# Patient Record
Sex: Female | Born: 1935 | Race: Asian | Hispanic: No | Smoking: Former smoker
Health system: Southern US, Community
[De-identification: ages and names within clinical notes are randomized; demographics above are authoritative.]

## PROBLEM LIST (undated history)

## (undated) DIAGNOSIS — I509 Heart failure, unspecified: Secondary | ICD-10-CM

## (undated) DIAGNOSIS — M810 Age-related osteoporosis without current pathological fracture: Secondary | ICD-10-CM

## (undated) DIAGNOSIS — I420 Dilated cardiomyopathy: Secondary | ICD-10-CM

## (undated) DIAGNOSIS — D649 Anemia, unspecified: Secondary | ICD-10-CM

## (undated) DIAGNOSIS — E039 Hypothyroidism, unspecified: Secondary | ICD-10-CM

## (undated) DIAGNOSIS — K219 Gastro-esophageal reflux disease without esophagitis: Secondary | ICD-10-CM

## (undated) DIAGNOSIS — E785 Hyperlipidemia, unspecified: Secondary | ICD-10-CM

## (undated) DIAGNOSIS — E119 Type 2 diabetes mellitus without complications: Secondary | ICD-10-CM

## (undated) DIAGNOSIS — I10 Essential (primary) hypertension: Secondary | ICD-10-CM

## (undated) DIAGNOSIS — N289 Disorder of kidney and ureter, unspecified: Secondary | ICD-10-CM

## (undated) HISTORY — PX: COLONOSCOPY: SHX174

## (undated) HISTORY — PX: TONSILLECTOMY: SUR1361

## (undated) HISTORY — PX: BUNIONECTOMY: SHX129

---

## 2004-07-29 ENCOUNTER — Ambulatory Visit: Payer: Self-pay | Admitting: Internal Medicine

## 2005-01-30 ENCOUNTER — Ambulatory Visit: Payer: Self-pay | Admitting: Internal Medicine

## 2005-02-03 ENCOUNTER — Ambulatory Visit: Payer: Self-pay | Admitting: Internal Medicine

## 2006-03-25 ENCOUNTER — Ambulatory Visit: Payer: Self-pay | Admitting: Internal Medicine

## 2006-07-30 ENCOUNTER — Emergency Department: Payer: Self-pay | Admitting: Emergency Medicine

## 2007-06-22 ENCOUNTER — Ambulatory Visit: Payer: Self-pay | Admitting: Internal Medicine

## 2008-06-22 ENCOUNTER — Ambulatory Visit: Payer: Self-pay | Admitting: Internal Medicine

## 2009-10-24 ENCOUNTER — Ambulatory Visit: Payer: Self-pay | Admitting: Internal Medicine

## 2009-11-07 ENCOUNTER — Ambulatory Visit: Payer: Self-pay | Admitting: Internal Medicine

## 2009-11-14 ENCOUNTER — Ambulatory Visit: Payer: Self-pay | Admitting: Internal Medicine

## 2010-06-06 ENCOUNTER — Emergency Department: Payer: Self-pay | Admitting: Emergency Medicine

## 2011-01-25 ENCOUNTER — Emergency Department: Payer: Self-pay | Admitting: Unknown Physician Specialty

## 2011-01-30 ENCOUNTER — Observation Stay: Payer: Self-pay | Admitting: Internal Medicine

## 2011-06-22 LAB — COMPREHENSIVE METABOLIC PANEL
Albumin: 4.5 g/dL (ref 3.4–5.0)
Alkaline Phosphatase: 44 U/L — ABNORMAL LOW (ref 50–136)
Anion Gap: 10 (ref 7–16)
BUN: 18 mg/dL (ref 7–18)
Bilirubin,Total: 0.8 mg/dL (ref 0.2–1.0)
Creatinine: 0.76 mg/dL (ref 0.60–1.30)
Glucose: 247 mg/dL — ABNORMAL HIGH (ref 65–99)
Osmolality: 286 (ref 275–301)
Potassium: 3.8 mmol/L (ref 3.5–5.1)
Sodium: 138 mmol/L (ref 136–145)
Total Protein: 8.3 g/dL — ABNORMAL HIGH (ref 6.4–8.2)

## 2011-06-22 LAB — CBC WITH DIFFERENTIAL/PLATELET
Basophil #: 0 10*3/uL (ref 0.0–0.1)
Eosinophil #: 0.2 10*3/uL (ref 0.0–0.7)
HGB: 15.5 g/dL (ref 12.0–16.0)
Lymphocyte %: 24.1 %
MCH: 32.5 pg (ref 26.0–34.0)
MCV: 94 fL (ref 80–100)
Monocyte #: 0.5 10*3/uL (ref 0.0–0.7)
Monocyte %: 7.9 %
Neutrophil #: 3.8 10*3/uL (ref 1.4–6.5)
Neutrophil %: 64.6 %
Platelet: 135 10*3/uL — ABNORMAL LOW (ref 150–440)
RBC: 4.78 10*6/uL (ref 3.80–5.20)
RDW: 13.1 % (ref 11.5–14.5)
WBC: 5.9 10*3/uL (ref 3.6–11.0)

## 2011-06-22 LAB — TROPONIN I
Troponin-I: <0.02 ng/mL
Troponin-I: 0.03 ng/mL

## 2011-06-22 LAB — URINALYSIS, COMPLETE
Blood: NEGATIVE
Ketone: NEGATIVE
Leukocyte Esterase: NEGATIVE
Nitrite: NEGATIVE
Ph: 7 (ref 4.5–8.0)
Protein: 100
Specific Gravity: 1.006 (ref 1.003–1.030)
WBC UR: 1 /HPF (ref 0–5)

## 2011-06-22 LAB — PRO B NATRIURETIC PEPTIDE: B-Type Natriuretic Peptide: 302 pg/mL (ref 0–450)

## 2011-06-23 ENCOUNTER — Inpatient Hospital Stay: Payer: Self-pay | Admitting: Internal Medicine

## 2011-06-23 LAB — CK TOTAL AND CKMB (NOT AT ARMC)
CK, Total: 56 U/L (ref 21–215)
CK, Total: 55 U/L (ref 21–215)
CK-MB: 0.9 ng/mL (ref 0.5–3.6)
CK-MB: 0.7 ng/mL (ref 0.5–3.6)

## 2011-06-23 LAB — TROPONIN I
Troponin-I: 0.02 ng/mL
Troponin-I: <0.02 ng/mL

## 2011-06-24 LAB — CBC WITH DIFFERENTIAL/PLATELET
Basophil #: 0 10*3/uL (ref 0.0–0.1)
Basophil %: 0.2 %
Eosinophil %: 2.5 %
HCT: 42.2 % (ref 35.0–47.0)
Lymphocyte #: 1.5 10*3/uL (ref 1.0–3.6)
MCH: 32.5 pg (ref 26.0–34.0)
MCHC: 34.5 g/dL (ref 32.0–36.0)
Monocyte #: 0.6 10*3/uL (ref 0.0–0.7)
Monocyte %: 9.4 %
Neutrophil #: 4.2 10*3/uL (ref 1.4–6.5)
Platelet: 111 10*3/uL — ABNORMAL LOW (ref 150–440)
RDW: 13.4 % (ref 11.5–14.5)
WBC: 6.5 10*3/uL (ref 3.6–11.0)

## 2011-06-24 LAB — BASIC METABOLIC PANEL
BUN: 18 mg/dL (ref 7–18)
Calcium, Total: 9.2 mg/dL (ref 8.5–10.1)
Creatinine: 0.66 mg/dL (ref 0.60–1.30)
EGFR (African American): >60
EGFR (Non-African Amer.): >60
Glucose: 208 mg/dL — ABNORMAL HIGH (ref 65–99)
Osmolality: 289 (ref 275–301)
Potassium: 3.9 mmol/L (ref 3.5–5.1)
Sodium: 141 mmol/L (ref 136–145)

## 2011-06-25 LAB — BASIC METABOLIC PANEL
Anion Gap: 10 (ref 7–16)
Calcium, Total: 9.5 mg/dL (ref 8.5–10.1)
Co2: 28 mmol/L (ref 21–32)
EGFR (African American): >60
EGFR (Non-African Amer.): >60
Glucose: 196 mg/dL — ABNORMAL HIGH (ref 65–99)
Osmolality: 289 (ref 275–301)
Sodium: 141 mmol/L (ref 136–145)

## 2011-06-25 LAB — CBC WITH DIFFERENTIAL/PLATELET
Basophil %: 0.3 %
Eosinophil #: 0.2 10*3/uL (ref 0.0–0.7)
Eosinophil %: 3.7 %
HCT: 41.1 % (ref 35.0–47.0)
Lymphocyte %: 37.3 %
MCHC: 33.9 g/dL (ref 32.0–36.0)
Monocyte %: 11.9 %
Neutrophil %: 46.8 %
RBC: 4.35 10*6/uL (ref 3.80–5.20)
RDW: 13.1 % (ref 11.5–14.5)
WBC: 4.8 10*3/uL (ref 3.6–11.0)

## 2011-09-01 ENCOUNTER — Ambulatory Visit: Payer: Self-pay | Admitting: Internal Medicine

## 2012-09-01 ENCOUNTER — Ambulatory Visit: Payer: Self-pay | Admitting: Internal Medicine

## 2013-02-19 ENCOUNTER — Inpatient Hospital Stay: Payer: Self-pay | Admitting: Internal Medicine

## 2013-02-19 LAB — CBC
HCT: 37.2 % (ref 35.0–47.0)
MCHC: 32.8 g/dL (ref 32.0–36.0)
MCV: 83 fL (ref 80–100)
RBC: 4.48 10*6/uL (ref 3.80–5.20)
WBC: 8.5 10*3/uL (ref 3.6–11.0)

## 2013-02-19 LAB — PRO B NATRIURETIC PEPTIDE: B-Type Natriuretic Peptide: 529 pg/mL — ABNORMAL HIGH (ref 0–450)

## 2013-02-19 LAB — COMPREHENSIVE METABOLIC PANEL
Albumin: 4.4 g/dL (ref 3.4–5.0)
Alkaline Phosphatase: 49 U/L — ABNORMAL LOW (ref 50–136)
BUN: 21 mg/dL — ABNORMAL HIGH (ref 7–18)
Calcium, Total: 9.1 mg/dL (ref 8.5–10.1)
Chloride: 105 mmol/L (ref 98–107)
Co2: 29 mmol/L (ref 21–32)
EGFR (African American): >60
EGFR (Non-African Amer.): >60
Potassium: 4.4 mmol/L (ref 3.5–5.1)
SGPT (ALT): 30 U/L (ref 12–78)

## 2013-02-19 LAB — TROPONIN I
Troponin-I: 0.21 ng/mL — ABNORMAL HIGH
Troponin-I: 0.12 ng/mL — ABNORMAL HIGH

## 2013-02-19 LAB — CK-MB
CK-MB: 1.9 ng/mL (ref 0.5–3.6)
CK-MB: 1.6 ng/mL (ref 0.5–3.6)

## 2013-02-20 LAB — BASIC METABOLIC PANEL
BUN: 19 mg/dL — ABNORMAL HIGH (ref 7–18)
Creatinine: 0.72 mg/dL (ref 0.60–1.30)
EGFR (Non-African Amer.): >60
Glucose: 135 mg/dL — ABNORMAL HIGH (ref 65–99)
Potassium: 3.8 mmol/L (ref 3.5–5.1)
Sodium: 138 mmol/L (ref 136–145)

## 2013-02-20 LAB — CBC WITH DIFFERENTIAL/PLATELET
Basophil #: 0 10*3/uL (ref 0.0–0.1)
Basophil %: 0.4 %
MCHC: 33.3 g/dL (ref 32.0–36.0)
Monocyte #: 0.4 x10 3/mm (ref 0.2–0.9)
Neutrophil #: 3.3 10*3/uL (ref 1.4–6.5)
Neutrophil %: 61.1 %
RBC: 4.05 10*6/uL (ref 3.80–5.20)
RDW: 15.2 % — ABNORMAL HIGH (ref 11.5–14.5)
WBC: 5.4 10*3/uL (ref 3.6–11.0)

## 2013-02-21 LAB — CBC WITH DIFFERENTIAL/PLATELET
Basophil #: 0 10*3/uL (ref 0.0–0.1)
Basophil %: 0.5 %
HGB: 10.8 g/dL — ABNORMAL LOW (ref 12.0–16.0)
Lymphocyte #: 1.6 10*3/uL (ref 1.0–3.6)
Lymphocyte %: 36.1 %
MCHC: 32.7 g/dL (ref 32.0–36.0)
MCV: 82 fL (ref 80–100)
Monocyte #: 0.5 x10 3/mm (ref 0.2–0.9)
Neutrophil %: 49 %
Platelet: 155 10*3/uL (ref 150–440)
RDW: 15.1 % — ABNORMAL HIGH (ref 11.5–14.5)
WBC: 4.4 10*3/uL (ref 3.6–11.0)

## 2013-02-21 LAB — BASIC METABOLIC PANEL
Calcium, Total: 8.8 mg/dL (ref 8.5–10.1)
Chloride: 106 mmol/L (ref 98–107)
Co2: 29 mmol/L (ref 21–32)
Creatinine: 0.69 mg/dL (ref 0.60–1.30)
EGFR (African American): >60
EGFR (Non-African Amer.): >60
Glucose: 133 mg/dL — ABNORMAL HIGH (ref 65–99)
Sodium: 140 mmol/L (ref 136–145)

## 2013-02-22 LAB — CBC WITH DIFFERENTIAL/PLATELET
Basophil #: 0 10*3/uL (ref 0.0–0.1)
Eosinophil #: 0.2 10*3/uL (ref 0.0–0.7)
Eosinophil %: 3.4 %
HCT: 34.8 % — ABNORMAL LOW (ref 35.0–47.0)
HGB: 11.5 g/dL — ABNORMAL LOW (ref 12.0–16.0)
MCH: 27.1 pg (ref 26.0–34.0)
WBC: 5 10*3/uL (ref 3.6–11.0)

## 2013-02-22 LAB — BASIC METABOLIC PANEL
BUN: 18 mg/dL (ref 7–18)
Calcium, Total: 9 mg/dL (ref 8.5–10.1)
Chloride: 104 mmol/L (ref 98–107)
Co2: 28 mmol/L (ref 21–32)
EGFR (Non-African Amer.): >60
Osmolality: 278 (ref 275–301)

## 2013-02-23 LAB — CBC WITH DIFFERENTIAL/PLATELET
Eosinophil #: 0.2 10*3/uL (ref 0.0–0.7)
Lymphocyte #: 2 10*3/uL (ref 1.0–3.6)
Lymphocyte %: 39.6 %
MCH: 27.9 pg (ref 26.0–34.0)
MCV: 81 fL (ref 80–100)
Monocyte #: 0.5 x10 3/mm (ref 0.2–0.9)
Neutrophil #: 2.2 10*3/uL (ref 1.4–6.5)
Neutrophil %: 45.4 %
Platelet: 152 10*3/uL (ref 150–440)
RBC: 4.44 10*6/uL (ref 3.80–5.20)

## 2013-02-23 LAB — BASIC METABOLIC PANEL
Anion Gap: 5 — ABNORMAL LOW (ref 7–16)
BUN: 25 mg/dL — ABNORMAL HIGH (ref 7–18)
Chloride: 103 mmol/L (ref 98–107)
Co2: 27 mmol/L (ref 21–32)
EGFR (African American): >60
EGFR (Non-African Amer.): >60
Glucose: 141 mg/dL — ABNORMAL HIGH (ref 65–99)
Potassium: 4.1 mmol/L (ref 3.5–5.1)
Sodium: 135 mmol/L — ABNORMAL LOW (ref 136–145)

## 2013-05-04 ENCOUNTER — Ambulatory Visit: Payer: Self-pay | Admitting: Gastroenterology

## 2013-05-05 LAB — PATHOLOGY REPORT

## 2013-09-05 ENCOUNTER — Ambulatory Visit: Payer: Self-pay | Admitting: Internal Medicine

## 2014-06-13 LAB — COMPREHENSIVE METABOLIC PANEL
ALBUMIN: 4.1 g/dL (ref 3.4–5.0)
AST: 38 U/L — AB (ref 15–37)
Alkaline Phosphatase: 35 U/L — ABNORMAL LOW
Anion Gap: 8 (ref 7–16)
BUN: 30 mg/dL — AB (ref 7–18)
Bilirubin,Total: 0.6 mg/dL (ref 0.2–1.0)
CO2: 28 mmol/L (ref 21–32)
Calcium, Total: 9.9 mg/dL (ref 8.5–10.1)
Chloride: 99 mmol/L (ref 98–107)
Creatinine: 1.11 mg/dL (ref 0.60–1.30)
GFR CALC NON AF AMER: 50 — AB
GLUCOSE: 43 mg/dL — AB (ref 65–99)
OSMOLALITY: 273 (ref 275–301)
Potassium: 3.8 mmol/L (ref 3.5–5.1)
SGPT (ALT): 25 U/L
SODIUM: 135 mmol/L — AB (ref 136–145)
Total Protein: 7.8 g/dL (ref 6.4–8.2)

## 2014-06-13 LAB — CBC WITH DIFFERENTIAL/PLATELET
Basophil #: 0 10*3/uL (ref 0.0–0.1)
Basophil %: 0.2 %
Eosinophil #: 0.1 10*3/uL (ref 0.0–0.7)
Eosinophil %: 1 %
HCT: 39.5 % (ref 35.0–47.0)
HGB: 13.1 g/dL (ref 12.0–16.0)
Lymphocyte #: 1.5 10*3/uL (ref 1.0–3.6)
Lymphocyte %: 14.1 %
MCH: 30.9 pg (ref 26.0–34.0)
MCHC: 33.1 g/dL (ref 32.0–36.0)
MCV: 93 fL (ref 80–100)
Monocyte #: 0.8 x10 3/mm (ref 0.2–0.9)
Monocyte %: 7.1 %
Neutrophil #: 8.3 10*3/uL — ABNORMAL HIGH (ref 1.4–6.5)
Neutrophil %: 77.6 %
Platelet: 194 10*3/uL (ref 150–440)
RBC: 4.23 10*6/uL (ref 3.80–5.20)
RDW: 13.5 % (ref 11.5–14.5)
WBC: 10.7 10*3/uL (ref 3.6–11.0)

## 2014-06-13 LAB — URINALYSIS, COMPLETE
BILIRUBIN, UR: NEGATIVE
BLOOD: NEGATIVE
KETONE: NEGATIVE
Nitrite: NEGATIVE
PH: 5 (ref 4.5–8.0)
PROTEIN: NEGATIVE
SPECIFIC GRAVITY: 1.008 (ref 1.003–1.030)

## 2014-06-14 ENCOUNTER — Observation Stay: Payer: Self-pay | Admitting: Internal Medicine

## 2014-06-15 LAB — CBC WITH DIFFERENTIAL/PLATELET
BASOS ABS: 0 10*3/uL (ref 0.0–0.1)
BASOS PCT: 0.3 %
Eosinophil #: 0.2 10*3/uL (ref 0.0–0.7)
Eosinophil %: 3.8 %
HCT: 40.1 % (ref 35.0–47.0)
HGB: 13.4 g/dL (ref 12.0–16.0)
LYMPHS ABS: 1.4 10*3/uL (ref 1.0–3.6)
LYMPHS PCT: 23.1 %
MCH: 31.4 pg (ref 26.0–34.0)
MCHC: 33.5 g/dL (ref 32.0–36.0)
MCV: 94 fL (ref 80–100)
MONO ABS: 0.6 x10 3/mm (ref 0.2–0.9)
MONOS PCT: 10.3 %
NEUTROS PCT: 62.5 %
Neutrophil #: 3.8 10*3/uL (ref 1.4–6.5)
PLATELETS: 206 10*3/uL (ref 150–440)
RBC: 4.28 10*6/uL (ref 3.80–5.20)
RDW: 13.3 % (ref 11.5–14.5)
WBC: 6 10*3/uL (ref 3.6–11.0)

## 2014-06-15 LAB — BASIC METABOLIC PANEL
ANION GAP: 4 — AB (ref 7–16)
BUN: 22 mg/dL — ABNORMAL HIGH (ref 7–18)
Calcium, Total: 9.5 mg/dL (ref 8.5–10.1)
Chloride: 101 mmol/L (ref 98–107)
Co2: 31 mmol/L (ref 21–32)
Creatinine: 0.85 mg/dL (ref 0.60–1.30)
EGFR (African American): >60
EGFR (Non-African Amer.): >60
GLUCOSE: 148 mg/dL — AB (ref 65–99)
OSMOLALITY: 278 (ref 275–301)
Potassium: 4.5 mmol/L (ref 3.5–5.1)
SODIUM: 136 mmol/L (ref 136–145)

## 2014-08-18 DIAGNOSIS — E119 Type 2 diabetes mellitus without complications: Secondary | ICD-10-CM

## 2014-10-06 NOTE — H&P (Signed)
PATIENT NAME:  Jacqueline, [name] MR#:  [id number] DATE OF BIRTH:  Nov 12, 1932  DATE OF ADMISSION:  02/19/2013  PRIMARY CARE PHYSICIAN:  Dr. Tracie Harrier.   REFERRING PHYSICIAN:  Dr. Lurline Hare.   HISTORY OF PRESENT ILLNESS:  Jacqueline [name] is a 81 year old pleasant female with a past medical history of congestive heart failure with EF of 40% to 45% by echocardiogram done in January 2013, diabetes mellitus, hypertension, is brought to the Emergency Department with sudden onset of shortness of breath.  Per daughter, the patient was doing well, even at 5:30, woke up at 11:30 at night with sudden onset of severe shortness of breath.  The patient states at that time experienced some chest pain.  The patient was diaphoretic.  Concerning this, the patient was brought to the Emergency Department.  At the time of EMS arrival the patient had oxygen saturations of 79%.  The patient was placed on nonrebreather and was brought in her.  In the Emergency Department, the patient was placed on BiPAP, was given one dose of Lasix with some improvement of the shortness of breath.  The patient states has eaten in the last two days some salty food and increased amount of fluid intake.  The patient has gained 3 pounds of weight in the last three days.  The patient's usual weight is 127.  Noted to have mild increased swelling around the face.   PAST MEDICAL HISTORY: 1.  Systolic congestive heart failure with EF of 40% to 45%.  2.  Hypertension.  3.  Hyperlipidemia.  4.  Diabetes mellitus.   5.  Vertigo.  6.  History of transaminitis.  7.  Accelerated hypertension.   PAST SURGICAL HISTORY:  1.  Tonsillectomy.  2.  Adenoidectomy.   3.  Bunion surgery from the left big toe.   ALLERGIES:  ERYTHROMYCIN, LOPID, PENICILLIN, CIPRO.   HOME MEDICATIONS: 1.  TriCor 145 mg once a day.  2.  Sertraline 50 mg once a day.  3.  Ramipril 10 mg 2 capsules once a day.  4.  Metformin 500 mg 2 times a day.  5.  Meclizine 25 mg 1/2 tablet  2 times a day.  6.  Lasix 20 mg once a day.  7.  Hydralazine 25 mg 2 times a day.  8.  Glipizide 10 mg once a day.  9.  Carvedilol 12.5 mg 2 times a day.  10.  Aspirin 81 mg once a day.   SOCIAL HISTORY:  Smoked in the past, quit 20 years back.  Denies drinking alcohol or using illicit drugs.  Lives by herself.  Has poor functional status.   FAMILY HISTORY:  The patient's parents died at an early age, does not know about the family history.   REVIEW OF SYSTEMS: CONSTITUTIONAL:  Has generalized weakness.  EYES:  No change in vision.  EARS:  No change in hearing. RESPIRATORY:  Denies having any cough.  Has shortness of breath.  CARDIOVASCULAR:  Has shortness of breath.   GASTROINTESTINAL:  No nausea, vomiting, abdominal pain.  GENITOURINARY:  No dysuria or hematuria.  HEMATOLOGIC:  No easy bruising and bleeding.  ENDOCRINE:  Carries a diagnosis of diabetes mellitus.  SKIN:  No rashes or lesions.  MUSCULOSKELETAL:  No joint pains and aches.   NEUROLOGIC:  No weakness or numbness in any part of the body.   PHYSICAL EXAMINATION: GENERAL:  This is a well-built, well-nourished, age-appropriate female lying down in the bed on BiPAP.  VITAL SIGNS:  Temperature  97.3, pulse 77, blood pressure 138/62, respiratory rate of 26, oxygen saturation 100% on BiPAP.  HEENT:  Head normocephalic, atraumatic.  Eyes, no scleral icterus.  Conjunctivae normal.  Pupils equal and react to light.  Extraocular movements are intact.  Mucous membranes moist.  NECK:  Supple.  No lymphadenopathy.  No JVD.  No carotid bruit.  No thyromegaly.  CHEST:  Has no focal tenderness, a few crackles are heard bibasilar, decreased breath sounds.  HEART:  S1, S2 regular.  No murmurs are heard.  ABDOMEN:  Bowel sounds plus.  Soft, nontender, nondistended.  EXTREMITIES:  No pedal edema.  Pulses 2+.  SKIN:  No rash or lesions.  MUSCULOSKELETAL:  Good range of motion in all the extremities.  NEUROLOGIC:  The patient is alert,  oriented to place, person and time.  Cranial nerves II through XII intact.  No motor and sensory deficits.   LABORATORY DATA:  EKG, 12-lead:  Sinus tachycardia, first degree AV block, nonspecific ST-T wave abnormalities.   Chest x-ray consistent with congestive heart failure.   CMP is completely within normal limits.  Initial troponin is less than 0.02.  BNP 529.  ASSESSMENT AND PLAN:  Jacqueline [name] is a 81 year old female who comes to the Emergency Department with sudden onset of shortness of breath.  1.  Congestive heart failure, systolic, acute on chronic, most likely from fluid overload.  Continue with BiPAP.  Continue with the Lasix IV.  Continue with the metoprolol and the Ramipril.  2.  Chest pain.  We will rule out with cardiac enzymes x 3.  No previous history of heart attacks.  3.  Diabetes mellitus.  We will hold the metformin.  Continue with sliding scale insulin.  4.  Hypertension, currently well-controlled.  Continue the home medications.  5.  Keep the patient on DVT prophylaxis with Lovenox.   TIME SPENT:  45 minutes.    ____________________________ Monica Becton, MD pv:ea D: 02/19/2013 02:25:52 ET T: 02/19/2013 03:15:30 ET JOB#: [id number]  cc: Monica Becton, MD, <Dictator> Tracie Harrier, MD Monica Becton MD ELECTRONICALLY SIGNED 02/20/2013 20:33

## 2014-10-06 NOTE — Consult Note (Signed)
PATIENT NAME:  Jacqueline [name], Jacqueline [name] MR#:  [id number] DATE OF BIRTH:  1932-08-01  DATE OF CONSULTATION:  02/19/2013  REFERRING PHYSICIAN:  Leonie Douglas. Doy Hutching, MD CONSULTING PHYSICIAN:  Corey Skains, MD  REASON FOR CONSULTATION: Acute on chronic congestive heart failure, hypertension and hyperlipidemia.   CHIEF COMPLAINT: "I got short of breath."   HISTORY OF PRESENT ILLNESS: This is a 81 year old female with known chronic LV systolic dysfunction, on appropriate medication management, including beta blocker, ACE inhibitor and Lasix. The patient recently has had an echocardiogram showing moderate global LV systolic dysfunction with ejection fraction of 30%. She now has severe shortness of breath, pulmonary edema, lower extremity edema and acute on chronic systolic dysfunction congestive heart failure. She was given intravenous Lasix, which has helped a great deal, and she now has improvements of these symptoms. Blood pressure and heart rate are stable. She has no evidence of current myocardial infarction, with a normal troponin and CK-MB. EKG shows no evidence of myocardial infarction.   REVIEW OF SYSTEMS: The remainder of review of systems negative for vision change, ringing in the ears, hearing loss, cough, congestion, heartburn, nausea, vomiting, diarrhea, bloody stools, stomach pain, extremity pain, leg weakness, cramping of the buttocks, known blood clots, headaches, blackouts, dizzy spells, nosebleeds, congestion, trouble swallowing, frequent urination, urination at night, muscle weakness, numbness, anxiety, depression, skin lesions or skin rashes.   PAST MEDICAL HISTORY:  1. Dilated cardiomyopathy. 2. Cardiac catheterization with normal coronary arteries.  3. Hypertension.   FAMILY HISTORY: No family members with early onset of cardiovascular disease.   SOCIAL HISTORY: Currently denies alcohol or tobacco use.   ALLERGIES: As listed.   MEDICATIONS: As listed.   PHYSICAL EXAMINATION:   VITAL SIGNS: Blood pressure 110/68 bilaterally, heart rate 72 upright, reclining and regular.  GENERAL: She is a well-appearing female in no acute distress.  HEAD, EYES, EARS, NOSE AND THROAT: No icterus, thyromegaly, ulcers, hemorrhage or xanthelasma.  CARDIOVASCULAR: Regular rate and rhythm with normal S1 and S2, with a 2/6 apical murmur consistent with mitral regurgitation. PMI is diffuse. Carotid upstroke normal without bruits. Jugular venous pressure is normal.  LUNGS: Have a few basilar crackles with normal respirations.  ABDOMEN: Soft, nontender, without hepatosplenomegaly or masses. Abdominal aorta is normal size without bruit.  EXTREMITIES: Show 2+ bilateral pulses in dorsal, pedal, radial and femoral arteries, with 1+ lower extremity edema. No cyanosis, clubbing or ulcers.  NEUROLOGICAL: She is oriented to time, place and person with normal mood and affect.   ASSESSMENT: A 81 year old female with acute on chronic systolic dysfunction, congestive heart failure, pulmonary edema, hypertension, needing further treatment options.   RECOMMENDATIONS:  1. Intravenous Lasix.  2. Consider repeat echocardiogram as necessary for further evaluation of significant acute on chronic congestive heart failure.  3. No change in current outpatient medication management, which has been appropriate. 4. Further evaluation of dietary habits, possibly causing symptoms listed above.  5. Ambulate and further adjustments of medication.     ____________________________ Corey Skains, MD bjk:OSi D: 02/19/2013 09:10:24 ET T: 02/19/2013 09:24:58 ET JOB#: [id number]  cc: Corey Skains, MD, <Dictator> Corey Skains MD ELECTRONICALLY SIGNED 02/23/2013 14:27

## 2014-10-06 NOTE — Discharge Summary (Signed)
PATIENT NAME:  Jacqueline [name], TINO MR#:  [id number] DATE OF BIRTH:  20-Sep-1932  DATE OF ADMISSION:  02/19/2013 DATE OF DISCHARGE:  02/23/2013  DISCHARGE DIAGNOSES:  1.  Acute shortness of breath secondary to acute systolic congestive heart failure.  2.  Type 2 diabetes.  3.  Hypertension.  4.  Hyperlipidemia.  5.  History of vertigo.   CHIEF COMPLAINT: Shortness of breath.   HISTORY OF PRESENT ILLNESS: Myrla Malanowski is a 81 year old female with a past medical history of CHF, ejection fraction 40% to 45% by echocardiogram in 06/2011, history of type 2 diabetes and hypertension, who presented the ED complaining of acute onset of shortness of breath. The patient also had been experiencing some chest pain and was noted to be diaphoretic and O2 sats were down to 79%. The patient was placed on nonrebreather oxygen and brought to the Emergency Room. In the ED, she was placed on BiPAP and also given IV Lasix and appeared to improve her symptoms.   PAST MEDICAL HISTORY: Significant for CHF, ejection fraction 40% to 45%, history of hypertension, hyperlipidemia, diabetes, vertigo, history of transaminitis and history of accelerated hypertension.   PHYSICAL EXAMINATION:  GENERAL: She was well built, well-nourished initially on BiPAP. VITAL SIGNS: Temperature 97.3, pulse 77, blood pressure 138/62, respirations 26, oxygen saturation 100% on BiPAP.  HEENT: NCAT.  NECK: Supple. No JVD.  HEART: S1, S2, regular rhythm.  ABDOMEN: Soft, nontender.  EXTREMITIES: 2+ edema.  NEUROLOGIC: Nonfocal.   EKG showed sinus tachycardia with first degree AV block and nonspecific ST-T abnormalities. Chest x-ray was consistent with CHF.   HOSPITAL COURSE: The patient was admitted to [facility] and placed on intravenous Lasix. She was also seen in consultation by cardiologist, Dr. Nehemiah Massed and an echocardiogram was performed. Echo showed evidence of left ventricular ejection fraction of 25% to 30% by visual estimation, moderately  decreased global LV systolic function, moderate LVH, moderately dilated left atrium, moderately dilated right atrium, moderate to severe mitral regurgitation and mildly increased LV internal cavity size with mild aortic regurgitation and moderate tricuspid regurgitation. During her stay in the hospital, the patient was also placed on oxygen and subsequently seen by physical therapy. She was gradually ambulated and symptomatically she improved. Her leg edema also improved significantly and she was discharged in stable condition on the following medications: 1.  Aspirin 81 mg a day. 2.  Carvedilol 12.5 mg b.i.d.  3.  Furosemide 40 mg b.i.d.  4.  KCl 10 mEq once a day. 5.  Hydralazine 25 mg b.i.d.  6.  Glipizide 10 mg once a day. 7.  Sertraline 50 mg once a day. 8.  Avapro 10 mg once a day. 9.  Meclizine 25 mg 1/2 tablet b.i.d. p.r.n.  10. Metformin 500 mg b.i.d.  11. TriCor 145 mg once a day.   The patient is advised a low sodium diet and advised to have a CBC and metabolic panel in 1 week and to follow with me, Dr. Ginette Pitman, in 1 to 2 weeks' time.  [place] was arranged for pt.  TOTAL TIME SPENT DISCHARGING THIS PATIENT: 35 minutes.  ____________________________ Tracie Harrier, MD vh:aw D: 02/24/2013 13:25:32 ET T: 02/24/2013 13:34:57 ET JOB#: [id number]  cc: Tracie Harrier, MD, <Dictator> Tracie Harrier MD ELECTRONICALLY SIGNED 03/03/2013 18:25

## 2014-10-08 NOTE — Discharge Summary (Signed)
PATIENT NAME:  Jacqueline [name], Jacqueline [name] MR#:  [id number] DATE OF BIRTH:  Jun 09, 1933  DATE OF ADMISSION:  06/23/2011 DATE OF DISCHARGE:  06/25/2011  DIAGNOSES AT TIME OF DISCHARGE:  1. Atypical chest pain, myocardial infarction ruled out with negative Myoview stress test.  2. Accelerated hypertension.  3. Dizziness.  4. Hypoxemia with congestive heart failure.  5. Type 2 diabetes.   CHIEF COMPLAINT: Dizziness, chest pain, shortness of breath, diaphoresis.   HISTORY OF PRESENT ILLNESS: Emmett Arntz is a 81 year old female with history of systolic dysfunction, congestive heart failure, hypertension, hyperlipidemia, diabetes, chronic vertigo presented with dizziness after she had attended church. She also described severe chest pain associated with shortness of breath and related the pain is 7/10. Patient was noted to be hypoxemic with an oxygen saturation of 88% to 90% on room air with an elevated blood pressure of 222/107. Patient stated that she had been eating some beef jerking but denied any syncopal episode.   PAST MEDICAL HISTORY:  1. Systolic dysfunction, ejection fraction of 40%. 2. Hypertension. 3. Hyperlipidemia. 4. Type 2 diabetes. 5. History of previous urinary tract infection.  6. History of chronic vertigo. 7. History of previous tonsillectomy, adenoidectomy. 8. Bunion surgery left big toe.   PHYSICAL EXAMINATION: GENERAL: She is afebrile, pulse 85, respirations 26, blood pressure initially 205/81, repeat blood pressure 150/55, oxygen saturation 97% on 2 liters nasal cannula. GENERAL: She was not in distress. HEENT: Normocephalic, atraumatic. NECK: Supple. No adenopathy. HEART: S1, S2. LUNGS: Faint bibasilar crackles. No use of accessory muscles of respiration. ABDOMEN: Soft, nontender. EXTREMITIES: No edema. NEUROLOGIC: Nonfocal.   LABORATORY, DIAGNOSTIC AND RADIOLOGICAL DATA: Troponin less than 0.02. Chest x-ray showed borderline cardiomegaly, pulmonary vascular congestion, possible  fibrosis of the right lung base with atelectasis and minimal edema. CK 93, MB 1.1, WBC 5.9, hemoglobin 15.5, hematocrit 44.8, platelets 135, MCV 94, glucose 247, BUN 18, creatinine 0.76, sodium 138, potassium 3.8, chloride 101, CO2 27. D-dimer 0.35. EKG shows sinus with heart rate of 72 with first-degree A-V block with no ST-T changes.   HOSPITAL COURSE: Patient was admitted to [facility]. She underwent a Myoview stress test that was negative and Doppler echocardiogram showed LVEF 40% to 45% with moderate to severe aortic regurgitation, mild to moderate MR and TR. She was started on hydralazine for blood pressure control and she was also seen by physical therapy and ambulated. Her dizziness gradually resolved. Blood pressure is also better controlled. She was ruled out for myocardial infarction and appeared stable at time of discharge.   DISCHARGE MEDICATIONS: Patient was discharged home on the following medications.  1. Hydralazine 25 mg p.o. t.i.d.  2. Meclizine 25 mg p.o. b.i.d. p.r.n.  3. Glipizide 10 mg once a day.  4. Carvedilol 12.5 mg b.i.d.  5. Aspirin 81 mg a day.  6. Metformin 500 mg p.o. b.i.d.  7. Ramipril 10 mg once a day.  8. Furosemide 40 mg once a day.  9. Sertraline 50 mg a day. 10. TriCor 145 mg a day. 11. Digoxin 125 mcg a day.   FOLLOW UP: Patient has been advised to follow with me, Dr. Ginette Pitman, 1 to 2 weeks' time.   ____________________________ Tracie Harrier, MD vh:cms D: 06/30/2011 18:04:42 ET T: 07/02/2011 07:21:59 ET JOB#: [id number] cc: Tracie Harrier, MD, <Dictator> Tracie Harrier MD ELECTRONICALLY SIGNED 07/31/2011 12:34

## 2014-10-08 NOTE — H&P (Signed)
PATIENT NAME:  Jacqueline Jacqueline [name], Jacqueline [name] MR#:  [id number] DATE OF BIRTH:  1932/08/23  DATE OF ADMISSION:  06/22/2011  REFERRING PHYSICIAN: Dr. Cinda Quest  PRIMARY CARE PHYSICIAN: Dr. Ginette Pitman    PRESENTING COMPLAINT: Dizziness, chest pain, shortness of breath, diaphoresis.   HISTORY OF PRESENT ILLNESS: Jacqueline Jacqueline [name] Jacqueline [name] is a 81 year old woman with history of systolic dysfunction, congestive heart failure, hypertension, hyperlipidemia, diabetes, and chronic vertigo who presents with complaints of developing dizziness this evening after church after laying down. She had also associated severe chest pain and difficulty breathing. She reports the pain was mainly on the left side, a heavy sensation, 7 out of 10. Jacqueline Jacqueline [name] Jacqueline [name] checked Jacqueline pulse oximetry and found that she was 88 to 90% on room air and Jacqueline blood pressure was 222/107 and that is why she contacted EMS. On arrival here, Jacqueline blood pressure was noted to be 205/81. The patient is currently on oxygen 2 liters sating around 96%. She currently denies any chest pain or shortness of breath. No palpitations. She reports no dizziness while laying but when she gets up still feels dizzy. The patient also reports noncompliance with salt intake. She has been eating [place] as well as beef jerky. The patient denies any syncopal episodes.   PAST MEDICAL HISTORY:  1. Systolic dysfunction, congestive heart failure. Per documentation, last EF was 40%.  2. Hypertension.  3. Hyperlipidemia.  4. Diabetes diagnosed in 2003.  5. History of urinary tract infection sepsis.  6. Vertigo.  7. History of transaminitis.  8. Admitted 08/16 to 02/02/2011 with accelerated hypertension and vertigo. MRI scan of the brain was negative at that time.   PAST SURGICAL HISTORY:  1. Tonsillectomy.  2. Adenoidectomy. 3. Bunion surgery from left big toe.   ALLERGIES: Erythromycin, Lopid, penicillin, Cipro.   MEDICATIONS:  1. Aspirin 81 mg daily.  2. Carvedilol 12.5 mg b.i.d.  3. Digoxin 125 mcg  daily.  4. Furosemide 20 mg daily.  5. Glipizide 10 mg daily.  6. Hydralazine 25 mg b.i.d.  7. Meclizine 12.5 mg b.i.d.  8. Metformin 500 mg b.i.d.  9. Ramipril 20 mg daily.  10. Sertraline 50 mg daily.  11. TriCor 145 mg daily   FAMILY HISTORY: The patient is unaware of family history. Jacqueline parents died at an early age.   SOCIAL HISTORY: She lives in [place] with Jacqueline husband. Denies any alcohol. Quit tobacco greater than 20 years ago.   REVIEW OF SYSTEMS: CONSTITUTIONAL: She endorses hot flashes for the past three years. No weight changes. EYES: No glaucoma or cataracts. ENT: No ear pain or epistaxis. She reports dizziness as per history of present illness. RESPIRATORY: No cough, wheezing, hemoptysis. She reports shortness of breath with the chest pain. CARDIOVASCULAR: As per history of present illness. GI: No nausea, vomiting, diarrhea, abdominal pain, hematemesis. GU: No dysuria or hematuria. ENDOCRINE: No polyuria or polydipsia. HEME: No easy bleeding. SKIN: No ulcers. MUSCULOSKELETAL: No neck pain or joint pain. NEUROLOGIC: No one-sided weakness or numbness. PSYCH: Denies any depression or suicidal ideation.   PHYSICAL EXAMINATION  VITAL SIGNS: Temperature 96, pulse 85, respiratory rate 26, blood pressure initially 205/81, current blood pressure 150/55, sating 97% on 2 liters.   GENERAL: Lying in bed in no apparent distress.   HEENT: Normocephalic, atraumatic. Pupils equal, symmetric, nonicteric. No nystagmus. Nasal cannula in place. Dry mucous membrane.   NECK: Soft and supple. No adenopathy or JVP.   CARDIOVASCULAR: Non-tachy. No murmurs, rubs, or gallops.   LUNGS: Faint basilar crackles. No use of  accessory muscles or increased respiratory effort.   ABDOMEN: Soft, nontender. Positive bowel sounds. No mass appreciated.   EXTREMITIES: No edema. Dorsal pedis pulses intact.   MUSCULOSKELETAL: No joint effusion.   SKIN: No ulcers.   NEUROLOGIC: No dysarthria or aphasia.  Symmetrical strength. No focal deficits. Nose-to-finger intact. No pronator drift.   PSYCH: She is alert and oriented. The patient is cooperative. History is obtained with the help of Jacqueline Jacqueline [name] Jacqueline [name].   PERTINENT LABS AND STUDIES: Initial troponin less than 0.02. Repeat slightly more elevated at 0.03 but normal. Chest x-ray with borderline cardiomegaly and pulmonary vascular congestion with probable fibrosis, right lung base atelectasis and possible minimal edema. CK 93. MB 1.1. WBC 5.9, hemoglobin 15.5, hematocrit 44.8, platelets 135, MCV 94, glucose 247, BUN 18, creatinine 0.76, sodium 138, potassium 3.8, chloride 101, carbon dioxide 27, calcium 9.1, total bilirubin 0.8, alkaline phosphatase 44, ALT 34, AST 42, total protein 8.3. D-dimer 0.35. TSH 2.72. BNP 302. Urinalysis with specific gravity of 1.006, pH 7, protein 100 mg/dL. EKG with sinus rate of 72, first degree AV block. No ST changes.   ASSESSMENT AND PLAN: Jacqueline Jacqueline [name] is a 81 year old woman with history of hypertension, congestive heart failure, systolic dysfunction, hyperlipidemia, diabetes, and vertigo presenting with dizziness, chest pain, shortness of breath, diaphoresis, and hypoxia.  1. Chest pain, atypical, likely in the setting of hypertension, accelerated. Continue Jacqueline on tele. Cycle cardiac enzymes. Will send for a Myoview. Will also obtain an echocardiogram. I cannot find the most recent echocardiogram done here or on [facility] files. Will restart Jacqueline aspirin, Coreg, and TriCor. Continue oxygen. Morphine as needed. Nitroglycerin sublingual as needed.  2. Hypertension, accelerated, improved, likely culprit for all of Jacqueline presenting issues. She endorses noncompliance with salt intake. Restart Jacqueline ramipril, Lasix, Coreg, and hydralazine.  3. Diastolic dysfunction congestive heart failure. Jacqueline BNP was within normal limits. Chest x-ray result as above. 4. Hypoxia likely in the setting of flash edema for hypertensive emergency. Continue  oxygen, Lasix IV, digoxin, Coreg, ACE inhibitor, and hydralazine. Jacqueline digoxin is within normal limits. Continue I's and O's and daily weights. As above, repeat Jacqueline echo and will obtain ambulatory sats.  5. Acute on chronic vertigo as above likely exacerbated by hypertension. Restart Jacqueline meclizine b.i.d. as well as as needed. Obtain PT evaluation. She has had an MRI when she was admitted back in August of 2012 that was unrevealing for the vertigo.   6. Diabetes. Start sliding scale insulin. Restart Jacqueline glipizide. Hold metformin.  7. Prophylaxis. On Lovenox, aspirin, and Protonix.   TIME SPENT: Approximately 50 minutes spent on patient care.   ____________________________ Rita Ohara, MD ap:drc D: 06/23/2011 01:22:22 ET T: 06/23/2011 07:19:08 ET JOB#: [id number]  cc: Brien Few Alante Tolan, MD, <Dictator> Tracie Harrier, MD Rita Ohara MD ELECTRONICALLY SIGNED 07/05/2011 2:25

## 2014-10-11 NOTE — H&P (Signed)
PATIENT NAME:  Jacqueline [name], Jacqueline [name] MR#:  [id number] DATE OF BIRTH:  1933-05-21  DATE OF ADMISSION:  06/14/2014  REFERRING PHYSICIAN:  Debbrah Alar, MD  PRIMARY CARE PHYSICIAN:  Tracie Harrier, MD, of [facility] clinic.  ADMITTING PHYSICIAN:  Juluis Mire, MD   CHIEF COMPLAINT:  Low blood sugars in the 30s.   HISTORY OF PRESENT ILLNESS:  This is a 81 year old Caucasian female with a past medical history of diabetes mellitus type 2, hypertension, hyperlipidemia, systolic congestive heart failure, depression, and Barrett esophagitis, who presents to the Emergency Room via EMS with the complaints of low blood sugars in the 30s. According to the patient's daughter, who is with the patient at this time, she received a call from her mother stating that her blood sugar was running low in the 30s; hence, she called EMS. EMS went and checked her blood sugar, and it was in the 30s, following which they gave her some oral juice and the blood sugars improved to 70, but she continued to have low sugars, hence, was brought to the Emergency Room for further evaluation.   In the Emergency Room, the patient was evaluated by the ED physician and was found to have recurrent low blood sugars, hence, was started on D5, and further lab workup revealed no abnormality except for a significant urinary tract infection. In view of her persistent low blood sugars, the hospitalist service was consulted for further evaluation and management. The patient is currently receiving D10, and her blood sugars are running between 80 to 100 mg%, and she is alert, awake, and oriented with stable vital signs. In view of her persistent low blood sugars and a UTI, the patient is admitted for observation.   No history of any fever, cough, chest pain, shortness of breath, nausea, vomiting, diarrhea, abdominal pain, or urinary symptoms in the recent past. She did have some episodes of some diarrhea about a week ago, but now her bowel movements are  regular at this time.    PAST MEDICAL HISTORY:  1.  Diabetes mellitus type 2.  2.  Hypertension.  3.  Hyperlipidemia.  4.  Systolic congestive heart failure.  5.  Depression.  6.  Barrett esophagitis.   PAST SURGICAL HISTORY:  1.  Tonsillectomy and adenoidectomy.  2.  Bunion surgery for the left big toe.   ALLERGIES:  ERYTHROMYCIN, LOPID, PENICILLIN, CIPRO.   SOCIAL HISTORY:  She is single, lives at home, and her son lives with her. Smoked in the past, quit about 20 years ago. Denies any history of alcohol or substance abuse.    FAMILY HISTORY:  The patient's parents died at an early age, does not know about the family history. No history of diabetes, heart problems, or hypertension in other family members.    HOME MEDICATIONS:  1.  Aspirin 81 mg tablet 1 tablet orally once a day.  2.  Carvedilol 12.5 mg tablet 1 tablet orally once a day.  3.  Furosemide 40 mg tablet 1 tablet orally 2 times a day.  4.  Multivitamin 1 tablet orally once a day.  5.  Glipizide 10 mg tablet 1 tablet orally once a day.  6.  Levothyroxine 25 mcg tablet 1 tablet orally once a day.  7.  Meclizine 25mg  tablet orally 2 times a day as needed.  8.  Metformin 500 mg 1 tablet orally 2 times a day.  9.  Potassium chloride 10 mEq tablet extended-release 1 tablet orally once a day.  10.  Protonix 40 mg tablet 1 tablet orally once a day.  11. Ramipril10 mg tablet 2 capsules orally once a day.  12.  Sertraline 50 mg tablet orally 1 tablet orally once a day.  13.  TriCor 145 mg tablet orally 1 tablet orally once a day.  14.  Vitamin D2, 50,000 units 1 capsule orally once a week.   REVIEW OF SYSTEMS: CONSTITUTIONAL:  Negative for fever, fatigue, or generalized weakness.  EYES:   Negative for blurred vision or double vision. No pain. No redness. No inflammation. EARS, NOSE, AND THROAT:   Negative for tinnitus, ear pain, hearing loss, nasal discharge, or difficulty swallowing.  RESPIRATORY:   Negative for cough,  wheezing, hemoptysis, dyspnea, or painful respirations.  CARDIOVASCULAR:   Negative for chest pain, orthopnea, pedal edema, dyspnea on exertion, palpitations, syncope, or loss of consciousness.  GASTROINTESTINAL:  Negative for nausea, vomiting, diarrhea, abdominal pain, hematemesis, GERD symptoms, or melena.  GENITOURINARY:  Negative for dysuria, hematuria, frequency, or urgency.  ENDOCRINE:  Negative for polyuria. No heat or cold intolerance. The patient had low blood sugars as noted in the history of present illness.  HEMATOLOGIC:  Negative for anemia or easy bruising or bleeding.  INTEGUMENTARY:  Negative for acne, skin rash, or lesions.  MUSCULOSKELETAL:  Negative for arthritis, joint swelling, and gout.  NEUROLOGICAL:  Negative for focal weakness or numbness. No history of CVA, TIA, or seizure disorder.  PSYCHIATRIC:  Negative for anxiety or insomnia. Positive history for depression, for which she takes medication and is under control.   PHYSICAL EXAMINATION: VITAL SIGNS:  Temperature 98 degrees Fahrenheit, pulse rate 85 per minute, respirations 15 per minute, blood pressure 161/72, oxygen saturation 97% on room air.  GENERAL:  Well-nourished, well-developed lady, alert, awake, and oriented, in no acute distress, pleasant and cooperative.  HEAD:  Atraumatic, normocephalic.  EYES:  Pupils are equal and react to light and accommodation. No conjunctival pallor. No scleral icterus. Extraocular motions are intact.  NOSE:  No nasal lesions. No drainage.  EARS:  No drainage. No external lesions.  ORAL CAVITY:  No mucosal lesions. No exudates.  NECK:  Supple. No JVD. No thyromegaly. No carotid bruit. Range of motion of neck is normal.  RESPIRATORY:  Good respiratory effort. Not using accessory muscles of respiration. Bilateral vesicular breath sounds present. No rales or rhonchi.  CARDIOVASCULAR:  S1, S2 regular. No murmurs appreciated. Peripheral pulses equal at carotid, femoral, and pedal pulses.  No peripheral edema.  GASTROINTESTINAL:  Abdomen is soft and nontender. No hepatosplenomegaly. Bowel sounds present and equal in all 4 quadrants. No rebound. No guarding. No rigidity.  GENITOURINARY:  Deferred.  MUSCULOSKELETAL:  Normal gait and station. Range of motion is normal in all areas. Strength and tone are equal bilaterally.  SKIN:  Inspection within normal limits.  LYMPHATIC:  No cervical lymphadenopathy.  VASCULAR:  Good dorsalis pedis and posterior tibial pulses.  NEUROLOGICAL:  Alert, awake, and oriented x 3. Cranial nerves II through XII are grossly intact. DTRs are 2+ bilaterally and symmetrical. Strength is 5/5 in both upper and lower extremities.  PSYCHIATRIC:  Judgment and insight are adequate. Alert and oriented x 3. Memory and mood within normal limits.   LABORATORY DATA:  Serum glucose 43, BUN 30, creatinine 1.1, sodium 135, potassium 3.8, chloride 99, bicarbonate 28, total calcium 9.9, total protein 7.8, serum albumin 4.1, total bilirubin 0.6, alkaline phosphatase 35, AST 38, and ALT is 25. WBC is 10.7, hemoglobin 13.1, hematocrit 39.5, platelet count 194,000. Urinalysis:  15 WBCs, 2+ bacteria.   ASSESSMENT AND PLAN:  This is a 81 year old female with past medical history of hypertension, diabetes mellitus type 2, hyperlipidemia, and systolic congestive heart failure, who presents with hypoglycemia with blood sugars of 32. The patient continued to have persistent hypoglycemia in the Emergency Room and was also found to have a UTI and was admitted for observation for further management.   1.  Hypoglycemia, which is persistent. The patient is alert, awake, and oriented. Vital signs are stable. Plan:  Admit for observation. We will give D5 and check blood sugars hourly and hold home medications for diabetes and sliding scale insulin.  2.  Urinary tract infection. Received fosfomycin in the Emergency Room since the patient is allergic to multiple medications. Urine culture and  sensitivity was sent. Follow up urine cultures.  3.  Hypertension, controlled with home medications.  4.  Diabetes mellitus type 2, on home medications, currently hold off on home medications for now because of hypoglycemia. Monitor with sliding scale insulin and restart home medications accordingly.  5.  Hyperlipidemia, stable on home medications.  6.  Depression, stable on home medications. Continue same.  7.  Systolic congestive heart failure, well compensated. The patient is stable. Continue home medications.  8.  History of Barrett esophagitis, stable on proton pump inhibitor. Continue same.  9.  Deep vein thrombosis prophylaxis with subcutaneous Lovenox.  10.  Gastrointestinal prophylaxis with proton pump inhibitor.   CODE STATUS:   Full code.    TIME SPENT:   55 minutes.     ____________________________ Juluis Mire, MD enr:nb D: 06/14/2014 03:20:32 ET T: 06/14/2014 04:25:37 ET JOB#: [id number]  cc: Juluis Mire, MD, <Dictator> Tracie Harrier, MD Juluis Mire MD ELECTRONICALLY SIGNED 06/16/2014 19:01

## 2014-10-11 NOTE — Discharge Summary (Signed)
PATIENT NAME:  Jacqueline [name], Jacqueline [name] MR#:  [id number] DATE OF BIRTH:  02-12-1933  DATE OF ADMISSION:  06/14/2014 DATE OF DISCHARGE:  06/15/2014  DISCHARGE DIAGNOSES:  1.  Hypoglycemia.  2.  Urinary tract infection.  3.  Hypertension.  4.  Type 2 diabetes.  5.  Hyperlipidemia.  6.  Depression.  7.  History of systolic congestive heart failure.   CHIEF COMPLAINT: Low blood sugars.   HISTORY OF PRESENT ILLNESS: Jacqueline [name] is a 81 year old female with past medical history of type 2 diabetes, hypertension, hyperlipidemia and systolic CHF who presents to the Emergency Room via EMS after she was noted to have low sugars in the 30s. The patient was given orange juice and blood sugars improved to 70 but continued to have low sugars in the ED. She was started on D5 and was also noted to have evidence for a urinary tract infection, received a dose of fosfomycin. The patient reportedly had an episode of diarrhea about a week back. Denies any fevers, chills. No chest pain or shortness of breath. No vomiting. Please see H and P for other details.   HOSPITAL COURSE: The patient was admitted to [facility] and received D5. Her sugars gradually improved and her medications including metformin and glipizide were held while in the hospital. Sugars gradually came up to 231 and was 178 on day of discharge. She did not have any dysuria. Urinalysis did show 15 WBCs per high power field, but was negative for nitrites and trace leukocyte esterase. The patient's appetite also picked up and she was stable at the time of discharge. She was discharged home on the following medications.   DISCHARGE MEDICATIONS: TriCor 145 one tablet once a day, metformin 500 mg p.o. b.i.d., meclizine 25 mg p.o. b.i.d., sertraline 50 mg once a day, KCl 10 mEq once a day, furosemide 40 mg p.o. b.i.d., carvedilol 12.5 mg b.i.d., aspirin 81 mg a day, ramipril 10 mg 2 capsules once a day, fusion plus vitamin B complex with C 1 capsule once a day, levothyroxine  25 mcg once a day, vitamin D2 50,000 units once a week and Protonix 40 mg once a day.   DISCHARGE INSTRUCTIONS: She has been advised to hold her glipizide for now and continue to monitor her sugars at home and advised to call back with any questions or concerns. She has been advised to follow with me, Dr. Ginette Pitman, in 1 to 2 weeks' time. The patient is stable at the time of discharge.   TOTAL TIME SPENT DISCHARGING THE PATIENT: 35 minutes.  ____________________________ Tracie Harrier, MD vh:sb D: 06/15/2014 12:38:33 ET T: 06/15/2014 14:45:19 ET JOB#: [id number]  cc: Tracie Harrier, MD, <Dictator> Tracie Harrier MD ELECTRONICALLY SIGNED 06/28/2014 13:04

## 2015-08-20 DIAGNOSIS — H3530 Unspecified macular degeneration: Secondary | ICD-10-CM | POA: Insufficient documentation

## 2016-07-21 ENCOUNTER — Encounter: Payer: Self-pay | Admitting: *Deleted

## 2016-07-22 ENCOUNTER — Ambulatory Visit
Admission: RE | Admit: 2016-07-22 | Discharge: 2016-07-22 | Disposition: A | Discharge status: Home / Self Care | Payer: Medicare Other | Source: Ambulatory Visit | Attending: Gastroenterology | Admitting: Gastroenterology

## 2016-07-22 ENCOUNTER — Encounter
Admission: RE | Disposition: A | Discharge status: Home / Self Care | Payer: Self-pay | Source: Ambulatory Visit | Attending: Gastroenterology

## 2016-07-22 DIAGNOSIS — Z539 Procedure and treatment not carried out, unspecified reason: Secondary | ICD-10-CM | POA: Insufficient documentation

## 2016-07-22 HISTORY — DX: Dilated cardiomyopathy: I42.0

## 2016-07-22 HISTORY — DX: Essential (primary) hypertension: I10

## 2016-07-22 HISTORY — DX: Type 2 diabetes mellitus without complications: E11.9

## 2016-07-22 HISTORY — DX: Hypothyroidism, unspecified: E03.9

## 2016-07-22 HISTORY — DX: Hyperlipidemia, unspecified: E78.5

## 2016-07-22 HISTORY — DX: Gastro-esophageal reflux disease without esophagitis: K21.9

## 2016-07-22 SURGERY — ESOPHAGOGASTRODUODENOSCOPY (EGD) WITH PROPOFOL
Anesthesia: General

## 2016-07-22 MED ORDER — MIDAZOLAM HCL 2 MG/2ML IJ SOLN
INTRAMUSCULAR | Status: AC
  Filled 2016-07-22: qty 2

## 2016-07-22 MED ORDER — FENTANYL CITRATE (PF) 100 MCG/2ML IJ SOLN
INTRAMUSCULAR | Status: AC
  Filled 2016-07-22: qty 2

## 2016-07-22 MED ORDER — PROPOFOL 500 MG/50ML IV EMUL
INTRAVENOUS | Status: AC
  Filled 2016-07-22: qty 50

## 2016-07-22 NOTE — H&P (Signed)
Patient arrived to endoscopy unit for procedure.  Unfortunately is being treated for UTI, and had fever of over 101.  Procedure cancelled, will have office call patient to reschedule.

## 2017-01-12 DIAGNOSIS — R600 Localized edema: Secondary | ICD-10-CM | POA: Insufficient documentation

## 2017-02-05 DIAGNOSIS — I5022 Chronic systolic (congestive) heart failure: Secondary | ICD-10-CM | POA: Diagnosis present

## 2017-02-05 DIAGNOSIS — I5023 Acute on chronic systolic (congestive) heart failure: Secondary | ICD-10-CM | POA: Diagnosis present

## 2017-02-05 DIAGNOSIS — I5033 Acute on chronic diastolic (congestive) heart failure: Secondary | ICD-10-CM | POA: Diagnosis present

## 2017-02-18 ENCOUNTER — Observation Stay
Admission: EM | Admit: 2017-02-18 | Discharge: 2017-02-20 | Disposition: A | Discharge status: Home / Self Care | Payer: Medicare Other | Attending: Internal Medicine | Admitting: Internal Medicine

## 2017-02-18 DIAGNOSIS — Z888 Allergy status to other drugs, medicaments and biological substances status: Secondary | ICD-10-CM | POA: Diagnosis not present

## 2017-02-18 DIAGNOSIS — E039 Hypothyroidism, unspecified: Secondary | ICD-10-CM | POA: Diagnosis not present

## 2017-02-18 DIAGNOSIS — E785 Hyperlipidemia, unspecified: Secondary | ICD-10-CM | POA: Insufficient documentation

## 2017-02-18 DIAGNOSIS — Z7982 Long term (current) use of aspirin: Secondary | ICD-10-CM | POA: Diagnosis not present

## 2017-02-18 DIAGNOSIS — K219 Gastro-esophageal reflux disease without esophagitis: Secondary | ICD-10-CM | POA: Insufficient documentation

## 2017-02-18 DIAGNOSIS — Z7984 Long term (current) use of oral hypoglycemic drugs: Secondary | ICD-10-CM | POA: Diagnosis not present

## 2017-02-18 DIAGNOSIS — Z87891 Personal history of nicotine dependence: Secondary | ICD-10-CM | POA: Insufficient documentation

## 2017-02-18 DIAGNOSIS — Z880 Allergy status to penicillin: Secondary | ICD-10-CM | POA: Diagnosis not present

## 2017-02-18 DIAGNOSIS — I129 Hypertensive chronic kidney disease with stage 1 through stage 4 chronic kidney disease, or unspecified chronic kidney disease: Secondary | ICD-10-CM | POA: Insufficient documentation

## 2017-02-18 DIAGNOSIS — R42 Dizziness and giddiness: Secondary | ICD-10-CM

## 2017-02-18 DIAGNOSIS — E1122 Type 2 diabetes mellitus with diabetic chronic kidney disease: Secondary | ICD-10-CM | POA: Diagnosis not present

## 2017-02-18 DIAGNOSIS — R2689 Other abnormalities of gait and mobility: Secondary | ICD-10-CM | POA: Insufficient documentation

## 2017-02-18 DIAGNOSIS — E860 Dehydration: Secondary | ICD-10-CM | POA: Diagnosis not present

## 2017-02-18 DIAGNOSIS — Z79899 Other long term (current) drug therapy: Secondary | ICD-10-CM | POA: Insufficient documentation

## 2017-02-18 DIAGNOSIS — I951 Orthostatic hypotension: Secondary | ICD-10-CM | POA: Insufficient documentation

## 2017-02-18 DIAGNOSIS — M6281 Muscle weakness (generalized): Secondary | ICD-10-CM | POA: Insufficient documentation

## 2017-02-18 DIAGNOSIS — N179 Acute kidney failure, unspecified: Secondary | ICD-10-CM | POA: Insufficient documentation

## 2017-02-18 DIAGNOSIS — Z881 Allergy status to other antibiotic agents status: Secondary | ICD-10-CM | POA: Diagnosis not present

## 2017-02-18 DIAGNOSIS — N183 Chronic kidney disease, stage 3 (moderate): Secondary | ICD-10-CM | POA: Insufficient documentation

## 2017-02-18 DIAGNOSIS — I420 Dilated cardiomyopathy: Secondary | ICD-10-CM | POA: Diagnosis not present

## 2017-02-18 DIAGNOSIS — R531 Weakness: Secondary | ICD-10-CM

## 2017-02-18 DIAGNOSIS — Z8249 Family history of ischemic heart disease and other diseases of the circulatory system: Secondary | ICD-10-CM | POA: Diagnosis not present

## 2017-02-18 HISTORY — DX: Dizziness and giddiness: R42

## 2017-02-18 LAB — CBC
HCT: 35.8 % (ref 35.0–47.0)
HEMOGLOBIN: 12.2 g/dL (ref 12.0–16.0)
MCH: 31.5 pg (ref 26.0–34.0)
MCHC: 34.1 g/dL (ref 32.0–36.0)
MCV: 92.3 fL (ref 80.0–100.0)
Platelets: 158 10*3/uL (ref 150–440)
RBC: 3.88 MIL/uL (ref 3.80–5.20)
RDW: 14.2 % (ref 11.5–14.5)
WBC: 3.8 10*3/uL (ref 3.6–11.0)

## 2017-02-18 LAB — GLUCOSE, CAPILLARY
GLUCOSE-CAPILLARY: 96 mg/dL (ref 65–99)
Glucose-Capillary: 102 mg/dL — ABNORMAL HIGH (ref 65–99)
Glucose-Capillary: 110 mg/dL — ABNORMAL HIGH (ref 65–99)

## 2017-02-18 LAB — BASIC METABOLIC PANEL
ANION GAP: 9 (ref 5–15)
Anion gap: 7 (ref 5–15)
BUN: 79 mg/dL — ABNORMAL HIGH (ref 6–20)
BUN: 74 mg/dL — ABNORMAL HIGH (ref 6–20)
CALCIUM: 9.6 mg/dL (ref 8.9–10.3)
CO2: 23 mmol/L (ref 22–32)
CO2: 23 mmol/L (ref 22–32)
CREATININE: 1.96 mg/dL — AB (ref 0.44–1.00)
Calcium: 8.9 mg/dL (ref 8.9–10.3)
Chloride: 101 mmol/L (ref 101–111)
Chloride: 103 mmol/L (ref 101–111)
Creatinine, Ser: 1.99 mg/dL — ABNORMAL HIGH (ref 0.44–1.00)
GFR calc Af Amer: 26 mL/min — ABNORMAL LOW (ref >60)
GFR calc non Af Amer: 23 mL/min — ABNORMAL LOW (ref >60)
GFR, EST AFRICAN AMERICAN: 26 mL/min — AB (ref >60)
GFR, EST NON AFRICAN AMERICAN: 22 mL/min — AB (ref >60)
GLUCOSE: 119 mg/dL — AB (ref 65–99)
Glucose, Bld: 100 mg/dL — ABNORMAL HIGH (ref 65–99)
Potassium: 4.8 mmol/L (ref 3.5–5.1)
Potassium: 4.7 mmol/L (ref 3.5–5.1)
Sodium: 133 mmol/L — ABNORMAL LOW (ref 135–145)
Sodium: 133 mmol/L — ABNORMAL LOW (ref 135–145)

## 2017-02-18 LAB — URINALYSIS, COMPLETE (UACMP) WITH MICROSCOPIC
BILIRUBIN URINE: NEGATIVE
Bacteria, UA: NONE SEEN
Glucose, UA: NEGATIVE mg/dL
HGB URINE DIPSTICK: NEGATIVE
KETONES UR: NEGATIVE mg/dL
LEUKOCYTES UA: NEGATIVE
NITRITE: NEGATIVE
PROTEIN: NEGATIVE mg/dL
RBC / HPF: NONE SEEN RBC/hpf (ref 0–5)
Specific Gravity, Urine: 1.009 (ref 1.005–1.030)
pH: 5 (ref 5.0–8.0)

## 2017-02-18 LAB — TROPONIN I: Troponin I: 0.04 ng/mL (ref <0.03)

## 2017-02-18 MED ORDER — ACETAMINOPHEN 325 MG PO TABS
650.0000 mg | ORAL_TABLET | Freq: Four times a day (QID) | ORAL | Status: DC | PRN
  Administered 2017-02-19: 05:00:00 650 mg via ORAL
  Filled 2017-02-18: qty 2

## 2017-02-18 MED ORDER — ONDANSETRON HCL 4 MG/2ML IJ SOLN: 4.0000 mg | Freq: Four times a day (QID) | INTRAMUSCULAR | Status: DC | PRN

## 2017-02-18 MED ORDER — PANTOPRAZOLE SODIUM 40 MG PO TBEC
40.0000 mg | DELAYED_RELEASE_TABLET | Freq: Every day | ORAL | Status: DC
  Administered 2017-02-19 – 2017-02-20 (×2): 40 mg via ORAL
  Filled 2017-02-18 (×2): qty 1

## 2017-02-18 MED ORDER — SODIUM CHLORIDE 0.9 % IV SOLN
Freq: Once | INTRAVENOUS | Status: AC
  Administered 2017-02-18: 13:00:00 via INTRAVENOUS

## 2017-02-18 MED ORDER — FENOFIBRATE 160 MG PO TABS
160.0000 mg | ORAL_TABLET | Freq: Every day | ORAL | Status: DC
  Administered 2017-02-19 – 2017-02-20 (×2): 160 mg via ORAL
  Filled 2017-02-18 (×2): qty 1

## 2017-02-18 MED ORDER — POTASSIUM CHLORIDE CRYS ER 10 MEQ PO TBCR
10.0000 meq | EXTENDED_RELEASE_TABLET | Freq: Every day | ORAL | Status: DC
  Administered 2017-02-19 – 2017-02-20 (×2): 10 meq via ORAL
  Filled 2017-02-18 (×3): qty 1

## 2017-02-18 MED ORDER — INSULIN ASPART 100 UNIT/ML ~~LOC~~ SOLN: 0.0000 [IU] | Freq: Three times a day (TID) | SUBCUTANEOUS | Status: DC

## 2017-02-18 MED ORDER — ONDANSETRON HCL 4 MG PO TABS: 4.0000 mg | ORAL_TABLET | Freq: Four times a day (QID) | ORAL | Status: DC | PRN

## 2017-02-18 MED ORDER — RAMIPRIL 10 MG PO CAPS
10.0000 mg | ORAL_CAPSULE | Freq: Every day | ORAL | Status: DC
  Administered 2017-02-19 – 2017-02-20 (×2): 10 mg via ORAL
  Filled 2017-02-18 (×2): qty 1

## 2017-02-18 MED ORDER — ENOXAPARIN SODIUM 40 MG/0.4ML ~~LOC~~ SOLN
30.0000 mg | SUBCUTANEOUS | Status: DC
  Administered 2017-02-18 – 2017-02-19 (×2): 30 mg via SUBCUTANEOUS
  Filled 2017-02-18 (×2): qty 0.4

## 2017-02-18 MED ORDER — SODIUM CHLORIDE 0.9 % IV SOLN
INTRAVENOUS | Status: AC
  Administered 2017-02-18: 18:00:00 via INTRAVENOUS

## 2017-02-18 MED ORDER — SERTRALINE HCL 50 MG PO TABS
50.0000 mg | ORAL_TABLET | Freq: Every day | ORAL | Status: DC
  Administered 2017-02-19 – 2017-02-20 (×2): 50 mg via ORAL
  Filled 2017-02-18 (×2): qty 1

## 2017-02-18 MED ORDER — CARVEDILOL 6.25 MG PO TABS
6.2500 mg | ORAL_TABLET | Freq: Two times a day (BID) | ORAL | Status: DC
  Administered 2017-02-18 – 2017-02-20 (×4): 6.25 mg via ORAL
  Filled 2017-02-18: qty 2
  Filled 2017-02-18 (×5): qty 1
  Filled 2017-02-18 (×2): qty 2

## 2017-02-18 MED ORDER — ASPIRIN EC 81 MG PO TBEC
81.0000 mg | DELAYED_RELEASE_TABLET | Freq: Every day | ORAL | Status: DC
Start: 2017-02-19 — End: 2017-02-20
  Administered 2017-02-19 – 2017-02-20 (×2): 81 mg via ORAL
  Filled 2017-02-18 (×2): qty 1

## 2017-02-18 MED ORDER — POLYETHYLENE GLYCOL 3350 17 G PO PACK: 17.0000 g | PACK | Freq: Every day | ORAL | Status: DC | PRN

## 2017-02-18 MED ORDER — LEVOTHYROXINE SODIUM 25 MCG PO TABS
25.0000 ug | ORAL_TABLET | Freq: Every day | ORAL | Status: DC
  Administered 2017-02-19 – 2017-02-20 (×2): 25 ug via ORAL
  Filled 2017-02-18 (×2): qty 1

## 2017-02-18 MED ORDER — ACETAMINOPHEN 650 MG RE SUPP: 650.0000 mg | Freq: Four times a day (QID) | RECTAL | Status: DC | PRN

## 2017-02-18 NOTE — ED Triage Notes (Signed)
Pt arrives from home via [facility] for generalized weakness. States woke up this morning and felt dizziness. EMS reports pt has felt like this before but not this bad. Pt is stating central chest hurts but will not give a number to pain. Pt took her own BP at home and was low. FD told EMS 120, EMS had 98/53. FD told EMS CBG WNL. EMS reports pt recently started on Spironolactone from cardiologist. No distress noted at this time.

## 2017-02-18 NOTE — ED Provider Notes (Addendum)
[facility] Emergency Department Provider Note       Time seen: ----------------------------------------- 12:43 PM on 02/18/2017 -----------------------------------------     I have reviewed the triage vital signs and the nursing notes.   HISTORY   Chief Complaint Weakness    HPI Jacqueline [name] is a 83 y.o. female who presents to the ED for neurologic weakness. Patient states she woke up this morning and felt dizziness. She reports she's felt like this before but not this bad. Initially she had described some chest pain but does not complain of any now. She took her own blood pressure at home and it was noted to be low. EMS reports a blood pressure around midnight and the systolic. She denies any recent illness, was recently placed on spironolactone from the cardiologist.   Past Medical History:  Diagnosis Date  . Diabetes mellitus without complication ([place])   . Dilated idiopathic cardiomyopathy ([place])   . GERD (gastroesophageal reflux disease)   . Hyperlipidemia   . Hypertension   . Hypothyroidism     There are no active problems to display for this patient.   Past Surgical History:  Procedure Laterality Date  . BUNIONECTOMY    . COLONOSCOPY    . TONSILLECTOMY      Allergies Ciprofloxacin; Digoxin and related; Erythromycin; Lopid [gemfibrozil]; Nitrofurantoin; Penicillins; and Cefuroxime  Social History Social History  Substance Use Topics  . Smoking status: Former Research scientist (life sciences)  . Smokeless tobacco: Never Used  . Alcohol use Not on file    Review of Systems Constitutional: Negative for fever. Cardiovascular: Negative for chest pain. Respiratory: Negative for shortness of breath. Gastrointestinal: Negative for abdominal pain, vomiting and diarrhea. Genitourinary: Negative for dysuria. Musculoskeletal: Negative for back pain. Skin: Negative for rash. Neurological: Negative for headaches, Positive for weakness  All systems  negative/normal/unremarkable except as stated in the HPI  ____________________________________________   PHYSICAL EXAM:  VITAL SIGNS: ED Triage Vitals  Enc Vitals Group     BP --      Pulse --      Resp --      Temp 02/18/17 1205 97.9 F (36.6 C)     Temp Source 02/18/17 1205 Oral     SpO2 --      Weight 02/18/17 1205 125 lb (56.7 kg)     Height 02/18/17 1205 5\' 1"  (1.549 m)     Head Circumference --      Peak Flow --      Pain Score 02/18/17 1204 0     Pain Loc --      Pain Edu? --      Excl. in [place]? --     Constitutional: Alert and oriented. Well appearing and in no distress. Eyes: Conjunctivae are normal. Normal extraocular movements. ENT   Head: Normocephalic and atraumatic.   Nose: No congestion/rhinnorhea.   Mouth/Throat: Mucous membranes are moist.   Neck: No stridor. Cardiovascular: Normal rate, regular rhythm. No murmurs, rubs, or gallops. Respiratory: Normal respiratory effort without tachypnea nor retractions. Breath sounds are clear and equal bilaterally. No wheezes/rales/rhonchi. Gastrointestinal: Soft and nontender. Normal bowel sounds Musculoskeletal: Nontender with normal range of motion in extremities. No lower extremity tenderness nor edema. Neurologic:  Normal speech and language. No gross focal neurologic deficits are appreciated.  Skin:  Skin is warm, dry and intact. No rash noted. Psychiatric: Mood and affect are normal. Speech and behavior are normal.  ____________________________________________  EKG: Interpreted by me. Sinus rhythm rate of 74 bpm, prolonged PR  interval, normal QRS, normal QT.  ____________________________________________  ED COURSE:  Pertinent labs & imaging results that were available during my care of the patient were reviewed by me and considered in my medical decision making (see chart for details). Patient presents for weakness and hypotension, we will assess with labs and imaging as indicated. Patient  received IV fluid bolus. Clinical Course as of Feb 18 1400  Wed Feb 18, 2017  1341 Patient does appear dehydrated and is receiving IV fluids.  [JW]    Clinical Course User Index [JW] Earleen Newport, MD   Procedures ____________________________________________   LABS (pertinent positives/negatives)  Labs Reviewed  BASIC METABOLIC PANEL - Abnormal; Notable for the following:       Result Value   Sodium 133 (*)    Glucose, Bld 100 (*)    BUN 79 (*)    Creatinine, Ser 1.99 (*)    GFR calc non Af Amer 22 (*)    GFR calc Af Amer 26 (*)    All other components within normal limits  URINALYSIS, COMPLETE (UACMP) WITH MICROSCOPIC - Abnormal; Notable for the following:    Color, Urine YELLOW (*)    APPearance HAZY (*)    Squamous Epithelial / LPF 0-5 (*)    All other components within normal limits  TROPONIN I - Abnormal; Notable for the following:    Troponin I 0.04 (*)    All other components within normal limits  BASIC METABOLIC PANEL - Abnormal; Notable for the following:    Sodium 133 (*)    Glucose, Bld 119 (*)    BUN 74 (*)    Creatinine, Ser 1.96 (*)    GFR calc non Af Amer 23 (*)    GFR calc Af Amer 26 (*)    All other components within normal limits  CBC  GLUCOSE, CAPILLARY  CBG MONITORING, ED  ____________________________________________  FINAL ASSESSMENT AND PLAN  Weakness, Dehydration, hypotension  Plan: Patient's labs and imaging were dictated above. Patient had presented for weakness and was found to be dehydrated with resulting low blood pressure. She was given IV fluids and was able to ambulate without any difficulty with improved blood pressure. This is likely somewhat related to her recently starting spironolactone which we will have them hold. Patient states she does not feel well and up to go home. After a liter of fluids her BUN and creatinine are not changed very much. We will discuss with the hospitalist.   Earleen Newport, MD   Note:  This note was generated in part or whole with voice recognition software. Voice recognition is usually quite accurate but there are transcription errors that can and very often do occur. I apologize for any typographical errors that were not detected and corrected.     Earleen Newport, MD 02/18/17 1402    Earleen Newport, MD 02/18/17 1512    Earleen Newport, MD 02/18/17 [phone]

## 2017-02-18 NOTE — ED Notes (Signed)
Date and time results received: 02/18/17 1348   Test: troponin Critical Value: 0.04  Name of Provider Notified: Dr. Jimmye Norman

## 2017-02-18 NOTE — H&P (Signed)
[place] [facility] [place] NAME: Jacqueline [name]    MR#:  [id number]  DATE OF BIRTH:  1933/04/12  DATE OF ADMISSION:  02/18/2017  PRIMARY CARE PHYSICIAN: Tracie Harrier, MD   REQUESTING/REFERRING PHYSICIAN: Dr. Jimmye Norman  CHIEF COMPLAINT:   Chief Complaint  Patient presents with  . Weakness    HISTORY OF PRESENT ILLNESS:  Jacqueline [name]  is a 83 y.o. female with a known history of dilated cardiomyopathy with ejection fraction 20%, hypertension, CKD stage III presents to the emergency room complaining of weakness and dizziness since morning. Also her blood pressure that she checked at home was 70/50. Patient was recently started on Aldactone by her cardiologist Dr. Nehemiah Massed. Since then she has had some dizziness. Today in the emergency room her creatinine is elevated to 1.99 while baseline creatinine is 1.4. Also orthostatic blood pressure was positive.she has received 1 L normal saline and continues to feel weak and dizzy. No focal neurological deficits. Ambulates with a walker at baseline.  PAST MEDICAL HISTORY:   Past Medical History:  Diagnosis Date  . Diabetes mellitus without complication ([place])   . Dilated idiopathic cardiomyopathy ([place])   . GERD (gastroesophageal reflux disease)   . Hyperlipidemia   . Hypertension   . Hypothyroidism     PAST SURGICAL HISTORY:   Past Surgical History:  Procedure Laterality Date  . BUNIONECTOMY    . COLONOSCOPY    . TONSILLECTOMY      SOCIAL HISTORY:   Social History  Substance Use Topics  . Smoking status: Former Research scientist (life sciences)  . Smokeless tobacco: Never Used  . Alcohol use Not on file    FAMILY HISTORY:   Family History  Problem Relation Age of Onset  . Premature CHD Brother     DRUG ALLERGIES:   Allergies  Allergen Reactions  . Ciprofloxacin   . Digoxin And Related   . Erythromycin   . Lopid [Gemfibrozil]   . Nitrofurantoin   . Penicillins   . Cefuroxime Rash    REVIEW OF SYSTEMS:    Review of Systems  Constitutional: Positive for malaise/fatigue. Negative for chills, fever and weight loss.  HENT: Negative for hearing loss and nosebleeds.   Eyes: Negative for blurred vision, double vision and pain.  Respiratory: Negative for cough, hemoptysis, sputum production, shortness of breath and wheezing.   Cardiovascular: Negative for chest pain, palpitations, orthopnea and leg swelling.  Gastrointestinal: Negative for abdominal pain, constipation, diarrhea, nausea and vomiting.  Genitourinary: Negative for dysuria and hematuria.  Musculoskeletal: Negative for back pain, falls and myalgias.  Skin: Negative for rash.  Neurological: Positive for dizziness, weakness and headaches. Negative for tremors, sensory change, speech change, focal weakness and seizures.  Endo/Heme/Allergies: Does not bruise/bleed easily.  Psychiatric/Behavioral: Negative for depression and memory loss. The patient is not nervous/anxious.     MEDICATIONS AT HOME:   Prior to Admission medications   Medication Sig Start Date End Date Taking? Authorizing Provider  aspirin EC 81 MG tablet Take 81 mg by mouth daily.   Yes [provider]  carvedilol (COREG) 12.5 MG tablet Take 12.5 mg by mouth 2 (two) times daily with a meal.   Yes [provider]  ergocalciferol (VITAMIN D2) 50000 units capsule Take 50,000 Units by mouth once a week.   Yes [provider]  fenofibrate (TRICOR) 145 MG tablet Take 145 mg by mouth daily.   Yes [provider]  furosemide (LASIX) 40 MG tablet Take 20-40 mg by  mouth.    Yes [provider]  Iron-FA-B Cmp-C-Biot-Probiotic (FUSION PLUS) CAPS Take by mouth.   Yes [provider]  levothyroxine (SYNTHROID, LEVOTHROID) 25 MCG tablet Take 25 mcg by mouth daily before breakfast.   Yes [provider]  pantoprazole (PROTONIX) 40 MG tablet Take 40 mg by mouth daily.   Yes [provider]  potassium chloride (K-DUR)  10 MEQ tablet Take 10 mEq by mouth daily.   Yes [provider]  ramipril (ALTACE) 10 MG capsule Take 10 mg by mouth daily.   Yes [provider]  sertraline (ZOLOFT) 50 MG tablet Take 50 mg by mouth daily.   Yes [provider]  spironolactone (ALDACTONE) 25 MG tablet Take 25 mg by mouth daily.   Yes [provider]  metFORMIN (GLUCOPHAGE) 500 MG tablet Take by mouth 2 (two) times daily with a meal.    [provider]     VITAL SIGNS:  Blood pressure 129/61, pulse 76, temperature 97.9 F (36.6 C), temperature source Oral, resp. rate (!) 22, height 5\' 1"  (1.549 m), weight 56.7 kg (125 lb), SpO2 94 %.  PHYSICAL EXAMINATION:  Physical Exam  GENERAL:  83 y.o.-year-old patient lying in the bed with no acute distress.  EYES: Pupils equal, round, reactive to light and accommodation. No scleral icterus. Extraocular muscles intact.  HEENT: Head atraumatic, normocephalic. Oropharynx and nasopharynx clear. No oropharyngeal erythema, moist oral mucosa  NECK:  Supple, no jugular venous distention. No thyroid enlargement, no tenderness.  LUNGS: Normal breath sounds bilaterally, no wheezing, rales, rhonchi. No use of accessory muscles of respiration.  CARDIOVASCULAR: S1, S2 normal. No murmurs, rubs, or gallops.  ABDOMEN: Soft, nontender, nondistended. Bowel sounds present. No organomegaly or mass.  EXTREMITIES: No pedal edema, cyanosis, or clubbing. + 2 pedal & radial pulses b/l.   NEUROLOGIC: Cranial nerves II through XII are intact. No focal Motor or sensory deficits appreciated b/l PSYCHIATRIC: The patient is alert and oriented x 3. Good affect.  SKIN: No obvious rash, lesion, or ulcer.   LABORATORY PANEL:   CBC  Recent Labs Lab 02/18/17 1205  WBC 3.8  HGB 12.2  HCT 35.8  PLT 158   ------------------------------------------------------------------------------------------------------------------  Chemistries   Recent Labs Lab 02/18/17 1403   NA 133*  K 4.7  CL 103  CO2 23  GLUCOSE 119*  BUN 74*  CREATININE 1.96*  CALCIUM 8.9   ------------------------------------------------------------------------------------------------------------------  Cardiac Enzymes  Recent Labs Lab 02/18/17 1205  TROPONINI 0.04*   ------------------------------------------------------------------------------------------------------------------  RADIOLOGY:  No results found.   IMPRESSION AND PLAN:   * Acute kidney injury over CKD stage III due to dehydration and spinal lactone. Likely ATN from hypotension. Start IV fluids. Monitor closely for fluid overload. Repeat labs in the morning. Check input and output.  * Hypertension. Patient was hypotensive earlier. We will hold Aldactone. Reduce dose of Altace and Coreg.  * Nonischemic cardiomyopathy. Continue Coreg and Altace. Hold Lasix due to acute kidney injury.  * Dizziness due to orthostatic hypotension from dehydration. On IV fluids.  All the records are reviewed and case discussed with ED provider. Management plans discussed with the patient, family and they are in agreement.  CODE STATUS: FULL CODE  TOTAL TIME TAKING CARE OF THIS PATIENT: 40 minutes.   Hillary Bow R M.D on 02/18/2017 at 4:21 PM  Between 7am to 6pm - Pager - [phone]  After 6pm go to [email or web address] - password [email or web address]  Office  [phone]  CC: Primary care physician; Tracie Harrier, MD  Note: This dictation was prepared with Dragon dictation along with smaller phrase technology. Any transcriptional errors that result from this process are unintentional.

## 2017-02-18 NOTE — ED Notes (Signed)
Admitting at bedside 

## 2017-02-18 NOTE — ED Notes (Signed)
Pt ambulatory to toilet with slow steady gait and 1 assist. Pt stated she was dizzy. Pt urinated and then had a small brown BM.

## 2017-02-19 DIAGNOSIS — E860 Dehydration: Secondary | ICD-10-CM | POA: Diagnosis not present

## 2017-02-19 LAB — BASIC METABOLIC PANEL
ANION GAP: 7 (ref 5–15)
BUN: 60 mg/dL — ABNORMAL HIGH (ref 6–20)
CHLORIDE: 105 mmol/L (ref 101–111)
CO2: 26 mmol/L (ref 22–32)
Calcium: 9 mg/dL (ref 8.9–10.3)
Creatinine, Ser: 1.53 mg/dL — ABNORMAL HIGH (ref 0.44–1.00)
GFR calc non Af Amer: 31 mL/min — ABNORMAL LOW (ref >60)
GFR, EST AFRICAN AMERICAN: 35 mL/min — AB (ref >60)
Glucose, Bld: 94 mg/dL (ref 65–99)
Potassium: 4.3 mmol/L (ref 3.5–5.1)
Sodium: 138 mmol/L (ref 135–145)

## 2017-02-19 LAB — GLUCOSE, CAPILLARY
GLUCOSE-CAPILLARY: 87 mg/dL (ref 65–99)
GLUCOSE-CAPILLARY: 109 mg/dL — AB (ref 65–99)
GLUCOSE-CAPILLARY: 130 mg/dL — AB (ref 65–99)
Glucose-Capillary: 84 mg/dL (ref 65–99)

## 2017-02-19 LAB — CBC
HCT: 31.4 % — ABNORMAL LOW (ref 35.0–47.0)
HEMOGLOBIN: 11.1 g/dL — AB (ref 12.0–16.0)
MCH: 32.3 pg (ref 26.0–34.0)
MCHC: 35.4 g/dL (ref 32.0–36.0)
MCV: 91.3 fL (ref 80.0–100.0)
PLATELETS: 149 10*3/uL — AB (ref 150–440)
RBC: 3.44 MIL/uL — AB (ref 3.80–5.20)
RDW: 14.4 % (ref 11.5–14.5)
WBC: 3.2 10*3/uL — AB (ref 3.6–11.0)

## 2017-02-19 MED ORDER — SODIUM CHLORIDE 0.9 % IV SOLN
INTRAVENOUS | Status: DC
  Administered 2017-02-19: 16:00:00 via INTRAVENOUS

## 2017-02-19 NOTE — Care Management Obs Status (Signed)
[facility] NOTIFICATION   Patient Details  Name: Jacqueline [name] MRN: [id number] Date of Birth: 05/26/1933   Medicare Observation Status Notification Given:  Yes  Lupita Raider signed   Shelbie Ammons, RN 02/19/2017, 2:54 PM

## 2017-02-19 NOTE — Progress Notes (Signed)
[place] [facility] [place] NAME: Jacqueline [name]    MR#:  [id number]  DATE OF BIRTH:  1932-09-16  SUBJECTIVE:  CHIEF COMPLAINT:   Chief Complaint  Patient presents with  . Weakness   somewhat better, getting hydrated with IV fluids REVIEW OF SYSTEMS:  Review of Systems  Constitutional: Positive for malaise/fatigue. Negative for chills, fever and weight loss.  HENT: Negative for nosebleeds and sore throat.   Eyes: Negative for blurred vision.  Respiratory: Negative for cough, shortness of breath and wheezing.   Cardiovascular: Negative for chest pain, orthopnea, leg swelling and PND.  Gastrointestinal: Negative for abdominal pain, constipation, diarrhea, heartburn, nausea and vomiting.  Genitourinary: Negative for dysuria and urgency.  Musculoskeletal: Negative for back pain.  Skin: Negative for rash.  Neurological: Positive for weakness. Negative for dizziness, speech change, focal weakness and headaches.  Endo/Heme/Allergies: Does not bruise/bleed easily.  Psychiatric/Behavioral: Negative for depression.    DRUG ALLERGIES:   Allergies  Allergen Reactions  . Ciprofloxacin   . Digoxin And Related   . Erythromycin   . Lopid [Gemfibrozil]   . Nitrofurantoin   . Penicillins   . Cefuroxime Rash   VITALS:  Blood pressure 134/61, pulse 75, temperature 99.6 F (37.6 C), temperature source Oral, resp. rate 20, height 5\' 1"  (1.549 m), weight 56.3 kg (124 lb 3.2 oz), SpO2 96 %. PHYSICAL EXAMINATION:  Physical Exam  Constitutional: She is oriented to person, place, and time and well-developed, well-nourished, and in no distress.  HENT:  Head: Normocephalic and atraumatic.  Eyes: Pupils are equal, round, and reactive to light. Conjunctivae and EOM are normal.  Neck: Normal range of motion. Neck supple. No tracheal deviation present. No thyromegaly present.  Cardiovascular: Normal rate, regular rhythm and normal heart sounds.   Pulmonary/Chest:  Effort normal and breath sounds normal. No respiratory distress. She has no wheezes. She exhibits no tenderness.  Abdominal: Soft. Bowel sounds are normal. She exhibits no distension. There is no tenderness.  Musculoskeletal: Normal range of motion.  Neurological: She is alert and oriented to person, place, and time. No cranial nerve deficit.  Skin: Skin is warm and dry. No rash noted.  Psychiatric: Mood and affect normal.   LABORATORY PANEL:  Female CBC  Recent Labs Lab 02/19/17 0549  WBC 3.2*  HGB 11.1*  HCT 31.4*  PLT 149*   ------------------------------------------------------------------------------------------------------------------ Chemistries   Recent Labs Lab 02/19/17 0549  NA 138  K 4.3  CL 105  CO2 26  GLUCOSE 94  BUN 60*  CREATININE 1.53*  CALCIUM 9.0   RADIOLOGY:  No results found. ASSESSMENT AND PLAN:   * Acute over CKD stage III due to dehydration. Likely ATN from hypotension. - continue IV fluids.  -Getting improved from 1.9->1.5  * Hypertension. - holding Aldactone. Reduced dose of Altace and Coreg.  * Nonischemic cardiomyopathy. Continue Coreg and Altace. Holding Lasix due to acute kidney injury.  * Dizziness due to orthostatic hypotension from dehydration.  Continue IV fluids.     All the records are reviewed and case discussed with Care Management/Social Worker. Management plans discussed with the patient, nursing and they are in agreement.  CODE STATUS: Full Code  TOTAL TIME TAKING CARE OF THIS PATIENT: 35 minutes.   More than 50% of the time was spent in counseling/coordination of care: YES  POSSIBLE D/C IN 1 DAYS, DEPENDING ON CLINICAL CONDITION.   Max Sane M.D on 02/19/2017 at 12:23 PM  Between 7am to 6pm -  Pager - [phone]  After 6pm go to [email or web address] - Proofreader  [facility]  Office  [phone]  CC: Primary care physician; Tracie Harrier, MD  Note: This dictation was  prepared with Dragon dictation along with smaller phrase technology. Any transcriptional errors that result from this process are unintentional.

## 2017-02-19 NOTE — Care Management Note (Addendum)
Case Management Note  Patient Details  Name: Jacqueline [name] MRN: [id number] Date of Birth: December 21, 1932  Subjective/Objective:     Admitted under observation status with the diagnosis of dizziness. [name] is in the home at night [phone]). Daughter is Mena Pauls, [phone]).   Did get prescriptions filled at [facility] in [place].  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 No Life Alert, but would like information. Home Health (physical therapy)  in 2001, but can't remember name of agency. No skilled nursing. No home oxygen.  Takes care of all basic activities of daily living herself, doesn't drive anymore. Several falls in the past, Good appetite. Yolanda Bonine will transport.            Action/Plan: Will give Life Alert information. Telephone call to daughter to discuss observation status. Left voice mail.   Expected Discharge Date:  02/19/17               Expected Discharge Plan:     In-House Referral:     Discharge planning Services     Post Acute Care Choice:    Choice offered to:     DME Arranged:    DME Agency:     HH Arranged:    HH Agency:     Status of Service:     If discussed at [employer], dates discussed:    Additional Comments:  Shelbie Ammons, [facility] [phone] 02/19/2017, 10:28 AM

## 2017-02-19 NOTE — Evaluation (Signed)
Physical Therapy Evaluation Patient Details Name: Jacqueline [name] MRN: [id number] DOB: [birth year] Today's Date: 02/19/2017   History of Present Illness  Pt is an 83 y.o. female presenting to hospital with weakness and dizziness.  Pt admitted with AKI, hypotensive, and dizziness.  PMH includes DM, htn, dilated idiopathic cardiomyopathy with EF 20%, and CKD stage III.  Clinical Impression  Prior to hospital admission, pt was modified independent ambulating short household distances with rollator.  Pt reports living with her adult son.  Currently pt is SBA supine to/from sit with increased effort.  Pt initially sat up on edge of bed and c/o dizziness so pt assisted back into supine.  Took pt's BP in supine (137/53) and then sitting (117/43) with pt c/o dizziness and not feeling well in sitting (pt declined standing d/t these symptoms; nursing notified of above).  Nursing tech reporting assisting pt up to commode multiple times this morning and pt appeared to be able to move fairly well during that activity.  Pt would benefit from skilled PT to address noted impairments and functional limitations (see below for any additional details).  Anticipate once pt's symptom's improve, pt's ability to perform functional mobility will also improve.  Upon hospital discharge, recommend pt discharge to home with SBA for functional mobility for safety (d/t current dizziness).    Follow Up Recommendations Supervision for mobility/OOB;Home health PT    Equipment Recommendations  Other (comment) (pt has rollator at home)    Recommendations for Other Services       Precautions / Restrictions Precautions Precautions: Fall Restrictions Weight Bearing Restrictions: No      Mobility  Bed Mobility Overal bed mobility: Needs Assistance Bed Mobility: Supine to Sit;Sit to Supine     Supine to sit: Supervision;HOB elevated Sit to supine: Supervision;HOB elevated   General bed mobility comments: increased effort and  time to perform with use of bedrail supine to sit; SBA sit to supine (2 assist to boost up in bed)  Transfers                 General transfer comment: Pt declined to stand d/t dizziness/not feeling well and requesting to lay back down instead  Ambulation/Gait             General Gait Details: Pt declined to stand d/t dizziness/not feeling well and requesting to lay back down instead  Stairs            Wheelchair Mobility    Modified Rankin (Stroke Patients Only)       Balance Overall balance assessment: Needs assistance Sitting-balance support: No upper extremity supported;Feet supported Sitting balance-Leahy Scale: Good Sitting balance - Comments: sitting reaching within BOS       Standing balance comment: Pt declined to stand d/t dizziness                             Pertinent Vitals/Pain Pain Assessment: No/denies pain  HR and O2 on room air WFL during session.    Home Living Family/patient expects to be discharged to:: Private residence Living Arrangements: Children (Pt's son) Available Help at Discharge: Family Type of Home: House Home Access: [address]: One [place]: Environmental consultant - 4 wheels;Wheelchair - power; bed with adjustable HOB angle      Prior Function Level of Independence: Needs assistance   Gait / Transfers Assistance Needed: Walks short distances with rollator.  ADL's / Homemaking  Assistance Needed: Independent with ADL's.  Comments: Does not drive.  Pt reports son assists with garbage but not much else.     Hand Dominance        Extremity/Trunk Assessment   Upper Extremity Assessment Upper Extremity Assessment: Generalized weakness    Lower Extremity Assessment Lower Extremity Assessment: Generalized weakness (pt reporting L LE weaker than R LE baseline in general )    Cervical / Trunk Assessment Cervical / Trunk Assessment: Normal  Communication   Communication: No  difficulties  Cognition Arousal/Alertness: Awake/alert Behavior During Therapy: WFL for tasks assessed/performed Overall Cognitive Status: Within Functional Limits for tasks assessed                                        General Comments General comments (skin integrity, edema, etc.): Pt resting in bed upon PT arrival.  Nursing cleared pt for participation in physical therapy.  Pt agreeable to PT session.    Exercises     Assessment/Plan    PT Assessment Patient needs continued PT services  PT Problem List Decreased strength;Decreased mobility;Decreased activity tolerance       PT Treatment Interventions DME instruction;Gait training;Functional mobility training;Therapeutic activities;Therapeutic exercise;Balance training;Patient/family education    PT Goals (Current goals can be found in the Care Plan section)  Acute Rehab PT Goals Patient Stated Goal: to improve her strength PT Goal Formulation: With patient Time For Goal Achievement: 03/05/17 Potential to Achieve Goals: Good    Frequency Min 2X/week   Barriers to discharge        Co-evaluation               AM-PAC PT "6 Clicks" Daily Activity  Outcome Measure Difficulty turning over in bed (including adjusting bedclothes, sheets and blankets)?: A Little Difficulty moving from lying on back to sitting on the side of the bed? : A Lot Difficulty sitting down on and standing up from a chair with arms (e.g., wheelchair, bedside commode, etc,.)?: A Little Help needed moving to and from a bed to chair (including a wheelchair)?: A Little Help needed walking in hospital room?: A Little Help needed climbing 3-5 steps with a railing? : A Lot 6 Click Score: 16    End of Session Equipment Utilized During Treatment: Gait belt Activity Tolerance: Other (comment) (Limited d/t dizziness) Patient left: in bed;with call bell/phone within reach;with bed alarm set Nurse Communication: Mobility  status;Precautions (Pt's symptom's and BP during session; also mobility limitations) PT Visit Diagnosis: Other abnormalities of gait and mobility (R26.89);Muscle weakness (generalized) (M62.81)    Time: 1110-1135 PT Time Calculation (min) (ACUTE ONLY): 25 min   Charges:   PT Evaluation $PT Eval Low Complexity: 1 Low     PT G Codes:   PT G-Codes **NOT FOR INPATIENT CLASS** Functional Assessment Tool Used: AM-PAC 6 Clicks Basic Mobility Functional Limitation: Mobility: Walking and moving around Mobility: Walking and Moving Around Current Status (T0626): At least 40 percent but less than 60 percent impaired, limited or restricted Mobility: Walking and Moving Around Goal Status [phone]): At least 1 percent but less than 20 percent impaired, limited or restricted    [facility], PT 02/19/17, 1:15 PM [phone]

## 2017-02-20 DIAGNOSIS — E860 Dehydration: Secondary | ICD-10-CM | POA: Diagnosis not present

## 2017-02-20 LAB — BASIC METABOLIC PANEL
Anion gap: 6 (ref 5–15)
BUN: 37 mg/dL — AB (ref 6–20)
CHLORIDE: 108 mmol/L (ref 101–111)
CO2: 24 mmol/L (ref 22–32)
CREATININE: 1.01 mg/dL — AB (ref 0.44–1.00)
Calcium: 8.9 mg/dL (ref 8.9–10.3)
GFR calc non Af Amer: 50 mL/min — ABNORMAL LOW (ref >60)
GFR, EST AFRICAN AMERICAN: 58 mL/min — AB (ref >60)
Glucose, Bld: 90 mg/dL (ref 65–99)
Potassium: 4.1 mmol/L (ref 3.5–5.1)
SODIUM: 138 mmol/L (ref 135–145)

## 2017-02-20 LAB — GLUCOSE, CAPILLARY
Glucose-Capillary: 89 mg/dL (ref 65–99)
Glucose-Capillary: 101 mg/dL — ABNORMAL HIGH (ref 65–99)

## 2017-02-20 LAB — CBC
HEMATOCRIT: 32.4 % — AB (ref 35.0–47.0)
HEMOGLOBIN: 11.2 g/dL — AB (ref 12.0–16.0)
MCH: 31.6 pg (ref 26.0–34.0)
MCHC: 34.4 g/dL (ref 32.0–36.0)
MCV: 91.7 fL (ref 80.0–100.0)
Platelets: 154 10*3/uL (ref 150–440)
RBC: 3.54 MIL/uL — ABNORMAL LOW (ref 3.80–5.20)
RDW: 14.3 % (ref 11.5–14.5)
WBC: 3.2 10*3/uL — AB (ref 3.6–11.0)

## 2017-02-20 NOTE — Progress Notes (Signed)
Patients son will arrive around 1500 to transport patient home. Madlyn Frankel, RN

## 2017-02-20 NOTE — Progress Notes (Signed)
Discharge instructions given and went over with patient and patients grandson at bedside. All questions answered. Patient discharged to home with home health via wheelchair by volunteer services. Madlyn Frankel, RN

## 2017-02-20 NOTE — Clinical Social Work Note (Signed)
CSW received referral for SNF.  PT is recommending home health, plan is to discharge home with home health.  CSW to sign off please re-consult if social work needs arise.  Devereaux Grayson R. Jori Thrall, MSW, LCSWA [phone]  

## 2017-02-20 NOTE — Discharge Instructions (Signed)
Dehydration, Adult Dehydration is when there is not enough fluid or water in your body. This happens when you lose more fluids than you take in. Dehydration can range from mild to very bad. It should be treated right away to keep it from getting very bad. Symptoms of mild dehydration may include:  Thirst.  Dry lips.  Slightly dry mouth.  Dry, warm skin.  Dizziness. Symptoms of moderate dehydration may include:  Very dry mouth.  Muscle cramps.  Dark pee (urine). Pee may be the color of tea.  Your body making less pee.  Your eyes making fewer tears.  Heartbeat that is uneven or faster than normal (palpitations).  Headache.  Light-headedness, especially when you stand up from sitting.  Fainting (syncope). Symptoms of very bad dehydration may include:  Changes in skin, such as: ? Cold and clammy skin. ? Blotchy (mottled) or pale skin. ? Skin that does not quickly return to normal after being lightly pinched and let go (poor skin turgor).  Changes in body fluids, such as: ? Feeling very thirsty. ? Your eyes making fewer tears. ? Not sweating when body temperature is high, such as in hot weather. ? Your body making very little pee.  Changes in vital signs, such as: ? Weak pulse. ? Pulse that is more than 100 beats a minute when you are sitting still. ? Fast breathing. ? Low blood pressure.  Other changes, such as: ? Sunken eyes. ? Cold hands and feet. ? Confusion. ? Lack of energy (lethargy). ? Trouble waking up from sleep. ? Short-term weight loss. ? Unconsciousness. Follow these instructions at home:  If told by your doctor, drink an ORS: ? Make an ORS by using instructions on the package. ? Start by drinking small amounts, about  cup (120 mL) every 5-10 minutes. ? Slowly drink more until you have had the amount that your doctor said to have.  Drink enough clear fluid to keep your pee clear or pale yellow. If you were told to drink an ORS, finish the ORS  first, then start slowly drinking clear fluids. Drink fluids such as: ? Water. Do not drink only water by itself. Doing that can make the salt (sodium) level in your body get too low (hyponatremia). ? Ice chips. ? Fruit juice that you have added water to (diluted). ? Low-calorie sports drinks.  Avoid: ? Alcohol. ? Drinks that have a lot of sugar. These include high-calorie sports drinks, fruit juice that does not have water added, and soda. ? Caffeine. ? Foods that are greasy or have a lot of fat or sugar.  Take over-the-counter and prescription medicines only as told by your doctor.  Do not take salt tablets. Doing that can make the salt level in your body get too high (hypernatremia).  Eat foods that have minerals (electrolytes). Examples include bananas, oranges, potatoes, tomatoes, and spinach.  Keep all follow-up visits as told by your doctor. This is important. Contact a doctor if:  You have belly (abdominal) pain that: ? Gets worse. ? Stays in one area (localizes).  You have a rash.  You have a stiff neck.  You get angry or annoyed more easily than normal (irritability).  You are more sleepy than normal.  You have a harder time waking up than normal.  You feel: ? Weak. ? Dizzy. ? Very thirsty.  You have peed (urinated) only a small amount of very dark pee during 6-8 hours. Get help right away if:  You have symptoms of  very bad dehydration.  You cannot drink fluids without throwing up (vomiting).  Your symptoms get worse with treatment.  You have a fever.  You have a very bad headache.  You are throwing up or having watery poop (diarrhea) and it: ? Gets worse. ? Does not go away.  You have blood or something green (bile) in your throw-up.  You have blood in your poop (stool). This may cause poop to look black and tarry.  You have not peed in 6-8 hours.  You pass out (faint).  Your heart rate when you are sitting still is more than 100 beats a  minute.  You have trouble breathing. This information is not intended to replace advice given to you by your health care provider. Make sure you discuss any questions you have with your health care provider. Document Released: 03/29/2009 Document Revised: 12/21/2015 Document Reviewed: 07/27/2015 Elsevier Interactive Patient Education  2018 [employer].   Acute Kidney Injury, Adult Acute kidney injury is a sudden worsening of kidney function. The kidneys are organs that have several jobs. They filter the blood to remove waste products and extra fluid. They also maintain a healthy balance of minerals and hormones in the body, which helps control blood pressure and keep bones strong. With this condition, your kidneys do not do their jobs as well as they should. This condition ranges from mild to severe. Over time it may develop into long-lasting (chronic) kidney disease. Early detection and treatment may prevent acute kidney injury from developing into a chronic condition. What are the causes? Common causes of this condition include:  A problem with blood flow to the kidneys. This may be caused by: ? Low blood pressure (hypotension) or shock. ? Blood loss. ? Heart and blood vessel (cardiovascular) disease. ? Severe burns. ? Liver disease.  Direct damage to the kidneys. This may be caused by: ? Certain medicines. ? A kidney infection. ? Poisoning. ? Being around or in contact with toxic substances. ? A surgical wound. ? A hard, direct hit to the kidney area.  A sudden blockage of urine flow. This may be caused by: ? Cancer. ? Kidney stones. ? An enlarged prostate in males.  What are the signs or symptoms? Symptoms of this condition may not be obvious until the condition becomes severe. Symptoms of this condition can include:  Tiredness (lethargy), or difficulty staying awake.  Nausea or vomiting.  Swelling (edema) of the face, legs, ankles, or feet.  Problems with urination,  such as: ? Abdominal pain, or pain along the side of your stomach (flank). ? Decreased urine production. ? Decrease in the force of urine flow.  Muscle twitches and cramps, especially in the legs.  Confusion or trouble concentrating.  Loss of appetite.  Fever.  How is this diagnosed? This condition may be diagnosed with tests, including:  Blood tests.  Urine tests.  Imaging tests.  A test in which a sample of tissue is removed from the kidneys to be examined under a microscope (kidney biopsy).  How is this treated? Treatment for this condition depends on the cause and how severe the condition is. In mild cases, treatment may not be needed. The kidneys may heal on their own. In more severe cases, treatment will involve:  Treating the cause of the kidney injury. This may involve changing any medicines you are taking or adjusting your dosage.  Fluids. You may need specialized IV fluids to balance your body's needs.  Having a catheter placed to drain  urine and prevent blockages.  Preventing problems from occurring. This may mean avoiding certain medicines or procedures that can cause further injury to the kidneys.  In some cases treatment may also require:  A procedure to remove toxic wastes from the body (dialysis or continuous renal replacement therapy - CRRT).  Surgery. This may be done to repair a torn kidney, or to remove the blockage from the urinary system.  Follow these instructions at home: Medicines  Take over-the-counter and prescription medicines only as told by your health care provider.  Do not take any new medicines without your health care provider's approval. Many medicines can worsen your kidney damage.  Do not take any vitamin and mineral supplements without your health care provider's approval. Many nutritional supplements can worsen your kidney damage. Lifestyle  If your health care provider prescribed changes to your diet, follow them. You may need  to decrease the amount of protein you eat.  Achieve and maintain a healthy weight. If you need help with this, ask your health care provider.  Start or continue an exercise plan. Try to exercise at least 30 minutes a day, 5 days a week.  Do not use any tobacco products, such as cigarettes, chewing tobacco, and e-cigarettes. If you need help quitting, ask your health care provider. General instructions  Keep track of your blood pressure. Report changes in your blood pressure as told by your health care provider.  Stay up to date with immunizations. Ask your health care provider which immunizations you need.  Keep all follow-up visits as told by your health care provider. This is important. Where to find more information:  American Association of Kidney Patients: BombTimer.gl  National Kidney Foundation: www.kidney.[place]: https://mathis.com/  Life Options Rehabilitation Program: ? www.lifeoptions.org ? www.kidneyschool.org Contact a health care provider if:  Your symptoms get worse.  You develop new symptoms. Get help right away if:  You develop symptoms of worsening kidney disease, which include: ? Headaches. ? Abnormally dark or light skin. ? Easy bruising. ? Frequent hiccups. ? Chest pain. ? Shortness of breath. ? End of menstruation in women. ? Seizures. ? Confusion or altered mental status. ? Abdominal or back pain. ? Itchiness.  You have a fever.  Your body is producing less urine.  You have pain or bleeding when you urinate. Summary  Acute kidney injury is a sudden worsening of kidney function.  Acute kidney injury can be caused by problems with blood flow to the kidneys, direct damage to the kidneys, and sudden blockage of urine flow.  Symptoms of this condition may not be obvious until it becomes severe. Symptoms may include edema, lethargy, confusion, nausea or vomiting, and problems passing urine.  This condition can usually be  diagnosed with blood tests, urine tests, and imaging tests. Sometimes a kidney biopsy is done to diagnose this condition.  Treatment for this condition often involves treating the underlying cause. It is treated with fluids, medicines, dialysis, diet changes, or surgery. This information is not intended to replace advice given to you by your health care provider. Make sure you discuss any questions you have with your health care provider. Document Released: 12/16/2010 Document Revised: 05/23/2016 Document Reviewed: 05/23/2016 Elsevier Interactive Patient Education  2017 [employer].

## 2017-02-20 NOTE — Care Management Note (Signed)
Case Management Note  Patient Details  Name: Jacqueline [name] MRN: [id number] Date of Birth: 08/23/1932  Subjective/Objective:    Received telephone call from daughter, Mena Pauls. States she just back into the [place]  and is currently  In [place]. Will be returning to [facility] on Sunday. Will have Dr. Manuella Ghazi call her with medical update.               Action/Plan: Physical therapy evaluation completed. Recommended home with home health and therapy. Discussed Home Health agencies.   Expected Discharge Date:  02/20/17               Expected Discharge Plan:     In-House Referral:     Discharge planning Services   Nursing/Physical therapy  Post Acute Care Choice:   yes Choice offered to:   Mena Pauls, daughter  DME Arranged:   no DME Agency:     HH Arranged:   yes HH Agency:   [facility]  Status of Service:     If discussed at [place] of Stay Meetings, dates discussed:    Additional Comments:  Shelbie Ammons, [place] Management [phone] 02/20/2017, 10:04 AM

## 2017-02-20 NOTE — Progress Notes (Signed)
PT Cancellation Note  Patient Details Name: Jacqueline [name] MRN: [id number] DOB: 03/07/1933   Cancelled Treatment:    Reason Eval/Treat Not Completed: Other (comment).  Pt reporting not feeling up to getting up walking/participating in PT at this time.  Nursing reports pt has ambulated to bathroom with RW this morning without any c/o symptom's.  Will re-attempt PT treatment at a later date/time.  Leitha Bleak, PT 02/20/17, 11:39 AM [phone]

## 2017-02-22 NOTE — Discharge Summary (Signed)
[place] [facility] [place] NAME: Jacqueline [name]    MR#:  [id number]  DATE OF BIRTH:  1933/02/15  DATE OF ADMISSION:  02/18/2017   ADMITTING PHYSICIAN: Hillary Bow, MD  DATE OF DISCHARGE: 02/20/2017  3:45 PM  PRIMARY CARE PHYSICIAN: Tracie Harrier, MD   ADMISSION DIAGNOSIS:  Dehydration [E86.0] Weakness [R53.1] DISCHARGE DIAGNOSIS:  Active Problems:   Dizziness  SECONDARY DIAGNOSIS:   Past Medical History:  Diagnosis Date  . Diabetes mellitus without complication ([place])   . Dilated idiopathic cardiomyopathy ([place])   . GERD (gastroesophageal reflux disease)   . Hyperlipidemia   . Hypertension   . Hypothyroidism    HOSPITAL COURSE:   * Acute over CKD stage III due to dehydration. Prerenal etio - resolved with hydration. Back to baseline.  * Dizziness due to orthostatic hypotension from dehydration.  improved with hydration  DISCHARGE CONDITIONS:  stable CONSULTS OBTAINED:   DRUG ALLERGIES:   Allergies  Allergen Reactions  . Ciprofloxacin   . Digoxin And Related   . Erythromycin   . Lopid [Gemfibrozil]   . Nitrofurantoin   . Penicillins   . Cefuroxime Rash   DISCHARGE MEDICATIONS:   Allergies as of 02/20/2017      Reactions   Ciprofloxacin    Digoxin And Related    Erythromycin    Lopid [gemfibrozil]    Nitrofurantoin    Penicillins    Cefuroxime Rash      Medication List    TAKE these medications   aspirin EC 81 MG tablet Take 81 mg by mouth daily.   carvedilol 12.5 MG tablet Commonly known as:  COREG Take 12.5 mg by mouth 2 (two) times daily with a meal.   ergocalciferol 50000 units capsule Commonly known as:  VITAMIN D2 Take 50,000 Units by mouth once a week.   fenofibrate 145 MG tablet Commonly known as:  TRICOR Take 145 mg by mouth daily.   furosemide 40 MG tablet Commonly known as:  LASIX Take 20-40 mg by mouth.   FUSION PLUS Caps Take by mouth.   levothyroxine 25 MCG tablet Commonly  known as:  SYNTHROID, LEVOTHROID Take 25 mcg by mouth daily before breakfast.   metFORMIN 500 MG tablet Commonly known as:  GLUCOPHAGE Take by mouth 2 (two) times daily with a meal.   pantoprazole 40 MG tablet Commonly known as:  PROTONIX Take 40 mg by mouth daily.   potassium chloride 10 MEQ tablet Commonly known as:  K-DUR Take 10 mEq by mouth daily.   ramipril 10 MG capsule Commonly known as:  ALTACE Take 10 mg by mouth daily.   sertraline 50 MG tablet Commonly known as:  ZOLOFT Take 50 mg by mouth daily.   spironolactone 25 MG tablet Commonly known as:  ALDACTONE Take 25 mg by mouth daily.            Discharge Care Instructions        Start     Ordered   02/20/17 0000  Increase activity slowly     02/20/17 0808   02/20/17 0000  Diet - low sodium heart healthy     02/20/17 0808       DISCHARGE INSTRUCTIONS:   DIET:  Regular diet DISCHARGE CONDITION:  Good ACTIVITY:  Activity as tolerated OXYGEN:  Home Oxygen: No.  Oxygen Delivery: room air DISCHARGE LOCATION:  home   If you experience worsening of your admission symptoms, develop shortness of breath, life threatening emergency, suicidal  or homicidal thoughts you must seek medical attention immediately by calling 911 or calling your MD immediately  if symptoms less severe.  You Must read complete instructions/literature along with all the possible adverse reactions/side effects for all the Medicines you take and that have been prescribed to you. Take any new Medicines after you have completely understood and accpet all the possible adverse reactions/side effects.   Please note  You were cared for by a hospitalist during your hospital stay. If you have any questions about your discharge medications or the care you received while you were in the hospital after you are discharged, you can call the unit and asked to speak with the hospitalist on call if the hospitalist that took care of you is not  available. Once you are discharged, your primary care physician will handle any further medical issues. Please note that NO REFILLS for any discharge medications will be authorized once you are discharged, as it is imperative that you return to your primary care physician (or establish a relationship with a primary care physician if you do not have one) for your aftercare needs so that they can reassess your need for medications and monitor your lab values.    On the day of Discharge:  VITAL SIGNS:  Blood pressure (!) 126/59, pulse 72, temperature 98.4 F (36.9 C), temperature source Oral, resp. rate 16, height 5\' 1"  (1.549 m), weight 56.7 kg (124 lb 14.4 oz), SpO2 96 %. PHYSICAL EXAMINATION:  GENERAL:  83 y.o.-year-old patient lying in the bed with no acute distress.  EYES: Pupils equal, round, reactive to light and accommodation. No scleral icterus. Extraocular muscles intact.  HEENT: Head atraumatic, normocephalic. Oropharynx and nasopharynx clear.  NECK:  Supple, no jugular venous distention. No thyroid enlargement, no tenderness.  LUNGS: Normal breath sounds bilaterally, no wheezing, rales,rhonchi or crepitation. No use of accessory muscles of respiration.  CARDIOVASCULAR: S1, S2 normal. No murmurs, rubs, or gallops.  ABDOMEN: Soft, non-tender, non-distended. Bowel sounds present. No organomegaly or mass.  EXTREMITIES: No pedal edema, cyanosis, or clubbing.  NEUROLOGIC: Cranial nerves II through XII are intact. Muscle strength 5/5 in all extremities. Sensation intact. Gait not checked.  PSYCHIATRIC: The patient is alert and oriented x 3.  SKIN: No obvious rash, lesion, or ulcer.  DATA REVIEW:   CBC  Recent Labs Lab 02/20/17 0609  WBC 3.2*  HGB 11.2*  HCT 32.4*  PLT 154    Chemistries   Recent Labs Lab 02/20/17 0609  NA 138  K 4.1  CL 108  CO2 24  GLUCOSE 90  BUN 37*  CREATININE 1.01*  CALCIUM 8.9     Follow-up Information    Tracie Harrier, MD. Go on  03/02/2017.   Specialty:  Internal Medicine Why:  at 2:15 p.m. Contact information: [address] [place] [address] [phone]           Management plans discussed with the patient, family and they are in agreement.  CODE STATUS: Prior   TOTAL TIME TAKING CARE OF THIS PATIENT: 45 minutes.    Max Sane M.D on 02/22/2017 at 2:34 PM  Between 7am to 6pm - Pager - [phone]  After 6pm go to [email or web address] - Proofreader  [facility]  Office  [phone]  CC: Primary care physician; Tracie Harrier, MD   Note: This dictation was prepared with Dragon dictation along with smaller phrase technology. Any transcriptional errors that result from this process are unintentional.

## 2017-02-24 ENCOUNTER — Observation Stay
Admission: EM | Admit: 2017-02-24 | Discharge: 2017-02-25 | Disposition: A | Discharge status: Home / Self Care | Payer: Medicare Other | Attending: Internal Medicine | Admitting: Internal Medicine

## 2017-02-24 ENCOUNTER — Emergency Department: Payer: Medicare Other

## 2017-02-24 ENCOUNTER — Encounter: Payer: Self-pay | Admitting: *Deleted

## 2017-02-24 DIAGNOSIS — E162 Hypoglycemia, unspecified: Secondary | ICD-10-CM | POA: Diagnosis present

## 2017-02-24 DIAGNOSIS — K7460 Unspecified cirrhosis of liver: Secondary | ICD-10-CM | POA: Insufficient documentation

## 2017-02-24 DIAGNOSIS — I700 Atherosclerosis of aorta: Secondary | ICD-10-CM | POA: Insufficient documentation

## 2017-02-24 DIAGNOSIS — E860 Dehydration: Secondary | ICD-10-CM | POA: Insufficient documentation

## 2017-02-24 DIAGNOSIS — E11649 Type 2 diabetes mellitus with hypoglycemia without coma: Secondary | ICD-10-CM | POA: Diagnosis not present

## 2017-02-24 DIAGNOSIS — Z7982 Long term (current) use of aspirin: Secondary | ICD-10-CM | POA: Insufficient documentation

## 2017-02-24 DIAGNOSIS — E785 Hyperlipidemia, unspecified: Secondary | ICD-10-CM | POA: Insufficient documentation

## 2017-02-24 DIAGNOSIS — E871 Hypo-osmolality and hyponatremia: Secondary | ICD-10-CM | POA: Diagnosis not present

## 2017-02-24 DIAGNOSIS — Z79899 Other long term (current) drug therapy: Secondary | ICD-10-CM | POA: Diagnosis not present

## 2017-02-24 DIAGNOSIS — I420 Dilated cardiomyopathy: Secondary | ICD-10-CM | POA: Insufficient documentation

## 2017-02-24 DIAGNOSIS — I5022 Chronic systolic (congestive) heart failure: Secondary | ICD-10-CM | POA: Diagnosis not present

## 2017-02-24 DIAGNOSIS — Z880 Allergy status to penicillin: Secondary | ICD-10-CM | POA: Diagnosis not present

## 2017-02-24 DIAGNOSIS — R5383 Other fatigue: Secondary | ICD-10-CM | POA: Diagnosis not present

## 2017-02-24 DIAGNOSIS — Z87891 Personal history of nicotine dependence: Secondary | ICD-10-CM | POA: Insufficient documentation

## 2017-02-24 DIAGNOSIS — R935 Abnormal findings on diagnostic imaging of other abdominal regions, including retroperitoneum: Secondary | ICD-10-CM

## 2017-02-24 DIAGNOSIS — M419 Scoliosis, unspecified: Secondary | ICD-10-CM | POA: Insufficient documentation

## 2017-02-24 DIAGNOSIS — Z881 Allergy status to other antibiotic agents status: Secondary | ICD-10-CM | POA: Diagnosis not present

## 2017-02-24 DIAGNOSIS — M549 Dorsalgia, unspecified: Secondary | ICD-10-CM

## 2017-02-24 DIAGNOSIS — R2681 Unsteadiness on feet: Secondary | ICD-10-CM | POA: Diagnosis not present

## 2017-02-24 DIAGNOSIS — E039 Hypothyroidism, unspecified: Secondary | ICD-10-CM | POA: Diagnosis present

## 2017-02-24 DIAGNOSIS — R932 Abnormal findings on diagnostic imaging of liver and biliary tract: Secondary | ICD-10-CM | POA: Diagnosis present

## 2017-02-24 DIAGNOSIS — I110 Hypertensive heart disease with heart failure: Secondary | ICD-10-CM | POA: Diagnosis not present

## 2017-02-24 DIAGNOSIS — I5033 Acute on chronic diastolic (congestive) heart failure: Secondary | ICD-10-CM | POA: Diagnosis present

## 2017-02-24 DIAGNOSIS — Z7984 Long term (current) use of oral hypoglycemic drugs: Secondary | ICD-10-CM | POA: Diagnosis not present

## 2017-02-24 DIAGNOSIS — K219 Gastro-esophageal reflux disease without esophagitis: Secondary | ICD-10-CM | POA: Diagnosis not present

## 2017-02-24 DIAGNOSIS — I5023 Acute on chronic systolic (congestive) heart failure: Secondary | ICD-10-CM | POA: Diagnosis present

## 2017-02-24 DIAGNOSIS — E119 Type 2 diabetes mellitus without complications: Secondary | ICD-10-CM

## 2017-02-24 DIAGNOSIS — I10 Essential (primary) hypertension: Secondary | ICD-10-CM | POA: Diagnosis present

## 2017-02-24 HISTORY — DX: Disorder of kidney and ureter, unspecified: N28.9

## 2017-02-24 HISTORY — DX: Heart failure, unspecified: I50.9

## 2017-02-24 LAB — CBC
HEMATOCRIT: 35.2 % (ref 35.0–47.0)
Hemoglobin: 12.1 g/dL (ref 12.0–16.0)
MCH: 31.2 pg (ref 26.0–34.0)
MCHC: 34.4 g/dL (ref 32.0–36.0)
MCV: 90.6 fL (ref 80.0–100.0)
PLATELETS: 211 10*3/uL (ref 150–440)
RBC: 3.88 MIL/uL (ref 3.80–5.20)
RDW: 14.2 % (ref 11.5–14.5)
WBC: 5.1 10*3/uL (ref 3.6–11.0)

## 2017-02-24 LAB — PROTIME-INR
INR: 1.05
Prothrombin Time: 13.6 seconds (ref 11.4–15.2)

## 2017-02-24 LAB — URINALYSIS, COMPLETE (UACMP) WITH MICROSCOPIC
BILIRUBIN URINE: NEGATIVE
Glucose, UA: NEGATIVE mg/dL
Hgb urine dipstick: NEGATIVE
KETONES UR: NEGATIVE mg/dL
LEUKOCYTES UA: NEGATIVE
NITRITE: NEGATIVE
PH: 5 (ref 5.0–8.0)
Protein, ur: NEGATIVE mg/dL
RBC / HPF: NONE SEEN RBC/hpf (ref 0–5)
Specific Gravity, Urine: 1.005 (ref 1.005–1.030)
Squamous Epithelial / LPF: NONE SEEN

## 2017-02-24 LAB — COMPREHENSIVE METABOLIC PANEL
ALT: 23 U/L (ref 14–54)
AST: 45 U/L — AB (ref 15–41)
Albumin: 4.3 g/dL (ref 3.5–5.0)
Alkaline Phosphatase: 25 U/L — ABNORMAL LOW (ref 38–126)
Anion gap: 8 (ref 5–15)
BILIRUBIN TOTAL: 0.8 mg/dL (ref 0.3–1.2)
BUN: 24 mg/dL — AB (ref 6–20)
CHLORIDE: 97 mmol/L — AB (ref 101–111)
CO2: 25 mmol/L (ref 22–32)
Calcium: 9.3 mg/dL (ref 8.9–10.3)
Creatinine, Ser: 0.97 mg/dL (ref 0.44–1.00)
GFR, EST NON AFRICAN AMERICAN: 53 mL/min — AB (ref >60)
Glucose, Bld: 58 mg/dL — ABNORMAL LOW (ref 65–99)
POTASSIUM: 4.1 mmol/L (ref 3.5–5.1)
Sodium: 130 mmol/L — ABNORMAL LOW (ref 135–145)
TOTAL PROTEIN: 7.2 g/dL (ref 6.5–8.1)

## 2017-02-24 LAB — GLUCOSE, CAPILLARY
GLUCOSE-CAPILLARY: 63 mg/dL — AB (ref 65–99)
GLUCOSE-CAPILLARY: 72 mg/dL (ref 65–99)
Glucose-Capillary: 59 mg/dL — ABNORMAL LOW (ref 65–99)
Glucose-Capillary: 73 mg/dL (ref 65–99)
Glucose-Capillary: 65 mg/dL (ref 65–99)
Glucose-Capillary: 160 mg/dL — ABNORMAL HIGH (ref 65–99)
Glucose-Capillary: 74 mg/dL (ref 65–99)

## 2017-02-24 MED ORDER — DEXTROSE 50 % IV SOLN
1.0000 | Freq: Once | INTRAVENOUS | Status: AC
  Administered 2017-02-24: 50 mL via INTRAVENOUS
  Filled 2017-02-24: qty 50

## 2017-02-24 MED ORDER — DEXTROSE 10 % IV SOLN
INTRAVENOUS | Status: DC
  Administered 2017-02-24: 23:00:00 via INTRAVENOUS
  Filled 2017-02-24: qty 1000

## 2017-02-24 MED ORDER — IOPAMIDOL (ISOVUE-300) INJECTION 61%
75.0000 mL | Freq: Once | INTRAVENOUS | Status: AC | PRN
  Administered 2017-02-24: 75 mL via INTRAVENOUS

## 2017-02-24 NOTE — ED Notes (Signed)
Pt returned from CT °

## 2017-02-24 NOTE — ED Notes (Signed)
Pt give meal tray, extra graham crackers and peanut butter, and orange juice.

## 2017-02-24 NOTE — ED Provider Notes (Signed)
[facility] Emergency Department Provider Note    First MD Initiated Contact with Patient 02/24/17 1513     (approximate)  I have reviewed the triage vital signs and the nursing notes.   HISTORY  Chief Complaint Hypoglycemia    HPI Jacqueline [name] is a 83 y.o. female presents to the ER for symptomatically hypoglycemia. Patient is only on oral metformin. No long-acting insulins. States that she gets dizzy and diaphoretic. They've been checking her blood sugar no be in the high 40s to low 50s. There've been giving oral glucose tabs without improvement. They tried multiple efforts never able to get the blood sugar over 70 therefore she brought her to the ER today for evaluation. No other symptoms other than some nausea. Denies any chest pain or shortness of breath. No other medication changes.   Past Medical History:  Diagnosis Date  . CHF (congestive heart failure) ([place])   . Diabetes mellitus without complication ([place])   . Dilated idiopathic cardiomyopathy ([place])   . GERD (gastroesophageal reflux disease)   . Hyperlipidemia   . Hypertension   . Hypothyroidism   . Renal disorder    Family History  Problem Relation Age of Onset  . Premature CHD Brother    Past Surgical History:  Procedure Laterality Date  . BUNIONECTOMY    . COLONOSCOPY    . TONSILLECTOMY     Patient Active Problem List   Diagnosis Date Noted  . Dizziness 02/18/2017      Prior to Admission medications   Medication Sig Start Date End Date Taking? Authorizing Provider  aspirin EC 81 MG tablet Take 81 mg by mouth daily.    [provider]  carvedilol (COREG) 12.5 MG tablet Take 12.5 mg by mouth 2 (two) times daily with a meal.    [provider]  ergocalciferol (VITAMIN D2) 50000 units capsule Take 50,000 Units by mouth once a week.    [provider]  fenofibrate (TRICOR) 145 MG tablet Take 145 mg by mouth daily.    [provider]  furosemide  (LASIX) 40 MG tablet Take 20-40 mg by mouth.     [provider]  Iron-FA-B Cmp-C-Biot-Probiotic (FUSION PLUS) CAPS Take by mouth.    [provider]  levothyroxine (SYNTHROID, LEVOTHROID) 25 MCG tablet Take 25 mcg by mouth daily before breakfast.    [provider]  metFORMIN (GLUCOPHAGE) 500 MG tablet Take by mouth 2 (two) times daily with a meal.    [provider]  pantoprazole (PROTONIX) 40 MG tablet Take 40 mg by mouth daily.    [provider]  potassium chloride (K-DUR) 10 MEQ tablet Take 10 mEq by mouth daily.    [provider]  ramipril (ALTACE) 10 MG capsule Take 10 mg by mouth daily.    [provider]  sertraline (ZOLOFT) 50 MG tablet Take 50 mg by mouth daily.    [provider]  spironolactone (ALDACTONE) 25 MG tablet Take 25 mg by mouth daily.    [provider]    Allergies Ciprofloxacin; Digoxin and related; Erythromycin; Lopid [gemfibrozil]; Nitrofurantoin; Penicillins; and Cefuroxime    Social History Social History  Substance Use Topics  . Smoking status: Former Research scientist (life sciences)  . Smokeless tobacco: Never Used  . Alcohol use No    Review of Systems Patient denies headaches, rhinorrhea, blurry vision, numbness, shortness of breath, chest pain, edema, cough, abdominal pain, nausea, vomiting, diarrhea, dysuria, fevers, rashes or hallucinations unless otherwise stated above  in HPI. ____________________________________________   PHYSICAL EXAM:  VITAL SIGNS: Vitals:   02/24/17 1423  BP: (!) 107/46  Pulse: 72  Resp: 16  Temp: 98.9 F (37.2 C)  SpO2: 97%    Constitutional: Alert and oriented. Well appearing and in no acute distress. Eyes: Conjunctivae are normal.  Head: Atraumatic. Nose: No congestion/rhinnorhea. Mouth/Throat: Mucous membranes are moist.   Neck: No stridor. Painless ROM.  Cardiovascular: Normal rate, regular rhythm. Grossly normal heart sounds.  Good peripheral  circulation. Respiratory: Normal respiratory effort.  No retractions. Lungs CTAB. Gastrointestinal: Soft and nontender. No distention. No abdominal bruits. No CVA tenderness. Genitourinary:  Musculoskeletal: No lower extremity tenderness nor edema.  No joint effusions. Neurologic:  Normal speech and language. No gross focal neurologic deficits are appreciated. No facial droop Skin:  Skin is warm, dry and intact. No rash noted. Psychiatric: Mood and affect are normal. Speech and behavior are normal.  ____________________________________________   LABS (all labs ordered are listed, but only abnormal results are displayed)  Results for orders placed or performed during the hospital encounter of 02/24/17 (from the past 24 hour(s))  Glucose, capillary     Status: Abnormal   Collection Time: 02/24/17  2:19 PM  Result Value Ref Range   Glucose-Capillary 59 (L) 65 - 99 mg/dL  Glucose, capillary     Status: Abnormal   Collection Time: 02/24/17  2:37 PM  Result Value Ref Range   Glucose-Capillary 63 (L) 65 - 99 mg/dL   ____________________________________________  ____________________________________________  RADIOLOGY I personally reviewed all radiographic images ordered to evaluate for the above acute complaints and reviewed radiology reports and findings.  These findings were personally discussed with the patient.  Please see medical record for radiology report.  ____________________________________________   PROCEDURES  Procedure(s) performed:  Procedures    Critical Care performed: yes CRITICAL CARE Performed by: Merlyn Lot   Total critical care time: 30 minutes  Critical care time was exclusive of separately billable procedures and treating other patients.  Critical care was necessary to treat or prevent imminent or life-threatening deterioration.  Critical care was time spent personally by me on the following activities: development of treatment plan with  patient and/or surrogate as well as nursing, discussions with consultants, evaluation of patient's response to treatment, examination of patient, obtaining history from patient or surrogate, ordering and performing treatments and interventions, ordering and review of laboratory studies, ordering and review of radiographic studies, pulse oximetry and re-evaluation of patient's condition.  ____________________________________________   INITIAL IMPRESSION / ASSESSMENT AND PLAN / ED COURSE  Pertinent labs & imaging results that were available during my care of the patient were reviewed by me and considered in my medical decision making (see chart for details).  DDX: hypoglycemia, med reaction, dehydration, insulinoma, overdose, sepsis  Jacqueline [name] is a 82 y.o. who presents to the ED with Persistent hypoglycemia.  Patient is AFVSS in ED. Exam as above. Given current presentation have considered the above differential.  patient was given multiple oral glucose remedies in triage and upon arrival to the ER bed but still had persistent low glucose therefore she was given D50. Blood work sent for evaluation of the above differential shows mild hyponatremia but no other significant abnormalities. Based on this persistent hypoglycemia CT imaging was ordered that shows evidence of cirrhotic-appearing liver with mass concerning for hepatocellular carcinoma. Patient denies any history of hepatitis and is a nondrinker but is of Asian descent. Based on her presentation and my concern for paraneoplastic syndrome  and evaluated the patient should be observed in the hospital for frequent glucose checks at least as long as the oral metformin is still on board to ensure she does not have any persistent hypoglycemia. Patient will also benefit from consultation at some point for further evaluation of this liver abnormality.       ____________________________________________   FINAL CLINICAL IMPRESSION(S) / ED  DIAGNOSES  Final diagnoses:  Abnormal CT of the abdomen  Hypoglycemia  Abnormal liver CT      NEW MEDICATIONS STARTED DURING THIS VISIT:  New Prescriptions   No medications on file     Note:  This document was prepared using Dragon voice recognition software and may include unintentional dictation errors.    Merlyn Lot, MD 02/24/17 2002

## 2017-02-24 NOTE — ED Notes (Signed)
Patient given ice water and vanilla ice cream.  Daughter given diet ginger ale.

## 2017-02-24 NOTE — ED Notes (Signed)
Patient assisted to restroom and urine sample collected.

## 2017-02-24 NOTE — ED Notes (Signed)
MD stated hepatitis panel blood draw is not stat order.

## 2017-02-24 NOTE — ED Notes (Signed)
Pt given coke and ginger ale

## 2017-02-24 NOTE — ED Notes (Signed)
Stephen RN aware of bed assigned  

## 2017-02-24 NOTE — H&P (Signed)
[facility] NAME: Jacqueline [name]    MR#:  [id number]  DATE OF BIRTH:  1933/03/23  DATE OF ADMISSION:  02/24/2017  PRIMARY CARE PHYSICIAN: Tracie Harrier, MD   REQUESTING/REFERRING PHYSICIAN: Quentin Cornwall, MD  CHIEF COMPLAINT:   Chief Complaint  Patient presents with  . Hypoglycemia    HISTORY OF PRESENT ILLNESS:  Jacqueline [name]  is a 83 y.o. female who presents with persistently low blood glucose. This been going on for the past couple of days. Patient has had a good appetite. Here in the ED her blood glucose has responded to pulse dose dextrose, but then falls shortly after. On CT scan she was found to have a suspicious lesion on her liver. hospitalists were called for admission  PAST MEDICAL HISTORY:   Past Medical History:  Diagnosis Date  . CHF (congestive heart failure) ([place])   . Diabetes mellitus without complication ([place])   . Dilated idiopathic cardiomyopathy ([place])   . GERD (gastroesophageal reflux disease)   . Hyperlipidemia   . Hypertension   . Hypothyroidism   . Renal disorder     PAST SURGICAL HISTORY:   Past Surgical History:  Procedure Laterality Date  . BUNIONECTOMY    . COLONOSCOPY    . TONSILLECTOMY      SOCIAL HISTORY:   Social History  Substance Use Topics  . Smoking status: Former Research scientist (life sciences)  . Smokeless tobacco: Never Used  . Alcohol use No    FAMILY HISTORY:   Family History  Problem Relation Age of Onset  . Premature CHD Brother     DRUG ALLERGIES:   Allergies  Allergen Reactions  . Ciprofloxacin   . Digoxin And Related   . Erythromycin   . Lopid [Gemfibrozil]   . Nitrofurantoin   . Cefuroxime Rash  . Penicillins Rash    Has patient had a PCN reaction causing immediate rash, facial/tongue/throat swelling, SOB or lightheadedness with hypotension: Unknown Has patient had a PCN reaction causing severe rash involving mucus membranes or skin necrosis: No Has patient had a PCN  reaction that required hospitalization: No Has patient had a PCN reaction occurring within the last 10 years: No If all of the above answers are "NO", then may proceed with Cephalosporin use.     MEDICATIONS AT HOME:   Prior to Admission medications   Medication Sig Start Date End Date Taking? Authorizing Provider  aspirin EC 81 MG tablet Take 81 mg by mouth daily.   Yes [provider]  carvedilol (COREG) 12.5 MG tablet Take 12.5 mg by mouth 2 (two) times daily with a meal.   Yes [provider]  ergocalciferol (VITAMIN D2) 50000 units capsule Take 50,000 Units by mouth once a week.   Yes [provider]  fenofibrate (TRICOR) 145 MG tablet Take 145 mg by mouth daily.   Yes [provider]  furosemide (LASIX) 40 MG tablet Take 20-40 mg by mouth 2 (two) times daily. Take 40 mg in the morning and 20 mg in the evening.   Yes [provider]  Iron-FA-B Cmp-C-Biot-Probiotic (FUSION PLUS) CAPS Take 1 capsule by mouth daily.    Yes [provider]  levothyroxine (SYNTHROID, LEVOTHROID) 25 MCG tablet Take 25 mcg by mouth daily before breakfast.   Yes [provider]  meclizine (ANTIVERT) 25 MG tablet Take 12.5 mg by mouth 2 (two) times daily.   Yes [provider]  metFORMIN (GLUCOPHAGE) 500 MG tablet Take by mouth  2 (two) times daily with a meal.   Yes [provider]  pantoprazole (PROTONIX) 40 MG tablet Take 40 mg by mouth daily.   Yes [provider]  potassium chloride (K-DUR) 10 MEQ tablet Take 10 mEq by mouth daily.   Yes [provider]  ramipril (ALTACE) 10 MG capsule Take 20 mg by mouth daily.    Yes [provider]  sertraline (ZOLOFT) 50 MG tablet Take 50 mg by mouth daily.   Yes [provider]    REVIEW OF SYSTEMS:  Review of Systems  Constitutional: Positive for malaise/fatigue. Negative for chills, fever and weight loss.  HENT: Negative for ear pain, hearing loss  and tinnitus.   Eyes: Negative for blurred vision, double vision, pain and redness.  Respiratory: Negative for cough, hemoptysis and shortness of breath.   Cardiovascular: Negative for chest pain, palpitations, orthopnea and leg swelling.  Gastrointestinal: Negative for abdominal pain, constipation, diarrhea, nausea and vomiting.  Genitourinary: Negative for dysuria, frequency and hematuria.  Musculoskeletal: Negative for back pain, joint pain and neck pain.  Skin:       No acne, rash, or lesions  Neurological: Negative for dizziness, tremors, focal weakness and weakness.  Endo/Heme/Allergies: Negative for polydipsia. Does not bruise/bleed easily.  Psychiatric/Behavioral: Negative for depression. The patient is not nervous/anxious and does not have insomnia.      VITAL SIGNS:   Vitals:   02/24/17 2000 02/24/17 2015 02/24/17 2030 02/24/17 2045  BP: (!) 138/54 (!) 134/54 (!) 135/52 (!) 131/52  Pulse: 74 70  72  Resp: 16  16   Temp:      TempSrc:      SpO2: 99% 97%  96%  Weight:      Height:       Wt Readings from Last 3 Encounters:  02/24/17 56.2 kg (124 lb)  02/20/17 56.7 kg (124 lb 14.4 oz)    PHYSICAL EXAMINATION:  Physical Exam  Vitals reviewed. Constitutional: She is oriented to person, place, and time. She appears well-developed and well-nourished. No distress.  HENT:  Head: Normocephalic and atraumatic.  Mouth/Throat: Oropharynx is clear and moist.  Eyes: Pupils are equal, round, and reactive to light. Conjunctivae and EOM are normal. No scleral icterus.  Neck: Normal range of motion. Neck supple. No JVD present. No thyromegaly present.  Cardiovascular: Normal rate, regular rhythm and intact distal pulses.  Exam reveals no gallop and no friction rub.   No murmur heard. Respiratory: Effort normal and breath sounds normal. No respiratory distress. She has no wheezes. She has no rales.  GI: Soft. Bowel sounds are normal. She exhibits no distension. There is no  tenderness.  Musculoskeletal: Normal range of motion. She exhibits no edema.  No arthritis, no gout  Lymphadenopathy:    She has no cervical adenopathy.  Neurological: She is alert and oriented to person, place, and time. No cranial nerve deficit.  No dysarthria, no aphasia  Skin: Skin is warm and dry. No rash noted. No erythema.  Psychiatric: She has a normal mood and affect. Her behavior is normal. Judgment and thought content normal.    LABORATORY PANEL:   CBC  Recent Labs Lab 02/24/17 1438  WBC 5.1  HGB 12.1  HCT 35.2  PLT 211   ------------------------------------------------------------------------------------------------------------------  Chemistries   Recent Labs Lab 02/24/17 1438  NA 130*  K 4.1  CL 97*  CO2 25  GLUCOSE 58*  BUN 24*  CREATININE 0.97  CALCIUM 9.3  AST 45*  ALT 23  ALKPHOS 25*  BILITOT 0.8   ------------------------------------------------------------------------------------------------------------------  Cardiac Enzymes  Recent Labs Lab 02/18/17 1205  TROPONINI 0.04*   ------------------------------------------------------------------------------------------------------------------  RADIOLOGY:  Ct Abdomen Pelvis W Contrast  Result Date: 02/24/2017 CLINICAL DATA:  Pt to ED reporting hypoglycemia over the past three days. Pt was admitted to hospital over the weekend for dehydration and daughter reports since discharge pt's blood glucose has remained low. EXAM: CT ABDOMEN AND PELVIS WITH CONTRAST TECHNIQUE: Multidetector CT imaging of the abdomen and pelvis was performed using the standard protocol following bolus administration of intravenous contrast. CONTRAST:  39mL ISOVUE-300 IOPAMIDOL (ISOVUE-300) INJECTION 61% COMPARISON:  Ultrasound the abdomen 10/24/2009 FINDINGS: Lower chest: Lung bases are unremarkable. There is extensive coronary artery calcification. Heart is mildly enlarged. Hepatobiliary: There is nodular surface of the  liver. Vague low-attenuation is identified within the posterior right hepatic lobe, suspicious for infiltrated lesion. This region measures 7.9 x 3.8 cm. An irregular hypodense lesion is identified in the posterior segment right hepatic lobe measuring 1.4 x 1.2 cm on image 16 of series 2. Pancreas: Unremarkable. No pancreatic ductal dilatation or surrounding inflammatory changes. Spleen: A 5 mm low-attenuation lesion is identified in the lower portion of the spleen, likely representing a benign cyst. Adrenals/Urinary Tract: Normal adrenal glands. Kidneys are symmetric in size, enhancement, and excretion. No focal renal mass or hydronephrosis. Stomach/Bowel: The stomach and small bowel loops are normal in appearance. The appendix is well seen and has a normal appearance. Loops of large bowel are normal in appearance. Vascular/Lymphatic: There is moderate atherosclerosis of the abdominal aorta not associated with aneurysm. No retroperitoneal or mesenteric adenopathy. Reproductive: Uterus is present. No adnexal mass. Somewhat prominent patent bilateral gonadal veins incidentally noted. Other: No free pelvic fluid. Anterior abdominal wall is unremarkable. Musculoskeletal: No acute or significant osseous findings. IMPRESSION: 1. Cirrhotic morphology of the liver. 2. Low-attenuation lesions within the right hepatic lobe are suspicious for hepatocellular carcinoma. Further evaluation with MRI of the liver is recommended. MRI should be performed when the patient is clinically stable and able to follow breath holding instructions (usually best performed on an outpatient basis). 3. No hydronephrosis. 4. Normal appendix. 5. No bowel obstruction. 6.  Aortic atherosclerosis.  (ICD10-I70.0) Electronically Signed   By: Nolon Nations M.D.   On: 02/24/2017 18:54    EKG:   Orders placed or performed during the hospital encounter of 02/18/17  . ED EKG  . ED EKG  . EKG 12-Lead  . EKG 12-Lead    IMPRESSION AND PLAN:   Principal Problem:   Hypoglycemia - IV dextrose, we will give 10% dextrose so that we can give a very low rate of infusion as the patient has significant heart failure Active Problems:   Diabetes ([place]) - monitor her blood glucose levels, hold any antiplatelet glycemic for now   Chronic systolic CHF (congestive heart failure) ([facility]) - very cautious fluid administration as above, monitor symptoms very carefully, continue home meds   HTN (hypertension) - continue home meds   GERD (gastroesophageal reflux disease) - home dose PPI   Hypothyroidism - home dose thyroid replacement  All the records are reviewed and case discussed with ED provider. Management plans discussed with the patient and/or family.  DVT PROPHYLAXIS: SubQ lovenox  GI PROPHYLAXIS: PPI  ADMISSION STATUS: Observation  CODE STATUS: Full Code Status History    Date Active Date Inactive Code Status Order ID Comments User Context   02/18/2017  4:20 PM 02/20/2017  6:54 PM Full Code [id number]  Sudini,  Alveta Heimlich, MD ED    Advance Directive Documentation     Most Recent Value  Type of Advance Directive  Living will, Healthcare Power of Attorney  Pre-existing out of facility DNR order (yellow form or pink MOST form)  -  "MOST" Form in Place?  -      TOTAL TIME TAKING CARE OF THIS PATIENT: 40 minutes.   Aziah Brostrom [place] 02/24/2017, 9:22 PM  [facility]  Office  [phone]  CC: Primary care physician; Tracie Harrier, MD  Note:  This document was prepared using Dragon voice recognition software and may include unintentional dictation errors.

## 2017-02-24 NOTE — ED Notes (Signed)
Second half of D50 amp given at this time per MD.

## 2017-02-24 NOTE — ED Notes (Signed)
1/2 amp dextrose given at this time, other half held per Dr. Quentin Cornwall.

## 2017-02-24 NOTE — ED Triage Notes (Signed)
Pt to ED reporting hyperglycemia over the past three days. Pt was admitted to hospital over the weekend for dehydration and daughter reports since discharge pts blood glucose has remained low. Today daughter reports being unable to get CBG above 70 with PO intake. Home health care nurse came to pts home today and was also unable to get blood sugar elevated. Pt denies pain at this time and daughter reports pt has been eating and drinking normally. Pt currently on metformin but not insulin.

## 2017-02-25 ENCOUNTER — Telehealth: Payer: Self-pay | Admitting: Internal Medicine

## 2017-02-25 ENCOUNTER — Observation Stay: Payer: Medicare Other

## 2017-02-25 DIAGNOSIS — R162 Hepatomegaly with splenomegaly, not elsewhere classified: Secondary | ICD-10-CM

## 2017-02-25 DIAGNOSIS — R5383 Other fatigue: Secondary | ICD-10-CM

## 2017-02-25 DIAGNOSIS — I10 Essential (primary) hypertension: Secondary | ICD-10-CM

## 2017-02-25 DIAGNOSIS — N2889 Other specified disorders of kidney and ureter: Secondary | ICD-10-CM | POA: Diagnosis not present

## 2017-02-25 DIAGNOSIS — R531 Weakness: Secondary | ICD-10-CM

## 2017-02-25 DIAGNOSIS — E11649 Type 2 diabetes mellitus with hypoglycemia without coma: Secondary | ICD-10-CM | POA: Diagnosis not present

## 2017-02-25 DIAGNOSIS — I509 Heart failure, unspecified: Secondary | ICD-10-CM | POA: Diagnosis not present

## 2017-02-25 DIAGNOSIS — Z87891 Personal history of nicotine dependence: Secondary | ICD-10-CM

## 2017-02-25 DIAGNOSIS — E039 Hypothyroidism, unspecified: Secondary | ICD-10-CM

## 2017-02-25 DIAGNOSIS — Z79899 Other long term (current) drug therapy: Secondary | ICD-10-CM

## 2017-02-25 DIAGNOSIS — E785 Hyperlipidemia, unspecified: Secondary | ICD-10-CM

## 2017-02-25 DIAGNOSIS — E1165 Type 2 diabetes mellitus with hyperglycemia: Secondary | ICD-10-CM | POA: Diagnosis not present

## 2017-02-25 DIAGNOSIS — K219 Gastro-esophageal reflux disease without esophagitis: Secondary | ICD-10-CM | POA: Diagnosis not present

## 2017-02-25 DIAGNOSIS — Z7982 Long term (current) use of aspirin: Secondary | ICD-10-CM

## 2017-02-25 LAB — GLUCOSE, CAPILLARY
GLUCOSE-CAPILLARY: 118 mg/dL — AB (ref 65–99)
GLUCOSE-CAPILLARY: 178 mg/dL — AB (ref 65–99)
Glucose-Capillary: 106 mg/dL — ABNORMAL HIGH (ref 65–99)
Glucose-Capillary: 111 mg/dL — ABNORMAL HIGH (ref 65–99)
Glucose-Capillary: 122 mg/dL — ABNORMAL HIGH (ref 65–99)
Glucose-Capillary: 89 mg/dL (ref 65–99)
Glucose-Capillary: 95 mg/dL (ref 65–99)

## 2017-02-25 LAB — CBC
HEMATOCRIT: 32.2 % — AB (ref 35.0–47.0)
Hemoglobin: 11.2 g/dL — ABNORMAL LOW (ref 12.0–16.0)
MCH: 31.2 pg (ref 26.0–34.0)
MCHC: 34.9 g/dL (ref 32.0–36.0)
MCV: 89.5 fL (ref 80.0–100.0)
Platelets: 175 10*3/uL (ref 150–440)
RBC: 3.59 MIL/uL — AB (ref 3.80–5.20)
RDW: 14.3 % (ref 11.5–14.5)
WBC: 4.5 10*3/uL (ref 3.6–11.0)

## 2017-02-25 LAB — BASIC METABOLIC PANEL
Anion gap: 7 (ref 5–15)
BUN: 18 mg/dL (ref 6–20)
CHLORIDE: 100 mmol/L — AB (ref 101–111)
CO2: 26 mmol/L (ref 22–32)
Calcium: 8.9 mg/dL (ref 8.9–10.3)
Creatinine, Ser: 0.97 mg/dL (ref 0.44–1.00)
GFR calc Af Amer: >60 mL/min (ref >60)
GFR calc non Af Amer: 53 mL/min — ABNORMAL LOW (ref >60)
Glucose, Bld: 101 mg/dL — ABNORMAL HIGH (ref 65–99)
POTASSIUM: 3.8 mmol/L (ref 3.5–5.1)
Sodium: 133 mmol/L — ABNORMAL LOW (ref 135–145)

## 2017-02-25 MED ORDER — ACETAMINOPHEN 650 MG RE SUPP: 650.0000 mg | Freq: Four times a day (QID) | RECTAL | Status: DC | PRN

## 2017-02-25 MED ORDER — CARVEDILOL 6.25 MG PO TABS
12.5000 mg | ORAL_TABLET | Freq: Two times a day (BID) | ORAL | Status: DC
  Administered 2017-02-25 (×2): 12.5 mg via ORAL
  Filled 2017-02-25 (×2): qty 2
  Filled 2017-02-25 (×2): qty 4

## 2017-02-25 MED ORDER — RAMIPRIL 10 MG PO CAPS
20.0000 mg | ORAL_CAPSULE | Freq: Every day | ORAL | Status: DC
  Administered 2017-02-25: 20 mg via ORAL
  Filled 2017-02-25: qty 2

## 2017-02-25 MED ORDER — ONDANSETRON HCL 4 MG PO TABS: 4.0000 mg | ORAL_TABLET | Freq: Four times a day (QID) | ORAL | Status: DC | PRN

## 2017-02-25 MED ORDER — ENOXAPARIN SODIUM 40 MG/0.4ML ~~LOC~~ SOLN
40.0000 mg | SUBCUTANEOUS | Status: DC
  Administered 2017-02-25: 40 mg via SUBCUTANEOUS
  Filled 2017-02-25: qty 0.4

## 2017-02-25 MED ORDER — PANTOPRAZOLE SODIUM 40 MG PO TBEC
40.0000 mg | DELAYED_RELEASE_TABLET | Freq: Every day | ORAL | Status: DC
  Administered 2017-02-25: 40 mg via ORAL
  Filled 2017-02-25: qty 1

## 2017-02-25 MED ORDER — SERTRALINE HCL 50 MG PO TABS
50.0000 mg | ORAL_TABLET | Freq: Every day | ORAL | Status: DC
  Administered 2017-02-25: 50 mg via ORAL
  Filled 2017-02-25: qty 1

## 2017-02-25 MED ORDER — FUROSEMIDE 40 MG PO TABS
40.0000 mg | ORAL_TABLET | Freq: Every morning | ORAL | Status: DC
  Administered 2017-02-25: 40 mg via ORAL
  Filled 2017-02-25: qty 1

## 2017-02-25 MED ORDER — GADOBENATE DIMEGLUMINE 529 MG/ML IV SOLN
10.0000 mL | Freq: Once | INTRAVENOUS | Status: AC | PRN
  Administered 2017-02-25: 10 mL via INTRAVENOUS

## 2017-02-25 MED ORDER — LEVOTHYROXINE SODIUM 50 MCG PO TABS
25.0000 ug | ORAL_TABLET | Freq: Every day | ORAL | Status: DC
  Administered 2017-02-25: 25 ug via ORAL
  Filled 2017-02-25: qty 1

## 2017-02-25 MED ORDER — FUROSEMIDE 40 MG PO TABS: 20.0000 mg | ORAL_TABLET | Freq: Two times a day (BID) | ORAL | Status: DC

## 2017-02-25 MED ORDER — ACETAMINOPHEN 325 MG PO TABS
650.0000 mg | ORAL_TABLET | Freq: Four times a day (QID) | ORAL | Status: DC | PRN
  Administered 2017-02-25: 650 mg via ORAL
  Filled 2017-02-25: qty 2

## 2017-02-25 MED ORDER — ASPIRIN EC 81 MG PO TBEC
81.0000 mg | DELAYED_RELEASE_TABLET | Freq: Every day | ORAL | Status: DC
  Administered 2017-02-25: 81 mg via ORAL
  Filled 2017-02-25: qty 1

## 2017-02-25 MED ORDER — ONDANSETRON HCL 4 MG/2ML IJ SOLN: 4.0000 mg | Freq: Four times a day (QID) | INTRAMUSCULAR | Status: DC | PRN

## 2017-02-25 MED ORDER — FUROSEMIDE 20 MG PO TABS
20.0000 mg | ORAL_TABLET | Freq: Every evening | ORAL | Status: DC
  Administered 2017-02-25: 20 mg via ORAL
  Filled 2017-02-25: qty 1

## 2017-02-25 NOTE — Care Management Note (Signed)
Case Management Note  Patient Details  Name: Jacqueline [name] MRN: [id number] Date of Birth: 09/30/1932  Subjective/Objective: Discharging to no acute needs. Home health will resume.                   Action/Plan:   Expected Discharge Date:  02/25/17               Expected Discharge Plan:  [place]  In-House Referral:     Discharge planning Services  CM Consult  Post Acute Care Choice:  Home Health, Resumption of Svcs/PTA Provider Choice offered to:     DME Arranged:    DME Agency:     HH Arranged:    HH Agency:  [facility]  Status of Service:  In process, will continue to follow  If discussed at Long Length of Stay Meetings, dates discussed:    Additional Comments:  Jolly Mango, RN 02/25/2017, 10:44 AM

## 2017-02-25 NOTE — ED Notes (Signed)
Report called to floor nurse nikki rn

## 2017-02-25 NOTE — ED Notes (Signed)
Pt transported to room 159 

## 2017-02-25 NOTE — Progress Notes (Signed)
Patient D/C home in the care of son and daughter. D/C instructions reviewed with patient and family. Clothing and personal items in the possession of the patient. IV removed.

## 2017-02-25 NOTE — Discharge Instructions (Signed)
Fall precaution. Resume HHPT Heart healthy and ADA diet.

## 2017-02-25 NOTE — Care Management Note (Signed)
Case Management Note  Patient Details  Name: Jacqueline [name] MRN: [id number] Date of Birth: 16-Mar-1933  Subjective/Objective:  Spoke with daughter. Discussed PT recommendations of SNF. Explained observation notice again. She declines SNF for her mother, stating they are not able to pay out of pocket. She feels her mothers "sudden lethargy" was due to the fact that she didn't want to work with therapy.                  Action/Plan: Proceed with plan of home health  Expected Discharge Date:  02/25/17               Expected Discharge Plan:  [place]  In-House Referral:     Discharge planning Services  CM Consult  Post Acute Care Choice:  Home Health, Resumption of Svcs/PTA Provider Choice offered to:     DME Arranged:    DME Agency:     HH Arranged:    HH Agency:  [facility]  Status of Service:  In process, will continue to follow  If discussed at Long Length of Stay Meetings, dates discussed:    Additional Comments:  Jolly Mango, RN 02/25/2017, 2:26 PM

## 2017-02-25 NOTE — Evaluation (Signed)
Physical Therapy Evaluation Patient Details Name: Jacqueline [name] MRN: [id number] DOB: 10-Jan-1933 Today's Date: 02/25/2017   History of Present Illness  Pt is a 83 y/o F who presented with persistently low blood glucose.  Pt admitted for hypoglycemia.  On CT scan she was found to have a suspicious lesion on her liver.  Pt's PMH includes CHF.    Clinical Impression  Pt admitted with above diagnosis. Pt currently with functional limitations due to the deficits listed below (see PT Problem List).  Jacqueline [name] initially alert and agreeable to work with therapy.  She ambulated 35 ft with RW and assist required for safety and stability.  She requires assist for sit<>stand transfers as well.  After ambulation pt sitting in chair when she has an apparent sudden overwhelming episode of lethargy/fatigue. All VSS.  See general notes below for more detail. Given pt's current mobility status, recommending SNF at d/c.  Pt will benefit from skilled PT to increase their independence and safety with mobility to allow discharge to the venue listed below.      Follow Up Recommendations SNF;Supervision/Assistance - 24 hour    Equipment Recommendations  Other (comment) (TBD pending confirmation of equipment at home)    Recommendations for Other Services       Precautions / Restrictions Precautions Precautions: Fall Restrictions Weight Bearing Restrictions: No      Mobility  Bed Mobility               General bed mobility comments: Pt sitting EOB upon PT arrival and in chair at end of session  Transfers Overall transfer level: Needs assistance Equipment used: Rolling walker (2 wheeled) Transfers: Sit to/from Stand Sit to Stand: Min assist         General transfer comment: Cues for proper hand placement and safe technique using RW.  Assist to boost to standing from bed x1 and from commode x1.   Ambulation/Gait Ambulation/Gait assistance: Min assist Ambulation Distance (Feet): 35  Feet Assistive device: Rolling walker (2 wheeled) Gait Pattern/deviations: Decreased stride length;Step-through pattern;Shuffle;Trunk flexed Gait velocity: decreased Gait velocity interpretation: Below normal speed for age/gender General Gait Details: Pt demonstrates a flexed posture which improves only temporarily following verbal cues.  She requires cues and assist for proper RW managment as she pushes it too far ahead and additionally leaves it behind when performing turns when preparing to sit.  Pt declines ambulating farther due to fatigue.  Stairs            Wheelchair Mobility    Modified Rankin (Stroke Patients Only)       Balance Overall balance assessment: Needs assistance;History of Falls Sitting-balance support: No upper extremity supported;Feet supported Sitting balance-Leahy Scale: Good     Standing balance support: Single extremity supported;During functional activity Standing balance-Leahy Scale: Poor Standing balance comment: Pt relies on at least 1UE support with static and dynamic activities                             Pertinent Vitals/Pain Pain Assessment: Faces Faces Pain Scale: Hurts little more Pain Location: chronic low back pain Pain Descriptors / Indicators: Discomfort Pain Intervention(s): Limited activity within patient's tolerance;Monitored during session;Repositioned    Home Living Family/patient expects to be discharged to:: Private residence Living Arrangements: Children (son) Available Help at Discharge: Family;Available PRN/intermittently (son at work during the day) Type of Home: Other(Comment) (Condo) Home Access: Ramped entrance     [place]: One level  Home Equipment: [place] - 4 wheels;Wheelchair - power;Walker - 2 wheels      Prior Function Level of Independence: Needs assistance      ADL's / Homemaking Assistance Needed: Pt reports that an aid assists her with bathing.    Comments: Pt unable to provide any  additional information (see general comments below)     Hand Dominance        Extremity/Trunk Assessment   Upper Extremity Assessment Upper Extremity Assessment:  (BUE strength grossly 4-/5)    Lower Extremity Assessment Lower Extremity Assessment:  (BLE strength grossly 4-/5)       Communication   Communication: Other (comment) (question language barrier at times)  Cognition Arousal/Alertness: Awake/alert;Lethargic (Alert initially, significantly lethargic at end of session) Behavior During Therapy: Flat affect Overall Cognitive Status: Within Functional Limits for tasks assessed                                        General Comments General comments (skin integrity, edema, etc.): After pt ambulated from her bed to the bathroom and then to her chair she is sitting answering questions regarding home layout when she suddenly appears overwhelmed with fatigue and leans back completely in her chair and closes her eyes.  Pt was alert prior to this episode but suddenly was extremely lethargic.  RN notified immediately.  She was able to answer orientation questions correctly but did begin demonstrating a R lateral lean to the point of resting her head on the windowsill to her R, requiring assist to reposition.  Symmetrical smile, able to squeeze hands symmetrically and ankle pump symmetrically.  Vitals taken: BP 144/60, SpO2 98%, pulse 82, blood sugar 119, 98 deg F temp.  RN paged Dr. Bridgett Larsson who reported to room and assessed pt and decided to order an MRI of the brain.  Pt continued to present lethargic but oriented and Dr. Bridgett Larsson suggested to RN staff to continue to watch closely.  Pt resting in chair in semi-reclined position at end of PT session.     Exercises     Assessment/Plan    PT Assessment Patient needs continued PT services  PT Problem List Decreased strength;Decreased activity tolerance;Decreased balance;Decreased knowledge of use of DME;Decreased safety  awareness;Pain       PT Treatment Interventions DME instruction;Gait training;Stair training;Functional mobility training;Therapeutic activities;Therapeutic exercise;Balance training;Neuromuscular re-education;Patient/family education;Wheelchair mobility training    PT Goals (Current goals can be found in the Care Plan section)  Acute Rehab PT Goals Patient Stated Goal: to get stronger PT Goal Formulation: With patient Time For Goal Achievement: 03/11/17 Potential to Achieve Goals: Good    Frequency Min 2X/week   Barriers to discharge Decreased caregiver support Pt alone during the day    Co-evaluation               AM-PAC PT "6 Clicks" Daily Activity  Outcome Measure Difficulty turning over in bed (including adjusting bedclothes, sheets and blankets)?: A Lot Difficulty moving from lying on back to sitting on the side of the bed? : A Lot Difficulty sitting down on and standing up from a chair with arms (e.g., wheelchair, bedside commode, etc,.)?: Unable Help needed moving to and from a bed to chair (including a wheelchair)?: A Little Help needed walking in hospital room?: A Little Help needed climbing 3-5 steps with a railing? : A Lot 6 Click Score: 13    End of  Session Equipment Utilized During Treatment: Gait belt Activity Tolerance: Patient limited by lethargy;Patient limited by fatigue Patient left: in chair;with call bell/phone within reach;with chair alarm set Nurse Communication: Mobility status;Other (comment) (see general comments above) PT Visit Diagnosis: Muscle weakness (generalized) (M62.81);History of falling (Z91.81);Unsteadiness on feet (R26.81);Other abnormalities of gait and mobility (R26.89);Difficulty in walking, not elsewhere classified (R26.2)    Time: 0923-3007 (No charge for time taking vitals and consulting with MD) PT Time Calculation (min) (ACUTE ONLY): 41 min   Charges:   PT Evaluation $PT Eval Moderate Complexity: 1 Mod PT  Treatments $Gait Training: 8-22 mins   PT G Codes:   PT G-Codes **NOT FOR INPATIENT CLASS** Functional Assessment Tool Used: AM-PAC 6 Clicks Basic Mobility;Clinical judgement Functional Limitation: Mobility: Walking and moving around Mobility: Walking and Moving Around Current Status (M2263): At least 40 percent but less than 60 percent impaired, limited or restricted Mobility: Walking and Moving Around Goal Status [phone]): At least 1 percent but less than 20 percent impaired, limited or restricted    Collie Siad PT, DPT 02/25/2017, 2:15 PM

## 2017-02-25 NOTE — Care Management Note (Addendum)
Case Management Note  Patient Details  Name: Jacqueline [name] MRN: [id number] Date of Birth: 1933/06/16  Subjective/Objective:   Received notification that patient is active for home health with [facility] for nursing and PT.  She lives at home. Her grandson is home with her at night. Patient has a walker and electric wheelchair. PCP is Dr. Ginette Pitman. Recently here for weakeness and dehydration.Seen at [facility] yesterday for hypoglycemia.                  Action/Plan: Following progression.   Expected Discharge Date:                  Expected Discharge Plan:  [place]  In-House Referral:     Discharge planning Services  CM Consult  Post Acute Care Choice:  [place], Resumption of Svcs/PTA Provider Choice offered to:     DME Arranged:    DME Agency:     HH Arranged:   nursing and PT [place]  Status of Service:  In process, will continue to follow  If discussed at Long Length of Stay Meetings, dates discussed:    Additional Comments:  Jolly Mango, RN 02/25/2017, 9:34 AM

## 2017-02-25 NOTE — Care Management Note (Signed)
Case Management Note  Patient Details  Name: Jacqueline [name] MRN: [id number] Date of Birth: 02-06-1933  Subjective/Objective:                    Action/Plan:   Expected Discharge Date:  02/25/17               Expected Discharge Plan:  [place]  In-House Referral:     Discharge planning Services  CM Consult  Post Acute Care Choice:  Home Health, Resumption of Svcs/PTA Provider Choice offered to:     DME Arranged:    DME Agency:     HH Arranged:    HH Agency:  [place]  Status of Service:  In process, will continue to follow  If discussed at Long Length of Stay Meetings, dates discussed:    Additional Comments:  Jolly Mango, RN 02/25/2017, 10:44 AM

## 2017-02-25 NOTE — Discharge Summary (Addendum)
[place] [facility] [place] NAME: Jacqueline [name]    MR#:  [id number]  DATE OF BIRTH:  [birth year]  DATE OF ADMISSION:  02/24/2017   ADMITTING PHYSICIAN: Lance Coon, MD  DATE OF DISCHARGE: 02/25/2017  PRIMARY CARE PHYSICIAN: Tracie Harrier, MD   ADMISSION DIAGNOSIS:  Hypoglycemia [E16.2] Abnormal CT of the abdomen [R93.5] Abnormal liver CT [R93.2] DISCHARGE DIAGNOSIS:  Principal Problem:   Hypoglycemia Active Problems:   Diabetes (HCC)   Chronic systolic CHF (congestive heart failure) (HCC)   HTN (hypertension)   GERD (gastroesophageal reflux disease)   Hypothyroidism  SECONDARY DIAGNOSIS:   Past Medical History:  Diagnosis Date  . CHF (congestive heart failure) ([place])   . Diabetes mellitus without complication ([place])   . Dilated idiopathic cardiomyopathy ([place])   . GERD (gastroesophageal reflux disease)   . Hyperlipidemia   . Hypertension   . Hypothyroidism   . Renal disorder    HOSPITAL COURSE:  Hypoglycemia  Improved with IV dextrose and 10% dextrose infusion   Diabetes. She is on sliding scale. metformin is discontinued.  Hyponatremia is improving.  Chronic systolic CHF (congestive heart failure) Stable. Discontinue D10 IV infusion, continue home meds   HTN (hypertension) - continue home meds   GERD (gastroesophageal reflux disease) - home dose PPI   Hypothyroidism - home dose thyroid replacement  Liver lesion per abdomen CT, suspected hepatic carcinoma MRI of the abdomen is pending. Oncology consult is pending.  I discussed with Dr. Rogue Bussing.  Generalized weakness. PT evaluation suggests skilled nursing facility placement. According to Education officer, museum, the patient cannot be discharged to skilled nursing facility due to insurance. The patient will be discharged to home with home health and PT.  DISCHARGE CONDITIONS:  Stable, the patient will be discharged to home with home health and PT. CONSULTS OBTAINED:    Treatment Team:  Cammie Sickle, MD DRUG ALLERGIES:   Allergies  Allergen Reactions  . Ciprofloxacin   . Digoxin And Related   . Erythromycin   . Lopid [Gemfibrozil]   . Nitrofurantoin   . Cefuroxime Rash  . Penicillins Rash    Has patient had a PCN reaction causing immediate rash, facial/tongue/throat swelling, SOB or lightheadedness with hypotension: Unknown Has patient had a PCN reaction causing severe rash involving mucus membranes or skin necrosis: No Has patient had a PCN reaction that required hospitalization: No Has patient had a PCN reaction occurring within the last 10 years: No If all of the above answers are "NO", then may proceed with Cephalosporin use.    DISCHARGE MEDICATIONS:   Allergies as of 02/25/2017      Reactions   Ciprofloxacin    Digoxin And Related    Erythromycin    Lopid [gemfibrozil]    Nitrofurantoin    Cefuroxime Rash   Penicillins Rash   Has patient had a PCN reaction causing immediate rash, facial/tongue/throat swelling, SOB or lightheadedness with hypotension: Unknown Has patient had a PCN reaction causing severe rash involving mucus membranes or skin necrosis: No Has patient had a PCN reaction that required hospitalization: No Has patient had a PCN reaction occurring within the last 10 years: No If all of the above answers are "NO", then may proceed with Cephalosporin use.      Medication List    STOP taking these medications   metFORMIN 500 MG tablet Commonly known as:  GLUCOPHAGE     TAKE these medications   aspirin EC 81 MG tablet Take 81 mg  by mouth daily.   carvedilol 12.5 MG tablet Commonly known as:  COREG Take 12.5 mg by mouth 2 (two) times daily with a meal.   ergocalciferol 50000 units capsule Commonly known as:  VITAMIN D2 Take 50,000 Units by mouth once a week.   fenofibrate 145 MG tablet Commonly known as:  TRICOR Take 145 mg by mouth daily.   furosemide 40 MG tablet Commonly known as:  LASIX Take  20-40 mg by mouth 2 (two) times daily. Take 40 mg in the morning and 20 mg in the evening.   FUSION PLUS Caps Take 1 capsule by mouth daily.   levothyroxine 25 MCG tablet Commonly known as:  SYNTHROID, LEVOTHROID Take 25 mcg by mouth daily before breakfast.   meclizine 25 MG tablet Commonly known as:  ANTIVERT Take 12.5 mg by mouth 2 (two) times daily.   pantoprazole 40 MG tablet Commonly known as:  PROTONIX Take 40 mg by mouth daily.   potassium chloride 10 MEQ tablet Commonly known as:  K-DUR Take 10 mEq by mouth daily.   ramipril 10 MG capsule Commonly known as:  ALTACE Take 20 mg by mouth daily.   sertraline 50 MG tablet Commonly known as:  ZOLOFT Take 50 mg by mouth daily.            Discharge Care Instructions        Start     Ordered   02/25/17 0000  Increase activity slowly     02/25/17 1022   02/25/17 0000  Diet - low sodium heart healthy     02/25/17 1022   02/25/17 0000  Increase activity slowly     02/25/17 1548   02/25/17 0000  Diet - low sodium heart healthy     02/25/17 1548       DISCHARGE INSTRUCTIONS:  See AVS. If you experience worsening of your admission symptoms, develop shortness of breath, life threatening emergency, suicidal or homicidal thoughts you must seek medical attention immediately by calling 911 or calling your MD immediately  if symptoms less severe.  You Must read complete instructions/literature along with all the possible adverse reactions/side effects for all the Medicines you take and that have been prescribed to you. Take any new Medicines after you have completely understood and accpet all the possible adverse reactions/side effects.   Please note  You were cared for by a hospitalist during your hospital stay. If you have any questions about your discharge medications or the care you received while you were in the hospital after you are discharged, you can call the unit and asked to speak with the hospitalist on call  if the hospitalist that took care of you is not available. Once you are discharged, your primary care physician will handle any further medical issues. Please note that NO REFILLS for any discharge medications will be authorized once you are discharged, as it is imperative that you return to your primary care physician (or establish a relationship with a primary care physician if you do not have one) for your aftercare needs so that they can reassess your need for medications and monitor your lab values.    On the day of Discharge:  VITAL SIGNS:  Blood pressure (!) 144/60, pulse 82, temperature 98 F (36.7 C), temperature source Oral, resp. rate 16, height 5\' 1"  (1.549 m), weight 124 lb 14.4 oz (56.7 kg), SpO2 98 %. PHYSICAL EXAMINATION:  GENERAL:  83 y.o.-year-old patient lying in the bed with no acute distress.  EYES: Pupils equal, round, reactive to light and accommodation. No scleral icterus. Extraocular muscles intact.  HEENT: Head atraumatic, normocephalic. Oropharynx and nasopharynx clear.  NECK:  Supple, no jugular venous distention. No thyroid enlargement, no tenderness.  LUNGS: Normal breath sounds bilaterally, no wheezing, rales,rhonchi or crepitation. No use of accessory muscles of respiration.  CARDIOVASCULAR: S1, S2 normal. No murmurs, rubs, or gallops.  ABDOMEN: Soft, non-tender, non-distended. Bowel sounds present. No organomegaly or mass.  EXTREMITIES: No pedal edema, cyanosis, or clubbing.  NEUROLOGIC: Cranial nerves II through XII are intact. Muscle strength 4/5 in all extremities. Sensation intact. Gait not checked.  PSYCHIATRIC: The patient is alert and oriented x 3.  SKIN: No obvious rash, lesion, or ulcer.  DATA REVIEW:   CBC  Recent Labs Lab 02/25/17 0534  WBC 4.5  HGB 11.2*  HCT 32.2*  PLT 175    Chemistries   Recent Labs Lab 02/24/17 1438 02/25/17 0534  NA 130* 133*  K 4.1 3.8  CL 97* 100*  CO2 25 26  GLUCOSE 58* 101*  BUN 24* 18  CREATININE 0.97  0.97  CALCIUM 9.3 8.9  AST 45*  --   ALT 23  --   ALKPHOS 25*  --   BILITOT 0.8  --      Microbiology Results  No results found for this or any previous visit.  RADIOLOGY:  Ct Abdomen Pelvis W Contrast  Result Date: 02/24/2017 CLINICAL DATA:  Pt to ED reporting hypoglycemia over the past three days. Pt was admitted to hospital over the weekend for dehydration and daughter reports since discharge pt's blood glucose has remained low. EXAM: CT ABDOMEN AND PELVIS WITH CONTRAST TECHNIQUE: Multidetector CT imaging of the abdomen and pelvis was performed using the standard protocol following bolus administration of intravenous contrast. CONTRAST:  55mL ISOVUE-300 IOPAMIDOL (ISOVUE-300) INJECTION 61% COMPARISON:  Ultrasound the abdomen 10/24/2009 FINDINGS: Lower chest: Lung bases are unremarkable. There is extensive coronary artery calcification. Heart is mildly enlarged. Hepatobiliary: There is nodular surface of the liver. Vague low-attenuation is identified within the posterior right hepatic lobe, suspicious for infiltrated lesion. This region measures 7.9 x 3.8 cm. An irregular hypodense lesion is identified in the posterior segment right hepatic lobe measuring 1.4 x 1.2 cm on image 16 of series 2. Pancreas: Unremarkable. No pancreatic ductal dilatation or surrounding inflammatory changes. Spleen: A 5 mm low-attenuation lesion is identified in the lower portion of the spleen, likely representing a benign cyst. Adrenals/Urinary Tract: Normal adrenal glands. Kidneys are symmetric in size, enhancement, and excretion. No focal renal mass or hydronephrosis. Stomach/Bowel: The stomach and small bowel loops are normal in appearance. The appendix is well seen and has a normal appearance. Loops of large bowel are normal in appearance. Vascular/Lymphatic: There is moderate atherosclerosis of the abdominal aorta not associated with aneurysm. No retroperitoneal or mesenteric adenopathy. Reproductive: Uterus is  present. No adnexal mass. Somewhat prominent patent bilateral gonadal veins incidentally noted. Other: No free pelvic fluid. Anterior abdominal wall is unremarkable. Musculoskeletal: No acute or significant osseous findings. IMPRESSION: 1. Cirrhotic morphology of the liver. 2. Low-attenuation lesions within the right hepatic lobe are suspicious for hepatocellular carcinoma. Further evaluation with MRI of the liver is recommended. MRI should be performed when the patient is clinically stable and able to follow breath holding instructions (usually best performed on an outpatient basis). 3. No hydronephrosis. 4. Normal appendix. 5. No bowel obstruction. 6.  Aortic atherosclerosis.  (ICD10-I70.0) Electronically Signed   By: Everardo Beals.D.  On: 02/24/2017 18:54     Management plans discussed with the patient, her daughter and they are in agreement.  CODE STATUS: Full Code   TOTAL TIME TAKING CARE OF THIS PATIENT: 36 minutes.    Demetrios Loll M.D on 02/25/2017 at 3:50 PM  Between 7am to 6pm - Pager - [phone]  After 6pm go to [email or web address] - Proofreader  [facility]  Office  [phone]  CC: Primary care physician; Tracie Harrier, MD   Note: This dictation was prepared with Dragon dictation along with smaller phrase technology. Any transcriptional errors that result from this process are unintentional.

## 2017-02-25 NOTE — Telephone Encounter (Signed)
I tried to reach pt's family- Ms.[name] taylor. Left message.

## 2017-02-25 NOTE — Telephone Encounter (Signed)
Spoke to pt's daughter at length. Will await results of MRI.

## 2017-02-25 NOTE — Consult Note (Signed)
[facility] NOTE  Patient Care Team: Tracie Harrier, MD as PCP - General (Internal Medicine)  CHIEF COMPLAINTS/PURPOSE OF CONSULTATION:  Liver mass  HISTORY OF PRESENTING ILLNESS: Please note patient feels extremely tired/fatigue- and hence unable to participate to give history. No family around.  Jacqueline [name] 83 y.o.  female the long-standing history of diabetes/congestive heart failure- currently admitted to the hospital for hypoglycemic attacks. Incidentally, while in the emergency room patient received a CT scan of the abdomen that showed- suspicious mass in the liver. Patient is currently admitted to the hospital for further workup/and also to treat underlying hypoglycemia.  As per the physical therapist patient walked back and forth but all of a sudden started feeling weak; slouched in the chair. Currently being evaluated by Dr. Bridgett Larsson.   ROS: Unable to assess.   MEDICAL HISTORY:  Past Medical History:  Diagnosis Date  . CHF (congestive heart failure) ([place])   . Diabetes mellitus without complication ([place])   . Dilated idiopathic cardiomyopathy ([place])   . GERD (gastroesophageal reflux disease)   . Hyperlipidemia   . Hypertension   . Hypothyroidism   . Renal disorder     SURGICAL HISTORY: Past Surgical History:  Procedure Laterality Date  . BUNIONECTOMY    . COLONOSCOPY    . TONSILLECTOMY      SOCIAL HISTORY: Lives by herself in [place]; no smoking or alcohol. Social History   Social History  . Marital status: Widowed    Spouse name: N/A  . Number of children: N/A  . Years of education: N/A   Occupational History  . Not on file.   Social History Main Topics  . Smoking status: Former Research scientist (life sciences)  . Smokeless tobacco: Never Used  . Alcohol use No  . Drug use: No  . Sexual activity: No   Other Topics Concern  . Not on file   Social History Narrative  . No narrative on file    FAMILY HISTORY: Family History  Problem Relation Age of  Onset  . Premature CHD Brother     ALLERGIES:  is allergic to ciprofloxacin; digoxin and related; erythromycin; lopid [gemfibrozil]; nitrofurantoin; cefuroxime; and penicillins.  MEDICATIONS:  Current Facility-Administered Medications  Medication Dose Route Frequency Provider Last Rate Last Dose  . acetaminophen (TYLENOL) tablet 650 mg  650 mg Oral Q6H PRN Lance Coon, MD       Or  . acetaminophen (TYLENOL) suppository 650 mg  650 mg Rectal Q6H PRN Lance Coon, MD      . aspirin EC tablet 81 mg  81 mg Oral Daily Lance Coon, MD   81 mg at 02/25/17 0843  . carvedilol (COREG) tablet 12.5 mg  12.5 mg Oral BID WC Lance Coon, MD   12.5 mg at 02/25/17 0843  . enoxaparin (LOVENOX) injection 40 mg  40 mg Subcutaneous Q24H Lance Coon, MD   40 mg at 02/25/17 0842  . furosemide (LASIX) tablet 40 mg  40 mg Oral q morning - 10a Lance Coon, MD   40 mg at 02/25/17 0844   And  . furosemide (LASIX) tablet 20 mg  20 mg Oral QPM Lance Coon, MD      . levothyroxine (SYNTHROID, LEVOTHROID) tablet 25 mcg  25 mcg Oral QAC breakfast Lance Coon, MD   25 mcg at 02/25/17 0844  . ondansetron (ZOFRAN) tablet 4 mg  4 mg Oral Q6H PRN Lance Coon, MD       Or  . ondansetron [facility]) injection 4 mg  4 mg Intravenous Q6H PRN Lance Coon, MD      . pantoprazole (PROTONIX) EC tablet 40 mg  40 mg Oral Daily Lance Coon, MD   40 mg at 02/25/17 0843  . ramipril (ALTACE) capsule 20 mg  20 mg Oral Daily Lance Coon, MD   20 mg at 02/25/17 0843  . sertraline (ZOLOFT) tablet 50 mg  50 mg Oral Daily Lance Coon, MD   50 mg at 02/25/17 0843      .  PHYSICAL EXAMINATION:  Vitals:   02/25/17 0843 02/25/17 1234  BP: (!) 143/62 (!) 144/60  Pulse: 70 82  Resp: 16   Temp: 98.6 F (37 C) 98 F (36.7 C)  SpO2: 96% 98%   Filed Weights   02/24/17 1424 02/25/17 0030  Weight: 124 lb (56.2 kg) 124 lb 14.4 oz (56.7 kg)    GENERAL: Well-nourished well-developed; Sleepy, no distress and comfortable.     EYES: no pallor or icterus OROPHARYNX: no thrush or ulceration. NECK: supple, no masses felt LYMPH:  no palpable lymphadenopathy in the cervical, axillary or inguinal regions LUNGS: decreased breath sounds to auscultation at bases and  No wheeze or crackles HEART/CVS: regular rate & rhythm and no murmurs; No lower extremity edema ABDOMEN: abdomen soft, non-tender and normal bowel sounds Musculoskeletal:no cyanosis of digits and no clubbing  NEURO: no focal motor/sensory deficits; generalized weakness. SKIN:  no rashes or significant lesions  LABORATORY DATA:  I have reviewed the data as listed Lab Results  Component Value Date   WBC 4.5 02/25/2017   HGB 11.2 (L) 02/25/2017   HCT 32.2 (L) 02/25/2017   MCV 89.5 02/25/2017   PLT 175 02/25/2017    Recent Labs  02/20/17 0609 02/24/17 1438 02/25/17 0534  NA 138 130* 133*  K 4.1 4.1 3.8  CL 108 97* 100*  CO2 24 25 26   GLUCOSE 90 58* 101*  BUN 37* 24* 18  CREATININE 1.01* 0.97 0.97  CALCIUM 8.9 9.3 8.9  GFRNONAA 50* 53* 53*  GFRAA 58* >60 >60  PROT  --  7.2  --   ALBUMIN  --  4.3  --   AST  --  45*  --   ALT  --  23  --   ALKPHOS  --  25*  --   BILITOT  --  0.8  --     RADIOGRAPHIC STUDIES: I have personally reviewed the radiological images as listed and agreed with the findings in the report. Ct Abdomen Pelvis W Contrast  Result Date: 02/24/2017 CLINICAL DATA:  Pt to ED reporting hypoglycemia over the past three days. Pt was admitted to hospital over the weekend for dehydration and daughter reports since discharge pt's blood glucose has remained low. EXAM: CT ABDOMEN AND PELVIS WITH CONTRAST TECHNIQUE: Multidetector CT imaging of the abdomen and pelvis was performed using the standard protocol following bolus administration of intravenous contrast. CONTRAST:  35mL ISOVUE-300 IOPAMIDOL (ISOVUE-300) INJECTION 61% COMPARISON:  Ultrasound the abdomen 10/24/2009 FINDINGS: Lower chest: Lung bases are unremarkable. There is  extensive coronary artery calcification. Heart is mildly enlarged. Hepatobiliary: There is nodular surface of the liver. Vague low-attenuation is identified within the posterior right hepatic lobe, suspicious for infiltrated lesion. This region measures 7.9 x 3.8 cm. An irregular hypodense lesion is identified in the posterior segment right hepatic lobe measuring 1.4 x 1.2 cm on image 16 of series 2. Pancreas: Unremarkable. No pancreatic ductal dilatation or surrounding inflammatory changes. Spleen: A 5 mm low-attenuation lesion is identified  in the lower portion of the spleen, likely representing a benign cyst. Adrenals/Urinary Tract: Normal adrenal glands. Kidneys are symmetric in size, enhancement, and excretion. No focal renal mass or hydronephrosis. Stomach/Bowel: The stomach and small bowel loops are normal in appearance. The appendix is well seen and has a normal appearance. Loops of large bowel are normal in appearance. Vascular/Lymphatic: There is moderate atherosclerosis of the abdominal aorta not associated with aneurysm. No retroperitoneal or mesenteric adenopathy. Reproductive: Uterus is present. No adnexal mass. Somewhat prominent patent bilateral gonadal veins incidentally noted. Other: No free pelvic fluid. Anterior abdominal wall is unremarkable. Musculoskeletal: No acute or significant osseous findings. IMPRESSION: 1. Cirrhotic morphology of the liver. 2. Low-attenuation lesions within the right hepatic lobe are suspicious for hepatocellular carcinoma. Further evaluation with MRI of the liver is recommended. MRI should be performed when the patient is clinically stable and able to follow breath holding instructions (usually best performed on an outpatient basis). 3. No hydronephrosis. 4. Normal appendix. 5. No bowel obstruction. 6.  Aortic atherosclerosis.  (ICD10-I70.0) Electronically Signed   By: Nolon Nations M.D.   On: 02/24/2017 18:54    ASSESSMENT & PLAN:   # 83 year old female  patient currently admitted to the hospital for persistently low blood sugars- incidentally noted to have a liver mass.   # Large liver mass approximately 7 cm in size in the context of a cirrhotic liver highly suspicious of malignancy-like primary hepatocellular cancer. Awaiting MRI of the liver. Ordered AFP. Hepatitis panel pending. Role for biopsy based upon MRI imaging characteristics.   # Hypoglycemia- unclear etiology/question related liver lesion. Discontinue metformin.  # Discussed with [name], pt's daughter. Discussed with Dr. Bridgett Larsson.  # I reviewed the blood work- with the patient in detail; also reviewed the imaging independently [as summarized above]; and with the patient in detail.   # Will call patient's daughter- regarding the results of MRI once available/ also will need follow-up plan.    Cammie Sickle, MD 02/25/2017 4:27 PM

## 2017-02-26 ENCOUNTER — Telehealth: Payer: Self-pay | Admitting: Internal Medicine

## 2017-02-26 LAB — HEPATITIS PANEL, ACUTE
HEP A IGM: NEGATIVE
HEP B S AG: NEGATIVE
Hep B C IgM: NEGATIVE

## 2017-02-26 LAB — AFP TUMOR MARKER: AFP, Serum, Tumor Marker: 16.4 ng/mL — ABNORMAL HIGH (ref 0.0–8.3)

## 2017-02-26 NOTE — Telephone Encounter (Signed)
Please have patient follow up with me next week- labs-bmp only.

## 2017-02-27 ENCOUNTER — Telehealth: Payer: Self-pay | Admitting: Internal Medicine

## 2017-02-27 ENCOUNTER — Other Ambulatory Visit: Payer: Self-pay | Admitting: *Deleted

## 2017-02-27 DIAGNOSIS — K769 Liver disease, unspecified: Secondary | ICD-10-CM | POA: Insufficient documentation

## 2017-02-27 DIAGNOSIS — R160 Hepatomegaly, not elsewhere classified: Secondary | ICD-10-CM

## 2017-02-27 DIAGNOSIS — B0189 Other varicella complications: Secondary | ICD-10-CM

## 2017-02-27 NOTE — Telephone Encounter (Signed)
Lab orders entered

## 2017-02-27 NOTE — Telephone Encounter (Signed)
Spoke to patient's [name]- reviewed the MRI- [phone]; agrees with a CT scan of the liver.   Please schedule CT scan asap/prior to my appt; if not move her appt with me to few days later after the CT scan.   Pt has language barriers; please speak to patient's daughter.

## 2017-02-27 NOTE — Telephone Encounter (Signed)
Jacqueline [name], this is a [facility] f/u

## 2017-03-02 ENCOUNTER — Other Ambulatory Visit: Payer: Self-pay | Admitting: Internal Medicine

## 2017-03-02 NOTE — Telephone Encounter (Signed)
Jacqueline [name], please schedule schedule CT scan of liver asap/prior to md apt - see md notes below. Contact daughter with apts. Also Please identify/schedule the preferred language interpreter that patient needs for apts when you contact pt's daughter.

## 2017-03-04 ENCOUNTER — Ambulatory Visit
Admission: RE | Admit: 2017-03-04 | Discharge: 2017-03-04 | Disposition: A | Discharge status: Home / Self Care | Payer: Medicare Other | Source: Ambulatory Visit | Attending: Internal Medicine | Admitting: Internal Medicine

## 2017-03-04 DIAGNOSIS — I700 Atherosclerosis of aorta: Secondary | ICD-10-CM | POA: Insufficient documentation

## 2017-03-04 DIAGNOSIS — K769 Liver disease, unspecified: Secondary | ICD-10-CM | POA: Diagnosis not present

## 2017-03-04 MED ORDER — IOPAMIDOL (ISOVUE-370) INJECTION 76%
100.0000 mL | Freq: Once | INTRAVENOUS | Status: AC | PRN
Start: 2017-03-04 — End: 2017-03-04
  Administered 2017-03-04: 100 mL via INTRAVENOUS

## 2017-03-05 ENCOUNTER — Inpatient Hospital Stay: Payer: Medicare Other | Attending: Internal Medicine | Admitting: Internal Medicine

## 2017-03-05 ENCOUNTER — Telehealth: Payer: Self-pay | Admitting: Internal Medicine

## 2017-03-05 ENCOUNTER — Inpatient Hospital Stay: Payer: Medicare Other

## 2017-03-05 ENCOUNTER — Ambulatory Visit
Admission: RE | Admit: 2017-03-05 | Discharge: 2017-03-05 | Disposition: A | Discharge status: Home / Self Care | Payer: Medicare Other | Source: Ambulatory Visit | Attending: Internal Medicine | Admitting: Internal Medicine

## 2017-03-05 VITALS — BP 111/62 | HR 66 | Temp 97.1°F | Resp 16 | Wt 124.0 lb

## 2017-03-05 DIAGNOSIS — R34 Anuria and oliguria: Secondary | ICD-10-CM | POA: Insufficient documentation

## 2017-03-05 DIAGNOSIS — K7460 Unspecified cirrhosis of liver: Secondary | ICD-10-CM | POA: Diagnosis not present

## 2017-03-05 DIAGNOSIS — C801 Malignant (primary) neoplasm, unspecified: Secondary | ICD-10-CM | POA: Insufficient documentation

## 2017-03-05 DIAGNOSIS — I700 Atherosclerosis of aorta: Secondary | ICD-10-CM | POA: Diagnosis not present

## 2017-03-05 DIAGNOSIS — E785 Hyperlipidemia, unspecified: Secondary | ICD-10-CM | POA: Insufficient documentation

## 2017-03-05 DIAGNOSIS — M47894 Other spondylosis, thoracic region: Secondary | ICD-10-CM | POA: Insufficient documentation

## 2017-03-05 DIAGNOSIS — K769 Liver disease, unspecified: Secondary | ICD-10-CM

## 2017-03-05 DIAGNOSIS — M549 Dorsalgia, unspecified: Secondary | ICD-10-CM | POA: Diagnosis not present

## 2017-03-05 DIAGNOSIS — Z79899 Other long term (current) drug therapy: Secondary | ICD-10-CM | POA: Insufficient documentation

## 2017-03-05 DIAGNOSIS — E162 Hypoglycemia, unspecified: Secondary | ICD-10-CM | POA: Insufficient documentation

## 2017-03-05 DIAGNOSIS — I509 Heart failure, unspecified: Secondary | ICD-10-CM | POA: Diagnosis not present

## 2017-03-05 DIAGNOSIS — R978 Other abnormal tumor markers: Secondary | ICD-10-CM | POA: Insufficient documentation

## 2017-03-05 DIAGNOSIS — I420 Dilated cardiomyopathy: Secondary | ICD-10-CM | POA: Diagnosis not present

## 2017-03-05 DIAGNOSIS — Z7982 Long term (current) use of aspirin: Secondary | ICD-10-CM | POA: Diagnosis not present

## 2017-03-05 DIAGNOSIS — M419 Scoliosis, unspecified: Secondary | ICD-10-CM | POA: Diagnosis not present

## 2017-03-05 DIAGNOSIS — Z87891 Personal history of nicotine dependence: Secondary | ICD-10-CM | POA: Diagnosis not present

## 2017-03-05 DIAGNOSIS — I10 Essential (primary) hypertension: Secondary | ICD-10-CM | POA: Insufficient documentation

## 2017-03-05 DIAGNOSIS — K219 Gastro-esophageal reflux disease without esophagitis: Secondary | ICD-10-CM

## 2017-03-05 DIAGNOSIS — E119 Type 2 diabetes mellitus without complications: Secondary | ICD-10-CM

## 2017-03-05 DIAGNOSIS — E039 Hypothyroidism, unspecified: Secondary | ICD-10-CM | POA: Insufficient documentation

## 2017-03-05 LAB — CBC WITH DIFFERENTIAL/PLATELET
BASOS PCT: 0 %
Basophils Absolute: 0 10*3/uL (ref 0–0.1)
Eosinophils Absolute: 0.1 10*3/uL (ref 0–0.7)
Eosinophils Relative: 3 %
HEMATOCRIT: 31.5 % — AB (ref 35.0–47.0)
HEMOGLOBIN: 11 g/dL — AB (ref 12.0–16.0)
Lymphocytes Relative: 34 %
Lymphs Abs: 1 10*3/uL (ref 1.0–3.6)
MCH: 32 pg (ref 26.0–34.0)
MCHC: 34.8 g/dL (ref 32.0–36.0)
MCV: 91.8 fL (ref 80.0–100.0)
MONOS PCT: 10 %
Monocytes Absolute: 0.3 10*3/uL (ref 0.2–0.9)
NEUTROS ABS: 1.6 10*3/uL (ref 1.4–6.5)
NEUTROS PCT: 53 %
Platelets: 186 10*3/uL (ref 150–440)
RBC: 3.43 MIL/uL — AB (ref 3.80–5.20)
RDW: 14.3 % (ref 11.5–14.5)
WBC: 3.1 10*3/uL — AB (ref 3.6–11.0)

## 2017-03-05 LAB — COMPREHENSIVE METABOLIC PANEL
ALBUMIN: 4.2 g/dL (ref 3.5–5.0)
ALK PHOS: 25 U/L — AB (ref 38–126)
ALT: 22 U/L (ref 14–54)
ANION GAP: 8 (ref 5–15)
AST: 37 U/L (ref 15–41)
BILIRUBIN TOTAL: 0.9 mg/dL (ref 0.3–1.2)
BUN: 23 mg/dL — AB (ref 6–20)
CALCIUM: 9.3 mg/dL (ref 8.9–10.3)
CO2: 26 mmol/L (ref 22–32)
CREATININE: 0.96 mg/dL (ref 0.44–1.00)
Chloride: 95 mmol/L — ABNORMAL LOW (ref 101–111)
GFR calc Af Amer: >60 mL/min (ref >60)
GFR calc non Af Amer: 54 mL/min — ABNORMAL LOW (ref >60)
GLUCOSE: 112 mg/dL — AB (ref 65–99)
Potassium: 4.6 mmol/L (ref 3.5–5.1)
Sodium: 129 mmol/L — ABNORMAL LOW (ref 135–145)
TOTAL PROTEIN: 6.9 g/dL (ref 6.5–8.1)

## 2017-03-05 LAB — LACTATE DEHYDROGENASE: LDH: 152 U/L (ref 98–192)

## 2017-03-05 NOTE — Telephone Encounter (Signed)
Reviewed with tumor conference; liver lesions- difficult to assess on current imaging. Difficulty with following commands for MRI.   I would recommend PET scan for further evaluation/ to rule out other malignancies/primary.   I tried to reach the patient's daughter with above plan.  Drue Dun- please call the patient's daughter [name] with above plan; I have ordered the PET scan. We'll also set up appointment with me one to 2 days after the PET scan over the findings.

## 2017-03-05 NOTE — Progress Notes (Signed)
[facility] CONSULT NOTE  Patient Care Team: Tracie Harrier, MD as PCP - General (Internal Medicine)  CHIEF COMPLAINTS/PURPOSE OF CONSULTATION: Liver lesions/hypoglycemia  # sep 2018- Liver lesions/cirrhotic liver [AFp- 16]  # Hypoglycemia [sep 4034]  # EF- 20% [Dr.Kowalski- cath/ no blocks]  #   No history exists.     HISTORY OF PRESENTING ILLNESS: Patient speaks minimal English; interpreter/daughter.   Jacqueline [name] 83 y.o.  female recently admitted to the hospital for hypoglycemia; incidentally noted to have liver lesions is here for follow-up/review imaging.  As per the daughter, patient had been on oral medication for diabetes for many years. However approximately 2 years ago she was taken off one of the medication- as a blood sugars were getting lower. At the last admission-2-3 weeks ago she was taken off all her medications. As per the daughter patient still has episodes of hypoglycemia- manifested by headaches/flushing/cold sweats intermittently. Blood sugars have been ranging between 60s to 80s.   Intermittent headaches. Upper middle back pain- ongoing. No radiation. She has lost about 5-10 pounds in the last 6 months. Otherwise appetite is good.  ROS: A complete 10 point review of system is done which is negative except mentioned above in history of present illness  MEDICAL HISTORY:  Past Medical History:  Diagnosis Date  . CHF (congestive heart failure) ([place])   . Diabetes mellitus without complication ([place]) 74/25/9563   Currently no high BS.  Has come off meds.  But having episodes of low BS now.  . Dilated idiopathic cardiomyopathy ([place])   . GERD (gastroesophageal reflux disease)   . Hyperlipidemia   . Hypertension   . Hypothyroidism   . Renal disorder     SURGICAL HISTORY: Past Surgical History:  Procedure Laterality Date  . BUNIONECTOMY    . COLONOSCOPY    . TONSILLECTOMY      SOCIAL HISTORY: Social History   Social History  .  Marital status: Widowed    Spouse name: N/A  . Number of children: N/A  . Years of education: N/A   Occupational History  . Not on file.   Social History Main Topics  . Smoking status: Former Research scientist (life sciences)  . Smokeless tobacco: Never Used  . Alcohol use No  . Drug use: No  . Sexual activity: No   Other Topics Concern  . Not on file   Social History Narrative  . No narrative on file    FAMILY HISTORY: Family History  Problem Relation Age of Onset  . Premature CHD Brother     ALLERGIES:  is allergic to ciprofloxacin; digoxin and related; erythromycin; lopid [gemfibrozil]; nitrofurantoin; cefuroxime; and penicillins.  MEDICATIONS:  Current Outpatient Prescriptions  Medication Sig Dispense Refill  . aspirin EC 81 MG tablet Take 81 mg by mouth daily.    . carvedilol (COREG) 12.5 MG tablet Take 12.5 mg by mouth 2 (two) times daily with a meal.    . ergocalciferol (VITAMIN D2) 50000 units capsule Take 50,000 Units by mouth once a week.    . fenofibrate (TRICOR) 145 MG tablet Take 145 mg by mouth daily.    . furosemide (LASIX) 40 MG tablet Take 20-40 mg by mouth 2 (two) times daily. Take 40 mg in the morning and 20 mg in the evening.    . Iron-FA-B Cmp-C-Biot-Probiotic (FUSION PLUS) CAPS Take 1 capsule by mouth daily.     Marland Kitchen levothyroxine (SYNTHROID, LEVOTHROID) 25 MCG tablet Take 25 mcg by mouth daily before breakfast.    .  meclizine (ANTIVERT) 25 MG tablet Take 12.5 mg by mouth 2 (two) times daily.    . pantoprazole (PROTONIX) 40 MG tablet Take 40 mg by mouth daily.    . potassium chloride (K-DUR) 10 MEQ tablet Take 10 mEq by mouth daily.    . ramipril (ALTACE) 10 MG capsule Take 20 mg by mouth daily.     . sertraline (ZOLOFT) 50 MG tablet Take 50 mg by mouth daily.     No current facility-administered medications for this visit.       Marland Kitchen  PHYSICAL EXAMINATION: ECOG PERFORMANCE STATUS: 0 - Asymptomatic  Vitals:   03/05/17 0922  BP: 111/62  Pulse: 66  Resp: 16  Temp: (!)  97.1 F (36.2 C)   Filed Weights   03/05/17 0922  Weight: 124 lb (56.2 kg)    GENERAL: Well-nourished well-developed; Alert, no distress and comfortable.   With her daughter EYES: no pallor or icterus OROPHARYNX: no thrush or ulceration; good dentition  NECK: supple, no masses felt LYMPH:  no palpable lymphadenopathy in the cervical, axillary or inguinal regions LUNGS: clear to auscultation and  No wheeze or crackles HEART/CVS: regular rate & rhythm and no murmurs; No lower extremity edema ABDOMEN: abdomen soft, non-tender and normal bowel sounds Musculoskeletal:no cyanosis of digits and no clubbing  PSYCH: alert & oriented x 3 with fluent speech NEURO: no focal motor/sensory deficits SKIN:  no rashes or significant lesions  LABORATORY DATA:  I have reviewed the data as listed Lab Results  Component Value Date   WBC 3.1 (L) 03/05/2017   HGB 11.0 (L) 03/05/2017   HCT 31.5 (L) 03/05/2017   MCV 91.8 03/05/2017   PLT 186 03/05/2017    Recent Labs  02/24/17 1438 02/25/17 0534 03/05/17 1118  NA 130* 133* 129*  K 4.1 3.8 4.6  CL 97* 100* 95*  CO2 25 26 26   GLUCOSE 58* 101* 112*  BUN 24* 18 23*  CREATININE 0.97 0.97 0.96  CALCIUM 9.3 8.9 9.3  GFRNONAA 53* 53* 54*  GFRAA >60 >60 >60  PROT 7.2  --  6.9  ALBUMIN 4.3  --  4.2  AST 45*  --  37  ALT 23  --  22  ALKPHOS 25*  --  25*  BILITOT 0.8  --  0.9    RADIOGRAPHIC STUDIES: I have personally reviewed the radiological images as listed and agreed with the findings in the report. Dg Thoracic Spine 2 View  Result Date: 03/05/2017 CLINICAL DATA:  Dorsalgia, progressing EXAM: THORACIC SPINE 3 VIEWS COMPARISON:  Chest radiograph February 20, 2013 FINDINGS: Frontal, lateral, and swimmer's views obtained. There is no fracture or spondylolisthesis. Disc spaces appear normal. There are several anterior osteophytes in the lower thoracic region. No erosive change or paraspinous lesion. There is aortic atherosclerosis. IMPRESSION:  Mild osteoarthritic change at several levels. No fracture or spondylolisthesis. There is aortic atherosclerosis. Aortic Atherosclerosis (ICD10-I70.0). Electronically Signed   By: Lowella Grip III M.D.   On: 03/05/2017 15:15   Dg Lumbar Spine 2-3 Views  Result Date: 02/26/2017 CLINICAL DATA:  Low back pain. EXAM: LUMBAR SPINE - 2-3 VIEW COMPARISON:  CT abdomen and pelvis 02/24/2017. FINDINGS: Mild convex right scoliosis noted. Vertebral body height and alignment are normal. Intervertebral disc space height is normal. Mild facet degenerative disease L5-S1 noted. Aortic atherosclerosis is seen. IMPRESSION: Mild convex right scoliosis and facet degenerative disease at L5-S1. The lumbar spine is otherwise negative. Atherosclerosis. Electronically Signed   By: Inge Rise M.D.  On: 02/26/2017 00:39   Mr Abdomen W Wo Contrast  Result Date: 02/26/2017 CLINICAL DATA:  83 year old female with history of low blood glucose level. Suspicious lesions noted in the liver on prior CT the abdomen pelvis. Followup study. EXAM: MRI ABDOMEN WITHOUT AND WITH CONTRAST TECHNIQUE: Multiplanar multisequence MR imaging of the abdomen was performed both before and after the administration of intravenous contrast. CONTRAST:  57mL MULTIHANCE GADOBENATE DIMEGLUMINE 529 MG/ML IV SOLN COMPARISON:  No prior abdominal MRI. CT the abdomen and pelvis 02/24/2017. FINDINGS: Comment: Today's study is limited by considerable patient respiratory motion. Lower chest: Unremarkable. Hepatobiliary: Liver contour is shrunken and nodular in appearance, indicative of underlying cirrhosis. In the posterior aspect of segment 6 of the liver there is a 2.1 x 2.5 cm lesion which is very subtle on precontrast images demonstrating predominantly T1 hypointensity and minimal internal T2 hyperintensity, and appears hypovascular on postcontrast imaging (largely obscured by motion), incompletely evaluated. Notably, this lesion does not appear to be  hypervascular on the arterial phase images. The other suspected lesion in segment 7 of the liver on the prior CT examination is not confidently identified on today's motion limited examination. No other hepatic lesions are noted. No intra or extrahepatic biliary ductal dilatation. Gallbladder is normal in appearance. Pancreas: No pancreatic mass. No pancreatic ductal dilatation. No pancreatic or peripancreatic fluid or inflammatory changes. Spleen:  Unremarkable. Adrenals/Urinary Tract: Bilateral kidneys and bilateral adrenal glands are grossly normal in appearance on today's a with motion limited examination. No hydroureteronephrosis in the visualized portions of the abdomen. Stomach/Bowel: Visualized portions are unremarkable. Vascular/Lymphatic: Aortic atherosclerosis without definite aneurysm in the abdominal vasculature. No lymphadenopathy noted in the abdomen. Other: No significant volume of ascites in the visualized portions of the peritoneal cavity. Musculoskeletal: No aggressive osseous lesions are identified in the visualized portions of the skeleton. IMPRESSION: 1. Very limited study secondary to extensive patient respiratory motion. The appearance of the liver again suggests underlying cirrhosis. Only one of the two previously noted liver lesions is confidently identified on today's examination in segment 6 measuring 2.1 x 2.5 cm, and on today's limited study this has indeterminate imaging characteristics which are not at this time suggestive of hepatocellular carcinoma. The other lesion in segment 7 noted on the prior study is not confidently identified. Short-term follow-up abdominal MRI with and without IV gadolinium is recommended in 3 months assuming that the patient will be better able to hold her breath at that time. If the patient remains unable to properly hold her breath, further evaluation with liver protocol CT scan would be recommended, as this is less sensitive to patient motion. 2. Aortic  atherosclerosis. Electronically Signed   By: Vinnie Langton M.D.   On: 02/26/2017 08:23   Ct Abdomen Pelvis W Contrast  Result Date: 02/24/2017 CLINICAL DATA:  Pt to ED reporting hypoglycemia over the past three days. Pt was admitted to hospital over the weekend for dehydration and daughter reports since discharge pt's blood glucose has remained low. EXAM: CT ABDOMEN AND PELVIS WITH CONTRAST TECHNIQUE: Multidetector CT imaging of the abdomen and pelvis was performed using the standard protocol following bolus administration of intravenous contrast. CONTRAST:  1mL ISOVUE-300 IOPAMIDOL (ISOVUE-300) INJECTION 61% COMPARISON:  Ultrasound the abdomen 10/24/2009 FINDINGS: Lower chest: Lung bases are unremarkable. There is extensive coronary artery calcification. Heart is mildly enlarged. Hepatobiliary: There is nodular surface of the liver. Vague low-attenuation is identified within the posterior right hepatic lobe, suspicious for infiltrated lesion. This region measures 7.9 x 3.8  cm. An irregular hypodense lesion is identified in the posterior segment right hepatic lobe measuring 1.4 x 1.2 cm on image 16 of series 2. Pancreas: Unremarkable. No pancreatic ductal dilatation or surrounding inflammatory changes. Spleen: A 5 mm low-attenuation lesion is identified in the lower portion of the spleen, likely representing a benign cyst. Adrenals/Urinary Tract: Normal adrenal glands. Kidneys are symmetric in size, enhancement, and excretion. No focal renal mass or hydronephrosis. Stomach/Bowel: The stomach and small bowel loops are normal in appearance. The appendix is well seen and has a normal appearance. Loops of large bowel are normal in appearance. Vascular/Lymphatic: There is moderate atherosclerosis of the abdominal aorta not associated with aneurysm. No retroperitoneal or mesenteric adenopathy. Reproductive: Uterus is present. No adnexal mass. Somewhat prominent patent bilateral gonadal veins incidentally noted.  Other: No free pelvic fluid. Anterior abdominal wall is unremarkable. Musculoskeletal: No acute or significant osseous findings. IMPRESSION: 1. Cirrhotic morphology of the liver. 2. Low-attenuation lesions within the right hepatic lobe are suspicious for hepatocellular carcinoma. Further evaluation with MRI of the liver is recommended. MRI should be performed when the patient is clinically stable and able to follow breath holding instructions (usually best performed on an outpatient basis). 3. No hydronephrosis. 4. Normal appendix. 5. No bowel obstruction. 6.  Aortic atherosclerosis.  (ICD10-I70.0) Electronically Signed   By: Nolon Nations M.D.   On: 02/24/2017 18:54   Nm Pet Image Initial (pi) Skull Base To Thigh  Result Date: 03/13/2017 CLINICAL DATA:  Initial treatment strategy for hepatic masses. Cirrhosis. EXAM: NUCLEAR MEDICINE PET SKULL BASE TO THIGH TECHNIQUE: 12.5 mCi F-18 FDG was injected intravenously. Full-ring PET imaging was performed from the skull base to thigh after the radiotracer. CT data was obtained and used for attenuation correction and anatomic localization. FASTING BLOOD GLUCOSE:  Value: 94 mg/dl COMPARISON:  CT on 03/04/2017 FINDINGS: NECK:  No hypermetabolic lymph nodes or masses. CHEST: No hypermetabolic masses or lymphadenopathy. No suspicious pulmonary nodules seen on CT images. ABDOMEN/PELVIS: No abnormal hypermetabolic activity within the liver, pancreas, adrenal glands, or spleen. No hypermetabolic lymph nodes in the abdomen or pelvis. Hepatic cirrhosis is again demonstrated. No ascites. Aortic atherosclerosis. SKELETON: No focal hypermetabolic bone lesions to suggest skeletal metastasis. IMPRESSION: No evidence of hypermetabolic malignancy. Hepatic cirrhosis. Recommend continued imaging followup of previously seen hepatic lesions with CT or MRI. Electronically Signed   By: Earle Gell M.D.   On: 03/13/2017 14:19   Ct Liver Abdomen W Wo Contrast  Result Date:  03/04/2017 CLINICAL DATA:  Four day history of right lower quadrant pain. EXAM: CT ABDOMEN WITHOUT AND WITH CONTRAST TECHNIQUE: Multidetector CT imaging of the abdomen was performed following the standard protocol before and following the bolus administration of intravenous contrast. CONTRAST:  100 cc Isovue 370 COMPARISON:  02/24/2017 FINDINGS: Lower chest:  Unremarkable Hepatobiliary: Cirrhotic liver morphology. Multiple liver lesions again identified. 2.2 cm lesion in the inferior right liver is not hypervascular on arterial phase imaging. Two additional subcapsular lesions in the posterior aspect of the inferior right liver are similar appearance and measure 1.9 cm (image 40) and 2.4 cm (image 43) respectively. Neither shows arterial phase hyper enhancement. 8 mm low-density lesion posterior right hepatic dome is nonspecific. Third lesion is identified in the subcapsular aspect of the inferior right liver There is no evidence for gallstones, gallbladder wall thickening, or pericholecystic fluid. No intrahepatic or extrahepatic biliary dilation. Pancreas: No focal mass lesion. No dilatation of the main duct. No intraparenchymal cyst. No peripancreatic edema. Spleen: Tiny  hypoattenuating lesion in the inferior spleen is stable and likely a cyst or pseudocyst. Adrenals/Urinary Tract: No adrenal nodule or mass. No enhancing renal mass. No hydronephrosis. Stomach/Bowel: Stomach is nondistended. No gastric wall thickening. No evidence of outlet obstruction. Duodenum is normally positioned as is the ligament of Treitz. No small bowel or colonic dilatation. Vascular/Lymphatic: There is abdominal aortic atherosclerosis without aneurysm. There is no gastrohepatic or hepatoduodenal ligament lymphadenopathy. No intraperitoneal or retroperitoneal lymphadenopathy. Portal vein and superior mesenteric vein are patent. Other: No intraperitoneal free fluid. Musculoskeletal: Bone windows reveal no worrisome lytic or sclerotic  osseous lesions. IMPRESSION: Multiple nonspecific liver lesions on a background of cirrhotic change. Repeat imaging in 3 months is recommended to reassess and ensure stability. If the patient is able to reproducibly breath hold at that time, MRI would be the study of choice. The patient is unable to breath hold, multiphase liver CT recommended. Electronically Signed   By: Misty Stanley M.D.   On: 03/04/2017 11:16    ASSESSMENT & PLAN:   Liver lesion # Liver lesions in a cirrhotic liver. AFP elevated at 16; suspicious for malignancy. Patient unable to follow commands for MRI. Liver dedicated CT scan- showed 2 small lesions. Unclear etiology. Recommend PET scan for further evaluation.  #Hypoglycemia- unclear etiology; rare possibility of insulinoma also considered. Check insulin/C-peptide levels. If this continues to be a problem- recommend endocrinology evaluation.  # Above plan of care was discussed the patient/daughter in detail. Follow-up after the PET scan.   # Patient's case was also reviewed at the tumor conference.   # I reviewed the blood work- with the patient in detail; also reviewed the imaging independently [as summarized above]; and with the patient in detail.   All questions were answered. The patient knows to call the clinic with any problems, questions or concerns.    Cammie Sickle, MD 03/13/2017 7:30 PM

## 2017-03-05 NOTE — Progress Notes (Signed)
Patient is here today for a hospital follow up. Patient reports feel fatigued and dizzy "all the time."

## 2017-03-06 LAB — INSULIN AND C-PEPTIDE, SERUM
C-Peptide: 3.4 ng/mL (ref 1.1–4.4)
INSULIN: 4.7 u[IU]/mL (ref 2.6–24.9)

## 2017-03-06 LAB — CEA: CEA1: 3.4 ng/mL (ref 0.0–4.7)

## 2017-03-06 NOTE — Telephone Encounter (Signed)
I personally spoke with daughterMechele Claude. She gave verbal understanding of the plan of care. Pet scan to be ordered for next week and md apts. Pt is diabetic. Per daughter - pt usually has episodes of hypoglycemia. Pt's blood glucose levels stay in the 40's and 50's in am. Daughter educated on pet scan prep instructions.

## 2017-03-06 NOTE — Telephone Encounter (Signed)
Colette, please proceed to schedule pet scan/md f/u apts and collaborate with Mathis Fare. Thanks. Nira Conn

## 2017-03-09 NOTE — Progress Notes (Signed)
  Oncology Nurse Navigator Documentation Met with Ms. Earleen Newport and her [name]) during and after consult with Dr. Rogue Bussing. Introduced Therapist, nutritional and provided contact information for any future needs. Escorted to the lab and directions given to medical mall for xray today. Navigator Location: [facility] (03/09/17 1600)   )Navigator Encounter Type: Initial MedOnc (03/09/17 1600)                     Patient Visit Type: MedOnc;Initial (03/09/17 1600)                    Acuity: Level 2 (03/09/17 1600)         Time Spent with Patient: 60 (03/09/17 1600)

## 2017-03-11 ENCOUNTER — Ambulatory Visit: Payer: Medicare Other

## 2017-03-13 ENCOUNTER — Encounter
Admission: RE | Admit: 2017-03-13 | Discharge: 2017-03-13 | Disposition: A | Discharge status: Home / Self Care | Payer: Medicare Other | Source: Ambulatory Visit | Attending: Internal Medicine | Admitting: Internal Medicine

## 2017-03-13 DIAGNOSIS — K769 Liver disease, unspecified: Secondary | ICD-10-CM | POA: Insufficient documentation

## 2017-03-13 DIAGNOSIS — E119 Type 2 diabetes mellitus without complications: Secondary | ICD-10-CM

## 2017-03-13 DIAGNOSIS — C801 Malignant (primary) neoplasm, unspecified: Secondary | ICD-10-CM | POA: Diagnosis present

## 2017-03-13 HISTORY — DX: Type 2 diabetes mellitus without complications: E11.9

## 2017-03-13 LAB — GLUCOSE, CAPILLARY: Glucose-Capillary: 94 mg/dL (ref 65–99)

## 2017-03-13 MED ORDER — FLUDEOXYGLUCOSE F - 18 (FDG) INJECTION
12.4800 | Freq: Once | INTRAVENOUS | Status: AC | PRN
  Administered 2017-03-13: 12.48 via INTRAVENOUS

## 2017-03-13 NOTE — Assessment & Plan Note (Signed)
#   Liver lesions in a cirrhotic liver. AFP elevated at 16; suspicious for malignancy. Patient unable to follow commands for MRI. Liver dedicated CT scan- showed 2 small lesions. Unclear etiology. Recommend PET scan for further evaluation.  #Hypoglycemia- unclear etiology; rare possibility of insulinoma also considered. Check insulin/C-peptide levels. If this continues to be a problem- recommend endocrinology evaluation.  # Above plan of care was discussed the patient/daughter in detail. Follow-up after the PET scan.   # Patient's case was also reviewed at the tumor conference.   # I reviewed the blood work- with the patient in detail; also reviewed the imaging independently [as summarized above]; and with the patient in detail.

## 2017-03-16 ENCOUNTER — Telehealth: Payer: Self-pay | Admitting: Internal Medicine

## 2017-03-16 ENCOUNTER — Inpatient Hospital Stay: Payer: Medicare Other | Attending: Internal Medicine | Admitting: Internal Medicine

## 2017-03-16 VITALS — BP 148/61 | HR 70 | Temp 97.5°F | Resp 18 | Wt 127.0 lb

## 2017-03-16 DIAGNOSIS — E162 Hypoglycemia, unspecified: Secondary | ICD-10-CM | POA: Diagnosis not present

## 2017-03-16 DIAGNOSIS — Z7982 Long term (current) use of aspirin: Secondary | ICD-10-CM | POA: Insufficient documentation

## 2017-03-16 DIAGNOSIS — R51 Headache: Secondary | ICD-10-CM | POA: Diagnosis not present

## 2017-03-16 DIAGNOSIS — E119 Type 2 diabetes mellitus without complications: Secondary | ICD-10-CM | POA: Insufficient documentation

## 2017-03-16 DIAGNOSIS — I700 Atherosclerosis of aorta: Secondary | ICD-10-CM | POA: Insufficient documentation

## 2017-03-16 DIAGNOSIS — K769 Liver disease, unspecified: Secondary | ICD-10-CM | POA: Insufficient documentation

## 2017-03-16 DIAGNOSIS — K219 Gastro-esophageal reflux disease without esophagitis: Secondary | ICD-10-CM | POA: Insufficient documentation

## 2017-03-16 DIAGNOSIS — Z87891 Personal history of nicotine dependence: Secondary | ICD-10-CM | POA: Insufficient documentation

## 2017-03-16 DIAGNOSIS — M549 Dorsalgia, unspecified: Secondary | ICD-10-CM | POA: Insufficient documentation

## 2017-03-16 DIAGNOSIS — E785 Hyperlipidemia, unspecified: Secondary | ICD-10-CM | POA: Insufficient documentation

## 2017-03-16 DIAGNOSIS — I509 Heart failure, unspecified: Secondary | ICD-10-CM | POA: Insufficient documentation

## 2017-03-16 DIAGNOSIS — K7460 Unspecified cirrhosis of liver: Secondary | ICD-10-CM | POA: Diagnosis not present

## 2017-03-16 DIAGNOSIS — E039 Hypothyroidism, unspecified: Secondary | ICD-10-CM | POA: Insufficient documentation

## 2017-03-16 DIAGNOSIS — I10 Essential (primary) hypertension: Secondary | ICD-10-CM | POA: Insufficient documentation

## 2017-03-16 DIAGNOSIS — Z79899 Other long term (current) drug therapy: Secondary | ICD-10-CM | POA: Insufficient documentation

## 2017-03-16 DIAGNOSIS — C7A8 Other malignant neuroendocrine tumors: Secondary | ICD-10-CM

## 2017-03-16 DIAGNOSIS — M419 Scoliosis, unspecified: Secondary | ICD-10-CM | POA: Diagnosis not present

## 2017-03-16 DIAGNOSIS — I420 Dilated cardiomyopathy: Secondary | ICD-10-CM | POA: Diagnosis not present

## 2017-03-16 NOTE — Telephone Encounter (Signed)
Spoke to Dr.Solum from endocrinology; recommend referral to [facility].

## 2017-03-16 NOTE — Progress Notes (Signed)
Here for follow up

## 2017-03-16 NOTE — Assessment & Plan Note (Addendum)
#   Liver lesions in a cirrhotic liver. AFP elevated at 16; suspicious for malignancy. Liver dedicated CT scan- showed 2 small lesions. Reviewed at the tumor conference. PET- no metabolically active lesions noted. As per the recommendations from the tumor conference plan a repeat liver CT scan with; and without contrast in 3 months.  # cirrhosis- ? Etiology. Hep work up neg;? Fatty liver vs. Others. Recommend GI eval. Defer to PCP.   #Hypoglycemia- unclear etiology; rare possibility of insulinoma also considered. Insulin/C-peptide levels-Normal. Recommend endocrinology evaluation. Left message for [facility] endocrinology.   # follow up in 3 months/ CT scans prior; Labs-1 week prior.   # I reviewed the blood work- with the patient in detail; also reviewed the imaging independently [as summarized above]; and with the patient in detail.    # Addedndum: Discussed with Dr.Solum; [facility] endocrinology- kindly agrees to evaluate the patient.we will make a referral.

## 2017-03-16 NOTE — Progress Notes (Signed)
[place] CONSULT NOTE  Patient Care Team: Tracie Harrier, MD as PCP - General (Internal Medicine) Clent Jacks, RN as Registered Nurse  CHIEF COMPLAINTS/PURPOSE OF CONSULTATION: Liver lesions/hypoglycemia  # sep 2018- Liver lesions/cirrhotic liver [AFp- 16]  # Hypoglycemia [sep 1610]  # EF- 20% [Dr.Kowalski- cath/ no blocks]  #   No history exists.     HISTORY OF PRESENTING ILLNESS: Patient speaks minimal English; interpreter/daughter.   Jacqueline [name] 83 y.o.  female recently admitted to the hospital for hypoglycemia; incidentally noted to have liver lesions is here for follow-up/review imaging-PET scan.  As per the daughter patient continues to have episodes of hypoglycemia with blood sugars in 80s; no on any diabetic medications. Intermittent headaches. Upper middle back pain- ongoing. No radiation.  Otherwise appetite is good.  ROS: A complete 10 point review of system is done which is negative except mentioned above in history of present illness  MEDICAL HISTORY:  Past Medical History:  Diagnosis Date  . CHF (congestive heart failure) ([place])   . Diabetes mellitus without complication ([place]) 96/09/5407   Currently no high BS.  Has come off meds.  But having episodes of low BS now.  . Dilated idiopathic cardiomyopathy ([place])   . GERD (gastroesophageal reflux disease)   . Hyperlipidemia   . Hypertension   . Hypothyroidism   . Renal disorder     SURGICAL HISTORY: Past Surgical History:  Procedure Laterality Date  . BUNIONECTOMY    . COLONOSCOPY    . TONSILLECTOMY      SOCIAL HISTORY: Social History   Social History  . Marital status: Widowed    Spouse name: N/A  . Number of children: N/A  . Years of education: N/A   Occupational History  . Not on file.   Social History Main Topics  . Smoking status: Former Research scientist (life sciences)  . Smokeless tobacco: Never Used  . Alcohol use No  . Drug use: No  . Sexual activity: No   Other Topics Concern   . Not on file   Social History Narrative  . No narrative on file    FAMILY HISTORY: Family History  Problem Relation Age of Onset  . Premature CHD Brother     ALLERGIES:  is allergic to ciprofloxacin; digoxin and related; erythromycin; lopid [gemfibrozil]; nitrofurantoin; cefuroxime; and penicillins.  MEDICATIONS:  Current Outpatient Prescriptions  Medication Sig Dispense Refill  . aspirin EC 81 MG tablet Take 81 mg by mouth daily.    . carvedilol (COREG) 12.5 MG tablet Take 12.5 mg by mouth 2 (two) times daily with a meal.    . ergocalciferol (VITAMIN D2) 50000 units capsule Take 50,000 Units by mouth once a week.    . fenofibrate (TRICOR) 145 MG tablet Take 145 mg by mouth daily.    . furosemide (LASIX) 40 MG tablet Take 20-40 mg by mouth 2 (two) times daily. Take 40 mg in the morning and 20 mg in the evening.    Marland Kitchen glucose blood (FREESTYLE LITE) test strip USE TO CHECK BLOOD SUGARS TWICE A DAY    . Iron-FA-B Cmp-C-Biot-Probiotic (FUSION PLUS) CAPS Take 1 capsule by mouth daily.     Marland Kitchen levothyroxine (SYNTHROID, LEVOTHROID) 25 MCG tablet Take 25 mcg by mouth daily before breakfast.    . meclizine (ANTIVERT) 25 MG tablet Take 12.5 mg by mouth 2 (two) times daily.    . pantoprazole (PROTONIX) 40 MG tablet Take 40 mg by mouth daily.    . potassium chloride (K-DUR)  10 MEQ tablet Take 10 mEq by mouth daily.    . ramipril (ALTACE) 10 MG capsule Take 20 mg by mouth daily.     . sertraline (ZOLOFT) 50 MG tablet Take 50 mg by mouth daily.     No current facility-administered medications for this visit.       Marland Kitchen  PHYSICAL EXAMINATION: ECOG PERFORMANCE STATUS: 0 - Asymptomatic  Vitals:   03/16/17 1022  BP: (!) 148/61  Pulse: 70  Resp: 18  Temp: (!) 97.5 F (36.4 C)   Filed Weights   03/16/17 1022  Weight: 127 lb (57.6 kg)    GENERAL: Well-nourished well-developed; Alert, no distress and comfortable. Walking with a rolling walker.   With her daughter EYES: no pallor or  icterus OROPHARYNX: no thrush or ulceration; good dentition  NECK: supple, no masses felt LYMPH:  no palpable lymphadenopathy in the cervical, axillary or inguinal regions LUNGS: clear to auscultation and  No wheeze or crackles HEART/CVS: regular rate & rhythm and no murmurs; No lower extremity edema ABDOMEN: abdomen soft, non-tender and normal bowel sounds Musculoskeletal:no cyanosis of digits and no clubbing  PSYCH: alert & oriented x 3 with fluent speech NEURO: no focal motor/sensory deficits SKIN:  no rashes or significant lesions  LABORATORY DATA:  I have reviewed the data as listed Lab Results  Component Value Date   WBC 3.1 (L) 03/05/2017   HGB 11.0 (L) 03/05/2017   HCT 31.5 (L) 03/05/2017   MCV 91.8 03/05/2017   PLT 186 03/05/2017    Recent Labs  02/24/17 1438 02/25/17 0534 03/05/17 1118  NA 130* 133* 129*  K 4.1 3.8 4.6  CL 97* 100* 95*  CO2 25 26 26   GLUCOSE 58* 101* 112*  BUN 24* 18 23*  CREATININE 0.97 0.97 0.96  CALCIUM 9.3 8.9 9.3  GFRNONAA 53* 53* 54*  GFRAA >60 >60 >60  PROT 7.2  --  6.9  ALBUMIN 4.3  --  4.2  AST 45*  --  37  ALT 23  --  22  ALKPHOS 25*  --  25*  BILITOT 0.8  --  0.9    RADIOGRAPHIC STUDIES: I have personally reviewed the radiological images as listed and agreed with the findings in the report. Dg Thoracic Spine 2 View  Result Date: 03/05/2017 CLINICAL DATA:  Dorsalgia, progressing EXAM: THORACIC SPINE 3 VIEWS COMPARISON:  Chest radiograph February 20, 2013 FINDINGS: Frontal, lateral, and swimmer's views obtained. There is no fracture or spondylolisthesis. Disc spaces appear normal. There are several anterior osteophytes in the lower thoracic region. No erosive change or paraspinous lesion. There is aortic atherosclerosis. IMPRESSION: Mild osteoarthritic change at several levels. No fracture or spondylolisthesis. There is aortic atherosclerosis. Aortic Atherosclerosis (ICD10-I70.0). Electronically Signed   By: Lowella Grip III  M.D.   On: 03/05/2017 15:15   Dg Lumbar Spine 2-3 Views  Result Date: 02/26/2017 CLINICAL DATA:  Low back pain. EXAM: LUMBAR SPINE - 2-3 VIEW COMPARISON:  CT abdomen and pelvis 02/24/2017. FINDINGS: Mild convex right scoliosis noted. Vertebral body height and alignment are normal. Intervertebral disc space height is normal. Mild facet degenerative disease L5-S1 noted. Aortic atherosclerosis is seen. IMPRESSION: Mild convex right scoliosis and facet degenerative disease at L5-S1. The lumbar spine is otherwise negative. Atherosclerosis. Electronically Signed   By: Inge Rise M.D.   On: 02/26/2017 00:39   Mr Abdomen W Wo Contrast  Result Date: 02/26/2017 CLINICAL DATA:  83 year old female with history of low blood glucose level. Suspicious lesions noted  in the liver on prior CT the abdomen pelvis. Followup study. EXAM: MRI ABDOMEN WITHOUT AND WITH CONTRAST TECHNIQUE: Multiplanar multisequence MR imaging of the abdomen was performed both before and after the administration of intravenous contrast. CONTRAST:  34mL MULTIHANCE GADOBENATE DIMEGLUMINE 529 MG/ML IV SOLN COMPARISON:  No prior abdominal MRI. CT the abdomen and pelvis 02/24/2017. FINDINGS: Comment: Today's study is limited by considerable patient respiratory motion. Lower chest: Unremarkable. Hepatobiliary: Liver contour is shrunken and nodular in appearance, indicative of underlying cirrhosis. In the posterior aspect of segment 6 of the liver there is a 2.1 x 2.5 cm lesion which is very subtle on precontrast images demonstrating predominantly T1 hypointensity and minimal internal T2 hyperintensity, and appears hypovascular on postcontrast imaging (largely obscured by motion), incompletely evaluated. Notably, this lesion does not appear to be hypervascular on the arterial phase images. The other suspected lesion in segment 7 of the liver on the prior CT examination is not confidently identified on today's motion limited examination. No other  hepatic lesions are noted. No intra or extrahepatic biliary ductal dilatation. Gallbladder is normal in appearance. Pancreas: No pancreatic mass. No pancreatic ductal dilatation. No pancreatic or peripancreatic fluid or inflammatory changes. Spleen:  Unremarkable. Adrenals/Urinary Tract: Bilateral kidneys and bilateral adrenal glands are grossly normal in appearance on today's a with motion limited examination. No hydroureteronephrosis in the visualized portions of the abdomen. Stomach/Bowel: Visualized portions are unremarkable. Vascular/Lymphatic: Aortic atherosclerosis without definite aneurysm in the abdominal vasculature. No lymphadenopathy noted in the abdomen. Other: No significant volume of ascites in the visualized portions of the peritoneal cavity. Musculoskeletal: No aggressive osseous lesions are identified in the visualized portions of the skeleton. IMPRESSION: 1. Very limited study secondary to extensive patient respiratory motion. The appearance of the liver again suggests underlying cirrhosis. Only one of the two previously noted liver lesions is confidently identified on today's examination in segment 6 measuring 2.1 x 2.5 cm, and on today's limited study this has indeterminate imaging characteristics which are not at this time suggestive of hepatocellular carcinoma. The other lesion in segment 7 noted on the prior study is not confidently identified. Short-term follow-up abdominal MRI with and without IV gadolinium is recommended in 3 months assuming that the patient will be better able to hold her breath at that time. If the patient remains unable to properly hold her breath, further evaluation with liver protocol CT scan would be recommended, as this is less sensitive to patient motion. 2. Aortic atherosclerosis. Electronically Signed   By: Vinnie Langton M.D.   On: 02/26/2017 08:23   Ct Abdomen Pelvis W Contrast  Result Date: 02/24/2017 CLINICAL DATA:  Pt to ED reporting hypoglycemia over  the past three days. Pt was admitted to hospital over the weekend for dehydration and daughter reports since discharge pt's blood glucose has remained low. EXAM: CT ABDOMEN AND PELVIS WITH CONTRAST TECHNIQUE: Multidetector CT imaging of the abdomen and pelvis was performed using the standard protocol following bolus administration of intravenous contrast. CONTRAST:  86mL ISOVUE-300 IOPAMIDOL (ISOVUE-300) INJECTION 61% COMPARISON:  Ultrasound the abdomen 10/24/2009 FINDINGS: Lower chest: Lung bases are unremarkable. There is extensive coronary artery calcification. Heart is mildly enlarged. Hepatobiliary: There is nodular surface of the liver. Vague low-attenuation is identified within the posterior right hepatic lobe, suspicious for infiltrated lesion. This region measures 7.9 x 3.8 cm. An irregular hypodense lesion is identified in the posterior segment right hepatic lobe measuring 1.4 x 1.2 cm on image 16 of series 2. Pancreas: Unremarkable. No pancreatic  ductal dilatation or surrounding inflammatory changes. Spleen: A 5 mm low-attenuation lesion is identified in the lower portion of the spleen, likely representing a benign cyst. Adrenals/Urinary Tract: Normal adrenal glands. Kidneys are symmetric in size, enhancement, and excretion. No focal renal mass or hydronephrosis. Stomach/Bowel: The stomach and small bowel loops are normal in appearance. The appendix is well seen and has a normal appearance. Loops of large bowel are normal in appearance. Vascular/Lymphatic: There is moderate atherosclerosis of the abdominal aorta not associated with aneurysm. No retroperitoneal or mesenteric adenopathy. Reproductive: Uterus is present. No adnexal mass. Somewhat prominent patent bilateral gonadal veins incidentally noted. Other: No free pelvic fluid. Anterior abdominal wall is unremarkable. Musculoskeletal: No acute or significant osseous findings. IMPRESSION: 1. Cirrhotic morphology of the liver. 2. Low-attenuation lesions  within the right hepatic lobe are suspicious for hepatocellular carcinoma. Further evaluation with MRI of the liver is recommended. MRI should be performed when the patient is clinically stable and able to follow breath holding instructions (usually best performed on an outpatient basis). 3. No hydronephrosis. 4. Normal appendix. 5. No bowel obstruction. 6.  Aortic atherosclerosis.  (ICD10-I70.0) Electronically Signed   By: Nolon Nations M.D.   On: 02/24/2017 18:54   Nm Pet Image Initial (pi) Skull Base To Thigh  Result Date: 03/13/2017 CLINICAL DATA:  Initial treatment strategy for hepatic masses. Cirrhosis. EXAM: NUCLEAR MEDICINE PET SKULL BASE TO THIGH TECHNIQUE: 12.5 mCi F-18 FDG was injected intravenously. Full-ring PET imaging was performed from the skull base to thigh after the radiotracer. CT data was obtained and used for attenuation correction and anatomic localization. FASTING BLOOD GLUCOSE:  Value: 94 mg/dl COMPARISON:  CT on 03/04/2017 FINDINGS: NECK:  No hypermetabolic lymph nodes or masses. CHEST: No hypermetabolic masses or lymphadenopathy. No suspicious pulmonary nodules seen on CT images. ABDOMEN/PELVIS: No abnormal hypermetabolic activity within the liver, pancreas, adrenal glands, or spleen. No hypermetabolic lymph nodes in the abdomen or pelvis. Hepatic cirrhosis is again demonstrated. No ascites. Aortic atherosclerosis. SKELETON: No focal hypermetabolic bone lesions to suggest skeletal metastasis. IMPRESSION: No evidence of hypermetabolic malignancy. Hepatic cirrhosis. Recommend continued imaging followup of previously seen hepatic lesions with CT or MRI. Electronically Signed   By: Earle Gell M.D.   On: 03/13/2017 14:19   Ct Liver Abdomen W Wo Contrast  Result Date: 03/04/2017 CLINICAL DATA:  Four day history of right lower quadrant pain. EXAM: CT ABDOMEN WITHOUT AND WITH CONTRAST TECHNIQUE: Multidetector CT imaging of the abdomen was performed following the standard protocol  before and following the bolus administration of intravenous contrast. CONTRAST:  100 cc Isovue 370 COMPARISON:  02/24/2017 FINDINGS: Lower chest:  Unremarkable Hepatobiliary: Cirrhotic liver morphology. Multiple liver lesions again identified. 2.2 cm lesion in the inferior right liver is not hypervascular on arterial phase imaging. Two additional subcapsular lesions in the posterior aspect of the inferior right liver are similar appearance and measure 1.9 cm (image 40) and 2.4 cm (image 43) respectively. Neither shows arterial phase hyper enhancement. 8 mm low-density lesion posterior right hepatic dome is nonspecific. Third lesion is identified in the subcapsular aspect of the inferior right liver There is no evidence for gallstones, gallbladder wall thickening, or pericholecystic fluid. No intrahepatic or extrahepatic biliary dilation. Pancreas: No focal mass lesion. No dilatation of the main duct. No intraparenchymal cyst. No peripancreatic edema. Spleen: Tiny hypoattenuating lesion in the inferior spleen is stable and likely a cyst or pseudocyst. Adrenals/Urinary Tract: No adrenal nodule or mass. No enhancing renal mass. No hydronephrosis. Stomach/Bowel: Stomach  is nondistended. No gastric wall thickening. No evidence of outlet obstruction. Duodenum is normally positioned as is the ligament of Treitz. No small bowel or colonic dilatation. Vascular/Lymphatic: There is abdominal aortic atherosclerosis without aneurysm. There is no gastrohepatic or hepatoduodenal ligament lymphadenopathy. No intraperitoneal or retroperitoneal lymphadenopathy. Portal vein and superior mesenteric vein are patent. Other: No intraperitoneal free fluid. Musculoskeletal: Bone windows reveal no worrisome lytic or sclerotic osseous lesions. IMPRESSION: Multiple nonspecific liver lesions on a background of cirrhotic change. Repeat imaging in 3 months is recommended to reassess and ensure stability. If the patient is able to reproducibly  breath hold at that time, MRI would be the study of choice. The patient is unable to breath hold, multiphase liver CT recommended. Electronically Signed   By: Misty Stanley M.D.   On: 03/04/2017 11:16    ASSESSMENT & PLAN:   Liver lesion # Liver lesions in a cirrhotic liver. AFP elevated at 16; suspicious for malignancy. Liver dedicated CT scan- showed 2 small lesions. Reviewed at the tumor conference. PET- no metabolically active lesions noted. As per the recommendations from the tumor conference plan a repeat liver CT scan with; and without contrast in 3 months.  # cirrhosis- ? Etiology. Hep work up neg;? Fatty liver vs. Others. Recommend GI eval. Defer to PCP.   #Hypoglycemia- unclear etiology; rare possibility of insulinoma also considered. Insulin/C-peptide levels-Normal. Recommend endocrinology evaluation. Left message for [facility] endocrinology.   # follow up in 3 months/ CT scans prior; Labs-1 week prior.   # I reviewed the blood work- with the patient in detail; also reviewed the imaging independently [as summarized above]; and with the patient in detail.    # Addedndum: Discussed with Dr.Solum; [facility]- kindly agrees to evaluate the patient.we will make a referral.  All questions were answered. The patient knows to call the clinic with any problems, questions or concerns.    Cammie Sickle, MD 03/16/2017 1:30 PM

## 2017-06-01 ENCOUNTER — Other Ambulatory Visit: Payer: Self-pay

## 2017-06-01 ENCOUNTER — Inpatient Hospital Stay
Admission: EM | Admit: 2017-06-01 | Discharge: 2017-06-04 | DRG: 871 | Disposition: A | Discharge status: Home / Self Care | Payer: Medicare Other | Attending: Specialist | Admitting: Specialist

## 2017-06-01 ENCOUNTER — Emergency Department: Payer: Medicare Other

## 2017-06-01 ENCOUNTER — Encounter: Payer: Self-pay | Admitting: Emergency Medicine

## 2017-06-01 DIAGNOSIS — K219 Gastro-esophageal reflux disease without esophagitis: Secondary | ICD-10-CM | POA: Diagnosis present

## 2017-06-01 DIAGNOSIS — A419 Sepsis, unspecified organism: Secondary | ICD-10-CM | POA: Diagnosis present

## 2017-06-01 DIAGNOSIS — I130 Hypertensive heart and chronic kidney disease with heart failure and stage 1 through stage 4 chronic kidney disease, or unspecified chronic kidney disease: Secondary | ICD-10-CM | POA: Diagnosis present

## 2017-06-01 DIAGNOSIS — Z888 Allergy status to other drugs, medicaments and biological substances status: Secondary | ICD-10-CM | POA: Diagnosis not present

## 2017-06-01 DIAGNOSIS — Z87891 Personal history of nicotine dependence: Secondary | ICD-10-CM | POA: Diagnosis not present

## 2017-06-01 DIAGNOSIS — I248 Other forms of acute ischemic heart disease: Secondary | ICD-10-CM | POA: Diagnosis present

## 2017-06-01 DIAGNOSIS — I272 Pulmonary hypertension, unspecified: Secondary | ICD-10-CM | POA: Diagnosis present

## 2017-06-01 DIAGNOSIS — Z79899 Other long term (current) drug therapy: Secondary | ICD-10-CM

## 2017-06-01 DIAGNOSIS — E1122 Type 2 diabetes mellitus with diabetic chronic kidney disease: Secondary | ICD-10-CM | POA: Diagnosis present

## 2017-06-01 DIAGNOSIS — I509 Heart failure, unspecified: Secondary | ICD-10-CM

## 2017-06-01 DIAGNOSIS — E785 Hyperlipidemia, unspecified: Secondary | ICD-10-CM | POA: Diagnosis present

## 2017-06-01 DIAGNOSIS — E871 Hypo-osmolality and hyponatremia: Secondary | ICD-10-CM | POA: Diagnosis present

## 2017-06-01 DIAGNOSIS — I5023 Acute on chronic systolic (congestive) heart failure: Secondary | ICD-10-CM | POA: Diagnosis present

## 2017-06-01 DIAGNOSIS — Z880 Allergy status to penicillin: Secondary | ICD-10-CM

## 2017-06-01 DIAGNOSIS — R0902 Hypoxemia: Secondary | ICD-10-CM

## 2017-06-01 DIAGNOSIS — N183 Chronic kidney disease, stage 3 (moderate): Secondary | ICD-10-CM | POA: Diagnosis present

## 2017-06-01 DIAGNOSIS — I214 Non-ST elevation (NSTEMI) myocardial infarction: Secondary | ICD-10-CM | POA: Diagnosis present

## 2017-06-01 DIAGNOSIS — N3000 Acute cystitis without hematuria: Secondary | ICD-10-CM | POA: Diagnosis present

## 2017-06-01 DIAGNOSIS — Z881 Allergy status to other antibiotic agents status: Secondary | ICD-10-CM

## 2017-06-01 DIAGNOSIS — Z66 Do not resuscitate: Secondary | ICD-10-CM | POA: Diagnosis present

## 2017-06-01 DIAGNOSIS — Z79890 Hormone replacement therapy (postmenopausal): Secondary | ICD-10-CM

## 2017-06-01 DIAGNOSIS — F329 Major depressive disorder, single episode, unspecified: Secondary | ICD-10-CM | POA: Diagnosis present

## 2017-06-01 DIAGNOSIS — J9621 Acute and chronic respiratory failure with hypoxia: Secondary | ICD-10-CM | POA: Diagnosis not present

## 2017-06-01 DIAGNOSIS — I420 Dilated cardiomyopathy: Secondary | ICD-10-CM | POA: Diagnosis present

## 2017-06-01 DIAGNOSIS — C801 Malignant (primary) neoplasm, unspecified: Secondary | ICD-10-CM | POA: Diagnosis present

## 2017-06-01 DIAGNOSIS — E039 Hypothyroidism, unspecified: Secondary | ICD-10-CM | POA: Diagnosis present

## 2017-06-01 DIAGNOSIS — B9620 Unspecified Escherichia coli [E. coli] as the cause of diseases classified elsewhere: Secondary | ICD-10-CM | POA: Diagnosis present

## 2017-06-01 DIAGNOSIS — Z7982 Long term (current) use of aspirin: Secondary | ICD-10-CM

## 2017-06-01 LAB — PROCALCITONIN: Procalcitonin: 1.49 ng/mL

## 2017-06-01 LAB — URINALYSIS, COMPLETE (UACMP) WITH MICROSCOPIC
Bilirubin Urine: NEGATIVE
GLUCOSE, UA: NEGATIVE mg/dL
Ketones, ur: NEGATIVE mg/dL
NITRITE: POSITIVE — AB
PH: 5 (ref 5.0–8.0)
PROTEIN: 100 mg/dL — AB
Specific Gravity, Urine: 1.009 (ref 1.005–1.030)
Squamous Epithelial / LPF: NONE SEEN

## 2017-06-01 LAB — TSH: TSH: 2.629 u[IU]/mL (ref 0.350–4.500)

## 2017-06-01 LAB — COMPREHENSIVE METABOLIC PANEL
ALK PHOS: 48 U/L (ref 38–126)
ALT: 13 U/L — AB (ref 14–54)
AST: 33 U/L (ref 15–41)
Albumin: 3.7 g/dL (ref 3.5–5.0)
Anion gap: 8 (ref 5–15)
BUN: 51 mg/dL — ABNORMAL HIGH (ref 6–20)
CALCIUM: 8.7 mg/dL — AB (ref 8.9–10.3)
CO2: 25 mmol/L (ref 22–32)
CREATININE: 1.48 mg/dL — AB (ref 0.44–1.00)
Chloride: 97 mmol/L — ABNORMAL LOW (ref 101–111)
GFR, EST AFRICAN AMERICAN: 37 mL/min — AB (ref >60)
GFR, EST NON AFRICAN AMERICAN: 32 mL/min — AB (ref >60)
Glucose, Bld: 260 mg/dL — ABNORMAL HIGH (ref 65–99)
Potassium: 4.8 mmol/L (ref 3.5–5.1)
Sodium: 130 mmol/L — ABNORMAL LOW (ref 135–145)
Total Bilirubin: 1.4 mg/dL — ABNORMAL HIGH (ref 0.3–1.2)
Total Protein: 7 g/dL (ref 6.5–8.1)

## 2017-06-01 LAB — CBC WITH DIFFERENTIAL/PLATELET
Basophils Absolute: 0 10*3/uL (ref 0–0.1)
Basophils Relative: 0 %
Eosinophils Absolute: 0.1 10*3/uL (ref 0–0.7)
Eosinophils Relative: 0 %
HEMATOCRIT: 40 % (ref 35.0–47.0)
Hemoglobin: 13.3 g/dL (ref 12.0–16.0)
LYMPHS ABS: 1 10*3/uL (ref 1.0–3.6)
LYMPHS PCT: 5 %
MCH: 31.1 pg (ref 26.0–34.0)
MCHC: 33.3 g/dL (ref 32.0–36.0)
MCV: 93.3 fL (ref 80.0–100.0)
MONO ABS: 1.3 10*3/uL — AB (ref 0.2–0.9)
MONOS PCT: 7 %
NEUTROS ABS: 16.5 10*3/uL — AB (ref 1.4–6.5)
Neutrophils Relative %: 88 %
Platelets: 234 10*3/uL (ref 150–440)
RBC: 4.29 MIL/uL (ref 3.80–5.20)
RDW: 13.2 % (ref 11.5–14.5)
WBC: 18.9 10*3/uL — ABNORMAL HIGH (ref 3.6–11.0)

## 2017-06-01 LAB — LACTIC ACID, PLASMA
Lactic Acid, Venous: 3.5 mmol/L (ref 0.5–1.9)
Lactic Acid, Venous: 1.4 mmol/L (ref 0.5–1.9)

## 2017-06-01 LAB — GLUCOSE, CAPILLARY: Glucose-Capillary: 164 mg/dL — ABNORMAL HIGH (ref 65–99)

## 2017-06-01 LAB — TROPONIN I
TROPONIN I: 0.14 ng/mL — AB (ref <0.03)
Troponin I: 0.26 ng/mL (ref <0.03)

## 2017-06-01 LAB — PROTIME-INR
INR: 1.11
Prothrombin Time: 14.2 seconds (ref 11.4–15.2)

## 2017-06-01 LAB — APTT: APTT: 40 s — AB (ref 24–36)

## 2017-06-01 LAB — BRAIN NATRIURETIC PEPTIDE: B Natriuretic Peptide: 1128 pg/mL — ABNORMAL HIGH (ref 0.0–100.0)

## 2017-06-01 MED ORDER — FUROSEMIDE 10 MG/ML IJ SOLN
40.0000 mg | Freq: Two times a day (BID) | INTRAMUSCULAR | Status: DC
  Administered 2017-06-02 – 2017-06-04 (×5): 40 mg via INTRAVENOUS
  Filled 2017-06-01 (×5): qty 4

## 2017-06-01 MED ORDER — HEPARIN (PORCINE) IN NACL 100-0.45 UNIT/ML-% IJ SOLN
12.0000 [IU]/kg/h | INTRAMUSCULAR | Status: DC
Start: 2017-06-01 — End: 2017-06-02
  Administered 2017-06-01: 12 [IU]/kg/h via INTRAVENOUS
  Filled 2017-06-01: qty 250

## 2017-06-01 MED ORDER — ASPIRIN 81 MG PO CHEW
324.0000 mg | CHEWABLE_TABLET | Freq: Once | ORAL | Status: AC
  Administered 2017-06-01: 324 mg via ORAL
  Filled 2017-06-01: qty 4

## 2017-06-01 MED ORDER — SERTRALINE HCL 50 MG PO TABS
50.0000 mg | ORAL_TABLET | Freq: Every day | ORAL | Status: DC
  Administered 2017-06-02 – 2017-06-04 (×3): 50 mg via ORAL
  Filled 2017-06-01 (×3): qty 1

## 2017-06-01 MED ORDER — POTASSIUM CHLORIDE ER 10 MEQ PO TBCR
10.0000 meq | EXTENDED_RELEASE_TABLET | Freq: Every day | ORAL | Status: DC
  Administered 2017-06-02 – 2017-06-04 (×3): 10 meq via ORAL
  Filled 2017-06-01 (×5): qty 1

## 2017-06-01 MED ORDER — DEXTROSE 5 % IV SOLN
2.0000 g | Freq: Once | INTRAVENOUS | Status: DC
  Filled 2017-06-01: qty 2

## 2017-06-01 MED ORDER — ASPIRIN EC 81 MG PO TBEC
81.0000 mg | DELAYED_RELEASE_TABLET | Freq: Every day | ORAL | Status: DC
  Administered 2017-06-02 – 2017-06-04 (×3): 81 mg via ORAL
  Filled 2017-06-01 (×3): qty 1

## 2017-06-01 MED ORDER — SODIUM CHLORIDE 0.9 % IV BOLUS (SEPSIS)
1000.0000 mL | Freq: Once | INTRAVENOUS | Status: AC
  Administered 2017-06-01: 1000 mL via INTRAVENOUS

## 2017-06-01 MED ORDER — ONDANSETRON HCL 4 MG/2ML IJ SOLN: 4.0000 mg | Freq: Four times a day (QID) | INTRAMUSCULAR | Status: DC | PRN

## 2017-06-01 MED ORDER — ATORVASTATIN CALCIUM 20 MG PO TABS
80.0000 mg | ORAL_TABLET | Freq: Every day | ORAL | Status: DC
  Administered 2017-06-02 – 2017-06-03 (×2): 80 mg via ORAL
  Filled 2017-06-01 (×2): qty 4

## 2017-06-01 MED ORDER — RAMIPRIL 10 MG PO CAPS
20.0000 mg | ORAL_CAPSULE | Freq: Every day | ORAL | Status: DC
Start: 2017-06-02 — End: 2017-06-04
  Administered 2017-06-02 – 2017-06-04 (×3): 20 mg via ORAL
  Filled 2017-06-01 (×3): qty 2

## 2017-06-01 MED ORDER — IOPAMIDOL (ISOVUE-370) INJECTION 76%
60.0000 mL | Freq: Once | INTRAVENOUS | Status: AC | PRN
  Administered 2017-06-01: 60 mL via INTRAVENOUS

## 2017-06-01 MED ORDER — ACETAMINOPHEN 325 MG PO TABS
650.0000 mg | ORAL_TABLET | ORAL | Status: DC | PRN
  Administered 2017-06-03: 650 mg via ORAL
  Filled 2017-06-01: qty 2

## 2017-06-01 MED ORDER — SODIUM CHLORIDE 0.9 % IV BOLUS (SEPSIS): 1000.0000 mL | Freq: Once | INTRAVENOUS | Status: DC

## 2017-06-01 MED ORDER — ASPIRIN 81 MG PO CHEW: 324.0000 mg | CHEWABLE_TABLET | ORAL | Status: AC

## 2017-06-01 MED ORDER — IOPAMIDOL (ISOVUE-370) INJECTION 76%: 75.0000 mL | Freq: Once | INTRAVENOUS | Status: DC | PRN

## 2017-06-01 MED ORDER — ASPIRIN 300 MG RE SUPP: 300.0000 mg | RECTAL | Status: AC

## 2017-06-01 MED ORDER — INSULIN ASPART 100 UNIT/ML ~~LOC~~ SOLN
0.0000 [IU] | Freq: Three times a day (TID) | SUBCUTANEOUS | Status: DC
  Administered 2017-06-04: 2 [IU] via SUBCUTANEOUS
  Filled 2017-06-01: qty 1

## 2017-06-01 MED ORDER — LEVOTHYROXINE SODIUM 25 MCG PO TABS
25.0000 ug | ORAL_TABLET | Freq: Every day | ORAL | Status: DC
  Administered 2017-06-02 – 2017-06-04 (×3): 25 ug via ORAL
  Filled 2017-06-01 (×3): qty 1

## 2017-06-01 MED ORDER — MECLIZINE HCL 12.5 MG PO TABS
12.5000 mg | ORAL_TABLET | Freq: Two times a day (BID) | ORAL | Status: DC
  Administered 2017-06-02 – 2017-06-04 (×5): 12.5 mg via ORAL
  Filled 2017-06-01 (×7): qty 1

## 2017-06-01 MED ORDER — ASPIRIN EC 81 MG PO TBEC: 81.0000 mg | DELAYED_RELEASE_TABLET | Freq: Every day | ORAL | Status: DC

## 2017-06-01 MED ORDER — MORPHINE SULFATE (PF) 2 MG/ML IV SOLN: 2.0000 mg | INTRAVENOUS | Status: DC | PRN

## 2017-06-01 MED ORDER — HEPARIN SODIUM (PORCINE) 5000 UNIT/ML IJ SOLN
60.0000 [IU]/kg | Freq: Once | INTRAMUSCULAR | Status: AC
  Administered 2017-06-01: 3550 [IU] via INTRAVENOUS

## 2017-06-01 MED ORDER — CARVEDILOL 12.5 MG PO TABS
12.5000 mg | ORAL_TABLET | Freq: Two times a day (BID) | ORAL | Status: DC
  Administered 2017-06-02 – 2017-06-04 (×5): 12.5 mg via ORAL
  Filled 2017-06-01 (×5): qty 1

## 2017-06-01 MED ORDER — SODIUM CHLORIDE 0.9 % IV SOLN
500.0000 mg | Freq: Two times a day (BID) | INTRAVENOUS | Status: DC
  Administered 2017-06-01: 500 mg via INTRAVENOUS
  Filled 2017-06-01 (×3): qty 0.5

## 2017-06-01 MED ORDER — PANTOPRAZOLE SODIUM 40 MG PO TBEC
40.0000 mg | DELAYED_RELEASE_TABLET | Freq: Every day | ORAL | Status: DC
  Administered 2017-06-02 – 2017-06-04 (×3): 40 mg via ORAL
  Filled 2017-06-01 (×3): qty 1

## 2017-06-01 MED ORDER — NITROGLYCERIN 2 % TD OINT
0.5000 [in_us] | TOPICAL_OINTMENT | Freq: Three times a day (TID) | TRANSDERMAL | Status: DC
  Filled 2017-06-01: qty 1

## 2017-06-01 MED ORDER — FENOFIBRATE 160 MG PO TABS
160.0000 mg | ORAL_TABLET | Freq: Every day | ORAL | Status: DC
Start: 2017-06-02 — End: 2017-06-04
  Administered 2017-06-02 – 2017-06-04 (×3): 160 mg via ORAL
  Filled 2017-06-01 (×3): qty 1

## 2017-06-01 MED ORDER — NITROGLYCERIN 0.4 MG SL SUBL: 0.4000 mg | SUBLINGUAL_TABLET | SUBLINGUAL | Status: DC | PRN

## 2017-06-01 MED ORDER — LEVOFLOXACIN IN D5W 750 MG/150ML IV SOLN
750.0000 mg | Freq: Once | INTRAVENOUS | Status: AC
Start: 2017-06-01 — End: 2017-06-01
  Administered 2017-06-01: 750 mg via INTRAVENOUS
  Filled 2017-06-01: qty 150

## 2017-06-01 NOTE — H&P (Signed)
[facility] [place] NAME: Jacqueline [name]    MR#:  [id number]  DATE OF BIRTH:  02/08/1933  DATE OF ADMISSION:  06/01/2017  PRIMARY CARE PHYSICIAN: Tracie Harrier, MD   REQUESTING/REFERRING PHYSICIAN:   CHIEF COMPLAINT:   Chief Complaint  Patient presents with  . Respiratory Distress    HISTORY OF PRESENT ILLNESS: Jacqueline [name]  is a 83 y.o. female with a known history per below dilated cardiomyopathy with ejection fraction 30% from 2013 records, foreign born Asian female, speaks some English, patient is not a good historian with noted memory deficits, daughter at bedside is a good historian, presenting with UTI symptomatology starting 2 days ago, had acute on chronic shortness of breath today which was worsening, general ill feeling, nonproductive cough, patient with high O2 requirement in route to the emergency room via EMS, in the emergency room patient noted to have UA consistent with UTI, white count of 18,000, chest x-ray noted for congestive heart failure, troponin of 0.14, BNP greater than 1100, EKG concerning for ST segment elevation as well as V3, persistent ST segment elevation in V2 on repeat, in discussion with ER attending-cardiology not contacted given concern for sepsis, patient was started on heparin drip, patient evaluated in the emergency room, daughter at bedside, patient currently without chest pain, complains of headache, ill feeling, dysuria, patient is now being admitted for acute MI/cannot rule out acute anterior MI for which cardiology is on page, case discussed with intensivist given need for ICU care, acute on chronic congestive heart failure exacerbation noted history of dilated cardiomyopathy, and acute sepsis secondary to urinary tract infection.  PAST MEDICAL HISTORY:   Past Medical History:  Diagnosis Date  . CHF (congestive heart failure) ([place])   . Diabetes mellitus without complication ([place]) 40/98/1191   Currently no  high BS.  Has come off meds.  But having episodes of low BS now.  . Dilated idiopathic cardiomyopathy ([place])   . GERD (gastroesophageal reflux disease)   . Hyperlipidemia   . Hypertension   . Hypothyroidism   . Renal disorder     PAST SURGICAL HISTORY:  Past Surgical History:  Procedure Laterality Date  . BUNIONECTOMY    . COLONOSCOPY    . TONSILLECTOMY      SOCIAL HISTORY:  Social History   Tobacco Use  . Smoking status: Former Research scientist (life sciences)  . Smokeless tobacco: Never Used  Substance Use Topics  . Alcohol use: No    FAMILY HISTORY:  Family History  Problem Relation Age of Onset  . Premature CHD Brother     DRUG ALLERGIES:  Allergies  Allergen Reactions  . Ciprofloxacin   . Digoxin And Related   . Erythromycin   . Lopid [Gemfibrozil]   . Nitrofurantoin   . Cefuroxime Rash  . Penicillins Rash    Has patient had a PCN reaction causing immediate rash, facial/tongue/throat swelling, SOB or lightheadedness with hypotension: Unknown Has patient had a PCN reaction causing severe rash involving mucus membranes or skin necrosis: No Has patient had a PCN reaction that required hospitalization: No Has patient had a PCN reaction occurring within the last 10 years: No If all of the above answers are "NO", then may proceed with Cephalosporin use.     REVIEW OF SYSTEMS:  Poor historian given memory deficits CONSTITUTIONAL: ? fever, +fatigue/weakness.  EYES: No blurred or double vision.  EARS, NOSE, AND THROAT: No tinnitus or ear pain.  RESPIRATORY: nonproductive cough/shortness of breath, no wheezing  or hemoptysis.  CARDIOVASCULAR: No chest pain, + orthopnea, edema.  GASTROINTESTINAL: No nausea, vomiting, diarrhea or abdominal pain.  GENITOURINARY: + dysuria, no hematuria.  ENDOCRINE: No polyuria, nocturia,  HEMATOLOGY: No anemia, easy bruising or bleeding SKIN: No rash or lesion. MUSCULOSKELETAL: No joint pain or arthritis.   NEUROLOGIC: No tingling, numbness, weakness.   Chronic memory deficits PSYCHIATRY: No anxiety or depression.   MEDICATIONS AT HOME:  Prior to Admission medications   Medication Sig Start Date End Date Taking? Authorizing Provider  aspirin EC 81 MG tablet Take 81 mg by mouth daily.   Yes [provider]  carvedilol (COREG) 12.5 MG tablet Take 12.5 mg by mouth 2 (two) times daily with a meal.   Yes [provider]  ergocalciferol (VITAMIN D2) 50000 units capsule Take 50,000 Units by mouth once a week.   Yes [provider]  fenofibrate (TRICOR) 145 MG tablet Take 145 mg by mouth daily.   Yes [provider]  furosemide (LASIX) 40 MG tablet Take 20-40 mg by mouth 2 (two) times daily. Take 40 mg in the morning and 20 mg in the evening.   Yes [provider]  Iron-FA-B Cmp-C-Biot-Probiotic (FUSION PLUS) CAPS Take 1 capsule by mouth daily.    Yes [provider]  levothyroxine (SYNTHROID, LEVOTHROID) 25 MCG tablet Take 25 mcg by mouth daily before breakfast.   Yes [provider]  meclizine (ANTIVERT) 25 MG tablet Take 12.5 mg by mouth 2 (two) times daily.   Yes [provider]  pantoprazole (PROTONIX) 40 MG tablet Take 40 mg by mouth daily.   Yes [provider]  potassium chloride (K-DUR) 10 MEQ tablet Take 10 mEq by mouth daily.   Yes [provider]  ramipril (ALTACE) 10 MG capsule Take 20 mg by mouth daily.    Yes [provider]  sertraline (ZOLOFT) 50 MG tablet Take 50 mg by mouth daily.   Yes [provider]  glucose blood (FREESTYLE LITE) test strip USE TO CHECK BLOOD SUGARS TWICE A DAY 02/19/16   [provider]      PHYSICAL EXAMINATION:   VITAL SIGNS: Blood pressure (!) 102/46, pulse 79, temperature 99 F (37.2 C), temperature source Oral, resp. rate 18, weight 59 kg (130 lb), SpO2 98 %.  GENERAL:  83 y.o.-year-old patient lying in the bed with mild acute distress.  Frail-appearing, diaphoretic EYES: Pupils equal,  round, reactive to light and accommodation. No scleral icterus. Extraocular muscles intact.  HEENT: Head atraumatic, normocephalic. Oropharynx and nasopharynx clear.  NECK:  Supple, no jugular venous distention. No thyroid enlargement, no tenderness.  LUNGS: Rales on auscultation of the lungs bilaterally at bases. No use of accessory muscles of respiration.  CARDIOVASCULAR: S1, S2 normal. No murmurs, rubs, or gallops.  ABDOMEN: Soft, suprapubic tenderness, nondistended. Bowel sounds present. No organomegaly or mass.  EXTREMITIES: No pedal edema, cyanosis, or clubbing.  NEUROLOGIC: Cranial nerves II through XII are intact. Muscle strength 5/5 in all extremities. Sensation intact. Gait not checked.  PSYCHIATRIC: The patient is alert and oriented x 2-3, memory deficits noted, confusion noted.  SKIN: No obvious rash, lesion, or ulcer.   LABORATORY PANEL:   CBC Recent Labs  Lab 06/01/17 1739  WBC 18.9*  HGB 13.3  HCT 40.0  PLT 234  MCV 93.3  MCH 31.1  MCHC 33.3  RDW 13.2  LYMPHSABS 1.0  MONOABS 1.3*  EOSABS 0.1  BASOSABS 0.0   ------------------------------------------------------------------------------------------------------------------  Chemistries  Recent Labs  Lab 06/01/17  1924  NA 130*  K 4.8  CL 97*  CO2 25  GLUCOSE 260*  BUN 51*  CREATININE 1.48*  CALCIUM 8.7*  AST 33  ALT 13*  ALKPHOS 48  BILITOT 1.4*   ------------------------------------------------------------------------------------------------------------------ estimated creatinine clearance is 24.2 mL/min (A) (by C-G formula based on SCr of 1.48 mg/dL (H)). ------------------------------------------------------------------------------------------------------------------ No results for input(s): TSH, T4TOTAL, T3FREE, THYROIDAB in the last 72 hours.  Invalid input(s): FREET3   Coagulation profile Recent Labs  Lab 06/01/17 1739  INR 1.11    ------------------------------------------------------------------------------------------------------------------- No results for input(s): DDIMER in the last 72 hours. -------------------------------------------------------------------------------------------------------------------  Cardiac Enzymes Recent Labs  Lab 06/01/17 1924  TROPONINI 0.14*   ------------------------------------------------------------------------------------------------------------------ Invalid input(s): POCBNP  ---------------------------------------------------------------------------------------------------------------  Urinalysis    Component Value Date/Time   COLORURINE AMBER (A) 06/01/2017 1739   APPEARANCEUR TURBID (A) 06/01/2017 1739   APPEARANCEUR Clear 06/13/2014 2201   LABSPEC 1.009 06/01/2017 1739   LABSPEC 1.008 06/13/2014 2201   PHURINE 5.0 06/01/2017 1739   GLUCOSEU NEGATIVE 06/01/2017 1739   GLUCOSEU 150 mg/dL 06/13/2014 2201   HGBUR MODERATE (A) 06/01/2017 1739   BILIRUBINUR NEGATIVE 06/01/2017 1739   BILIRUBINUR Negative 06/13/2014 2201   KETONESUR NEGATIVE 06/01/2017 1739   PROTEINUR 100 (A) 06/01/2017 1739   NITRITE POSITIVE (A) 06/01/2017 1739   LEUKOCYTESUR LARGE (A) 06/01/2017 1739   LEUKOCYTESUR Trace 06/13/2014 2201     RADIOLOGY: Ct Angio Chest Pe W And/or Wo Contrast  Result Date: 06/01/2017 CLINICAL DATA:  Short of breath EXAM: CT ANGIOGRAPHY CHEST WITH CONTRAST TECHNIQUE: Multidetector CT imaging of the chest was performed using the standard protocol during bolus administration of intravenous contrast. Multiplanar CT image reconstructions and MIPs were obtained to evaluate the vascular anatomy. CONTRAST:  79mL ISOVUE-370 IOPAMIDOL (ISOVUE-370) INJECTION 76% COMPARISON:  Chest 06/01/2017 FINDINGS: Cardiovascular: Negative for pulmonary embolism. Pulmonary artery enlargement bilaterally compatible with pulmonary artery hypertension Extensive atherosclerotic  calcification aortic arch and coronary artery. Heart size within normal limits. No pericardial effusion Mediastinum/Nodes: Negative for mass or adenopathy Lungs/Pleura: Scarring right medial upper lobe. Chronic lung disease. Negative for infiltrate or effusion. Partially calcified nodule right lung base unchanged from prior PET-CT 03/13/2017 Upper Abdomen: Cirrhosis of the liver. No ascites. Spleen not enlarged Musculoskeletal: Negative Review of the MIP images confirms the above findings. IMPRESSION: Negative for pulmonary embolism.  Pulmonary artery hypertension Diffuse atherosclerotic disease including the coronary artery. Cirrhosis Aortic Atherosclerosis (ICD10-I70.0). Electronically Signed   By: Franchot Gallo M.D.   On: 06/01/2017 21:02   Dg Chest Port 1 View  Result Date: 06/01/2017 CLINICAL DATA:  Short of breath EXAM: PORTABLE CHEST 1 VIEW COMPARISON:  03/02/2013 FINDINGS: Cardiac enlargement with mild vascular congestion. Negative for edema or effusion. Mild bibasilar atelectasis. Sclerotic benign appearing lesion proximal left humerus unchanged IMPRESSION: Mild bibasilar atelectasis. Pulmonary vascular congestion Atherosclerotic aorta Electronically Signed   By: Franchot Gallo M.D.   On: 06/01/2017 18:39    EKG: Orders placed or performed during the hospital encounter of 06/01/17  . ED EKG 12-Lead  . ED EKG 12-Lead  . EKG 12-Lead  . EKG 12-Lead  . ED EKG  . ED EKG  . EKG 12-Lead  . EKG 12-Lead  . EKG 12-Lead  . EKG 12-Lead    IMPRESSION AND PLAN: 1 acute probable MI Cannot rule out STEMI, compounded by sepsis secondary to UTI Admit to ICU on our ACS protocol, intensivist notified, cardiology on page, aspirin, Coreg, ramipril, heparin drip, supplemental oxygen as needed, schedule Nitropaste to chest every 8  hours, morphine as needed breakthrough pain, statin therapy-check lipids in the morning, continue to cycle cardiac enzymes, check echocardiogram, and continue close medical  monitoring  2 acute sepsis secondary to UTI Admit on our sepsis protocol, empiric Rocephin, IV fluids for rehydration, follow-up on cultures  3 acute urinary tract infection Plan of care as stated above  4 Acute on chronic systolic congestive heart failure exacerbation with history of dilated cardiomyopathy with ejection fraction 30% on studies from 2013 Exacerbated by above IV Lasix twice daily, aspirin, Coreg, lisinopril, strict I&O monitoring, daily weights, continue close medical monitoring  5 chronic diabetes mellitus type 2 sliding scale insulin and Accu-Cheks per routine  DNR per power of attorney/daughter Condition guarded Prognosis poor DVT prophylaxis-on heparin drip Disposition pending clinical course     All the records are reviewed and case discussed with ED provider. Management plans discussed with the patient, family and they are in agreement.  CODE STATUS: Code Status History    Date Active Date Inactive Code Status Order ID Comments User Context   02/25/2017 00:00 02/26/2017 02:45 Full Code [id number]  Lance Coon, MD ED   02/18/2017 16:20 02/20/2017 18:54 Full Code [id number]  Hillary Bow, MD ED       TOTAL TIME TAKING CARE OF THIS PATIENT: 55 minutes.    Avel Peace Salary M.D on 06/01/2017   Between 7am to 6pm - Pager - [phone]  After 6pm go to [email or web address] - password [email or web address] [facility]  Office  [phone]  CC: Primary care physician; Tracie Harrier, MD   Note: This dictation was prepared with Dragon dictation along with smaller phrase technology. Any transcriptional errors that result from this process are unintentional.

## 2017-06-01 NOTE — Progress Notes (Signed)
[facility] Progress Note Patient Name: Jacqueline [name] DOB: 1933/05/10 MRN: [id number]   Date of Service  06/01/2017  HPI/Events of Note  83 yo female with PHM of dilated CM (LVEF = 30% 5 years ago), DM. HRN, GERD. Present now with Acute on chronic SOB. EKG with Ant ST elevation in V2 and V3. Troponin = 0.15. Cardiology consulted and does not feel that clinical picture c/w STEMI and will see patient in the AM. Also has recurrent UTI and possible sepsis. PCCM asked to assume care in ICU. VSS.   eICU Interventions  No new orders.      Intervention Category Evaluation Type: New Patient Evaluation  Lysle Dingwall 06/01/2017, 11:36 PM

## 2017-06-01 NOTE — Progress Notes (Signed)
Case discussed with cardiology, EKGs were reviewed-does not feel that the patient has ST segment elevation MI, more likely congestive heart failure exacerbation and sepsis, recommended treatment as indicated, cardiology to see in the morning

## 2017-06-01 NOTE — Progress Notes (Signed)
ANTICOAGULATION CONSULT NOTE - Initial Consult  Pharmacy Consult for heparin Indication: chest pain/ACS  Allergies  Allergen Reactions  . Ciprofloxacin   . Digoxin And Related   . Erythromycin   . Lopid [Gemfibrozil]   . Nitrofurantoin   . Cefuroxime Rash  . Penicillins Rash    Has patient had a PCN reaction causing immediate rash, facial/tongue/throat swelling, SOB or lightheadedness with hypotension: Unknown Has patient had a PCN reaction causing severe rash involving mucus membranes or skin necrosis: No Has patient had a PCN reaction that required hospitalization: No Has patient had a PCN reaction occurring within the last 10 years: No If all of the above answers are "NO", then may proceed with Cephalosporin use.     Patient Measurements: Weight: 130 lb (59 kg) Heparin Dosing Weight: 59 kg  Vital Signs: Temp: 98.2 F (36.8 C) (12/17 2200) Temp Source: Oral (12/17 1743) BP: 126/52 (12/17 2204) Pulse Rate: 78 (12/17 2204)  Labs: Recent Labs    06/01/17 1739 06/01/17 1924 06/01/17 2219  HGB 13.3  --   --   HCT 40.0  --   --   PLT 234  --   --   APTT 40*  --   --   LABPROT 14.2  --   --   INR 1.11  --   --   CREATININE  --  1.48*  --   TROPONINI  --  0.14* 0.26*    Estimated Creatinine Clearance: 24.2 mL/min (A) (by C-G formula based on SCr of 1.48 mg/dL (H)).   Medical History: Past Medical History:  Diagnosis Date  . CHF (congestive heart failure) ([place])   . Diabetes mellitus without complication ([place]) 44/31/5400   Currently no high BS.  Has come off meds.  But having episodes of low BS now.  . Dilated idiopathic cardiomyopathy ([place])   . GERD (gastroesophageal reflux disease)   . Hyperlipidemia   . Hypertension   . Hypothyroidism   . Renal disorder     Medications:  Scheduled:  . aspirin  324 mg Oral NOW   Or  . aspirin  300 mg Rectal NOW  . [START ON 06/02/2017] aspirin EC  81 mg Oral Daily  . [START ON 06/02/2017] atorvastatin  80 mg Oral  q1800  . [START ON 06/02/2017] carvedilol  12.5 mg Oral BID WC  . [START ON 06/02/2017] fenofibrate  160 mg Oral Daily  . [START ON 06/02/2017] furosemide  40 mg Intravenous BID  . [START ON 06/02/2017] insulin aspart  0-15 Units Subcutaneous TID WC  . [START ON 06/02/2017] levothyroxine  25 mcg Oral QAC breakfast  . meclizine  12.5 mg Oral BID  . nitroGLYCERIN  0.5 inch Topical Q8H  . [START ON 06/02/2017] pantoprazole  40 mg Oral Daily  . [START ON 06/02/2017] potassium chloride  10 mEq Oral Daily  . [START ON 06/02/2017] ramipril  20 mg Oral Daily  . [START ON 06/02/2017] sertraline  50 mg Oral Daily    Assessment: Patient admitted for SOB and possible sepsis s/t to UTI and/or PNA.  Patient has congestion on CXR w/ a BNP 1100's, trop noted to be 0.14 >> 0.26 w/ possible ST elevation on EKG. Patient was started on heparin drip in the ED  Goal of Therapy:  Heparin level 0.3-0.7 units/ml Monitor platelets by anticoagulation protocol: Yes   Plan:  Patient's heparin drip was dosed in the ED as follows: Bolused heparin 3550 units IV x 1 (60 units/kg bolus) Drip was started  at 700 units/hr (12 units/kg/hr) Baseline labs ordered -- baseline aPTT slightly elevated Will order heparin level for 0500 @ 12/8. Will monitor daily CBC's and will adjust per heparin levels.  Tobie Lords, PharmD, BCPS Clinical Pharmacist 06/02/2017

## 2017-06-01 NOTE — ED Provider Notes (Addendum)
[facility] Emergency Department Provider Note  ____________________________________________  Time seen: Approximately 5:41 PM  I have reviewed the triage vital signs and the nursing notes.   HISTORY  Chief Complaint Respiratory Distress The patient's history is limited because she is nonverbal in the emergency department   HPI Jacqueline [name] is a 83 y.o. female with a history of HTN, HL, CHF and dilated idiopathic cardiomyopathy, DM, brought by EMS for respiratory distress.  Initially upon arrival, the patient had O2 sats of 98% on 6 L nasal cannula for EMS.  In route, the patient was placed on CPAP and began to cough up pink frothy sputum, with O2 sats dropping into the 70s.  Per EMS, she was answering questions verbally.  On arrival to the emergency department, the patient is alert but only nods yes or no to answer questions and does not answer all questions.  Her blood sugar is normal.  Her O2 sats are 100% on room air but she does have some significant peripheral vasoconstriction.  She is tachypneic.  Per report, the patient has had a UTI but has not been under treatment.  Past Medical History:  Diagnosis Date  . CHF (congestive heart failure) ([place])   . Diabetes mellitus without complication ([place]) 07/62/2633   Currently no high BS.  Has come off meds.  But having episodes of low BS now.  . Dilated idiopathic cardiomyopathy ([place])   . GERD (gastroesophageal reflux disease)   . Hyperlipidemia   . Hypertension   . Hypothyroidism   . Renal disorder     Patient Active Problem List   Diagnosis Date Noted  . Acute upper back pain 03/05/2017  . Carcinoma of unknown primary ([place]) 03/05/2017  . Liver lesion 02/27/2017  . Diabetes ([place]) 02/24/2017  . Chronic systolic CHF (congestive heart failure) ([place]) 02/24/2017  . HTN (hypertension) 02/24/2017  . GERD (gastroesophageal reflux disease) 02/24/2017  . Hypothyroidism 02/24/2017  . Hypoglycemia 02/24/2017  .  Dizziness 02/18/2017    Past Surgical History:  Procedure Laterality Date  . BUNIONECTOMY    . COLONOSCOPY    . TONSILLECTOMY      Current Outpatient Rx  . Order #: [id number] Class: Historical Med  . Order #: [id number] Class: Historical Med  . Order #: [id number] Class: Historical Med  . Order #: [id number] Class: Historical Med  . Order #: [id number] Class: Historical Med  . Order #: [id number] Class: Historical Med  . Order #: [id number] Class: Historical Med  . Order #: [id number] Class: Historical Med  . Order #: [id number] Class: Historical Med  . Order #: [id number] Class: Historical Med  . Order #: [id number] Class: Historical Med  . Order #: [id number] Class: Historical Med  . Order #: [id number] Class: Historical Med    Allergies Ciprofloxacin; Digoxin and related; Erythromycin; Lopid [gemfibrozil]; Nitrofurantoin; Cefuroxime; and Penicillins  Family History  Problem Relation Age of Onset  . Premature CHD Brother     Social History Social History   Tobacco Use  . Smoking status: Former Research scientist (life sciences)  . Smokeless tobacco: Never Used  Substance Use Topics  . Alcohol use: No  . Drug use: No    Review of Systems Unable to obtain due to patient being nonverbal.   ____________________________________________   PHYSICAL EXAM:  VITAL SIGNS: ED Triage Vitals [06/01/17 1739]  Enc Vitals Group     BP      Pulse      Resp      Temp      Temp src      SpO2  Weight 130 lb (59 kg)     Height      Head Circumference      Peak Flow      Pain Score      Pain Loc      Pain Edu?      Excl. in [place]?     Constitutional: The patient is alert, makes eye contact, and answers some yes or no questions but not does not generally verbally respond.  She is able to follow some basic commands.  She is diaphoretic and uncomfortable appearing. Eyes: Conjunctivae are normal.  EOMI. No scleral icterus. Head: Atraumatic. Nose: No congestion/rhinnorhea. Mouth/Throat: Mucous membranes are moist.   She has some frothy pink sputum at the lips. Neck: No stridor.  Supple.  Positive JVD. Cardiovascular: Normal rate, regular rhythm. No murmurs, rubs or gallops.  Respiratory: The patient is tachypneic with accessory muscle use and retractions.  Her O2 sats are 100% on room air in the emergency department.  She has no wheezes, rales or rhonchi on my exam Gastrointestinal: Soft, nontender and nondistended.  No guarding or rebound.  No peritoneal signs. Musculoskeletal: No LE edema. No ttp in the calves or palpable cords.  Negative Homan's sign. Neurologic: alert and nonverbal.  Protecting her airway.  Face and smile are symmetric.  EOMI.  Moves all extremities well. Skin:  Skin is warm, dry and intact. No rash noted. Psychiatric: Unable to assess.  ____________________________________________   LABS (all labs ordered are listed, but only abnormal results are displayed)  Labs Reviewed  CBC WITH DIFFERENTIAL/PLATELET - Abnormal; Notable for the following components:      Result Value   WBC 18.9 (*)    Neutro Abs 16.5 (*)    Monocytes Absolute 1.3 (*)    All other components within normal limits  LACTIC ACID, PLASMA - Abnormal; Notable for the following components:   Lactic Acid, Venous 3.5 (*)    All other components within normal limits  BLOOD GAS, VENOUS - Abnormal; Notable for the following components:   Acid-base deficit 2.8 (*)    All other components within normal limits  APTT - Abnormal; Notable for the following components:   aPTT 40 (*)    All other components within normal limits  URINALYSIS, COMPLETE (UACMP) WITH MICROSCOPIC - Abnormal; Notable for the following components:   Color, Urine AMBER (*)    APPearance TURBID (*)    Hgb urine dipstick MODERATE (*)    Protein, ur 100 (*)    Nitrite POSITIVE (*)    Leukocytes, UA LARGE (*)    Bacteria, UA FEW (*)    All other components within normal limits  GLUCOSE, CAPILLARY - Abnormal; Notable for the following components:    Glucose-Capillary 164 (*)    All other components within normal limits  BRAIN NATRIURETIC PEPTIDE - Abnormal; Notable for the following components:   B Natriuretic Peptide 1,128.0 (*)    All other components within normal limits  COMPREHENSIVE METABOLIC PANEL - Abnormal; Notable for the following components:   Sodium 130 (*)    Chloride 97 (*)    Glucose, Bld 260 (*)    BUN 51 (*)    Creatinine, Ser 1.48 (*)    Calcium 8.7 (*)    ALT 13 (*)    Total Bilirubin 1.4 (*)    GFR calc non Af Amer 32 (*)    GFR calc Af Amer 37 (*)    All other components within normal limits  TROPONIN I -  Abnormal; Notable for the following components:   Troponin I 0.14 (*)    All other components within normal limits  CULTURE, BLOOD (ROUTINE X 2)  CULTURE, BLOOD (ROUTINE X 2)  URINE CULTURE  LACTIC ACID, PLASMA  PROTIME-INR   ____________________________________________  EKG  ED ECG REPORT I, Eula Listen, the attending physician, personally viewed and interpreted this ECG.   Date: 06/01/2017  EKG Time: 1749  Rate: 95  Rhythm: normal sinus rhythm  Axis: leftward  Intervals:first-degree A-V block   ST&T Change: 2 mm of ST elevation in V2, with 1 mm of ST elevation in V1 and V3.  0.5 mm of ST depression seen in V6.  The patient does not meet criteria for STEMI, but I am concerned about ischemic changes.  Patient does have some minimal elevation in her prior EKG from 03-07-2023, but it is more pronounced today.  This may be demand ischemia from this increased rate but a repeat EKG will be required.  ED ECG REPORT I, Eula Listen, the attending physician, personally viewed and interpreted this ECG.   Date: 06/01/2017  EKG Time: 2015  Rate: 81  Rhythm: normal sinus rhythm  Axis: normal  Intervals:first-degree A-V block   ST&T Change: The patient continues to have 1 mm of ST elevation in V1 and V2 with 0.5 mm of ST depression in V5; overall, these ischemic changes are significantly  improved compared to her first EKG.  No STEMI.   ____________________________________________  RADIOLOGY  Dg Chest Port 1 View  Result Date: 06/01/2017 CLINICAL DATA:  Short of breath EXAM: PORTABLE CHEST 1 VIEW COMPARISON:  03/02/2013 FINDINGS: Cardiac enlargement with mild vascular congestion. Negative for edema or effusion. Mild bibasilar atelectasis. Sclerotic benign appearing lesion proximal left humerus unchanged IMPRESSION: Mild bibasilar atelectasis. Pulmonary vascular congestion Atherosclerotic aorta Electronically Signed   By: Franchot Gallo M.D.   On: 06/01/2017 18:39    ____________________________________________   PROCEDURES  Procedure(s) performed: None  Procedures  Critical Care performed: Yes, see critical care note(s) ____________________________________________   INITIAL IMPRESSION / ASSESSMENT AND PLAN / ED COURSE  Pertinent labs & imaging results that were available during my care of the patient were reviewed by me and considered in my medical decision making (see chart for details).  83 y.o. female with a history of CHF and cardiomyopathy, HTN, HL and DM presenting with respiratory distress, hypoxia, and decreasing responsiveness.  Overall, I am concerned about the patient's respiratory status, but there does not appear to be any focal abnormalities on her pulmonary examination and her O2 sats are 100%.  It is possible that her tachypnea is actually an increased respiratory drive due to metabolic acidosis.  We will get a VBG for further evaluation.  At this time, the patient was removed from the EMS CPAP and not placed on BiPAP.  A infectious workup has been initiated, including both blood cultures and urinalysis.  ACS or MI is also possible and I am awaiting her.  The patient will require admission to the hospital for further evaluation and treatment.  I reviewed the patient's medical chart.  ----------------------------------------- 6:54 PM on  06/01/2017 -----------------------------------------  The patient has a white blood cell count of 18.9, lactic acid of 3.5, with a urinary tract infection.  I have ordered a dose of Levaquin for her, which should also cover her for pulmonary pathology if she has an early pneumonia.  In the room, the patient does have a good tracing with a O2 sats of 88% on  room air.  She goes up to 96% with 3 L nasal cannula.  On the patient's chest x-ray, she has bibasilar basilar atelectasis and pulmonary vascular congestion but no significant edema.  Given her most likely infectious state, I have ordered additional fluid, and we will monitor her pulmonary status closely.  ----------------------------------------- 8:10 PM on 06/01/2017 -----------------------------------------  The patient has a very complicated picture in the emergency department today.  She has a UTI and sepsis.  In addition, she has an acute CHF exacerbation without any significant pulmonary edema.  She also has ischemic changes on her EKG which do not meet criteria for STEMI, but does have an elevated troponin of 0.14.  She is not actively having any chest pain, so we will treat her with aspirin and continue to monitor her cardiac status.  The patient has a stable hyponatremia. She also has hyperglycemia w/o DKA. At this time, we will plan to admit the patient.  I have discussed the patient's results with the patient as well as her daughter, who are in agreement with the plan.  ----------------------------------------- 8:23 PM on 06/01/2017 -----------------------------------------  I have repeated the patient's EKG which does appear to have slightly less strain and ischemic changes.  However, I have spoken with the patient again and she still denies any chest pain but does state that she has been having chest "heaviness" that has been constant for the past 2 days.  At this time, given her positive troponin and chest heaviness with ischemic  changes on EKG, will write her for heparinization.  Given the patient's active infectious state and improving ischemic changes as she is clinically improving, this patient is not a candidate for cardiac catheterization.  CRITICAL CARE Performed by: Eula Listen   Total critical care time: 60 minutes  Critical care time was exclusive of separately billable procedures and treating other patients.  Critical care was necessary to treat or prevent imminent or life-threatening deterioration.  Critical care was time spent personally by me on the following activities: development of treatment plan with patient and/or surrogate as well as nursing, discussions with consultants, evaluation of patient's response to treatment, examination of patient, obtaining history from patient or surrogate, ordering and performing treatments and interventions, ordering and review of laboratory studies, ordering and review of radiographic studies, pulse oximetry and re-evaluation of patient's condition.   ____________________________________________  FINAL CLINICAL IMPRESSION(S) / ED DIAGNOSES  Final diagnoses:  Acute cystitis without hematuria  Sepsis, due to unspecified organism ([place])  Hypoxia  NSTEMI (non-ST elevated myocardial infarction) ([place])  Acute on chronic congestive heart failure, unspecified heart failure type ([place])         NEW MEDICATIONS STARTED DURING THIS VISIT:  This SmartLink is deprecated. Use AVSMEDLIST instead to display the medication list for a patient.    Eula Listen, MD 06/01/17 2013    Eula Listen, MD 06/01/17 2015    Eula Listen, MD 06/01/17 4431    Eula Listen, MD 06/01/17 5400    Eula Listen, MD 06/01/17 8676    Eula Listen, MD 06/01/17 2026

## 2017-06-01 NOTE — ED Triage Notes (Signed)
Pt arrived via EMS from home for reports of respiratory distress. EMS reports initially SpO2 98% on 6L. EMS reports during transport pt SpO2 decreased and pt coughed up pink frothy sputum. EMS applied CPAP and pt Spo2 remained in 70s. EMS reports 184/133, 92 HR, NSR, 76% on CPAP.

## 2017-06-01 NOTE — ED Notes (Signed)
Patient transported to CT at this time. 

## 2017-06-01 NOTE — Consult Note (Signed)
Name: Jacqueline [name] MRN: [id number] DOB: 09-08-1932    ADMISSION DATE:  06/01/2017 CONSULTATION DATE: 06/01/2017  REFERRING MD : Dr. Jerelyn Charles   CHIEF COMPLAINT: Shortness of Breath   BRIEF PATIENT DESCRIPTION:  83 yo female admitted 12/17 with acute on chronic respiratory failure secondary to CHF exacerbation, suspected ST elevation, and possible sepsis secondary to UTI   SIGNIFICANT EVENTS  12/17-Pt admitted to ICU  STUDIES:  CT Angio Chest 12/17>>Negative for pulmonary embolism.  Pulmonary artery hypertension Diffuse atherosclerotic disease including the coronary artery. Cirrhosis Aortic Atherosclerosis   HISTORY OF PRESENT ILLNESS:   This is a 83 yo female with PMH of Renal Disorder, Hypothyroidism, HTN, Hyperlipidemia, GERD, Dilated Idiopathic Cardiomyopathy, Diabetes Mellitus, and CHF.  She presented to [facility] ER 12/17 via EMS with c/o shortness of breath.  Upon EMS arrival the pts O2 sats were 98% on 6L of O2.  However pt developed a cough with pink frothy sputum production and O2 sats decreased to the 70's requiring CPAP en route to ER.  In the ER the pts O2 sats were 100% on RA, however she was noted to be tachypneic.  Lab results revealed creatinine 1.48, BNP 1,128, troponin 0.14, wbc 18.9, lactic acid 3.5, and UA revealing UTI.  Pt ruled in for possible sepsis, therefore she received 1.5L NS bolus.  CXR revealed pulmonary edema, CT Angio Chest negative for PE, and EKG results indicated 1st degree AV block with ST elevation in V1, V2, and V3, therefore pt met criteria for STEMI Heparin gtt initiated and stat Cardiology consult placed.  She was subsequently admitted to ICU by hospitalist team for further workup and treatment.     PAST MEDICAL HISTORY :   has a past medical history of CHF (congestive heart failure) ([place]), Diabetes mellitus without complication ([place]) (74/25/9563), Dilated idiopathic cardiomyopathy ([place]), GERD (gastroesophageal reflux disease), Hyperlipidemia,  Hypertension, Hypothyroidism, and Renal disorder.  has a past surgical history that includes Bunionectomy; Tonsillectomy; and Colonoscopy. Prior to Admission medications   Medication Sig Start Date End Date Taking? Authorizing Provider  aspirin EC 81 MG tablet Take 81 mg by mouth daily.   Yes [provider]  carvedilol (COREG) 12.5 MG tablet Take 12.5 mg by mouth 2 (two) times daily with a meal.   Yes [provider]  ergocalciferol (VITAMIN D2) 50000 units capsule Take 50,000 Units by mouth once a week.   Yes [provider]  fenofibrate (TRICOR) 145 MG tablet Take 145 mg by mouth daily.   Yes [provider]  furosemide (LASIX) 40 MG tablet Take 20-40 mg by mouth 2 (two) times daily. Take 40 mg in the morning and 20 mg in the evening.   Yes [provider]  Iron-FA-B Cmp-C-Biot-Probiotic (FUSION PLUS) CAPS Take 1 capsule by mouth daily.    Yes [provider]  levothyroxine (SYNTHROID, LEVOTHROID) 25 MCG tablet Take 25 mcg by mouth daily before breakfast.   Yes [provider]  meclizine (ANTIVERT) 25 MG tablet Take 12.5 mg by mouth 2 (two) times daily.   Yes [provider]  pantoprazole (PROTONIX) 40 MG tablet Take 40 mg by mouth daily.   Yes [provider]  potassium chloride (K-DUR) 10 MEQ tablet Take 10 mEq by mouth daily.   Yes [provider]  ramipril (ALTACE) 10 MG capsule Take 20 mg by mouth daily.    Yes [provider]  sertraline (ZOLOFT) 50 MG tablet Take 50 mg by mouth daily.   Yes [provider]  glucose blood (FREESTYLE LITE) test strip USE TO CHECK BLOOD SUGARS TWICE A DAY 02/19/16   [provider]   Allergies  Allergen Reactions  . Ciprofloxacin   . Digoxin And Related   . Erythromycin   . Lopid [Gemfibrozil]   . Nitrofurantoin   . Cefuroxime Rash  . Penicillins Rash    Has patient had a PCN reaction causing immediate rash, facial/tongue/throat  swelling, SOB or lightheadedness with hypotension: Unknown Has patient had a PCN reaction causing severe rash involving mucus membranes or skin necrosis: No Has patient had a PCN reaction that required hospitalization: No Has patient had a PCN reaction occurring within the last 10 years: No If all of the above answers are "NO", then may proceed with Cephalosporin use.     FAMILY HISTORY:  family history includes Premature CHD in her brother. SOCIAL HISTORY:  reports that she has quit smoking. she has never used smokeless tobacco. She reports that she does not drink alcohol or use drugs.  REVIEW OF SYSTEMS: Positives in BOLD  Constitutional: Negative for fever, chills, weight loss, malaise/fatigue and diaphoresis.  HENT: Negative for hearing loss, ear pain, nosebleeds, congestion, sore throat, neck pain, tinnitus and ear discharge.   Eyes: Negative for blurred vision, double vision, photophobia, pain, discharge and redness.  Respiratory: cough, hemoptysis, sputum production, shortness of breath, wheezing and stridor.   Cardiovascular: chest pain, palpitations, orthopnea, claudication, leg swelling and PND.  Gastrointestinal: Negative for heartburn, nausea, vomiting, abdominal pain, diarrhea, constipation, blood in stool and melena.  Genitourinary: Negative for dysuria, urgency, frequency, hematuria and flank pain.  Musculoskeletal: Negative for myalgias, back pain, joint pain and falls.  Skin: Negative for itching and rash.  Neurological: Negative for dizziness, tingling, tremors, sensory change, speech change, focal weakness, seizures, loss of consciousness, weakness and headaches.  Endo/Heme/Allergies: Negative for environmental allergies and polydipsia. Does not bruise/bleed easily.  SUBJECTIVE:  Pt states breathing has improved.   VITAL SIGNS: Temp:  [99 F (37.2 C)] 99 F (37.2 C) (12/17 1743) Pulse Rate:  [79-94] 79 (12/17 2102) Resp:  [18-34] 18 (12/17 2102) BP:  (102-167)/(46-83) 102/46 (12/17 2102) SpO2:  [96 %-100 %] 98 % (12/17 2102) Weight:  [59 kg (130 lb)] 59 kg (130 lb) (12/17 1739)  PHYSICAL EXAMINATION: General: well developed, well nourished female, NAD  Neuro: alert and oriented, follows commands  HEENT: supple, no JVD  Cardiovascular: sinus rhythm, rrr, no M/R/G  Lungs: crackles throughout, even, non labored  Abdomen: +BS x4, soft, non tender, non distended  Musculoskeletal: normal bulk and tone, no edema  Skin: intact no rashes or lesions  Recent Labs  Lab 06/01/17 1924  NA 130*  K 4.8  CL 97*  CO2 25  BUN 51*  CREATININE 1.48*  GLUCOSE 260*   Recent Labs  Lab 06/01/17 1739  HGB 13.3  HCT 40.0  WBC 18.9*  PLT 234   Ct Angio Chest Pe W And/or Wo Contrast  Result Date: 06/01/2017 CLINICAL DATA:  Short of breath EXAM: CT ANGIOGRAPHY CHEST WITH CONTRAST TECHNIQUE: Multidetector CT imaging of the chest was performed using the standard protocol during bolus administration of intravenous contrast. Multiplanar CT image reconstructions and MIPs were obtained to evaluate the vascular anatomy. CONTRAST:  18m ISOVUE-370 IOPAMIDOL (ISOVUE-370) INJECTION 76% COMPARISON:  Chest 06/01/2017 FINDINGS: Cardiovascular: Negative for pulmonary embolism. Pulmonary artery enlargement bilaterally compatible with pulmonary artery hypertension Extensive atherosclerotic calcification aortic arch and coronary artery. Heart size within normal limits. No pericardial effusion Mediastinum/Nodes:  Negative for mass or adenopathy Lungs/Pleura: Scarring right medial upper lobe. Chronic lung disease. Negative for infiltrate or effusion. Partially calcified nodule right lung base unchanged from prior PET-CT 03/13/2017 Upper Abdomen: Cirrhosis of the liver. No ascites. Spleen not enlarged Musculoskeletal: Negative Review of the MIP images confirms the above findings. IMPRESSION: Negative for pulmonary embolism.  Pulmonary artery hypertension Diffuse  atherosclerotic disease including the coronary artery. Cirrhosis Aortic Atherosclerosis (ICD10-I70.0). Electronically Signed   By: Franchot Gallo M.D.   On: 06/01/2017 21:02   Dg Chest Port 1 View  Result Date: 06/01/2017 CLINICAL DATA:  Short of breath EXAM: PORTABLE CHEST 1 VIEW COMPARISON:  03/02/2013 FINDINGS: Cardiac enlargement with mild vascular congestion. Negative for edema or effusion. Mild bibasilar atelectasis. Sclerotic benign appearing lesion proximal left humerus unchanged IMPRESSION: Mild bibasilar atelectasis. Pulmonary vascular congestion Atherosclerotic aorta Electronically Signed   By: Franchot Gallo M.D.   On: 06/01/2017 18:39    ASSESSMENT / PLAN: Acute on chronic respiratory failure secondary to CHF exacerbation Pulmonary HTN-revealed on CT Angio Chest 06/01/17 Elevated troponin per Cardiology likely demand ischemia in setting of CHF exacerbation and possible sepsis  UTI  Hx: Diabetes Mellitus, Renal Disorder, GERD, and Dilated Idiopathic Cardiomyopathy  P: Supplemental O2 for dyspnea and/or to maintain O2 sats >92% IV lasix Repeat CXR in am  Trend troponin's  Heparin gtt dosing per pharmacy Cardiology consulted appreciate input Continue outpatient cardiac medications  Trend CBC Monitor for s/sx of bleeding Transfuse for hgb <7  Trend WBC and monitor fever curve Trend lactic acid and PCT Continue abx  Follow cultures  CBG's q4hrs and SSI  Prn morphine for pain management   Marda Stalker, [place] Pager [phone] (please enter 7 digits) PCCM Consult Pager [phone] (please enter 7 digits)

## 2017-06-01 NOTE — ED Notes (Signed)
Patient transported to CT 

## 2017-06-02 ENCOUNTER — Inpatient Hospital Stay
Admit: 2017-06-02 | Discharge: 2017-06-02 | Disposition: A | Discharge status: Home / Self Care | Payer: Medicare Other | Attending: Family Medicine | Admitting: Family Medicine

## 2017-06-02 ENCOUNTER — Other Ambulatory Visit: Payer: Self-pay

## 2017-06-02 ENCOUNTER — Inpatient Hospital Stay: Payer: Medicare Other

## 2017-06-02 LAB — BLOOD CULTURE ID PANEL (REFLEXED)
Acinetobacter baumannii: NOT DETECTED
CANDIDA KRUSEI: NOT DETECTED
CANDIDA PARAPSILOSIS: NOT DETECTED
Candida albicans: NOT DETECTED
Candida glabrata: NOT DETECTED
Candida tropicalis: NOT DETECTED
ESCHERICHIA COLI: NOT DETECTED
Enterobacter cloacae complex: NOT DETECTED
Enterobacteriaceae species: NOT DETECTED
Enterococcus species: NOT DETECTED
Haemophilus influenzae: NOT DETECTED
KLEBSIELLA OXYTOCA: NOT DETECTED
Klebsiella pneumoniae: NOT DETECTED
Listeria monocytogenes: NOT DETECTED
Methicillin resistance: DETECTED — AB
Neisseria meningitidis: NOT DETECTED
Proteus species: NOT DETECTED
Pseudomonas aeruginosa: NOT DETECTED
SERRATIA MARCESCENS: NOT DETECTED
STAPHYLOCOCCUS AUREUS BCID: NOT DETECTED
STAPHYLOCOCCUS SPECIES: DETECTED — AB
STREPTOCOCCUS PNEUMONIAE: NOT DETECTED
Streptococcus agalactiae: NOT DETECTED
Streptococcus pyogenes: NOT DETECTED
Streptococcus species: NOT DETECTED

## 2017-06-02 LAB — CBC
HCT: 33.9 % — ABNORMAL LOW (ref 35.0–47.0)
HEMOGLOBIN: 11.3 g/dL — AB (ref 12.0–16.0)
MCH: 30.5 pg (ref 26.0–34.0)
MCHC: 33.3 g/dL (ref 32.0–36.0)
MCV: 91.5 fL (ref 80.0–100.0)
PLATELETS: 178 10*3/uL (ref 150–440)
RBC: 3.7 MIL/uL — AB (ref 3.80–5.20)
RDW: 13.4 % (ref 11.5–14.5)
WBC: 11.1 10*3/uL — ABNORMAL HIGH (ref 3.6–11.0)

## 2017-06-02 LAB — GLUCOSE, CAPILLARY
GLUCOSE-CAPILLARY: 157 mg/dL — AB (ref 65–99)
GLUCOSE-CAPILLARY: 92 mg/dL (ref 65–99)
GLUCOSE-CAPILLARY: 111 mg/dL — AB (ref 65–99)
GLUCOSE-CAPILLARY: 115 mg/dL — AB (ref 65–99)
Glucose-Capillary: 97 mg/dL (ref 65–99)

## 2017-06-02 LAB — TROPONIN I
Troponin I: 0.22 ng/mL (ref <0.03)
Troponin I: 0.18 ng/mL (ref <0.03)

## 2017-06-02 LAB — MRSA PCR SCREENING: MRSA BY PCR: NEGATIVE

## 2017-06-02 LAB — HEPARIN LEVEL (UNFRACTIONATED)
HEPARIN UNFRACTIONATED: 0.28 [IU]/mL — AB (ref 0.30–0.70)
Heparin Unfractionated: 0.19 IU/mL — ABNORMAL LOW (ref 0.30–0.70)

## 2017-06-02 MED ORDER — HEPARIN (PORCINE) IN NACL 100-0.45 UNIT/ML-% IJ SOLN
900.0000 [IU]/h | INTRAMUSCULAR | Status: DC
  Administered 2017-06-02: 900 [IU]/h via INTRAVENOUS
  Filled 2017-06-02: qty 250

## 2017-06-02 MED ORDER — LEVOFLOXACIN 750 MG PO TABS: 750.0000 mg | ORAL_TABLET | ORAL | Status: DC

## 2017-06-02 MED ORDER — HEPARIN BOLUS VIA INFUSION
1750.0000 [IU] | Freq: Once | INTRAVENOUS | Status: AC
  Administered 2017-06-02: 1750 [IU] via INTRAVENOUS
  Filled 2017-06-02: qty 1750

## 2017-06-02 MED ORDER — LEVOFLOXACIN 750 MG PO TABS
750.0000 mg | ORAL_TABLET | ORAL | Status: DC
  Administered 2017-06-03: 750 mg via ORAL
  Filled 2017-06-02 (×2): qty 1

## 2017-06-02 MED ORDER — SODIUM CHLORIDE 0.9% FLUSH
3.0000 mL | Freq: Two times a day (BID) | INTRAVENOUS | Status: DC
  Administered 2017-06-02 – 2017-06-04 (×4): 3 mL via INTRAVENOUS

## 2017-06-02 NOTE — Progress Notes (Signed)
PHARMACY - PHYSICIAN COMMUNICATION CRITICAL VALUE ALERT - BLOOD CULTURE IDENTIFICATION (BCID)  No results found for this or any previous visit.  Name of physician (or Provider) Contacted: Kalisetti   Changes to prescribed antibiotics required:   No, will watch for signs of infection.   Teriana Danker D 06/02/2017  8:15 PM

## 2017-06-02 NOTE — Progress Notes (Signed)
Pharmacy Antibiotic Note  Jacqueline [name] is a 83 y.o. female admitted on 06/01/2017 with UTI.  Pharmacy has been consulted for levofloxacin dosing. Patient has multiple antibiotic allergies.   Plan: Patient tolerated levofloxacin dose in ED, continue levofloxacin 750mg  PO Q48hr with next scheduled dose on 12/19.   Weight: 130 lb (59 kg)  Temp (24hrs), Avg:98.7 F (37.1 C), Min:98.2 F (36.8 C), Max:99 F (37.2 C)  Recent Labs  Lab 06/01/17 1739 06/01/17 1924 06/01/17 1931 06/02/17 0437  WBC 18.9*  --   --  11.1*  CREATININE  --  1.48*  --   --   LATICACIDVEN 3.5*  --  1.4  --     Estimated Creatinine Clearance: 24.2 mL/min (A) (by C-G formula based on SCr of 1.48 mg/dL (H)).    Allergies  Allergen Reactions  . Ciprofloxacin   . Digoxin And Related   . Erythromycin   . Lopid [Gemfibrozil]   . Nitrofurantoin   . Cefuroxime Rash  . Penicillins Rash    Has patient had a PCN reaction causing immediate rash, facial/tongue/throat swelling, SOB or lightheadedness with hypotension: Unknown Has patient had a PCN reaction causing severe rash involving mucus membranes or skin necrosis: No Has patient had a PCN reaction that required hospitalization: No Has patient had a PCN reaction occurring within the last 10 years: No If all of the above answers are "NO", then may proceed with Cephalosporin use.     Antimicrobials this admission: Meropenem x 1 12/17 Levofloxacin 12/17 >> 12/25  Dose adjustments this admission: N/A  Microbiology results: 12/17 BCx: no growth < 12 hours  12/17 UCx: pending  12/17 MRSA PCR: negative   Thank you for allowing pharmacy to be a part of this patient's care.  Deantre Bourdon L 06/02/2017 3:04 PM

## 2017-06-02 NOTE — Consult Note (Signed)
[facility]  CARDIOLOGY CONSULT NOTE  Patient ID: Jacqueline [name] MRN: [id number] DOB/AGE: 81-Mar-1934 81 y.o.  Admit date: 06/01/2017 Referring Physician Verdell Carmine Primary Physician [facility] Primary Cardiologist Earleen Newport Reason for Consultation congestive heart failure  HPI: 83 year old female referred for evaluation of congestive heart failure.  The patient has known history of dilated cardiomyopathy, with LV ejection fraction of 20%.  She was in her usual state of health until 2 days prior to admission when she developed symptoms consistent with a urinary tract infection.  She presents to [facility] emergency room with shortness of breath.  Urinalysis was consistent with UTI.  Admission labs were notable for white count of 18,900.  EKG revealed sinus rhythm, LVH with nonspecific ST-T wave abnormalities.  Initial troponin was 0.14.  The patient speaks little English, but does not complain of chest pain.  The patient was diuresed, started on heparin drip, and according to the daughter feels much better, laying in flat in bed, without shortness of breath.  Follow-up troponins were 0.26 and 0.22.  Other notable admission labs include BUN and creatinine of 51 and 1.48, respectively.  Review of systems complete and found to be negative unless listed above     Past Medical History:  Diagnosis Date  . CHF (congestive heart failure) ([place])   . Diabetes mellitus without complication ([place]) 08/65/7846   Currently no high BS.  Has come off meds.  But having episodes of low BS now.  . Dilated idiopathic cardiomyopathy ([place])   . GERD (gastroesophageal reflux disease)   . Hyperlipidemia   . Hypertension   . Hypothyroidism   . Renal disorder     Past Surgical History:  Procedure Laterality Date  . BUNIONECTOMY    . COLONOSCOPY    . TONSILLECTOMY      Medications Prior to Admission  Medication Sig Dispense Refill Last Dose  . aspirin EC 81 MG tablet Take 81 mg by mouth daily.   06/01/2017 at Unknown time  .  carvedilol (COREG) 12.5 MG tablet Take 12.5 mg by mouth 2 (two) times daily with a meal.   06/01/2017 at Unknown time  . ergocalciferol (VITAMIN D2) 50000 units capsule Take 50,000 Units by mouth once a week.   06/01/2017 at Unknown time  . fenofibrate (TRICOR) 145 MG tablet Take 145 mg by mouth daily.   06/01/2017 at Unknown time  . furosemide (LASIX) 40 MG tablet Take 20-40 mg by mouth 2 (two) times daily. Take 40 mg in the morning and 20 mg in the evening.   06/01/2017 at Unknown time  . Iron-FA-B Cmp-C-Biot-Probiotic (FUSION PLUS) CAPS Take 1 capsule by mouth daily.    06/01/2017 at Unknown time  . levothyroxine (SYNTHROID, LEVOTHROID) 25 MCG tablet Take 25 mcg by mouth daily before breakfast.   06/01/2017 at Unknown time  . meclizine (ANTIVERT) 25 MG tablet Take 12.5 mg by mouth 2 (two) times daily.   06/01/2017 at Unknown time  . pantoprazole (PROTONIX) 40 MG tablet Take 40 mg by mouth daily.   06/01/2017 at Unknown time  . potassium chloride (K-DUR) 10 MEQ tablet Take 10 mEq by mouth daily.   06/01/2017 at Unknown time  . ramipril (ALTACE) 10 MG capsule Take 20 mg by mouth daily.    06/01/2017 at Unknown time  . sertraline (ZOLOFT) 50 MG tablet Take 50 mg by mouth daily.   06/01/2017 at Unknown time  . glucose blood (FREESTYLE LITE) test strip USE TO CHECK BLOOD SUGARS TWICE A DAY  Social History   Socioeconomic History  . Marital status: Widowed    Spouse name: Not on file  . Number of children: Not on file  . Years of education: Not on file  . Highest education level: Not on file  Social Needs  . Financial resource strain: Not on file  . Food insecurity - worry: Not on file  . Food insecurity - inability: Not on file  . Transportation needs - medical: Not on file  . Transportation needs - non-medical: Not on file  Occupational History  . Not on file  Tobacco Use  . Smoking status: Former Research scientist (life sciences)  . Smokeless tobacco: Never Used  Substance and Sexual Activity  . Alcohol  use: No  . Drug use: No  . Sexual activity: No  Other Topics Concern  . Not on file  Social History Narrative  . Not on file    Family History  Problem Relation Age of Onset  . Premature CHD Brother       Review of systems complete and found to be negative unless listed above      PHYSICAL EXAM  General: Well developed, well nourished, in no acute distress HEENT:  Normocephalic and atramatic Neck:  No JVD.  Lungs: Clear bilaterally to auscultation and percussion. Heart: HRRR . Normal S1 and S2 without gallops or murmurs.  Abdomen: Bowel sounds are positive, abdomen soft and non-tender  Msk:  Back normal, normal gait. Normal strength and tone for age. Extremities: No clubbing, cyanosis or edema.   Neuro: Alert and oriented X 3. Psych:  Good affect, responds appropriately  Labs:   Lab Results  Component Value Date   WBC 11.1 (H) 06/02/2017   HGB 11.3 (L) 06/02/2017   HCT 33.9 (L) 06/02/2017   MCV 91.5 06/02/2017   PLT 178 06/02/2017    Recent Labs  Lab 06/01/17 1924  NA 130*  K 4.8  CL 97*  CO2 25  BUN 51*  CREATININE 1.48*  CALCIUM 8.7*  PROT 7.0  BILITOT 1.4*  ALKPHOS 48  ALT 13*  AST 33  GLUCOSE 260*   Lab Results  Component Value Date   CKTOTAL 187 02/19/2013   CKMB 1.6 02/19/2013   TROPONINI 0.22 ([place]) 06/02/2017   No results found for: CHOL No results found for: HDL No results found for: LDLCALC No results found for: TRIG No results found for: CHOLHDL No results found for: LDLDIRECT    Radiology: Ct Angio Chest Pe W And/or Wo Contrast  Result Date: 06/01/2017 CLINICAL DATA:  Short of breath EXAM: CT ANGIOGRAPHY CHEST WITH CONTRAST TECHNIQUE: Multidetector CT imaging of the chest was performed using the standard protocol during bolus administration of intravenous contrast. Multiplanar CT image reconstructions and MIPs were obtained to evaluate the vascular anatomy. CONTRAST:  49mL ISOVUE-370 IOPAMIDOL (ISOVUE-370) INJECTION 76% COMPARISON:   Chest 06/01/2017 FINDINGS: Cardiovascular: Negative for pulmonary embolism. Pulmonary artery enlargement bilaterally compatible with pulmonary artery hypertension Extensive atherosclerotic calcification aortic arch and coronary artery. Heart size within normal limits. No pericardial effusion Mediastinum/Nodes: Negative for mass or adenopathy Lungs/Pleura: Scarring right medial upper lobe. Chronic lung disease. Negative for infiltrate or effusion. Partially calcified nodule right lung base unchanged from prior PET-CT 03/13/2017 Upper Abdomen: Cirrhosis of the liver. No ascites. Spleen not enlarged Musculoskeletal: Negative Review of the MIP images confirms the above findings. IMPRESSION: Negative for pulmonary embolism.  Pulmonary artery hypertension Diffuse atherosclerotic disease including the coronary artery. Cirrhosis Aortic Atherosclerosis (ICD10-I70.0). Electronically Signed   By: Juanda Crumble  Carlis Abbott M.D.   On: 06/01/2017 21:02   Dg Chest Port 1 View  Result Date: 06/01/2017 CLINICAL DATA:  Short of breath EXAM: PORTABLE CHEST 1 VIEW COMPARISON:  03/02/2013 FINDINGS: Cardiac enlargement with mild vascular congestion. Negative for edema or effusion. Mild bibasilar atelectasis. Sclerotic benign appearing lesion proximal left humerus unchanged IMPRESSION: Mild bibasilar atelectasis. Pulmonary vascular congestion Atherosclerotic aorta Electronically Signed   By: Franchot Gallo M.D.   On: 06/01/2017 18:39    EKG: Normal sinus rhythm, LVH, nonspecific ST-T wave abnormalities  ASSESSMENT AND PLAN:   1.  Acute on chronic systolic congestive heart failure, improved after diuresis 2.  Borderline elevated troponin, likely due to demand supply ischemia, multifactorial, secondary to acute on chronic systolic congestive heart failure, and urosepsis 3.  Dilated cardiomyopathy, of unknown etiology, idiopathic, versus long-standing essential hypertension 4.  Abnormal ECG, LVH with nonspecific ST-T wave abnormalities,  no evidence for ST elevation myocardial infarction  Recommendations  1.  Agree with overall current therapy 2.  Continue diuresis 3.  Carefully monitor renal status 4.  Continue heparin drip 24-48 hours 5.  Review 2D echocardiogram 6.  Initiated conversation with patient's daughter about the potential for right and left heart cardiac catheterization since etiology of dilated cardiomyopathy has not been determined.  In light of patient's reduced ejection fraction and systolic congestive heart failure, patient is also a candidate for elective ICD.  Will discuss further with patient and patient's daughter during this hospitalization.  For now, we will continue conservative management.  Signed: Isaias Cowman MD,PhD, [facility] 06/02/2017, 8:59 AM

## 2017-06-02 NOTE — Progress Notes (Signed)
[facility] [place] NAME: Jacqueline [name]    MR#:  [id number]  DATE OF BIRTH:  [birth year]  SUBJECTIVE:   Patient here due to shortness of breath secondary to congestive heart failure with underlying dilated cardiomyopathy. Also noted to have urinary tract infection. Feels better today. Denies any chest pains, nausea, vomiting.  REVIEW OF SYSTEMS:    Review of Systems  Constitutional: Negative for chills and fever.  HENT: Negative for congestion and tinnitus.   Eyes: Negative for blurred vision and double vision.  Respiratory: Negative for cough, shortness of breath and wheezing.   Cardiovascular: Negative for chest pain, orthopnea and PND.  Gastrointestinal: Negative for abdominal pain, diarrhea, nausea and vomiting.  Genitourinary: Negative for dysuria and hematuria.  Neurological: Negative for dizziness, sensory change and focal weakness.  All other systems reviewed and are negative.   Nutrition: Heart Healthy Tolerating Diet: Yes Tolerating PT: Await Eval.   DRUG ALLERGIES:   Allergies  Allergen Reactions  . Ciprofloxacin   . Digoxin And Related   . Erythromycin   . Lopid [Gemfibrozil]   . Nitrofurantoin   . Cefuroxime Rash  . Penicillins Rash    Has patient had a PCN reaction causing immediate rash, facial/tongue/throat swelling, SOB or lightheadedness with hypotension: Unknown Has patient had a PCN reaction causing severe rash involving mucus membranes or skin necrosis: No Has patient had a PCN reaction that required hospitalization: No Has patient had a PCN reaction occurring within the last 10 years: No If all of the above answers are "NO", then may proceed with Cephalosporin use.     VITALS:  Blood pressure (!) 107/43, pulse 73, temperature 98.2 F (36.8 C), temperature source Oral, resp. rate 14, height 4\' 10"  (1.473 m), weight 59 kg (130 lb), SpO2 97 %.  PHYSICAL EXAMINATION:   Physical Exam  GENERAL:  83  y.o.-year-old patient lying in bed in no acute distress.  EYES: Pupils equal, round, reactive to light and accommodation. No scleral icterus. Extraocular muscles intact.  HEENT: Head atraumatic, normocephalic. Oropharynx and nasopharynx clear.  NECK:  Supple, no jugular venous distention. No thyroid enlargement, no tenderness.  LUNGS: Normal breath sounds bilaterally, no wheezing, rales, rhonchi. No use of accessory muscles of respiration.  CARDIOVASCULAR: S1, S2 normal. No murmurs, rubs, or gallops.  ABDOMEN: Soft, nontender, nondistended. Bowel sounds present. No organomegaly or mass.  EXTREMITIES: No cyanosis, clubbing or +1-2 edema b/l.    NEUROLOGIC: Cranial nerves II through XII are intact. No focal Motor or sensory deficits b/l.   PSYCHIATRIC: The patient is alert and oriented x 3.  SKIN: No obvious rash, lesion, or ulcer.    LABORATORY PANEL:   CBC Recent Labs  Lab 06/02/17 0437  WBC 11.1*  HGB 11.3*  HCT 33.9*  PLT 178   ------------------------------------------------------------------------------------------------------------------  Chemistries  Recent Labs  Lab 06/01/17 1924  NA 130*  K 4.8  CL 97*  CO2 25  GLUCOSE 260*  BUN 51*  CREATININE 1.48*  CALCIUM 8.7*  AST 33  ALT 13*  ALKPHOS 48  BILITOT 1.4*   ------------------------------------------------------------------------------------------------------------------  Cardiac Enzymes Recent Labs  Lab 06/02/17 1031  TROPONINI 0.18*   ------------------------------------------------------------------------------------------------------------------  RADIOLOGY:  Ct Angio Chest Pe W And/or Wo Contrast  Result Date: 06/01/2017 CLINICAL DATA:  Short of breath EXAM: CT ANGIOGRAPHY CHEST WITH CONTRAST TECHNIQUE: Multidetector CT imaging of the chest was performed using the standard protocol during bolus administration of intravenous contrast. Multiplanar CT image reconstructions and  MIPs were obtained to  evaluate the vascular anatomy. CONTRAST:  52mL ISOVUE-370 IOPAMIDOL (ISOVUE-370) INJECTION 76% COMPARISON:  Chest 06/01/2017 FINDINGS: Cardiovascular: Negative for pulmonary embolism. Pulmonary artery enlargement bilaterally compatible with pulmonary artery hypertension Extensive atherosclerotic calcification aortic arch and coronary artery. Heart size within normal limits. No pericardial effusion Mediastinum/Nodes: Negative for mass or adenopathy Lungs/Pleura: Scarring right medial upper lobe. Chronic lung disease. Negative for infiltrate or effusion. Partially calcified nodule right lung base unchanged from prior PET-CT 03/13/2017 Upper Abdomen: Cirrhosis of the liver. No ascites. Spleen not enlarged Musculoskeletal: Negative Review of the MIP images confirms the above findings. IMPRESSION: Negative for pulmonary embolism.  Pulmonary artery hypertension Diffuse atherosclerotic disease including the coronary artery. Cirrhosis Aortic Atherosclerosis (ICD10-I70.0). Electronically Signed   By: Franchot Gallo M.D.   On: 06/01/2017 21:02   Portable Chest X-ray 1 View  Result Date: 06/02/2017 CLINICAL DATA:  CHF. EXAM: PORTABLE CHEST 1 VIEW COMPARISON:  06/01/2017 chest radiograph and CTA FINDINGS: The cardiac silhouette is borderline enlarged. Aortic atherosclerosis is noted. There is central pulmonary artery enlargement as described on CT. Slight interstitial prominence is similar to the prior study without overt alveolar edema. No sizable pleural effusion or pneumothorax is seen. A sclerotic lesion is again partially visualized in the proximal left humerus, benign in appearance. IMPRESSION: Mild pulmonary vascular congestion without overt edema. Electronically Signed   By: Logan Bores M.D.   On: 06/02/2017 09:16   Dg Chest Port 1 View  Result Date: 06/01/2017 CLINICAL DATA:  Short of breath EXAM: PORTABLE CHEST 1 VIEW COMPARISON:  03/02/2013 FINDINGS: Cardiac enlargement with mild vascular congestion.  Negative for edema or effusion. Mild bibasilar atelectasis. Sclerotic benign appearing lesion proximal left humerus unchanged IMPRESSION: Mild bibasilar atelectasis. Pulmonary vascular congestion Atherosclerotic aorta Electronically Signed   By: Franchot Gallo M.D.   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  1. CHF-acute on chronic systolic dysfunction. -This is the cause of patient's worsening shortness of breath. Patient has underlying severe cardiomyopathy with ejection fraction of 30%. Unclear this is ischemic or nonischemic. -Continue diuresis with IV Lasix, follow I's and O's and daily weights. Continue carvedilol, Ramipril. -Appreciate cardiology input and continue current care.  2. Elevated troponin-?? Non-ST elevation MI versus demand ischemia. -Patient does have a severe dilated cardiomyopathy and it's unclear whether it ischemic or nonischemic. -Troponins are not trending upwards. Seen by cardiology, repeat echocardiogram pending. Cardiology is contemplating doing a cardiac catheterization to workup ischemic heart disease in family to make a decision on that. -Continue aspirin, heparin drip, statin, carvedilol.  3. Urinary tract infection-continue IV Levaquin, follow urine cultures.  4. History of dilated cardiomyopathy-unclear but this is ischemic and nonischemic ischemic. -Cardiology contemplating doing possible cardiac catheterization to workup ischemic heart disease. Continue supportive care for now as mentioned above.  5. Chronic kidney disease stage III to 4-creatinine close to baseline and will continue to monitor.  6. Hypothyroidism-continue Synthroid.   7. Depression-continue Zoloft.   All the records are reviewed and  case discussed with Care Management/Social Worker. Management plans discussed with the patient, family and they are in agreement.  CODE STATUS: DO NOT RESUSCITATE  DVT Prophylaxis: Heparin drip  TOTAL TIME TAKING CARE OF THIS PATIENT: 30 minutes.   POSSIBLE D/C IN 1-2  DAYS, DEPENDING ON CLINICAL CONDITION.   Henreitta Leber M.D on 06/02/2017 at 5:10 PM  Between 7am to 6pm - Pager - [phone]  After 6pm go to [email or web address] - Proofreader  [facility]  Office  [phone]  CC: Primary care physician; Tracie Harrier, MD

## 2017-06-02 NOTE — Progress Notes (Signed)
[place] for heparin Indication: chest pain/ACS  Allergies  Allergen Reactions  . Ciprofloxacin   . Digoxin And Related   . Erythromycin   . Lopid [Gemfibrozil]   . Nitrofurantoin   . Cefuroxime Rash  . Penicillins Rash    Has patient had a PCN reaction causing immediate rash, facial/tongue/throat swelling, SOB or lightheadedness with hypotension: Unknown Has patient had a PCN reaction causing severe rash involving mucus membranes or skin necrosis: No Has patient had a PCN reaction that required hospitalization: No Has patient had a PCN reaction occurring within the last 10 years: No If all of the above answers are "NO", then may proceed with Cephalosporin use.     Patient Measurements: Height: 4\' 10"  (147.3 cm) Weight: 130 lb (59 kg) IBW/kg (Calculated) : 40.9 Heparin Dosing Weight: 59 kg  Vital Signs: Temp: 98.2 F (36.8 C) (12/18 1525) Temp Source: Oral (12/18 1525) BP: 107/43 (12/18 1525) Pulse Rate: 73 (12/18 1525)  Labs: Recent Labs    06/01/17 1739  06/01/17 1924 06/01/17 2219 06/02/17 0437 06/02/17 1031 06/02/17 1529  HGB 13.3  --   --   --  11.3*  --   --   HCT 40.0  --   --   --  33.9*  --   --   PLT 234  --   --   --  178  --   --   APTT 40*  --   --   --   --   --   --   LABPROT 14.2  --   --   --   --   --   --   INR 1.11  --   --   --   --   --   --   HEPARINUNFRC  --   --   --   --  0.28*  --  0.19*  CREATININE  --   --  1.48*  --   --   --   --   TROPONINI  --    < > 0.14* 0.26* 0.22* 0.18*  --    < > = values in this interval not displayed.    Estimated Creatinine Clearance: 22.3 mL/min (A) (by C-G formula based on SCr of 1.48 mg/dL (H)).   Medical History: Past Medical History:  Diagnosis Date  . CHF (congestive heart failure) ([place])   . Diabetes mellitus without complication ([place]) 40/98/1191   Currently no high BS.  Has come off meds.  But having episodes of low BS now.  . Dilated idiopathic  cardiomyopathy ([place])   . GERD (gastroesophageal reflux disease)   . Hyperlipidemia   . Hypertension   . Hypothyroidism   . Renal disorder     Medications:  Scheduled:  . aspirin  324 mg Oral NOW   Or  . aspirin  300 mg Rectal NOW  . aspirin EC  81 mg Oral Daily  . atorvastatin  80 mg Oral q1800  . carvedilol  12.5 mg Oral BID WC  . fenofibrate  160 mg Oral Daily  . furosemide  40 mg Intravenous BID  . heparin  1,750 Units Intravenous Once  . insulin aspart  0-15 Units Subcutaneous TID WC  . [START ON 06/03/2017] levofloxacin  750 mg Oral Q48H  . levothyroxine  25 mcg Oral QAC breakfast  . meclizine  12.5 mg Oral BID  . nitroGLYCERIN  0.5 inch Topical Q8H  . pantoprazole  40 mg  Oral Daily  . potassium chloride  10 mEq Oral Daily  . ramipril  20 mg Oral Daily  . sertraline  50 mg Oral Daily    Assessment: Pharmacy consulted for heparin drip management for 83 yo female being medically managed for next 24-48 hours. Patient currently receiving heparin at 700 units/hr.   Goal of Therapy:  Heparin level 0.3-0.7 units/ml Monitor platelets by anticoagulation protocol: Yes   Plan:  Will bolus heparin 1750mg  IV x1 and increase rate to 900 units/hr. Will obtain follow up anti-Xa level with at 0100.   Pharmacy will continue to monitor and adjust per consult.   MLS 06/02/2017 1643

## 2017-06-02 NOTE — Discharge Instructions (Signed)
Heart Failure Clinic appointment on June 15 2017 at 2:00pm with Darylene Price, [place]. Please call [phone] to reschedule.

## 2017-06-02 NOTE — Progress Notes (Signed)
1 out of 4 blood cultures with gram-positive cocci, could be contaminant. BCID with staph species, methicillin-resistant. Patient on Levaquin. We'll hold off on changing antibiotics as she does not look septic. Monitor further sensitivities and culture results

## 2017-06-02 NOTE — Progress Notes (Signed)
Report called to [place], RN on 2A. Patient transported to room 247 with O2 @ 2 lpm via hospital bed by Chasity B., NT.

## 2017-06-02 NOTE — Progress Notes (Signed)
Inpatient Diabetes Program Recommendations  AACE/ADA: New Consensus Statement on Inpatient Glycemic Control (2015)  Target Ranges:  Prepandial:   less than 140 mg/dL      Peak postprandial:   less than 180 mg/dL (1-2 hours)      Critically ill patients:  140 - 180 mg/dL   Lab Results  Component Value Date   GLUCAP 92 06/02/2017   Review of Glycemic Control  Diabetes history: DM 2 Outpatient Diabetes medications: None Current orders for Inpatient glycemic control: Novolog Moderate 0-15 units tid  Inpatient Diabetes Program Recommendations:    Due to renal function reduce Novolog Correction scale to Novolog Sensitive Correction 0-9 units tid.  Thanks,  Tama Headings RN, MSN, [facility] Inpatient Diabetes Coordinator Team Pager [phone] (8a-5p)

## 2017-06-03 LAB — CBC
HCT: 33.5 % — ABNORMAL LOW (ref 35.0–47.0)
Hemoglobin: 11.3 g/dL — ABNORMAL LOW (ref 12.0–16.0)
MCH: 31 pg (ref 26.0–34.0)
MCHC: 33.7 g/dL (ref 32.0–36.0)
MCV: 92 fL (ref 80.0–100.0)
PLATELETS: 173 10*3/uL (ref 150–440)
RBC: 3.65 MIL/uL — ABNORMAL LOW (ref 3.80–5.20)
RDW: 12.9 % (ref 11.5–14.5)
WBC: 6.2 10*3/uL (ref 3.6–11.0)

## 2017-06-03 LAB — BASIC METABOLIC PANEL
ANION GAP: 8 (ref 5–15)
BUN: 39 mg/dL — ABNORMAL HIGH (ref 6–20)
CALCIUM: 8.7 mg/dL — AB (ref 8.9–10.3)
CO2: 24 mmol/L (ref 22–32)
CREATININE: 1.2 mg/dL — AB (ref 0.44–1.00)
Chloride: 102 mmol/L (ref 101–111)
GFR, EST AFRICAN AMERICAN: 47 mL/min — AB (ref >60)
GFR, EST NON AFRICAN AMERICAN: 41 mL/min — AB (ref >60)
GLUCOSE: 122 mg/dL — AB (ref 65–99)
Potassium: 3.6 mmol/L (ref 3.5–5.1)
Sodium: 134 mmol/L — ABNORMAL LOW (ref 135–145)

## 2017-06-03 LAB — BLOOD GAS, VENOUS
Acid-base deficit: 2.8 mmol/L — ABNORMAL HIGH (ref 0.0–2.0)
BICARBONATE: 24.8 mmol/L (ref 20.0–28.0)
PATIENT TEMPERATURE: 37
PCO2 VEN: 54 mmHg (ref 44.0–60.0)
pH, Ven: 7.27 (ref 7.250–7.430)

## 2017-06-03 LAB — GLUCOSE, CAPILLARY
GLUCOSE-CAPILLARY: 110 mg/dL — AB (ref 65–99)
GLUCOSE-CAPILLARY: 93 mg/dL (ref 65–99)
Glucose-Capillary: 109 mg/dL — ABNORMAL HIGH (ref 65–99)
Glucose-Capillary: 175 mg/dL — ABNORMAL HIGH (ref 65–99)

## 2017-06-03 LAB — ECHOCARDIOGRAM COMPLETE
HEIGHTINCHES: 58 in
WEIGHTICAEL: 2080 [oz_av]

## 2017-06-03 LAB — HEMOGLOBIN A1C
HEMOGLOBIN A1C: 6 % — AB (ref 4.8–5.6)
MEAN PLASMA GLUCOSE: 126 mg/dL

## 2017-06-03 LAB — HEPARIN LEVEL (UNFRACTIONATED): HEPARIN UNFRACTIONATED: 0.36 [IU]/mL (ref 0.30–0.70)

## 2017-06-03 MED ORDER — ISOSORBIDE MONONITRATE ER 60 MG PO TB24
60.0000 mg | ORAL_TABLET | Freq: Every day | ORAL | Status: DC
  Administered 2017-06-03 – 2017-06-04 (×2): 60 mg via ORAL
  Filled 2017-06-03 (×2): qty 1

## 2017-06-03 MED ORDER — DIPHENHYDRAMINE HCL 25 MG PO CAPS
25.0000 mg | ORAL_CAPSULE | Freq: Four times a day (QID) | ORAL | Status: DC | PRN
  Administered 2017-06-03: 25 mg via ORAL
  Filled 2017-06-03: qty 1

## 2017-06-03 MED ORDER — HEPARIN SODIUM (PORCINE) 5000 UNIT/ML IJ SOLN
5000.0000 [IU] | Freq: Three times a day (TID) | INTRAMUSCULAR | Status: DC
  Administered 2017-06-03 – 2017-06-04 (×4): 5000 [IU] via SUBCUTANEOUS
  Filled 2017-06-03 (×4): qty 1

## 2017-06-03 NOTE — Plan of Care (Signed)
Pt is A&Ox4. VSS. 2L O2 [place] continued . NSR on monitor. OOB with one person assist to [facility]. No complaints thus far. Will continue to monitor and report to oncoming RN . Progressing Education: Knowledge of General Education information will improve 06/03/2017 2259 - Progressing by Aleen Campi, RN Health Behavior/Discharge Planning: Ability to manage health-related needs will improve 06/03/2017 2259 - Progressing by Aleen Campi, RN Clinical Measurements: Ability to maintain clinical measurements within normal limits will improve 06/03/2017 2259 - Progressing by Aleen Campi, RN Will remain free from infection 06/03/2017 2259 - Progressing by Aleen Campi, RN Diagnostic test results will improve 06/03/2017 2259 - Progressing by Aleen Campi, RN Respiratory complications will improve 06/03/2017 2259 - Progressing by Aleen Campi, RN Cardiovascular complication will be avoided 06/03/2017 2259 - Progressing by Aleen Campi, RN Activity: Risk for activity intolerance will decrease 06/03/2017 2259 - Progressing by Aleen Campi, RN Nutrition: Adequate nutrition will be maintained 06/03/2017 2259 - Progressing by Aleen Campi, RN Coping: Level of anxiety will decrease 06/03/2017 2259 - Progressing by Aleen Campi, RN Elimination: Will not experience complications related to bowel motility 06/03/2017 2259 - Progressing by Aleen Campi, RN Will not experience complications related to urinary retention 06/03/2017 2259 - Progressing by Aleen Campi, RN Pain Managment: General experience of comfort will improve 06/03/2017 2259 - Progressing by Aleen Campi, RN Safety: Ability to remain free from injury will improve 06/03/2017 2259 - Progressing by Aleen Campi, RN Skin Integrity: Risk for impaired skin integrity will decrease 06/03/2017 2259 - Progressing by Aleen Campi, RN Education: Ability to demonstrate management of disease process will improve 06/03/2017 2259 -  Progressing by Aleen Campi, RN Ability to verbalize understanding of medication therapies will improve 06/03/2017 2259 - Progressing by Aleen Campi, RN Activity: Capacity to carry out activities will improve 06/03/2017 2259 - Progressing by Aleen Campi, RN Cardiac: Ability to achieve and maintain adequate cardiopulmonary perfusion will improve 06/03/2017 2259 - Progressing by Aleen Campi, RN

## 2017-06-03 NOTE — Progress Notes (Signed)
Patient walked to the BR without oxygen.  O2 sats after returning to bed = 85 %.  Oxygen 2L [place] resumed.

## 2017-06-03 NOTE — Progress Notes (Signed)
SATURATION QUALIFICATIONS: (This note is used to comply with regulatory documentation for home oxygen)  Patient Saturations on Room Air at Rest = 99%  Patient Saturations on Room Air while Ambulating = 86%  Patient Saturations on  Liters of oxygen while Ambulating = %  Please briefly explain why patient needs home oxygen:

## 2017-06-03 NOTE — Progress Notes (Signed)
PT Cancellation Note  Patient Details Name: Jacqueline [name] MRN: [id number] DOB: 01/13/1933   Cancelled Treatment:    Reason Eval/Treat Not Completed: Patient declined, no reason specified (Consult received and chart reviewed.  Patient refusing participation with therapy session, states she's "not feeling well" and just received medication because she may be allergic to a medication (benadryl).  Expresses need to empty bladder, but refuses OOB to [facility]; insistent on bedpan despite encouragement from therapist.)  Of note, patient resting sats 86% on RA, requiring application of supplemental O2 (2L) via [place] for recovery to >90%. RN informed/aware.  Will continue efforts for evaluation next date; will try to attempt when daughter present to assist/encourage as needed.   [name], PT, DPT, NCS 06/03/17, 3:07 PM [phone]

## 2017-06-03 NOTE — Progress Notes (Signed)
[facility] Cardiology  SUBJECTIVE: Patient laying comfortably in bed, been up to bathroom without difficulty, has not ambulated in the halls yet   Vitals:   06/02/17 2030 06/03/17 0500 06/03/17 0517 06/03/17 0803  BP: 135/63  (!) 134/56 (!) 145/52  Pulse: 79  77 80  Resp: 18   18  Temp: 98 F (36.7 C)  98 F (36.7 C) (!) 97.3 F (36.3 C)  TempSrc: Oral  Oral Oral  SpO2: 98%  94% 95%  Weight:  59.7 kg (131 lb 9.6 oz)    Height:         Intake/Output Summary (Last 24 hours) at 06/03/2017 1303 Last data filed at 06/03/2017 1034 Gross per 24 hour  Intake 820.7 ml  Output 500 ml  Net 320.7 ml      PHYSICAL EXAM  General: Well developed, well nourished, in no acute distress HEENT:  Normocephalic and atramatic Neck:  No JVD.  Lungs: Clear bilaterally to auscultation and percussion. Heart: HRRR . Normal S1 and S2 without gallops or murmurs.  Abdomen: Bowel sounds are positive, abdomen soft and non-tender  Msk:  Back normal, normal gait. Normal strength and tone for age. Extremities: No clubbing, cyanosis or edema.   Neuro: Alert and oriented X 3. Psych:  Good affect, responds appropriately   LABS: Basic Metabolic Panel: Recent Labs    06/01/17 1924 06/03/17 0638  NA 130* 134*  K 4.8 3.6  CL 97* 102  CO2 25 24  GLUCOSE 260* 122*  BUN 51* 39*  CREATININE 1.48* 1.20*  CALCIUM 8.7* 8.7*   Liver Function Tests: Recent Labs    06/01/17 1924  AST 33  ALT 13*  ALKPHOS 48  BILITOT 1.4*  PROT 7.0  ALBUMIN 3.7   No results for input(s): LIPASE, AMYLASE in the last 72 hours. CBC: Recent Labs    06/01/17 1739 06/02/17 0437 06/03/17 0638  WBC 18.9* 11.1* 6.2  NEUTROABS 16.5*  --   --   HGB 13.3 11.3* 11.3*  HCT 40.0 33.9* 33.5*  MCV 93.3 91.5 92.0  PLT 234 178 173   Cardiac Enzymes: Recent Labs    06/01/17 2219 06/02/17 0437 06/02/17 1031  TROPONINI 0.26* 0.22* 0.18*   BNP: Invalid input(s): POCBNP D-Dimer: No results for input(s): DDIMER in the last  72 hours. Hemoglobin A1C: Recent Labs    06/01/17 1924  HGBA1C 6.0*   Fasting Lipid Panel: No results for input(s): CHOL, HDL, LDLCALC, TRIG, CHOLHDL, LDLDIRECT in the last 72 hours. Thyroid Function Tests: Recent Labs    06/01/17 1924  TSH 2.629   Anemia Panel: No results for input(s): VITAMINB12, FOLATE, FERRITIN, TIBC, IRON, RETICCTPCT in the last 72 hours.  Ct Angio Chest Pe W And/or Wo Contrast  Result Date: 06/01/2017 CLINICAL DATA:  Short of breath EXAM: CT ANGIOGRAPHY CHEST WITH CONTRAST TECHNIQUE: Multidetector CT imaging of the chest was performed using the standard protocol during bolus administration of intravenous contrast. Multiplanar CT image reconstructions and MIPs were obtained to evaluate the vascular anatomy. CONTRAST:  69mL ISOVUE-370 IOPAMIDOL (ISOVUE-370) INJECTION 76% COMPARISON:  Chest 06/01/2017 FINDINGS: Cardiovascular: Negative for pulmonary embolism. Pulmonary artery enlargement bilaterally compatible with pulmonary artery hypertension Extensive atherosclerotic calcification aortic arch and coronary artery. Heart size within normal limits. No pericardial effusion Mediastinum/Nodes: Negative for mass or adenopathy Lungs/Pleura: Scarring right medial upper lobe. Chronic lung disease. Negative for infiltrate or effusion. Partially calcified nodule right lung base unchanged from prior PET-CT 03/13/2017 Upper Abdomen: Cirrhosis of the liver. No ascites. Spleen  not enlarged Musculoskeletal: Negative Review of the MIP images confirms the above findings. IMPRESSION: Negative for pulmonary embolism.  Pulmonary artery hypertension Diffuse atherosclerotic disease including the coronary artery. Cirrhosis Aortic Atherosclerosis (ICD10-I70.0). Electronically Signed   By: Franchot Gallo M.D.   On: 06/01/2017 21:02   Portable Chest X-ray 1 View  Result Date: 06/02/2017 CLINICAL DATA:  CHF. EXAM: PORTABLE CHEST 1 VIEW COMPARISON:  06/01/2017 chest radiograph and CTA FINDINGS:  The cardiac silhouette is borderline enlarged. Aortic atherosclerosis is noted. There is central pulmonary artery enlargement as described on CT. Slight interstitial prominence is similar to the prior study without overt alveolar edema. No sizable pleural effusion or pneumothorax is seen. A sclerotic lesion is again partially visualized in the proximal left humerus, benign in appearance. IMPRESSION: Mild pulmonary vascular congestion without overt edema. Electronically Signed   By: Logan Bores M.D.   On: 06/02/2017 09:16   Dg Chest Port 1 View  Result Date: 06/01/2017 CLINICAL DATA:  Short of breath EXAM: PORTABLE CHEST 1 VIEW COMPARISON:  03/02/2013 FINDINGS: Cardiac enlargement with mild vascular congestion. Negative for edema or effusion. Mild bibasilar atelectasis. Sclerotic benign appearing lesion proximal left humerus unchanged IMPRESSION: Mild bibasilar atelectasis. Pulmonary vascular congestion Atherosclerotic aorta Electronically Signed   By: Franchot Gallo M.D.   On: 06/01/2017 18:39     Echo mildly dilated LV with LV ejection fraction 30-35%, with mild to moderate mitral and tricuspid regurgitation  TELEMETRY: Normal sinus rhythm:  ASSESSMENT AND PLAN:  Active Problems:   MI, acute, non ST segment elevation (HCC)    1.  Acute on chronic systolic congestive heart failure, improved after diuresis 2.  Borderline elevated troponin, 0.14, 0.26, 0.22, 0.18, without definite peak or trough or trend, suspect multifactorial, likely due to demand supply ischemia 3.  Known dilated cardiomyopathy, LVEF 30-35%, likely idiopathic  Recommendations  1.  DC heparin 2.  Continue diuresis 3.  Carefully monitor renal status 4.  Convert topical nitrates to isosorbide mononitrate 5.  Continue conservative management, defer cardiac catheterization at this time and patient with known dilated cardiomyopathy, acute on chronic systolic congestive heart failure, borderline elevated troponin, atypical  chest pain.  Discussed plan in detail with patient and patient's daughter who agree with current course of action.  Gradually increase activity, hopefully begin ambulation with assistance.   Isaias Cowman, MD, PhD, [facility] 06/03/2017 1:03 PM

## 2017-06-03 NOTE — Plan of Care (Signed)
Pt A&Ox4. VSS. 2L O2 [place] continued. Pt OOB to [facility] one assist. Heparin gtt continued per order. No complaints thus far. Will continue to monitor and report to oncoming RN .  Progressing Education: Knowledge of General Education information will improve 06/03/2017 0033 - Progressing by Aleen Campi, RN Health Behavior/Discharge Planning: Ability to manage health-related needs will improve 06/03/2017 0033 - Progressing by Aleen Campi, RN Clinical Measurements: Ability to maintain clinical measurements within normal limits will improve 06/03/2017 0033 - Progressing by Aleen Campi, RN Will remain free from infection 06/03/2017 0033 - Progressing by Aleen Campi, RN Diagnostic test results will improve 06/03/2017 0033 - Progressing by Aleen Campi, RN Respiratory complications will improve 06/03/2017 0033 - Progressing by Aleen Campi, RN Cardiovascular complication will be avoided 06/03/2017 0033 - Progressing by Aleen Campi, RN Activity: Risk for activity intolerance will decrease 06/03/2017 0033 - Progressing by Aleen Campi, RN Nutrition: Adequate nutrition will be maintained 06/03/2017 0033 - Progressing by Aleen Campi, RN Coping: Level of anxiety will decrease 06/03/2017 0033 - Progressing by Aleen Campi, RN Elimination: Will not experience complications related to bowel motility 06/03/2017 0033 - Progressing by Aleen Campi, RN Will not experience complications related to urinary retention 06/03/2017 0033 - Progressing by Aleen Campi, RN Pain Managment: General experience of comfort will improve 06/03/2017 0033 - Progressing by Aleen Campi, RN Safety: Ability to remain free from injury will improve 06/03/2017 0033 - Progressing by Aleen Campi, RN Skin Integrity: Risk for impaired skin integrity will decrease 06/03/2017 0033 - Progressing by Aleen Campi, RN Education: Ability to demonstrate management of disease process will improve 06/03/2017 0033  - Progressing by Aleen Campi, RN Ability to verbalize understanding of medication therapies will improve 06/03/2017 0033 - Progressing by Aleen Campi, RN Activity: Capacity to carry out activities will improve 06/03/2017 0033 - Progressing by Aleen Campi, RN Cardiac: Ability to achieve and maintain adequate cardiopulmonary perfusion will improve 06/03/2017 0033 - Progressing by Aleen Campi, RN

## 2017-06-03 NOTE — Progress Notes (Signed)
Patient c/o facial swelling.  Noticeable but not severe.  VS stable.  Benadry 25 mg po given

## 2017-06-03 NOTE — Progress Notes (Signed)
[place] for heparin Indication: chest pain/ACS  Allergies  Allergen Reactions  . Ciprofloxacin   . Digoxin And Related   . Erythromycin   . Lopid [Gemfibrozil]   . Nitrofurantoin   . Cefuroxime Rash  . Penicillins Rash    Has patient had a PCN reaction causing immediate rash, facial/tongue/throat swelling, SOB or lightheadedness with hypotension: Unknown Has patient had a PCN reaction causing severe rash involving mucus membranes or skin necrosis: No Has patient had a PCN reaction that required hospitalization: No Has patient had a PCN reaction occurring within the last 10 years: No If all of the above answers are "NO", then may proceed with Cephalosporin use.     Patient Measurements: Height: 4\' 10"  (147.3 cm) Weight: 131 lb 9.6 oz (59.7 kg) IBW/kg (Calculated) : 40.9 Heparin Dosing Weight: 59 kg  Vital Signs: Temp: 98 F (36.7 C) (12/19 0517) Temp Source: Oral (12/19 0517) BP: 134/56 (12/19 0517) Pulse Rate: 77 (12/19 0517)  Labs: Recent Labs    06/01/17 1739  06/01/17 1924 06/01/17 2219 06/02/17 0437 06/02/17 1031 06/02/17 1529 06/03/17 0058  HGB 13.3  --   --   --  11.3*  --   --   --   HCT 40.0  --   --   --  33.9*  --   --   --   PLT 234  --   --   --  178  --   --   --   APTT 40*  --   --   --   --   --   --   --   LABPROT 14.2  --   --   --   --   --   --   --   INR 1.11  --   --   --   --   --   --   --   HEPARINUNFRC  --   --   --   --  0.28*  --  0.19* 0.36  CREATININE  --   --  1.48*  --   --   --   --   --   TROPONINI  --    < > 0.14* 0.26* 0.22* 0.18*  --   --    < > = values in this interval not displayed.    Estimated Creatinine Clearance: 22.4 mL/min (A) (by C-G formula based on SCr of 1.48 mg/dL (H)).   Medical History: Past Medical History:  Diagnosis Date  . CHF (congestive heart failure) ([place])   . Diabetes mellitus without complication ([place]) 12/14/1599   Currently no high BS.  Has come off  meds.  But having episodes of low BS now.  . Dilated idiopathic cardiomyopathy ([place])   . GERD (gastroesophageal reflux disease)   . Hyperlipidemia   . Hypertension   . Hypothyroidism   . Renal disorder     Medications:  Scheduled:  . aspirin EC  81 mg Oral Daily  . atorvastatin  80 mg Oral q1800  . carvedilol  12.5 mg Oral BID WC  . fenofibrate  160 mg Oral Daily  . furosemide  40 mg Intravenous BID  . insulin aspart  0-15 Units Subcutaneous TID WC  . levofloxacin  750 mg Oral Q48H  . levothyroxine  25 mcg Oral QAC breakfast  . meclizine  12.5 mg Oral BID  . nitroGLYCERIN  0.5 inch Topical Q8H  . pantoprazole  40 mg Oral  Daily  . potassium chloride  10 mEq Oral Daily  . ramipril  20 mg Oral Daily  . sertraline  50 mg Oral Daily  . sodium chloride flush  3 mL Intravenous Q12H    Assessment: Pharmacy consulted for heparin drip management for 83 yo female being medically managed for next 24-48 hours. Patient currently receiving heparin at 900 units/hr.   Goal of Therapy:  Heparin level 0.3-0.7 units/ml Monitor platelets by anticoagulation protocol: Yes   Plan:  Continue rate to 900 units/hr. Will obtain follow up anti-Xa level with at 0900.   Pharmacy will continue to monitor and adjust per consult.   MLS 06/03/2017

## 2017-06-03 NOTE — Progress Notes (Signed)
[facility] [place] NAME: Jacqueline [name]    MR#:  [id number]  DATE OF BIRTH:  1933/05/24  SUBJECTIVE:   Patient here due to shortness of breath secondary to congestive heart failure with underlying dilated cardiomyopathy. Also noted to have urinary tract infection. Patient says she does not feel much better today. He has not ambulated with physical therapy. Afebrile, no worsening shortness of breath. Noted to be hypoxic on minimal exertion though.  REVIEW OF SYSTEMS:    Review of Systems  Constitutional: Negative for chills and fever.  HENT: Negative for congestion and tinnitus.   Eyes: Negative for blurred vision and double vision.  Respiratory: Positive for shortness of breath. Negative for cough and wheezing.   Cardiovascular: Negative for chest pain, orthopnea and PND.  Gastrointestinal: Negative for abdominal pain, diarrhea, nausea and vomiting.  Genitourinary: Negative for dysuria and hematuria.  Neurological: Negative for dizziness, sensory change and focal weakness.  All other systems reviewed and are negative.   Nutrition: Heart Healthy Tolerating Diet: Yes Tolerating PT: Await Eval.   DRUG ALLERGIES:   Allergies  Allergen Reactions  . Ciprofloxacin   . Digoxin And Related   . Erythromycin   . Lopid [Gemfibrozil]   . Nitrofurantoin   . Cefuroxime Rash  . Penicillins Rash    Has patient had a PCN reaction causing immediate rash, facial/tongue/throat swelling, SOB or lightheadedness with hypotension: Unknown Has patient had a PCN reaction causing severe rash involving mucus membranes or skin necrosis: No Has patient had a PCN reaction that required hospitalization: No Has patient had a PCN reaction occurring within the last 10 years: No If all of the above answers are "NO", then may proceed with Cephalosporin use.     VITALS:  Blood pressure (!) 147/58, pulse 73, temperature (!) 97.5 F (36.4 C), temperature source Oral,  resp. rate 18, height 4\' 10"  (1.473 m), weight 59.7 kg (131 lb 9.6 oz), SpO2 100 %.  PHYSICAL EXAMINATION:   Physical Exam  GENERAL:  83 y.o.-year-old patient lying in bed in no acute distress.  EYES: Pupils equal, round, reactive to light and accommodation. No scleral icterus. Extraocular muscles intact.  HEENT: Head atraumatic, normocephalic. Oropharynx and nasopharynx clear.  NECK:  Supple, no jugular venous distention. No thyroid enlargement, no tenderness.  LUNGS: Normal breath sounds bilaterally, no wheezing, bibasliar rales, rhonchi. No use of accessory muscles of respiration.  CARDIOVASCULAR: S1, S2 normal. No murmurs, rubs, or gallops.  ABDOMEN: Soft, nontender, nondistended. Bowel sounds present. No organomegaly or mass.  EXTREMITIES: No cyanosis, clubbing or +1-2 edema b/l.    NEUROLOGIC: Cranial nerves II through XII are intact. No focal Motor or sensory deficits b/l.   PSYCHIATRIC: The patient is alert and oriented x 3.  SKIN: No obvious rash, lesion, or ulcer.    LABORATORY PANEL:   CBC Recent Labs  Lab 06/03/17 0638  WBC 6.2  HGB 11.3*  HCT 33.5*  PLT 173   ------------------------------------------------------------------------------------------------------------------  Chemistries  Recent Labs  Lab 06/01/17 1924 06/03/17 0638  NA 130* 134*  K 4.8 3.6  CL 97* 102  CO2 25 24  GLUCOSE 260* 122*  BUN 51* 39*  CREATININE 1.48* 1.20*  CALCIUM 8.7* 8.7*  AST 33  --   ALT 13*  --   ALKPHOS 48  --   BILITOT 1.4*  --    ------------------------------------------------------------------------------------------------------------------  Cardiac Enzymes Recent Labs  Lab 06/02/17 1031  TROPONINI 0.18*   ------------------------------------------------------------------------------------------------------------------  RADIOLOGY:  Ct Angio Chest Pe W And/or Wo Contrast  Result Date: 06/01/2017 CLINICAL DATA:  Short of breath EXAM: CT ANGIOGRAPHY CHEST  WITH CONTRAST TECHNIQUE: Multidetector CT imaging of the chest was performed using the standard protocol during bolus administration of intravenous contrast. Multiplanar CT image reconstructions and MIPs were obtained to evaluate the vascular anatomy. CONTRAST:  59mL ISOVUE-370 IOPAMIDOL (ISOVUE-370) INJECTION 76% COMPARISON:  Chest 06/01/2017 FINDINGS: Cardiovascular: Negative for pulmonary embolism. Pulmonary artery enlargement bilaterally compatible with pulmonary artery hypertension Extensive atherosclerotic calcification aortic arch and coronary artery. Heart size within normal limits. No pericardial effusion Mediastinum/Nodes: Negative for mass or adenopathy Lungs/Pleura: Scarring right medial upper lobe. Chronic lung disease. Negative for infiltrate or effusion. Partially calcified nodule right lung base unchanged from prior PET-CT 03/13/2017 Upper Abdomen: Cirrhosis of the liver. No ascites. Spleen not enlarged Musculoskeletal: Negative Review of the MIP images confirms the above findings. IMPRESSION: Negative for pulmonary embolism.  Pulmonary artery hypertension Diffuse atherosclerotic disease including the coronary artery. Cirrhosis Aortic Atherosclerosis (ICD10-I70.0). Electronically Signed   By: Franchot Gallo M.D.   On: 06/01/2017 21:02   Portable Chest X-ray 1 View  Result Date: 06/02/2017 CLINICAL DATA:  CHF. EXAM: PORTABLE CHEST 1 VIEW COMPARISON:  06/01/2017 chest radiograph and CTA FINDINGS: The cardiac silhouette is borderline enlarged. Aortic atherosclerosis is noted. There is central pulmonary artery enlargement as described on CT. Slight interstitial prominence is similar to the prior study without overt alveolar edema. No sizable pleural effusion or pneumothorax is seen. A sclerotic lesion is again partially visualized in the proximal left humerus, benign in appearance. IMPRESSION: Mild pulmonary vascular congestion without overt edema. Electronically Signed   By: Logan Bores M.D.   On:  06/02/2017 09:16   Dg Chest Port 1 View  Result Date: 06/01/2017 CLINICAL DATA:  Short of breath EXAM: PORTABLE CHEST 1 VIEW COMPARISON:  03/02/2013 FINDINGS: Cardiac enlargement with mild vascular congestion. Negative for edema or effusion. Mild bibasilar atelectasis. Sclerotic benign appearing lesion proximal left humerus unchanged IMPRESSION: Mild bibasilar atelectasis. Pulmonary vascular congestion Atherosclerotic aorta Electronically Signed   By: Franchot Gallo M.D.   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  1. CHF-acute on chronic systolic dysfunction. -This is the cause of patient's worsening shortness of breath. Patient has underlying severe cardiomyopathy with ejection fraction of 30%. Unclear this is ischemic or nonischemic. -Continue diuresis with IV Lasix, follow I's and O's and daily weights. Continue carvedilol, Ramipril. -Appreciate cardiology input and continue current care.  2. Elevated troponin-?? Non-ST elevation MI versus demand ischemia. -Patient does have a severe dilated cardiomyopathy and it's unclear whether it ischemic or nonischemic. -Troponins did not trending upwards. Seen by cardiology, repeat echocardiogram no acute wall motion abnormalities with severely reduced EF of 30-35%. No plans for ischemic evaluation presently is this is chronic. Heparin has been discontinued, continue aspirin, statin, carvedilol for now.  3. Urinary tract infection-continue IV Levaquin -Urine cultures growing 100,000 colonies of Escherichia coli but sensitivities pending.  4. History of dilated cardiomyopathy-unclear but this is ischemic and nonischemic ischemic. -Further workup as an outpatient and follow-up with cardiology.  5.  Chronic kidney disease stage III to 4-creatinine close to baseline and will continue to monitor with IV diuresis.  6. Hypothyroidism-continue Synthroid.   7. Depression-continue Zoloft.  8. Facial swelling - informed by nurse that patient had some facial swelling. She has no tongue swelling, evidence of anaphylaxis or hypoxia. Unclear what she is allergic to presently. Given some IV Benadryl.  Await physical therapy evaluation.  All the records are reviewed and case discussed with Care Management/Social Worker. Management plans discussed with the patient, family and they are in agreement.  CODE STATUS: DO NOT RESUSCITATE  DVT Prophylaxis: Heparin SQ  TOTAL TIME TAKING CARE OF THIS PATIENT: 30 minutes.   POSSIBLE D/C IN 1-2 DAYS, DEPENDING ON CLINICAL CONDITION.   Henreitta Leber M.D on 06/03/2017 at 4:24 PM  Between 7am to 6pm - Pager - [phone]  After 6pm go to [email or web address] - Proofreader  [facility]  Office  [phone]  CC: Primary care physician; Tracie Harrier, MD

## 2017-06-04 LAB — CBC
HCT: 34.1 % — ABNORMAL LOW (ref 35.0–47.0)
Hemoglobin: 11.4 g/dL — ABNORMAL LOW (ref 12.0–16.0)
MCH: 31 pg (ref 26.0–34.0)
MCHC: 33.5 g/dL (ref 32.0–36.0)
MCV: 92.4 fL (ref 80.0–100.0)
PLATELETS: 177 10*3/uL (ref 150–440)
RBC: 3.69 MIL/uL — ABNORMAL LOW (ref 3.80–5.20)
RDW: 13.2 % (ref 11.5–14.5)
WBC: 4.8 10*3/uL (ref 3.6–11.0)

## 2017-06-04 LAB — URINE CULTURE: Culture: >=100000 — AB

## 2017-06-04 LAB — BASIC METABOLIC PANEL
Anion gap: 8 (ref 5–15)
BUN: 34 mg/dL — AB (ref 6–20)
CALCIUM: 9 mg/dL (ref 8.9–10.3)
CO2: 27 mmol/L (ref 22–32)
CREATININE: 1.28 mg/dL — AB (ref 0.44–1.00)
Chloride: 98 mmol/L — ABNORMAL LOW (ref 101–111)
GFR calc Af Amer: 44 mL/min — ABNORMAL LOW (ref >60)
GFR, EST NON AFRICAN AMERICAN: 38 mL/min — AB (ref >60)
GLUCOSE: 99 mg/dL (ref 65–99)
Potassium: 3.8 mmol/L (ref 3.5–5.1)
Sodium: 133 mmol/L — ABNORMAL LOW (ref 135–145)

## 2017-06-04 LAB — GLUCOSE, CAPILLARY
Glucose-Capillary: 109 mg/dL — ABNORMAL HIGH (ref 65–99)
Glucose-Capillary: 140 mg/dL — ABNORMAL HIGH (ref 65–99)

## 2017-06-04 MED ORDER — SULFAMETHOXAZOLE-TRIMETHOPRIM 800-160 MG PO TABS: 1.0000 | ORAL_TABLET | Freq: Two times a day (BID) | ORAL | 0 refills | Status: AC

## 2017-06-04 MED ORDER — LEVOFLOXACIN 750 MG PO TABS: 750.0000 mg | ORAL_TABLET | ORAL | 0 refills | Status: DC

## 2017-06-04 MED ORDER — ATORVASTATIN CALCIUM 40 MG PO TABS: 80.0000 mg | ORAL_TABLET | Freq: Every day | ORAL | 1 refills | Status: DC

## 2017-06-04 MED ORDER — ISOSORBIDE MONONITRATE ER 60 MG PO TB24: 30.0000 mg | ORAL_TABLET | Freq: Every day | ORAL | 1 refills | Status: DC

## 2017-06-04 NOTE — Progress Notes (Signed)
Patient given discharge instructions with family at bedside. IV out and tele off. Prescriptions given. Family and patient verbalize understanding with no further questions. Pt transported via wheelchair.

## 2017-06-04 NOTE — Care Management Important Message (Signed)
Important Message  Patient Details  Name: Jacqueline [name] MRN: [id number] Date of Birth: 05/27/1933   Medicare Important Message Given:  Yes    Beverly Sessions, RN 06/04/2017, 12:06 PM

## 2017-06-04 NOTE — Progress Notes (Signed)
[facility] Cardiology  SUBJECTIVE: Patient resting peacefully in bed   Vitals:   06/03/17 1636 06/03/17 1957 06/04/17 0356 06/04/17 0357  BP: (!) 150/62 (!) 103/43 133/60   Pulse: 81 66 72 75  Resp: 18 18    Temp: 98.1 F (36.7 C) 98.3 F (36.8 C)  98.3 F (36.8 C)  TempSrc: Oral Oral  Oral  SpO2: 98% 97%  98%  Weight:    58.1 kg (128 lb)  Height:         Intake/Output Summary (Last 24 hours) at 06/04/2017 0800 Last data filed at 06/04/2017 2683 Gross per 24 hour  Intake 600 ml  Output 1700 ml  Net -1100 ml      PHYSICAL EXAM  General: Well developed, well nourished, in no acute distress HEENT:  Normocephalic and atramatic Neck:  No JVD.  Lungs: Clear bilaterally to auscultation and percussion. Heart: HRRR . Normal S1 and S2 without gallops or murmurs.  Abdomen: Bowel sounds are positive, abdomen soft and non-tender  Msk:  Back normal, normal gait. Normal strength and tone for age. Extremities: No clubbing, cyanosis or edema.   Neuro: Alert and oriented X 3. Psych:  Good affect, responds appropriately   LABS: Basic Metabolic Panel: Recent Labs    06/03/17 0638 06/04/17 0330  NA 134* 133*  K 3.6 3.8  CL 102 98*  CO2 24 27  GLUCOSE 122* 99  BUN 39* 34*  CREATININE 1.20* 1.28*  CALCIUM 8.7* 9.0   Liver Function Tests: Recent Labs    06/01/17 1924  AST 33  ALT 13*  ALKPHOS 48  BILITOT 1.4*  PROT 7.0  ALBUMIN 3.7   No results for input(s): LIPASE, AMYLASE in the last 72 hours. CBC: Recent Labs    06/01/17 1739  06/03/17 0638 06/04/17 0330  WBC 18.9*   < > 6.2 4.8  NEUTROABS 16.5*  --   --   --   HGB 13.3   < > 11.3* 11.4*  HCT 40.0   < > 33.5* 34.1*  MCV 93.3   < > 92.0 92.4  PLT 234   < > 173 177   < > = values in this interval not displayed.   Cardiac Enzymes: Recent Labs    06/01/17 2219 06/02/17 0437 06/02/17 1031  TROPONINI 0.26* 0.22* 0.18*   BNP: Invalid input(s): POCBNP D-Dimer: No results for input(s): DDIMER in the last 72  hours. Hemoglobin A1C: Recent Labs    06/01/17 1924  HGBA1C 6.0*   Fasting Lipid Panel: No results for input(s): CHOL, HDL, LDLCALC, TRIG, CHOLHDL, LDLDIRECT in the last 72 hours. Thyroid Function Tests: Recent Labs    06/01/17 1924  TSH 2.629   Anemia Panel: No results for input(s): VITAMINB12, FOLATE, FERRITIN, TIBC, IRON, RETICCTPCT in the last 72 hours.  No results found.   Echo LV EF 30-35%  TELEMETRY: Normal sinus rhythm:  ASSESSMENT AND PLAN:  Active Problems:   MI, acute, non ST segment elevation (HCC)    1. Acute on chronic congestive heart failure, improved after diuresis 2. Borderline elevated troponin, with minimal peak or trough, likely due to demand supply ischemia in the setting of acute on chronic systolic congestive heart failure, not due to acute coronary syndrome 3. Known dilated current myopathy of unknown etiology, likely idiopathic, LVEF 30-35%  Recommendations  1. Current medications 2. Increase activity today, possible discharge later today or tomorrow am 3. Follow-up with Dr. Nehemiah Massed  Sign off for now, please call if any questions  Isaias Cowman, MD, PhD, [facility] 06/04/2017 8:00 AM

## 2017-06-04 NOTE — Evaluation (Signed)
Physical Therapy Evaluation Patient Details Name: Jacqueline [name] MRN: [id number] DOB: 01-31-1933 Today's Date: 06/04/2017   History of Present Illness  presented to ER secondary to SOB, generalized illness/weakness and cough; admitted with sepsis related to UTI.  Noted with elevated troponin (peak 0.26) and initial concern for MI, managed with heparin ([place] 12/19); attributed to demand ischemia per cardiology.  Plan for conservative management at this time.  Clinical Impression  Upon evaluation, patient alert and oriented to basic information; follows simple commands, but requires mild increased in time for processing and response.  Bilat UE/LE strength and ROM grossly WFL for basic transfers and mobility, but fatigues quickly with mobility efforts.  Demonstrates ability to complete bed mobility indep; sit/stand, basic transfers and gait (77') with RW, cga/min assist.  Very slow and deliberate, very broad turning radius (indicative of increased fall risk), but no overt buckling or LOB noted during session. Currently requiring 2L supplemental O2 to maintain sats with exertion; anticipate possible need for home O2 upon discharge. RN informed/aware. Would benefit from skilled PT to address above deficits and promote optimal return to PLOF; Recommend transition to HHPT and 24 hour sup/assist upon discharge from acute hospitalization.  SaO2 on room air at rest = 90% SaO2 on room air while ambulating = 86% SaO2 on 2 liters of O2 while ambulating = 95%     Follow Up Recommendations Home health PT;Supervision/Assistance - 24 hour    Equipment Recommendations  Rolling walker with 5" wheels    Recommendations for Other Services       Precautions / Restrictions Precautions Precautions: Fall Restrictions Weight Bearing Restrictions: No      Mobility  Bed Mobility Overal bed mobility: Needs Assistance Bed Mobility: Supine to Sit     Supine to sit: Supervision     General bed mobility  comments: heavy use of bedrails, increased time to complete  Transfers Overall transfer level: Needs assistance Equipment used: Rolling walker (2 wheeled) Transfers: Sit to/from Stand Sit to Stand: Min guard;Min assist         General transfer comment: pulls on RW despite cuing; increased time for full activation, extension of bilat hips/knees  Ambulation/Gait Ambulation/Gait assistance: Min guard;Min assist Ambulation Distance (Feet): 40 Feet Assistive device: Rolling walker (2 wheeled)     Gait velocity interpretation: Below normal speed for age/gender General Gait Details: decreased step height/length with short, shuffling steps; slow, guarded gait performance.  Broad turning radius (indicative of decreased balance, increased fall risk), but no overt buckling of LOB. Additional distance deferred by patient due to fatigue  Stairs            Wheelchair Mobility    Modified Rankin (Stroke Patients Only)       Balance Overall balance assessment: Needs assistance Sitting-balance support: No upper extremity supported;Feet supported Sitting balance-Leahy Scale: Good Sitting balance - Comments: sitting functional reach approx 6" with good trunk control, good awareness of limits of stability   Standing balance support: Bilateral upper extremity supported Standing balance-Leahy Scale: Fair Standing balance comment: minimal ability to move outside immediate BOS, requiring UE support at all times with standing activities                             Pertinent Vitals/Pain Pain Assessment: No/denies pain    Home Living Family/patient expects to be discharged to:: Private residence Living Arrangements: Children(son works outside of home) Available Help at Discharge: Family;Available PRN/intermittently;Personal care attendant(has caregiver  2 days/week to assist with ADLs, household chores as needed)   Home Access: Stairs to enter   [employer] of  Steps: 1 Home Layout: One level Home Equipment: [place] - 4 wheels;Wheelchair - power;Walker - 2 wheels      Prior Function Level of Independence: Independent with assistive device(s)         Comments: Mod indep for limited household mobilization using 4WRW; assist from caregiver for ADLs and household chores as needed.  Family assists with transportation and community needs.  No home O2.  Does endorse at least 3 falls in previous six months.     Hand Dominance        Extremity/Trunk Assessment   Upper Extremity Assessment Upper Extremity Assessment: Overall WFL for tasks assessed(grossly 4/5 throughout)    Lower Extremity Assessment Lower Extremity Assessment: Generalized weakness(grossly 4-/5 throughout; denies sensory deficit)       Communication   Communication: HOH  Cognition Arousal/Alertness: Awake/alert Behavior During Therapy: WFL for tasks assessed/performed Overall Cognitive Status: Within Functional Limits for tasks assessed                                 General Comments: mild delay in processing and response time      General Comments      Exercises Other Exercises Other Exercises: Sit/stand x3 with RW, cga/min assist-very slow activation/extension of bilat hips/knees; encouraged for hand placement, postural extension Other Exercises: Briefly discussed current need for O2 (and possible need for O2 in home environment), reviewed importance of cord management to maintain safety/prevent falls.  Patient/daughter voiced understanding, but will continue to reinforce and incorporate into subsequent sessions as needed.   Assessment/Plan    PT Assessment Patient needs continued PT services  PT Problem List Decreased strength;Decreased range of motion;Decreased activity tolerance;Decreased balance;Decreased mobility;Decreased coordination;Decreased knowledge of use of DME;Decreased safety awareness;Cardiopulmonary status limiting activity;Decreased  knowledge of precautions       PT Treatment Interventions DME instruction;Therapeutic exercise;Stair training;Gait training;Functional mobility training;Therapeutic activities;Balance training;Cognitive remediation;Patient/family education    PT Goals (Current goals can be found in the Care Plan section)  Acute Rehab PT Goals Patient Stated Goal: to return home PT Goal Formulation: With patient/family Time For Goal Achievement: 06/18/17 Potential to Achieve Goals: Fair    Frequency Min 2X/week   Barriers to discharge Decreased caregiver support      Co-evaluation               AM-PAC PT "6 Clicks" Daily Activity  Outcome Measure Difficulty turning over in bed (including adjusting bedclothes, sheets and blankets)?: A Little Difficulty moving from lying on back to sitting on the side of the bed? : A Little Difficulty sitting down on and standing up from a chair with arms (e.g., wheelchair, bedside commode, etc,.)?: Unable Help needed moving to and from a bed to chair (including a wheelchair)?: A Little Help needed walking in hospital room?: A Little Help needed climbing 3-5 steps with a railing? : A Little 6 Click Score: 16    End of Session Equipment Utilized During Treatment: Gait belt;Oxygen Activity Tolerance: Patient tolerated treatment well Patient left: in chair;with call bell/phone within reach;with chair alarm set Nurse Communication: Mobility status PT Visit Diagnosis: Muscle weakness (generalized) (M62.81);Difficulty in walking, not elsewhere classified (R26.2)    Time: 4008-6761 PT Time Calculation (min) (ACUTE ONLY): 25 min   Charges:   PT Evaluation $PT Eval Low Complexity: 1 Low  PT Treatments $Therapeutic Activity: 8-22 mins   PT G Codes:   PT G-Codes **NOT FOR INPATIENT CLASS** Functional Assessment Tool Used: AM-PAC 6 Clicks Basic Mobility Functional Limitation: Mobility: Walking and moving around Mobility: Walking and Moving Around Current  Status (D0518): At least 20 percent but less than 40 percent impaired, limited or restricted Mobility: Walking and Moving Around Goal Status [id number]): At least 1 percent but less than 20 percent impaired, limited or restricted    Denny Mccree H. Owens Shark, PT, DPT, NCS 06/04/17, 9:51 AM [phone]

## 2017-06-04 NOTE — Care Management (Signed)
Patient to discharge home today.  Patient admitted with CHF.  Patient lives at home with family.  Alone during the day, son is there at night.  Patient has a RW, and electric WC.  PCP Hande.  PT has assessed patient and recommends home health PT.  Patient has previously been open with [facility] and would like to use them again.  Referral made  To [facility] with [facility]. Family request RN and PT only.  Patient qualifies for new home O2. Referral made to [facility] with [employer].  Portable tank to be delivered prior to discharge.  Family to transport.  RNCM signing off.

## 2017-06-04 NOTE — Plan of Care (Signed)
  Adequate for Discharge Education: Knowledge of General Education information will improve 06/04/2017 1239 - Adequate for Discharge by Feliberto Gottron, Blanket Behavior/Discharge Planning: Ability to manage health-related needs will improve 06/04/2017 1239 - Adequate for Discharge by Feliberto Gottron, RN Clinical Measurements: Ability to maintain clinical measurements within normal limits will improve 06/04/2017 1239 - Adequate for Discharge by Chriss Czar, Illene Bolus, RN Will remain free from infection 06/04/2017 1239 - Adequate for Discharge by Feliberto Gottron, RN Diagnostic test results will improve 06/04/2017 1239 - Adequate for Discharge by Feliberto Gottron, RN Respiratory complications will improve 06/04/2017 1239 - Adequate for Discharge by Feliberto Gottron, RN Cardiovascular complication will be avoided 06/04/2017 1239 - Adequate for Discharge by Chriss Czar, Illene Bolus, RN Activity: Risk for activity intolerance will decrease 06/04/2017 1239 - Adequate for Discharge by Feliberto Gottron, RN Nutrition: Adequate nutrition will be maintained 06/04/2017 1239 - Adequate for Discharge by Feliberto Gottron, RN Coping: Level of anxiety will decrease 06/04/2017 1239 - Adequate for Discharge by Chriss Czar, Illene Bolus, RN Elimination: Will not experience complications related to bowel motility 06/04/2017 1239 - Adequate for Discharge by Chriss Czar, Illene Bolus, RN Will not experience complications related to urinary retention 06/04/2017 1239 - Adequate for Discharge by Feliberto Gottron, RN Pain Managment: General experience of comfort will improve 06/04/2017 1239 - Adequate for Discharge by Feliberto Gottron, RN Safety: Ability to remain free from injury will improve 06/04/2017 1239 - Adequate for Discharge by Chriss Czar, Illene Bolus, RN Skin Integrity: Risk for impaired skin integrity will  decrease 06/04/2017 1239 - Adequate for Discharge by Feliberto Gottron, RN Education: Ability to demonstrate management of disease process will improve 06/04/2017 1239 - Adequate for Discharge by Chriss Czar, Illene Bolus, RN Ability to verbalize understanding of medication therapies will improve 06/04/2017 1239 - Adequate for Discharge by Feliberto Gottron, RN Activity: Capacity to carry out activities will improve 06/04/2017 1239 - Adequate for Discharge by Feliberto Gottron, RN Cardiac: Ability to achieve and maintain adequate cardiopulmonary perfusion will improve 06/04/2017 1239 - Adequate for Discharge by Feliberto Gottron, RN

## 2017-06-05 ENCOUNTER — Emergency Department: Payer: Medicare Other

## 2017-06-05 ENCOUNTER — Emergency Department
Admission: EM | Admit: 2017-06-05 | Discharge: 2017-06-05 | Disposition: A | Discharge status: Home / Self Care | Payer: Medicare Other | Attending: Emergency Medicine | Admitting: Emergency Medicine

## 2017-06-05 ENCOUNTER — Other Ambulatory Visit: Payer: Self-pay

## 2017-06-05 DIAGNOSIS — I509 Heart failure, unspecified: Secondary | ICD-10-CM | POA: Diagnosis not present

## 2017-06-05 DIAGNOSIS — R079 Chest pain, unspecified: Secondary | ICD-10-CM | POA: Diagnosis present

## 2017-06-05 DIAGNOSIS — E039 Hypothyroidism, unspecified: Secondary | ICD-10-CM | POA: Diagnosis not present

## 2017-06-05 DIAGNOSIS — Z79899 Other long term (current) drug therapy: Secondary | ICD-10-CM | POA: Insufficient documentation

## 2017-06-05 DIAGNOSIS — Z7982 Long term (current) use of aspirin: Secondary | ICD-10-CM | POA: Insufficient documentation

## 2017-06-05 DIAGNOSIS — Z859 Personal history of malignant neoplasm, unspecified: Secondary | ICD-10-CM | POA: Diagnosis not present

## 2017-06-05 DIAGNOSIS — Z87891 Personal history of nicotine dependence: Secondary | ICD-10-CM | POA: Insufficient documentation

## 2017-06-05 DIAGNOSIS — E119 Type 2 diabetes mellitus without complications: Secondary | ICD-10-CM | POA: Diagnosis not present

## 2017-06-05 DIAGNOSIS — R109 Unspecified abdominal pain: Secondary | ICD-10-CM | POA: Diagnosis not present

## 2017-06-05 DIAGNOSIS — I252 Old myocardial infarction: Secondary | ICD-10-CM | POA: Diagnosis not present

## 2017-06-05 DIAGNOSIS — I110 Hypertensive heart disease with heart failure: Secondary | ICD-10-CM | POA: Diagnosis not present

## 2017-06-05 DIAGNOSIS — M7918 Myalgia, other site: Secondary | ICD-10-CM | POA: Insufficient documentation

## 2017-06-05 LAB — URINALYSIS, COMPLETE (UACMP) WITH MICROSCOPIC
BACTERIA UA: NONE SEEN
Bilirubin Urine: NEGATIVE
Glucose, UA: NEGATIVE mg/dL
Hgb urine dipstick: NEGATIVE
Ketones, ur: NEGATIVE mg/dL
Nitrite: NEGATIVE
PROTEIN: NEGATIVE mg/dL
RBC / HPF: NONE SEEN RBC/hpf (ref 0–5)
SPECIFIC GRAVITY, URINE: 1.008 (ref 1.005–1.030)
pH: 5 (ref 5.0–8.0)

## 2017-06-05 LAB — CULTURE, BLOOD (ROUTINE X 2): Special Requests: ADEQUATE

## 2017-06-05 LAB — BASIC METABOLIC PANEL
Anion gap: 8 (ref 5–15)
BUN: 43 mg/dL — AB (ref 6–20)
CHLORIDE: 100 mmol/L — AB (ref 101–111)
CO2: 25 mmol/L (ref 22–32)
Calcium: 9.2 mg/dL (ref 8.9–10.3)
Creatinine, Ser: 1.52 mg/dL — ABNORMAL HIGH (ref 0.44–1.00)
GFR calc Af Amer: 36 mL/min — ABNORMAL LOW (ref >60)
GFR, EST NON AFRICAN AMERICAN: 31 mL/min — AB (ref >60)
GLUCOSE: 121 mg/dL — AB (ref 65–99)
POTASSIUM: 4.7 mmol/L (ref 3.5–5.1)
Sodium: 133 mmol/L — ABNORMAL LOW (ref 135–145)

## 2017-06-05 LAB — CBC
HEMATOCRIT: 36.3 % (ref 35.0–47.0)
Hemoglobin: 12.2 g/dL (ref 12.0–16.0)
MCH: 30.7 pg (ref 26.0–34.0)
MCHC: 33.6 g/dL (ref 32.0–36.0)
MCV: 91.2 fL (ref 80.0–100.0)
Platelets: 213 10*3/uL (ref 150–440)
RBC: 3.98 MIL/uL (ref 3.80–5.20)
RDW: 13.2 % (ref 11.5–14.5)
WBC: 5.5 10*3/uL (ref 3.6–11.0)

## 2017-06-05 LAB — TROPONIN I: Troponin I: 0.06 ng/mL (ref <0.03)

## 2017-06-05 MED ORDER — IOPAMIDOL (ISOVUE-300) INJECTION 61%
60.0000 mL | Freq: Once | INTRAVENOUS | Status: AC | PRN
  Administered 2017-06-05: 60 mL via INTRAVENOUS

## 2017-06-05 MED ORDER — FENTANYL CITRATE (PF) 100 MCG/2ML IJ SOLN
50.0000 ug | Freq: Once | INTRAMUSCULAR | Status: AC
  Administered 2017-06-05: 50 ug via INTRAVENOUS
  Filled 2017-06-05: qty 2

## 2017-06-05 MED ORDER — TRAMADOL HCL 50 MG PO TABS: 50.0000 mg | ORAL_TABLET | Freq: Two times a day (BID) | ORAL | 0 refills | Status: DC | PRN

## 2017-06-05 NOTE — ED Provider Notes (Addendum)
[facility] Emergency Department Provider Note  Time seen: 5:25 PM  I have reviewed the triage vital signs and the nursing notes.   HISTORY  Chief Complaint Chest Pain    HPI Jacqueline [name] is a 83 y.o. female with a past medical history of CHF, diabetes, cardiomyopathy, hypertension, hyperlipidemia, presents to the emergency department for back pain.  According to the patient and record review the patient was admitted to the hospital for several days for an end STEMI and urinary tract infection was discharged yesterday.  Patient and family member state that while the patient was admitted she was complaining of back pain worse with movement, she states since going home her back pain has worsened significantly and now she was crying due to back pain anytime she had to move or sit up.  Patient denies any nausea, vomiting, diarrhea, denies current dysuria, is currently taking Bactrim for urinary tract infection per family.  Denies any current fever.  States the back pain is worse with any movement especially twisting or sitting up.  Past Medical History:  Diagnosis Date  . CHF (congestive heart failure) ([place])   . Diabetes mellitus without complication ([place]) 51/76/1607   Currently no high BS.  Has come off meds.  But having episodes of low BS now.  . Dilated idiopathic cardiomyopathy ([place])   . GERD (gastroesophageal reflux disease)   . Hyperlipidemia   . Hypertension   . Hypothyroidism   . Renal disorder     Patient Active Problem List   Diagnosis Date Noted  . MI, acute, non ST segment elevation ([place]) 06/01/2017  . Acute upper back pain 03/05/2017  . Carcinoma of unknown primary ([place]) 03/05/2017  . Liver lesion 02/27/2017  . Diabetes ([place]) 02/24/2017  . Chronic systolic CHF (congestive heart failure) ([place]) 02/24/2017  . HTN (hypertension) 02/24/2017  . GERD (gastroesophageal reflux disease) 02/24/2017  . Hypothyroidism 02/24/2017  . Hypoglycemia 02/24/2017   . Dizziness 02/18/2017    Past Surgical History:  Procedure Laterality Date  . BUNIONECTOMY    . COLONOSCOPY    . TONSILLECTOMY      Prior to Admission medications   Medication Sig Start Date End Date Taking? Authorizing Provider  aspirin EC 81 MG tablet Take 81 mg by mouth daily.    [provider]  atorvastatin (LIPITOR) 40 MG tablet Take 2 tablets (80 mg total) by mouth daily at 6 PM. 06/04/17   Sainani, Belia Heman, MD  carvedilol (COREG) 12.5 MG tablet Take 12.5 mg by mouth 2 (two) times daily with a meal.    [provider]  ergocalciferol (VITAMIN D2) 50000 units capsule Take 50,000 Units by mouth once a week.    [provider]  fenofibrate (TRICOR) 145 MG tablet Take 145 mg by mouth daily.    [provider]  furosemide (LASIX) 40 MG tablet Take 20-40 mg by mouth 2 (two) times daily. Take 40 mg in the morning and 20 mg in the evening.    [provider]  glucose blood (FREESTYLE LITE) test strip USE TO CHECK BLOOD SUGARS TWICE A DAY 02/19/16   [provider]  Iron-FA-B Cmp-C-Biot-Probiotic (FUSION PLUS) CAPS Take 1 capsule by mouth daily.     [provider]  isosorbide mononitrate (IMDUR) 60 MG 24 hr tablet Take 0.5 tablets (30 mg total) by mouth daily. 06/05/17   Henreitta Leber, MD  levothyroxine (SYNTHROID, LEVOTHROID) 25 MCG tablet Take 25 mcg by mouth daily before breakfast.  [provider]  meclizine (ANTIVERT) 25 MG tablet Take 12.5 mg by mouth 2 (two) times daily.    [provider]  pantoprazole (PROTONIX) 40 MG tablet Take 40 mg by mouth daily.    [provider]  potassium chloride (K-DUR) 10 MEQ tablet Take 10 mEq by mouth daily.    [provider]  ramipril (ALTACE) 10 MG capsule Take 20 mg by mouth daily.     [provider]  sertraline (ZOLOFT) 50 MG tablet Take 50 mg by mouth daily.    [provider]  sulfamethoxazole-trimethoprim (BACTRIM DS)  800-160 MG tablet Take 1 tablet by mouth 2 (two) times daily for 2 days. 06/04/17 06/06/17  Henreitta Leber, MD    Allergies  Allergen Reactions  . Ciprofloxacin   . Digoxin And Related   . Erythromycin   . Lopid [Gemfibrozil]   . Nitrofurantoin   . Cefuroxime Rash  . Penicillins Rash    Has patient had a PCN reaction causing immediate rash, facial/tongue/throat swelling, SOB or lightheadedness with hypotension: Unknown Has patient had a PCN reaction causing severe rash involving mucus membranes or skin necrosis: No Has patient had a PCN reaction that required hospitalization: No Has patient had a PCN reaction occurring within the last 10 years: No If all of the above answers are "NO", then may proceed with Cephalosporin use.     Family History  Problem Relation Age of Onset  . Premature CHD Brother     Social History Social History   Tobacco Use  . Smoking status: Former Research scientist (life sciences)  . Smokeless tobacco: Never Used  Substance Use Topics  . Alcohol use: No  . Drug use: No    Review of Systems Constitutional: Negative for fever. Cardiovascular: Negative for chest pain. Respiratory: Negative for shortness of breath. Gastrointestinal: Negative for abdominal pain, vomiting and diarrhea. Genitourinary: Negative for dysuria. Musculoskeletal: Positive for bilateral lower back pain. Neurological: Negative for headache All other ROS negative  ____________________________________________   PHYSICAL EXAM:  VITAL SIGNS: ED Triage Vitals  Enc Vitals Group     BP 06/05/17 1653 (!) 155/61     Pulse Rate 06/05/17 1653 72     Resp 06/05/17 1653 16     Temp 06/05/17 1653 97.9 F (36.6 C)     Temp Source 06/05/17 1653 Oral     SpO2 06/05/17 1653 91 %     Weight 06/05/17 1650 128 lb (58.1 kg)     Height 06/05/17 1650 4\' 10"  (1.473 m)     Head Circumference --      Peak Flow --      Pain Score --      Pain Loc --      Pain Edu? --      Excl. in [place]? --    Constitutional:  Alert and oriented. Well appearing and in no distress. Eyes: Normal exam ENT   Head: Normocephalic and atraumatic.   Mouth/Throat: Mucous membranes are moist. Cardiovascular: Normal rate, regular rhythm. No murmur Respiratory: Normal respiratory effort without tachypnea nor retractions. Breath sounds are clear  Gastrointestinal: Soft and nontender. No distention.  Moderate right CVA tenderness to palpation. Musculoskeletal: Nontender with normal range of motion in all extremities.  No midline C, T or L-spine tenderness, patient's pain is in the bilateral flanks. Neurologic:  Normal speech and language. No gross focal neurologic deficits Skin:  Skin is warm, dry and intact.  Psychiatric: Mood and affect are normal.   ____________________________________________  EKG  EKG reviewed and interpreted by myself shows normal sinus rhythm at 74 bpm with a slightly widened QRS, left axis deviation, slightly prolonged PR interval otherwise intervals are within normal limits, patient does have lateral ST changes/depressions however these are unchanged from prior EKG 06/03/17.  ____________________________________________    RADIOLOGY  CT negative besides pulmonary nodules  ____________________________________________   INITIAL IMPRESSION / ASSESSMENT AND PLAN / ED COURSE  Pertinent labs & imaging results that were available during my care of the patient were reviewed by me and considered in my medical decision making (see chart for details).  Patient presents to the emergency department for back pain worse with movement.  Differential would include musculoskeletal pain, muscle strain, lumbar strain, radicular pain, pyelonephritis or intra-abdominal pathology.  We will check labs including a chemistry as well as a urinalysis.  Given the patient's recent end STEMI we will recheck a troponin.  Patient is EKG is abnormal though unchanged from prior EKGs during the patient's  hospitalization.  The patient's exam and history is very consistent with musculoskeletal pain.  However given her recent urinary tract infection now with right CVA tenderness will obtain a CT scan of the abdomen/pelvis to rule out concerning abnormalities especially pyelonephritis.  I reviewed the patient's records including recent discharge summary from yesterday which the patient was recently admitted for urinary tract infection, hypoxia, elevated troponin ultimately deemed to be due to demand ischemia.  Patient's troponin is slightly elevated 0.06 although dramatically reduced from her discharge and recent troponins, suspect resolving troponin elevation.  No chest pain or shortness of breath.  EKG is unchanged.  Labs show slight renal insufficiency.  Labs are largely within normal limits.  CT scan negative for signs lower lobe pulmonary nodule unchanged from prior CT.  Highly suspect muscular skeletal pain.  We will discharge with a short course of tramadol and PCP follow-up.   ____________________________________________   FINAL CLINICAL IMPRESSION(S) / ED DIAGNOSES  Back pain    Harvest Dark, MD 06/05/17 Einar Crow    Harvest Dark, MD 06/05/17 [phone]

## 2017-06-05 NOTE — ED Notes (Signed)
MD at bedside. 

## 2017-06-05 NOTE — ED Triage Notes (Signed)
Pt presents to ED via pov c/o chest pressure. Recently seen here for UTI. History of chf.

## 2017-06-05 NOTE — Discharge Instructions (Signed)
As we discussed please take 1000 mg of Tylenol every 8 hours as needed for pain.  You have also been prescribed tramadol to take in addition if needed for severe pain.  Do not drive or drink alcohol while taking this medication.  Please follow-up with your doctor in the next several days for recheck/reevaluation if your pain continues or fails to improve.   Incidentally a 6 mm pulmonary nodule was seen on your CT scan unchanged from your prior CT scan but will need follow-up for a repeat CT scan in 6-12 months.  Please talk to your primary care doctor to arrange this.

## 2017-06-05 NOTE — Discharge Summary (Signed)
[facility] NAME: Prudy Candy    MR#:  [id number]  DATE OF BIRTH:  1933-05-16  DATE OF ADMISSION:  06/01/2017 ADMITTING PHYSICIAN: Gorden Harms, MD  DATE OF DISCHARGE: 06/04/2017  2:58 PM  PRIMARY CARE PHYSICIAN: Tracie Harrier, MD    ADMISSION DIAGNOSIS:  Hypoxia [R09.02] NSTEMI (non-ST elevated myocardial infarction) ([place]) [I21.4] Acute cystitis without hematuria [N30.00] Sepsis, due to unspecified organism ([place]) [A41.9] Acute on chronic congestive heart failure, unspecified heart failure type ([place]) [I50.9]  DISCHARGE DIAGNOSIS:  Active Problems:   MI, acute, non ST segment elevation ([place])   SECONDARY DIAGNOSIS:   Past Medical History:  Diagnosis Date  . CHF (congestive heart failure) ([place])   . Diabetes mellitus without complication ([place]) 64/40/3474   Currently no high BS.  Has come off meds.  But having episodes of low BS now.  . Dilated idiopathic cardiomyopathy ([place])   . GERD (gastroesophageal reflux disease)   . Hyperlipidemia   . Hypertension   . Hypothyroidism   . 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  1. CHF- this was acute on chronic systolic dysfunction. -Patient is underlying severe dilated cardiomyopathy ejection fraction of 30-35%. Patient was diuresed with IV Lasix and responded well to it, she feels better and therefore is being discharged home. She was ambulated and didn't qualify for home oxygen which is being arranged for her prior to discharge. -Patient will continue her carvedilol, ramipril..  2. Elevated troponin- this was demand ischemia. -Patient does have a severe dilated cardiomyopathy and it was unclear whether it ischemic or  nonischemic. -Troponins did not trending upwards. Seen by cardiology, repeat echocardiogram in the hospital showed no acute wall motion abnormalities with severely reduced EF of 30-35%.  patient was seen by cardiology and since her cardiomyopathy is chronic and is unchanged and did not recommend ischemic evaluation at this time. Patient will follow up with her cardiologist as an outpatient. -Patient was maintained on IV heparin in the hospital for 48 hours which was then discontinued. She will continue aspirin, carvedilol, atorvastatin, ramipril.  3. Urinary tract infection-patient 200,000 colonies of Escherichia coli. While in the hospital patient was treated with IV Levaquin, but discharge on 2 more days of oral Bactrim.  4. History of dilated cardiomyopathy-unclear but this is ischemic and nonischemic ischemic. -Further workup as an outpatient and follow-up with cardiology.  5. Chronic kidney disease stage III - Cr. Remained stable with diuresis can be further followed as an outpatient.  6. Hypothyroidism- pt. Will continue Synthroid.   7. Depression- pt. Will continue Zoloft.   DISCHARGE CONDITIONS:   Stable.   CONSULTS OBTAINED:  Treatment Team:  Isaias Cowman, MD  DRUG ALLERGIES:   Allergies  Allergen Reactions  . Ciprofloxacin   . Digoxin And Related   . Erythromycin   . Lopid [Gemfibrozil]   . Nitrofurantoin   . Cefuroxime Rash  . Penicillins Rash    Has patient had a PCN reaction causing immediate rash, facial/tongue/throat swelling, SOB or lightheadedness with hypotension: Unknown Has patient had a PCN reaction causing severe rash involving mucus membranes or skin necrosis: No Has patient had a PCN reaction that required hospitalization: No Has patient had a PCN reaction occurring within the last 10 years:  No If all of the above answers are "NO", then may proceed with Cephalosporin use.     DISCHARGE MEDICATIONS:   Allergies as of 06/04/2017       Reactions   Ciprofloxacin    Digoxin And Related    Erythromycin    Lopid [gemfibrozil]    Nitrofurantoin    Cefuroxime Rash   Penicillins Rash   Has patient had a PCN reaction causing immediate rash, facial/tongue/throat swelling, SOB or lightheadedness with hypotension: Unknown Has patient had a PCN reaction causing severe rash involving mucus membranes or skin necrosis: No Has patient had a PCN reaction that required hospitalization: No Has patient had a PCN reaction occurring within the last 10 years: No If all of the above answers are "NO", then may proceed with Cephalosporin use.      Medication List    TAKE these medications   aspirin EC 81 MG tablet Take 81 mg by mouth daily.   atorvastatin 40 MG tablet Commonly known as:  LIPITOR Take 2 tablets (80 mg total) by mouth daily at 6 PM.   carvedilol 12.5 MG tablet Commonly known as:  COREG Take 12.5 mg by mouth 2 (two) times daily with a meal.   ergocalciferol 50000 units capsule Commonly known as:  VITAMIN D2 Take 50,000 Units by mouth once a week.   fenofibrate 145 MG tablet Commonly known as:  TRICOR Take 145 mg by mouth daily.   FREESTYLE LITE test strip Generic drug:  glucose blood USE TO CHECK BLOOD SUGARS TWICE A DAY   furosemide 40 MG tablet Commonly known as:  LASIX Take 20-40 mg by mouth 2 (two) times daily. Take 40 mg in the morning and 20 mg in the evening.   FUSION PLUS Caps Take 1 capsule by mouth daily.   isosorbide mononitrate 60 MG 24 hr tablet Commonly known as:  IMDUR Take 0.5 tablets (30 mg total) by mouth daily.   levothyroxine 25 MCG tablet Commonly known as:  SYNTHROID, LEVOTHROID Take 25 mcg by mouth daily before breakfast.   meclizine 25 MG tablet Commonly known as:  ANTIVERT Take 12.5 mg by mouth 2 (two) times daily.   pantoprazole 40 MG tablet Commonly known as:  PROTONIX Take 40 mg by mouth daily.   potassium chloride 10 MEQ tablet Commonly known as:  K-DUR Take 10 mEq  by mouth daily.   ramipril 10 MG capsule Commonly known as:  ALTACE Take 20 mg by mouth daily.   sertraline 50 MG tablet Commonly known as:  ZOLOFT Take 50 mg by mouth daily.   sulfamethoxazole-trimethoprim 800-160 MG tablet Commonly known as:  BACTRIM DS Take 1 tablet by mouth 2 (two) times daily for 2 days.         DISCHARGE INSTRUCTIONS:   DIET:  Cardiac diet  DISCHARGE CONDITION:  Stable  ACTIVITY:  Activity as tolerated  OXYGEN:  Home Oxygen: Yes.     Oxygen Delivery: 2 liters/min via Patient connected to nasal cannula oxygen  DISCHARGE LOCATION:  Home with Home health Nursing, PT.    If you experience worsening of your admission symptoms, develop shortness of breath, life threatening emergency, suicidal or homicidal thoughts you must seek medical attention immediately by calling 911 or calling your MD immediately  if symptoms less severe.  You Must read complete instructions/literature along with all the possible adverse reactions/side effects for all the Medicines you take and that have been prescribed to you. Take any new Medicines after you have completely understood  and accpet all the possible adverse reactions/side effects.   Please note  You were cared for by a hospitalist during your hospital stay. If you have any questions about your discharge medications or the care you received while you were in the hospital after you are discharged, you can call the unit and asked to speak with the hospitalist on call if the hospitalist that took care of you is not available. Once you are discharged, your primary care physician will handle any further medical issues. Please note that NO REFILLS for any discharge medications will be authorized once you are discharged, as it is imperative that you return to your primary care physician (or establish a relationship with a primary care physician if you do not have one) for your aftercare needs so that they can reassess your  need for medications and monitor your lab values.     Today   No further shortness of breath, chest pain. Ambulated with the help of physical therapy and recommended home health services which is being arranged. Also noted to be hypoxic or induration therefore home oxygen being arranged. Patient's daughters at bedside.  VITAL SIGNS:  Blood pressure (!) 142/79, pulse 73, temperature 98.3 F (36.8 C), temperature source Oral, resp. rate 14, height 4\' 10"  (1.473 m), weight 58.1 kg (128 lb), SpO2 (!) 86 %.  I/O:  No intake or output data in the 24 hours ending 06/05/17 1428  PHYSICAL EXAMINATION:   GENERAL:  83 y.o.-year-old patient lying in bed in no acute distress.  EYES: Pupils equal, round, reactive to light and accommodation. No scleral icterus. Extraocular muscles intact.  HEENT: Head atraumatic, normocephalic. Oropharynx and nasopharynx clear.  NECK:  Supple, no jugular venous distention. No thyroid enlargement, no tenderness.  LUNGS: Normal breath sounds bilaterally, no wheezing, bibasliar rales, rhonchi. No use of accessory muscles of respiration.  CARDIOVASCULAR: S1, S2 normal. No murmurs, rubs, or gallops.  ABDOMEN: Soft, nontender, nondistended. Bowel sounds present. No organomegaly or mass.  EXTREMITIES: No cyanosis, clubbing or +1-2 edema b/l.    NEUROLOGIC: Cranial nerves II through XII are intact. No focal Motor or sensory deficits b/l.   PSYCHIATRIC: The patient is alert and oriented x 3.  SKIN: No obvious rash, lesion, or ulcer.    DATA REVIEW:   CBC Recent Labs  Lab 06/04/17 0330  WBC 4.8  HGB 11.4*  HCT 34.1*  PLT 177    Chemistries  Recent Labs  Lab 06/01/17 1924  06/04/17 0330  NA 130*   < > 133*  K 4.8   < > 3.8  CL 97*   < > 98*  CO2 25   < > 27  GLUCOSE 260*   < > 99  BUN 51*   < > 34*  CREATININE 1.48*   < > 1.28*  CALCIUM 8.7*   < > 9.0  AST 33  --   --   ALT 13*  --   --   ALKPHOS 48  --   --   BILITOT 1.4*  --   --    < > = values  in this interval not displayed.    Cardiac Enzymes Recent Labs  Lab 06/02/17 1031  TROPONINI 0.18*    Microbiology Results  Results for orders placed or performed during the hospital encounter of 06/01/17  Blood Culture (routine x 2)     Status: None (Preliminary result)   Collection Time: 06/01/17  5:39 PM  Result Value Ref Range Status  Specimen Description BLOOD RIGHT THUMB  Final   Special Requests   Final    BOTTLES DRAWN AEROBIC AND ANAEROBIC Blood Culture adequate volume   Culture  Setup Time PENDING  Incomplete   Culture NO GROWTH 4 DAYS  Final   Report Status PENDING  Incomplete  Urine culture     Status: Abnormal   Collection Time: 06/01/17  5:39 PM  Result Value Ref Range Status   Specimen Description URINE, RANDOM  Final   Special Requests NONE  Final   Culture >=100,000 COLONIES/mL ESCHERICHIA COLI (A)  Final   Report Status 06/04/2017 FINAL  Final   Organism ID, Bacteria ESCHERICHIA COLI (A)  Final      Susceptibility   Escherichia coli - MIC*    AMPICILLIN >=32 RESISTANT Resistant     CEFAZOLIN <=4 SENSITIVE Sensitive     CEFTRIAXONE <=1 SENSITIVE Sensitive     CIPROFLOXACIN <=0.25 SENSITIVE Sensitive     GENTAMICIN <=1 SENSITIVE Sensitive     IMIPENEM <=0.25 SENSITIVE Sensitive     NITROFURANTOIN <=16 SENSITIVE Sensitive     TRIMETH/SULFA <=20 SENSITIVE Sensitive     AMPICILLIN/SULBACTAM >=32 RESISTANT Resistant     PIP/TAZO <=4 SENSITIVE Sensitive     Extended ESBL NEGATIVE Sensitive     * >=100,000 COLONIES/mL ESCHERICHIA COLI  Blood Culture (routine x 2)     Status: Abnormal   Collection Time: 06/01/17  5:40 PM  Result Value Ref Range Status   Specimen Description BLOOD LAC  Final   Special Requests   Final    BOTTLES DRAWN AEROBIC AND ANAEROBIC Blood Culture adequate volume   Culture  Setup Time   Final    GRAM POSITIVE COCCI AEROBIC BOTTLE ONLY CRITICAL RESULT CALLED TO, READ BACK BY AND VERIFIED WITH: JASON ROBBINS @ 1956 ON 06/02/2017 BY  [facility]    Culture (A)  Final    STAPHYLOCOCCUS SPECIES (COAGULASE NEGATIVE) THE SIGNIFICANCE OF ISOLATING THIS ORGANISM FROM A SINGLE SET OF BLOOD CULTURES WHEN MULTIPLE SETS ARE DRAWN IS UNCERTAIN. PLEASE NOTIFY THE MICROBIOLOGY DEPARTMENT WITHIN ONE WEEK IF SPECIATION AND SENSITIVITIES ARE REQUIRED. Performed at [facility], [place] [address] [place], [place] [address]    Report Status 06/05/2017 FINAL  Final  Blood Culture ID Panel (Reflexed)     Status: Abnormal   Collection Time: 06/01/17  5:40 PM  Result Value Ref Range Status   Enterococcus species NOT DETECTED NOT DETECTED Final   Listeria monocytogenes NOT DETECTED NOT DETECTED Final   Staphylococcus species DETECTED (A) NOT DETECTED Final    Comment: Methicillin (oxacillin) resistant coagulase negative staphylococcus. Possible blood culture contaminant (unless isolated from more than one blood culture draw or clinical case suggests pathogenicity). No antibiotic treatment is indicated for blood  culture contaminants. CRITICAL RESULT CALLED TO, READ BACK BY AND VERIFIED WITH: JASON ROBBINS @ 1956 ON 06/02/2017 BY [facility]    Staphylococcus aureus NOT DETECTED NOT DETECTED Final   Methicillin resistance DETECTED (A) NOT DETECTED Final    Comment: CRITICAL RESULT CALLED TO, READ BACK BY AND VERIFIED WITH: JASON ROBBINS @ 1956 ON 06/02/2017 BY CAF    Streptococcus species NOT DETECTED NOT DETECTED Final   Streptococcus agalactiae NOT DETECTED NOT DETECTED Final   Streptococcus pneumoniae NOT DETECTED NOT DETECTED Final   Streptococcus pyogenes NOT DETECTED NOT DETECTED Final   Acinetobacter baumannii NOT DETECTED NOT DETECTED Final   Enterobacteriaceae species NOT DETECTED NOT DETECTED Final   Enterobacter cloacae complex NOT DETECTED NOT DETECTED Final  Escherichia coli NOT DETECTED NOT DETECTED Final   Klebsiella oxytoca NOT DETECTED NOT DETECTED Final   Klebsiella pneumoniae NOT DETECTED NOT DETECTED Final   Proteus species  NOT DETECTED NOT DETECTED Final   Serratia marcescens NOT DETECTED NOT DETECTED Final   Haemophilus influenzae NOT DETECTED NOT DETECTED Final   Neisseria meningitidis NOT DETECTED NOT DETECTED Final   Pseudomonas aeruginosa NOT DETECTED NOT DETECTED Final   Candida albicans NOT DETECTED NOT DETECTED Final   Candida glabrata NOT DETECTED NOT DETECTED Final   Candida krusei NOT DETECTED NOT DETECTED Final   Candida parapsilosis NOT DETECTED NOT DETECTED Final   Candida tropicalis NOT DETECTED NOT DETECTED Final  MRSA PCR Screening     Status: None   Collection Time: 06/01/17 10:46 PM  Result Value Ref Range Status   MRSA by PCR NEGATIVE NEGATIVE Final    Comment:        The GeneXpert MRSA Assay (FDA approved for NASAL specimens only), is one component of a comprehensive MRSA colonization surveillance program. It is not intended to diagnose MRSA infection nor to guide or monitor treatment for MRSA infections.     RADIOLOGY:  No results found.    Management plans discussed with the patient, family and they are in agreement.  CODE STATUS:  Code Status History    Date Active Date Inactive Code Status Order ID Comments User Context   06/01/2017 23:10 06/04/2017 18:03 DNR [id number]  Gorden Harms, MD Inpatient                      Questions for Most Recent Historical Code Status (Order [id number])    Question Answer Comment   In the event of cardiac or respiratory ARREST Do not call a "code blue"    In the event of cardiac or respiratory ARREST Do not perform Intubation, CPR, defibrillation or ACLS    In the event of cardiac or respiratory ARREST Use medication by any route, position, wound care, and other measures to relive pain and suffering. May use oxygen, suction and manual treatment of airway obstruction as needed for comfort.         Advance Directive Documentation     Most Recent Value  Type of Advance Directive  Healthcare Power of Attorney  Pre-existing out  of facility DNR order (yellow form or pink MOST form)  No data  "MOST" Form in Place?  No data      TOTAL TIME TAKING CARE OF THIS PATIENT: 40 minutes.    Henreitta Leber M.D on 06/05/2017 at 2:28 PM  Between 7am to 6pm - Pager - [phone]  After 6pm go to [email or web address] - Proofreader  [facility]  Office  [phone]  CC: Primary care physician; Tracie Harrier, MD

## 2017-06-08 ENCOUNTER — Emergency Department: Payer: Medicare Other

## 2017-06-08 ENCOUNTER — Inpatient Hospital Stay
Admission: EM | Admit: 2017-06-08 | Discharge: 2017-06-15 | DRG: 478 | Disposition: A | Discharge status: Skilled Nursing Facility | Payer: Medicare Other | Attending: Internal Medicine | Admitting: Internal Medicine

## 2017-06-08 ENCOUNTER — Other Ambulatory Visit: Payer: Self-pay

## 2017-06-08 ENCOUNTER — Encounter: Payer: Self-pay | Admitting: Emergency Medicine

## 2017-06-08 DIAGNOSIS — Z881 Allergy status to other antibiotic agents status: Secondary | ICD-10-CM

## 2017-06-08 DIAGNOSIS — Z9981 Dependence on supplemental oxygen: Secondary | ICD-10-CM

## 2017-06-08 DIAGNOSIS — I420 Dilated cardiomyopathy: Secondary | ICD-10-CM | POA: Diagnosis present

## 2017-06-08 DIAGNOSIS — I5022 Chronic systolic (congestive) heart failure: Secondary | ICD-10-CM

## 2017-06-08 DIAGNOSIS — N179 Acute kidney failure, unspecified: Secondary | ICD-10-CM | POA: Diagnosis present

## 2017-06-08 DIAGNOSIS — K219 Gastro-esophageal reflux disease without esophagitis: Secondary | ICD-10-CM | POA: Diagnosis present

## 2017-06-08 DIAGNOSIS — M4854XA Collapsed vertebra, not elsewhere classified, thoracic region, initial encounter for fracture: Principal | ICD-10-CM | POA: Diagnosis present

## 2017-06-08 DIAGNOSIS — N183 Chronic kidney disease, stage 3 (moderate): Secondary | ICD-10-CM | POA: Diagnosis present

## 2017-06-08 DIAGNOSIS — Z880 Allergy status to penicillin: Secondary | ICD-10-CM

## 2017-06-08 DIAGNOSIS — S22000A Wedge compression fracture of unspecified thoracic vertebra, initial encounter for closed fracture: Secondary | ICD-10-CM

## 2017-06-08 DIAGNOSIS — E875 Hyperkalemia: Secondary | ICD-10-CM | POA: Diagnosis not present

## 2017-06-08 DIAGNOSIS — R0602 Shortness of breath: Secondary | ICD-10-CM | POA: Diagnosis not present

## 2017-06-08 DIAGNOSIS — Z7982 Long term (current) use of aspirin: Secondary | ICD-10-CM

## 2017-06-08 DIAGNOSIS — Z79890 Hormone replacement therapy (postmenopausal): Secondary | ICD-10-CM

## 2017-06-08 DIAGNOSIS — Z419 Encounter for procedure for purposes other than remedying health state, unspecified: Secondary | ICD-10-CM

## 2017-06-08 DIAGNOSIS — T502X5A Adverse effect of carbonic-anhydrase inhibitors, benzothiadiazides and other diuretics, initial encounter: Secondary | ICD-10-CM | POA: Diagnosis present

## 2017-06-08 DIAGNOSIS — I130 Hypertensive heart and chronic kidney disease with heart failure and stage 1 through stage 4 chronic kidney disease, or unspecified chronic kidney disease: Secondary | ICD-10-CM | POA: Diagnosis present

## 2017-06-08 DIAGNOSIS — E785 Hyperlipidemia, unspecified: Secondary | ICD-10-CM | POA: Diagnosis present

## 2017-06-08 DIAGNOSIS — N390 Urinary tract infection, site not specified: Secondary | ICD-10-CM | POA: Diagnosis present

## 2017-06-08 DIAGNOSIS — I252 Old myocardial infarction: Secondary | ICD-10-CM

## 2017-06-08 DIAGNOSIS — E871 Hypo-osmolality and hyponatremia: Secondary | ICD-10-CM | POA: Diagnosis present

## 2017-06-08 DIAGNOSIS — Z888 Allergy status to other drugs, medicaments and biological substances status: Secondary | ICD-10-CM

## 2017-06-08 DIAGNOSIS — Z87891 Personal history of nicotine dependence: Secondary | ICD-10-CM

## 2017-06-08 DIAGNOSIS — E1122 Type 2 diabetes mellitus with diabetic chronic kidney disease: Secondary | ICD-10-CM | POA: Diagnosis present

## 2017-06-08 DIAGNOSIS — E039 Hypothyroidism, unspecified: Secondary | ICD-10-CM | POA: Diagnosis present

## 2017-06-08 LAB — BASIC METABOLIC PANEL
ANION GAP: 8 (ref 5–15)
BUN: 39 mg/dL — ABNORMAL HIGH (ref 6–20)
CALCIUM: 9.4 mg/dL (ref 8.9–10.3)
CO2: 27 mmol/L (ref 22–32)
Chloride: 92 mmol/L — ABNORMAL LOW (ref 101–111)
Creatinine, Ser: 2.16 mg/dL — ABNORMAL HIGH (ref 0.44–1.00)
GFR, EST AFRICAN AMERICAN: 23 mL/min — AB (ref >60)
GFR, EST NON AFRICAN AMERICAN: 20 mL/min — AB (ref >60)
GLUCOSE: 137 mg/dL — AB (ref 65–99)
POTASSIUM: 4.9 mmol/L (ref 3.5–5.1)
Sodium: 127 mmol/L — ABNORMAL LOW (ref 135–145)

## 2017-06-08 LAB — CBC
HEMATOCRIT: 38.6 % (ref 35.0–47.0)
HEMOGLOBIN: 13 g/dL (ref 12.0–16.0)
MCH: 30.7 pg (ref 26.0–34.0)
MCHC: 33.7 g/dL (ref 32.0–36.0)
MCV: 90.9 fL (ref 80.0–100.0)
Platelets: 277 10*3/uL (ref 150–440)
RBC: 4.25 MIL/uL (ref 3.80–5.20)
RDW: 13.3 % (ref 11.5–14.5)
WBC: 8.1 10*3/uL (ref 3.6–11.0)

## 2017-06-08 LAB — URINALYSIS, COMPLETE (UACMP) WITH MICROSCOPIC
BACTERIA UA: NONE SEEN
BILIRUBIN URINE: NEGATIVE
Glucose, UA: NEGATIVE mg/dL
HGB URINE DIPSTICK: NEGATIVE
KETONES UR: NEGATIVE mg/dL
LEUKOCYTES UA: NEGATIVE
NITRITE: NEGATIVE
PROTEIN: NEGATIVE mg/dL
RBC / HPF: NONE SEEN RBC/hpf (ref 0–5)
SPECIFIC GRAVITY, URINE: 1.006 (ref 1.005–1.030)
pH: 6 (ref 5.0–8.0)

## 2017-06-08 LAB — HEPATIC FUNCTION PANEL
ALBUMIN: 4.1 g/dL (ref 3.5–5.0)
ALK PHOS: 45 U/L (ref 38–126)
ALT: 19 U/L (ref 14–54)
AST: 40 U/L (ref 15–41)
BILIRUBIN INDIRECT: 0.7 mg/dL (ref 0.3–0.9)
Bilirubin, Direct: 0.2 mg/dL (ref 0.1–0.5)
TOTAL PROTEIN: 8 g/dL (ref 6.5–8.1)
Total Bilirubin: 0.9 mg/dL (ref 0.3–1.2)

## 2017-06-08 LAB — TROPONIN I: TROPONIN I: 0.06 ng/mL — AB (ref <0.03)

## 2017-06-08 LAB — BRAIN NATRIURETIC PEPTIDE: B Natriuretic Peptide: 650 pg/mL — ABNORMAL HIGH (ref 0.0–100.0)

## 2017-06-08 MED ORDER — SODIUM CHLORIDE 0.9 % IV SOLN
Freq: Once | INTRAVENOUS | Status: AC
  Administered 2017-06-08: 23:00:00 via INTRAVENOUS

## 2017-06-08 MED ORDER — FENTANYL CITRATE (PF) 100 MCG/2ML IJ SOLN
50.0000 ug | Freq: Once | INTRAMUSCULAR | Status: AC
  Administered 2017-06-08: 50 ug via INTRAVENOUS
  Filled 2017-06-08: qty 2

## 2017-06-08 NOTE — ED Triage Notes (Signed)
Pt to ED via EMS from home c/o mid back pain radiating to bilateral lower ribs for a couple days, also states SOB.  Pt was 93% RA for EMS and placed on 2L [place] and up to 99%.  Upon arrival patient is 96% RA.  Pt recently seen and discharged from ED.

## 2017-06-08 NOTE — ED Provider Notes (Signed)
[facility] Emergency Department Provider Note  ____________________________________________   I have reviewed the triage vital signs and the nursing notes. Where available I have reviewed prior notes and, if possible and indicated, outside hospital notes.    HISTORY  Chief Complaint Back Pain and Chest Pain    HPI Jacqueline [name] is a 83 y.o. female a history of CHF, last EF of 30-35%, had an N STEMI a week ago which was thought to be demand ischemia, no heart cath was performed, she is on Lasix for her CHF and a new oxygen requirement during her last admission results and O2 placement and she is on 2 L at all times now.  She presents with multiple different complaints.  The first is back pain in the upper back.  She has had this before but it is much worse seems to radiate around to the front.  She denies any nausea or vomiting but she states that she has had increasing shortness of breath over the last couple days and any minor exertion around the house he worse.  She denies significant leg swelling, she denies fever chills or cough.  Patient is also being treated for urinary tract infection with Bactrim, it does not have dysuria anymore.  Family very concerned that she is unable to get around the house because of pain and because of her heart failure.    Past Medical History:  Diagnosis Date  . CHF (congestive heart failure) ([place])   . Diabetes mellitus without complication ([place]) 76/28/3151   Currently no high BS.  Has come off meds.  But having episodes of low BS now.  . Dilated idiopathic cardiomyopathy ([place])   . GERD (gastroesophageal reflux disease)   . Hyperlipidemia   . Hypertension   . Hypothyroidism   . Renal disorder     Patient Active Problem List   Diagnosis Date Noted  . MI, acute, non ST segment elevation ([place]) 06/01/2017  . Acute upper back pain 03/05/2017  . Carcinoma of unknown primary ([place]) 03/05/2017  . Liver lesion 02/27/2017  .  Diabetes ([place]) 02/24/2017  . Chronic systolic CHF (congestive heart failure) ([place]) 02/24/2017  . HTN (hypertension) 02/24/2017  . GERD (gastroesophageal reflux disease) 02/24/2017  . Hypothyroidism 02/24/2017  . Hypoglycemia 02/24/2017  . Dizziness 02/18/2017    Past Surgical History:  Procedure Laterality Date  . BUNIONECTOMY    . COLONOSCOPY    . TONSILLECTOMY      Prior to Admission medications   Medication Sig Start Date End Date Taking? Authorizing Provider  aspirin EC 81 MG tablet Take 81 mg by mouth daily.   Yes [provider]  atorvastatin (LIPITOR) 40 MG tablet Take 2 tablets (80 mg total) by mouth daily at 6 PM. 06/04/17  Yes Sainani, Belia Heman, MD  carvedilol (COREG) 12.5 MG tablet Take 12.5 mg by mouth 2 (two) times daily with a meal.   Yes [provider]  ergocalciferol (VITAMIN D2) 50000 units capsule Take 50,000 Units by mouth once a week.   Yes [provider]  fenofibrate (TRICOR) 145 MG tablet Take 145 mg by mouth daily.   Yes [provider]  furosemide (LASIX) 40 MG tablet Take 20-40 mg by mouth 2 (two) times daily. Take 40 mg in the morning and 20 mg in the evening.   Yes [provider]  Iron-FA-B Cmp-C-Biot-Probiotic (FUSION PLUS) CAPS Take 1 capsule by mouth daily.    Yes [provider]  isosorbide mononitrate (IMDUR)  60 MG 24 hr tablet Take 0.5 tablets (30 mg total) by mouth daily. 06/05/17  Yes Sainani, Belia Heman, MD  levothyroxine (SYNTHROID, LEVOTHROID) 25 MCG tablet Take 25 mcg by mouth daily before breakfast.   Yes [provider]  meclizine (ANTIVERT) 25 MG tablet Take 12.5 mg by mouth 2 (two) times daily.   Yes [provider]  pantoprazole (PROTONIX) 40 MG tablet Take 40 mg by mouth daily.   Yes [provider]  potassium chloride (K-DUR) 10 MEQ tablet Take 10 mEq by mouth daily.   Yes [provider]  ramipril (ALTACE) 10 MG capsule Take 20 mg by mouth daily.    Yes  [provider]  sertraline (ZOLOFT) 50 MG tablet Take 50 mg by mouth daily.   Yes [provider]  traMADol (ULTRAM) 50 MG tablet Take 1 tablet (50 mg total) by mouth every 12 (twelve) hours as needed for moderate pain. 06/05/17  Yes Harvest Dark, MD  glucose blood (FREESTYLE LITE) test strip USE TO CHECK BLOOD SUGARS TWICE A DAY 02/19/16   [provider]    Allergies Ciprofloxacin; Digoxin and related; Erythromycin; Lopid [gemfibrozil]; Nitrofurantoin; Cefuroxime; and Penicillins  Family History  Problem Relation Age of Onset  . Premature CHD Brother     Social History Social History   Tobacco Use  . Smoking status: Former Research scientist (life sciences)  . Smokeless tobacco: Never Used  Substance Use Topics  . Alcohol use: No  . Drug use: No    Review of Systems Constitutional: No fever/chills Eyes: No visual changes. ENT: No sore throat. No stiff neck no neck pain Cardiovascular: Denies chest pain. Respiratory: Positive shortness of breath. Gastrointestinal:   no vomiting.  No diarrhea.  No constipation. Genitourinary: Negative for dysuria. Musculoskeletal: Negative lower extremity swelling Skin: Negative for rash. Neurological: Negative for severe headaches, focal weakness or numbness.   ____________________________________________   PHYSICAL EXAM:  VITAL SIGNS: ED Triage Vitals  Enc Vitals Group     BP 06/08/17 2128 (!) 151/81     Pulse Rate 06/08/17 2128 85     Resp 06/08/17 2128 (!) 27     Temp 06/08/17 2128 97.8 F (36.6 C)     Temp Source 06/08/17 2128 Oral     SpO2 06/08/17 2128 96 %     Weight 06/08/17 2129 133 lb 8 oz (60.6 kg)     Height 06/08/17 2129 4\' 10"  (1.473 m)     Head Circumference --      Peak Flow --      Pain Score 06/08/17 2127 5     Pain Loc --      Pain Edu? --      Excl. in [place]? --     Constitutional: Alert and oriented.  [place] ill-appearing on oxygen, appears somewhat weak but not toxic. Eyes: Conjunctivae are  normal Head: Atraumatic HEENT: No congestion/rhinnorhea. Mucous membranes are moist.  Oropharynx non-erythematous Neck:   Nontender with no meningismus, no masses, no stridor Cardiovascular: Normal rate, regular rhythm. Grossly normal heart sounds.  Good peripheral circulation. Respiratory: Normal respiratory effort.  No retractions. Lungs CTAB. Abdominal: Soft and nontender. No distention. No guarding no rebound Back: Focal tenderness to palpation in the midthoracic region around T5 which reproduces her pain no obvious lesions or deformity are no lesions noted. there is no CVA tenderness Musculoskeletal: No lower extremity tenderness, no upper extremity tenderness. No joint effusions, no DVT signs strong distal pulses no edema Neurologic:  Normal speech and language.  No gross focal neurologic deficits are appreciated.  Skin:  Skin is warm, dry and intact. No rash noted. Psychiatric: Mood and affect are normal. Speech and behavior are normal.  ____________________________________________   LABS (all labs ordered are listed, but only abnormal results are displayed)  Labs Reviewed  BASIC METABOLIC PANEL - Abnormal; Notable for the following components:      Result Value   Sodium 127 (*)    Chloride 92 (*)    Glucose, Bld 137 (*)    BUN 39 (*)    Creatinine, Ser 2.16 (*)    GFR calc non Af Amer 20 (*)    GFR calc Af Amer 23 (*)    All other components within normal limits  TROPONIN I - Abnormal; Notable for the following components:   Troponin I 0.06 (*)    All other components within normal limits  URINALYSIS, COMPLETE (UACMP) WITH MICROSCOPIC - Abnormal; Notable for the following components:   Color, Urine YELLOW (*)    APPearance CLEAR (*)    Squamous Epithelial / LPF 0-5 (*)    All other components within normal limits  URINE CULTURE  CBC  HEPATIC FUNCTION PANEL  BRAIN NATRIURETIC PEPTIDE    Pertinent labs  results that were available during my care of the patient were  reviewed by me and considered in my medical decision making (see chart for details). ____________________________________________  EKG  I personally interpreted any EKGs ordered by me or triage Heart rate 86 sinus rhythm, borderline partial left bundle branch block, ST changes consistent with prior in aVL and lateral. ____________________________________________  RADIOLOGY  Pertinent labs & imaging results that were available during my care of the patient were reviewed by me and considered in my medical decision making (see chart for details). If possible, patient and/or family made aware of any abnormal findings.  Dg Chest 2 View  Result Date: 06/08/2017 CLINICAL DATA:  Back pain radiating to the ribs EXAM: CHEST  2 VIEW COMPARISON:  Chest radiograph 06/02/2017 FINDINGS: There is unchanged cardiomegaly and calcific aortic atherosclerosis. Pulmonary vascular congestion is unchanged. There is an enchondroma within the proximal left humerus. No focal consolidation, pleural effusion or pneumothorax. There is a compression fracture of a midthoracic vertebral body that is new compared to 06/01/2017. IMPRESSION: 1. New compression fracture of a midthoracic vertebral body since 06/01/2017, in keeping with reported midthoracic back pain. 2. Unchanged pulmonary vascular congestion with cardiomegaly and calcific aortic atherosclerosis (ICD10-I70.0). No overt pulmonary edema. Electronically Signed   By: Ulyses Jarred M.D.   On: 06/08/2017 22:07   ____________________________________________    PROCEDURES  Procedure(s) performed: None  Procedures  Critical Care performed: None  ____________________________________________   INITIAL IMPRESSION / ASSESSMENT AND PLAN / ED COURSE  Pertinent labs & imaging results that were available during my care of the patient were reviewed by me and considered in my medical decision making (see chart for details).  Multiple different problems noted today.   Patient has increased shortness of breath at home which is very concerning to the family, even moving in the bed here with her oxygen on his she desats to the low 90s high 80s 89%.  That is without walking.  She has had a negative CT scan of her chest recently and this is most likely her chronic CHF, we are awaiting BNP I will hold on Lasix pending BNP because of her elevated creatinine. Patient has acute kidney injury, her last creatinine was 1.5 she is at 2.16 but  before that she was at one point to some of this certainly could be from Lasix. I will start her on very gentle hydration here pending BNP, at the request of the hospitalist for hyponatremia and elevated creatinine  Patient also has an acute compression fracture not seen on prior CT scan, there was no trauma, this is likely osteoporoticly  mediated.  We are giving her fentanyl but family feels that they cannot control her pain at home I think that is not unreasonable given all of these different problems hyponatremia elevated creatinine, new onset fracture and significant shortness of breath will admit for further evaluation.     ____________________________________________   FINAL CLINICAL IMPRESSION(S) / ED DIAGNOSES  Final diagnoses:  AKI (acute kidney injury) ([place])  Compression fracture of body of thoracic vertebra ([place])      This chart was dictated using voice recognition software.  Despite best efforts to proofread,  errors can occur which can change meaning.      Schuyler Amor, MD 06/08/17 [phone]

## 2017-06-08 NOTE — ED Notes (Signed)
Patient transported to X-ray 

## 2017-06-08 NOTE — H&P (Signed)
[place] [facility] [place] NAME: Jacqueline [name]    MR#:  [id number]  DATE OF BIRTH:  1933-04-30  DATE OF ADMISSION:  06/08/2017  PRIMARY CARE PHYSICIAN: Tracie Harrier, MD   REQUESTING/REFERRING PHYSICIAN: Schuyler Amor, MD   CHIEF COMPLAINT:   Chief Complaint  Patient presents with  . Back Pain  . Chest Pain    HISTORY OF PRESENT ILLNESS:  Cerise Lieber  is a 83 y.o. female with a known history of chronic systolic CHF, last EF of 76-28%, had an NSTEMI a week ago which was thought to be demand ischemia, no heart cath was performed, she is on Lasix for her CHF and a new oxygen requirement during her last admission results and O2 placement and she is on 2 L at all times now.  She presents with multiple different complaints.  The first is back pain in the upper back.  She has had this before but it is much worse seems to radiate around to the front.  she states that she has had increasing shortness of breath over the last couple days and any minor exertion around the house he worse.  She denies significant leg swelling, she denies fever chills or cough.  Patient is also being treated for urinary tract infection with Bactrim, it does not have dysuria anymore.  Family very concerned that she is unable to get around the house because of pain and because of her heart failure.  In the emergency department she underwent chest x-ray which shows new compression fracture in the mid thoracic vertebra. she is being admitted for further evaluation and management. she was also noted to have hyponatremia with a sodium of 127. PAST MEDICAL HISTORY:   Past Medical History:  Diagnosis Date  . CHF (congestive heart failure) ([place])   . Diabetes mellitus without complication ([place]) 31/51/7616   Currently no high BS.  Has come off meds.  But having episodes of low BS now.  . Dilated idiopathic cardiomyopathy ([place])   . GERD (gastroesophageal reflux disease)   .  Hyperlipidemia   . Hypertension   . Hypothyroidism   . Renal disorder     PAST SURGICAL HISTORY:   Past Surgical History:  Procedure Laterality Date  . BUNIONECTOMY    . COLONOSCOPY    . TONSILLECTOMY      SOCIAL HISTORY:   Social History   Tobacco Use  . Smoking status: Former Research scientist (life sciences)  . Smokeless tobacco: Never Used  Substance Use Topics  . Alcohol use: No    FAMILY HISTORY:   Family History  Problem Relation Age of Onset  . Premature CHD Brother     DRUG ALLERGIES:   Allergies  Allergen Reactions  . Ciprofloxacin   . Digoxin And Related   . Erythromycin   . Lopid [Gemfibrozil]   . Nitrofurantoin   . Cefuroxime Rash  . Penicillins Rash    Has patient had a PCN reaction causing immediate rash, facial/tongue/throat swelling, SOB or lightheadedness with hypotension: Unknown Has patient had a PCN reaction causing severe rash involving mucus membranes or skin necrosis: No Has patient had a PCN reaction that required hospitalization: No Has patient had a PCN reaction occurring within the last 10 years: No If all of the above answers are "NO", then may proceed with Cephalosporin use.     REVIEW OF SYSTEMS:   Review of Systems  Constitutional: Positive for malaise/fatigue. Negative for chills, fever and weight loss.  HENT:  Negative for nosebleeds and sore throat.   Eyes: Negative for blurred vision.  Respiratory: Positive for shortness of breath. Negative for cough and wheezing.   Cardiovascular: Negative for chest pain, orthopnea, leg swelling and PND.  Gastrointestinal: Negative for abdominal pain, constipation, diarrhea, heartburn, nausea and vomiting.  Genitourinary: Negative for dysuria and urgency.  Musculoskeletal: Positive for back pain.  Skin: Negative for rash.  Neurological: Positive for weakness. Negative for dizziness, speech change, focal weakness and headaches.  Endo/Heme/Allergies: Does not bruise/bleed easily.  Psychiatric/Behavioral:  Negative for depression.    MEDICATIONS AT HOME:   Prior to Admission medications   Medication Sig Start Date End Date Taking? Authorizing Provider  aspirin EC 81 MG tablet Take 81 mg by mouth daily.   Yes [provider]  atorvastatin (LIPITOR) 40 MG tablet Take 2 tablets (80 mg total) by mouth daily at 6 PM. 06/04/17  Yes Sainani, Belia Heman, MD  carvedilol (COREG) 12.5 MG tablet Take 12.5 mg by mouth 2 (two) times daily with a meal.   Yes [provider]  ergocalciferol (VITAMIN D2) 50000 units capsule Take 50,000 Units by mouth once a week.   Yes [provider]  fenofibrate (TRICOR) 145 MG tablet Take 145 mg by mouth daily.   Yes [provider]  furosemide (LASIX) 40 MG tablet Take 20-40 mg by mouth 2 (two) times daily. Take 40 mg in the morning and 20 mg in the evening.   Yes [provider]  Iron-FA-B Cmp-C-Biot-Probiotic (FUSION PLUS) CAPS Take 1 capsule by mouth daily.    Yes [provider]  isosorbide mononitrate (IMDUR) 60 MG 24 hr tablet Take 0.5 tablets (30 mg total) by mouth daily. 06/05/17  Yes Sainani, Belia Heman, MD  levothyroxine (SYNTHROID, LEVOTHROID) 25 MCG tablet Take 25 mcg by mouth daily before breakfast.   Yes [provider]  meclizine (ANTIVERT) 25 MG tablet Take 12.5 mg by mouth 2 (two) times daily.   Yes [provider]  pantoprazole (PROTONIX) 40 MG tablet Take 40 mg by mouth daily.   Yes [provider]  potassium chloride (K-DUR) 10 MEQ tablet Take 10 mEq by mouth daily.   Yes [provider]  ramipril (ALTACE) 10 MG capsule Take 20 mg by mouth daily.    Yes [provider]  sertraline (ZOLOFT) 50 MG tablet Take 50 mg by mouth daily.   Yes [provider]  traMADol (ULTRAM) 50 MG tablet Take 1 tablet (50 mg total) by mouth every 12 (twelve) hours as needed for moderate pain. 06/05/17  Yes Paduchowski, Lennette Bihari, MD  glucose blood (FREESTYLE LITE) test strip USE TO  CHECK BLOOD SUGARS TWICE A DAY 02/19/16   [provider]      VITAL SIGNS:  Blood pressure (!) 144/72, pulse 85, temperature 97.8 F (36.6 C), temperature source Oral, resp. rate (!) 32, height 4\' 10"  (1.473 m), weight 60.6 kg (133 lb 8 oz), SpO2 99 %.  PHYSICAL EXAMINATION:  Physical Exam  GENERAL:  83 y.o.-year-old patient lying in the bed with no acute distress.  EYES: Pupils equal, round, reactive to light and accommodation. No scleral icterus. Extraocular muscles intact.  HEENT: Head atraumatic, normocephalic. Oropharynx and nasopharynx clear.  NECK:  Supple, no jugular venous distention. No thyroid enlargement, no tenderness.  LUNGS: Normal breath sounds bilaterally, no wheezing, rales,rhonchi or crepitation. No use of accessory muscles of respiration.  CARDIOVASCULAR: S1, S2 normal. No murmurs, rubs, or gallops.  ABDOMEN: Soft, nontender, nondistended. Bowel sounds  present. No organomegaly or mass.  EXTREMITIES: No pedal edema, cyanosis, or clubbing.  NEUROLOGIC: Cranial nerves II through XII are intact. Muscle strength 5/5 in all extremities. Sensation intact. Gait not checked.  PSYCHIATRIC: The patient is alert and oriented x 3.  SKIN: No obvious rash, lesion, or ulcer.  LABORATORY PANEL:   CBC Recent Labs  Lab 06/08/17 2127  WBC 8.1  HGB 13.0  HCT 38.6  PLT 277   ------------------------------------------------------------------------------------------------------------------  Chemistries  Recent Labs  Lab 06/08/17 2127  NA 127*  K 4.9  CL 92*  CO2 27  GLUCOSE 137*  BUN 39*  CREATININE 2.16*  CALCIUM 9.4  AST 40  ALT 19  ALKPHOS 45  BILITOT 0.9   ------------------------------------------------------------------------------------------------------------------  Cardiac Enzymes Recent Labs  Lab 06/08/17 2127  TROPONINI 0.06*    ------------------------------------------------------------------------------------------------------------------  RADIOLOGY:  Dg Chest 2 View  Result Date: 06/08/2017 CLINICAL DATA:  Back pain radiating to the ribs EXAM: CHEST  2 VIEW COMPARISON:  Chest radiograph 06/02/2017 FINDINGS: There is unchanged cardiomegaly and calcific aortic atherosclerosis. Pulmonary vascular congestion is unchanged. There is an enchondroma within the proximal left humerus. No focal consolidation, pleural effusion or pneumothorax. There is a compression fracture of a midthoracic vertebral body that is new compared to 06/01/2017. IMPRESSION: 1. New compression fracture of a midthoracic vertebral body since 06/01/2017, in keeping with reported midthoracic back pain. 2. Unchanged pulmonary vascular congestion with cardiomegaly and calcific aortic atherosclerosis (ICD10-I70.0). No overt pulmonary edema. Electronically Signed   By: Ulyses Jarred M.D.   On: 06/08/2017 22:07   IMPRESSION AND PLAN:  83 year old female with a known history of chronic systolic CHF, last EF of 03-49%, had an NSTEMI a week ago now is being admitted for new compression fracture of mid thoracic vertebral body and severe hyponatremia  *New compression fracture mid thoracic vertebral body -no reported fall -Likely conservative management - We will consult neurosurgery -she may benefit from brace -Physical therapy consultation, pain medicine as needed  *Severe hyponatremia -Likely from overdiuresis -Hold off Lasix and provide gentle hydration  *Acute on chronic kidney disease stage III -Likely due to overdiuresis -Creatinine up to 2.1 -Monitor kidney function hold off ACE inhibitor and Lasix  *Chronic systolic CHF -EF of 17-91% -Well compensated at this time -Monitor ON telemetry    All the records are reviewed and case discussed with ED provider. Management plans discussed with the patient, family and they are in agreement.  CODE  STATUS: FULL CODE  TOTAL TIME TAKING CARE OF THIS PATIENT: 45 minutes.    Max Sane M.D on 06/08/2017 at 11:59 PM  Between 7am to 6pm - Pager - [phone]  After 6pm go to [email or web address] - Proofreader  [facility]  Office  [phone]  CC: Primary care physician; Tracie Harrier, MD   Note: This dictation was prepared with Dragon dictation along with smaller phrase technology. Any transcriptional errors that result from this process are unintentional.

## 2017-06-09 ENCOUNTER — Other Ambulatory Visit: Payer: Self-pay

## 2017-06-09 ENCOUNTER — Inpatient Hospital Stay: Payer: Medicare Other

## 2017-06-09 DIAGNOSIS — N179 Acute kidney failure, unspecified: Secondary | ICD-10-CM | POA: Diagnosis present

## 2017-06-09 DIAGNOSIS — E871 Hypo-osmolality and hyponatremia: Secondary | ICD-10-CM

## 2017-06-09 DIAGNOSIS — R0602 Shortness of breath: Secondary | ICD-10-CM | POA: Diagnosis present

## 2017-06-09 DIAGNOSIS — Z888 Allergy status to other drugs, medicaments and biological substances status: Secondary | ICD-10-CM | POA: Diagnosis not present

## 2017-06-09 DIAGNOSIS — Z79890 Hormone replacement therapy (postmenopausal): Secondary | ICD-10-CM | POA: Diagnosis not present

## 2017-06-09 DIAGNOSIS — Z881 Allergy status to other antibiotic agents status: Secondary | ICD-10-CM | POA: Diagnosis not present

## 2017-06-09 DIAGNOSIS — N390 Urinary tract infection, site not specified: Secondary | ICD-10-CM | POA: Diagnosis present

## 2017-06-09 DIAGNOSIS — I130 Hypertensive heart and chronic kidney disease with heart failure and stage 1 through stage 4 chronic kidney disease, or unspecified chronic kidney disease: Secondary | ICD-10-CM | POA: Diagnosis present

## 2017-06-09 DIAGNOSIS — K219 Gastro-esophageal reflux disease without esophagitis: Secondary | ICD-10-CM | POA: Diagnosis present

## 2017-06-09 DIAGNOSIS — E039 Hypothyroidism, unspecified: Secondary | ICD-10-CM | POA: Diagnosis present

## 2017-06-09 DIAGNOSIS — E785 Hyperlipidemia, unspecified: Secondary | ICD-10-CM | POA: Diagnosis present

## 2017-06-09 DIAGNOSIS — I252 Old myocardial infarction: Secondary | ICD-10-CM | POA: Diagnosis not present

## 2017-06-09 DIAGNOSIS — Z7982 Long term (current) use of aspirin: Secondary | ICD-10-CM | POA: Diagnosis not present

## 2017-06-09 DIAGNOSIS — Z880 Allergy status to penicillin: Secondary | ICD-10-CM | POA: Diagnosis not present

## 2017-06-09 DIAGNOSIS — I420 Dilated cardiomyopathy: Secondary | ICD-10-CM | POA: Diagnosis present

## 2017-06-09 DIAGNOSIS — Z87891 Personal history of nicotine dependence: Secondary | ICD-10-CM | POA: Diagnosis not present

## 2017-06-09 DIAGNOSIS — E875 Hyperkalemia: Secondary | ICD-10-CM | POA: Diagnosis not present

## 2017-06-09 DIAGNOSIS — Z9981 Dependence on supplemental oxygen: Secondary | ICD-10-CM | POA: Diagnosis not present

## 2017-06-09 DIAGNOSIS — M4854XA Collapsed vertebra, not elsewhere classified, thoracic region, initial encounter for fracture: Secondary | ICD-10-CM | POA: Diagnosis present

## 2017-06-09 DIAGNOSIS — T502X5A Adverse effect of carbonic-anhydrase inhibitors, benzothiadiazides and other diuretics, initial encounter: Secondary | ICD-10-CM | POA: Diagnosis present

## 2017-06-09 DIAGNOSIS — I5022 Chronic systolic (congestive) heart failure: Secondary | ICD-10-CM | POA: Diagnosis present

## 2017-06-09 DIAGNOSIS — N183 Chronic kidney disease, stage 3 (moderate): Secondary | ICD-10-CM | POA: Diagnosis present

## 2017-06-09 DIAGNOSIS — E1122 Type 2 diabetes mellitus with diabetic chronic kidney disease: Secondary | ICD-10-CM | POA: Diagnosis present

## 2017-06-09 HISTORY — DX: Hypo-osmolality and hyponatremia: E87.1

## 2017-06-09 LAB — TSH: TSH: 4.838 u[IU]/mL — AB (ref 0.350–4.500)

## 2017-06-09 LAB — CBC
HCT: 35.4 % (ref 35.0–47.0)
Hemoglobin: 12.2 g/dL (ref 12.0–16.0)
MCH: 31.2 pg (ref 26.0–34.0)
MCHC: 34.6 g/dL (ref 32.0–36.0)
MCV: 90 fL (ref 80.0–100.0)
PLATELETS: 245 10*3/uL (ref 150–440)
RBC: 3.93 MIL/uL (ref 3.80–5.20)
RDW: 13.6 % (ref 11.5–14.5)
WBC: 5.8 10*3/uL (ref 3.6–11.0)

## 2017-06-09 LAB — BASIC METABOLIC PANEL
Anion gap: 7 (ref 5–15)
BUN: 36 mg/dL — AB (ref 6–20)
CALCIUM: 9 mg/dL (ref 8.9–10.3)
CHLORIDE: 97 mmol/L — AB (ref 101–111)
CO2: 26 mmol/L (ref 22–32)
CREATININE: 1.78 mg/dL — AB (ref 0.44–1.00)
GFR, EST AFRICAN AMERICAN: 29 mL/min — AB (ref >60)
GFR, EST NON AFRICAN AMERICAN: 25 mL/min — AB (ref >60)
Glucose, Bld: 106 mg/dL — ABNORMAL HIGH (ref 65–99)
Potassium: 4.1 mmol/L (ref 3.5–5.1)
SODIUM: 130 mmol/L — AB (ref 135–145)

## 2017-06-09 LAB — TROPONIN I
TROPONIN I: 0.08 ng/mL — AB (ref <0.03)
TROPONIN I: 0.06 ng/mL — AB (ref <0.03)
TROPONIN I: 0.06 ng/mL — AB (ref <0.03)

## 2017-06-09 LAB — GLUCOSE, CAPILLARY: Glucose-Capillary: 105 mg/dL — ABNORMAL HIGH (ref 65–99)

## 2017-06-09 MED ORDER — BISACODYL 5 MG PO TBEC: 5.0000 mg | DELAYED_RELEASE_TABLET | Freq: Every day | ORAL | Status: DC | PRN

## 2017-06-09 MED ORDER — METHYLPREDNISOLONE 4 MG PO TBPK
8.0000 mg | ORAL_TABLET | Freq: Every morning | ORAL | Status: AC
  Administered 2017-06-09: 8 mg via ORAL
  Filled 2017-06-09 (×2): qty 21

## 2017-06-09 MED ORDER — METHYLPREDNISOLONE 4 MG PO TBPK
4.0000 mg | ORAL_TABLET | ORAL | Status: AC
  Administered 2017-06-09: 4 mg via ORAL

## 2017-06-09 MED ORDER — HYDROCODONE-ACETAMINOPHEN 5-325 MG PO TABS
1.0000 | ORAL_TABLET | ORAL | Status: DC | PRN
  Administered 2017-06-09 – 2017-06-11 (×5): 1 via ORAL
  Administered 2017-06-14: 2 via ORAL
  Administered 2017-06-15: 1 via ORAL
  Filled 2017-06-09 (×6): qty 1
  Filled 2017-06-09: qty 2

## 2017-06-09 MED ORDER — ATORVASTATIN CALCIUM 20 MG PO TABS
80.0000 mg | ORAL_TABLET | Freq: Every day | ORAL | Status: DC
  Administered 2017-06-09 – 2017-06-14 (×6): 80 mg via ORAL
  Filled 2017-06-09 (×6): qty 4

## 2017-06-09 MED ORDER — FUSION PLUS PO CAPS: 1.0000 | ORAL_CAPSULE | Freq: Every day | ORAL | Status: DC

## 2017-06-09 MED ORDER — CARVEDILOL 12.5 MG PO TABS
12.5000 mg | ORAL_TABLET | Freq: Two times a day (BID) | ORAL | Status: DC
  Administered 2017-06-09 – 2017-06-14 (×11): 12.5 mg via ORAL
  Filled 2017-06-09 (×12): qty 1

## 2017-06-09 MED ORDER — TRAZODONE HCL 50 MG PO TABS
25.0000 mg | ORAL_TABLET | Freq: Every evening | ORAL | Status: DC | PRN
  Administered 2017-06-12 – 2017-06-14 (×3): 25 mg via ORAL
  Filled 2017-06-09 (×3): qty 1

## 2017-06-09 MED ORDER — METHYLPREDNISOLONE 4 MG PO TBPK
4.0000 mg | ORAL_TABLET | ORAL | Status: AC
  Administered 2017-06-09: 4 mg via ORAL
  Filled 2017-06-09: qty 21

## 2017-06-09 MED ORDER — METHYLPREDNISOLONE 4 MG PO TBPK
8.0000 mg | ORAL_TABLET | Freq: Every evening | ORAL | Status: AC
  Administered 2017-06-09: 8 mg via ORAL

## 2017-06-09 MED ORDER — METHYLPREDNISOLONE 4 MG PO TBPK
4.0000 mg | ORAL_TABLET | Freq: Four times a day (QID) | ORAL | Status: AC
  Administered 2017-06-11 – 2017-06-14 (×10): 4 mg via ORAL

## 2017-06-09 MED ORDER — SODIUM CHLORIDE 0.9 % IV SOLN: 250.0000 mL | INTRAVENOUS | Status: DC | PRN

## 2017-06-09 MED ORDER — MECLIZINE HCL 25 MG PO TABS
12.5000 mg | ORAL_TABLET | Freq: Two times a day (BID) | ORAL | Status: DC
  Administered 2017-06-09 – 2017-06-15 (×14): 12.5 mg via ORAL
  Filled 2017-06-09 (×16): qty 0.5

## 2017-06-09 MED ORDER — ACETAMINOPHEN 650 MG RE SUPP: 650.0000 mg | Freq: Four times a day (QID) | RECTAL | Status: DC | PRN

## 2017-06-09 MED ORDER — ACETAMINOPHEN 325 MG PO TABS
650.0000 mg | ORAL_TABLET | Freq: Four times a day (QID) | ORAL | Status: DC | PRN
  Administered 2017-06-14 – 2017-06-15 (×3): 650 mg via ORAL
  Filled 2017-06-09 (×3): qty 2

## 2017-06-09 MED ORDER — LEVOTHYROXINE SODIUM 25 MCG PO TABS
25.0000 ug | ORAL_TABLET | Freq: Every day | ORAL | Status: DC
  Administered 2017-06-09 – 2017-06-15 (×7): 25 ug via ORAL
  Filled 2017-06-09 (×7): qty 1

## 2017-06-09 MED ORDER — SODIUM CHLORIDE 0.9% FLUSH: 3.0000 mL | INTRAVENOUS | Status: DC | PRN

## 2017-06-09 MED ORDER — ORAL CARE MOUTH RINSE
15.0000 mL | Freq: Two times a day (BID) | OROMUCOSAL | Status: DC
  Administered 2017-06-09 – 2017-06-15 (×11): 15 mL via OROMUCOSAL

## 2017-06-09 MED ORDER — METHYLPREDNISOLONE 4 MG PO TBPK
4.0000 mg | ORAL_TABLET | Freq: Three times a day (TID) | ORAL | Status: AC
  Administered 2017-06-10 (×3): 4 mg via ORAL

## 2017-06-09 MED ORDER — ONDANSETRON HCL 4 MG/2ML IJ SOLN: 4.0000 mg | Freq: Four times a day (QID) | INTRAMUSCULAR | Status: DC | PRN

## 2017-06-09 MED ORDER — PANTOPRAZOLE SODIUM 40 MG PO TBEC
40.0000 mg | DELAYED_RELEASE_TABLET | Freq: Every day | ORAL | Status: DC
  Administered 2017-06-09 – 2017-06-15 (×7): 40 mg via ORAL
  Filled 2017-06-09 (×7): qty 1

## 2017-06-09 MED ORDER — ONDANSETRON HCL 4 MG PO TABS: 4.0000 mg | ORAL_TABLET | Freq: Four times a day (QID) | ORAL | Status: DC | PRN

## 2017-06-09 MED ORDER — ASPIRIN EC 81 MG PO TBEC
81.0000 mg | DELAYED_RELEASE_TABLET | Freq: Every day | ORAL | Status: DC
  Administered 2017-06-09: 81 mg via ORAL
  Filled 2017-06-09: qty 1

## 2017-06-09 MED ORDER — SODIUM CHLORIDE 0.9 % IV SOLN
INTRAVENOUS | Status: DC
  Administered 2017-06-09: 02:00:00 via INTRAVENOUS

## 2017-06-09 MED ORDER — KETOROLAC TROMETHAMINE 30 MG/ML IJ SOLN: 30.0000 mg | Freq: Four times a day (QID) | INTRAMUSCULAR | Status: DC | PRN

## 2017-06-09 MED ORDER — HEPARIN SODIUM (PORCINE) 5000 UNIT/ML IJ SOLN
5000.0000 [IU] | Freq: Three times a day (TID) | INTRAMUSCULAR | Status: DC
  Administered 2017-06-09: 5000 [IU] via SUBCUTANEOUS
  Filled 2017-06-09: qty 1

## 2017-06-09 MED ORDER — ERGOCALCIFEROL 1.25 MG (50000 UT) PO CAPS
50000.0000 [IU] | ORAL_CAPSULE | ORAL | Status: DC
  Administered 2017-06-09: 50000 [IU] via ORAL
  Filled 2017-06-09 (×2): qty 1

## 2017-06-09 MED ORDER — DOCUSATE SODIUM 100 MG PO CAPS
100.0000 mg | ORAL_CAPSULE | Freq: Two times a day (BID) | ORAL | Status: DC
  Administered 2017-06-09 – 2017-06-15 (×12): 100 mg via ORAL
  Filled 2017-06-09 (×12): qty 1

## 2017-06-09 MED ORDER — KETOROLAC TROMETHAMINE 30 MG/ML IJ SOLN
15.0000 mg | Freq: Four times a day (QID) | INTRAMUSCULAR | Status: AC | PRN
  Administered 2017-06-09 – 2017-06-10 (×3): 15 mg via INTRAVENOUS
  Filled 2017-06-09 (×3): qty 1

## 2017-06-09 MED ORDER — ISOSORBIDE MONONITRATE ER 30 MG PO TB24
30.0000 mg | ORAL_TABLET | Freq: Every day | ORAL | Status: DC
  Administered 2017-06-09 – 2017-06-15 (×6): 30 mg via ORAL
  Filled 2017-06-09 (×7): qty 1

## 2017-06-09 MED ORDER — SODIUM CHLORIDE 0.9% FLUSH
3.0000 mL | Freq: Two times a day (BID) | INTRAVENOUS | Status: DC
  Administered 2017-06-09 – 2017-06-11 (×2): 3 mL via INTRAVENOUS

## 2017-06-09 MED ORDER — ADULT MULTIVITAMIN W/MINERALS CH
1.0000 | ORAL_TABLET | Freq: Every day | ORAL | Status: DC
  Administered 2017-06-09 – 2017-06-15 (×7): 1 via ORAL
  Filled 2017-06-09 (×7): qty 1

## 2017-06-09 MED ORDER — SERTRALINE HCL 50 MG PO TABS
50.0000 mg | ORAL_TABLET | Freq: Every day | ORAL | Status: DC
  Administered 2017-06-09 – 2017-06-15 (×7): 50 mg via ORAL
  Filled 2017-06-09 (×7): qty 1

## 2017-06-09 MED ORDER — METHYLPREDNISOLONE 4 MG PO TBPK
8.0000 mg | ORAL_TABLET | Freq: Every evening | ORAL | Status: AC
  Administered 2017-06-10: 8 mg via ORAL

## 2017-06-09 MED ORDER — POTASSIUM CHLORIDE CRYS ER 10 MEQ PO TBCR
10.0000 meq | EXTENDED_RELEASE_TABLET | Freq: Every day | ORAL | Status: DC
  Administered 2017-06-09: 10 meq via ORAL
  Filled 2017-06-09: qty 1

## 2017-06-09 NOTE — ED Notes (Signed)
Attempted to call report and left on hold for 5 min. Will reattempt in a few minutes

## 2017-06-09 NOTE — Consult Note (Signed)
ORTHOPAEDIC CONSULTATION  PATIENT NAME: Jacqueline [name] DOB: 1932-10-15  MRN: [id number]  REQUESTING PHYSICIAN: Dustin Flock, MD  Chief Complaint: T6 compression fracture  HPI: Jacqueline [name] is a 83 y.o. female who complains of midthoracic back pain. According to the patient this pain has been going on for some time however recently it has worsened. Patient denies any history of fall. She does have history of bending and lifting heavy objects. Patient was admitted to the hospital on December 17 where a CT scan of the chest was done which was negative for any obvious fracture. A CT done on December 25 does show an acute T6 compression fracture. Patient also describes radiation of pain into her anterior chest. Pain is also increased with coughing and sneezing. She denies any radiation of the pain into her lower extremities. Patient has not been officially diagnosed with osteoporosis in the past.  Past Medical History:  Diagnosis Date  . CHF (congestive heart failure) ([place])   . Diabetes mellitus without complication ([place]) 83/15/1761   Currently no high BS.  Has come off meds.  But having episodes of low BS now.  . Dilated idiopathic cardiomyopathy ([place])   . GERD (gastroesophageal reflux disease)   . Hyperlipidemia   . Hypertension   . Hypothyroidism   . Renal disorder    Past Surgical History:  Procedure Laterality Date  . BUNIONECTOMY    . COLONOSCOPY    . TONSILLECTOMY     Social History   Socioeconomic History  . Marital status: Widowed    Spouse name: None  . Number of children: None  . Years of education: None  . Highest education level: None  Social Needs  . Financial resource strain: None  . Food insecurity - worry: None  . Food insecurity - inability: None  . Transportation needs - medical: None  . Transportation needs - non-medical: None  Occupational History  . None  Tobacco Use  . Smoking status: Former Research scientist (life sciences)  . Smokeless tobacco: Never Used  Substance and  Sexual Activity  . Alcohol use: No  . Drug use: No  . Sexual activity: No  Other Topics Concern  . None  Social History Narrative  . None   Family History  Problem Relation Age of Onset  . Premature CHD Brother    Allergies  Allergen Reactions  . Ciprofloxacin   . Digoxin And Related   . Erythromycin   . Lopid [Gemfibrozil]   . Nitrofurantoin   . Cefuroxime Rash  . Penicillins Rash    Has patient had a PCN reaction causing immediate rash, facial/tongue/throat swelling, SOB or lightheadedness with hypotension: Unknown Has patient had a PCN reaction causing severe rash involving mucus membranes or skin necrosis: No Has patient had a PCN reaction that required hospitalization: No Has patient had a PCN reaction occurring within the last 10 years: No If all of the above answers are "NO", then may proceed with Cephalosporin use.    Prior to Admission medications   Medication Sig Start Date End Date Taking? Authorizing Provider  aspirin EC 81 MG tablet Take 81 mg by mouth daily.   Yes [provider]  atorvastatin (LIPITOR) 40 MG tablet Take 2 tablets (80 mg total) by mouth daily at 6 PM. 06/04/17  Yes Sainani, Belia Heman, MD  carvedilol (COREG) 12.5 MG tablet Take 12.5 mg by mouth 2 (two) times daily with a meal.   Yes [provider]  ergocalciferol (VITAMIN D2) 50000 units capsule Take 50,000  Units by mouth once a week.   Yes [provider]  fenofibrate (TRICOR) 145 MG tablet Take 145 mg by mouth daily.   Yes [provider]  furosemide (LASIX) 40 MG tablet Take 20-40 mg by mouth 2 (two) times daily. Take 40 mg in the morning and 20 mg in the evening.   Yes [provider]  Iron-FA-B Cmp-C-Biot-Probiotic (FUSION PLUS) CAPS Take 1 capsule by mouth daily.    Yes [provider]  isosorbide mononitrate (IMDUR) 60 MG 24 hr tablet Take 0.5 tablets (30 mg total) by mouth daily. 06/05/17  Yes Sainani, Belia Heman, MD  levothyroxine  (SYNTHROID, LEVOTHROID) 25 MCG tablet Take 25 mcg by mouth daily before breakfast.   Yes [provider]  meclizine (ANTIVERT) 25 MG tablet Take 12.5 mg by mouth 2 (two) times daily.   Yes [provider]  pantoprazole (PROTONIX) 40 MG tablet Take 40 mg by mouth daily.   Yes [provider]  potassium chloride (K-DUR) 10 MEQ tablet Take 10 mEq by mouth daily.   Yes [provider]  ramipril (ALTACE) 10 MG capsule Take 20 mg by mouth daily.    Yes [provider]  sertraline (ZOLOFT) 50 MG tablet Take 50 mg by mouth daily.   Yes [provider]  traMADol (ULTRAM) 50 MG tablet Take 1 tablet (50 mg total) by mouth every 12 (twelve) hours as needed for moderate pain. 06/05/17  Yes Harvest Dark, MD  glucose blood (FREESTYLE LITE) test strip USE TO CHECK BLOOD SUGARS TWICE A DAY 02/19/16   [provider]   Dg Chest 2 View  Result Date: 06/08/2017 CLINICAL DATA:  Back pain radiating to the ribs EXAM: CHEST  2 VIEW COMPARISON:  Chest radiograph 06/02/2017 FINDINGS: There is unchanged cardiomegaly and calcific aortic atherosclerosis. Pulmonary vascular congestion is unchanged. There is an enchondroma within the proximal left humerus. No focal consolidation, pleural effusion or pneumothorax. There is a compression fracture of a midthoracic vertebral body that is new compared to 06/01/2017. IMPRESSION: 1. New compression fracture of a midthoracic vertebral body since 06/01/2017, in keeping with reported midthoracic back pain. 2. Unchanged pulmonary vascular congestion with cardiomegaly and calcific aortic atherosclerosis (ICD10-I70.0). No overt pulmonary edema. Electronically Signed   By: Ulyses Jarred M.D.   On: 06/08/2017 22:07   Ct Thoracic Spine Wo Contrast  Result Date: 06/09/2017 CLINICAL DATA:  Marked worsening in thoracic spine pain. The patient has a thoracic compression fracture seen on prior plain films the chest. The fracture is  new since 06/01/2017. EXAM: CT THORACIC SPINE WITHOUT CONTRAST TECHNIQUE: Multidetector CT images of the thoracic were obtained using the standard protocol without intravenous contrast. COMPARISON:  PA and lateral chest 06/08/2017.  CT chest 06/01/2017. FINDINGS: Alignment: Maintained. Vertebrae: As seen on the comparison plain films, the patient has a compression fracture of T6 with vertebral body height loss of up to approximately 90%. There is little to no bony retropulsion and the fracture does not involve the posterior elements. No other fracture is identified. No focal bony lesion is seen. Paraspinal and other soft tissues: Extensive atherosclerotic vascular disease is present. Imaged lung parenchyma is clear with emphysematous change noted. Disc levels: T6-7: Shallow disc bulge without stenosis. T8-9:  Shallow central protrusion without stenosis. 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 L1-2: Ossification of the posterior longitudinal ligament narrows the ventral thecal sac. Except as described, intervertebral disc spaces are unremarkable. IMPRESSION: Acute compression  fracture of T6 with vertebral body height loss of up to 90%. No bony retropulsion or involvement of the posterior elements. Mild thoracic spondylosis as described above. Emphysema. Atherosclerosis. Electronically Signed   By: Inge Rise M.D.   On: 06/09/2017 12:20    Positive ROS: All other systems have been reviewed and were otherwise negative with the exception of those mentioned in the HPI and as above.  Physical Exam: General: Well developed, well nourished female seen in no acute distress. HEENT: Atraumatic and normocephalic. Sclera are clear. Extraocular motion is intact. Oropharynx is clear with moist mucosa. Neck: Supple, nontender, good range of motion. No JVD or carotid bruits. Lungs: Clear to auscultation bilaterally. Cardiovascular: Regular rate and rhythm  with normal S1 and S2. No murmurs. No gallops or rubs. Pedal pulses are palpable bilaterally. Homans test is negative bilaterally. No significant pretibial or ankle edema. Abdomen: Soft, nontender, and nondistended. Bowel sounds are present. Skin: No lesions in the area of chief complaint Neurologic: Awake, alert, and oriented. Sensory function is grossly intact. Motor strength is felt to be 5 over 5 bilaterally. No clonus or tremor. Good motor coordination. Lymphatic: No axillary or cervical lymphadenopathy  MUSCULOSKELETAL: Patient is currently comfortable in bed. She is on no acute distress. She does have tenderness to palpation over her midthoracic spine. Her motor strength in both lower extremities in her quadriceps hamstrings discussed soleus and tibialis anterior are bilaterally symmetrical. Sensations are also grossly intact in both lower extremities.  Assessment: 83 years old female with T6 compression fracture possibly osteoporotic.  Plan: 83 years old female with T6 acute compression fracture. I had a detailed discussion with the patient and her grandson who is in the room.  Patient denies any recent mechanism of injury or fall and this fracture is possibly osteoporotic in nature. I discussed the options for continued conservative treatment as well as surgical treatment options including kyphoplasty. I described the procedure in detail as well as the postoperative recovery process. Kyphoplasty does carry the advantage of being able to do a biopsy intraoperatively which would rule out any neoplastic process. Family at this moment is not decided about possible treatment course that they want to take. I talked to the hospitalist also. Dr. Rudene Christians from [facility] will be consulted tomorrow if patient wants to proceed with kyphoplasty.   Enzo Bi, M.D.

## 2017-06-09 NOTE — Plan of Care (Signed)
  Progressing Activity: Risk for activity intolerance will decrease 06/09/2017 1152 - Progressing by Liliane Channel, RN Pain Managment: General experience of comfort will improve 06/09/2017 1152 - Progressing by Liliane Channel, RN Safety: Ability to remain free from injury will improve 06/09/2017 1152 - Progressing by Liliane Channel, RN Cardiac: Ability to achieve and maintain adequate cardiovascular perfusion will improve 06/09/2017 1152 - Progressing by Liliane Channel, RN

## 2017-06-09 NOTE — Progress Notes (Signed)
[facility]                                                                                                                                                                                  Patient Demographics   Kinleigh Nault, is a 83 y.o. female, DOB - 1932/07/12, WSF:[id number]  Admit date - 06/08/2017   Admitting Physician Max Sane, MD  Outpatient Primary MD for the patient is Tracie Harrier, MD   LOS - 0  Subjective: Patient recently hospitalized for CHF and noted to have severe cardiomyopathy was discharged home now comes with back pain as well as hyponatremia Patient states that her back pain is improved   Review of Systems:   CONSTITUTIONAL: No documented fever. No fatigue, weakness. No weight gain, no weight loss.  EYES: No blurry or double vision.  ENT: No tinnitus. No postnasal drip. No redness of the oropharynx.  RESPIRATORY: No cough, no wheeze, no hemoptysis. No dyspnea.  CARDIOVASCULAR: No chest pain. No orthopnea. No palpitations. No syncope.  GASTROINTESTINAL: No nausea, no vomiting or diarrhea. No abdominal pain. No melena or hematochezia.  GENITOURINARY: No dysuria or hematuria.  ENDOCRINE: No polyuria or nocturia. No heat or cold intolerance.  HEMATOLOGY: No anemia. No bruising. No bleeding.  INTEGUMENTARY: No rashes. No lesions.  MUSCULOSKELETAL: Positive back pain NEUROLOGIC: No numbness, tingling, or ataxia. No seizure-type activity.  PSYCHIATRIC: No anxiety. No insomnia. No ADD.    Vitals:   Vitals:   06/09/17 0000 06/09/17 0129 06/09/17 0420 06/09/17 0800  BP: (!) 144/63 (!) 149/60 (!) 147/55 (!) 160/65  Pulse: 73 79 70 77  Resp: (!) 24 18 18 18   Temp:  98 F (36.7 C) 97.6 F (36.4 C)   TempSrc:  Oral    SpO2: 98% 100% 97% 100%  Weight:   131 lb 8 oz (59.6 kg)   Height:        Wt Readings from Last 3 Encounters:  06/09/17 131 lb 8 oz (59.6 kg)  06/05/17 128 lb (58.1 kg)  06/04/17 128 lb (58.1 kg)      Intake/Output Summary (Last 24 hours) at 06/09/2017 1306 Last data filed at 06/09/2017 1251 Gross per 24 hour  Intake 327.67 ml  Output 1375 ml  Net -1047.33 ml    Physical Exam:   GENERAL: Pleasant-appearing in no apparent distress.  HEAD, EYES, EARS, NOSE AND THROAT: Atraumatic, normocephalic. Extraocular muscles are intact. Pupils equal and reactive to light. Sclerae anicteric. No conjunctival injection. No oro-pharyngeal erythema.  NECK: Supple. There is no jugular venous distention. No bruits, no lymphadenopathy, no thyromegaly.  HEART: Regular rate and rhythm,. No murmurs, no rubs, no  clicks.  LUNGS: Clear to auscultation bilaterally. No rales or rhonchi. No wheezes.  ABDOMEN: Soft, flat, nontender, nondistended. Has good bowel sounds. No hepatosplenomegaly appreciated.  EXTREMITIES: No evidence of any cyanosis, clubbing, or peripheral edema.  +2 pedal and radial pulses bilaterally.  NEUROLOGIC: The patient is alert, awake, and oriented x3 with no focal motor or sensory deficits appreciated bilaterally.  SKIN: Moist and warm with no rashes appreciated.  Psych: Not anxious, depressed LN: No inguinal LN enlargement    Antibiotics   Anti-infectives (From admission, onward)   None      Medications   Scheduled Meds: . aspirin EC  81 mg Oral Daily  . atorvastatin  80 mg Oral q1800  . carvedilol  12.5 mg Oral BID WC  . docusate sodium  100 mg Oral BID  . ergocalciferol  50,000 Units Oral Weekly  . heparin  5,000 Units Subcutaneous Q8H  . isosorbide mononitrate  30 mg Oral Daily  . levothyroxine  25 mcg Oral QAC breakfast  . meclizine  12.5 mg Oral BID  . mouth rinse  15 mL Mouth Rinse BID  . multivitamin with minerals  1 tablet Oral Daily  . pantoprazole  40 mg Oral Daily  . potassium chloride  10 mEq Oral Daily  . sertraline  50 mg Oral Daily  . sodium chloride flush  3 mL Intravenous Q12H   Continuous Infusions: . sodium chloride 10 mL/hr at 06/09/17 0147   . sodium chloride     PRN Meds:.sodium chloride, acetaminophen **OR** acetaminophen, bisacodyl, HYDROcodone-acetaminophen, ketorolac, ondansetron **OR** ondansetron (ZOFRAN) IV, sodium chloride flush, traZODone   Data Review:   Micro Results Recent Results (from the past 240 hour(s))  Blood Culture (routine x 2)     Status: None (Preliminary result)   Collection Time: 06/01/17  5:39 PM  Result Value Ref Range Status   Specimen Description BLOOD RIGHT THUMB  Final   Special Requests   Final    BOTTLES DRAWN AEROBIC AND ANAEROBIC Blood Culture adequate volume   Culture   Final    NO GROWTH 5 DAYS Performed at [facility], [address] [place], [place] [address]    Report Status PENDING  Incomplete  Urine culture     Status: Abnormal   Collection Time: 06/01/17  5:39 PM  Result Value Ref Range Status   Specimen Description URINE, RANDOM  Final   Special Requests NONE  Final   Culture >=100,000 COLONIES/mL ESCHERICHIA COLI (A)  Final   Report Status 06/04/2017 FINAL  Final   Organism ID, Bacteria ESCHERICHIA COLI (A)  Final      Susceptibility   Escherichia coli - MIC*    AMPICILLIN >=32 RESISTANT Resistant     CEFAZOLIN <=4 SENSITIVE Sensitive     CEFTRIAXONE <=1 SENSITIVE Sensitive     CIPROFLOXACIN <=0.25 SENSITIVE Sensitive     GENTAMICIN <=1 SENSITIVE Sensitive     IMIPENEM <=0.25 SENSITIVE Sensitive     NITROFURANTOIN <=16 SENSITIVE Sensitive     TRIMETH/SULFA <=20 SENSITIVE Sensitive     AMPICILLIN/SULBACTAM >=32 RESISTANT Resistant     PIP/TAZO <=4 SENSITIVE Sensitive     Extended ESBL NEGATIVE Sensitive     * >=100,000 COLONIES/mL ESCHERICHIA COLI  Blood Culture (routine x 2)     Status: Abnormal   Collection Time: 06/01/17  5:40 PM  Result Value Ref Range Status   Specimen Description BLOOD LAC  Final   Special Requests   Final    BOTTLES DRAWN AEROBIC  AND ANAEROBIC Blood Culture adequate volume   Culture  Setup Time   Final    GRAM POSITIVE  COCCI AEROBIC BOTTLE ONLY CRITICAL RESULT CALLED TO, READ BACK BY AND VERIFIED WITH: JASON ROBBINS @ 1956 ON 06/02/2017 BY CAF    Culture (A)  Final    STAPHYLOCOCCUS SPECIES (COAGULASE NEGATIVE) THE SIGNIFICANCE OF ISOLATING THIS ORGANISM FROM A SINGLE SET OF BLOOD CULTURES WHEN MULTIPLE SETS ARE DRAWN IS UNCERTAIN. PLEASE NOTIFY THE MICROBIOLOGY DEPARTMENT WITHIN ONE WEEK IF SPECIATION AND SENSITIVITIES ARE REQUIRED. Performed at [facility], [place] [address], [place], [place] [address]    Report Status 06/05/2017 FINAL  Final  Blood Culture ID Panel (Reflexed)     Status: Abnormal   Collection Time: 06/01/17  5:40 PM  Result Value Ref Range Status   Enterococcus species NOT DETECTED NOT DETECTED Final   Listeria monocytogenes NOT DETECTED NOT DETECTED Final   Staphylococcus species DETECTED (A) NOT DETECTED Final    Comment: Methicillin (oxacillin) resistant coagulase negative staphylococcus. Possible blood culture contaminant (unless isolated from more than one blood culture draw or clinical case suggests pathogenicity). No antibiotic treatment is indicated for blood  culture contaminants. CRITICAL RESULT CALLED TO, READ BACK BY AND VERIFIED WITH: JASON ROBBINS @ 1956 ON 06/02/2017 BY CAF    Staphylococcus aureus NOT DETECTED NOT DETECTED Final   Methicillin resistance DETECTED (A) NOT DETECTED Final    Comment: CRITICAL RESULT CALLED TO, READ BACK BY AND VERIFIED WITH: JASON ROBBINS @ 1956 ON 06/02/2017 BY CAF    Streptococcus species NOT DETECTED NOT DETECTED Final   Streptococcus agalactiae NOT DETECTED NOT DETECTED Final   Streptococcus pneumoniae NOT DETECTED NOT DETECTED Final   Streptococcus pyogenes NOT DETECTED NOT DETECTED Final   Acinetobacter baumannii NOT DETECTED NOT DETECTED Final   Enterobacteriaceae species NOT DETECTED NOT DETECTED Final   Enterobacter cloacae complex NOT DETECTED NOT DETECTED Final   Escherichia coli NOT DETECTED NOT DETECTED Final    Klebsiella oxytoca NOT DETECTED NOT DETECTED Final   Klebsiella pneumoniae NOT DETECTED NOT DETECTED Final   Proteus species NOT DETECTED NOT DETECTED Final   Serratia marcescens NOT DETECTED NOT DETECTED Final   Haemophilus influenzae NOT DETECTED NOT DETECTED Final   Neisseria meningitidis NOT DETECTED NOT DETECTED Final   Pseudomonas aeruginosa NOT DETECTED NOT DETECTED Final   Candida albicans NOT DETECTED NOT DETECTED Final   Candida glabrata NOT DETECTED NOT DETECTED Final   Candida krusei NOT DETECTED NOT DETECTED Final   Candida parapsilosis NOT DETECTED NOT DETECTED Final   Candida tropicalis NOT DETECTED NOT DETECTED Final  MRSA PCR Screening     Status: None   Collection Time: 06/01/17 10:46 PM  Result Value Ref Range Status   MRSA by PCR NEGATIVE NEGATIVE Final    Comment:        The GeneXpert MRSA Assay (FDA approved for NASAL specimens only), is one component of a comprehensive MRSA colonization surveillance program. It is not intended to diagnose MRSA infection nor to guide or monitor treatment for MRSA infections.     Radiology Reports Dg Chest 2 View  Result Date: 06/08/2017 CLINICAL DATA:  Back pain radiating to the ribs EXAM: CHEST  2 VIEW COMPARISON:  Chest radiograph 06/02/2017 FINDINGS: There is unchanged cardiomegaly and calcific aortic atherosclerosis. Pulmonary vascular congestion is unchanged. There is an enchondroma within the proximal left humerus. No focal consolidation, pleural effusion or pneumothorax. There is a compression fracture of a midthoracic vertebral body that is  new compared to 06/01/2017. IMPRESSION: 1. New compression fracture of a midthoracic vertebral body since 06/01/2017, in keeping with reported midthoracic back pain. 2. Unchanged pulmonary vascular congestion with cardiomegaly and calcific aortic atherosclerosis (ICD10-I70.0). No overt pulmonary edema. Electronically Signed   By: Ulyses Jarred M.D.   On: 06/08/2017 22:07   Ct Angio  Chest Pe W And/or Wo Contrast  Result Date: 06/01/2017 CLINICAL DATA:  Short of breath EXAM: CT ANGIOGRAPHY CHEST WITH CONTRAST TECHNIQUE: Multidetector CT imaging of the chest was performed using the standard protocol during bolus administration of intravenous contrast. Multiplanar CT image reconstructions and MIPs were obtained to evaluate the vascular anatomy. CONTRAST:  69mL ISOVUE-370 IOPAMIDOL (ISOVUE-370) INJECTION 76% COMPARISON:  Chest 06/01/2017 FINDINGS: Cardiovascular: Negative for pulmonary embolism. Pulmonary artery enlargement bilaterally compatible with pulmonary artery hypertension Extensive atherosclerotic calcification aortic arch and coronary artery. Heart size within normal limits. No pericardial effusion Mediastinum/Nodes: Negative for mass or adenopathy Lungs/Pleura: Scarring right medial upper lobe. Chronic lung disease. Negative for infiltrate or effusion. Partially calcified nodule right lung base unchanged from prior PET-CT 03/13/2017 Upper Abdomen: Cirrhosis of the liver. No ascites. Spleen not enlarged Musculoskeletal: Negative Review of the MIP images confirms the above findings. IMPRESSION: Negative for pulmonary embolism.  Pulmonary artery hypertension Diffuse atherosclerotic disease including the coronary artery. Cirrhosis Aortic Atherosclerosis (ICD10-I70.0). Electronically Signed   By: Franchot Gallo M.D.   On: 06/01/2017 21:02   Ct Thoracic Spine Wo Contrast  Result Date: 06/09/2017 CLINICAL DATA:  Marked worsening in thoracic spine pain. The patient has a thoracic compression fracture seen on prior plain films the chest. The fracture is new since 06/01/2017. EXAM: CT THORACIC SPINE WITHOUT CONTRAST TECHNIQUE: Multidetector CT images of the thoracic were obtained using the standard protocol without intravenous contrast. COMPARISON:  PA and lateral chest 06/08/2017.  CT chest 06/01/2017. FINDINGS: Alignment: Maintained. Vertebrae: As seen on the comparison plain films, the  patient has a compression fracture of T6 with vertebral body height loss of up to approximately 90%. There is little to no bony retropulsion and the fracture does not involve the posterior elements. No other fracture is identified. No focal bony lesion is seen. Paraspinal and other soft tissues: Extensive atherosclerotic vascular disease is present. Imaged lung parenchyma is clear with emphysematous change noted. Disc levels: T6-7: Shallow disc bulge without stenosis. T8-9:  Shallow central protrusion without stenosis. 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 L1-2: Ossification of the posterior longitudinal ligament narrows the ventral thecal sac. Except as described, intervertebral disc spaces are unremarkable. IMPRESSION: Acute compression fracture of T6 with vertebral body height loss of up to 90%. No bony retropulsion or involvement of the posterior elements. Mild thoracic spondylosis as described above. Emphysema. Atherosclerosis. Electronically Signed   By: Inge Rise M.D.   On: 06/09/2017 12:20   Ct Abdomen Pelvis W Contrast  Result Date: 06/05/2017 CLINICAL DATA:  Bilateral back pain radiating to anterior abdomen with nausea and vomiting as well as diarrhea 4 days. EXAM: CT ABDOMEN AND PELVIS WITH CONTRAST TECHNIQUE: Multidetector CT imaging of the abdomen and pelvis was performed using the standard protocol following bolus administration of intravenous contrast. CONTRAST:  43mL ISOVUE-300 IOPAMIDOL (ISOVUE-300) INJECTION 61% COMPARISON:  03/04/2017 and 02/24/2017 FINDINGS: Lower chest: 6 mm nodule over the medial right lower lobe unchanged. Scarring with mild nodularity over the posterolateral right lower lobe without significant change. Calcification of the right coronary artery and adjacent the mitral valve annulus. Mild prominence of  the heart. Hepatobiliary: Nodular contour to the liver with stable hypodense nodules over the right  lobe previously evaluated by a PET-CT and found to be non hypermetabolic. Gallbladder and biliary tree are normal. Pancreas: Normal. Spleen: Subcentimeter hypodensity over the inferior aspect of the spleen unchanged likely a cyst or hemangioma. Adrenals/Urinary Tract: Adrenal glands are normal. Kidneys are normal in size without hydronephrosis or nephrolithiasis. Ureters are within normal. Bladder is normal. Stomach/Bowel: Stomach and small bowel are normal. The appendix is normal. Colon is unremarkable. Vascular/Lymphatic: There is mild calcified plaque over the abdominal aorta and iliac arteries. No adenopathy. Reproductive: Within normal. Other: No free fluid or focal inflammatory change. Musculoskeletal: Mild degenerate change of the spine and hips. IMPRESSION: No acute findings in the abdomen/pelvis. Evidence of known cirrhosis with stable right lobe nodules. Scar in the right base. 6 mm nodule over the right lower lobe unchanged. Recommend followup CT 6 months. This recommendation follows the consensus statement: Guidelines for Management of Small Pulmonary Nodules Detected on CT Scans: A Statement from the [place] as published in Radiology 2005; 237:395-400. Online at: https://www.arnold.com/. Aortic Atherosclerosis (ICD10-I70.0). Electronically Signed   By: Marin Olp M.D.   On: 06/05/2017 18:21   Portable Chest X-ray 1 View  Result Date: 06/02/2017 CLINICAL DATA:  CHF. EXAM: PORTABLE CHEST 1 VIEW COMPARISON:  06/01/2017 chest radiograph and CTA FINDINGS: The cardiac silhouette is borderline enlarged. Aortic atherosclerosis is noted. There is central pulmonary artery enlargement as described on CT. Slight interstitial prominence is similar to the prior study without overt alveolar edema. No sizable pleural effusion or pneumothorax is seen. A sclerotic lesion is again partially visualized in the proximal left humerus, benign in appearance. IMPRESSION: Mild  pulmonary vascular congestion without overt edema. Electronically Signed   By: Logan Bores M.D.   On: 06/02/2017 09:16   Dg Chest Port 1 View  Result Date: 06/01/2017 CLINICAL DATA:  Short of breath EXAM: PORTABLE CHEST 1 VIEW COMPARISON:  03/02/2013 FINDINGS: Cardiac enlargement with mild vascular congestion. Negative for edema or effusion. Mild bibasilar atelectasis. Sclerotic benign appearing lesion proximal left humerus unchanged IMPRESSION: Mild bibasilar atelectasis. Pulmonary vascular congestion Atherosclerotic aorta Electronically Signed   By: Franchot Gallo M.D.   On: 06/01/2017 18:39     CBC Recent Labs  Lab 06/03/17 0638 06/04/17 0330 06/05/17 1654 06/08/17 2127 06/09/17 0328  WBC 6.2 4.8 5.5 8.1 5.8  HGB 11.3* 11.4* 12.2 13.0 12.2  HCT 33.5* 34.1* 36.3 38.6 35.4  PLT 173 177 213 277 245  MCV 92.0 92.4 91.2 90.9 90.0  MCH 31.0 31.0 30.7 30.7 31.2  MCHC 33.7 33.5 33.6 33.7 34.6  RDW 12.9 13.2 13.2 13.3 13.6    Chemistries  Recent Labs  Lab 06/03/17 0638 06/04/17 0330 06/05/17 1654 06/08/17 2127 06/09/17 0328  NA 134* 133* 133* 127* 130*  K 3.6 3.8 4.7 4.9 4.1  CL 102 98* 100* 92* 97*  CO2 24 27 25 27 26   GLUCOSE 122* 99 121* 137* 106*  BUN 39* 34* 43* 39* 36*  CREATININE 1.20* 1.28* 1.52* 2.16* 1.78*  CALCIUM 8.7* 9.0 9.2 9.4 9.0  AST  --   --   --  40  --   ALT  --   --   --  19  --   ALKPHOS  --   --   --  45  --   BILITOT  --   --   --  0.9  --    ------------------------------------------------------------------------------------------------------------------ estimated creatinine  clearance is 18.6 mL/min (A) (by C-G formula based on SCr of 1.78 mg/dL (H)). ------------------------------------------------------------------------------------------------------------------ No results for input(s): HGBA1C in the last 72 hours. ------------------------------------------------------------------------------------------------------------------ No results for  input(s): CHOL, HDL, LDLCALC, TRIG, CHOLHDL, LDLDIRECT in the last 72 hours. ------------------------------------------------------------------------------------------------------------------ Recent Labs    06/09/17 0328  TSH 4.838*   ------------------------------------------------------------------------------------------------------------------ No results for input(s): VITAMINB12, FOLATE, FERRITIN, TIBC, IRON, RETICCTPCT in the last 72 hours.  Coagulation profile No results for input(s): INR, PROTIME in the last 168 hours.  No results for input(s): DDIMER in the last 72 hours.  Cardiac Enzymes Recent Labs  Lab 06/08/17 2127 06/09/17 0328 06/09/17 0924  TROPONINI 0.06* 0.08* 0.06*   ------------------------------------------------------------------------------------------------------------------ Invalid input(s): POCBNP    Assessment & Plan   83 year old female with a known history of chronic systolic CHF, last EF of 44-01%, had an NSTEMI a week ago now is being admitted for new compression fracture of mid thoracic vertebral body and severe hyponatremia  *New compression fracture mid thoracic vertebral body -no reported fall -Patient very symptomatic I will start her on Medrol dose pack -CT of her thoracic spine -I will cancel neurosurgery consult and have orthopedic surgery see the patient she may be able to have some sort of vertebroplasty if symptoms persist   *Severe hyponatremia -Likely from overdiuresis -Sodium improved with gentle hydration Lasix on hold  *Acute on chronic kidney disease stage III -Likely due to overdiuresis -improved with ivf -Monitor kidney function hold off ACE inhibitor and Lasix  *Chronic systolic CHF -EF of 02-72% -Well compensated at this time -Monitor ON telemetry       Code Status Orders  (From admission, onward)        Start     Ordered   06/09/17 0008  Full code  Continuous     06/09/17 0008    Code Status  History    Date Active Date Inactive Code Status Order ID Comments User Context   06/01/2017 23:10 06/04/2017 18:03 DNR [id number]  Gorden Harms, MD Inpatient   02/25/2017 00:00 02/26/2017 02:45 Full Code [id number]  Lance Coon, MD ED   02/18/2017 16:20 02/20/2017 18:54 Full Code [id number]  Hillary Bow, MD ED    Advance Directive Documentation     Most Recent Value  Type of Advance Directive  Living will  Pre-existing out of facility DNR order (yellow form or pink MOST form)  No data  "MOST" Form in Place?  No data           Consults orthopedics  DVT Prophylaxis discontinue heparin for possible procedure  Lab Results  Component Value Date   PLT 245 06/09/2017     Time Spent in minutes 35 minutes greater than 50% of time spent in care coordination and counseling patient regarding the condition and plan of care.   Dustin Flock M.D on 06/09/2017 at 1:06 PM  Between 7am to 6pm - Pager - [phone]  After 6pm go to [email or web address] - password [email or web address] [facility]   Office  [phone]

## 2017-06-09 NOTE — Plan of Care (Signed)
  Progressing Education: Knowledge of General Education information will improve 06/09/2017 [phone] - Progressing by Loran Senters, RN Pain Managment: General experience of comfort will improve 06/09/2017 [phone] - Progressing by Loran Senters, RN Safety: Ability to remain free from injury will improve 06/09/2017 0613 - Progressing by Loran Senters, RN

## 2017-06-10 LAB — BASIC METABOLIC PANEL
Anion gap: 6 (ref 5–15)
BUN: 46 mg/dL — AB (ref 6–20)
CALCIUM: 8.8 mg/dL — AB (ref 8.9–10.3)
CHLORIDE: 98 mmol/L — AB (ref 101–111)
CO2: 25 mmol/L (ref 22–32)
CREATININE: 1.7 mg/dL — AB (ref 0.44–1.00)
GFR calc non Af Amer: 27 mL/min — ABNORMAL LOW (ref >60)
GFR, EST AFRICAN AMERICAN: 31 mL/min — AB (ref >60)
GLUCOSE: 138 mg/dL — AB (ref 65–99)
Potassium: 5.4 mmol/L — ABNORMAL HIGH (ref 3.5–5.1)
Sodium: 129 mmol/L — ABNORMAL LOW (ref 135–145)

## 2017-06-10 LAB — GLUCOSE, CAPILLARY
GLUCOSE-CAPILLARY: 130 mg/dL — AB (ref 65–99)
Glucose-Capillary: 126 mg/dL — ABNORMAL HIGH (ref 65–99)
Glucose-Capillary: 129 mg/dL — ABNORMAL HIGH (ref 65–99)

## 2017-06-10 LAB — MAGNESIUM: Magnesium: 2.1 mg/dL (ref 1.7–2.4)

## 2017-06-10 LAB — URINE CULTURE: Culture: NO GROWTH

## 2017-06-10 NOTE — Progress Notes (Signed)
Doctor Vianne Bulls ordered to check the Magnesium level of the pt ( for the episode of 4 beats of V-tach). Will continue to monitor.

## 2017-06-10 NOTE — Care Management Note (Signed)
Case Management Note  Patient Details  Name: Jacqueline [name] MRN: [id number] Date of Birth: Oct 04, 1932  Subjective/Objective:                 Patient currently open to [facility] after recent discharge home from [facility] 06/04/2017.  New home oxygen provided by [facility].  Her chest xray this admission show new compression fracture and family is considering whether to pursue kyphoplasty.   Action/Plan:   Patient has not been able to participate with physical therapy or occupational therapy due to elevated potassium.  [facility] informed of admission.  Expected Discharge Date:                  Expected Discharge Plan:     In-House Referral:     Discharge planning Services     Post Acute Care Choice:    Choice offered to:     DME Arranged:    DME Agency:     HH Arranged:    HH Agency:     Status of Service:     If discussed at [employer], dates discussed:    Additional Comments:  Katrina Stack, RN 06/10/2017, 2:17 PM

## 2017-06-10 NOTE — Progress Notes (Signed)
Pt complained of back pain 8 out of around 1126. PRN Hydrocodone 1 tablet was given. Will continue to monitor.

## 2017-06-10 NOTE — Progress Notes (Signed)
Magnesium Level was at 2.1 and Doctor Vianne Bulls was notified. No order was placed and only states to give scheduled coreg of pt at 1700. Will continue to monitor.

## 2017-06-10 NOTE — Progress Notes (Signed)
PT Cancellation Note  Patient Details Name: Jacqueline [name] MRN: [id number] DOB: 1933-01-28   Cancelled Treatment:    Reason Eval/Treat Not Completed: Patient not medically ready.  PT consult received.  Chart reviewed.  Pt's potassium noted to be elevated to 5.4 today.  Per PT guidelines for elevated potassium, will hold PT at this time and re-attempt PT evaluation at a later date/time as medically appropriate.  Leitha Bleak, PT 06/10/17, 1:20 PM [phone]

## 2017-06-10 NOTE — Progress Notes (Signed)
[place] at [facility]                                                                                                                                                                                  Patient Demographics   Jacqueline [name], is a 83 y.o. female, DOB - 04/09/1933, ELF:[id number]  Admit date - 06/08/2017   Admitting Physician Max Sane, MD  Outpatient Primary MD for the patient is Tracie Harrier, MD   LOS - 1  Subjective: Still has upper back  back pain but better than yesterday.  Review of Systems:   CONSTITUTIONAL: No documented fever. No fatigue, weakness. No weight gain, no weight loss.  EYES: No blurry or double vision.  ENT: No tinnitus. No postnasal drip. No redness of the oropharynx.  RESPIRATORY: No cough, no wheeze, no hemoptysis. No dyspnea.  CARDIOVASCULAR: No chest pain. No orthopnea. No palpitations. No syncope.  GASTROINTESTINAL: No nausea, no vomiting or diarrhea. No abdominal pain. No melena or hematochezia.  GENITOURINARY: No dysuria or hematuria.  ENDOCRINE: No polyuria or nocturia. No heat or cold intolerance.  HEMATOLOGY: No anemia. No bruising. No bleeding.  INTEGUMENTARY: No rashes. No lesions.  MUSCULOSKELETAL: Positive back pain NEUROLOGIC: No numbness, tingling, or ataxia. No seizure-type activity.  PSYCHIATRIC: No anxiety. No insomnia. No ADD.    Vitals:   Vitals:   06/09/17 1946 06/10/17 0332 06/10/17 0757 06/10/17 0945  BP: (!) 108/55 (!) 148/57 (!) 157/62 (!) 100/50  Pulse:  68 72   Resp:  19 18   Temp:  98 F (36.7 C) 97.7 F (36.5 C)   TempSrc:   Oral   SpO2:  99% 100%   Weight:  57 kg (125 lb 9.6 oz)    Height:        Wt Readings from Last 3 Encounters:  06/10/17 57 kg (125 lb 9.6 oz)  06/05/17 58.1 kg (128 lb)  06/04/17 58.1 kg (128 lb)     Intake/Output Summary (Last 24 hours) at 06/10/2017 1226 Last data filed at 06/10/2017 1200 Gross per 24 hour  Intake 974.5 ml  Output 1050 ml   Net -75.5 ml    Physical Exam:   GENERAL: Pleasant-appearing in no apparent distress.  HEAD, EYES, EARS, NOSE AND THROAT: Atraumatic, normocephalic. Extraocular muscles are intact. Pupils equal and reactive to light. Sclerae anicteric. No conjunctival injection. No oro-pharyngeal erythema.  NECK: Supple. There is no jugular venous distention. No bruits, no lymphadenopathy, no thyromegaly.  HEART: Regular rate and rhythm,. No murmurs, no rubs, no clicks.  LUNGS: Clear to auscultation bilaterally. No rales or rhonchi. No wheezes.  ABDOMEN: Soft, flat, nontender, nondistended. Has good bowel  sounds. No hepatosplenomegaly appreciated.  EXTREMITIES: No evidence of any cyanosis, clubbing, or peripheral edema.  +2 pedal and radial pulses bilaterally.  NEUROLOGIC: The patient is alert, awake, and oriented x3 with no focal motor or sensory deficits appreciated bilaterally.  SKIN: Moist and warm with no rashes appreciated.  Psych: Not anxious, depressed LN: No inguinal LN enlargement    Antibiotics   Anti-infectives (From admission, onward)   None      Medications   Scheduled Meds: . atorvastatin  80 mg Oral q1800  . carvedilol  12.5 mg Oral BID WC  . docusate sodium  100 mg Oral BID  . ergocalciferol  50,000 Units Oral Weekly  . isosorbide mononitrate  30 mg Oral Daily  . levothyroxine  25 mcg Oral QAC breakfast  . meclizine  12.5 mg Oral BID  . mouth rinse  15 mL Mouth Rinse BID  . methylPREDNISolone  4 mg Oral 3 x daily with food  . [START ON 06/11/2017] methylPREDNISolone  4 mg Oral 4X daily taper  . methylPREDNISolone  8 mg Oral Nightly  . multivitamin with minerals  1 tablet Oral Daily  . pantoprazole  40 mg Oral Daily  . sertraline  50 mg Oral Daily  . sodium chloride flush  3 mL Intravenous Q12H   Continuous Infusions: . sodium chloride 10 mL/hr at 06/09/17 0147  . sodium chloride     PRN Meds:.sodium chloride, acetaminophen **OR** acetaminophen, bisacodyl,  HYDROcodone-acetaminophen, ketorolac, ondansetron **OR** ondansetron (ZOFRAN) IV, sodium chloride flush, traZODone   Data Review:   Micro Results Recent Results (from the past 240 hour(s))  Blood Culture (routine x 2)     Status: None (Preliminary result)   Collection Time: 06/01/17  5:39 PM  Result Value Ref Range Status   Specimen Description BLOOD RIGHT THUMB  Final   Special Requests   Final    BOTTLES DRAWN AEROBIC AND ANAEROBIC Blood Culture adequate volume   Culture   Final    NO GROWTH 5 DAYS Performed at [facility], [address] [place], [place] [address]    Report Status PENDING  Incomplete  Urine culture     Status: Abnormal   Collection Time: 06/01/17  5:39 PM  Result Value Ref Range Status   Specimen Description URINE, RANDOM  Final   Special Requests NONE  Final   Culture >=100,000 COLONIES/mL ESCHERICHIA COLI (A)  Final   Report Status 06/04/2017 FINAL  Final   Organism ID, Bacteria ESCHERICHIA COLI (A)  Final      Susceptibility   Escherichia coli - MIC*    AMPICILLIN >=32 RESISTANT Resistant     CEFAZOLIN <=4 SENSITIVE Sensitive     CEFTRIAXONE <=1 SENSITIVE Sensitive     CIPROFLOXACIN <=0.25 SENSITIVE Sensitive     GENTAMICIN <=1 SENSITIVE Sensitive     IMIPENEM <=0.25 SENSITIVE Sensitive     NITROFURANTOIN <=16 SENSITIVE Sensitive     TRIMETH/SULFA <=20 SENSITIVE Sensitive     AMPICILLIN/SULBACTAM >=32 RESISTANT Resistant     PIP/TAZO <=4 SENSITIVE Sensitive     Extended ESBL NEGATIVE Sensitive     * >=100,000 COLONIES/mL ESCHERICHIA COLI  Blood Culture (routine x 2)     Status: Abnormal   Collection Time: 06/01/17  5:40 PM  Result Value Ref Range Status   Specimen Description BLOOD LAC  Final   Special Requests   Final    BOTTLES DRAWN AEROBIC AND ANAEROBIC Blood Culture adequate volume   Culture  Setup Time   Final  GRAM POSITIVE COCCI AEROBIC BOTTLE ONLY CRITICAL RESULT CALLED TO, READ BACK BY AND VERIFIED WITH: JASON ROBBINS @  1956 ON 06/02/2017 BY CAF    Culture (A)  Final    STAPHYLOCOCCUS SPECIES (COAGULASE NEGATIVE) THE SIGNIFICANCE OF ISOLATING THIS ORGANISM FROM A SINGLE SET OF BLOOD CULTURES WHEN MULTIPLE SETS ARE DRAWN IS UNCERTAIN. PLEASE NOTIFY THE MICROBIOLOGY DEPARTMENT WITHIN ONE WEEK IF SPECIATION AND SENSITIVITIES ARE REQUIRED. Performed at [facility], [address] [place], [place] [address]    Report Status 06/05/2017 FINAL  Final  Blood Culture ID Panel (Reflexed)     Status: Abnormal   Collection Time: 06/01/17  5:40 PM  Result Value Ref Range Status   Enterococcus species NOT DETECTED NOT DETECTED Final   Listeria monocytogenes NOT DETECTED NOT DETECTED Final   Staphylococcus species DETECTED (A) NOT DETECTED Final    Comment: Methicillin (oxacillin) resistant coagulase negative staphylococcus. Possible blood culture contaminant (unless isolated from more than one blood culture draw or clinical case suggests pathogenicity). No antibiotic treatment is indicated for blood  culture contaminants. CRITICAL RESULT CALLED TO, READ BACK BY AND VERIFIED WITH: JASON ROBBINS @ 1956 ON 06/02/2017 BY CAF    Staphylococcus aureus NOT DETECTED NOT DETECTED Final   Methicillin resistance DETECTED (A) NOT DETECTED Final    Comment: CRITICAL RESULT CALLED TO, READ BACK BY AND VERIFIED WITH: JASON ROBBINS @ 1956 ON 06/02/2017 BY CAF    Streptococcus species NOT DETECTED NOT DETECTED Final   Streptococcus agalactiae NOT DETECTED NOT DETECTED Final   Streptococcus pneumoniae NOT DETECTED NOT DETECTED Final   Streptococcus pyogenes NOT DETECTED NOT DETECTED Final   Acinetobacter baumannii NOT DETECTED NOT DETECTED Final   Enterobacteriaceae species NOT DETECTED NOT DETECTED Final   Enterobacter cloacae complex NOT DETECTED NOT DETECTED Final   Escherichia coli NOT DETECTED NOT DETECTED Final   Klebsiella oxytoca NOT DETECTED NOT DETECTED Final   Klebsiella pneumoniae NOT DETECTED NOT DETECTED Final    Proteus species NOT DETECTED NOT DETECTED Final   Serratia marcescens NOT DETECTED NOT DETECTED Final   Haemophilus influenzae NOT DETECTED NOT DETECTED Final   Neisseria meningitidis NOT DETECTED NOT DETECTED Final   Pseudomonas aeruginosa NOT DETECTED NOT DETECTED Final   Candida albicans NOT DETECTED NOT DETECTED Final   Candida glabrata NOT DETECTED NOT DETECTED Final   Candida krusei NOT DETECTED NOT DETECTED Final   Candida parapsilosis NOT DETECTED NOT DETECTED Final   Candida tropicalis NOT DETECTED NOT DETECTED Final  MRSA PCR Screening     Status: None   Collection Time: 06/01/17 10:46 PM  Result Value Ref Range Status   MRSA by PCR NEGATIVE NEGATIVE Final    Comment:        The GeneXpert MRSA Assay (FDA approved for NASAL specimens only), is one component of a comprehensive MRSA colonization surveillance program. It is not intended to diagnose MRSA infection nor to guide or monitor treatment for MRSA infections.   Urine culture     Status: None   Collection Time: 06/08/17  9:59 PM  Result Value Ref Range Status   Specimen Description   Final    URINE, RANDOM Performed at [facility], [address], [place], [place] [address]    Special Requests   Final    NONE Performed at [facility], [address], [place], [place] [address]    Culture   Final    NO GROWTH Performed at [facility], [place]  [address], [place], [place] [address]    Report Status 06/10/2017 FINAL  Final    Radiology Reports Dg Chest 2 View  Result Date: 06/08/2017 CLINICAL DATA:  Back pain radiating to the ribs EXAM: CHEST  2 VIEW COMPARISON:  Chest radiograph 06/02/2017 FINDINGS: There is unchanged cardiomegaly and calcific aortic atherosclerosis. Pulmonary vascular congestion is unchanged. There is an enchondroma within the proximal left humerus. No focal consolidation, pleural effusion or pneumothorax. There is a compression fracture of a midthoracic vertebral  body that is new compared to 06/01/2017. IMPRESSION: 1. New compression fracture of a midthoracic vertebral body since 06/01/2017, in keeping with reported midthoracic back pain. 2. Unchanged pulmonary vascular congestion with cardiomegaly and calcific aortic atherosclerosis (ICD10-I70.0). No overt pulmonary edema. Electronically Signed   By: Ulyses Jarred M.D.   On: 06/08/2017 22:07   Ct Angio Chest Pe W And/or Wo Contrast  Result Date: 06/01/2017 CLINICAL DATA:  Short of breath EXAM: CT ANGIOGRAPHY CHEST WITH CONTRAST TECHNIQUE: Multidetector CT imaging of the chest was performed using the standard protocol during bolus administration of intravenous contrast. Multiplanar CT image reconstructions and MIPs were obtained to evaluate the vascular anatomy. CONTRAST:  9mL ISOVUE-370 IOPAMIDOL (ISOVUE-370) INJECTION 76% COMPARISON:  Chest 06/01/2017 FINDINGS: Cardiovascular: Negative for pulmonary embolism. Pulmonary artery enlargement bilaterally compatible with pulmonary artery hypertension Extensive atherosclerotic calcification aortic arch and coronary artery. Heart size within normal limits. No pericardial effusion Mediastinum/Nodes: Negative for mass or adenopathy Lungs/Pleura: Scarring right medial upper lobe. Chronic lung disease. Negative for infiltrate or effusion. Partially calcified nodule right lung base unchanged from prior PET-CT 03/13/2017 Upper Abdomen: Cirrhosis of the liver. No ascites. Spleen not enlarged Musculoskeletal: Negative Review of the MIP images confirms the above findings. IMPRESSION: Negative for pulmonary embolism.  Pulmonary artery hypertension Diffuse atherosclerotic disease including the coronary artery. Cirrhosis Aortic Atherosclerosis (ICD10-I70.0). Electronically Signed   By: Franchot Gallo M.D.   On: 06/01/2017 21:02   Ct Thoracic Spine Wo Contrast  Result Date: 06/09/2017 CLINICAL DATA:  Marked worsening in thoracic spine pain. The patient has a thoracic compression  fracture seen on prior plain films the chest. The fracture is new since 06/01/2017. EXAM: CT THORACIC SPINE WITHOUT CONTRAST TECHNIQUE: Multidetector CT images of the thoracic were obtained using the standard protocol without intravenous contrast. COMPARISON:  PA and lateral chest 06/08/2017.  CT chest 06/01/2017. FINDINGS: Alignment: Maintained. Vertebrae: As seen on the comparison plain films, the patient has a compression fracture of T6 with vertebral body height loss of up to approximately 90%. There is little to no bony retropulsion and the fracture does not involve the posterior elements. No other fracture is identified. No focal bony lesion is seen. Paraspinal and other soft tissues: Extensive atherosclerotic vascular disease is present. Imaged lung parenchyma is clear with emphysematous change noted. Disc levels: T6-7: Shallow disc bulge without stenosis. T8-9:  Shallow central protrusion without stenosis. 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 L1-2: Ossification of the posterior longitudinal ligament narrows the ventral thecal sac. Except as described, intervertebral disc spaces are unremarkable. IMPRESSION: Acute compression fracture of T6 with vertebral body height loss of up to 90%. No bony retropulsion or involvement of the posterior elements. Mild thoracic spondylosis as described above. Emphysema. Atherosclerosis. Electronically Signed   By: Inge Rise M.D.   On: 06/09/2017 12:20   Ct Abdomen Pelvis W Contrast  Result Date: 06/05/2017 CLINICAL DATA:  Bilateral back pain radiating to anterior abdomen with nausea and vomiting as  well as diarrhea 4 days. EXAM: CT ABDOMEN AND PELVIS WITH CONTRAST TECHNIQUE: Multidetector CT imaging of the abdomen and pelvis was performed using the standard protocol following bolus administration of intravenous contrast. CONTRAST:  62mL ISOVUE-300 IOPAMIDOL (ISOVUE-300) INJECTION 61% COMPARISON:   03/04/2017 and 02/24/2017 FINDINGS: Lower chest: 6 mm nodule over the medial right lower lobe unchanged. Scarring with mild nodularity over the posterolateral right lower lobe without significant change. Calcification of the right coronary artery and adjacent the mitral valve annulus. Mild prominence of the heart. Hepatobiliary: Nodular contour to the liver with stable hypodense nodules over the right lobe previously evaluated by a PET-CT and found to be non hypermetabolic. Gallbladder and biliary tree are normal. Pancreas: Normal. Spleen: Subcentimeter hypodensity over the inferior aspect of the spleen unchanged likely a cyst or hemangioma. Adrenals/Urinary Tract: Adrenal glands are normal. Kidneys are normal in size without hydronephrosis or nephrolithiasis. Ureters are within normal. Bladder is normal. Stomach/Bowel: Stomach and small bowel are normal. The appendix is normal. Colon is unremarkable. Vascular/Lymphatic: There is mild calcified plaque over the abdominal aorta and iliac arteries. No adenopathy. Reproductive: Within normal. Other: No free fluid or focal inflammatory change. Musculoskeletal: Mild degenerate change of the spine and hips. IMPRESSION: No acute findings in the abdomen/pelvis. Evidence of known cirrhosis with stable right lobe nodules. Scar in the right base. 6 mm nodule over the right lower lobe unchanged. Recommend followup CT 6 months. This recommendation follows the consensus statement: Guidelines for Management of Small Pulmonary Nodules Detected on CT Scans: A Statement from the [employer] as published in Radiology 2005; 237:395-400. Online at: https://www.arnold.com/. Aortic Atherosclerosis (ICD10-I70.0). Electronically Signed   By: Marin Olp M.D.   On: 06/05/2017 18:21   Portable Chest X-ray 1 View  Result Date: 06/02/2017 CLINICAL DATA:  CHF. EXAM: PORTABLE CHEST 1 VIEW COMPARISON:  06/01/2017 chest radiograph and CTA FINDINGS: The  cardiac silhouette is borderline enlarged. Aortic atherosclerosis is noted. There is central pulmonary artery enlargement as described on CT. Slight interstitial prominence is similar to the prior study without overt alveolar edema. No sizable pleural effusion or pneumothorax is seen. A sclerotic lesion is again partially visualized in the proximal left humerus, benign in appearance. IMPRESSION: Mild pulmonary vascular congestion without overt edema. Electronically Signed   By: Logan Bores M.D.   On: 06/02/2017 09:16   Dg Chest Port 1 View  Result Date: 06/01/2017 CLINICAL DATA:  Short of breath EXAM: PORTABLE CHEST 1 VIEW COMPARISON:  03/02/2013 FINDINGS: Cardiac enlargement with mild vascular congestion. Negative for edema or effusion. Mild bibasilar atelectasis. Sclerotic benign appearing lesion proximal left humerus unchanged IMPRESSION: Mild bibasilar atelectasis. Pulmonary vascular congestion Atherosclerotic aorta Electronically Signed   By: Franchot Gallo M.D.   On: 06/01/2017 18:39     CBC Recent Labs  Lab 06/04/17 0330 06/05/17 1654 06/08/17 2127 06/09/17 0328  WBC 4.8 5.5 8.1 5.8  HGB 11.4* 12.2 13.0 12.2  HCT 34.1* 36.3 38.6 35.4  PLT 177 213 277 245  MCV 92.4 91.2 90.9 90.0  MCH 31.0 30.7 30.7 31.2  MCHC 33.5 33.6 33.7 34.6  RDW 13.2 13.2 13.3 13.6    Chemistries  Recent Labs  Lab 06/04/17 0330 06/05/17 1654 06/08/17 2127 06/09/17 0328 06/10/17 0430  NA 133* 133* 127* 130* 129*  K 3.8 4.7 4.9 4.1 5.4*  CL 98* 100* 92* 97* 98*  CO2 27 25 27 26 25   GLUCOSE 99 121* 137* 106* 138*  BUN 34* 43* 39* 36* 46*  CREATININE  1.28* 1.52* 2.16* 1.78* 1.70*  CALCIUM 9.0 9.2 9.4 9.0 8.8*  AST  --   --  40  --   --   ALT  --   --  19  --   --   ALKPHOS  --   --  45  --   --   BILITOT  --   --  0.9  --   --    ------------------------------------------------------------------------------------------------------------------ estimated creatinine clearance is 19.1 mL/min (A)  (by C-G formula based on SCr of 1.7 mg/dL (H)). ------------------------------------------------------------------------------------------------------------------ No results for input(s): HGBA1C in the last 72 hours. ------------------------------------------------------------------------------------------------------------------ No results for input(s): CHOL, HDL, LDLCALC, TRIG, CHOLHDL, LDLDIRECT in the last 72 hours. ------------------------------------------------------------------------------------------------------------------ Recent Labs    06/09/17 0328  TSH 4.838*   ------------------------------------------------------------------------------------------------------------------ No results for input(s): VITAMINB12, FOLATE, FERRITIN, TIBC, IRON, RETICCTPCT in the last 72 hours.  Coagulation profile No results for input(s): INR, PROTIME in the last 168 hours.  No results for input(s): DDIMER in the last 72 hours.  Cardiac Enzymes Recent Labs  Lab 06/09/17 0328 06/09/17 0924 06/09/17 1518  TROPONINI 0.08* 0.06* 0.06*   ------------------------------------------------------------------------------------------------------------------ Invalid input(s): POCBNP    Assessment & Plan   83 year old female with a known history of chronic systolic CHF, last EF of 10-27%, had an NSTEMI a week ago now is being admitted for new compression fracture of mid thoracic vertebral body and severe hyponatremia  *New compression fracture mid thoracic vertebral body -no reported fall -Noted on Norco, prednisone, seen by orthopedic, family not decided about kyphoplasty start physical therapy evaluation.  *Severe hyponatremia -Likely from overdiuresis -Sodium improved with gentle hydration  Lasix on hold  *Acute on chronic kidney disease stage III -Likely due to overdiuresis -improved with ivf -Monitor kidney function hold off ACE inhibitor and Lasix  *Chronic systolic CHF -EF of  25-36% -Well compensated at this time -Monitor ON telemetry BP is low so hold Imdur.      Code Status Orders  (From admission, onward)        Start     Ordered   06/09/17 0008  Full code  Continuous     06/09/17 0008    Code Status History    Date Active Date Inactive Code Status Order ID Comments User Context   06/01/2017 23:10 06/04/2017 18:03 DNR [id number]  Gorden Harms, MD Inpatient   02/25/2017 00:00 02/26/2017 02:45 Full Code [id number]  Lance Coon, MD ED   02/18/2017 16:20 02/20/2017 18:54 Full Code [id number]  Hillary Bow, MD ED    Advance Directive Documentation     Most Recent Value  Type of Advance Directive  Living will  Pre-existing out of facility DNR order (yellow form or pink MOST form)  No data  "MOST" Form in Place?  No data           Consults orthopedics  DVT Prophylaxis discontinue heparin for possible procedure  Lab Results  Component Value Date   PLT 245 06/09/2017     Time Spent in minutes 35 minutes greater than 50% of time spent in care coordination and counseling patient regarding the condition and plan of care.   Epifanio Lesches M.D on 06/10/2017 at 12:26 PM  Between 7am to 6pm - Pager - [phone]  After 6pm go to [email or web address] - password [email or web address] [place] [facility]   Office  [phone]

## 2017-06-10 NOTE — Plan of Care (Signed)
  Progressing Activity: Risk for activity intolerance will decrease 06/10/2017 1137 - Progressing by Liliane Channel, RN Pain Managment: General experience of comfort will improve 06/10/2017 1137 - Progressing by Liliane Channel, RN Safety: Ability to remain free from injury will improve 06/10/2017 1137 - Progressing by Liliane Channel, RN Activity: Ability to tolerate increased activity will improve 06/10/2017 1137 - Progressing by Liliane Channel, RN

## 2017-06-10 NOTE — Progress Notes (Addendum)
Pt potassium was at 5.4 this morning. I called doctor Vianne Bulls and ordered to hold on potassium at 1000.

## 2017-06-10 NOTE — Plan of Care (Signed)
  Progressing Education: Knowledge of General Education information will improve 06/10/2017 0013 - Progressing by Loran Senters, RN Activity: Risk for activity intolerance will decrease 06/10/2017 0013 - Progressing by Loran Senters, RN Pain Managment: General experience of comfort will improve 06/10/2017 0013 - Progressing by Loran Senters, RN

## 2017-06-10 NOTE — Progress Notes (Signed)
Pt was having 4 beats of V-tac. Doctor  Vianne Bulls was notified and awaiting orders. Will continue to monitor.

## 2017-06-10 NOTE — Progress Notes (Signed)
OT Cancellation Note  Patient Details Name: Jacqueline [name] MRN: [id number] DOB: Sep 05, 1932   Cancelled Treatment:      OT consult received and chart reviewed.  Pt's potassium noted to be elevated to 5.4 today.  Per OT guidelines for elevated potassium, will hold OT at this time and re-attempt OT evaluation at a later date/time as medically appropriate.   Logun Colavito T Taedyn Glasscock, OTR/L, CLT  Admir Candelas 06/10/2017, 1:37 PM

## 2017-06-10 NOTE — Progress Notes (Signed)
Pt Blood pressure wast at 100/50 and has a scheduled. Doctor Vianne Bulls was called. She ordered to hold on the Imdur. Will continue to monitor.

## 2017-06-11 ENCOUNTER — Telehealth: Payer: Self-pay | Admitting: *Deleted

## 2017-06-11 ENCOUNTER — Encounter: Payer: Self-pay | Admitting: Anesthesiology

## 2017-06-11 LAB — BASIC METABOLIC PANEL
ANION GAP: 5 (ref 5–15)
ANION GAP: 5 (ref 5–15)
BUN: 47 mg/dL — ABNORMAL HIGH (ref 6–20)
BUN: 48 mg/dL — AB (ref 6–20)
CALCIUM: 8.8 mg/dL — AB (ref 8.9–10.3)
CALCIUM: 9.1 mg/dL (ref 8.9–10.3)
CO2: 26 mmol/L (ref 22–32)
CO2: 26 mmol/L (ref 22–32)
Chloride: 98 mmol/L — ABNORMAL LOW (ref 101–111)
Chloride: 100 mmol/L — ABNORMAL LOW (ref 101–111)
Creatinine, Ser: 1.51 mg/dL — ABNORMAL HIGH (ref 0.44–1.00)
Creatinine, Ser: 1.48 mg/dL — ABNORMAL HIGH (ref 0.44–1.00)
GFR calc Af Amer: 37 mL/min — ABNORMAL LOW (ref >60)
GFR, EST AFRICAN AMERICAN: 36 mL/min — AB (ref >60)
GFR, EST NON AFRICAN AMERICAN: 31 mL/min — AB (ref >60)
GFR, EST NON AFRICAN AMERICAN: 32 mL/min — AB (ref >60)
GLUCOSE: 110 mg/dL — AB (ref 65–99)
Glucose, Bld: 146 mg/dL — ABNORMAL HIGH (ref 65–99)
POTASSIUM: 6 mmol/L — AB (ref 3.5–5.1)
Potassium: 5.4 mmol/L — ABNORMAL HIGH (ref 3.5–5.1)
SODIUM: 131 mmol/L — AB (ref 135–145)
Sodium: 129 mmol/L — ABNORMAL LOW (ref 135–145)

## 2017-06-11 LAB — GLUCOSE, CAPILLARY: GLUCOSE-CAPILLARY: 117 mg/dL — AB (ref 65–99)

## 2017-06-11 MED ORDER — CLINDAMYCIN PHOSPHATE 600 MG/50ML IV SOLN
600.0000 mg | Freq: Once | INTRAVENOUS | Status: AC
  Administered 2017-06-12: 600 mg via INTRAVENOUS
  Filled 2017-06-11: qty 50

## 2017-06-11 MED ORDER — SODIUM POLYSTYRENE SULFONATE 15 GM/60ML PO SUSP
30.0000 g | ORAL | Status: AC
  Administered 2017-06-11: 30 g via ORAL
  Filled 2017-06-11: qty 120

## 2017-06-11 NOTE — Telephone Encounter (Signed)
Patient scheduled for a CT 06/23/16, but does not think she can have it done. She has been hospitalized 3 times in past week and had CT scan done there and there is something going on with her kidneys where she should not have more contrast. We have a dedicated iver scan ordered and she had CT Abdominal and pelvis done. Please advise.   CT Abdomen Pelvis W Contrast (Accession [id number]) (Order [id number])  Imaging  Exam Information   PACS Images   Show images for CT Abdomen Pelvis W Contrast  Study Result   CLINICAL DATA:  Bilateral back pain radiating to anterior abdomen with nausea and vomiting as well as diarrhea 4 days.  EXAM: CT ABDOMEN AND PELVIS WITH CONTRAST  TECHNIQUE: Multidetector CT imaging of the abdomen and pelvis was performed using the standard protocol following bolus administration of intravenous contrast.  CONTRAST:  45mL ISOVUE-300 IOPAMIDOL (ISOVUE-300) INJECTION 61%  COMPARISON:  03/04/2017 and 02/24/2017  FINDINGS: Lower chest: 6 mm nodule over the medial right lower lobe unchanged. Scarring with mild nodularity over the posterolateral right lower lobe without significant change. Calcification of the right coronary artery and adjacent the mitral valve annulus. Mild prominence of the heart.  Hepatobiliary: Nodular contour to the liver with stable hypodense nodules over the right lobe previously evaluated by a PET-CT and found to be non hypermetabolic. Gallbladder and biliary tree are normal.  Pancreas: Normal.  Spleen: Subcentimeter hypodensity over the inferior aspect of the spleen unchanged likely a cyst or hemangioma.  Adrenals/Urinary Tract: Adrenal glands are normal. Kidneys are normal in size without hydronephrosis or nephrolithiasis. Ureters are within normal. Bladder is normal.  Stomach/Bowel: Stomach and small bowel are normal. The appendix is normal. Colon is unremarkable.  Vascular/Lymphatic: There is mild calcified plaque  over the abdominal aorta and iliac arteries. No adenopathy.  Reproductive: Within normal.  Other: No free fluid or focal inflammatory change.  Musculoskeletal: Mild degenerate change of the spine and hips.  IMPRESSION: No acute findings in the abdomen/pelvis.  Evidence of known cirrhosis with stable right lobe nodules.  Scar in the right base. 6 mm nodule over the right lower lobe unchanged. Recommend followup CT 6 months. This recommendation follows the consensus statement: Guidelines for Management of Small Pulmonary Nodules Detected on CT Scans: A Statement from the [employer] as published in Radiology 2005; 237:395-400. Online at: https://www.arnold.com/.  Aortic Atherosclerosis (ICD10-I70.0).   Electronically Signed   By: Marin Olp M.D.   On: 06/05/2017 18:21

## 2017-06-11 NOTE — Progress Notes (Signed)
Potassium is high, Kayexalate given, patient potassium supplement stopped yesterday, Lasix not given because of dehydration.  Recheck the potassium again now.  Discussed with pharmacist.

## 2017-06-11 NOTE — Progress Notes (Signed)
PT Cancellation Note  Patient Details Name: Jacqueline [name] MRN: [id number] DOB: [birth year]   Cancelled Treatment:    Reason Eval/Treat Not Completed: Patient not medically ready.  PT consult received.  Chart reviewed.  Pt's potassium has continued to increase to 6.0 today.  Per PT guidelines for elevated potassium, will hold PT at this time and re-attempt PT evaluation at a later date/time as medically appropriate  Leitha Bleak, PT 06/11/17, 8:10 AM [phone]

## 2017-06-11 NOTE — Plan of Care (Signed)
  Progressing Education: Knowledge of General Education information will improve 06/11/2017 1619 - Progressing by Rolley Sims, RN Activity: Risk for activity intolerance will decrease 06/11/2017 1619 - Progressing by Rolley Sims, RN Pain Managment: General experience of comfort will improve 06/11/2017 1619 - Progressing by Rolley Sims, RN

## 2017-06-11 NOTE — Care Management (Signed)
Anticipate patient will have kyphoplasty 12/28

## 2017-06-11 NOTE — Anesthesia Preprocedure Evaluation (Addendum)
Anesthesia Evaluation  Patient identified by MRN, date of birth, ID band Patient awake    Reviewed: Allergy & Precautions, H&P , NPO status , Patient's Chart, lab work & pertinent test results, reviewed documented beta blocker date and time   Airway Mallampati: III  TM Distance: >3 FB Neck ROM: limited  Mouth opening: Limited Mouth Opening  Dental  (+) Missing, Dental Advidsory Given, Poor Dentition   Pulmonary shortness of breath and Long-Term Oxygen Therapy, neg COPD, neg recent URI, former smoker,  On O2          Cardiovascular Exercise Tolerance: Good hypertension, Pt. on medications and Pt. on home beta blockers (-) angina+ CAD, + Past MI and +CHF  (-) Cardiac Stents and (-) CABG (-) dysrhythmias (-) Valvular Problems/Murmurs     Neuro/Psych negative neurological ROS  negative psych ROS   GI/Hepatic Neg liver ROS, GERD  ,Liver lesion   Endo/Other  diabetes, Well ControlledHypothyroidism Currently not on BS meds  Renal/GU Renal disease  negative genitourinary   Musculoskeletal   Abdominal   Peds negative pediatric ROS (+)  Hematology negative hematology ROS (+)   Anesthesia Other Findings Past Medical History: No date: CHF (congestive heart failure) ([place]) 03/13/2017: Diabetes mellitus without complication (HCC)     Comment:  Currently no high BS.  Has come off meds.  But having               episodes of low BS now. No date: Dilated idiopathic cardiomyopathy (HCC) No date: GERD (gastroesophageal reflux disease) No date: Hyperlipidemia No date: Hypertension No date: Hypothyroidism No date: Renal disorder  EF 30-35 %  Reproductive/Obstetrics negative OB ROS                          Anesthesia Physical Anesthesia Plan  ASA: IV  Anesthesia Plan: General   Post-op Pain Management:    Induction: Intravenous  PONV Risk Score and Plan: 3 and Propofol infusion  Airway Management  Planned: Simple Face Mask  Additional Equipment:   Intra-op Plan:   Post-operative Plan:   Informed Consent: I have reviewed the patients History and Physical, chart, labs and discussed the procedure including the risks, benefits and alternatives for the proposed anesthesia with the patient or authorized representative who has indicated his/her understanding and acceptance.   Dental Advisory Given  Plan Discussed with: Anesthesiologist, CRNA and Surgeon  Anesthesia Plan Comments:        Anesthesia Quick Evaluation

## 2017-06-11 NOTE — Progress Notes (Signed)
AM potassium 6.0. MD Marcille Blanco notified. No new orders at this time. Will continue to monitor.

## 2017-06-11 NOTE — Progress Notes (Signed)
Patient seen and CAT scan reviewed with patient and her daughter. On exam she is quite tender to percussion at T6 and there is a palpable deformity. Discussed treatment options and they agreed to kyphoplasty tomorrow with hopes of resolving her pain and getting her mobilized.

## 2017-06-11 NOTE — Progress Notes (Signed)
OT Cancellation Note  Patient Details Name: Jacqueline [name] MRN: [id number] DOB: 10/04/1932   Cancelled Treatment:    Reason Eval/Treat Not Completed: Medical issues which prohibited therapy. Chart reviewed. Pt continues to have elevated potassium, now 6.0. Contraindicated for therapy at this time per therapy protocols. Will re-attempt OT evaluation at a later date/time as medically appropriate.  Jeni Salles, MPH, MS, OTR/L ascom [phone] 06/11/17, 8:09 AM

## 2017-06-11 NOTE — Plan of Care (Signed)
  Progressing Safety: Ability to remain free from injury will improve 06/11/2017 0119 - Progressing by Loran Senters, RN

## 2017-06-12 ENCOUNTER — Encounter
Admission: EM | Disposition: A | Discharge status: Skilled Nursing Facility | Payer: Self-pay | Source: Home / Self Care | Attending: Internal Medicine

## 2017-06-12 ENCOUNTER — Inpatient Hospital Stay: Payer: Medicare Other

## 2017-06-12 ENCOUNTER — Inpatient Hospital Stay: Payer: Medicare Other | Admitting: Certified Registered Nurse Anesthetist

## 2017-06-12 ENCOUNTER — Encounter: Payer: Self-pay | Admitting: Anesthesiology

## 2017-06-12 HISTORY — PX: KYPHOPLASTY: SHX5884

## 2017-06-12 LAB — CULTURE, BLOOD (ROUTINE X 2)
Culture: NO GROWTH
Special Requests: ADEQUATE

## 2017-06-12 LAB — CBC
HEMATOCRIT: 38.1 % (ref 35.0–47.0)
HEMOGLOBIN: 12.8 g/dL (ref 12.0–16.0)
MCH: 30.9 pg (ref 26.0–34.0)
MCHC: 33.5 g/dL (ref 32.0–36.0)
MCV: 92.2 fL (ref 80.0–100.0)
Platelets: 292 10*3/uL (ref 150–440)
RBC: 4.13 MIL/uL (ref 3.80–5.20)
RDW: 13.7 % (ref 11.5–14.5)
WBC: 7 10*3/uL (ref 3.6–11.0)

## 2017-06-12 LAB — BASIC METABOLIC PANEL
Anion gap: 7 (ref 5–15)
BUN: 45 mg/dL — AB (ref 6–20)
CHLORIDE: 102 mmol/L (ref 101–111)
CO2: 25 mmol/L (ref 22–32)
CREATININE: 1.29 mg/dL — AB (ref 0.44–1.00)
Calcium: 9.4 mg/dL (ref 8.9–10.3)
GFR, EST AFRICAN AMERICAN: 43 mL/min — AB (ref >60)
GFR, EST NON AFRICAN AMERICAN: 37 mL/min — AB (ref >60)
Glucose, Bld: 121 mg/dL — ABNORMAL HIGH (ref 65–99)
Potassium: 4.8 mmol/L (ref 3.5–5.1)
SODIUM: 134 mmol/L — AB (ref 135–145)

## 2017-06-12 LAB — GLUCOSE, CAPILLARY: Glucose-Capillary: 117 mg/dL — ABNORMAL HIGH (ref 65–99)

## 2017-06-12 SURGERY — KYPHOPLASTY
Anesthesia: General | Site: Back | Wound class: Clean

## 2017-06-12 MED ORDER — FENTANYL CITRATE (PF) 100 MCG/2ML IJ SOLN
INTRAMUSCULAR | Status: DC | PRN
  Administered 2017-06-12: 25 ug via INTRAVENOUS

## 2017-06-12 MED ORDER — FENTANYL CITRATE (PF) 100 MCG/2ML IJ SOLN
INTRAMUSCULAR | Status: AC
  Filled 2017-06-12: qty 2

## 2017-06-12 MED ORDER — LIDOCAINE HCL 1 % IJ SOLN
INTRAMUSCULAR | Status: DC | PRN
  Administered 2017-06-12: 15 mL
  Administered 2017-06-12: 10 mL

## 2017-06-12 MED ORDER — PROPOFOL 10 MG/ML IV BOLUS
INTRAVENOUS | Status: AC
  Filled 2017-06-12: qty 20

## 2017-06-12 MED ORDER — BUPIVACAINE-EPINEPHRINE (PF) 0.5% -1:200000 IJ SOLN
INTRAMUSCULAR | Status: DC | PRN
  Administered 2017-06-12: 15 mL via PERINEURAL

## 2017-06-12 MED ORDER — LACTATED RINGERS IV SOLN
INTRAVENOUS | Status: DC | PRN
  Administered 2017-06-12: 08:00:00 via INTRAVENOUS

## 2017-06-12 MED ORDER — PROPOFOL 500 MG/50ML IV EMUL
INTRAVENOUS | Status: DC | PRN
  Administered 2017-06-12 (×3): 10 ug via INTRAVENOUS

## 2017-06-12 MED ORDER — FENTANYL CITRATE (PF) 100 MCG/2ML IJ SOLN: 25.0000 ug | INTRAMUSCULAR | Status: DC | PRN

## 2017-06-12 MED ORDER — KETAMINE HCL 50 MG/ML IJ SOLN
INTRAMUSCULAR | Status: AC
  Filled 2017-06-12: qty 10

## 2017-06-12 MED ORDER — ENOXAPARIN SODIUM 30 MG/0.3ML ~~LOC~~ SOLN
30.0000 mg | SUBCUTANEOUS | Status: DC
  Administered 2017-06-13 – 2017-06-15 (×3): 30 mg via SUBCUTANEOUS
  Filled 2017-06-12 (×3): qty 0.3

## 2017-06-12 SURGICAL SUPPLY — 17 items
CEMENT KYPHON CX01A KIT/MIXER (Cement) ×3 IMPLANT
DERMABOND ADVANCED (GAUZE/BANDAGES/DRESSINGS) ×2
DERMABOND ADVANCED .7 DNX12 (GAUZE/BANDAGES/DRESSINGS) ×1 IMPLANT
DEVICE BIOPSY BONE KYPHX (INSTRUMENTS) ×3 IMPLANT
DRAPE C-ARM XRAY 36X54 (DRAPES) ×3 IMPLANT
DURAPREP 26ML APPLICATOR (WOUND CARE) ×3 IMPLANT
GLOVE SURG SYN 9.0  PF PI (GLOVE) ×2
GLOVE SURG SYN 9.0 PF PI (GLOVE) ×1 IMPLANT
GOWN SRG 2XL LVL 4 RGLN SLV (GOWNS) ×1 IMPLANT
GOWN STRL NON-REIN 2XL LVL4 (GOWNS) ×2
GOWN STRL REUS W/ TWL LRG LVL3 (GOWN DISPOSABLE) ×1 IMPLANT
GOWN STRL REUS W/TWL LRG LVL3 (GOWN DISPOSABLE) ×2
PACK KYPHOPLASTY (MISCELLANEOUS) ×3 IMPLANT
STRAP SAFETY BODY (MISCELLANEOUS) ×3 IMPLANT
TRAY KYPHOPAK 15/2 EXPRESS (KITS) ×3 IMPLANT
TRAY KYPHOPAK 15/3 EXPRESS 1ST (MISCELLANEOUS) IMPLANT
TRAY KYPHOPAK 20/3 EXPRESS 1ST (MISCELLANEOUS) IMPLANT

## 2017-06-12 NOTE — Care Management (Signed)
Notified [facility] of recommendation for skilled nursing facility placement

## 2017-06-12 NOTE — Telephone Encounter (Signed)
Spoke with Daughter-Jacqueline [name]. Confirmed that ct scan next week can be scheduled per Ander Purpura, NP. Patient currently in [place] per daughter-recovering from kypoplasty. Daughter may need to post pone future apt in Jan with Dr. Rogue Bussing. Pt most likely will go to rehab. Daughter will contact our office back if this apt needs to be post poned.

## 2017-06-12 NOTE — Progress Notes (Signed)
PHARMACIST - PHYSICIAN COMMUNICATION  CONCERNING:  Enoxaparin (Lovenox) for DVT Prophylaxis    RECOMMENDATION: Patient was prescribed enoxaprin 40mg  q24 hours for VTE prophylaxis.   Filed Weights   06/10/17 0332 06/11/17 0406 06/12/17 0522  Weight: 125 lb 9.6 oz (57 kg) 127 lb 14.4 oz (58 kg) 125 lb 14.4 oz (57.1 kg)    Body mass index is 26.31 kg/m.  Estimated Creatinine Clearance: 25.2 mL/min (A) (by C-G formula based on SCr of 1.29 mg/dL (H)).  Patient is candidate for enoxaparin 30mg  every 24 hours based on CrCl <47ml/min.  DESCRIPTION: Pharmacy has adjusted enoxaparin dose.  Patient is now receiving enoxaparin 30mg  every 24 hours.  Nancy Fetter, PharmD, BCPS Clinical Pharmacist  06/12/2017 10:39 AM

## 2017-06-12 NOTE — Clinical Social Work Note (Signed)
Clinical Social Work Assessment  Patient Details  Name: Jacqueline [name] MRN: [id number] Date of Birth: 1932-08-11  Date of referral:  06/12/17               Reason for consult:  Facility Placement                Permission sought to share information with:  Family Supports, Customer service manager Permission granted to share information::  Yes, Verbal Permission Granted  Name::     [name] Daughter [phone] or [name], [name] [phone] or [name]   [phone]   Agency::  SNF admissions  Relationship::     Contact Information:     Housing/Transportation Living arrangements for the past 2 months:  Single Family Home Source of Information:  Patient, Adult Children Patient Interpreter Needed:  None Criminal Activity/Legal Involvement Pertinent to Current Situation/Hospitalization:  No - Comment as needed Significant Relationships:  Adult Children Lives with:  Self Do you feel safe going back to the place where you live?  No Need for family participation in patient care:  Yes (Comment)  Care giving concerns:  Patient and family feels she needs some short term rehab before she is able to return back home.   Social Worker assessment / plan:  Patient is an 83 year old female who is alert and oriented x4.  Patient lives alone, but has a caregiver who helps her out with some ADLs daily.  Patient has not been to rehab before, CSW explained process and what to expect at SNF for short term rehab.  Patient plans to go to SNF for short term rehab, and then return back home.  Patient and family were explained how insurance will pay for stay.  Patient's family would prefer [facility] or [place] [facility], they do not want [facility] or [facility].  CSW explained to patient's family that if they do not want either of those facilities it will limit their choices some more.  Patient and family expressed understanding, and still would  not like CSW to fax to [facility] or [facility].  Patient's family did not have any other questions or concerns.  Employment status:  Retired Forensic scientist:  Medicare PT Recommendations:  [place] / Referral to community resources:  [place]  Patient/Family's Response to care:  Patient and family agreeable to going to SNF for short term rehab.  Patient/Family's Understanding of and Emotional Response to Diagnosis, Current Treatment, and Prognosis:  Patient and family are hopeful that patient will not have to be at [facility] for very long.  Emotional Assessment Appearance:  Appears stated age Attitude/Demeanor/Rapport:    Affect (typically observed):  Appropriate, Calm, Stable Orientation:  Oriented to Self, Oriented to Place, Oriented to  Time, Oriented to Situation Alcohol / Substance use:  Not Applicable Psych involvement (Current and /or in the community):  No (Comment)  Discharge Needs  Concerns to be addressed:  Lack of Support Readmission within the last 30 days:  No Current discharge risk:  Lack of support system, Lives alone Barriers to Discharge:  Continued Medical Work up   Anell Barr 06/12/2017, 6:13 PM

## 2017-06-12 NOTE — Clinical Social Work Placement (Signed)
   CLINICAL SOCIAL WORK PLACEMENT  NOTE  Date:  06/12/2017  Patient Details  Name: Jacqueline [name] MRN: [id number] Date of Birth: 1932-11-27  Clinical Social Work is seeking post-discharge placement for this patient at the [place] level of care (*CSW will initial, date and re-position this form in  chart as items are completed):  Yes   Patient/family provided with [place] Work Department's list of facilities offering this level of care within the geographic area requested by the patient (or if unable, by the patient's family).  Yes   Patient/family informed of their freedom to choose among providers that offer the needed level of care, that participate in Medicare, Medicaid or managed care program needed by the patient, have an available bed and are willing to accept the patient.  Yes   Patient/family informed of 's ownership interest in [facility] and [facility], as well as of the fact that they are under no obligation to receive care at these facilities.  PASRR submitted to EDS on 06/12/17     PASRR number received on 06/12/17     Existing PASRR number confirmed on       FL2 transmitted to all facilities in geographic area requested by pt/family on 06/12/17     FL2 transmitted to all facilities within larger geographic area on       Patient informed that his/her managed care company has contracts with or will negotiate with certain facilities, including the following:            Patient/family informed of bed offers received.  Patient chooses bed at       Physician recommends and patient chooses bed at      Patient to be transferred to   on  .  Patient to be transferred to facility by       Patient family notified on   of transfer.  Name of family member notified:        PHYSICIAN Please sign FL2     Additional Comment:    _______________________________________________ Ross Ludwig, LCSWA 06/12/2017,  6:18 PM

## 2017-06-12 NOTE — Telephone Encounter (Signed)
Colette, please cnl the patient's ct scan for next week. I will call the patient and let her know.

## 2017-06-12 NOTE — Evaluation (Signed)
Physical Therapy Evaluation Patient Details Name: Jacqueline [name] MRN: [id number] DOB: Nov 10, 1932 Today's Date: 06/12/2017   History of Present Illness  Pt is a 83 y/o F who presented with upper back pain.  Pt had an NSTEMI ~1 wk PTA.  Pt is now on 2L O2 at all times.  She is being treated for UTI.  Chest x-ray showed new compression fx T6.  Pt now s/p T6 kyphoplasty.  Pt's PMH includes CHF.    Clinical Impression  Patient is s/p above surgery resulting in functional limitations due to the deficits listed below (see PT Problem List). Jacqueline [name] was requiring assist with ADLs and limited to ambulating short distances in her home PTA.  Pt with 3 falls over the past 6 months.  Pt currently requires up to min assist with ambulation and close min guard with transfers.  Pt with decreased safety awareness, especially with sit<>stand transfers. Patient will benefit from skilled PT to increase their independence and safety with mobility to allow discharge to the venue listed below.      Follow Up Recommendations SNF    Equipment Recommendations  None recommended by PT    Recommendations for Other Services       Precautions / Restrictions Precautions Precautions: Fall Precaution Comments: Instructed pt in log roll technique and to avoid bending, twisting, arching to prevent discomfort.  Restrictions Weight Bearing Restrictions: No      Mobility  Bed Mobility Overal bed mobility: Needs Assistance Bed Mobility: Rolling;Sidelying to Sit Rolling: Min guard Sidelying to sit: Min guard;HOB elevated       General bed mobility comments: Cues provided for log roll technique.  Pt uses bed rail.   Transfers Overall transfer level: Needs assistance Equipment used: Rolling walker (2 wheeled) Transfers: Sit to/from Stand Sit to Stand: Min guard;Min assist         General transfer comment: Max verbal and tactile cues for proper hand placement and technique as pt initially leaving Bil hands on  RW with standing and sitting.  Pt is slow with both standing and sitting.   Ambulation/Gait Ambulation/Gait assistance: Min guard Ambulation Distance (Feet): 40 Feet Assistive device: Rolling walker (2 wheeled) Gait Pattern/deviations: Step-through pattern;Decreased stride length;Shuffle;Trunk flexed Gait velocity: decreased Gait velocity interpretation: Below normal speed for age/gender General Gait Details: Pt requires cues for proper mangaement and placement of RW as pt has tendency to push RW too far ahead.  Cues for upright posture and forward gaze.  Pt fatigues after ambulating 20 ft and refuses to ambulate farther despite encouragement provided by therapist and family.  SpO2 remains at or above 90% on 2L O2 while ambulating.    Stairs            Wheelchair Mobility    Modified Rankin (Stroke Patients Only)       Balance Overall balance assessment: Needs assistance Sitting-balance support: No upper extremity supported;Feet supported Sitting balance-Leahy Scale: Good     Standing balance support: Bilateral upper extremity supported;During functional activity Standing balance-Leahy Scale: Poor Standing balance comment: Relies on UE support for static and dynamic activities                             Pertinent Vitals/Pain Pain Assessment: No/denies pain    Home Living Family/patient expects to be discharged to:: Private residence Living Arrangements: Alone Available Help at Discharge: Family;Available PRN/intermittently(daughter works during the day) Type of Home: Other(Comment)(condo) Home Access: Ramped entrance  Home Layout: One level Home Equipment: Toilet riser;Walker - 4 wheels;Shower seat;Wheelchair - manual      Prior Function Level of Independence: Needs assistance   Gait / Transfers Assistance Needed: Pt was ambulating short household distances with rollator.  3 falls in the past 6 months.    ADL's / Homemaking Assistance Needed:  Aide comes 2 days/wk for ~4 hrs who assists with laundry, cleaning, and assists pt with bathing.  Pt independent with dressing.          Hand Dominance        Extremity/Trunk Assessment   Upper Extremity Assessment Upper Extremity Assessment: (BUE strength grossly 4/5)    Lower Extremity Assessment Lower Extremity Assessment: (BLE strength grossly 4-/5)    Cervical / Trunk Assessment Cervical / Trunk Assessment: Kyphotic;Other exceptions Cervical / Trunk Exceptions: s/p T6 kyphoplasty  Communication   Communication: HOH  Cognition Arousal/Alertness: Awake/alert Behavior During Therapy: WFL for tasks assessed/performed Overall Cognitive Status: Within Functional Limits for tasks assessed                                 General Comments: mild delay in processing and response time requiring repeated cues      General Comments General comments (skin integrity, edema, etc.): Daughter and son in law present during Evaluation     Exercises     Assessment/Plan    PT Assessment Patient needs continued PT services  PT Problem List Decreased strength;Decreased activity tolerance;Decreased balance;Decreased mobility;Decreased knowledge of use of DME;Decreased safety awareness;Decreased cognition;Decreased knowledge of precautions;Pain;Cardiopulmonary status limiting activity       PT Treatment Interventions DME instruction;Gait training;Stair training;Functional mobility training;Therapeutic activities;Therapeutic exercise;Neuromuscular re-education;Balance training;Patient/family education;Modalities    PT Goals (Current goals can be found in the Care Plan section)  Acute Rehab PT Goals Patient Stated Goal: none stated PT Goal Formulation: With patient/family Time For Goal Achievement: 06/26/17 Potential to Achieve Goals: Fair    Frequency 7X/week   Barriers to discharge Decreased caregiver support Pt lives alone     Co-evaluation                AM-PAC PT "6 Clicks" Daily Activity  Outcome Measure Difficulty turning over in bed (including adjusting bedclothes, sheets and blankets)?: A Little Difficulty moving from lying on back to sitting on the side of the bed? : A Little Difficulty sitting down on and standing up from a chair with arms (e.g., wheelchair, bedside commode, etc,.)?: A Little Help needed moving to and from a bed to chair (including a wheelchair)?: A Little Help needed walking in hospital room?: A Little Help needed climbing 3-5 steps with a railing? : A Lot 6 Click Score: 17    End of Session Equipment Utilized During Treatment: Gait belt;Oxygen Activity Tolerance: Patient limited by fatigue;Patient tolerated treatment well Patient left: in chair;with call bell/phone within reach;with chair alarm set;with family/visitor present Nurse Communication: Mobility status PT Visit Diagnosis: Muscle weakness (generalized) (M62.81);Unsteadiness on feet (R26.81);History of falling (Z91.81);Other abnormalities of gait and mobility (R26.89)    Time: 6144-3154 PT Time Calculation (min) (ACUTE ONLY): 29 min   Charges:   PT Evaluation $PT Eval Low Complexity: 1 Low PT Treatments $Gait Training: 8-22 mins   PT G Codes:   PT G-Codes **NOT FOR INPATIENT CLASS** Functional Assessment Tool Used: AM-PAC 6 Clicks Basic Mobility;Clinical judgement Functional Limitation: Mobility: Walking and moving around Mobility: Walking and Moving Around Current Status (M0867): At  least 40 percent but less than 60 percent impaired, limited or restricted Mobility: Walking and Moving Around Goal Status [phone]): At least 1 percent but less than 20 percent impaired, limited or restricted    Collie Siad PT, DPT 06/12/2017, 12:59 PM

## 2017-06-12 NOTE — Telephone Encounter (Signed)
Lauren, could this be cnl given pt just had a ct scan?

## 2017-06-12 NOTE — Progress Notes (Signed)
OT Cancellation Note  Patient Details Name: JISEL FLEET MRN: [id number] DOB: 09/21/1932   Cancelled Treatment:    Reason Eval/Treat Not Completed: Patient not medically ready(Pt. had Kyphoplasty surgery today. Pt. will need new orders to resume OT services.  )  Harrel Carina, MS, OTR/L 06/12/2017, 12:16 PM

## 2017-06-12 NOTE — OR Nursing (Signed)
Patient has good strength right and left foot good ROM

## 2017-06-12 NOTE — Anesthesia Procedure Notes (Signed)
Procedure Name: MAC Date/Time: 06/12/2017 8:00 AM Performed by: Allean Found, CRNA Pre-anesthesia Checklist: Emergency Drugs available, Suction available, Patient identified, Patient being monitored and Timeout performed Patient Re-evaluated:Patient Re-evaluated prior to induction Oxygen Delivery Method: Nasal cannula Placement Confirmation: positive ETCO2

## 2017-06-12 NOTE — Anesthesia Post-op Follow-up Note (Signed)
Anesthesia QCDR form completed.        

## 2017-06-12 NOTE — Care Management Important Message (Signed)
Important Message  Patient Details  Name: Jacqueline [name] MRN: [id number] Date of Birth: Aug 22, 1932   Medicare Important Message Given:  Yes Signed IM notice given   Katrina Stack, RN 06/12/2017, 11:32 AM

## 2017-06-12 NOTE — Op Note (Signed)
06/12/2017  8:37 AM  PATIENT:  Jacqueline [name]  83 y.o. female  PRE-OPERATIVE DIAGNOSIS:  T6 compression fracture  POST-OPERATIVE DIAGNOSIS:  T6 compression fracture  PROCEDURE:  Procedure(s): KYPHOPLASTY-T6 (N/A)  SURGEON: Laurene Footman, MD  ASSISTANTS: None  ANESTHESIA:   local and MAC  EBL:  Total I/O In: 100 [I.V.:100] Out: -   BLOOD ADMINISTERED:none  DRAINS: none   LOCAL MEDICATIONS USED:  MARCAINE    and XYLOCAINE   SPECIMEN:  Source of Specimen:  T6 vertebral body  DISPOSITION OF SPECIMEN:  PATHOLOGY  COUNTS:  YES  TOURNIQUET:  * No tourniquets in log *  IMPLANTS: Bone cement  DICTATION: .Dragon Dictation  patient was brought the operating room and after adequate anesthesia was obtained patient was placed in this prone position with C-arm brought in and good visualization of T6 was obtained in both AP and lateral projections. After prepping the skin with alcohol and appropriate patient identification and timeout procedure completed 10 cc 1% Xylocaine was a low traded on the right side near the planned incision. The back was then prepped and draped in sterile fashion and repeat timeout procedure carried out. A spinal was then used to get a 50-50 mix of half percent Sensorcaine with epinephrine and 1% Xylocaine total of 30 cc along the path from the pedicle to the skin. After allowing this to set a small incision was made. A paraPedicular approach was used and the trocar entered the vertebral body lateral to the medial wall the pedicle on the AP view. Biopsy was obtained. Drilling was then carried out and inflation of a balloon to 2 cc was done with minimal correction of the superior endplate deformity. After the cement was mixed and the appropriate consistency 2.5 cc of bone cement was placed into the vertebral body with good interdigitation and fill in the central aspect from superior to inferior. When the cement was set the trochars removed and it Dermabond followed  by a Band-Aid were used for skin closure     PLAN OF CARE: Continue inpatient  PATIENT DISPOSITION:  PACU - hemodynamically stable.

## 2017-06-12 NOTE — Anesthesia Postprocedure Evaluation (Signed)
Anesthesia Post Note  [name]  Procedure(s) Performed: Jordan Likes (N/A Back)  Patient location during evaluation: PACU Anesthesia Type: General Level of consciousness: awake and alert Pain management: pain level controlled Vital Signs Assessment: post-procedure vital signs reviewed and stable Respiratory status: spontaneous breathing, nonlabored ventilation, respiratory function stable and patient connected to nasal cannula oxygen Cardiovascular status: blood pressure returned to baseline and stable Postop Assessment: no apparent nausea or vomiting Anesthetic complications: no     Last Vitals:  Vitals:   06/12/17 0936 06/12/17 0953  BP:  (!) 154/53  Pulse:  73  Resp:  14  Temp: 36.9 C 36.8 C  SpO2:  100%    Last Pain:  Vitals:   06/12/17 0953  TempSrc: Oral  PainSc: 0-No pain                 Martha Clan

## 2017-06-12 NOTE — Transfer of Care (Signed)
Immediate Anesthesia Transfer of Care Note  [name]  Procedure(s) Performed: Jordan Likes (N/A Back)  Patient Location: PACU  Anesthesia Type:MAC  Level of Consciousness: drowsy  Airway & Oxygen Therapy: Patient Spontanous Breathing and Patient connected to nasal cannula oxygen  Post-op Assessment: Report given to RN and Post -op Vital signs reviewed and stable  Post vital signs: Reviewed and stable  Last Vitals:  Vitals:   06/11/17 2037 06/12/17 0522  BP: (!) 164/58 (!) 163/65  Pulse: 68 76  Resp: 17 17  Temp: 36.8 C 36.6 C  SpO2: 100% 99%    Last Pain:  Vitals:   06/12/17 0522  TempSrc: Oral  PainSc:       Patients Stated Pain Goal: 0 (28/76/81 1572)  Complications: No apparent anesthesia complications

## 2017-06-12 NOTE — Care Management (Signed)
Kyphoplasty performed today and physical therapy consult is pending.  Updated [facility]. Updated patient's daughter that if able to be discharged home, home health services would be resumed

## 2017-06-12 NOTE — NC FL2 (Signed)
[place] LEVEL OF CARE SCREENING TOOL     IDENTIFICATION  Patient Name: Jacqueline [name] Birthdate: [birth year] Sex: female Admission Date (Current Location): 06/08/2017  [place] and [place] Number:  Engineering geologist and Address:  [facility], [address], [place], [place] [address]      Provider Number: [id number]  Attending Physician Name and Address:  Epifanio Lesches, MD  Relative Name and Phone Number:  [name] Daughter [phone] or Tangela, Dolliver [phone] or [name]   [phone]     Current Level of Care: Hospital Recommended Level of Care: [place] Prior Approval Number:    Date Approved/Denied:   PASRR Number: [id number]  Discharge Plan: SNF    Current Diagnoses: Patient Active Problem List   Diagnosis Date Noted  . Hyponatremia 06/09/2017  . MI, acute, non ST segment elevation ([place]) 06/01/2017  . Acute upper back pain 03/05/2017  . Carcinoma of unknown primary ([place]) 03/05/2017  . Liver lesion 02/27/2017  . Diabetes ([place]) 02/24/2017  . Chronic systolic CHF (congestive heart failure) ([place]) 02/24/2017  . HTN (hypertension) 02/24/2017  . GERD (gastroesophageal reflux disease) 02/24/2017  . Hypothyroidism 02/24/2017  . Hypoglycemia 02/24/2017  . Dizziness 02/18/2017    Orientation RESPIRATION BLADDER Height & Weight     Self, Situation, Place, Time  O2(2L) Continent Weight: 125 lb 14.4 oz (57.1 kg) Height:  4\' 10"  (147.3 cm)  BEHAVIORAL SYMPTOMS/MOOD NEUROLOGICAL BOWEL NUTRITION STATUS      Continent Diet(Carb Modified)  AMBULATORY STATUS COMMUNICATION OF NEEDS Skin   Limited Assist Verbally Surgical wounds                       Personal Care Assistance Level of Assistance  Feeding, Dressing, Bathing Bathing Assistance: Limited assistance Feeding assistance: Independent Dressing Assistance: Limited assistance     Functional  Limitations Info  Sight, Speech, Hearing Sight Info: Adequate Hearing Info: Adequate Speech Info: Adequate    SPECIAL CARE FACTORS FREQUENCY  PT (By licensed PT)     PT Frequency: 5x a week              Contractures Contractures Info: Not present    Additional Factors Info  Code Status, Allergies, Psychotropic Code Status Info: Full Code Allergies Info: CIPROFLOXACIN, DIGOXIN AND RELATED, ERYTHROMYCIN, LOPID GEMFIBROZIL, NITROFURANTOIN, CEFUROXIME, PENICILLINS  Psychotropic Info: sertraline (ZOLOFT) tablet 50 mg          Current Medications (06/12/2017):  This is the current hospital active medication list Current Facility-Administered Medications  Medication Dose Route Frequency Provider Last Rate Last Dose  . 0.9 %  sodium chloride infusion   Intravenous Continuous Max Sane, MD   Stopped at 06/11/17 1204  . acetaminophen (TYLENOL) tablet 650 mg  650 mg Oral Q6H PRN Max Sane, MD       Or  . acetaminophen (TYLENOL) suppository 650 mg  650 mg Rectal Q6H PRN Max Sane, MD      . atorvastatin (LIPITOR) tablet 80 mg  80 mg Oral q1800 Max Sane, MD   80 mg at 06/12/17 1700  . bisacodyl (DULCOLAX) EC tablet 5 mg  5 mg Oral Daily PRN Max Sane, MD      . carvedilol (COREG) tablet 12.5 mg  12.5 mg Oral BID WC Manuella Ghazi, Vip[place], MD   12.5 mg at 06/12/17 1700  . docusate sodium (COLACE) capsule 100 mg  100 mg Oral BID Max Sane, MD   100 mg  at 06/12/17 1026  . [START ON 06/13/2017] enoxaparin (LOVENOX) injection 30 mg  30 mg Subcutaneous Q24H Hessie Knows, MD      . ergocalciferol (VITAMIN D2) capsule 50,000 Units  50,000 Units Oral Weekly Max Sane, MD   50,000 Units at 06/09/17 1104  . HYDROcodone-acetaminophen (NORCO/VICODIN) 5-325 MG per tablet 1-2 tablet  1-2 tablet Oral Q4H PRN Max Sane, MD   1 tablet at 06/11/17 0859  . isosorbide mononitrate (IMDUR) 24 hr tablet 30 mg  30 mg Oral Daily Max Sane, MD   30 mg at 06/12/17 1025  . ketorolac (TORADOL) 30 MG/ML  injection 15 mg  15 mg Intravenous Q6H PRN Max Sane, MD   15 mg at 06/10/17 2100  . levothyroxine (SYNTHROID, LEVOTHROID) tablet 25 mcg  25 mcg Oral QAC breakfast Max Sane, MD   25 mcg at 06/12/17 0617  . meclizine (ANTIVERT) tablet 12.5 mg  12.5 mg Oral BID Max Sane, MD   12.5 mg at 06/12/17 1026  . MEDLINE mouth rinse  15 mL Mouth Rinse BID Max Sane, MD   15 mL at 06/11/17 2127  . methylPREDNISolone (MEDROL DOSEPAK) tablet 4 mg  4 mg Oral 4X daily taper Dustin Flock, MD   4 mg at 06/12/17 1423  . multivitamin with minerals tablet 1 tablet  1 tablet Oral Daily Max Sane, MD   1 tablet at 06/12/17 1025  . ondansetron (ZOFRAN) tablet 4 mg  4 mg Oral Q6H PRN Max Sane, MD       Or  . ondansetron (ZOFRAN) injection 4 mg  4 mg Intravenous Q6H PRN Max Sane, MD      . pantoprazole (PROTONIX) EC tablet 40 mg  40 mg Oral Daily Max Sane, MD   40 mg at 06/12/17 1025  . sertraline (ZOLOFT) tablet 50 mg  50 mg Oral Daily Max Sane, MD   50 mg at 06/12/17 1025  . traZODone (DESYREL) tablet 25 mg  25 mg Oral QHS PRN Max Sane, MD         Discharge Medications: Please see discharge summary for a list of discharge medications.  Relevant Imaging Results:  Relevant Lab Results:   Additional Information SSN [id number]  Ross Ludwig, [place]

## 2017-06-12 NOTE — Progress Notes (Signed)
Pt back from kyphoplasty, vss, no complaints. Pt resting in room comfortably. Will continue to monitor.

## 2017-06-12 NOTE — Progress Notes (Signed)
[place] at [facility]                                                                                                                                                                                  Patient Demographics   Jacqueline [name], is a 83 y.o. female, DOB - December 31, 1932, UYQ:[id number]  Admit date - 06/08/2017   Admitting Physician Max Sane, MD  Outpatient Primary MD for the patient is Tracie Harrier, MD   LOS - 3  Subjective: Patient seen at bedside, post kyphoplasty and patient says that her pain improved.Marland Kitchen  Physical therapy recommended rehab.  Review of Systems:   CONSTITUTIONAL: No documented fever. No fatigue, weakness. No weight gain, no weight loss.  EYES: No blurry or double vision.  ENT: No tinnitus. No postnasal drip. No redness of the oropharynx.  RESPIRATORY: No cough, no wheeze, no hemoptysis. No dyspnea.  CARDIOVASCULAR: No chest pain. No orthopnea. No palpitations. No syncope.  GASTROINTESTINAL: No nausea, no vomiting or diarrhea. No abdominal pain. No melena or hematochezia.  GENITOURINARY: No dysuria or hematuria.  ENDOCRINE: No polyuria or nocturia. No heat or cold intolerance.  HEMATOLOGY: No anemia. No bruising. No bleeding.  INTEGUMENTARY: No rashes. No lesions.  MUSCULOSKELETAL: Positive back pain NEUROLOGIC: No numbness, tingling, or ataxia. No seizure-type activity.  PSYCHIATRIC: No anxiety. No insomnia. No ADD.    Vitals:   Vitals:   06/12/17 0915 06/12/17 0930 06/12/17 0936 06/12/17 0953  BP: (!) 141/57 (!) 126/50  (!) 154/53  Pulse: 70 70  73  Resp: 16 17  14   Temp:   98.4 F (36.9 C) 98.2 F (36.8 C)  TempSrc:    Oral  SpO2: 99% 98%  100%  Weight:      Height:        Wt Readings from Last 3 Encounters:  06/12/17 57.1 kg (125 lb 14.4 oz)  06/05/17 58.1 kg (128 lb)  06/04/17 58.1 kg (128 lb)     Intake/Output Summary (Last 24 hours) at 06/12/2017 1515 Last data filed at 06/12/2017 1355 Gross per 24  hour  Intake 680 ml  Output 802 ml  Net -122 ml    Physical Exam:   GENERAL: Pleasant-appearing in no apparent distress.  HEAD, EYES, EARS, NOSE AND THROAT: Atraumatic, normocephalic. Extraocular muscles are intact. Pupils equal and reactive to light. Sclerae anicteric. No conjunctival injection. No oro-pharyngeal erythema.  NECK: Supple. There is no jugular venous distention. No bruits, no lymphadenopathy, no thyromegaly.  HEART: Regular rate and rhythm,. No murmurs, no rubs, no clicks.  LUNGS: Clear to auscultation bilaterally. No rales or rhonchi. No wheezes.  ABDOMEN: Soft, flat, nontender, nondistended. Has good  bowel sounds. No hepatosplenomegaly appreciated.  EXTREMITIES: No evidence of any cyanosis, clubbing, or peripheral edema.  +2 pedal and radial pulses bilaterally.  NEUROLOGIC: The patient is alert, awake, and oriented x3 with no focal motor or sensory deficits appreciated bilaterally.  SKIN: Moist and warm with no rashes appreciated.  Psych: Not anxious, depressed LN: No inguinal LN enlargement    Antibiotics   Anti-infectives (From admission, onward)   Start     Dose/Rate Route Frequency Ordered Stop   06/12/17 0800  clindamycin (CLEOCIN) IVPB 600 mg     600 mg 100 mL/hr over 30 Minutes Intravenous  Once 06/11/17 1533 06/12/17 0808      Medications   Scheduled Meds: . atorvastatin  80 mg Oral q1800  . carvedilol  12.5 mg Oral BID WC  . docusate sodium  100 mg Oral BID  . [START ON 06/13/2017] enoxaparin (LOVENOX) injection  30 mg Subcutaneous Q24H  . ergocalciferol  50,000 Units Oral Weekly  . isosorbide mononitrate  30 mg Oral Daily  . levothyroxine  25 mcg Oral QAC breakfast  . meclizine  12.5 mg Oral BID  . mouth rinse  15 mL Mouth Rinse BID  . methylPREDNISolone  4 mg Oral 4X daily taper  . multivitamin with minerals  1 tablet Oral Daily  . pantoprazole  40 mg Oral Daily  . sertraline  50 mg Oral Daily   Continuous Infusions: . sodium chloride  Stopped (06/11/17 1204)   PRN Meds:.acetaminophen **OR** acetaminophen, bisacodyl, HYDROcodone-acetaminophen, ketorolac, ondansetron **OR** ondansetron (ZOFRAN) IV, traZODone   Data Review:   Micro Results Recent Results (from the past 240 hour(s))  Urine culture     Status: None   Collection Time: 06/08/17  9:59 PM  Result Value Ref Range Status   Specimen Description   Final    URINE, RANDOM Performed at [facility], [address] [place], [place] [address]    Special Requests   Final    NONE Performed at [facility], [address], [place], [place] [address]    Culture   Final    NO GROWTH Performed at [facility], [place] [address] [place],  [address]    Report Status 06/10/2017 FINAL  Final    Radiology Reports Dg Chest 2 View  Result Date: 06/08/2017 CLINICAL DATA:  Back pain radiating to the ribs EXAM: CHEST  2 VIEW COMPARISON:  Chest radiograph 06/02/2017 FINDINGS: There is unchanged cardiomegaly and calcific aortic atherosclerosis. Pulmonary vascular congestion is unchanged. There is an enchondroma within the proximal left humerus. No focal consolidation, pleural effusion or pneumothorax. There is a compression fracture of a midthoracic vertebral body that is new compared to 06/01/2017. IMPRESSION: 1. New compression fracture of a midthoracic vertebral body since 06/01/2017, in keeping with reported midthoracic back pain. 2. Unchanged pulmonary vascular congestion with cardiomegaly and calcific aortic atherosclerosis (ICD10-I70.0). No overt pulmonary edema. Electronically Signed   By: Ulyses Jarred M.D.   On: 06/08/2017 22:07   Dg Thoracic Spine 2 View  Result Date: 06/12/2017 CLINICAL DATA:  Thoracic spinal augmentation.  T6 fracture. EXAM: THORACIC SPINE 2 VIEWS; DG C-ARM 61-120 MIN COMPARISON:  Thoracic spine CT 06/09/2017. FLUOROSCOPY TIME:  1 minutes and 59 seconds C-arm fluoroscopic images were obtained intraoperatively and  submitted for post operative interpretation. Please see the performing provider's procedural report for the fluoroscopy time utilized. FINDINGS: Four spot fluoroscopic images of the thoracic spine demonstrate sequential midthoracic kyphoplasty. Numbering limited by small field-of-view. Final images  demonstrate methylmethacrylate extending to the posterior margin of the vertebral body on the lateral view, with possible extension into the pedicle. No demonstrated complication. IMPRESSION: Intraoperative views during midthoracic (T6 based on prior CT) kyphoplasty. Electronically Signed   By: Richardean Sale M.D.   On: 06/12/2017 08:57   Ct Angio Chest Pe W And/or Wo Contrast  Result Date: 06/01/2017 CLINICAL DATA:  Short of breath EXAM: CT ANGIOGRAPHY CHEST WITH CONTRAST TECHNIQUE: Multidetector CT imaging of the chest was performed using the standard protocol during bolus administration of intravenous contrast. Multiplanar CT image reconstructions and MIPs were obtained to evaluate the vascular anatomy. CONTRAST:  15mL ISOVUE-370 IOPAMIDOL (ISOVUE-370) INJECTION 76% COMPARISON:  Chest 06/01/2017 FINDINGS: Cardiovascular: Negative for pulmonary embolism. Pulmonary artery enlargement bilaterally compatible with pulmonary artery hypertension Extensive atherosclerotic calcification aortic arch and coronary artery. Heart size within normal limits. No pericardial effusion Mediastinum/Nodes: Negative for mass or adenopathy Lungs/Pleura: Scarring right medial upper lobe. Chronic lung disease. Negative for infiltrate or effusion. Partially calcified nodule right lung base unchanged from prior PET-CT 03/13/2017 Upper Abdomen: Cirrhosis of the liver. No ascites. Spleen not enlarged Musculoskeletal: Negative Review of the MIP images confirms the above findings. IMPRESSION: Negative for pulmonary embolism.  Pulmonary artery hypertension Diffuse atherosclerotic disease including the coronary artery. Cirrhosis Aortic  Atherosclerosis (ICD10-I70.0). Electronically Signed   By: Franchot Gallo M.D.   On: 06/01/2017 21:02   Ct Thoracic Spine Wo Contrast  Result Date: 06/09/2017 CLINICAL DATA:  Marked worsening in thoracic spine pain. The patient has a thoracic compression fracture seen on prior plain films the chest. The fracture is new since 06/01/2017. EXAM: CT THORACIC SPINE WITHOUT CONTRAST TECHNIQUE: Multidetector CT images of the thoracic were obtained using the standard protocol without intravenous contrast. COMPARISON:  PA and lateral chest 06/08/2017.  CT chest 06/01/2017. FINDINGS: Alignment: Maintained. Vertebrae: As seen on the comparison plain films, the patient has a compression fracture of T6 with vertebral body height loss of up to approximately 90%. There is little to no bony retropulsion and the fracture does not involve the posterior elements. No other fracture is identified. No focal bony lesion is seen. Paraspinal and other soft tissues: Extensive atherosclerotic vascular disease is present. Imaged lung parenchyma is clear with emphysematous change noted. Disc levels: T6-7: Shallow disc bulge without stenosis. T8-9:  Shallow central protrusion without stenosis. 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 L1-2: Ossification of the posterior longitudinal ligament narrows the ventral thecal sac. Except as described, intervertebral disc spaces are unremarkable. IMPRESSION: Acute compression fracture of T6 with vertebral body height loss of up to 90%. No bony retropulsion or involvement of the posterior elements. Mild thoracic spondylosis as described above. Emphysema. Atherosclerosis. Electronically Signed   By: Inge Rise M.D.   On: 06/09/2017 12:20   Ct Abdomen Pelvis W Contrast  Result Date: 06/05/2017 CLINICAL DATA:  Bilateral back pain radiating to anterior abdomen with nausea and vomiting as well as diarrhea 4 days. EXAM: CT ABDOMEN AND  PELVIS WITH CONTRAST TECHNIQUE: Multidetector CT imaging of the abdomen and pelvis was performed using the standard protocol following bolus administration of intravenous contrast. CONTRAST:  96mL ISOVUE-300 IOPAMIDOL (ISOVUE-300) INJECTION 61% COMPARISON:  03/04/2017 and 02/24/2017 FINDINGS: Lower chest: 6 mm nodule over the medial right lower lobe unchanged. Scarring with mild nodularity over the posterolateral right lower lobe without significant change. Calcification of the right coronary artery and adjacent the mitral valve annulus. Mild prominence of the heart. Hepatobiliary: Nodular  contour to the liver with stable hypodense nodules over the right lobe previously evaluated by a PET-CT and found to be non hypermetabolic. Gallbladder and biliary tree are normal. Pancreas: Normal. Spleen: Subcentimeter hypodensity over the inferior aspect of the spleen unchanged likely a cyst or hemangioma. Adrenals/Urinary Tract: Adrenal glands are normal. Kidneys are normal in size without hydronephrosis or nephrolithiasis. Ureters are within normal. Bladder is normal. Stomach/Bowel: Stomach and small bowel are normal. The appendix is normal. Colon is unremarkable. Vascular/Lymphatic: There is mild calcified plaque over the abdominal aorta and iliac arteries. No adenopathy. Reproductive: Within normal. Other: No free fluid or focal inflammatory change. Musculoskeletal: Mild degenerate change of the spine and hips. IMPRESSION: No acute findings in the abdomen/pelvis. Evidence of known cirrhosis with stable right lobe nodules. Scar in the right base. 6 mm nodule over the right lower lobe unchanged. Recommend followup CT 6 months. This recommendation follows the consensus statement: Guidelines for Management of Small Pulmonary Nodules Detected on CT Scans: A Statement from the [employer] as published in Radiology 2005; 237:395-400. Online at: [email or web address].[email or web address].com/. Aortic Atherosclerosis  (ICD10-I70.0). Electronically Signed   By: Marin Olp M.D.   On: 06/05/2017 18:21   Portable Chest X-ray 1 View  Result Date: 06/02/2017 CLINICAL DATA:  CHF. EXAM: PORTABLE CHEST 1 VIEW COMPARISON:  06/01/2017 chest radiograph and CTA FINDINGS: The cardiac silhouette is borderline enlarged. Aortic atherosclerosis is noted. There is central pulmonary artery enlargement as described on CT. Slight interstitial prominence is similar to the prior study without overt alveolar edema. No sizable pleural effusion or pneumothorax is seen. A sclerotic lesion is again partially visualized in the proximal left humerus, benign in appearance. IMPRESSION: Mild pulmonary vascular congestion without overt edema. Electronically Signed   By: Logan Bores M.D.   On: 06/02/2017 09:16   Dg Chest Port 1 View  Result Date: 06/01/2017 CLINICAL DATA:  Short of breath EXAM: PORTABLE CHEST 1 VIEW COMPARISON:  03/02/2013 FINDINGS: Cardiac enlargement with mild vascular congestion. Negative for edema or effusion. Mild bibasilar atelectasis. Sclerotic benign appearing lesion proximal left humerus unchanged IMPRESSION: Mild bibasilar atelectasis. Pulmonary vascular congestion Atherosclerotic aorta Electronically Signed   By: Franchot Gallo M.D.   On: 06/01/2017 18:39   Dg C-arm 1-60 Min  Result Date: 06/12/2017 CLINICAL DATA:  Thoracic spinal augmentation.  T6 fracture. EXAM: THORACIC SPINE 2 VIEWS; DG C-ARM 61-120 MIN COMPARISON:  Thoracic spine CT 06/09/2017. FLUOROSCOPY TIME:  1 minutes and 59 seconds C-arm fluoroscopic images were obtained intraoperatively and submitted for post operative interpretation. Please see the performing provider's procedural report for the fluoroscopy time utilized. FINDINGS: Four spot fluoroscopic images of the thoracic spine demonstrate sequential midthoracic kyphoplasty. Numbering limited by small field-of-view. Final images demonstrate methylmethacrylate extending to the posterior margin of the  vertebral body on the lateral view, with possible extension into the pedicle. No demonstrated complication. IMPRESSION: Intraoperative views during midthoracic (T6 based on prior CT) kyphoplasty. Electronically Signed   By: Richardean Sale M.D.   On: 06/12/2017 08:57     CBC Recent Labs  Lab 06/05/17 1654 06/08/17 2127 06/09/17 0328 06/12/17 0438  WBC 5.5 8.1 5.8 7.0  HGB 12.2 13.0 12.2 12.8  HCT 36.3 38.6 35.4 38.1  PLT 213 277 245 292  MCV 91.2 90.9 90.0 92.2  MCH 30.7 30.7 31.2 30.9  MCHC 33.6 33.7 34.6 33.5  RDW 13.2 13.3 13.6 13.7    Chemistries  Recent Labs  Lab 06/08/17 2127 06/09/17 0328 06/10/17 0430 06/10/17 1445 06/11/17 0410  06/11/17 1137 06/12/17 0438  NA 127* 130* 129*  --  129* 131* 134*  K 4.9 4.1 5.4*  --  6.0* 5.4* 4.8  CL 92* 97* 98*  --  98* 100* 102  CO2 27 26 25   --  26 26 25   GLUCOSE 137* 106* 138*  --  146* 110* 121*  BUN 39* 36* 46*  --  47* 48* 45*  CREATININE 2.16* 1.78* 1.70*  --  1.51* 1.48* 1.29*  CALCIUM 9.4 9.0 8.8*  --  8.8* 9.1 9.4  MG  --   --   --  2.1  --   --   --   AST 40  --   --   --   --   --   --   ALT 19  --   --   --   --   --   --   ALKPHOS 45  --   --   --   --   --   --   BILITOT 0.9  --   --   --   --   --   --    ------------------------------------------------------------------------------------------------------------------ estimated creatinine clearance is 25.2 mL/min (A) (by C-G formula based on SCr of 1.29 mg/dL (H)). ------------------------------------------------------------------------------------------------------------------ No results for input(s): HGBA1C in the last 72 hours. ------------------------------------------------------------------------------------------------------------------ No results for input(s): CHOL, HDL, LDLCALC, TRIG, CHOLHDL, LDLDIRECT in the last 72 hours. ------------------------------------------------------------------------------------------------------------------ No results  for input(s): TSH, T4TOTAL, T3FREE, THYROIDAB in the last 72 hours.  Invalid input(s): FREET3 ------------------------------------------------------------------------------------------------------------------ No results for input(s): VITAMINB12, FOLATE, FERRITIN, TIBC, IRON, RETICCTPCT in the last 72 hours.  Coagulation profile No results for input(s): INR, PROTIME in the last 168 hours.  No results for input(s): DDIMER in the last 72 hours.  Cardiac Enzymes Recent Labs  Lab 06/09/17 0328 06/09/17 0924 06/09/17 1518  TROPONINI 0.08* 0.06* 0.06*   ------------------------------------------------------------------------------------------------------------------ Invalid input(s): POCBNP    Assessment & Plan   83 year old female with a known history of chronic systolic CHF, last EF of 73-22%, had an NSTEMI a week ago now is being admitted for new compression fracture of mid thoracic vertebral body and severe hyponatremia  *t6 compression fracture: Post kyphoplasty, physical therapy recommended rehab.  Pending placement.  *Severe hyponatremia -Likely from overdiuresis -Sodium improved with gentle hydration Restart Lasix tomorrow.  *Acute on chronic kidney disease stage III -Likely due to overdiuresis -improved with ivf -Monitor kidney function hold off ACE inhibitor and Lasix  *Chronic systolic CHF -EF of 02-54% -Well compensated at this time -Monitor ON telemetry BP is low so hold Imdur. Hyperkalemia: Kayexalate received, repeat potassium better.  Monitor on telemetry,     Code Status Orders  (From admission, onward)        Start     Ordered   06/09/17 0008  Full code  Continuous     06/09/17 0008    Code Status History    Date Active Date Inactive Code Status Order ID Comments User Context   06/01/2017 23:10 06/04/2017 18:03 DNR [id number]  Gorden Harms, MD Inpatient   02/25/2017 00:00 02/26/2017 02:45 Full Code [id number]  Lance Coon, MD ED    02/18/2017 16:20 02/20/2017 18:54 Full Code [id number]  Hillary Bow, MD ED    Advance Directive Documentation     Most Recent Value  Type of Advance Directive  Living will  Pre-existing out of facility DNR order (yellow form or pink MOST form)  No data  "MOST" Form in Place?  No  data           Consults orthopedics  DVT Prophylaxis discontinue heparin for possible procedure  Lab Results  Component Value Date   PLT 292 06/12/2017     Time Spent in minutes 35 minutes greater than 50% of time spent in care coordination and counseling patient regarding the condition and plan of care.   Epifanio Lesches M.D on 06/12/2017 at 3:15 PM  Between 7am to 6pm - Pager - [phone]  After 6pm go to [email or web address] - password [email or web address]   Office  [phone]

## 2017-06-12 NOTE — Progress Notes (Addendum)
[place] at [facility]                                                                                                                                                                                  Patient Demographics   Jacqueline [name], is a 83 y.o. female, DOB - Aug 05, 1932, CXK:[id number]  Admit date - 06/08/2017   Admitting Physician Max Sane, MD  Outpatient Primary MD for the patient is Tracie Harrier, MD   LOS - 3  Subjective: Patient seen at bedside, having severe upper back pain, waiting for orthopedic evaluation, Review of Systems:   CONSTITUTIONAL: No documented fever. No fatigue, weakness. No weight gain, no weight loss.  EYES: No blurry or double vision.  ENT: No tinnitus. No postnasal drip. No redness of the oropharynx.  RESPIRATORY: No cough, no wheeze, no hemoptysis. No dyspnea.  CARDIOVASCULAR: No chest pain. No orthopnea. No palpitations. No syncope.  GASTROINTESTINAL: No nausea, no vomiting or diarrhea. No abdominal pain. No melena or hematochezia.  GENITOURINARY: No dysuria or hematuria.  ENDOCRINE: No polyuria or nocturia. No heat or cold intolerance.  HEMATOLOGY: No anemia. No bruising. No bleeding.  INTEGUMENTARY: No rashes. No lesions.  MUSCULOSKELETAL: Positive back pain NEUROLOGIC: No numbness, tingling, or ataxia. No seizure-type activity.  PSYCHIATRIC: No anxiety. No insomnia. No ADD.    Vitals:   Vitals:   06/12/17 0915 06/12/17 0930 06/12/17 0936 06/12/17 0953  BP: (!) 141/57 (!) 126/50  (!) 154/53  Pulse: 70 70  73  Resp: 16 17  14   Temp:   98.4 F (36.9 C) 98.2 F (36.8 C)  TempSrc:    Oral  SpO2: 99% 98%  100%  Weight:      Height:        Wt Readings from Last 3 Encounters:  06/12/17 57.1 kg (125 lb 14.4 oz)  06/05/17 58.1 kg (128 lb)  06/04/17 58.1 kg (128 lb)     Intake/Output Summary (Last 24 hours) at 06/12/2017 1513 Last data filed at 06/12/2017 1355 Gross per 24 hour  Intake 680 ml  Output  802 ml  Net -122 ml    Physical Exam:   GENERAL: Pleasant-appearing in no apparent distress.  HEAD, EYES, EARS, NOSE AND THROAT: Atraumatic, normocephalic. Extraocular muscles are intact. Pupils equal and reactive to light. Sclerae anicteric. No conjunctival injection. No oro-pharyngeal erythema.  NECK: Supple. There is no jugular venous distention. No bruits, no lymphadenopathy, no thyromegaly.  HEART: Regular rate and rhythm,. No murmurs, no rubs, no clicks.  LUNGS: Clear to auscultation bilaterally. No rales or rhonchi. No wheezes.  ABDOMEN: Soft, flat, nontender, nondistended. Has good bowel sounds. No hepatosplenomegaly appreciated.  EXTREMITIES: No evidence of any cyanosis, clubbing, or peripheral edema.  +2 pedal and radial pulses bilaterally.  NEUROLOGIC: The patient is alert, awake, and oriented x3 with no focal motor or sensory deficits appreciated bilaterally.  SKIN: Moist and warm with no rashes appreciated.  Psych: Not anxious, depressed LN: No inguinal LN enlargement    Antibiotics   Anti-infectives (From admission, onward)   Start     Dose/Rate Route Frequency Ordered Stop   06/12/17 0800  clindamycin (CLEOCIN) IVPB 600 mg     600 mg 100 mL/hr over 30 Minutes Intravenous  Once 06/11/17 1533 06/12/17 0808      Medications   Scheduled Meds: . atorvastatin  80 mg Oral q1800  . carvedilol  12.5 mg Oral BID WC  . docusate sodium  100 mg Oral BID  . [START ON 06/13/2017] enoxaparin (LOVENOX) injection  30 mg Subcutaneous Q24H  . ergocalciferol  50,000 Units Oral Weekly  . isosorbide mononitrate  30 mg Oral Daily  . levothyroxine  25 mcg Oral QAC breakfast  . meclizine  12.5 mg Oral BID  . mouth rinse  15 mL Mouth Rinse BID  . methylPREDNISolone  4 mg Oral 4X daily taper  . multivitamin with minerals  1 tablet Oral Daily  . pantoprazole  40 mg Oral Daily  . sertraline  50 mg Oral Daily   Continuous Infusions: . sodium chloride Stopped (06/11/17 1204)   PRN  Meds:.acetaminophen **OR** acetaminophen, bisacodyl, HYDROcodone-acetaminophen, ketorolac, ondansetron **OR** ondansetron (ZOFRAN) IV, traZODone   Data Review:   Micro Results Recent Results (from the past 240 hour(s))  Urine culture     Status: None   Collection Time: 06/08/17  9:59 PM  Result Value Ref Range Status   Specimen Description   Final    URINE, RANDOM Performed at [facility], [address] [place], [place] [address]    Special Requests   Final    NONE Performed at [facility], [address], [place], [place] [address]    Culture   Final    NO GROWTH Performed at [facility], [place] [address], [place], [place] [address]    Report Status 06/10/2017 FINAL  Final    Radiology Reports Dg Chest 2 View  Result Date: 06/08/2017 CLINICAL DATA:  Back pain radiating to the ribs EXAM: CHEST  2 VIEW COMPARISON:  Chest radiograph 06/02/2017 FINDINGS: There is unchanged cardiomegaly and calcific aortic atherosclerosis. Pulmonary vascular congestion is unchanged. There is an enchondroma within the proximal left humerus. No focal consolidation, pleural effusion or pneumothorax. There is a compression fracture of a midthoracic vertebral body that is new compared to 06/01/2017. IMPRESSION: 1. New compression fracture of a midthoracic vertebral body since 06/01/2017, in keeping with reported midthoracic back pain. 2. Unchanged pulmonary vascular congestion with cardiomegaly and calcific aortic atherosclerosis (ICD10-I70.0). No overt pulmonary edema. Electronically Signed   By: Ulyses Jarred M.D.   On: 06/08/2017 22:07   Dg Thoracic Spine 2 View  Result Date: 06/12/2017 CLINICAL DATA:  Thoracic spinal augmentation.  T6 fracture. EXAM: THORACIC SPINE 2 VIEWS; DG C-ARM 61-120 MIN COMPARISON:  Thoracic spine CT 06/09/2017. FLUOROSCOPY TIME:  1 minutes and 59 seconds C-arm fluoroscopic images were obtained intraoperatively and submitted for post operative  interpretation. Please see the performing provider's procedural report for the fluoroscopy time utilized. FINDINGS: Four spot fluoroscopic images of the thoracic spine demonstrate sequential midthoracic kyphoplasty. Numbering limited by small field-of-view. Final images demonstrate methylmethacrylate extending to the posterior  margin of the vertebral body on the lateral view, with possible extension into the pedicle. No demonstrated complication. IMPRESSION: Intraoperative views during midthoracic (T6 based on prior CT) kyphoplasty. Electronically Signed   By: Richardean Sale M.D.   On: 06/12/2017 08:57   Ct Angio Chest Pe W And/or Wo Contrast  Result Date: 06/01/2017 CLINICAL DATA:  Short of breath EXAM: CT ANGIOGRAPHY CHEST WITH CONTRAST TECHNIQUE: Multidetector CT imaging of the chest was performed using the standard protocol during bolus administration of intravenous contrast. Multiplanar CT image reconstructions and MIPs were obtained to evaluate the vascular anatomy. CONTRAST:  41mL ISOVUE-370 IOPAMIDOL (ISOVUE-370) INJECTION 76% COMPARISON:  Chest 06/01/2017 FINDINGS: Cardiovascular: Negative for pulmonary embolism. Pulmonary artery enlargement bilaterally compatible with pulmonary artery hypertension Extensive atherosclerotic calcification aortic arch and coronary artery. Heart size within normal limits. No pericardial effusion Mediastinum/Nodes: Negative for mass or adenopathy Lungs/Pleura: Scarring right medial upper lobe. Chronic lung disease. Negative for infiltrate or effusion. Partially calcified nodule right lung base unchanged from prior PET-CT 03/13/2017 Upper Abdomen: Cirrhosis of the liver. No ascites. Spleen not enlarged Musculoskeletal: Negative Review of the MIP images confirms the above findings. IMPRESSION: Negative for pulmonary embolism.  Pulmonary artery hypertension Diffuse atherosclerotic disease including the coronary artery. Cirrhosis Aortic Atherosclerosis (ICD10-I70.0).  Electronically Signed   By: Franchot Gallo M.D.   On: 06/01/2017 21:02   Ct Thoracic Spine Wo Contrast  Result Date: 06/09/2017 CLINICAL DATA:  Marked worsening in thoracic spine pain. The patient has a thoracic compression fracture seen on prior plain films the chest. The fracture is new since 06/01/2017. EXAM: CT THORACIC SPINE WITHOUT CONTRAST TECHNIQUE: Multidetector CT images of the thoracic were obtained using the standard protocol without intravenous contrast. COMPARISON:  PA and lateral chest 06/08/2017.  CT chest 06/01/2017. FINDINGS: Alignment: Maintained. Vertebrae: As seen on the comparison plain films, the patient has a compression fracture of T6 with vertebral body height loss of up to approximately 90%. There is little to no bony retropulsion and the fracture does not involve the posterior elements. No other fracture is identified. No focal bony lesion is seen. Paraspinal and other soft tissues: Extensive atherosclerotic vascular disease is present. Imaged lung parenchyma is clear with emphysematous change noted. Disc levels: T6-7: Shallow disc bulge without stenosis. T8-9:  Shallow central protrusion without stenosis. 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 L1-2: Ossification of the posterior longitudinal ligament narrows the ventral thecal sac. Except as described, intervertebral disc spaces are unremarkable. IMPRESSION: Acute compression fracture of T6 with vertebral body height loss of up to 90%. No bony retropulsion or involvement of the posterior elements. Mild thoracic spondylosis as described above. Emphysema. Atherosclerosis. Electronically Signed   By: Inge Rise M.D.   On: 06/09/2017 12:20   Ct Abdomen Pelvis W Contrast  Result Date: 06/05/2017 CLINICAL DATA:  Bilateral back pain radiating to anterior abdomen with nausea and vomiting as well as diarrhea 4 days. EXAM: CT ABDOMEN AND PELVIS WITH CONTRAST TECHNIQUE:  Multidetector CT imaging of the abdomen and pelvis was performed using the standard protocol following bolus administration of intravenous contrast. CONTRAST:  39mL ISOVUE-300 IOPAMIDOL (ISOVUE-300) INJECTION 61% COMPARISON:  03/04/2017 and 02/24/2017 FINDINGS: Lower chest: 6 mm nodule over the medial right lower lobe unchanged. Scarring with mild nodularity over the posterolateral right lower lobe without significant change. Calcification of the right coronary artery and adjacent the mitral valve annulus. Mild prominence of the heart. Hepatobiliary: Nodular contour to the liver with stable  hypodense nodules over the right lobe previously evaluated by a PET-CT and found to be non hypermetabolic. Gallbladder and biliary tree are normal. Pancreas: Normal. Spleen: Subcentimeter hypodensity over the inferior aspect of the spleen unchanged likely a cyst or hemangioma. Adrenals/Urinary Tract: Adrenal glands are normal. Kidneys are normal in size without hydronephrosis or nephrolithiasis. Ureters are within normal. Bladder is normal. Stomach/Bowel: Stomach and small bowel are normal. The appendix is normal. Colon is unremarkable. Vascular/Lymphatic: There is mild calcified plaque over the abdominal aorta and iliac arteries. No adenopathy. Reproductive: Within normal. Other: No free fluid or focal inflammatory change. Musculoskeletal: Mild degenerate change of the spine and hips. IMPRESSION: No acute findings in the abdomen/pelvis. Evidence of known cirrhosis with stable right lobe nodules. Scar in the right base. 6 mm nodule over the right lower lobe unchanged. Recommend followup CT 6 months. This recommendation follows the consensus statement: Guidelines for Management of Small Pulmonary Nodules Detected on CT Scans: A Statement from the [employer] as published in Radiology 2005; 237:395-400. Online at: https://www.arnold.com/. Aortic Atherosclerosis (ICD10-I70.0). Electronically  Signed   By: Marin Olp M.D.   On: 06/05/2017 18:21   Portable Chest X-ray 1 View  Result Date: 06/02/2017 CLINICAL DATA:  CHF. EXAM: PORTABLE CHEST 1 VIEW COMPARISON:  06/01/2017 chest radiograph and CTA FINDINGS: The cardiac silhouette is borderline enlarged. Aortic atherosclerosis is noted. There is central pulmonary artery enlargement as described on CT. Slight interstitial prominence is similar to the prior study without overt alveolar edema. No sizable pleural effusion or pneumothorax is seen. A sclerotic lesion is again partially visualized in the proximal left humerus, benign in appearance. IMPRESSION: Mild pulmonary vascular congestion without overt edema. Electronically Signed   By: Logan Bores M.D.   On: 06/02/2017 09:16   Dg Chest Port 1 View  Result Date: 06/01/2017 CLINICAL DATA:  Short of breath EXAM: PORTABLE CHEST 1 VIEW COMPARISON:  03/02/2013 FINDINGS: Cardiac enlargement with mild vascular congestion. Negative for edema or effusion. Mild bibasilar atelectasis. Sclerotic benign appearing lesion proximal left humerus unchanged IMPRESSION: Mild bibasilar atelectasis. Pulmonary vascular congestion Atherosclerotic aorta Electronically Signed   By: Franchot Gallo M.D.   On: 06/01/2017 18:39   Dg C-arm 1-60 Min  Result Date: 06/12/2017 CLINICAL DATA:  Thoracic spinal augmentation.  T6 fracture. EXAM: THORACIC SPINE 2 VIEWS; DG C-ARM 61-120 MIN COMPARISON:  Thoracic spine CT 06/09/2017. FLUOROSCOPY TIME:  1 minutes and 59 seconds C-arm fluoroscopic images were obtained intraoperatively and submitted for post operative interpretation. Please see the performing provider's procedural report for the fluoroscopy time utilized. FINDINGS: Four spot fluoroscopic images of the thoracic spine demonstrate sequential midthoracic kyphoplasty. Numbering limited by small field-of-view. Final images demonstrate methylmethacrylate extending to the posterior margin of the vertebral body on the lateral  view, with possible extension into the pedicle. No demonstrated complication. IMPRESSION: Intraoperative views during midthoracic (T6 based on prior CT) kyphoplasty. Electronically Signed   By: Richardean Sale M.D.   On: 06/12/2017 08:57     CBC Recent Labs  Lab 06/05/17 1654 06/08/17 2127 06/09/17 0328 06/12/17 0438  WBC 5.5 8.1 5.8 7.0  HGB 12.2 13.0 12.2 12.8  HCT 36.3 38.6 35.4 38.1  PLT 213 277 245 292  MCV 91.2 90.9 90.0 92.2  MCH 30.7 30.7 31.2 30.9  MCHC 33.6 33.7 34.6 33.5  RDW 13.2 13.3 13.6 13.7    Chemistries  Recent Labs  Lab 06/08/17 2127 06/09/17 0328 06/10/17 0430 06/10/17 1445 06/11/17 0410 06/11/17 1137 06/12/17 0438  NA  127* 130* 129*  --  129* 131* 134*  K 4.9 4.1 5.4*  --  6.0* 5.4* 4.8  CL 92* 97* 98*  --  98* 100* 102  CO2 27 26 25   --  26 26 25   GLUCOSE 137* 106* 138*  --  146* 110* 121*  BUN 39* 36* 46*  --  47* 48* 45*  CREATININE 2.16* 1.78* 1.70*  --  1.51* 1.48* 1.29*  CALCIUM 9.4 9.0 8.8*  --  8.8* 9.1 9.4  MG  --   --   --  2.1  --   --   --   AST 40  --   --   --   --   --   --   ALT 19  --   --   --   --   --   --   ALKPHOS 45  --   --   --   --   --   --   BILITOT 0.9  --   --   --   --   --   --    ------------------------------------------------------------------------------------------------------------------ estimated creatinine clearance is 25.2 mL/min (A) (by C-G formula based on SCr of 1.29 mg/dL (H)). ------------------------------------------------------------------------------------------------------------------ No results for input(s): HGBA1C in the last 72 hours. ------------------------------------------------------------------------------------------------------------------ No results for input(s): CHOL, HDL, LDLCALC, TRIG, CHOLHDL, LDLDIRECT in the last 72 hours. ------------------------------------------------------------------------------------------------------------------ No results for input(s): TSH, T4TOTAL,  T3FREE, THYROIDAB in the last 72 hours.  Invalid input(s): FREET3 ------------------------------------------------------------------------------------------------------------------ No results for input(s): VITAMINB12, FOLATE, FERRITIN, TIBC, IRON, RETICCTPCT in the last 72 hours.  Coagulation profile No results for input(s): INR, PROTIME in the last 168 hours.  No results for input(s): DDIMER in the last 72 hours.  Cardiac Enzymes Recent Labs  Lab 06/09/17 0328 06/09/17 0924 06/09/17 1518  TROPONINI 0.08* 0.06* 0.06*   ------------------------------------------------------------------------------------------------------------------ Invalid input(s): POCBNP    Assessment & Plan   83 year old female with a known history of chronic systolic CHF, last EF of 80-99%, had an NSTEMI a week ago now is being admitted for new compression fracture of mid thoracic vertebral body and severe hyponatremia  *t6 compression fracture: Waiting for orthopedic evaluation to see if patient needs kyphoplasty. Discussed with patient's daughter, continue pain medicines, muscle relaxant, prednisone  *Severe hyponatremia -Likely from overdiuresis -Sodium improved with gentle hydration  Lasix on hold  *Acute on chronic kidney disease stage III -Likely due to overdiuresis -improved with ivf -Monitor kidney function hold off ACE inhibitor and Lasix  *Chronic systolic CHF -EF of 83-38% -Well compensated at this time -Monitor ON telemetry BP is low so hold Imdur. Hyperkalemia: Kayexalate received, repeat potassium better.     Code Status Orders  (From admission, onward)        Start     Ordered   06/09/17 0008  Full code  Continuous     06/09/17 0008    Code Status History    Date Active Date Inactive Code Status Order ID Comments User Context   06/01/2017 23:10 06/04/2017 18:03 DNR [id number]  Gorden Harms, MD Inpatient   02/25/2017 00:00 02/26/2017 02:45 Full Code [id number]   Lance Coon, MD ED   02/18/2017 16:20 02/20/2017 18:54 Full Code [id number]  Hillary Bow, MD ED    Advance Directive Documentation     Most Recent Value  Type of Advance Directive  Living will  Pre-existing out of facility DNR order (yellow form or pink MOST form)  No data  "MOST" Form in Place?  No data           Consults orthopedics  DVT Prophylaxis discontinue heparin for possible procedure  Lab Results  Component Value Date   PLT 292 06/12/2017     Time Spent in minutes 35 minutes greater than 50% of time spent in care coordination and counseling patient regarding the condition and plan of care.   Epifanio Lesches M.D on 06/12/2017 at 3:13 PM  Between 7am to 6pm - Pager - [phone]  After 6pm go to [email or web address] - password [email or web address]   Office  [phone]

## 2017-06-13 LAB — BASIC METABOLIC PANEL
ANION GAP: 6 (ref 5–15)
BUN: 48 mg/dL — ABNORMAL HIGH (ref 6–20)
CALCIUM: 9.1 mg/dL (ref 8.9–10.3)
CHLORIDE: 99 mmol/L — AB (ref 101–111)
CO2: 27 mmol/L (ref 22–32)
Creatinine, Ser: 1.21 mg/dL — ABNORMAL HIGH (ref 0.44–1.00)
GFR calc non Af Amer: 41 mL/min — ABNORMAL LOW (ref >60)
GFR, EST AFRICAN AMERICAN: 47 mL/min — AB (ref >60)
Glucose, Bld: 104 mg/dL — ABNORMAL HIGH (ref 65–99)
Potassium: 5.1 mmol/L (ref 3.5–5.1)
SODIUM: 132 mmol/L — AB (ref 135–145)

## 2017-06-13 LAB — GLUCOSE, CAPILLARY: GLUCOSE-CAPILLARY: 94 mg/dL (ref 65–99)

## 2017-06-13 MED ORDER — SODIUM CHLORIDE 0.9% FLUSH
3.0000 mL | Freq: Two times a day (BID) | INTRAVENOUS | Status: DC
  Administered 2017-06-13 – 2017-06-15 (×5): 3 mL via INTRAVENOUS

## 2017-06-13 MED ORDER — FUROSEMIDE 20 MG PO TABS
20.0000 mg | ORAL_TABLET | Freq: Every day | ORAL | Status: DC
  Administered 2017-06-13 – 2017-06-15 (×3): 20 mg via ORAL
  Filled 2017-06-13 (×3): qty 1

## 2017-06-13 NOTE — Progress Notes (Signed)
Physical Therapy Treatment Patient Details Name: Jacqueline [name] MRN: [id number] DOB: May 05, 1933 Today's Date: 06/13/2017    History of Present Illness Pt is a 83 y/o F who presented with upper back pain.  Pt had an NSTEMI ~1 wk PTA.  Pt is now on 2L O2 at all times.  She is being treated for UTI.  Chest x-ray showed new compression fx T6.  Pt now s/p T6 kyphoplasty.  Pt's PMH includes CHF.    PT Comments    Pt in chair.  Pt and daughter stated she had just returned from walking with nursing "almost to the desk".  Declined further gait at this time but agreed to exercises as described below.  Overall tolerated well.  Balance and safety awareness deficits remain.  She continues to need min verbal cues for hand placements and +1 assist for balance during standing tasks.     Follow Up Recommendations  SNF     Equipment Recommendations  None recommended by PT    Recommendations for Other Services       Precautions / Restrictions Precautions Precautions: Fall Restrictions Weight Bearing Restrictions: No Other Position/Activity Restrictions: bend, lift, twist    Mobility  Bed Mobility                  Transfers Overall transfer level: Needs assistance Equipment used: Rolling walker (2 wheeled) Transfers: Sit to/from Stand Sit to Stand: Min guard;Min assist            Ambulation/Gait                 Stairs            Wheelchair Mobility    Modified Rankin (Stroke Patients Only)       Balance Overall balance assessment: Needs assistance Sitting-balance support: No upper extremity supported;Feet supported Sitting balance-Leahy Scale: Good     Standing balance support: Bilateral upper extremity supported;During functional activity Standing balance-Leahy Scale: Poor Standing balance comment: Relies on UE support for static and dynamic activities                            Cognition Arousal/Alertness: Awake/alert Behavior During  Therapy: WFL for tasks assessed/performed Overall Cognitive Status: Within Functional Limits for tasks assessed                                        Exercises Other Exercises Other Exercises: seated arom x 5 LAQ, marches and ankle pumps Other Exercises: standing marches toe raises x 10    General Comments        Pertinent Vitals/Pain Pain Assessment: No/denies pain    Home Living                      Prior Function            PT Goals (current goals can now be found in the care plan section) Progress towards PT goals: Progressing toward goals    Frequency    7X/week      PT Plan Current plan remains appropriate    Co-evaluation              AM-PAC PT "6 Clicks" Daily Activity  Outcome Measure  Difficulty turning over in bed (including adjusting bedclothes, sheets and blankets)?: A Little Difficulty moving from lying on back to  sitting on the side of the bed? : A Little Difficulty sitting down on and standing up from a chair with arms (e.g., wheelchair, bedside commode, etc,.)?: A Little Help needed moving to and from a bed to chair (including a wheelchair)?: A Little Help needed walking in hospital room?: A Little Help needed climbing 3-5 steps with a railing? : A Lot 6 Click Score: 17    End of Session Equipment Utilized During Treatment: Gait belt;Oxygen Activity Tolerance: Patient limited by fatigue;Patient tolerated treatment well Patient left: in chair;with call bell/phone within reach;with chair alarm set;with family/visitor present         Time: 1017-5102 PT Time Calculation (min) (ACUTE ONLY): 10 min  Charges:  $Therapeutic Exercise: 8-22 mins                    G Codes:        Chesley Noon, PTA 06/13/17, 3:09 PM

## 2017-06-13 NOTE — Progress Notes (Signed)
Pt. Slept well throughout the night. HR ran SR throughout the night in the 60's.

## 2017-06-13 NOTE — Clinical Social Work Note (Signed)
CSW spoke with the patient's daughter about bed offer from [facility] (only offer at this time). The family has questions about home health and has indicated that they are not interested in the offer from [facility]. CSW will alert RNCM to contact the family when able. CSW is following.  Santiago Bumpers, MSW, Latanya Presser [phone]

## 2017-06-13 NOTE — Progress Notes (Signed)
[place] at [facility]                                                                                                                                                                                  Patient Demographics   Jacqueline [name], is a 83 y.o. female, DOB - [birth year], DEY:[id number]  Admit date - 06/08/2017   Admitting Physician Max Sane, MD  Outpatient Primary MD for the patient is Tracie Harrier, MD   LOS - 4  Subjective: Complains of upper back pain around 6 out of 10 in severity, wants to get pain medicine.  No shortness of breath or chest pain.  Family declined the offer from [facility].  Review of Systems:   CONSTITUTIONAL: No documented fever. No fatigue, weakness. No weight gain, no weight loss.  EYES: No blurry or double vision.  ENT: No tinnitus. No postnasal drip. No redness of the oropharynx.  RESPIRATORY: No cough, no wheeze, no hemoptysis. No dyspnea.  CARDIOVASCULAR: No chest pain. No orthopnea. No palpitations. No syncope.  GASTROINTESTINAL: No nausea, no vomiting or diarrhea. No abdominal pain. No melena or hematochezia.  GENITOURINARY: No dysuria or hematuria.  ENDOCRINE: No polyuria or nocturia. No heat or cold intolerance.  HEMATOLOGY: No anemia. No bruising. No bleeding.  INTEGUMENTARY: No rashes. No lesions.  MUSCULOSKELETAL: Positive back pain NEUROLOGIC: No numbness, tingling, or ataxia. No seizure-type activity.  PSYCHIATRIC: No anxiety. No insomnia. No ADD.    Vitals:   Vitals:   06/12/17 1625 06/12/17 1925 06/13/17 0358 06/13/17 0754  BP: (!) 127/58 (!) 105/42 (!) 153/50 (!) 145/52  Pulse: 68 65 64 71  Resp: 18 17 17 18   Temp: (!) 97.3 F (36.3 C) 98.5 F (36.9 C) 98.1 F (36.7 C) 98 F (36.7 C)  TempSrc: Oral Oral Oral Oral  SpO2: 100% 100% 98% 100%  Weight:   57.4 kg (126 lb 8 oz)   Height:        Wt Readings from Last 3 Encounters:  06/13/17 57.4 kg (126 lb 8 oz)  06/05/17 58.1 kg (128 lb)   06/04/17 58.1 kg (128 lb)     Intake/Output Summary (Last 24 hours) at 06/13/2017 1247 Last data filed at 06/13/2017 1140 Gross per 24 hour  Intake 240 ml  Output 825 ml  Net -585 ml    Physical Exam:   GENERAL: Pleasant-appearing in no apparent distress.  HEAD, EYES, EARS, NOSE AND THROAT: Atraumatic, normocephalic. Extraocular muscles are intact. Pupils equal and reactive to light. Sclerae anicteric. No conjunctival injection. No oro-pharyngeal erythema.  NECK: Supple. There is no jugular venous distention. No bruits, no lymphadenopathy, no thyromegaly.  HEART: Regular rate  and rhythm,. No murmurs, no rubs, no clicks.  LUNGS: Clear to auscultation bilaterally. No rales or rhonchi. No wheezes.  ABDOMEN: Soft, flat, nontender, nondistended. Has good bowel sounds. No hepatosplenomegaly appreciated.  EXTREMITIES: No evidence of any cyanosis, clubbing, or peripheral edema.  +2 pedal and radial pulses bilaterally.  NEUROLOGIC: The patient is alert, awake, and oriented x3 with no focal motor or sensory deficits appreciated bilaterally.  SKIN: Moist and warm with no rashes appreciated.  Psych: Not anxious, depressed LN: No inguinal LN enlargement    Antibiotics   Anti-infectives (From admission, onward)   Start     Dose/Rate Route Frequency Ordered Stop   06/12/17 0800  clindamycin (CLEOCIN) IVPB 600 mg     600 mg 100 mL/hr over 30 Minutes Intravenous  Once 06/11/17 1533 06/12/17 0808      Medications   Scheduled Meds: . atorvastatin  80 mg Oral q1800  . carvedilol  12.5 mg Oral BID WC  . docusate sodium  100 mg Oral BID  . enoxaparin (LOVENOX) injection  30 mg Subcutaneous Q24H  . ergocalciferol  50,000 Units Oral Weekly  . furosemide  20 mg Oral Daily  . isosorbide mononitrate  30 mg Oral Daily  . levothyroxine  25 mcg Oral QAC breakfast  . meclizine  12.5 mg Oral BID  . mouth rinse  15 mL Mouth Rinse BID  . methylPREDNISolone  4 mg Oral 4X daily taper  . multivitamin  with minerals  1 tablet Oral Daily  . pantoprazole  40 mg Oral Daily  . sertraline  50 mg Oral Daily  . sodium chloride flush  3 mL Intravenous Q12H   Continuous Infusions: . sodium chloride Stopped (06/11/17 1204)   PRN Meds:.acetaminophen **OR** acetaminophen, bisacodyl, HYDROcodone-acetaminophen, ketorolac, ondansetron **OR** ondansetron (ZOFRAN) IV, traZODone   Data Review:   Micro Results Recent Results (from the past 240 hour(s))  Urine culture     Status: None   Collection Time: 06/08/17  9:59 PM  Result Value Ref Range Status   Specimen Description   Final    URINE, RANDOM Performed at [facility], [address] [place], [place] [address]    Special Requests   Final    NONE Performed at [facility], [address], [place], [place] [address]    Culture   Final    NO GROWTH Performed at [facility], [place] [address], [place], [place] [address]    Report Status 06/10/2017 FINAL  Final    Radiology Reports Dg Chest 2 View  Result Date: 06/08/2017 CLINICAL DATA:  Back pain radiating to the ribs EXAM: CHEST  2 VIEW COMPARISON:  Chest radiograph 06/02/2017 FINDINGS: There is unchanged cardiomegaly and calcific aortic atherosclerosis. Pulmonary vascular congestion is unchanged. There is an enchondroma within the proximal left humerus. No focal consolidation, pleural effusion or pneumothorax. There is a compression fracture of a midthoracic vertebral body that is new compared to 06/01/2017. IMPRESSION: 1. New compression fracture of a midthoracic vertebral body since 06/01/2017, in keeping with reported midthoracic back pain. 2. Unchanged pulmonary vascular congestion with cardiomegaly and calcific aortic atherosclerosis (ICD10-I70.0). No overt pulmonary edema. Electronically Signed   By: Ulyses Jarred M.D.   On: 06/08/2017 22:07   Dg Thoracic Spine 2 View  Result Date: 06/12/2017 CLINICAL DATA:  Thoracic spinal augmentation.  T6 fracture. EXAM:  THORACIC SPINE 2 VIEWS; DG C-ARM 61-120 MIN COMPARISON:  Thoracic spine CT 06/09/2017. FLUOROSCOPY TIME:  1 minutes and 59 seconds C-arm  fluoroscopic images were obtained intraoperatively and submitted for post operative interpretation. Please see the performing provider's procedural report for the fluoroscopy time utilized. FINDINGS: Four spot fluoroscopic images of the thoracic spine demonstrate sequential midthoracic kyphoplasty. Numbering limited by small field-of-view. Final images demonstrate methylmethacrylate extending to the posterior margin of the vertebral body on the lateral view, with possible extension into the pedicle. No demonstrated complication. IMPRESSION: Intraoperative views during midthoracic (T6 based on prior CT) kyphoplasty. Electronically Signed   By: Richardean Sale M.D.   On: 06/12/2017 08:57   Ct Angio Chest Pe W And/or Wo Contrast  Result Date: 06/01/2017 CLINICAL DATA:  Short of breath EXAM: CT ANGIOGRAPHY CHEST WITH CONTRAST TECHNIQUE: Multidetector CT imaging of the chest was performed using the standard protocol during bolus administration of intravenous contrast. Multiplanar CT image reconstructions and MIPs were obtained to evaluate the vascular anatomy. CONTRAST:  58mL ISOVUE-370 IOPAMIDOL (ISOVUE-370) INJECTION 76% COMPARISON:  Chest 06/01/2017 FINDINGS: Cardiovascular: Negative for pulmonary embolism. Pulmonary artery enlargement bilaterally compatible with pulmonary artery hypertension Extensive atherosclerotic calcification aortic arch and coronary artery. Heart size within normal limits. No pericardial effusion Mediastinum/Nodes: Negative for mass or adenopathy Lungs/Pleura: Scarring right medial upper lobe. Chronic lung disease. Negative for infiltrate or effusion. Partially calcified nodule right lung base unchanged from prior PET-CT 03/13/2017 Upper Abdomen: Cirrhosis of the liver. No ascites. Spleen not enlarged Musculoskeletal: Negative Review of the MIP images  confirms the above findings. IMPRESSION: Negative for pulmonary embolism.  Pulmonary artery hypertension Diffuse atherosclerotic disease including the coronary artery. Cirrhosis Aortic Atherosclerosis (ICD10-I70.0). Electronically Signed   By: Franchot Gallo M.D.   On: 06/01/2017 21:02   Ct Thoracic Spine Wo Contrast  Result Date: 06/09/2017 CLINICAL DATA:  Marked worsening in thoracic spine pain. The patient has a thoracic compression fracture seen on prior plain films the chest. The fracture is new since 06/01/2017. EXAM: CT THORACIC SPINE WITHOUT CONTRAST TECHNIQUE: Multidetector CT images of the thoracic were obtained using the standard protocol without intravenous contrast. COMPARISON:  PA and lateral chest 06/08/2017.  CT chest 06/01/2017. FINDINGS: Alignment: Maintained. Vertebrae: As seen on the comparison plain films, the patient has a compression fracture of T6 with vertebral body height loss of up to approximately 90%. There is little to no bony retropulsion and the fracture does not involve the posterior elements. No other fracture is identified. No focal bony lesion is seen. Paraspinal and other soft tissues: Extensive atherosclerotic vascular disease is present. Imaged lung parenchyma is clear with emphysematous change noted. Disc levels: T6-7: Shallow disc bulge without stenosis. T8-9:  Shallow central protrusion without stenosis. 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 L1-2: Ossification of the posterior longitudinal ligament narrows the ventral thecal sac. Except as described, intervertebral disc spaces are unremarkable. IMPRESSION: Acute compression fracture of T6 with vertebral body height loss of up to 90%. No bony retropulsion or involvement of the posterior elements. Mild thoracic spondylosis as described above. Emphysema. Atherosclerosis. Electronically Signed   By: Inge Rise M.D.   On: 06/09/2017 12:20   Ct Abdomen  Pelvis W Contrast  Result Date: 06/05/2017 CLINICAL DATA:  Bilateral back pain radiating to anterior abdomen with nausea and vomiting as well as diarrhea 4 days. EXAM: CT ABDOMEN AND PELVIS WITH CONTRAST TECHNIQUE: Multidetector CT imaging of the abdomen and pelvis was performed using the standard protocol following bolus administration of intravenous contrast. CONTRAST:  73mL ISOVUE-300 IOPAMIDOL (ISOVUE-300) INJECTION 61% COMPARISON:  03/04/2017 and 02/24/2017 FINDINGS: Lower  chest: 6 mm nodule over the medial right lower lobe unchanged. Scarring with mild nodularity over the posterolateral right lower lobe without significant change. Calcification of the right coronary artery and adjacent the mitral valve annulus. Mild prominence of the heart. Hepatobiliary: Nodular contour to the liver with stable hypodense nodules over the right lobe previously evaluated by a PET-CT and found to be non hypermetabolic. Gallbladder and biliary tree are normal. Pancreas: Normal. Spleen: Subcentimeter hypodensity over the inferior aspect of the spleen unchanged likely a cyst or hemangioma. Adrenals/Urinary Tract: Adrenal glands are normal. Kidneys are normal in size without hydronephrosis or nephrolithiasis. Ureters are within normal. Bladder is normal. Stomach/Bowel: Stomach and small bowel are normal. The appendix is normal. Colon is unremarkable. Vascular/Lymphatic: There is mild calcified plaque over the abdominal aorta and iliac arteries. No adenopathy. Reproductive: Within normal. Other: No free fluid or focal inflammatory change. Musculoskeletal: Mild degenerate change of the spine and hips. IMPRESSION: No acute findings in the abdomen/pelvis. Evidence of known cirrhosis with stable right lobe nodules. Scar in the right base. 6 mm nodule over the right lower lobe unchanged. Recommend followup CT 6 months. This recommendation follows the consensus statement: Guidelines for Management of Small Pulmonary Nodules Detected on  CT Scans: A Statement from the [employer] as published in Radiology 2005; 237:395-400. Online at: https://www.arnold.com/. Aortic Atherosclerosis (ICD10-I70.0). Electronically Signed   By: Marin Olp M.D.   On: 06/05/2017 18:21   Portable Chest X-ray 1 View  Result Date: 06/02/2017 CLINICAL DATA:  CHF. EXAM: PORTABLE CHEST 1 VIEW COMPARISON:  06/01/2017 chest radiograph and CTA FINDINGS: The cardiac silhouette is borderline enlarged. Aortic atherosclerosis is noted. There is central pulmonary artery enlargement as described on CT. Slight interstitial prominence is similar to the prior study without overt alveolar edema. No sizable pleural effusion or pneumothorax is seen. A sclerotic lesion is again partially visualized in the proximal left humerus, benign in appearance. IMPRESSION: Mild pulmonary vascular congestion without overt edema. Electronically Signed   By: Logan Bores M.D.   On: 06/02/2017 09:16   Dg Chest Port 1 View  Result Date: 06/01/2017 CLINICAL DATA:  Short of breath EXAM: PORTABLE CHEST 1 VIEW COMPARISON:  03/02/2013 FINDINGS: Cardiac enlargement with mild vascular congestion. Negative for edema or effusion. Mild bibasilar atelectasis. Sclerotic benign appearing lesion proximal left humerus unchanged IMPRESSION: Mild bibasilar atelectasis. Pulmonary vascular congestion Atherosclerotic aorta Electronically Signed   By: Franchot Gallo M.D.   On: 06/01/2017 18:39   Dg C-arm 1-60 Min  Result Date: 06/12/2017 CLINICAL DATA:  Thoracic spinal augmentation.  T6 fracture. EXAM: THORACIC SPINE 2 VIEWS; DG C-ARM 61-120 MIN COMPARISON:  Thoracic spine CT 06/09/2017. FLUOROSCOPY TIME:  1 minutes and 59 seconds C-arm fluoroscopic images were obtained intraoperatively and submitted for post operative interpretation. Please see the performing provider's procedural report for the fluoroscopy time utilized. FINDINGS: Four spot fluoroscopic images of the  thoracic spine demonstrate sequential midthoracic kyphoplasty. Numbering limited by small field-of-view. Final images demonstrate methylmethacrylate extending to the posterior margin of the vertebral body on the lateral view, with possible extension into the pedicle. No demonstrated complication. IMPRESSION: Intraoperative views during midthoracic (T6 based on prior CT) kyphoplasty. Electronically Signed   By: Richardean Sale M.D.   On: 06/12/2017 08:57     CBC Recent Labs  Lab 06/08/17 2127 06/09/17 0328 06/12/17 0438  WBC 8.1 5.8 7.0  HGB 13.0 12.2 12.8  HCT 38.6 35.4 38.1  PLT 277 245 292  MCV 90.9 90.0 92.2  MCH 30.7 31.2 30.9  MCHC 33.7 34.6 33.5  RDW 13.3 13.6 13.7    Chemistries  Recent Labs  Lab 06/08/17 2127  06/10/17 0430 06/10/17 1445 06/11/17 0410 06/11/17 1137 06/12/17 0438 06/13/17 0539  NA 127*   < > 129*  --  129* 131* 134* 132*  K 4.9   < > 5.4*  --  6.0* 5.4* 4.8 5.1  CL 92*   < > 98*  --  98* 100* 102 99*  CO2 27   < > 25  --  26 26 25 27   GLUCOSE 137*   < > 138*  --  146* 110* 121* 104*  BUN 39*   < > 46*  --  47* 48* 45* 48*  CREATININE 2.16*   < > 1.70*  --  1.51* 1.48* 1.29* 1.21*  CALCIUM 9.4   < > 8.8*  --  8.8* 9.1 9.4 9.1  MG  --   --   --  2.1  --   --   --   --   AST 40  --   --   --   --   --   --   --   ALT 19  --   --   --   --   --   --   --   ALKPHOS 45  --   --   --   --   --   --   --   BILITOT 0.9  --   --   --   --   --   --   --    < > = values in this interval not displayed.   ------------------------------------------------------------------------------------------------------------------ estimated creatinine clearance is 26.9 mL/min (A) (by C-G formula based on SCr of 1.21 mg/dL (H)). ------------------------------------------------------------------------------------------------------------------ No results for input(s): HGBA1C in the last 72  hours. ------------------------------------------------------------------------------------------------------------------ No results for input(s): CHOL, HDL, LDLCALC, TRIG, CHOLHDL, LDLDIRECT in the last 72 hours. ------------------------------------------------------------------------------------------------------------------ No results for input(s): TSH, T4TOTAL, T3FREE, THYROIDAB in the last 72 hours.  Invalid input(s): FREET3 ------------------------------------------------------------------------------------------------------------------ No results for input(s): VITAMINB12, FOLATE, FERRITIN, TIBC, IRON, RETICCTPCT in the last 72 hours.  Coagulation profile No results for input(s): INR, PROTIME in the last 168 hours.  No results for input(s): DDIMER in the last 72 hours.  Cardiac Enzymes Recent Labs  Lab 06/09/17 0328 06/09/17 0924 06/09/17 1518  TROPONINI 0.08* 0.06* 0.06*   ------------------------------------------------------------------------------------------------------------------ Invalid input(s): POCBNP    Assessment & Plan   83 year old female with a known history of chronic systolic CHF, last EF of 59-56%, had an NSTEMI a week ago now is being admitted for new compression fracture of mid thoracic vertebral body and severe hyponatremia  *t6 compression fracture: Post kyphoplasty, physical therapy recommended rehab.  Pending placement, family declined the offer from [facility], appreciate workers help.  Continue pain medicines, physical therapy.  Slight pain in the upper back is expected, continue pain medicine, physical therapy.   *Severe hyponatremia -Likely from overdiuresis Improved, restart Lasix today.  *Acute on chronic kidney disease stage III; improved, -Likely due to overdiuresis -improved with ivf   Chronic systolic heart failure: --EF of 30-35% -Well compensated at this time On telemetry, continue Lasix.  Mild hyperkalemia: Restart Lasix  today.  And monitor on telemetry.    Code Status Orders  (From admission, onward)        Start     Ordered   06/09/17 0008  Full code  Continuous  06/09/17 0008    Code Status History    Date Active Date Inactive Code Status Order ID Comments User Context   06/01/2017 23:10 06/04/2017 18:03 DNR [id number]  Gorden Harms, MD Inpatient   02/25/2017 00:00 02/26/2017 02:45 Full Code [id number]  Lance Coon, MD ED   02/18/2017 16:20 02/20/2017 18:54 Full Code [id number]  Hillary Bow, MD ED    Advance Directive Documentation     Most Recent Value  Type of Advance Directive  Living will  Pre-existing out of facility DNR order (yellow form or pink MOST form)  No data  "MOST" Form in Place?  No data           Consults orthopedics  DVT Prophylaxis discontinue heparin for possible procedure  Lab Results  Component Value Date   PLT 292 06/12/2017     Time Spent in minutes 35 minutes greater than 50% of time spent in care coordination and counseling patient regarding the condition and plan of care.   Epifanio Lesches M.D on 06/13/2017 at 12:47 PM  Between 7am to 6pm - Pager - [phone]  After 6pm go to [email or web address] - password [email or web address]   Office  [phone]

## 2017-06-13 NOTE — Progress Notes (Signed)
Pt. Walked to end of nursing station by room 36 and back with no difficulties.

## 2017-06-14 LAB — BASIC METABOLIC PANEL
ANION GAP: 6 (ref 5–15)
BUN: 46 mg/dL — ABNORMAL HIGH (ref 6–20)
CALCIUM: 9 mg/dL (ref 8.9–10.3)
CO2: 27 mmol/L (ref 22–32)
CREATININE: 1.12 mg/dL — AB (ref 0.44–1.00)
Chloride: 98 mmol/L — ABNORMAL LOW (ref 101–111)
GFR, EST AFRICAN AMERICAN: 52 mL/min — AB (ref >60)
GFR, EST NON AFRICAN AMERICAN: 44 mL/min — AB (ref >60)
Glucose, Bld: 124 mg/dL — ABNORMAL HIGH (ref 65–99)
Potassium: 5 mmol/L (ref 3.5–5.1)
SODIUM: 131 mmol/L — AB (ref 135–145)

## 2017-06-14 LAB — GLUCOSE, CAPILLARY: Glucose-Capillary: 119 mg/dL — ABNORMAL HIGH (ref 65–99)

## 2017-06-14 NOTE — Plan of Care (Signed)
  Progressing Education: Knowledge of General Education information will improve 06/14/2017 1512 - Progressing by Rolley Sims, RN Activity: Risk for activity intolerance will decrease 06/14/2017 1512 - Progressing by Rolley Sims, RN Note Rounded with MD, pt resting in room comfortably, pt ambulated from room to 259 without difficulties, tolerated well. Will continue to monitor. Safety: Ability to remain free from injury will improve 06/14/2017 1512 - Progressing by Rolley Sims, RN

## 2017-06-14 NOTE — Progress Notes (Signed)
PT Cancellation Note  Patient Details Name: Jacqueline [name] MRN: [id number] DOB: [birth year]   Cancelled Treatment:    Reason Eval/Treat Not Completed: Other (comment).  Declined as she had just walked with nursing and gotten to bed and refused bed ex as well.  Will try to see in the AM.   Ramond Dial 06/14/2017, 2:42 PM   Mee Hives, PT MS Acute Rehab Dept. Number: [place] and [place]

## 2017-06-14 NOTE — Progress Notes (Signed)
[place] at [facility]                                                                                                                                                                                  Patient Demographics   Jacqueline [name], is a 83 y.o. female, DOB - 1932-07-16, GEX:[id number]  Admit date - 06/08/2017   Admitting Physician Max Sane, MD  Outpatient Primary MD for the patient is Tracie Harrier, MD   LOS - 5  Subjective: Patient walked to the nurses station yesterday 2 times.  Family interested in [place] otherwise wants to go home with home therapy  Review of Systems:   CONSTITUTIONAL: No documented fever. No fatigue, weakness. No weight gain, no weight loss.  EYES: No blurry or double vision.  ENT: No tinnitus. No postnasal drip. No redness of the oropharynx.  RESPIRATORY: No cough, no wheeze, no hemoptysis. No dyspnea.  CARDIOVASCULAR: No chest pain. No orthopnea. No palpitations. No syncope.  GASTROINTESTINAL: No nausea, no vomiting or diarrhea. No abdominal pain. No melena or hematochezia.  GENITOURINARY: No dysuria or hematuria.  ENDOCRINE: No polyuria or nocturia. No heat or cold intolerance.  HEMATOLOGY: No anemia. No bruising. No bleeding.  INTEGUMENTARY: No rashes. No lesions.  MUSCULOSKELETAL: Positive back pain NEUROLOGIC: No numbness, tingling, or ataxia. No seizure-type activity.  PSYCHIATRIC: No anxiety. No insomnia. No ADD.    Vitals:   Vitals:   06/13/17 2055 06/14/17 0328 06/14/17 0741 06/14/17 0745  BP: (!) 117/45 (!) 115/47 (!) 145/51 (!) 145/51  Pulse: 73 66 66 66  Resp: 16 16  18   Temp: 97.8 F (36.6 C) (!) 97.4 F (36.3 C)    TempSrc: Oral Oral    SpO2: 100% 98%  97%  Weight:  58.5 kg (129 lb)    Height:        Wt Readings from Last 3 Encounters:  06/14/17 58.5 kg (129 lb)  06/05/17 58.1 kg (128 lb)  06/04/17 58.1 kg (128 lb)     Intake/Output Summary (Last 24 hours) at 06/14/2017 1248 Last data  filed at 06/14/2017 1038 Gross per 24 hour  Intake 480 ml  Output 800 ml  Net -320 ml    Physical Exam:   GENERAL: Pleasant-appearing in no apparent distress.  HEAD, EYES, EARS, NOSE AND THROAT: Atraumatic, normocephalic. Extraocular muscles are intact. Pupils equal and reactive to light. Sclerae anicteric. No conjunctival injection. No oro-pharyngeal erythema.  NECK: Supple. There is no jugular venous distention. No bruits, no lymphadenopathy, no thyromegaly.  HEART: Regular rate and rhythm,. No murmurs, no rubs, no clicks.  LUNGS: Clear to auscultation bilaterally. No rales or rhonchi. No wheezes.  ABDOMEN: Soft, flat, nontender, nondistended. Has good bowel sounds. No hepatosplenomegaly appreciated.  EXTREMITIES: No evidence of any cyanosis, clubbing, or peripheral edema.  +2 pedal and radial pulses bilaterally.  NEUROLOGIC: The patient is alert, awake, and oriented x3 with no focal motor or sensory deficits appreciated bilaterally.  SKIN: Moist and warm with no rashes appreciated.  Psych: Not anxious, depressed LN: No inguinal LN enlargement    Antibiotics   Anti-infectives (From admission, onward)   Start     Dose/Rate Route Frequency Ordered Stop   06/12/17 0800  clindamycin (CLEOCIN) IVPB 600 mg     600 mg 100 mL/hr over 30 Minutes Intravenous  Once 06/11/17 1533 06/12/17 0808      Medications   Scheduled Meds: . atorvastatin  80 mg Oral q1800  . carvedilol  12.5 mg Oral BID WC  . docusate sodium  100 mg Oral BID  . enoxaparin (LOVENOX) injection  30 mg Subcutaneous Q24H  . ergocalciferol  50,000 Units Oral Weekly  . furosemide  20 mg Oral Daily  . isosorbide mononitrate  30 mg Oral Daily  . levothyroxine  25 mcg Oral QAC breakfast  . meclizine  12.5 mg Oral BID  . mouth rinse  15 mL Mouth Rinse BID  . multivitamin with minerals  1 tablet Oral Daily  . pantoprazole  40 mg Oral Daily  . sertraline  50 mg Oral Daily  . sodium chloride flush  3 mL Intravenous Q12H    Continuous Infusions: . sodium chloride Stopped (06/11/17 1204)   PRN Meds:.acetaminophen **OR** acetaminophen, bisacodyl, HYDROcodone-acetaminophen, ondansetron **OR** ondansetron (ZOFRAN) IV, traZODone   Data Review:   Micro Results Recent Results (from the past 240 hour(s))  Urine culture     Status: None   Collection Time: 06/08/17  9:59 PM  Result Value Ref Range Status   Specimen Description   Final    URINE, RANDOM Performed at [facility], [address] [place], [place] [address]    Special Requests   Final    NONE Performed at [facility], [address] [place], [place] [address]    Culture   Final    NO GROWTH Performed at [facility], [place] [address] [place], [place] [address]    Report Status 06/10/2017 FINAL  Final    Radiology Reports Dg Chest 2 View  Result Date: 06/08/2017 CLINICAL DATA:  Back pain radiating to the ribs EXAM: CHEST  2 VIEW COMPARISON:  Chest radiograph 06/02/2017 FINDINGS: There is unchanged cardiomegaly and calcific aortic atherosclerosis. Pulmonary vascular congestion is unchanged. There is an enchondroma within the proximal left humerus. No focal consolidation, pleural effusion or pneumothorax. There is a compression fracture of a midthoracic vertebral body that is new compared to 06/01/2017. IMPRESSION: 1. New compression fracture of a midthoracic vertebral body since 06/01/2017, in keeping with reported midthoracic back pain. 2. Unchanged pulmonary vascular congestion with cardiomegaly and calcific aortic atherosclerosis (ICD10-I70.0). No overt pulmonary edema. Electronically Signed   By: Ulyses Jarred M.D.   On: 06/08/2017 22:07   Dg Thoracic Spine 2 View  Result Date: 06/12/2017 CLINICAL DATA:  Thoracic spinal augmentation.  T6 fracture. EXAM: THORACIC SPINE 2 VIEWS; DG C-ARM 61-120 MIN COMPARISON:  Thoracic spine CT 06/09/2017. FLUOROSCOPY TIME:  1 minutes and 59 seconds C-arm fluoroscopic images were  obtained intraoperatively and submitted for post operative interpretation. Please see the performing provider's procedural report for the fluoroscopy time utilized. FINDINGS: Four spot fluoroscopic images of the thoracic spine  demonstrate sequential midthoracic kyphoplasty. Numbering limited by small field-of-view. Final images demonstrate methylmethacrylate extending to the posterior margin of the vertebral body on the lateral view, with possible extension into the pedicle. No demonstrated complication. IMPRESSION: Intraoperative views during midthoracic (T6 based on prior CT) kyphoplasty. Electronically Signed   By: Richardean Sale M.D.   On: 06/12/2017 08:57   Ct Angio Chest Pe W And/or Wo Contrast  Result Date: 06/01/2017 CLINICAL DATA:  Short of breath EXAM: CT ANGIOGRAPHY CHEST WITH CONTRAST TECHNIQUE: Multidetector CT imaging of the chest was performed using the standard protocol during bolus administration of intravenous contrast. Multiplanar CT image reconstructions and MIPs were obtained to evaluate the vascular anatomy. CONTRAST:  22mL ISOVUE-370 IOPAMIDOL (ISOVUE-370) INJECTION 76% COMPARISON:  Chest 06/01/2017 FINDINGS: Cardiovascular: Negative for pulmonary embolism. Pulmonary artery enlargement bilaterally compatible with pulmonary artery hypertension Extensive atherosclerotic calcification aortic arch and coronary artery. Heart size within normal limits. No pericardial effusion Mediastinum/Nodes: Negative for mass or adenopathy Lungs/Pleura: Scarring right medial upper lobe. Chronic lung disease. Negative for infiltrate or effusion. Partially calcified nodule right lung base unchanged from prior PET-CT 03/13/2017 Upper Abdomen: Cirrhosis of the liver. No ascites. Spleen not enlarged Musculoskeletal: Negative Review of the MIP images confirms the above findings. IMPRESSION: Negative for pulmonary embolism.  Pulmonary artery hypertension Diffuse atherosclerotic disease including the coronary  artery. Cirrhosis Aortic Atherosclerosis (ICD10-I70.0). Electronically Signed   By: Franchot Gallo M.D.   On: 06/01/2017 21:02   Ct Thoracic Spine Wo Contrast  Result Date: 06/09/2017 CLINICAL DATA:  Marked worsening in thoracic spine pain. The patient has a thoracic compression fracture seen on prior plain films the chest. The fracture is new since 06/01/2017. EXAM: CT THORACIC SPINE WITHOUT CONTRAST TECHNIQUE: Multidetector CT images of the thoracic were obtained using the standard protocol without intravenous contrast. COMPARISON:  PA and lateral chest 06/08/2017.  CT chest 06/01/2017. FINDINGS: Alignment: Maintained. Vertebrae: As seen on the comparison plain films, the patient has a compression fracture of T6 with vertebral body height loss of up to approximately 90%. There is little to no bony retropulsion and the fracture does not involve the posterior elements. No other fracture is identified. No focal bony lesion is seen. Paraspinal and other soft tissues: Extensive atherosclerotic vascular disease is present. Imaged lung parenchyma is clear with emphysematous change noted. Disc levels: T6-7: Shallow disc bulge without stenosis. T8-9:  Shallow central protrusion without stenosis. 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 L1-2: Ossification of the posterior longitudinal ligament narrows the ventral thecal sac. Except as described, intervertebral disc spaces are unremarkable. IMPRESSION: Acute compression fracture of T6 with vertebral body height loss of up to 90%. No bony retropulsion or involvement of the posterior elements. Mild thoracic spondylosis as described above. Emphysema. Atherosclerosis. Electronically Signed   By: Inge Rise M.D.   On: 06/09/2017 12:20   Ct Abdomen Pelvis W Contrast  Result Date: 06/05/2017 CLINICAL DATA:  Bilateral back pain radiating to anterior abdomen with nausea and vomiting as well as diarrhea 4  days. EXAM: CT ABDOMEN AND PELVIS WITH CONTRAST TECHNIQUE: Multidetector CT imaging of the abdomen and pelvis was performed using the standard protocol following bolus administration of intravenous contrast. CONTRAST:  85mL ISOVUE-300 IOPAMIDOL (ISOVUE-300) INJECTION 61% COMPARISON:  03/04/2017 and 02/24/2017 FINDINGS: Lower chest: 6 mm nodule over the medial right lower lobe unchanged. Scarring with mild nodularity over the posterolateral right lower lobe without significant change. Calcification of the right coronary artery and adjacent  the mitral valve annulus. Mild prominence of the heart. Hepatobiliary: Nodular contour to the liver with stable hypodense nodules over the right lobe previously evaluated by a PET-CT and found to be non hypermetabolic. Gallbladder and biliary tree are normal. Pancreas: Normal. Spleen: Subcentimeter hypodensity over the inferior aspect of the spleen unchanged likely a cyst or hemangioma. Adrenals/Urinary Tract: Adrenal glands are normal. Kidneys are normal in size without hydronephrosis or nephrolithiasis. Ureters are within normal. Bladder is normal. Stomach/Bowel: Stomach and small bowel are normal. The appendix is normal. Colon is unremarkable. Vascular/Lymphatic: There is mild calcified plaque over the abdominal aorta and iliac arteries. No adenopathy. Reproductive: Within normal. Other: No free fluid or focal inflammatory change. Musculoskeletal: Mild degenerate change of the spine and hips. IMPRESSION: No acute findings in the abdomen/pelvis. Evidence of known cirrhosis with stable right lobe nodules. Scar in the right base. 6 mm nodule over the right lower lobe unchanged. Recommend followup CT 6 months. This recommendation follows the consensus statement: Guidelines for Management of Small Pulmonary Nodules Detected on CT Scans: A Statement from the [employer] as published in Radiology 2005; 237:395-400. Online at:  [email or web address].[email or web address].com/. Aortic Atherosclerosis (ICD10-I70.0). Electronically Signed   By: Marin Olp M.D.   On: 06/05/2017 18:21   Portable Chest X-ray 1 View  Result Date: 06/02/2017 CLINICAL DATA:  CHF. EXAM: PORTABLE CHEST 1 VIEW COMPARISON:  06/01/2017 chest radiograph and CTA FINDINGS: The cardiac silhouette is borderline enlarged. Aortic atherosclerosis is noted. There is central pulmonary artery enlargement as described on CT. Slight interstitial prominence is similar to the prior study without overt alveolar edema. No sizable pleural effusion or pneumothorax is seen. A sclerotic lesion is again partially visualized in the proximal left humerus, benign in appearance. IMPRESSION: Mild pulmonary vascular congestion without overt edema. Electronically Signed   By: Logan Bores M.D.   On: 06/02/2017 09:16   Dg Chest Port 1 View  Result Date: 06/01/2017 CLINICAL DATA:  Short of breath EXAM: PORTABLE CHEST 1 VIEW COMPARISON:  03/02/2013 FINDINGS: Cardiac enlargement with mild vascular congestion. Negative for edema or effusion. Mild bibasilar atelectasis. Sclerotic benign appearing lesion proximal left humerus unchanged IMPRESSION: Mild bibasilar atelectasis. Pulmonary vascular congestion Atherosclerotic aorta Electronically Signed   By: Franchot Gallo M.D.   On: 06/01/2017 18:39   Dg C-arm 1-60 Min  Result Date: 06/12/2017 CLINICAL DATA:  Thoracic spinal augmentation.  T6 fracture. EXAM: THORACIC SPINE 2 VIEWS; DG C-ARM 61-120 MIN COMPARISON:  Thoracic spine CT 06/09/2017. FLUOROSCOPY TIME:  1 minutes and 59 seconds C-arm fluoroscopic images were obtained intraoperatively and submitted for post operative interpretation. Please see the performing provider's procedural report for the fluoroscopy time utilized. FINDINGS: Four spot fluoroscopic images of the thoracic spine demonstrate sequential midthoracic kyphoplasty. Numbering limited by small field-of-view. Final  images demonstrate methylmethacrylate extending to the posterior margin of the vertebral body on the lateral view, with possible extension into the pedicle. No demonstrated complication. IMPRESSION: Intraoperative views during midthoracic (T6 based on prior CT) kyphoplasty. Electronically Signed   By: Richardean Sale M.D.   On: 06/12/2017 08:57     CBC Recent Labs  Lab 06/08/17 2127 06/09/17 0328 06/12/17 0438  WBC 8.1 5.8 7.0  HGB 13.0 12.2 12.8  HCT 38.6 35.4 38.1  PLT 277 245 292  MCV 90.9 90.0 92.2  MCH 30.7 31.2 30.9  MCHC 33.7 34.6 33.5  RDW 13.3 13.6 13.7    Chemistries  Recent Labs  Lab 06/08/17 2127  06/10/17 1445 06/11/17 0410 06/11/17 1137  06/12/17 0438 06/13/17 0539 06/14/17 0647  NA 127*   < >  --  129* 131* 134* 132* 131*  K 4.9   < >  --  6.0* 5.4* 4.8 5.1 5.0  CL 92*   < >  --  98* 100* 102 99* 98*  CO2 27   < >  --  26 26 25 27 27   GLUCOSE 137*   < >  --  146* 110* 121* 104* 124*  BUN 39*   < >  --  47* 48* 45* 48* 46*  CREATININE 2.16*   < >  --  1.51* 1.48* 1.29* 1.21* 1.12*  CALCIUM 9.4   < >  --  8.8* 9.1 9.4 9.1 9.0  MG  --   --  2.1  --   --   --   --   --   AST 40  --   --   --   --   --   --   --   ALT 19  --   --   --   --   --   --   --   ALKPHOS 45  --   --   --   --   --   --   --   BILITOT 0.9  --   --   --   --   --   --   --    < > = values in this interval not displayed.   ------------------------------------------------------------------------------------------------------------------ estimated creatinine clearance is 29.3 mL/min (A) (by C-G formula based on SCr of 1.12 mg/dL (H)). ------------------------------------------------------------------------------------------------------------------ No results for input(s): HGBA1C in the last 72 hours. ------------------------------------------------------------------------------------------------------------------ No results for input(s): CHOL, HDL, LDLCALC, TRIG, CHOLHDL, LDLDIRECT in  the last 72 hours. ------------------------------------------------------------------------------------------------------------------ No results for input(s): TSH, T4TOTAL, T3FREE, THYROIDAB in the last 72 hours.  Invalid input(s): FREET3 ------------------------------------------------------------------------------------------------------------------ No results for input(s): VITAMINB12, FOLATE, FERRITIN, TIBC, IRON, RETICCTPCT in the last 72 hours.  Coagulation profile No results for input(s): INR, PROTIME in the last 168 hours.  No results for input(s): DDIMER in the last 72 hours.  Cardiac Enzymes Recent Labs  Lab 06/09/17 0328 06/09/17 0924 06/09/17 1518  TROPONINI 0.08* 0.06* 0.06*   ------------------------------------------------------------------------------------------------------------------ Invalid input(s): POCBNP    Assessment & Plan   83 year old female with a known history of chronic systolic CHF, last EF of 43-15%, had an NSTEMI a week ago now is being admitted for new compression fracture of mid thoracic vertebral body and severe hyponatremia  *t6 compression fracture: Post kyphoplasty, physical therapy recommended rehab.  Pending placement, family declined the offer from [facility], appreciate workers help.  Continue pain medicines, physical therapy.  Slight pain in the upper back is expected, continue pain medicine, physical therapy.   *Severe hyponatremia -Likely from overdiuresis Improved, restarted the Lasix.   *Acute on chronic kidney disease stage III; improved, -Likely due to overdiuresis -improved with ivf   Chronic systolic heart failure: --EF of 30-35% -Well compensated at this time On telemetry, continue Lasix.  Stable for discharge when arrangements are made, likely tomorrow.  Mild hyperkalemia: RestartedLasix today.  And monitor on telemetry.    Code Status Orders  (From admission, onward)        Start     Ordered    06/09/17 0008  Full code  Continuous     06/09/17 0008    Code Status History    Date Active Date Inactive Code Status Order ID Comments User Context  06/01/2017 23:10 06/04/2017 18:03 DNR [id number]  Gorden Harms, MD Inpatient   02/25/2017 00:00 02/26/2017 02:45 Full Code [id number]  Lance Coon, MD ED   02/18/2017 16:20 02/20/2017 18:54 Full Code [id number]  Hillary Bow, MD ED    Advance Directive Documentation     Most Recent Value  Type of Advance Directive  Living will  Pre-existing out of facility DNR order (yellow form or pink MOST form)  No data  "MOST" Form in Place?  No data           Consults orthopedics  DVT Prophylaxis discontinue heparin for possible procedure  Lab Results  Component Value Date   PLT 292 06/12/2017     Time Spent in minutes 35 minutes greater than 50% of time spent in care coordination and counseling patient regarding the condition and plan of care.   Epifanio Lesches M.D on 06/14/2017 at 12:48 PM  Between 7am to 6pm - Pager - [phone]  After 6pm go to [email or web address] - password [email or web address] [facility]   Office  [phone]

## 2017-06-15 ENCOUNTER — Ambulatory Visit: Payer: Medicare Other | Admitting: Family

## 2017-06-15 LAB — BASIC METABOLIC PANEL
Anion gap: 6 (ref 5–15)
BUN: 43 mg/dL — AB (ref 6–20)
CALCIUM: 8.9 mg/dL (ref 8.9–10.3)
CHLORIDE: 98 mmol/L — AB (ref 101–111)
CO2: 26 mmol/L (ref 22–32)
CREATININE: 1.18 mg/dL — AB (ref 0.44–1.00)
GFR calc non Af Amer: 42 mL/min — ABNORMAL LOW (ref >60)
GFR, EST AFRICAN AMERICAN: 48 mL/min — AB (ref >60)
Glucose, Bld: 105 mg/dL — ABNORMAL HIGH (ref 65–99)
Potassium: 4.9 mmol/L (ref 3.5–5.1)
SODIUM: 130 mmol/L — AB (ref 135–145)

## 2017-06-15 LAB — GLUCOSE, CAPILLARY: GLUCOSE-CAPILLARY: 102 mg/dL — AB (ref 65–99)

## 2017-06-15 LAB — SURGICAL PATHOLOGY

## 2017-06-15 MED ORDER — DOCUSATE SODIUM 100 MG PO CAPS: 100.0000 mg | ORAL_CAPSULE | Freq: Two times a day (BID) | ORAL | 0 refills | Status: DC

## 2017-06-15 MED ORDER — HYDROCODONE-ACETAMINOPHEN 5-325 MG PO TABS: 1.0000 | ORAL_TABLET | ORAL | 0 refills | Status: DC | PRN

## 2017-06-15 MED ORDER — TRAMADOL HCL 50 MG PO TABS: 50.0000 mg | ORAL_TABLET | Freq: Two times a day (BID) | ORAL | 0 refills | Status: DC | PRN

## 2017-06-15 MED ORDER — FUROSEMIDE 20 MG PO TABS: 20.0000 mg | ORAL_TABLET | Freq: Every day | ORAL | 0 refills | Status: DC

## 2017-06-15 NOTE — Progress Notes (Signed)
Pt discharged to [facility] report given to [facility], pt daughter updated. VSS, IV removed, AVS and prescriptions in packet.

## 2017-06-15 NOTE — Care Management (Signed)
CM informed that patient's family declined offer for [facility]. There is a note from [facility] that weekend CM was updated and needed to speak with family about home health. This CM had already set up home health as an alternate plan. There is no documentation that weekend CM spoke with family.  Discussed with attending and CSW that patient is medically stable and should discharge today either to a skilled facility or home with home health

## 2017-06-15 NOTE — Progress Notes (Addendum)
Dr. Marcille Blanco aware of Na @ 130, no new orders at this time.

## 2017-06-15 NOTE — Progress Notes (Signed)
Physical Therapy Treatment Patient Details Name: Jacqueline [name] MRN: [id number] DOB: August 29, 1932 Today's Date: 06/15/2017    History of Present Illness Pt is a 83 y/o F who presented with upper back pain.  Pt had an NSTEMI ~1 wk PTA.  Pt is now on 2L O2 at all times.  She is being treated for UTI.  Chest x-ray showed new compression fx T6.  Pt now s/p T6 kyphoplasty.  Pt's PMH includes CHF.    PT Comments    Pt initially refuses PT, but with encouragement to walk, pt agreeable. Notes mild pain in back.  Pt progressing all mobility; pt tends to reach for assist for bed mobility and transfers, but able to demonstrate with only Min guard/supervision. Pt progressing ambulation distance; quality may be baseline, but could be improved with HHPT. Discharge plan changed to HHPT with 24 hour supervision. Continue PT to progress independence with bed mobility and transfers and quality of ambulation to allow for a safe return home.   Follow Up Recommendations  Home health PT;Supervision/Assistance - 24 hour     Equipment Recommendations       Recommendations for Other Services       Precautions / Restrictions Precautions Precautions: Fall Restrictions Weight Bearing Restrictions: No    Mobility  Bed Mobility Overal bed mobility: Needs Assistance Bed Mobility: Supine to Sit Rolling: Min guard Sidelying to sit: Min guard       General bed mobility comments: Pt reaches for assist, but with cues and encouragement, pt able to roll to side for sit to edge of bed  Transfers Overall transfer level: Needs assistance Equipment used: Rolling walker (2 wheeled) Transfers: Sit to/from Stand Sit to Stand: Min guard         General transfer comment: Requires cues for hand placement  Ambulation/Gait Ambulation/Gait assistance: Min guard Ambulation Distance (Feet): 120 Feet Assistive device: Rolling walker (2 wheeled) Gait Pattern/deviations: Step-through pattern;Decreased stride  length;Trunk flexed Gait velocity: decreased Gait velocity interpretation: <1.8 ft/sec, indicative of risk for recurrent falls(1.05 ft/sec) General Gait Details: slow, guarded, but no LOB, manages turns well   Science writer    Modified Rankin (Stroke Patients Only)       Balance Overall balance assessment: Needs assistance Sitting-balance support: Feet supported;Bilateral upper extremity supported       Standing balance support: Bilateral upper extremity supported Standing balance-Leahy Scale: Fair                              Cognition Arousal/Alertness: Awake/alert Behavior During Therapy: WFL for tasks assessed/performed Overall Cognitive Status: Within Functional Limits for tasks assessed                                        Exercises      General Comments        Pertinent Vitals/Pain Pain Assessment: Faces Faces Pain Scale: Hurts little more Pain Location: Back Pain Intervention(s): Monitored during session    Home Living                      Prior Function            PT Goals (current goals can now be found in the care plan section) Progress towards PT goals: Progressing toward  goals    Frequency    7X/week      PT Plan Discharge plan needs to be updated    Co-evaluation              AM-PAC PT "6 Clicks" Daily Activity  Outcome Measure  Difficulty turning over in bed (including adjusting bedclothes, sheets and blankets)?: A Little Difficulty moving from lying on back to sitting on the side of the bed? : A Little Difficulty sitting down on and standing up from a chair with arms (e.g., wheelchair, bedside commode, etc,.)?: A Little Help needed moving to and from a bed to chair (including a wheelchair)?: A Little Help needed walking in hospital room?: A Little Help needed climbing 3-5 steps with a railing? : A Little 6 Click Score: 18    End of Session Equipment  Utilized During Treatment: Gait belt;Oxygen Activity Tolerance: Patient tolerated treatment well Patient left: in chair;with call bell/phone within reach;with chair alarm set   PT Visit Diagnosis: Muscle weakness (generalized) (M62.81);Unsteadiness on feet (R26.81);History of falling (Z91.81);Other abnormalities of gait and mobility (R26.89)     Time: 1130-1157 PT Time Calculation (min) (ACUTE ONLY): 27 min  Charges:  $Gait Training: 8-22 mins $Therapeutic Activity: 8-22 mins                    G CodesLarae Grooms, PTA 06/15/2017, 12:56 PM

## 2017-06-15 NOTE — Care Management Important Message (Signed)
Important Message  Patient Details  Name: Jacqueline [name] MRN: [id number] Date of Birth: Aug 08, 1932   Medicare Important Message Given:  Yes    Katrina Stack, RN 06/15/2017, 9:27 AM

## 2017-06-15 NOTE — Clinical Social Work Note (Signed)
CSW spoke to patient's [name] and presented bed offers.  She chose [facility].  CSW contacted [facility], and they can accept patient today for short term rehab.  Patient to be d/c'ed today to [facility] [place] room E9.  Patient and family agreeable to plans will transport via ems RN to call report to [phone].  Evette Cristal, MSW, [place]

## 2017-06-15 NOTE — Discharge Summary (Addendum)
Jacqueline [name], is a 83 y.o. female  DOB 08/22/1932  MRN [id number].  Admission date:  06/08/2017  Admitting Physician  Max Sane, MD  Discharge Date:  06/15/2017   Primary MD  Tracie Harrier, MD  Recommendations for primary care physician for things to follow:   Follow up with PCP in one week.   Admission Diagnosis  AKI (acute kidney injury) ([place]) [W09.8] Chronic systolic CHF (congestive heart failure) ([place]) [I50.22] Compression fracture of body of thoracic vertebra ([place]) [S22.000A]   Discharge Diagnosis  AKI (acute kidney injury) ([place]) [J19.1] Chronic systolic CHF (congestive heart failure) ([place]) [I50.22] Compression fracture of body of thoracic vertebra ([place]) [S22.000A]    Active Problems:   Hyponatremia      Past Medical History:  Diagnosis Date  . CHF (congestive heart failure) ([place])   . Diabetes mellitus without complication ([place]) 47/82/9562   Currently no high BS.  Has come off meds.  But having episodes of low BS now.  . Dilated idiopathic cardiomyopathy ([place])   . GERD (gastroesophageal reflux disease)   . Hyperlipidemia   . Hypertension   . Hypothyroidism   . Renal disorder     Past Surgical History:  Procedure Laterality Date  . BUNIONECTOMY    . COLONOSCOPY    . KYPHOPLASTY N/A 06/12/2017   Procedure: [id number];  Surgeon: Hessie Knows, MD;  Location: [facility] ORS;  Service: Orthopedics;  Laterality: N/A;  . TONSILLECTOMY         History of present illness and  Hospital Course:     Kindly see H&P for history of present illness and admission details, please review complete Labs, Consult reports and Test reports for all details in brief  HPI  from the history and physical done on the day of admission 83 year old female patient with history of chronic systolic heart failure with EF  30-35%, came in because of severe upper back pain, found to have T6 vertebral compression fracture.  Admitted to telemetry because of her heart failure history.   Hospital Course  #1 T6 compression fracture status post kyphoplasty by orthopedic Dr. Rudene Christians, initially she had lots of pain but after surgery patient pain is decreased a lot and she is participating in physical therapy, physical therapy requested nursing home placement .  Patient is going to [facility]	[place] home today. 2.  Severe hyponatremia secondary to overdiuresis, patient sodium is 124 on admission we held diuretics, sodium improved to 32. 3.  Acute on chronic renal failure, chronic kidney disease stage III: Due to overdiuresis: Received gentle hydration, kidney function improved, creatinine from 2.16 on admission to 0.12. 4.   MILD Hyperkalemia improved after Kayexalate, restarted the Lasix 2 days ago.  Potassium improved from 5.1-4.9.  5.  Chronic systolic heart failure, EF 30-35%, well compensated at this time, monitored on telemetry, continue Lasix, ACE inhibitors, beta-blockers.    Discharge Condition: STABLE   Follow UP      Discharge Instructions  and  Discharge Medications     Discharge Instructions    Amb Referral to HF Clinic   Complete by:  As directed    Amb Referral to Phase II Cardiac  Rehab   Complete by:  As directed    Diagnosis:  Heart Failure (see criteria below if ordering Phase II)   Heart Failure Type:  Chronic Systolic     Allergies as of 06/15/2017      Reactions   Ciprofloxacin    Digoxin And Related    Erythromycin  Lopid [gemfibrozil]    Nitrofurantoin    Cefuroxime Rash   Penicillins Rash   Has patient had a PCN reaction causing immediate rash, facial/tongue/throat swelling, SOB or lightheadedness with hypotension: Unknown Has patient had a PCN reaction causing severe rash involving mucus membranes or skin necrosis: No Has patient had a PCN reaction that required  hospitalization: No Has patient had a PCN reaction occurring within the last 10 years: No If all of the above answers are "NO", then may proceed with Cephalosporin use.      Medication List    STOP taking these medications   potassium chloride 10 MEQ tablet Commonly known as:  K-DUR     TAKE these medications   aspirin EC 81 MG tablet Take 81 mg by mouth daily.   atorvastatin 40 MG tablet Commonly known as:  LIPITOR Take 2 tablets (80 mg total) by mouth daily at 6 PM.   carvedilol 12.5 MG tablet Commonly known as:  COREG Take 12.5 mg by mouth 2 (two) times daily with a meal.   docusate sodium 100 MG capsule Commonly known as:  COLACE Take 1 capsule (100 mg total) by mouth 2 (two) times daily.   ergocalciferol 50000 units capsule Commonly known as:  VITAMIN D2 Take 50,000 Units by mouth once a week.   fenofibrate 145 MG tablet Commonly known as:  TRICOR Take 145 mg by mouth daily.   FREESTYLE LITE test strip Generic drug:  glucose blood USE TO CHECK BLOOD SUGARS TWICE A DAY   furosemide 20 MG tablet Commonly known as:  LASIX Take 1 tablet (20 mg total) by mouth daily. Start taking on:  06/16/2017 What changed:    medication strength  how much to take  when to take this  additional instructions   FUSION PLUS Caps Take 1 capsule by mouth daily.   HYDROcodone-acetaminophen 5-325 MG tablet Commonly known as:  NORCO/VICODIN Take 1-2 tablets by mouth every 4 (four) hours as needed for moderate pain.   isosorbide mononitrate 60 MG 24 hr tablet Commonly known as:  IMDUR Take 0.5 tablets (30 mg total) by mouth daily.   levothyroxine 25 MCG tablet Commonly known as:  SYNTHROID, LEVOTHROID Take 25 mcg by mouth daily before breakfast.   meclizine 25 MG tablet Commonly known as:  ANTIVERT Take 12.5 mg by mouth 2 (two) times daily.   pantoprazole 40 MG tablet Commonly known as:  PROTONIX Take 40 mg by mouth daily.   ramipril 10 MG capsule Commonly known  as:  ALTACE Take 20 mg by mouth daily.   sertraline 50 MG tablet Commonly known as:  ZOLOFT Take 50 mg by mouth daily.   traMADol 50 MG tablet Commonly known as:  ULTRAM Take 1 tablet (50 mg total) by mouth every 12 (twelve) hours as needed for moderate pain.         Diet and Activity recommendation: See Discharge Instructions above   Consults obtained - orthopedic   Major procedures and Radiology Reports - PLEASE review detailed and final reports for all details, in brief -     Dg Chest 2 View  Result Date: 06/08/2017 CLINICAL DATA:  Back pain radiating to the ribs EXAM: CHEST  2 VIEW COMPARISON:  Chest radiograph 06/02/2017 FINDINGS: There is unchanged cardiomegaly and calcific aortic atherosclerosis. Pulmonary vascular congestion is unchanged. There is an enchondroma within the proximal left humerus. No focal consolidation, pleural effusion or pneumothorax. There is a compression fracture of a midthoracic vertebral body that is new  compared to 06/01/2017. IMPRESSION: 1. New compression fracture of a midthoracic vertebral body since 06/01/2017, in keeping with reported midthoracic back pain. 2. Unchanged pulmonary vascular congestion with cardiomegaly and calcific aortic atherosclerosis (ICD10-I70.0). No overt pulmonary edema. Electronically Signed   By: Ulyses Jarred M.D.   On: 06/08/2017 22:07   Dg Thoracic Spine 2 View  Result Date: 06/12/2017 CLINICAL DATA:  Thoracic spinal augmentation.  T6 fracture. EXAM: THORACIC SPINE 2 VIEWS; DG C-ARM 61-120 MIN COMPARISON:  Thoracic spine CT 06/09/2017. FLUOROSCOPY TIME:  1 minutes and 59 seconds C-arm fluoroscopic images were obtained intraoperatively and submitted for post operative interpretation. Please see the performing provider's procedural report for the fluoroscopy time utilized. FINDINGS: Four spot fluoroscopic images of the thoracic spine demonstrate sequential midthoracic kyphoplasty. Numbering limited by small field-of-view.  Final images demonstrate methylmethacrylate extending to the posterior margin of the vertebral body on the lateral view, with possible extension into the pedicle. No demonstrated complication. IMPRESSION: Intraoperative views during midthoracic (T6 based on prior CT) kyphoplasty. Electronically Signed   By: Richardean Sale M.D.   On: 06/12/2017 08:57   Ct Angio Chest Pe W And/or Wo Contrast  Result Date: 06/01/2017 CLINICAL DATA:  Short of breath EXAM: CT ANGIOGRAPHY CHEST WITH CONTRAST TECHNIQUE: Multidetector CT imaging of the chest was performed using the standard protocol during bolus administration of intravenous contrast. Multiplanar CT image reconstructions and MIPs were obtained to evaluate the vascular anatomy. CONTRAST:  10mL ISOVUE-370 IOPAMIDOL (ISOVUE-370) INJECTION 76% COMPARISON:  Chest 06/01/2017 FINDINGS: Cardiovascular: Negative for pulmonary embolism. Pulmonary artery enlargement bilaterally compatible with pulmonary artery hypertension Extensive atherosclerotic calcification aortic arch and coronary artery. Heart size within normal limits. No pericardial effusion Mediastinum/Nodes: Negative for mass or adenopathy Lungs/Pleura: Scarring right medial upper lobe. Chronic lung disease. Negative for infiltrate or effusion. Partially calcified nodule right lung base unchanged from prior PET-CT 03/13/2017 Upper Abdomen: Cirrhosis of the liver. No ascites. Spleen not enlarged Musculoskeletal: Negative Review of the MIP images confirms the above findings. IMPRESSION: Negative for pulmonary embolism.  Pulmonary artery hypertension Diffuse atherosclerotic disease including the coronary artery. Cirrhosis Aortic Atherosclerosis (ICD10-I70.0). Electronically Signed   By: Franchot Gallo M.D.   On: 06/01/2017 21:02   Ct Thoracic Spine Wo Contrast  Result Date: 06/09/2017 CLINICAL DATA:  Marked worsening in thoracic spine pain. The patient has a thoracic compression fracture seen on prior plain films  the chest. The fracture is new since 06/01/2017. EXAM: CT THORACIC SPINE WITHOUT CONTRAST TECHNIQUE: Multidetector CT images of the thoracic were obtained using the standard protocol without intravenous contrast. COMPARISON:  PA and lateral chest 06/08/2017.  CT chest 06/01/2017. FINDINGS: Alignment: Maintained. Vertebrae: As seen on the comparison plain films, the patient has a compression fracture of T6 with vertebral body height loss of up to approximately 90%. There is little to no bony retropulsion and the fracture does not involve the posterior elements. No other fracture is identified. No focal bony lesion is seen. Paraspinal and other soft tissues: Extensive atherosclerotic vascular disease is present. Imaged lung parenchyma is clear with emphysematous change noted. Disc levels: T6-7: Shallow disc bulge without stenosis. T8-9:  Shallow central protrusion without stenosis. 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 L1-2: Ossification of the posterior longitudinal ligament narrows the ventral thecal sac. Except as described, intervertebral disc spaces are unremarkable. IMPRESSION: Acute compression fracture of T6 with vertebral body height loss of up to 90%. No bony retropulsion or involvement of the posterior elements.  Mild thoracic spondylosis as described above. Emphysema. Atherosclerosis. Electronically Signed   By: Inge Rise M.D.   On: 06/09/2017 12:20   Ct Abdomen Pelvis W Contrast  Result Date: 06/05/2017 CLINICAL DATA:  Bilateral back pain radiating to anterior abdomen with nausea and vomiting as well as diarrhea 4 days. EXAM: CT ABDOMEN AND PELVIS WITH CONTRAST TECHNIQUE: Multidetector CT imaging of the abdomen and pelvis was performed using the standard protocol following bolus administration of intravenous contrast. CONTRAST:  38mL ISOVUE-300 IOPAMIDOL (ISOVUE-300) INJECTION 61% COMPARISON:  03/04/2017 and 02/24/2017 FINDINGS:  Lower chest: 6 mm nodule over the medial right lower lobe unchanged. Scarring with mild nodularity over the posterolateral right lower lobe without significant change. Calcification of the right coronary artery and adjacent the mitral valve annulus. Mild prominence of the heart. Hepatobiliary: Nodular contour to the liver with stable hypodense nodules over the right lobe previously evaluated by a PET-CT and found to be non hypermetabolic. Gallbladder and biliary tree are normal. Pancreas: Normal. Spleen: Subcentimeter hypodensity over the inferior aspect of the spleen unchanged likely a cyst or hemangioma. Adrenals/Urinary Tract: Adrenal glands are normal. Kidneys are normal in size without hydronephrosis or nephrolithiasis. Ureters are within normal. Bladder is normal. Stomach/Bowel: Stomach and small bowel are normal. The appendix is normal. Colon is unremarkable. Vascular/Lymphatic: There is mild calcified plaque over the abdominal aorta and iliac arteries. No adenopathy. Reproductive: Within normal. Other: No free fluid or focal inflammatory change. Musculoskeletal: Mild degenerate change of the spine and hips. IMPRESSION: No acute findings in the abdomen/pelvis. Evidence of known cirrhosis with stable right lobe nodules. Scar in the right base. 6 mm nodule over the right lower lobe unchanged. Recommend followup CT 6 months. This recommendation follows the consensus statement: Guidelines for Management of Small Pulmonary Nodules Detected on CT Scans: A Statement from the [employer] as published in Radiology 2005; 237:395-400. Online at: https://www.arnold.com/. Aortic Atherosclerosis (ICD10-I70.0). Electronically Signed   By: Marin Olp M.D.   On: 06/05/2017 18:21   Portable Chest X-ray 1 View  Result Date: 06/02/2017 CLINICAL DATA:  CHF. EXAM: PORTABLE CHEST 1 VIEW COMPARISON:  06/01/2017 chest radiograph and CTA FINDINGS: The cardiac silhouette is borderline  enlarged. Aortic atherosclerosis is noted. There is central pulmonary artery enlargement as described on CT. Slight interstitial prominence is similar to the prior study without overt alveolar edema. No sizable pleural effusion or pneumothorax is seen. A sclerotic lesion is again partially visualized in the proximal left humerus, benign in appearance. IMPRESSION: Mild pulmonary vascular congestion without overt edema. Electronically Signed   By: Logan Bores M.D.   On: 06/02/2017 09:16   Dg Chest Port 1 View  Result Date: 06/01/2017 CLINICAL DATA:  Short of breath EXAM: PORTABLE CHEST 1 VIEW COMPARISON:  03/02/2013 FINDINGS: Cardiac enlargement with mild vascular congestion. Negative for edema or effusion. Mild bibasilar atelectasis. Sclerotic benign appearing lesion proximal left humerus unchanged IMPRESSION: Mild bibasilar atelectasis. Pulmonary vascular congestion Atherosclerotic aorta Electronically Signed   By: Franchot Gallo M.D.   On: 06/01/2017 18:39   Dg C-arm 1-60 Min  Result Date: 06/12/2017 CLINICAL DATA:  Thoracic spinal augmentation.  T6 fracture. EXAM: THORACIC SPINE 2 VIEWS; DG C-ARM 61-120 MIN COMPARISON:  Thoracic spine CT 06/09/2017. FLUOROSCOPY TIME:  1 minutes and 59 seconds C-arm fluoroscopic images were obtained intraoperatively and submitted for post operative interpretation. Please see the performing provider's procedural report for the fluoroscopy time utilized. FINDINGS: Four spot fluoroscopic images of the thoracic spine demonstrate sequential midthoracic kyphoplasty. Numbering  limited by small field-of-view. Final images demonstrate methylmethacrylate extending to the posterior margin of the vertebral body on the lateral view, with possible extension into the pedicle. No demonstrated complication. IMPRESSION: Intraoperative views during midthoracic (T6 based on prior CT) kyphoplasty. Electronically Signed   By: Richardean Sale M.D.   On: 06/12/2017 08:57    Micro Results      Recent Results (from the past 240 hour(s))  Urine culture     Status: None   Collection Time: 06/08/17  9:59 PM  Result Value Ref Range Status   Specimen Description   Final    URINE, RANDOM Performed at [facility], [address] [place], [place] [address]    Special Requests   Final    NONE Performed at [facility], [address] [place], [place] [address]    Culture   Final    NO GROWTH Performed at [facility], [place] [address], [place], [place] [address]    Report Status 06/10/2017 FINAL  Final       Today   Subjective:   Lara Mulch today has no headache,no chest abdominal pain,no new weakness tingling or numbness, feels much better wants to go home today.   Objective:   Blood pressure (!) 128/44, pulse 73, temperature 97.8 F (36.6 C), temperature source Oral, resp. rate 14, height 4\' 10"  (1.473 m), weight 59.4 kg (131 lb), SpO2 97 %.   Intake/Output Summary (Last 24 hours) at 06/15/2017 0942 Last data filed at 06/15/2017 0846 Gross per 24 hour  Intake 363 ml  Output 1952 ml  Net -1589 ml    Exam Awake Alert, Oriented x 3, No new F.N deficits, Normal affect [place].AT,PERRAL Supple Neck,No JVD, No cervical lymphadenopathy appriciated.  Symmetrical Chest wall movement, Good air movement bilaterally, CTAB RRR,No Gallops,Rubs or new Murmurs, No Parasternal Heave +ve B.Sounds, Abd Soft, Non tender, No organomegaly appriciated, No rebound -guarding or rigidity. No Cyanosis, Clubbing or edema, No new Rash or bruise  Data Review   CBC w Diff:  Lab Results  Component Value Date   WBC 7.0 06/12/2017   HGB 12.8 06/12/2017   HGB 13.4 06/15/2014   HCT 38.1 06/12/2017   HCT 40.1 06/15/2014   PLT 292 06/12/2017   PLT 206 06/15/2014   LYMPHOPCT 5 06/01/2017   LYMPHOPCT 23.1 06/15/2014   MONOPCT 7 06/01/2017   MONOPCT 10.3 06/15/2014   EOSPCT 0 06/01/2017   EOSPCT 3.8 06/15/2014   BASOPCT 0 06/01/2017   BASOPCT 0.3  06/15/2014    CMP:  Lab Results  Component Value Date   NA 130 (L) 06/15/2017   NA 136 06/15/2014   K 4.9 06/15/2017   K 4.5 06/15/2014   CL 98 (L) 06/15/2017   CL 101 06/15/2014   CO2 26 06/15/2017   CO2 31 06/15/2014   BUN 43 (H) 06/15/2017   BUN 22 (H) 06/15/2014   CREATININE 1.18 (H) 06/15/2017   CREATININE 0.85 06/15/2014   PROT 8.0 06/08/2017   PROT 7.8 06/13/2014   ALBUMIN 4.1 06/08/2017   ALBUMIN 4.1 06/13/2014   BILITOT 0.9 06/08/2017   BILITOT 0.6 06/13/2014   ALKPHOS 45 06/08/2017   ALKPHOS 35 (L) 06/13/2014   AST 40 06/08/2017   AST 38 (H) 06/13/2014   ALT 19 06/08/2017   ALT 25 06/13/2014  .   Total Time in preparing paper work, data evaluation and todays exam - 35 minutes  Epifanio Lesches M.D on 06/15/2017 at 9:42 AM  Note: This dictation was prepared with Dragon dictation along with smaller phrase technology. Any transcriptional errors that result from this process are unintentional.

## 2017-06-18 ENCOUNTER — Ambulatory Visit: Payer: Medicare Other

## 2017-06-22 ENCOUNTER — Inpatient Hospital Stay: Payer: No Typology Code available for payment source | Attending: Internal Medicine

## 2017-06-22 ENCOUNTER — Inpatient Hospital Stay (HOSPITAL_BASED_OUTPATIENT_CLINIC_OR_DEPARTMENT_OTHER): Payer: No Typology Code available for payment source | Admitting: Internal Medicine

## 2017-06-22 VITALS — BP 90/60 | HR 75 | Temp 97.6°F | Resp 20 | Ht <= 58 in | Wt 134.0 lb

## 2017-06-22 DIAGNOSIS — Z87891 Personal history of nicotine dependence: Secondary | ICD-10-CM | POA: Diagnosis not present

## 2017-06-22 DIAGNOSIS — K7460 Unspecified cirrhosis of liver: Secondary | ICD-10-CM | POA: Insufficient documentation

## 2017-06-22 DIAGNOSIS — E162 Hypoglycemia, unspecified: Secondary | ICD-10-CM | POA: Diagnosis not present

## 2017-06-22 DIAGNOSIS — D696 Thrombocytopenia, unspecified: Secondary | ICD-10-CM | POA: Diagnosis not present

## 2017-06-22 DIAGNOSIS — G8929 Other chronic pain: Secondary | ICD-10-CM | POA: Insufficient documentation

## 2017-06-22 DIAGNOSIS — K769 Liver disease, unspecified: Secondary | ICD-10-CM

## 2017-06-22 DIAGNOSIS — Z7982 Long term (current) use of aspirin: Secondary | ICD-10-CM | POA: Insufficient documentation

## 2017-06-22 DIAGNOSIS — E11649 Type 2 diabetes mellitus with hypoglycemia without coma: Secondary | ICD-10-CM

## 2017-06-22 LAB — CBC WITH DIFFERENTIAL/PLATELET
BASOS ABS: 0 10*3/uL (ref 0–0.1)
Basophils Relative: 0 %
Eosinophils Absolute: 0.2 10*3/uL (ref 0–0.7)
Eosinophils Relative: 5 %
HEMATOCRIT: 35.1 % (ref 35.0–47.0)
HEMOGLOBIN: 11.8 g/dL — AB (ref 12.0–16.0)
LYMPHS ABS: 1.1 10*3/uL (ref 1.0–3.6)
LYMPHS PCT: 24 %
MCH: 30.7 pg (ref 26.0–34.0)
MCHC: 33.5 g/dL (ref 32.0–36.0)
MCV: 91.6 fL (ref 80.0–100.0)
Monocytes Absolute: 0.4 10*3/uL (ref 0.2–0.9)
Monocytes Relative: 9 %
NEUTROS ABS: 2.7 10*3/uL (ref 1.4–6.5)
NEUTROS PCT: 62 %
PLATELETS: 138 10*3/uL — AB (ref 150–440)
RBC: 3.83 MIL/uL (ref 3.80–5.20)
RDW: 13.9 % (ref 11.5–14.5)
WBC: 4.4 10*3/uL (ref 3.6–11.0)

## 2017-06-22 LAB — COMPREHENSIVE METABOLIC PANEL
ALT: 19 U/L (ref 14–54)
AST: 41 U/L (ref 15–41)
Albumin: 3.8 g/dL (ref 3.5–5.0)
Alkaline Phosphatase: 51 U/L (ref 38–126)
Anion gap: 9 (ref 5–15)
BILIRUBIN TOTAL: 0.8 mg/dL (ref 0.3–1.2)
BUN: 30 mg/dL — AB (ref 6–20)
CHLORIDE: 100 mmol/L — AB (ref 101–111)
CO2: 24 mmol/L (ref 22–32)
CREATININE: 0.99 mg/dL (ref 0.44–1.00)
Calcium: 9.1 mg/dL (ref 8.9–10.3)
GFR calc Af Amer: 60 mL/min — ABNORMAL LOW (ref >60)
GFR, EST NON AFRICAN AMERICAN: 52 mL/min — AB (ref >60)
Glucose, Bld: 120 mg/dL — ABNORMAL HIGH (ref 65–99)
Potassium: 4.6 mmol/L (ref 3.5–5.1)
Sodium: 133 mmol/L — ABNORMAL LOW (ref 135–145)
Total Protein: 6.9 g/dL (ref 6.5–8.1)

## 2017-06-22 NOTE — Progress Notes (Signed)
[facility] CONSULT NOTE  Patient Care Team: Tracie Harrier, MD as PCP - General (Internal Medicine) Clent Jacks, RN as Registered Nurse  CHIEF COMPLAINTS/PURPOSE OF CONSULTATION: Liver lesions/hypoglycemia  # sep 2018- Liver lesions/cirrhotic liver [AFp- 16]  # Hypoglycemia [sep 1324]; resolved   # EF- 20% [Dr.Kowalski- cath/ no blocks]  #   No history exists.     HISTORY OF PRESENTING ILLNESS: Patient speaks minimal English; interpreter/daughter.   Jacqueline [name] 84 y.o.  female recently admitted to the hospital for hypoglycemia; incidentally noted to have liver lesions is here for follow-up.  Unfortunately patient recently had worsening back pain that led to evaluation with orthopedics that showed a fractured vertebral body.  She needed kyphoplasty.  Pain is improved complete not resolved.   Patient had evaluation with endocrinology-given her hypoglycemia.  Currently improved. Otherwise appetite is good.  Patient is currently in the rehab.   ROS: A complete 10 point review of system is done which is negative except mentioned above in history of present illness  MEDICAL HISTORY:  Past Medical History:  Diagnosis Date  . CHF (congestive heart failure) ([place])   . Diabetes mellitus without complication ([place]) 40/03/2724   Currently no high BS.  Has come off meds.  But having episodes of low BS now.  . Dilated idiopathic cardiomyopathy ([place])   . GERD (gastroesophageal reflux disease)   . Hyperlipidemia   . Hypertension   . Hypothyroidism   . Renal disorder     SURGICAL HISTORY: Past Surgical History:  Procedure Laterality Date  . BUNIONECTOMY    . COLONOSCOPY    . KYPHOPLASTY N/A 06/12/2017   Procedure: [id number];  Surgeon: Hessie Knows, MD;  Location: [facility] ORS;  Service: Orthopedics;  Laterality: N/A;  . TONSILLECTOMY      SOCIAL HISTORY: Social History   Socioeconomic History  . Marital status: Widowed    Spouse name: Not on file   . Number of children: Not on file  . Years of education: Not on file  . Highest education level: Not on file  Social Needs  . Financial resource strain: Not on file  . Food insecurity - worry: Not on file  . Food insecurity - inability: Not on file  . Transportation needs - medical: Not on file  . Transportation needs - non-medical: Not on file  Occupational History  . Not on file  Tobacco Use  . Smoking status: Former Research scientist (life sciences)  . Smokeless tobacco: Never Used  Substance and Sexual Activity  . Alcohol use: No  . Drug use: No  . Sexual activity: No  Other Topics Concern  . Not on file  Social History Narrative  . Not on file    FAMILY HISTORY: Family History  Problem Relation Age of Onset  . Premature CHD Brother     ALLERGIES:  is allergic to ciprofloxacin; digoxin and related; erythromycin; lopid [gemfibrozil]; nitrofurantoin; cefuroxime; and penicillins.  MEDICATIONS:  Current Outpatient Medications  Medication Sig Dispense Refill  . aspirin EC 81 MG tablet Take 81 mg by mouth daily.    Marland Kitchen atorvastatin (LIPITOR) 40 MG tablet Take 2 tablets (80 mg total) by mouth daily at 6 PM. 30 tablet 1  . carvedilol (COREG) 12.5 MG tablet Take by mouth 2 (two) times daily with a meal.     . docusate sodium (COLACE) 100 MG capsule Take 1 capsule (100 mg total) by mouth 2 (two) times daily. 10 capsule 0  . ergocalciferol (VITAMIN D2) 50000 units  capsule Take 50,000 Units by mouth once a week.    . fenofibrate (TRICOR) 145 MG tablet Take 145 mg by mouth daily.    . furosemide (LASIX) 20 MG tablet Take 1 tablet (20 mg total) by mouth daily. 30 tablet 0  . glucose blood (FREESTYLE LITE) test strip USE TO CHECK BLOOD SUGARS TWICE A DAY    . HYDROcodone-acetaminophen (NORCO/VICODIN) 5-325 MG tablet Take 1-2 tablets by mouth every 4 (four) hours as needed for moderate pain. 30 tablet 0  . Iron-FA-B Cmp-C-Biot-Probiotic (FUSION PLUS) CAPS Take 1 capsule by mouth daily.     . isosorbide  mononitrate (IMDUR) 60 MG 24 hr tablet Take 0.5 tablets (30 mg total) by mouth daily. 30 tablet 1  . levothyroxine (SYNTHROID, LEVOTHROID) 25 MCG tablet Take 25 mcg by mouth daily before breakfast.    . meclizine (ANTIVERT) 25 MG tablet Take 12.5 mg by mouth 2 (two) times daily.    . pantoprazole (PROTONIX) 40 MG tablet Take 40 mg by mouth daily.    . ramipril (ALTACE) 10 MG capsule Take 20 mg by mouth daily.     . sertraline (ZOLOFT) 50 MG tablet Take 50 mg by mouth daily.    . traMADol (ULTRAM) 50 MG tablet Take 1 tablet (50 mg total) by mouth every 12 (twelve) hours as needed for moderate pain. 20 tablet 0   No current facility-administered medications for this visit.       Marland Kitchen  PHYSICAL EXAMINATION: ECOG PERFORMANCE STATUS: 0 - Asymptomatic  Vitals:   06/22/17 1146 06/22/17 1152  BP: (!) 88/64 90/60  Pulse: 75   Resp: 20   Temp: 97.6 F (36.4 C)    Filed Weights   06/22/17 1146  Weight: 134 lb (60.8 kg)    GENERAL: Well-nourished well-developed; Alert, no distress and comfortable. Walking with a rolling walker.   With her daughter EYES: no pallor or icterus OROPHARYNX: no thrush or ulceration; good dentition  NECK: supple, no masses felt LYMPH:  no palpable lymphadenopathy in the cervical, axillary or inguinal regions LUNGS: clear to auscultation and  No wheeze or crackles HEART/CVS: regular rate & rhythm and no murmurs; No lower extremity edema ABDOMEN: abdomen soft, non-tender and normal bowel sounds Musculoskeletal:no cyanosis of digits and no clubbing  PSYCH: alert & oriented x 3 with fluent speech NEURO: no focal motor/sensory deficits SKIN:  no rashes or significant lesions  LABORATORY DATA:  I have reviewed the data as listed Lab Results  Component Value Date   WBC 4.4 06/22/2017   HGB 11.8 (L) 06/22/2017   HCT 35.1 06/22/2017   MCV 91.6 06/22/2017   PLT 138 (L) 06/22/2017   Recent Labs    06/01/17 1924  06/08/17 2127  06/14/17 0647 06/15/17 0422  06/22/17 1125  NA 130*   < > 127*   < > 131* 130* 133*  K 4.8   < > 4.9   < > 5.0 4.9 4.6  CL 97*   < > 92*   < > 98* 98* 100*  CO2 25   < > 27   < > 27 26 24   GLUCOSE 260*   < > 137*   < > 124* 105* 120*  BUN 51*   < > 39*   < > 46* 43* 30*  CREATININE 1.48*   < > 2.16*   < > 1.12* 1.18* 0.99  CALCIUM 8.7*   < > 9.4   < > 9.0 8.9 9.1  GFRNONAA 32*   < >  20*   < > 44* 42* 52*  GFRAA 37*   < > 23*   < > 52* 48* 60*  PROT 7.0  --  8.0  --   --   --  6.9  ALBUMIN 3.7  --  4.1  --   --   --  3.8  AST 33  --  40  --   --   --  41  ALT 13*  --  19  --   --   --  19  ALKPHOS 48  --  45  --   --   --  51  BILITOT 1.4*  --  0.9  --   --   --  0.8  BILIDIR  --   --  0.2  --   --   --   --   IBILI  --   --  0.7  --   --   --   --    < > = values in this interval not displayed.    RADIOGRAPHIC STUDIES: I have personally reviewed the radiological images as listed and agreed with the findings in the report. Dg Chest 2 View  Result Date: 06/08/2017 CLINICAL DATA:  Back pain radiating to the ribs EXAM: CHEST  2 VIEW COMPARISON:  Chest radiograph 06/02/2017 FINDINGS: There is unchanged cardiomegaly and calcific aortic atherosclerosis. Pulmonary vascular congestion is unchanged. There is an enchondroma within the proximal left humerus. No focal consolidation, pleural effusion or pneumothorax. There is a compression fracture of a midthoracic vertebral body that is new compared to 06/01/2017. IMPRESSION: 1. New compression fracture of a midthoracic vertebral body since 06/01/2017, in keeping with reported midthoracic back pain. 2. Unchanged pulmonary vascular congestion with cardiomegaly and calcific aortic atherosclerosis (ICD10-I70.0). No overt pulmonary edema. Electronically Signed   By: Ulyses Jarred M.D.   On: 06/08/2017 22:07   Dg Thoracic Spine 2 View  Result Date: 06/12/2017 CLINICAL DATA:  Thoracic spinal augmentation.  T6 fracture. EXAM: THORACIC SPINE 2 VIEWS; DG C-ARM 61-120 MIN COMPARISON:   Thoracic spine CT 06/09/2017. FLUOROSCOPY TIME:  1 minutes and 59 seconds C-arm fluoroscopic images were obtained intraoperatively and submitted for post operative interpretation. Please see the performing provider's procedural report for the fluoroscopy time utilized. FINDINGS: Four spot fluoroscopic images of the thoracic spine demonstrate sequential midthoracic kyphoplasty. Numbering limited by small field-of-view. Final images demonstrate methylmethacrylate extending to the posterior margin of the vertebral body on the lateral view, with possible extension into the pedicle. No demonstrated complication. IMPRESSION: Intraoperative views during midthoracic (T6 based on prior CT) kyphoplasty. Electronically Signed   By: Richardean Sale M.D.   On: 06/12/2017 08:57   Ct Angio Chest Pe W And/or Wo Contrast  Result Date: 06/01/2017 CLINICAL DATA:  Short of breath EXAM: CT ANGIOGRAPHY CHEST WITH CONTRAST TECHNIQUE: Multidetector CT imaging of the chest was performed using the standard protocol during bolus administration of intravenous contrast. Multiplanar CT image reconstructions and MIPs were obtained to evaluate the vascular anatomy. CONTRAST:  57mL ISOVUE-370 IOPAMIDOL (ISOVUE-370) INJECTION 76% COMPARISON:  Chest 06/01/2017 FINDINGS: Cardiovascular: Negative for pulmonary embolism. Pulmonary artery enlargement bilaterally compatible with pulmonary artery hypertension Extensive atherosclerotic calcification aortic arch and coronary artery. Heart size within normal limits. No pericardial effusion Mediastinum/Nodes: Negative for mass or adenopathy Lungs/Pleura: Scarring right medial upper lobe. Chronic lung disease. Negative for infiltrate or effusion. Partially calcified nodule right lung base unchanged from prior PET-CT 03/13/2017 Upper Abdomen: Cirrhosis of the liver. No ascites. Spleen  not enlarged Musculoskeletal: Negative Review of the MIP images confirms the above findings. IMPRESSION: Negative for  pulmonary embolism.  Pulmonary artery hypertension Diffuse atherosclerotic disease including the coronary artery. Cirrhosis Aortic Atherosclerosis (ICD10-I70.0). Electronically Signed   By: Franchot Gallo M.D.   On: 06/01/2017 21:02   Ct Thoracic Spine Wo Contrast  Result Date: 06/09/2017 CLINICAL DATA:  Marked worsening in thoracic spine pain. The patient has a thoracic compression fracture seen on prior plain films the chest. The fracture is new since 06/01/2017. EXAM: CT THORACIC SPINE WITHOUT CONTRAST TECHNIQUE: Multidetector CT images of the thoracic were obtained using the standard protocol without intravenous contrast. COMPARISON:  PA and lateral chest 06/08/2017.  CT chest 06/01/2017. FINDINGS: Alignment: Maintained. Vertebrae: As seen on the comparison plain films, the patient has a compression fracture of T6 with vertebral body height loss of up to approximately 90%. There is little to no bony retropulsion and the fracture does not involve the posterior elements. No other fracture is identified. No focal bony lesion is seen. Paraspinal and other soft tissues: Extensive atherosclerotic vascular disease is present. Imaged lung parenchyma is clear with emphysematous change noted. Disc levels: T6-7: Shallow disc bulge without stenosis. T8-9:  Shallow central protrusion without stenosis. 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 L1-2: Ossification of the posterior longitudinal ligament narrows the ventral thecal sac. Except as described, intervertebral disc spaces are unremarkable. IMPRESSION: Acute compression fracture of T6 with vertebral body height loss of up to 90%. No bony retropulsion or involvement of the posterior elements. Mild thoracic spondylosis as described above. Emphysema. Atherosclerosis. Electronically Signed   By: Inge Rise M.D.   On: 06/09/2017 12:20   Ct Abdomen Pelvis W Contrast  Result Date: 06/05/2017 CLINICAL  DATA:  Bilateral back pain radiating to anterior abdomen with nausea and vomiting as well as diarrhea 4 days. EXAM: CT ABDOMEN AND PELVIS WITH CONTRAST TECHNIQUE: Multidetector CT imaging of the abdomen and pelvis was performed using the standard protocol following bolus administration of intravenous contrast. CONTRAST:  16mL ISOVUE-300 IOPAMIDOL (ISOVUE-300) INJECTION 61% COMPARISON:  03/04/2017 and 02/24/2017 FINDINGS: Lower chest: 6 mm nodule over the medial right lower lobe unchanged. Scarring with mild nodularity over the posterolateral right lower lobe without significant change. Calcification of the right coronary artery and adjacent the mitral valve annulus. Mild prominence of the heart. Hepatobiliary: Nodular contour to the liver with stable hypodense nodules over the right lobe previously evaluated by a PET-CT and found to be non hypermetabolic. Gallbladder and biliary tree are normal. Pancreas: Normal. Spleen: Subcentimeter hypodensity over the inferior aspect of the spleen unchanged likely a cyst or hemangioma. Adrenals/Urinary Tract: Adrenal glands are normal. Kidneys are normal in size without hydronephrosis or nephrolithiasis. Ureters are within normal. Bladder is normal. Stomach/Bowel: Stomach and small bowel are normal. The appendix is normal. Colon is unremarkable. Vascular/Lymphatic: There is mild calcified plaque over the abdominal aorta and iliac arteries. No adenopathy. Reproductive: Within normal. Other: No free fluid or focal inflammatory change. Musculoskeletal: Mild degenerate change of the spine and hips. IMPRESSION: No acute findings in the abdomen/pelvis. Evidence of known cirrhosis with stable right lobe nodules. Scar in the right base. 6 mm nodule over the right lower lobe unchanged. Recommend followup CT 6 months. This recommendation follows the consensus statement: Guidelines for Management of Small Pulmonary Nodules Detected on CT Scans: A Statement from the [place] as  published in Radiology 2005; [id number]:[id number]. Online at: [email or web address] Aortic Atherosclerosis (ICD10-I70.0). Electronically  Signed   By: Marin Olp M.D.   On: 06/05/2017 18:21   Portable Chest X-ray 1 View  Result Date: 06/02/2017 CLINICAL DATA:  CHF. EXAM: PORTABLE CHEST 1 VIEW COMPARISON:  06/01/2017 chest radiograph and CTA FINDINGS: The cardiac silhouette is borderline enlarged. Aortic atherosclerosis is noted. There is central pulmonary artery enlargement as described on CT. Slight interstitial prominence is similar to the prior study without overt alveolar edema. No sizable pleural effusion or pneumothorax is seen. A sclerotic lesion is again partially visualized in the proximal left humerus, benign in appearance. IMPRESSION: Mild pulmonary vascular congestion without overt edema. Electronically Signed   By: Logan Bores M.D.   On: 06/02/2017 09:16   Dg Chest Port 1 View  Result Date: 06/01/2017 CLINICAL DATA:  Short of breath EXAM: PORTABLE CHEST 1 VIEW COMPARISON:  03/02/2013 FINDINGS: Cardiac enlargement with mild vascular congestion. Negative for edema or effusion. Mild bibasilar atelectasis. Sclerotic benign appearing lesion proximal left humerus unchanged IMPRESSION: Mild bibasilar atelectasis. Pulmonary vascular congestion Atherosclerotic aorta Electronically Signed   By: Franchot Gallo M.D.   On: 06/01/2017 18:39   Dg C-arm 1-60 Min  Result Date: 06/12/2017 CLINICAL DATA:  Thoracic spinal augmentation.  T6 fracture. EXAM: THORACIC SPINE 2 VIEWS; DG C-ARM 61-120 MIN COMPARISON:  Thoracic spine CT 06/09/2017. FLUOROSCOPY TIME:  1 minutes and 59 seconds C-arm fluoroscopic images were obtained intraoperatively and submitted for post operative interpretation. Please see the performing provider's procedural report for the fluoroscopy time utilized. FINDINGS: Four spot fluoroscopic images of the thoracic spine demonstrate sequential midthoracic  kyphoplasty. Numbering limited by small field-of-view. Final images demonstrate methylmethacrylate extending to the posterior margin of the vertebral body on the lateral view, with possible extension into the pedicle. No demonstrated complication. IMPRESSION: Intraoperative views during midthoracic (T6 based on prior CT) kyphoplasty. Electronically Signed   By: Richardean Sale M.D.   On: 06/12/2017 08:57    ASSESSMENT & PLAN:   Liver lesion # Liver lesions in a cirrhotic liver. AFP elevated at 16;? malignancy versus benign lesions. Liver dedicated CT scan- showed 2 small lesions.   #Given the unclear nature of the lesions on CT scans recommend holding off any further workup at this time. PET- no metabolically active lesions noted; ideally MRI would be recommended-however MRI cannot be done because of tolerance issues.  Reviewed at the tumor conference.   #Back pain status post kyphoplasty improved; however continues to have chronic back pain.  Defer to orthopedics.  # cirrhosis- ? Etiology. Hep work up neg;? Fatty liver vs. Others. Recommend GI eval. Defer to PCP.   #Mild thrombocytopenia-intermittent.  Monitor for now.  #Hypoglycemia-improved after stopping antidiabetic medications.  No concerns for insulinoma.  Status post evaluation at [facility] clinic.  # follow up in 6 months/labs-AFP.  Above plan reviewed with the daughter in detail.   All questions were answered. The patient knows to call the clinic with any problems, questions or concerns.    Jacqueline Sickle, MD 06/23/2017 12:48 PM

## 2017-06-22 NOTE — Assessment & Plan Note (Addendum)
#   Liver lesions in a cirrhotic liver. AFP elevated at 16;? malignancy versus benign lesions. Liver dedicated CT scan- showed 2 small lesions.   #Given the unclear nature of the lesions on CT scans recommend holding off any further workup at this time. PET- no metabolically active lesions noted; ideally MRI would be recommended-however MRI cannot be done because of tolerance issues.  Reviewed at the tumor conference.   #Back pain status post kyphoplasty improved; however continues to have chronic back pain.  Defer to orthopedics.  # cirrhosis- ? Etiology. Hep work up neg;? Fatty liver vs. Others. Recommend GI eval. Defer to PCP.   #Mild thrombocytopenia-intermittent.  Monitor for now.  #Hypoglycemia-improved after stopping antidiabetic medications.  No concerns for insulinoma.  Status post evaluation at [facility] clinic.  # follow up in 6 months/labs-AFP.  Above plan reviewed with the daughter in detail.

## 2017-06-23 LAB — AFP TUMOR MARKER: AFP, SERUM, TUMOR MARKER: 2.2 ng/mL (ref 0.0–8.3)

## 2017-06-25 ENCOUNTER — Ambulatory Visit: Payer: Medicare Other | Admitting: Family

## 2017-07-16 DIAGNOSIS — Z9889 Other specified postprocedural states: Secondary | ICD-10-CM | POA: Insufficient documentation

## 2017-08-12 DIAGNOSIS — M810 Age-related osteoporosis without current pathological fracture: Secondary | ICD-10-CM | POA: Insufficient documentation

## 2017-10-08 ENCOUNTER — Emergency Department
Admission: EM | Admit: 2017-10-08 | Discharge: 2017-10-09 | Disposition: A | Discharge status: Home / Self Care | Payer: Medicare Other | Attending: Emergency Medicine | Admitting: Emergency Medicine

## 2017-10-08 ENCOUNTER — Emergency Department: Payer: Medicare Other

## 2017-10-08 ENCOUNTER — Other Ambulatory Visit: Payer: Self-pay

## 2017-10-08 DIAGNOSIS — Z79899 Other long term (current) drug therapy: Secondary | ICD-10-CM | POA: Insufficient documentation

## 2017-10-08 DIAGNOSIS — I5022 Chronic systolic (congestive) heart failure: Secondary | ICD-10-CM | POA: Diagnosis not present

## 2017-10-08 DIAGNOSIS — I160 Hypertensive urgency: Secondary | ICD-10-CM | POA: Diagnosis not present

## 2017-10-08 DIAGNOSIS — E039 Hypothyroidism, unspecified: Secondary | ICD-10-CM | POA: Diagnosis not present

## 2017-10-08 DIAGNOSIS — R0789 Other chest pain: Secondary | ICD-10-CM | POA: Diagnosis present

## 2017-10-08 DIAGNOSIS — Z7982 Long term (current) use of aspirin: Secondary | ICD-10-CM | POA: Diagnosis not present

## 2017-10-08 DIAGNOSIS — E119 Type 2 diabetes mellitus without complications: Secondary | ICD-10-CM | POA: Insufficient documentation

## 2017-10-08 DIAGNOSIS — Z87891 Personal history of nicotine dependence: Secondary | ICD-10-CM | POA: Diagnosis not present

## 2017-10-08 DIAGNOSIS — I110 Hypertensive heart disease with heart failure: Secondary | ICD-10-CM | POA: Insufficient documentation

## 2017-10-08 DIAGNOSIS — I252 Old myocardial infarction: Secondary | ICD-10-CM | POA: Diagnosis not present

## 2017-10-08 DIAGNOSIS — R42 Dizziness and giddiness: Secondary | ICD-10-CM

## 2017-10-08 DIAGNOSIS — R079 Chest pain, unspecified: Secondary | ICD-10-CM

## 2017-10-08 LAB — BASIC METABOLIC PANEL
Anion gap: 8 (ref 5–15)
BUN: 21 mg/dL — AB (ref 6–20)
CHLORIDE: 99 mmol/L — AB (ref 101–111)
CO2: 29 mmol/L (ref 22–32)
Calcium: 9 mg/dL (ref 8.9–10.3)
Creatinine, Ser: 0.82 mg/dL (ref 0.44–1.00)
GFR calc Af Amer: >60 mL/min (ref >60)
GFR calc non Af Amer: >60 mL/min (ref >60)
Glucose, Bld: 118 mg/dL — ABNORMAL HIGH (ref 65–99)
POTASSIUM: 3.9 mmol/L (ref 3.5–5.1)
SODIUM: 136 mmol/L (ref 135–145)

## 2017-10-08 LAB — CBC
HEMATOCRIT: 42 % (ref 35.0–47.0)
Hemoglobin: 14.3 g/dL (ref 12.0–16.0)
MCH: 30.8 pg (ref 26.0–34.0)
MCHC: 33.9 g/dL (ref 32.0–36.0)
MCV: 90.6 fL (ref 80.0–100.0)
Platelets: 160 10*3/uL (ref 150–440)
RBC: 4.63 MIL/uL (ref 3.80–5.20)
RDW: 13.7 % (ref 11.5–14.5)
WBC: 5.3 10*3/uL (ref 3.6–11.0)

## 2017-10-08 LAB — TROPONIN I: Troponin I: 0.04 ng/mL (ref <0.03)

## 2017-10-08 MED ORDER — LISINOPRIL 10 MG PO TABS
20.0000 mg | ORAL_TABLET | Freq: Once | ORAL | Status: AC
Start: 2017-10-08 — End: 2017-10-08
  Administered 2017-10-08: 20 mg via ORAL
  Filled 2017-10-08: qty 2

## 2017-10-08 MED ORDER — CARVEDILOL 6.25 MG PO TABS
12.5000 mg | ORAL_TABLET | Freq: Once | ORAL | Status: AC
  Administered 2017-10-08: 12.5 mg via ORAL
  Filled 2017-10-08: qty 2

## 2017-10-08 MED ORDER — NITROGLYCERIN 0.4 MG SL SUBL
0.4000 mg | SUBLINGUAL_TABLET | SUBLINGUAL | Status: DC | PRN
  Administered 2017-10-08: 0.4 mg via SUBLINGUAL
  Filled 2017-10-08: qty 1

## 2017-10-08 NOTE — ED Notes (Signed)
Date and time results received: 10/08/17 2222   Test: troponin Critical Value: 0.04  Name of Provider Notified: Dr. Alfred Levins

## 2017-10-08 NOTE — ED Triage Notes (Addendum)
Pt arrive to ED via [facility] from home. Pt wears 2 L nasal cannula at home, arrives on 2 L. BP was 202/96. C/o HTN, dizzy, weak, and chest heaviness. Hx of HTN. Pt states "I used to take BP medicine." unsure if currently taking any BP meds. Pt c/o needing to urinate, placed on bedpan upon arrival. Pt states Dr. Nehemiah Massed "you know the heart doctor has placed me on some medicine and I feel sick" when asked how long she has had these symptoms.

## 2017-10-08 NOTE — ED Provider Notes (Signed)
[facility] Emergency Department Provider Note  ____________________________________________  Time seen: Approximately 10:37 PM  I have reviewed the triage vital signs and the nursing notes.   HISTORY  Chief Complaint Hypertension  Level 5 caveat:  Portions of the history and physical were unable to be obtained due to poor historian   HPI Jacqueline [name] is a 84 y.o. female with a history of hypertension, CAD, cardiomyopathy EF 30-35%, diabetes, hypertension, hyperlipidemia who presents for evaluation of elevated blood pressure, chest pain or shortness of breath.  Patient has not taken her evening lisinopril and Coreg.  Her son-in-law went to check on her and she was complaining of shortness of breath and was feeling dizzy.  He checked her blood pressure which was 202/96 and they called 911.  Patient has a very odd affect and very hesitant in answering questions but will eventually speak and answer them.  According to the daughter this is her baseline.  She tells me that she is feeling mild tightness in her chest associated with shortness of breath and has had intermittent episodes of dizziness/lightheadedness since earlier today.  No recent changes on her blood pressure medication.  According to the daughter her blood pressure has been elevated for several weeks.  She recently saw her cardiologist a few days ago and no changes in the medication was made.  Patient denies ever having a heart attack.  She denies family history of heart attacks.  She was never a smoker.   Past Medical History:  Diagnosis Date  . CHF (congestive heart failure) ([place])   . Diabetes mellitus without complication ([place]) 78/93/8101   Currently no high BS.  Has come off meds.  But having episodes of low BS now.  . Dilated idiopathic cardiomyopathy ([place])   . GERD (gastroesophageal reflux disease)   . Hyperlipidemia   . Hypertension   . Hypothyroidism   . Renal disorder     Patient Active  Problem List   Diagnosis Date Noted  . Hyponatremia 06/09/2017  . MI, acute, non ST segment elevation ([place]) 06/01/2017  . Acute upper back pain 03/05/2017  . Carcinoma of unknown primary ([place]) 03/05/2017  . Liver lesion 02/27/2017  . Diabetes ([place]) 02/24/2017  . Chronic systolic CHF (congestive heart failure) ([place]) 02/24/2017  . HTN (hypertension) 02/24/2017  . GERD (gastroesophageal reflux disease) 02/24/2017  . Hypothyroidism 02/24/2017  . Hypoglycemia 02/24/2017  . Dizziness 02/18/2017    Past Surgical History:  Procedure Laterality Date  . BUNIONECTOMY    . COLONOSCOPY    . KYPHOPLASTY N/A 06/12/2017   Procedure: [id number];  Surgeon: Hessie Knows, MD;  Location: [facility] ORS;  Service: Orthopedics;  Laterality: N/A;  . TONSILLECTOMY      Prior to Admission medications   Medication Sig Start Date End Date Taking? Authorizing Provider  alendronate (FOSAMAX) 70 MG tablet Take 70 mg by mouth every 7 (seven) days. 10/02/17   [provider]  aspirin EC 81 MG tablet Take 81 mg by mouth daily.    [provider]  atorvastatin (LIPITOR) 40 MG tablet Take 2 tablets (80 mg total) by mouth daily at 6 PM. Patient not taking: Reported on 10/08/2017 06/04/17   Henreitta Leber, MD  carvedilol (COREG) 12.5 MG tablet Take by mouth 2 (two) times daily with a meal.     [provider]  docusate sodium (COLACE) 100 MG capsule Take 1 capsule (100 mg total) by mouth 2 (two) times daily. 06/15/17   Epifanio Lesches,  MD  ergocalciferol (VITAMIN D2) 50000 units capsule Take 50,000 Units by mouth once a week.    [provider]  fenofibrate (TRICOR) 145 MG tablet Take 145 mg by mouth daily.    [provider]  furosemide (LASIX) 20 MG tablet Take 1 tablet (20 mg total) by mouth daily. Patient not taking: Reported on 10/08/2017 06/16/17   Epifanio Lesches, MD  furosemide (LASIX) 40 MG tablet Take 20-40 mg by mouth 2 (two) times daily. 40 mg in the  morning and 20 mg in the evening 09/06/17   [provider]  glucose blood (FREESTYLE LITE) test strip USE TO CHECK BLOOD SUGARS TWICE A DAY 02/19/16   [provider]  HYDROcodone-acetaminophen (NORCO/VICODIN) 5-325 MG tablet Take 1-2 tablets by mouth every 4 (four) hours as needed for moderate pain. Patient not taking: Reported on 10/08/2017 06/15/17   Epifanio Lesches, MD  Iron-FA-B Cmp-C-Biot-Probiotic (FUSION PLUS) CAPS Take 1 capsule by mouth daily.     [provider]  isosorbide mononitrate (IMDUR) 60 MG 24 hr tablet Take 0.5 tablets (30 mg total) by mouth daily. 06/05/17   Henreitta Leber, MD  KLOR-CON M10 10 MEQ tablet Take 10 mEq by mouth daily. 07/23/17   [provider]  levothyroxine (SYNTHROID, LEVOTHROID) 25 MCG tablet Take 25 mcg by mouth daily before breakfast.    [provider]  lisinopril (PRINIVIL,ZESTRIL) 20 MG tablet Take 20 mg by mouth 2 (two) times daily. 08/25/17   [provider]  meclizine (ANTIVERT) 25 MG tablet Take 12.5 mg by mouth 2 (two) times daily.    [provider]  pantoprazole (PROTONIX) 40 MG tablet Take 40 mg by mouth daily.    [provider]  ramipril (ALTACE) 10 MG capsule Take 20 mg by mouth daily.     [provider]  sertraline (ZOLOFT) 50 MG tablet Take 50 mg by mouth daily.    [provider]  traMADol (ULTRAM) 50 MG tablet Take 1 tablet (50 mg total) by mouth every 12 (twelve) hours as needed for moderate pain. Patient not taking: Reported on 10/08/2017 06/15/17   Epifanio Lesches, MD  vitamin B-12 (CYANOCOBALAMIN) 500 MCG tablet Take 500 mcg by mouth 2 (two) times daily.    [provider]    Allergies Ciprofloxacin; Digoxin and related; Erythromycin; Lopid [gemfibrozil]; Nitrofurantoin; Cefuroxime; and Penicillins  Family History  Problem Relation Age of Onset  . Premature CHD Brother     Social History Social History   Tobacco Use  .  Smoking status: Former Research scientist (life sciences)  . Smokeless tobacco: Never Used  Substance Use Topics  . Alcohol use: No  . Drug use: No    Review of Systems  Constitutional: Negative for fever. + Dizziness Eyes: Negative for visual changes. ENT: Negative for sore throat. Neck: No neck pain  Cardiovascular: + chest pain. Respiratory: + shortness of breath. Gastrointestinal: Negative for abdominal pain, vomiting or diarrhea. Genitourinary: Negative for dysuria. Musculoskeletal: Negative for back pain. Skin: Negative for rash. Neurological: Negative for headaches, weakness or numbness. Psych: No SI or HI  ____________________________________________   PHYSICAL EXAM:  VITAL SIGNS: ED Triage Vitals  Enc Vitals Group     BP 10/08/17 2124 (!) 192/78     Pulse Rate 10/08/17 2124 81     Resp 10/08/17 2124 13     Temp 10/08/17 2124 98.2 F (36.8 C)     Temp Source 10/08/17 2124 Oral     SpO2 10/08/17 2124 98 %  Weight 10/08/17 2122 120 lb (54.4 kg)     Height 10/08/17 2122 5\' 1"  (1.549 m)     Head Circumference --      Peak Flow --      Pain Score 10/08/17 2121 5     Pain Loc --      Pain Edu? --      Excl. in [place]? --     Constitutional: Alert and oriented, odd affect, diaphoretic.  HEENT:      Head: Normocephalic and atraumatic.         Eyes: Conjunctivae are normal. Sclera is non-icteric.       Mouth/Throat: Mucous membranes are moist.       Neck: Supple with no signs of meningismus. Cardiovascular: Regular rate and rhythm. No murmurs, gallops, or rubs. 2+ symmetrical distal pulses are present in all extremities. No JVD. Respiratory: Normal respiratory effort. Lungs are clear to auscultation bilaterally. No wheezes, crackles, or rhonchi.  Gastrointestinal: Soft, non tender, and non distended with positive bowel sounds. No rebound or guarding. Musculoskeletal: Nontender with normal range of motion in all extremities. No edema, cyanosis, or erythema of extremities. Neurologic: Normal  speech and language. A & O x3, PERRL, EOMI, no nystagmus, CN II-XII intact, motor testing reveals good tone and bulk throughout. There is no evidence of pronator drift or dysmetria. Muscle strength is 5/5 throughout.  Sensory examination is intact. Gait deferred. Skin: Skin is warm, dry and intact. No rash noted. Psychiatric: Mood and affect are blunt. Speech and behavior are normal.  ____________________________________________   LABS (all labs ordered are listed, but only abnormal results are displayed)  Labs Reviewed  BASIC METABOLIC PANEL - Abnormal; Notable for the following components:      Result Value   Chloride 99 (*)    Glucose, Bld 118 (*)    BUN 21 (*)    All other components within normal limits  TROPONIN I - Abnormal; Notable for the following components:   Troponin I 0.04 (*)    All other components within normal limits  CBC  TROPONIN I  URINALYSIS, COMPLETE (UACMP) WITH MICROSCOPIC   ____________________________________________  EKG  ED ECG REPORT I, Rudene Re, the attending physician, personally viewed and interpreted this ECG.  21:28 -normal sinus rhythm, rate of 82, normal intervals, left axis deviation, incomplete left bundle branch block, LVH, slight ST depressions on lateral leads with no ST elevation.  Unchanged from prior.  22:13 -normal sinus rhythm, rate of 74, incomplete left bundle branch block, LVH, persistent slight ST depressions with no ST elevation.  Unchanged from prior. ____________________________________________  RADIOLOGY  I have personally reviewed the images performed during this visit and I agree with the Radiologist's read.   Interpretation by Radiologist:  Dg Chest 2 View  Result Date: 10/08/2017 CLINICAL DATA:  Left-sided chest pressure EXAM: CHEST - 2 VIEW COMPARISON:  Chest radiograph 06/08/2017 FINDINGS: Calcific aortic atherosclerosis. Bibasilar atelectasis. Chronic midthoracic compression deformity and left proximal  humerus enchondroma. No focal airspace consolidation or pulmonary edema. No pleural effusion or pneumothorax. IMPRESSION: Bibasilar atelectasis without acute airspace disease. Electronically Signed   By: Ulyses Jarred M.D.   On: 10/08/2017 21:52   ____________________________________________   PROCEDURES  Procedure(s) performed: None Procedures Critical Care performed:  None ____________________________________________   INITIAL IMPRESSION / ASSESSMENT AND PLAN / ED COURSE   84 y.o. female with a history of hypertension, CAD, cardiomyopathy EF 30-35%, diabetes, hypertension, hyperlipidemia who presents for evaluation of elevated blood pressure, chest pain, shortness  of breath, and dizziness since this afternoon.  Seems like patient's blood pressure has been not well controlled for several weeks.  Upon arrival to the emergency room her blood pressure was 192/78, she is completely neurologically intact.  2 EKGs have been done which showed no changes from baseline.  Patient has a very odd affect which per daughter is her baseline.  She has not taken her evening Coreg and lisinopril.  She does look diaphoretic on exam and that is why second EKG was done.  I will give a sublingual nitro and her evening antihypertensive medications.  We will cycle cardiac enzymes.  We will get a chest x-ray.  Will get basic labs to rule out end organ damage.    _________________________ 10:53 PM on 10/08/2017 -----------------------------------------  Blood pressure is trending down.  After the nitro patient reports that her chest pain has fully resolved.  She is no longer diaphoretic.  Daughter is requesting urinalysis to rule out UTI as she is supposed to come in tomorrow to have that done.  We will send urine sample.  We will continue to monitor and repeat a second troponin at 1:30 AM.  _________________________ 12:10 AM on 10/09/2017 -----------------------------------------  Patient remains with no pain.   Blood pressure remains within normal range.  Plan is for a second troponin at 1:30 AM.  Care has been transferred to Dr. Beather Arbour  As part of my medical decision making, I reviewed the following data within the [place] History obtained from family, Nursing notes reviewed and incorporated, Labs reviewed , EKG interpreted , Old EKG reviewed, Old chart reviewed, Radiograph reviewed , Notes from prior ED visits and [place] Controlled Substance Database    Pertinent labs & imaging results that were available during my care of the patient were reviewed by me and considered in my medical decision making (see chart for details).    ____________________________________________   FINAL CLINICAL IMPRESSION(S) / ED DIAGNOSES  Final diagnoses:  Hypertensive urgency  Chest pain, unspecified type  Dizziness      NEW MEDICATIONS STARTED DURING THIS VISIT:  ED Discharge Orders    None       Note:  This document was prepared using Dragon voice recognition software and may include unintentional dictation errors.    Alfred Levins, [place], MD 10/09/17 [phone]

## 2017-10-09 DIAGNOSIS — I160 Hypertensive urgency: Secondary | ICD-10-CM | POA: Diagnosis not present

## 2017-10-09 LAB — URINALYSIS, COMPLETE (UACMP) WITH MICROSCOPIC
Bilirubin Urine: NEGATIVE
Glucose, UA: NEGATIVE mg/dL
Hgb urine dipstick: NEGATIVE
Ketones, ur: NEGATIVE mg/dL
Nitrite: NEGATIVE
PROTEIN: 100 mg/dL — AB
Specific Gravity, Urine: 1.012 (ref 1.005–1.030)
pH: 6 (ref 5.0–8.0)

## 2017-10-09 LAB — TROPONIN I: Troponin I: 0.04 ng/mL (ref <0.03)

## 2017-10-09 NOTE — ED Provider Notes (Signed)
-----------------------------------------   2:17 AM on 10/09/2017 -----------------------------------------  Repeat troponin remains stable.  Patient's blood pressure has improved.  Updated patient and family members.  Strict return precautions given.  All verbalize understanding and agree with plan of care.   Paulette Blanch, MD 10/09/17 [phone]

## 2017-10-09 NOTE — Discharge Instructions (Signed)

## 2017-10-10 ENCOUNTER — Other Ambulatory Visit: Payer: Self-pay

## 2017-10-10 ENCOUNTER — Emergency Department: Payer: Medicare Other

## 2017-10-10 ENCOUNTER — Emergency Department
Admission: EM | Admit: 2017-10-10 | Discharge: 2017-10-12 | Disposition: A | Discharge status: Home / Self Care | Payer: Medicare Other | Attending: Internal Medicine | Admitting: Internal Medicine

## 2017-10-10 ENCOUNTER — Encounter: Payer: Self-pay | Admitting: *Deleted

## 2017-10-10 DIAGNOSIS — K8500 Idiopathic acute pancreatitis without necrosis or infection: Secondary | ICD-10-CM | POA: Insufficient documentation

## 2017-10-10 DIAGNOSIS — E039 Hypothyroidism, unspecified: Secondary | ICD-10-CM | POA: Insufficient documentation

## 2017-10-10 DIAGNOSIS — Z87891 Personal history of nicotine dependence: Secondary | ICD-10-CM | POA: Insufficient documentation

## 2017-10-10 DIAGNOSIS — I119 Hypertensive heart disease without heart failure: Secondary | ICD-10-CM | POA: Diagnosis not present

## 2017-10-10 DIAGNOSIS — I10 Essential (primary) hypertension: Secondary | ICD-10-CM | POA: Diagnosis not present

## 2017-10-10 DIAGNOSIS — E119 Type 2 diabetes mellitus without complications: Secondary | ICD-10-CM | POA: Diagnosis not present

## 2017-10-10 DIAGNOSIS — R1013 Epigastric pain: Secondary | ICD-10-CM | POA: Diagnosis present

## 2017-10-10 LAB — URINALYSIS, COMPLETE (UACMP) WITH MICROSCOPIC
BACTERIA UA: NONE SEEN
BILIRUBIN URINE: NEGATIVE
Glucose, UA: NEGATIVE mg/dL
Hgb urine dipstick: NEGATIVE
KETONES UR: NEGATIVE mg/dL
Leukocytes, UA: NEGATIVE
NITRITE: NEGATIVE
Protein, ur: 30 mg/dL — AB
Specific Gravity, Urine: 1.005 (ref 1.005–1.030)
pH: 6 (ref 5.0–8.0)

## 2017-10-10 LAB — CBC WITH DIFFERENTIAL/PLATELET
BASOS ABS: 0 10*3/uL (ref 0–0.1)
BASOS PCT: 0 %
Eosinophils Absolute: 0.1 10*3/uL (ref 0–0.7)
Eosinophils Relative: 2 %
HEMATOCRIT: 40.4 % (ref 35.0–47.0)
Hemoglobin: 14 g/dL (ref 12.0–16.0)
Lymphocytes Relative: 28 %
Lymphs Abs: 1.4 10*3/uL (ref 1.0–3.6)
MCH: 31.4 pg (ref 26.0–34.0)
MCHC: 34.7 g/dL (ref 32.0–36.0)
MCV: 90.6 fL (ref 80.0–100.0)
MONO ABS: 0.4 10*3/uL (ref 0.2–0.9)
Monocytes Relative: 8 %
NEUTROS ABS: 3.1 10*3/uL (ref 1.4–6.5)
Neutrophils Relative %: 62 %
Platelets: 134 10*3/uL — ABNORMAL LOW (ref 150–440)
RBC: 4.46 MIL/uL (ref 3.80–5.20)
RDW: 13.8 % (ref 11.5–14.5)
WBC: 5.1 10*3/uL (ref 3.6–11.0)

## 2017-10-10 LAB — TROPONIN I: Troponin I: 0.04 ng/mL (ref <0.03)

## 2017-10-10 LAB — COMPREHENSIVE METABOLIC PANEL
ALBUMIN: 4 g/dL (ref 3.5–5.0)
ALT: 18 U/L (ref 14–54)
AST: 29 U/L (ref 15–41)
Alkaline Phosphatase: 50 U/L (ref 38–126)
Anion gap: 7 (ref 5–15)
BUN: 28 mg/dL — AB (ref 6–20)
CHLORIDE: 98 mmol/L — AB (ref 101–111)
CO2: 29 mmol/L (ref 22–32)
Calcium: 9.1 mg/dL (ref 8.9–10.3)
Creatinine, Ser: 0.94 mg/dL (ref 0.44–1.00)
GFR calc Af Amer: >60 mL/min (ref >60)
GFR calc non Af Amer: 55 mL/min — ABNORMAL LOW (ref >60)
Glucose, Bld: 119 mg/dL — ABNORMAL HIGH (ref 65–99)
POTASSIUM: 4 mmol/L (ref 3.5–5.1)
Sodium: 134 mmol/L — ABNORMAL LOW (ref 135–145)
Total Bilirubin: 0.7 mg/dL (ref 0.3–1.2)
Total Protein: 6.9 g/dL (ref 6.5–8.1)

## 2017-10-10 LAB — LIPASE, BLOOD: Lipase: 63 U/L — ABNORMAL HIGH (ref 11–51)

## 2017-10-10 NOTE — ED Notes (Signed)
Purewick placed on patient.

## 2017-10-10 NOTE — ED Notes (Signed)
Patient taken to Ultrasound

## 2017-10-10 NOTE — ED Notes (Signed)
md in to reassess/update pt.

## 2017-10-10 NOTE — ED Provider Notes (Signed)
[facility] Emergency Department Provider Note       Time seen: ----------------------------------------- 6:44 PM on 10/10/2017 ----------------------------------------- Level V caveat: History/ROS limited by altered mental status  I have reviewed the triage vital signs and the nursing notes.  HISTORY   Chief Complaint No chief complaint on file.    HPI Jacqueline [name] is a 84 y.o. female with a history of CHF, diabetes, cardiomyopathy, GERD, hyperlipidemia, hypertension who presents to the ED for epigastric pain.  Patient was reportedly just seen yesterday for chest pain with hypertension.  Patient felt like this was related to her eating.  Patient seemed to have altered mental status according to EMS.  Past Medical History:  Diagnosis Date  . CHF (congestive heart failure) ([place])   . Diabetes mellitus without complication ([place]) 93/23/5573   Currently no high BS.  Has come off meds.  But having episodes of low BS now.  . Dilated idiopathic cardiomyopathy ([place])   . GERD (gastroesophageal reflux disease)   . Hyperlipidemia   . Hypertension   . Hypothyroidism   . Renal disorder     Patient Active Problem List   Diagnosis Date Noted  . Hyponatremia 06/09/2017  . MI, acute, non ST segment elevation ([place]) 06/01/2017  . Acute upper back pain 03/05/2017  . Carcinoma of unknown primary ([place]) 03/05/2017  . Liver lesion 02/27/2017  . Diabetes ([place]) 02/24/2017  . Chronic systolic CHF (congestive heart failure) ([place]) 02/24/2017  . HTN (hypertension) 02/24/2017  . GERD (gastroesophageal reflux disease) 02/24/2017  . Hypothyroidism 02/24/2017  . Hypoglycemia 02/24/2017  . Dizziness 02/18/2017    Past Surgical History:  Procedure Laterality Date  . BUNIONECTOMY    . COLONOSCOPY    . KYPHOPLASTY N/A 06/12/2017   Procedure: [id number];  Surgeon: Hessie Knows, MD;  Location: [facility] ORS;  Service: Orthopedics;  Laterality: N/A;  . TONSILLECTOMY       Allergies Ciprofloxacin; Digoxin and related; Erythromycin; Lopid [gemfibrozil]; Nitrofurantoin; Cefuroxime; and Penicillins  Social History Social History   Tobacco Use  . Smoking status: Former Research scientist (life sciences)  . Smokeless tobacco: Never Used  Substance Use Topics  . Alcohol use: No  . Drug use: No   Review of Systems Constitutional: Negative for fever. Eyes: Negative for vision changes ENT:  Negative for congestion, sore throat Cardiovascular: Negative for chest pain. Respiratory: Negative for shortness of breath. Gastrointestinal: Negative for abdominal pain, vomiting and diarrhea. Genitourinary: Negative for dysuria. Musculoskeletal: Negative for back pain. Skin: Negative for rash. Neurological: Negative for headaches, focal weakness or numbness.  All systems negative/normal/unremarkable except as stated in the HPI  ____________________________________________   PHYSICAL EXAM:  VITAL SIGNS: ED Triage Vitals  Enc Vitals Group     BP      Pulse      Resp      Temp      Temp src      SpO2      Weight      Height      Head Circumference      Peak Flow      Pain Score      Pain Loc      Pain Edu?      Excl. in [employer]?    Constitutional: Alert, Well appearing and in no distress. Eyes: Conjunctivae are normal. Normal extraocular movements. ENT   Head: Normocephalic and atraumatic.   Nose: No congestion/rhinnorhea.   Mouth/Throat: Mucous membranes are moist.   Neck: No stridor. Cardiovascular: Normal rate, regular rhythm.  No murmurs, rubs, or gallops. Respiratory: Normal respiratory effort without tachypnea nor retractions. Breath sounds are clear and equal bilaterally. No wheezes/rales/rhonchi. Gastrointestinal: Soft and nontender. Normal bowel sounds Musculoskeletal: Nontender with normal range of motion in extremities. No lower extremity tenderness nor edema. Neurologic:  Normal speech and language. No gross focal neurologic deficits are appreciated.   Skin:  Skin is warm, dry and intact. No rash noted. Psychiatric: Mood and affect are normal. Speech and behavior are normal.  ____________________________________________  EKG: Interpreted by me.  Sinus rhythm rate 82 bpm, prolonged PR interval, incomplete left bundle branch block, LVH  ____________________________________________  ED COURSE:  As part of my medical decision making, I reviewed the following data within the [place] History obtained from family if available, nursing notes, old chart and ekg, as well as notes from prior ED visits. Patient presented for abdominal pain, we will assess with labs and imaging as indicated at this time.   Procedures ____________________________________________   LABS (pertinent positives/negatives)  Labs Reviewed  CBC WITH DIFFERENTIAL/PLATELET - Abnormal; Notable for the following components:      Result Value   Platelets 134 (*)    All other components within normal limits  COMPREHENSIVE METABOLIC PANEL - Abnormal; Notable for the following components:   Sodium 134 (*)    Chloride 98 (*)    Glucose, Bld 119 (*)    BUN 28 (*)    GFR calc non Af Amer 55 (*)    All other components within normal limits  LIPASE, BLOOD - Abnormal; Notable for the following components:   Lipase 63 (*)    All other components within normal limits  TROPONIN I - Abnormal; Notable for the following components:   Troponin I 0.04 (*)    All other components within normal limits  URINALYSIS, COMPLETE (UACMP) WITH MICROSCOPIC - Abnormal; Notable for the following components:   Color, Urine STRAW (*)    APPearance CLEAR (*)    Protein, ur 30 (*)    All other components within normal limits  URINE CULTURE    RADIOLOGY Images were viewed by me  IMPRESSION: No active pulmonary disease. Aortic atherosclerosis without aneurysm. Increased colonic stool burden consistent with constipation. Other chronic chest findings as  above. IMPRESSION: No acute findings.  Suggestion of hepatic steatosis without focal mass.  ____________________________________________  DIFFERENTIAL DIAGNOSIS   GERD, peptic ulcer disease, MI, gas pain, cholecystitis  FINAL ASSESSMENT AND PLAN  Pancreatitis  Plan: The patient had presented for abdominal pain. Patient's labs did reveal a chronically elevated troponin, mildly elevated lipase of uncertain etiology. Patient's imaging not reveal any acute process.  I think is likely the pancreatitis is the reason she is having epigastric pain and I think she is stable for outpatient follow-up with GI.   Laurence Aly, MD   Note: This note was generated in part or whole with voice recognition software. Voice recognition is usually quite accurate but there are transcription errors that can and very often do occur. I apologize for any typographical errors that were not detected and corrected.     Earleen Newport, MD 10/10/17 2251

## 2017-10-10 NOTE — ED Notes (Signed)
Dr. Jimmye Norman aware of Troponin of 0.04.

## 2017-10-10 NOTE — ED Triage Notes (Signed)
Per EMS report, patient c/o chest pressure and hypertension of 199/109. Patient was seen in the ED for same complaint yesterday.

## 2017-10-10 NOTE — ED Notes (Signed)
Report from sonja, rn.  

## 2017-10-12 LAB — URINE CULTURE
CULTURE: NO GROWTH
Special Requests: NORMAL

## 2017-10-15 ENCOUNTER — Encounter
Admission: RE | Disposition: A | Discharge status: Home / Self Care | Payer: Self-pay | Source: Ambulatory Visit | Attending: Gastroenterology

## 2017-10-15 ENCOUNTER — Encounter: Payer: Self-pay | Admitting: *Deleted

## 2017-10-15 ENCOUNTER — Ambulatory Visit
Admission: RE | Admit: 2017-10-15 | Discharge: 2017-10-15 | Disposition: A | Discharge status: Home / Self Care | Payer: Medicare Other | Source: Ambulatory Visit | Attending: Gastroenterology | Admitting: Gastroenterology

## 2017-10-15 ENCOUNTER — Ambulatory Visit: Payer: Medicare Other | Admitting: Anesthesiology

## 2017-10-15 DIAGNOSIS — E039 Hypothyroidism, unspecified: Secondary | ICD-10-CM | POA: Diagnosis not present

## 2017-10-15 DIAGNOSIS — K219 Gastro-esophageal reflux disease without esophagitis: Secondary | ICD-10-CM | POA: Insufficient documentation

## 2017-10-15 DIAGNOSIS — Z79899 Other long term (current) drug therapy: Secondary | ICD-10-CM | POA: Insufficient documentation

## 2017-10-15 DIAGNOSIS — K7460 Unspecified cirrhosis of liver: Secondary | ICD-10-CM | POA: Insufficient documentation

## 2017-10-15 DIAGNOSIS — R1310 Dysphagia, unspecified: Secondary | ICD-10-CM | POA: Diagnosis not present

## 2017-10-15 DIAGNOSIS — I130 Hypertensive heart and chronic kidney disease with heart failure and stage 1 through stage 4 chronic kidney disease, or unspecified chronic kidney disease: Secondary | ICD-10-CM | POA: Insufficient documentation

## 2017-10-15 DIAGNOSIS — Z7984 Long term (current) use of oral hypoglycemic drugs: Secondary | ICD-10-CM | POA: Diagnosis not present

## 2017-10-15 DIAGNOSIS — I8500 Esophageal varices without bleeding: Secondary | ICD-10-CM | POA: Diagnosis present

## 2017-10-15 DIAGNOSIS — N183 Chronic kidney disease, stage 3 (moderate): Secondary | ICD-10-CM | POA: Insufficient documentation

## 2017-10-15 DIAGNOSIS — E1122 Type 2 diabetes mellitus with diabetic chronic kidney disease: Secondary | ICD-10-CM | POA: Diagnosis not present

## 2017-10-15 DIAGNOSIS — Z7982 Long term (current) use of aspirin: Secondary | ICD-10-CM | POA: Diagnosis not present

## 2017-10-15 DIAGNOSIS — I509 Heart failure, unspecified: Secondary | ICD-10-CM | POA: Insufficient documentation

## 2017-10-15 DIAGNOSIS — Z87891 Personal history of nicotine dependence: Secondary | ICD-10-CM | POA: Diagnosis not present

## 2017-10-15 DIAGNOSIS — K2950 Unspecified chronic gastritis without bleeding: Secondary | ICD-10-CM | POA: Insufficient documentation

## 2017-10-15 DIAGNOSIS — I8510 Secondary esophageal varices without bleeding: Secondary | ICD-10-CM | POA: Insufficient documentation

## 2017-10-15 DIAGNOSIS — K449 Diaphragmatic hernia without obstruction or gangrene: Secondary | ICD-10-CM | POA: Insufficient documentation

## 2017-10-15 HISTORY — DX: Anemia, unspecified: D64.9

## 2017-10-15 HISTORY — DX: Age-related osteoporosis without current pathological fracture: M81.0

## 2017-10-15 HISTORY — PX: ESOPHAGOGASTRODUODENOSCOPY (EGD) WITH PROPOFOL: SHX5813

## 2017-10-15 LAB — GLUCOSE, CAPILLARY: GLUCOSE-CAPILLARY: 84 mg/dL (ref 65–99)

## 2017-10-15 SURGERY — ESOPHAGOGASTRODUODENOSCOPY (EGD) WITH PROPOFOL
Anesthesia: General

## 2017-10-15 MED ORDER — SODIUM CHLORIDE 0.9 % IV SOLN
INTRAVENOUS | Status: DC
  Administered 2017-10-15 (×2): via INTRAVENOUS

## 2017-10-15 MED ORDER — PHENYLEPHRINE HCL 10 MG/ML IJ SOLN
INTRAMUSCULAR | Status: DC | PRN
  Administered 2017-10-15 (×2): 100 ug via INTRAVENOUS
  Administered 2017-10-15: 50 ug via INTRAVENOUS

## 2017-10-15 MED ORDER — LIDOCAINE HCL (CARDIAC) PF 100 MG/5ML IV SOSY
PREFILLED_SYRINGE | INTRAVENOUS | Status: DC | PRN
  Administered 2017-10-15: 50 mg via INTRAVENOUS

## 2017-10-15 MED ORDER — PROPOFOL 500 MG/50ML IV EMUL
INTRAVENOUS | Status: DC | PRN
  Administered 2017-10-15: 60 ug/kg/min via INTRAVENOUS

## 2017-10-15 MED ORDER — PROPOFOL 500 MG/50ML IV EMUL
INTRAVENOUS | Status: AC
  Filled 2017-10-15: qty 50

## 2017-10-15 MED ORDER — PROPOFOL 10 MG/ML IV BOLUS
INTRAVENOUS | Status: AC
  Filled 2017-10-15: qty 20

## 2017-10-15 MED ORDER — PROPOFOL 10 MG/ML IV BOLUS
INTRAVENOUS | Status: DC | PRN
  Administered 2017-10-15: 10 mg via INTRAVENOUS
  Administered 2017-10-15: 30 mg via INTRAVENOUS

## 2017-10-15 NOTE — Anesthesia Post-op Follow-up Note (Signed)
Anesthesia QCDR form completed.        

## 2017-10-15 NOTE — Op Note (Signed)
[facility] Gastroenterology Patient Name: Jacqueline [name] Procedure Date: 10/15/2017 10:43 AM MRN: [id number] Account #: [id number] Date of Birth: 1933-05-03 Admit Type: Outpatient Age: 84 Room: [facility] ENDO ROOM 1 Gender: Female Note Status: Finalized Procedure:            Upper GI endoscopy Indications:          Screening procedure, Cirrhosis rule out esophageal                        varices Providers:            Lollie Sails, MD Referring MD:         Tracie Harrier, MD (Referring MD) Medicines:            Monitored Anesthesia Care Complications:        No immediate complications. Procedure:            Pre-Anesthesia Assessment:                       - ASA Grade Assessment: III - A patient with severe                        systemic disease.                       After obtaining informed consent, the endoscope was                        passed under direct vision. Throughout the procedure,                        the patient's blood pressure, pulse, and oxygen                        saturations were monitored continuously. The Endoscope                        was introduced through the mouth, and advanced to the                        third part of duodenum. The upper GI endoscopy was                        accomplished without difficulty. The patient tolerated                        the procedure. Findings:      A small hiatal hernia was present.      The Z-line was variable with minimal evidence of inflamation.      Grade I, small (< 5 mm) varices were found in the lower third of the       esophagus. They were 1 mm in largest diameter, noted relatively deep to       the lining.      Mild portal hypertensive gastropathy versus gastritis was found in the       upper gastric body. Biopsies were taken with a cold forceps for       histology from the antrum and body of the stomach for histology.      The cardia and gastric fundus were normal on retroflexion, no  evidence  of gastric varices.      The examined duodenum was normal. Impression:           - Small hiatal hernia.                       - Z-line variable.                       - Grade I and small (< 5 mm) esophageal varices.                       - Portal hypertensive gastropathy vs gastritis.                        Biopsied.                       - Normal examined duodenum. Recommendation:       - Continue present medications. Procedure Code(s):    --- Professional ---                       [phone], Esophagogastroduodenoscopy, flexible, transoral;                        with biopsy, single or multiple Diagnosis Code(s):    --- Professional ---                       K44.9, Diaphragmatic hernia without obstruction or                        gangrene                       K22.8, Other specified diseases of esophagus                       K74.60, Unspecified cirrhosis of liver                       I85.10, Secondary esophageal varices without bleeding                       K76.6, Portal hypertension                       K31.89, Other diseases of stomach and duodenum                       Z13.810, Encounter for screening for upper                        gastrointestinal disorder CPT copyright 2017 American Medical Association. All rights reserved. The codes documented in this report are preliminary and upon coder review may  be revised to meet current compliance requirements. Lollie Sails, MD 10/15/2017 11:19:42 AM This report has been signed electronically. Number of Addenda: 0 Note Initiated On: 10/15/2017 10:43 AM      [facility]

## 2017-10-15 NOTE — Anesthesia Postprocedure Evaluation (Signed)
Anesthesia Post Note  [name]  Procedure(s) Performed: ESOPHAGOGASTRODUODENOSCOPY (EGD) WITH PROPOFOL (N/A )  Patient location during evaluation: Endoscopy Anesthesia Type: General Level of consciousness: awake and alert Pain management: pain level controlled Vital Signs Assessment: post-procedure vital signs reviewed and stable Respiratory status: spontaneous breathing and respiratory function stable Cardiovascular status: stable Anesthetic complications: no     Last Vitals:  Vitals:   10/15/17 1126 10/15/17 1136  BP: 132/66 (!) 156/76  Pulse: 73 73  Resp: (!) 31 (!) 21  Temp:    SpO2: 95% 96%    Last Pain:  Vitals:   10/15/17 1126  TempSrc:   PainSc: 0-No pain                 KEPHART,WILLIAM K

## 2017-10-15 NOTE — Anesthesia Preprocedure Evaluation (Addendum)
Anesthesia Evaluation  Patient identified by MRN, date of birth, ID band Patient awake    Reviewed: Allergy & Precautions, NPO status , Patient's Chart, lab work & pertinent test results  History of Anesthesia Complications Negative for: history of anesthetic complications  Airway Mallampati: III       Dental  (+) Partial Upper   Pulmonary neg shortness of breath, neg sleep apnea, COPD, former smoker,           Cardiovascular hypertension, Pt. on medications + Past MI and +CHF  (-) dysrhythmias (-) Valvular Problems/Murmurs     Neuro/Psych neg Seizures    GI/Hepatic GERD  Medicated and Controlled,  Endo/Other  diabetes, Type 2, Oral Hypoglycemic AgentsHypothyroidism   Renal/GU Renal InsufficiencyRenal disease     Musculoskeletal   Abdominal   Peds  Hematology  (+) anemia ,   Anesthesia Other Findings   Reproductive/Obstetrics                            Anesthesia Physical Anesthesia Plan  ASA: III  Anesthesia Plan: General   Post-op Pain Management:    Induction:   PONV Risk Score and Plan: 3 and TIVA, Propofol infusion and Treatment may vary due to age or medical condition  Airway Management Planned: Simple Face Mask  Additional Equipment:   Intra-op Plan:   Post-operative Plan:   Informed Consent: I have reviewed the patients History and Physical, chart, labs and discussed the procedure including the risks, benefits and alternatives for the proposed anesthesia with the patient or authorized representative who has indicated his/her understanding and acceptance.     Plan Discussed with:   Anesthesia Plan Comments:         Anesthesia Quick Evaluation

## 2017-10-15 NOTE — H&P (Addendum)
Outpatient short stay form Pre-procedure 10/15/2017 10:43 AM Jacqueline Sails MD  Primary Physician: Dr Dion Body  Reason for visit: EGD  History of present illness: Jacqueline [name] is a 84 year old female presenting today for EGD.  Jacqueline [name] has a recent diagnosis of cirrhosis of the liver possibly due to hepatic steatosis.  Jacqueline [name] also has a history of some reflux and nausea with vomiting.  Jacqueline [name] gets some mild epigastric discomfort.  Jacqueline [name] has no dysphagia.  Jacqueline [name] has been placed on Protonix 40 mg daily.  Jacqueline [name] does take a 81 mg aspirin daily which was last taken yesterday.  Jacqueline [name] takes no other aspirin products or blood thinning agent.    Current Facility-Administered Medications:  .  0.9 %  sodium chloride infusion, , Intravenous, Continuous, Jacqueline Sails, MD, Last Rate: 20 mL/hr at 10/15/17 1016  Medications Prior to Admission  Medication Sig Dispense Refill Last Dose  . aspirin EC 81 MG tablet Take 81 mg by mouth daily.   10/14/2017 at Unknown time  . carvedilol (COREG) 12.5 MG tablet Take by mouth 2 (two) times daily with a meal.    10/14/2017 at Unknown time  . furosemide (LASIX) 20 MG tablet Take 1 tablet (20 mg total) by mouth daily. 30 tablet 0 10/14/2017 at Unknown time  . furosemide (LASIX) 40 MG tablet Take 20-40 mg by mouth 2 (two) times daily. 40 mg in the morning and 20 mg in the evening   10/14/2017 at Unknown time  . isosorbide mononitrate (IMDUR) 60 MG 24 hr tablet Take 0.5 tablets (30 mg total) by mouth daily. 30 tablet 1 10/14/2017 at Unknown time  . KLOR-CON M10 10 MEQ tablet Take 10 mEq by mouth daily.   10/14/2017 at Unknown time  . levothyroxine (SYNTHROID, LEVOTHROID) 25 MCG tablet Take 25 mcg by mouth daily before breakfast.   10/14/2017 at Unknown time  . lisinopril (PRINIVIL,ZESTRIL) 20 MG tablet Take 20 mg by mouth 2 (two) times daily.   10/14/2017 at Unknown time  . pantoprazole (PROTONIX) 40 MG tablet Take 40 mg by mouth daily.   10/14/2017 at Unknown time  . vitamin B-12 (CYANOCOBALAMIN)  500 MCG tablet Take 500 mcg by mouth 2 (two) times daily.   Past Week at Unknown time  . alendronate (FOSAMAX) 70 MG tablet Take 70 mg by mouth every 7 (seven) days.   Past Week at Unknown time  . atorvastatin (LIPITOR) 40 MG tablet Take 2 tablets (80 mg total) by mouth daily at 6 PM. (Jacqueline [name] not taking: Reported on 10/08/2017) 30 tablet 1 Not Taking at Unknown time  . docusate sodium (COLACE) 100 MG capsule Take 1 capsule (100 mg total) by mouth 2 (two) times daily. 10 capsule 0 10/10/2017 at am  . ergocalciferol (VITAMIN D2) 50000 units capsule Take 50,000 Units by mouth once a week.   Past Week at Unknown time  . fenofibrate (TRICOR) 145 MG tablet Take 145 mg by mouth daily.   10/10/2017 at Unknown time  . glucose blood (FREESTYLE LITE) test strip USE TO CHECK BLOOD SUGARS TWICE A DAY   Taking  . HYDROcodone-acetaminophen (NORCO/VICODIN) 5-325 MG tablet Take 1-2 tablets by mouth every 4 (four) hours as needed for moderate pain. (Jacqueline [name] not taking: Reported on 10/08/2017) 30 tablet 0 Not Taking at Unknown time  . Iron-FA-B Cmp-C-Biot-Probiotic (FUSION PLUS) CAPS Take 1 capsule by mouth daily.    10/10/2017 at Unknown time  . meclizine (ANTIVERT) 25 MG tablet Take 12.5 mg by mouth 2 (two) times daily.  10/10/2017 at am  . ramipril (ALTACE) 10 MG capsule Take 20 mg by mouth daily.    Not Taking at Unknown time  . sertraline (ZOLOFT) 50 MG tablet Take 50 mg by mouth daily.   10/10/2017 at Unknown time  . traMADol (ULTRAM) 50 MG tablet Take 1 tablet (50 mg total) by mouth every 12 (twelve) hours as needed for moderate pain. (Jacqueline [name] not taking: Reported on 10/08/2017) 20 tablet 0 Not Taking at Unknown time     Allergies  Allergen Reactions  . Ciprofloxacin   . Digoxin And Related   . Erythromycin   . Lopid [Gemfibrozil]   . Nitrofurantoin   . Cefuroxime Rash  . Penicillins Rash    Has Jacqueline [name] had a PCN reaction causing immediate rash, facial/tongue/throat swelling, SOB or lightheadedness with  hypotension: Unknown Has Jacqueline [name] had a PCN reaction causing severe rash involving mucus membranes or skin necrosis: No Has Jacqueline [name] had a PCN reaction that required hospitalization: No Has Jacqueline [name] had a PCN reaction occurring within the last 10 years: No If all of the above answers are "NO", then may proceed with Cephalosporin use.      Past Medical History:  Diagnosis Date  . Anemia   . CHF (congestive heart failure) ([place])   . Diabetes mellitus without complication ([place]) 16/03/9603   Currently no high BS.  Has come off meds.  But having episodes of low BS now.  . Dilated idiopathic cardiomyopathy ([place])   . GERD (gastroesophageal reflux disease)   . Hyperlipidemia   . Hypertension   . Hypothyroidism   . Osteoporosis   . Renal disorder    stage 3    Review of systems:      Physical Exam    Heart and lungs: Regular rate and rhythm without rub or gallop, lungs are bilaterally clear.    HEENT: Normocephalic atraumatic eyes are anicteric    Other:    Pertinant exam for procedure: Soft mild discomfort in the epigastric region, nondistended, no rebound, bowel sounds are positive normoactive.  No masses noted.    Planned proceedures: EGD and indicated procedures. I have discussed the risks benefits and complications of procedures to include not limited to bleeding, infection, perforation and the risk of sedation and the Jacqueline [name] wishes to proceed.    Jacqueline Sails, MD Gastroenterology 10/15/2017  10:43 AM

## 2017-10-15 NOTE — Transfer of Care (Signed)
Immediate Anesthesia Transfer of Care Note  [name]  Procedure(s) Performed: ESOPHAGOGASTRODUODENOSCOPY (EGD) WITH PROPOFOL (N/A )  Patient Location: PACU  Anesthesia Type:MAC  Level of Consciousness: awake, alert  and oriented  Airway & Oxygen Therapy: Patient Spontanous Breathing and Patient connected to nasal cannula oxygen  Post-op Assessment: Report given to RN and Post -op Vital signs reviewed and stable  Post vital signs: stable  Last Vitals:  Vitals Value Taken Time  BP 127/59 10/15/2017 11:16 AM  Temp 36.1 C 10/15/2017 11:16 AM  Pulse 70 10/15/2017 11:20 AM  Resp 16 10/15/2017 11:20 AM  SpO2 95 % 10/15/2017 11:20 AM  Vitals shown include unvalidated device data.  Last Pain:  Vitals:   10/15/17 1116  TempSrc: Tympanic  PainSc: 0-No pain      Patients Stated Pain Goal: 6 (87/56/43 [id number])  Complications: No apparent anesthesia complications

## 2017-10-16 ENCOUNTER — Encounter: Payer: Self-pay | Admitting: Gastroenterology

## 2017-10-16 LAB — SURGICAL PATHOLOGY

## 2018-01-04 ENCOUNTER — Inpatient Hospital Stay: Payer: No Typology Code available for payment source | Admitting: Oncology

## 2018-01-04 ENCOUNTER — Inpatient Hospital Stay: Payer: No Typology Code available for payment source | Attending: Oncology

## 2018-01-07 ENCOUNTER — Telehealth: Payer: Self-pay | Admitting: Internal Medicine

## 2018-01-07 NOTE — Telephone Encounter (Signed)
Left pt Vm to contact office to get missed appt on 7/22 r\s per scheduling message from Renita Papa.

## 2018-01-14 ENCOUNTER — Inpatient Hospital Stay: Payer: Medicare Other | Attending: Oncology

## 2018-01-14 ENCOUNTER — Inpatient Hospital Stay (HOSPITAL_BASED_OUTPATIENT_CLINIC_OR_DEPARTMENT_OTHER): Payer: Medicare Other | Admitting: Oncology

## 2018-01-14 ENCOUNTER — Encounter: Payer: Self-pay | Admitting: Oncology

## 2018-01-14 VITALS — BP 113/64 | HR 61 | Temp 97.6°F | Resp 18 | Wt 125.6 lb

## 2018-01-14 DIAGNOSIS — Z7982 Long term (current) use of aspirin: Secondary | ICD-10-CM

## 2018-01-14 DIAGNOSIS — G8929 Other chronic pain: Secondary | ICD-10-CM

## 2018-01-14 DIAGNOSIS — E11649 Type 2 diabetes mellitus with hypoglycemia without coma: Secondary | ICD-10-CM | POA: Diagnosis not present

## 2018-01-14 DIAGNOSIS — I10 Essential (primary) hypertension: Secondary | ICD-10-CM

## 2018-01-14 DIAGNOSIS — Z87891 Personal history of nicotine dependence: Secondary | ICD-10-CM | POA: Diagnosis not present

## 2018-01-14 DIAGNOSIS — K7460 Unspecified cirrhosis of liver: Secondary | ICD-10-CM

## 2018-01-14 DIAGNOSIS — R300 Dysuria: Secondary | ICD-10-CM | POA: Diagnosis not present

## 2018-01-14 DIAGNOSIS — K760 Fatty (change of) liver, not elsewhere classified: Secondary | ICD-10-CM

## 2018-01-14 DIAGNOSIS — K769 Liver disease, unspecified: Secondary | ICD-10-CM

## 2018-01-14 DIAGNOSIS — C801 Malignant (primary) neoplasm, unspecified: Secondary | ICD-10-CM | POA: Insufficient documentation

## 2018-01-14 DIAGNOSIS — Z79899 Other long term (current) drug therapy: Secondary | ICD-10-CM | POA: Diagnosis not present

## 2018-01-14 DIAGNOSIS — D696 Thrombocytopenia, unspecified: Secondary | ICD-10-CM | POA: Diagnosis not present

## 2018-01-14 LAB — CBC WITH DIFFERENTIAL/PLATELET
Basophils Absolute: 0 10*3/uL (ref 0–0.1)
Basophils Relative: 1 %
Eosinophils Absolute: 0.1 10*3/uL (ref 0–0.7)
Eosinophils Relative: 2 %
HEMATOCRIT: 43.5 % (ref 35.0–47.0)
HEMOGLOBIN: 14.9 g/dL (ref 12.0–16.0)
LYMPHS PCT: 27 %
Lymphs Abs: 1.2 10*3/uL (ref 1.0–3.6)
MCH: 31.8 pg (ref 26.0–34.0)
MCHC: 34.2 g/dL (ref 32.0–36.0)
MCV: 92.9 fL (ref 80.0–100.0)
MONO ABS: 0.5 10*3/uL (ref 0.2–0.9)
MONOS PCT: 10 %
NEUTROS ABS: 2.6 10*3/uL (ref 1.4–6.5)
NEUTROS PCT: 60 %
Platelets: 163 10*3/uL (ref 150–440)
RBC: 4.68 MIL/uL (ref 3.80–5.20)
RDW: 13.9 % (ref 11.5–14.5)
WBC: 4.4 10*3/uL (ref 3.6–11.0)

## 2018-01-14 LAB — URINALYSIS, COMPLETE (UACMP) WITH MICROSCOPIC
BACTERIA UA: NONE SEEN
BILIRUBIN URINE: NEGATIVE
GLUCOSE, UA: NEGATIVE mg/dL
HGB URINE DIPSTICK: NEGATIVE
KETONES UR: NEGATIVE mg/dL
LEUKOCYTES UA: NEGATIVE
Nitrite: NEGATIVE
PROTEIN: 100 mg/dL — AB
Specific Gravity, Urine: 1.012 (ref 1.005–1.030)
pH: 5 (ref 5.0–8.0)

## 2018-01-14 LAB — COMPREHENSIVE METABOLIC PANEL
ALBUMIN: 4.1 g/dL (ref 3.5–5.0)
ALK PHOS: 54 U/L (ref 38–126)
ALT: 21 U/L (ref 0–44)
AST: 31 U/L (ref 15–41)
Anion gap: 9 (ref 5–15)
BUN: 31 mg/dL — ABNORMAL HIGH (ref 8–23)
CO2: 25 mmol/L (ref 22–32)
Calcium: 9.5 mg/dL (ref 8.9–10.3)
Chloride: 100 mmol/L (ref 98–111)
Creatinine, Ser: 1.05 mg/dL — ABNORMAL HIGH (ref 0.44–1.00)
GFR calc Af Amer: 55 mL/min — ABNORMAL LOW (ref >60)
GFR calc non Af Amer: 48 mL/min — ABNORMAL LOW (ref >60)
GLUCOSE: 106 mg/dL — AB (ref 70–99)
Potassium: 4.6 mmol/L (ref 3.5–5.1)
SODIUM: 134 mmol/L — AB (ref 135–145)
Total Bilirubin: 0.8 mg/dL (ref 0.3–1.2)
Total Protein: 7.2 g/dL (ref 6.5–8.1)

## 2018-01-14 NOTE — Progress Notes (Signed)
[facility] CONSULT NOTE  Patient Care Team: Tracie Harrier, MD as PCP - General (Internal Medicine) Clent Jacks, RN as Registered Nurse  CHIEF COMPLAINTS/PURPOSE OF CONSULTATION: Liver lesions/hypoglycemia  # sep 2018- Liver lesions/cirrhotic liver [AFp- 16]  # Hypoglycemia [sep 1610]; resolved   # EF- 20% [Dr.Kowalski- cath/ no blocks]  #   No history exists.     HISTORY OF PRESENTING ILLNESS: Patient speaks minimal English; interpreter/daughter.   Patient returns to clinic today for follow-up of incidental findings of liver lesions and elevated AFP.  Daughter states patient has been doing well.  Over the course of the past week has noted creased urinary frequency, urgency and dysuria.  She also notes lower and upper back pain.  Has history of recurrent UTIs.  Ortho has been following her upper back pain s/p kyphoplasty.  She maintains a good appetite.  Notes a elevated blood pressure in the evenings.  Several different occasions prescribed by PCP Dr. Ginette Pitman.    Review of Systems  Constitutional: Positive for malaise/fatigue. Negative for chills, fever and weight loss.  HENT: Negative for congestion and ear pain.   Eyes: Negative.  Negative for blurred vision and double vision.  Respiratory: Negative.  Negative for cough, sputum production and shortness of breath.   Cardiovascular: Negative.  Negative for chest pain, palpitations and leg swelling.  Gastrointestinal: Negative.  Negative for abdominal pain, constipation, diarrhea, nausea and vomiting.  Genitourinary: Positive for dysuria, flank pain, frequency and urgency.  Musculoskeletal: Positive for back pain. Negative for falls.  Skin: Negative.  Negative for rash.  Neurological: Positive for weakness. Negative for headaches.  Endo/Heme/Allergies: Negative.  Does not bruise/bleed easily.  Psychiatric/Behavioral: Negative.  Negative for depression. The patient is not nervous/anxious and does not have  insomnia.     MEDICAL HISTORY:  Past Medical History:  Diagnosis Date  . Anemia   . CHF (congestive heart failure) ([place])   . Diabetes mellitus without complication ([place]) 96/09/5407   Currently no high BS.  Has come off meds.  But having episodes of low BS now.  . Dilated idiopathic cardiomyopathy ([place])   . GERD (gastroesophageal reflux disease)   . Hyperlipidemia   . Hypertension   . Hypothyroidism   . Osteoporosis   . Renal disorder    stage 3    SURGICAL HISTORY: Past Surgical History:  Procedure Laterality Date  . BUNIONECTOMY    . COLONOSCOPY    . ESOPHAGOGASTRODUODENOSCOPY (EGD) WITH PROPOFOL N/A 10/15/2017   Procedure: ESOPHAGOGASTRODUODENOSCOPY (EGD) WITH PROPOFOL;  Surgeon: Lollie Sails, MD;  Location: [facility] ENDOSCOPY;  Service: Endoscopy;  Laterality: N/A;  . KYPHOPLASTY N/A 06/12/2017   Procedure: [id number];  Surgeon: Hessie Knows, MD;  Location: [facility] ORS;  Service: Orthopedics;  Laterality: N/A;  . TONSILLECTOMY      SOCIAL HISTORY: Social History   Socioeconomic History  . Marital status: Widowed    Spouse name: Not on file  . Number of children: Not on file  . Years of education: Not on file  . Highest education level: Not on file  Occupational History  . Not on file  Social Needs  . Financial resource strain: Not on file  . Food insecurity:    Worry: Not on file    Inability: Not on file  . Transportation needs:    Medical: Not on file    Non-medical: Not on file  Tobacco Use  . Smoking status: Former Smoker    Last attempt to quit: 04/18/1984  Years since quitting: 33.7  . Smokeless tobacco: Never Used  Substance and Sexual Activity  . Alcohol use: No  . Drug use: No  . Sexual activity: Never  Lifestyle  . Physical activity:    Days per week: Not on file    Minutes per session: Not on file  . Stress: Not on file  Relationships  . Social connections:    Talks on phone: Not on file    Gets together: Not on file    Attends  religious service: Not on file    Active member of club or organization: Not on file    Attends meetings of clubs or organizations: Not on file    Relationship status: Not on file  . Intimate partner violence:    Fear of current or ex partner: Not on file    Emotionally abused: Not on file    Physically abused: Not on file    Forced sexual activity: Not on file  Other Topics Concern  . Not on file  Social History Narrative  . Not on file    FAMILY HISTORY: Family History  Problem Relation Age of Onset  . Premature CHD Brother     ALLERGIES:  is allergic to ciprofloxacin; digoxin and related; erythromycin; lopid [gemfibrozil]; nitrofurantoin; cefuroxime; and penicillins.  MEDICATIONS:  Current Outpatient Medications  Medication Sig Dispense Refill  . alendronate (FOSAMAX) 70 MG tablet Take 70 mg by mouth every 7 (seven) days.    Marland Kitchen aspirin EC 81 MG tablet Take 81 mg by mouth daily.    . carvedilol (COREG) 12.5 MG tablet Take by mouth 2 (two) times daily with a meal.     . ergocalciferol (VITAMIN D2) 50000 units capsule Take 50,000 Units by mouth once a week.    . furosemide (LASIX) 40 MG tablet Take 20-40 mg by mouth 2 (two) times daily. 40 mg in the morning and 20 mg in the evening    . glucose blood (FREESTYLE LITE) test strip USE TO CHECK BLOOD SUGARS TWICE A DAY    . iron polysaccharides (NIFEREX) 150 MG capsule Take 150 mg by mouth daily.    . isosorbide mononitrate (IMDUR) 60 MG 24 hr tablet Take 0.5 tablets (30 mg total) by mouth daily. 30 tablet 1  . KLOR-CON M10 10 MEQ tablet Take 10 mEq by mouth daily.    Marland Kitchen levothyroxine (SYNTHROID, LEVOTHROID) 25 MCG tablet Take 25 mcg by mouth daily before breakfast.    . lisinopril (PRINIVIL,ZESTRIL) 20 MG tablet Take 20 mg by mouth 2 (two) times daily.    . pantoprazole (PROTONIX) 40 MG tablet Take 40 mg by mouth daily.    . sertraline (ZOLOFT) 50 MG tablet Take 50 mg by mouth daily.     No current facility-administered medications  for this visit.       Marland Kitchen  PHYSICAL EXAMINATION: ECOG PERFORMANCE STATUS: 0 - Asymptomatic  Vitals:   01/14/18 1123  BP: 113/64  Pulse: 61  Resp: 18  Temp: 97.6 F (36.4 C)   Filed Weights   01/14/18 1123  Weight: 125 lb 9.6 oz (57 kg)    Physical Exam  Constitutional: She is oriented to person, place, and time and well-developed, well-nourished, and in no distress. Vital signs are normal.  HENT:  Head: Normocephalic and atraumatic.  Eyes: Pupils are equal, round, and reactive to light.  Neck: Normal range of motion.  Cardiovascular: Normal rate, regular rhythm and normal heart sounds.  No murmur heard. Pulmonary/Chest: Effort normal  and breath sounds normal. She has no wheezes.  Abdominal: Soft. Normal appearance and bowel sounds are normal. She exhibits no distension. There is no tenderness.  Genitourinary:  Genitourinary Comments: We will collect a urine sample given  Musculoskeletal: Normal range of motion. She exhibits no edema.  Neurological: She is alert and oriented to person, place, and time. Gait normal.  Skin: Skin is warm and dry. No rash noted.  Psychiatric: Mood, memory, affect and judgment normal.    LABORATORY DATA:  I have reviewed the data as listed Lab Results  Component Value Date   WBC 4.4 01/14/2018   HGB 14.9 01/14/2018   HCT 43.5 01/14/2018   MCV 92.9 01/14/2018   PLT 163 01/14/2018   Recent Labs    06/08/17 2127  06/22/17 1125 10/08/17 2126 10/10/17 1933 01/14/18 1049  NA 127*   < > 133* 136 134* 134*  K 4.9   < > 4.6 3.9 4.0 4.6  CL 92*   < > 100* 99* 98* 100  CO2 27   < > 24 29 29 25   GLUCOSE 137*   < > 120* 118* 119* 106*  BUN 39*   < > 30* 21* 28* 31*  CREATININE 2.16*   < > 0.99 0.82 0.94 1.05*  CALCIUM 9.4   < > 9.1 9.0 9.1 9.5  GFRNONAA 20*   < > 52* >60 55* 48*  GFRAA 23*   < > 60* >60 >60 55*  PROT 8.0  --  6.9  --  6.9 7.2  ALBUMIN 4.1  --  3.8  --  4.0 4.1  AST 40  --  41  --  29 31  ALT 19  --  19  --  18 21   ALKPHOS 45  --  51  --  50 54  BILITOT 0.9  --  0.8  --  0.7 0.8  BILIDIR 0.2  --   --   --   --   --   IBILI 0.7  --   --   --   --   --    < > = values in this interval not displayed.    RADIOGRAPHIC STUDIES: I have personally reviewed the radiological images as listed and agreed with the findings in the report. No results found.  ASSESSMENT & PLAN:   Carcinoma of unknown primary ([place]) # Liver lesions in a cirrhotic liver. AFP elevated initally at 16;? malignancy versus benign lesions. Had CT liver Sept 2018 revealing 2 nonspecific liver lesions on a background of cirrhotic Change. Had dedicated RUQ xray revealing for epigastric pain that revealed fatty liver. No further complaints from patient. AFP today is 1.7.    #Given the unclear nature of the lesions on CT scans recommend holding off any further workup at this time. PET- no metabolically active lesions noted; ideally MRI would be recommended-however MRI cannot be done because of tolerance issues.   #Back pain status post kyphoplasty improved; however continues to have chronic back pain.  Defer to orthopedics.  # cirrhosis- ? Etiology. Hep work up neg;? Fatty liver vs. Others. Had GI workup including EGD which revealed a small hiatal hernia, Grade I and small (< 5 mm) esophageal varices, portal hypertensive gastropathy vs gastritis. Biopsied (negative) and a normally examined duodenum.  #Mild thrombocytopenia-intermittent.  Stable today. Plt count 163,000.   #Hypoglycemia-improved after stopping antidiabetic medications.  No concerns for insulinoma.  Status post evaluation at [facility] clinic.  # UTI?  We will  collect sample.  Urinalysis and Urine culture.   # Hypertension: Patient concerned about elevated blood pressures in the evening.  Asked her to keep a blood pressure diary.  Continue her medications as prescribed by Dr. Ginette Pitman.  Today's blood pressure in clinic is within normal range.  States at night she gets readings as  high as 548 systolic.  We will send this note to PCP.  # follow up in 6 months/labs-AFP.    CC: Dr. Ginette Pitman  Greater than 50% was spent in counseling and coordination of care with this patient including but not limited to discussion of the relevant topics above (See A&P) including, but not limited to diagnosis and management of acute and chronic medical conditions.   All questions were answered. The patient knows to call the clinic with any problems, questions or concerns.    Jacquelin Hawking, NP 01/15/2018 12:28 PM

## 2018-01-14 NOTE — Assessment & Plan Note (Addendum)
#   Liver lesions in a cirrhotic liver. AFP elevated initally at 16;? malignancy versus benign lesions. Had CT liver Sept 2018 revealing 2 nonspecific liver lesions on a background of cirrhotic Change. Had dedicated RUQ xray revealing for epigastric pain that revealed fatty liver. No further complaints from patient. AFP today is 1.7.    #Given the unclear nature of the lesions on CT scans recommend holding off any further workup at this time. PET- no metabolically active lesions noted; ideally MRI would be recommended-however MRI cannot be done because of tolerance issues.   #Back pain status post kyphoplasty improved; however continues to have chronic back pain.  Defer to orthopedics.  # cirrhosis- ? Etiology. Hep work up neg;? Fatty liver vs. Others. Had GI workup including EGD which revealed a small hiatal hernia, Grade I and small (< 5 mm) esophageal varices, portal hypertensive gastropathy vs gastritis. Biopsied (negative) and a normally examined duodenum.  #Mild thrombocytopenia-intermittent.  Stable today. Plt count 163,000.   #Hypoglycemia-improved after stopping antidiabetic medications.  No concerns for insulinoma.  Status post evaluation at [facility] clinic.  # UTI?  We will collect sample.  Urinalysis and Urine culture.   # Hypertension: Patient concerned about elevated blood pressures in the evening.  Asked her to keep a blood pressure diary.  Continue her medications as prescribed by Dr. Ginette Pitman.  Today's blood pressure in clinic is within normal range.  States at night she gets readings as high as 956 systolic.  We will send this note to PCP.  # follow up in 6 months/labs-AFP.    CC: Dr. Ginette Pitman

## 2018-01-15 LAB — AFP TUMOR MARKER: AFP, Serum, Tumor Marker: 1.7 ng/mL (ref 0.0–8.3)

## 2018-01-15 LAB — URINE CULTURE: Culture: NO GROWTH

## 2018-07-01 DIAGNOSIS — R809 Proteinuria, unspecified: Secondary | ICD-10-CM | POA: Insufficient documentation

## 2018-07-01 DIAGNOSIS — D696 Thrombocytopenia, unspecified: Secondary | ICD-10-CM | POA: Insufficient documentation

## 2018-07-01 DIAGNOSIS — J9611 Chronic respiratory failure with hypoxia: Secondary | ICD-10-CM

## 2018-07-19 ENCOUNTER — Ambulatory Visit: Payer: Medicare Other | Admitting: Internal Medicine

## 2018-07-19 ENCOUNTER — Other Ambulatory Visit: Payer: Medicare Other

## 2018-08-03 ENCOUNTER — Other Ambulatory Visit: Payer: Self-pay | Admitting: *Deleted

## 2018-08-03 DIAGNOSIS — C801 Malignant (primary) neoplasm, unspecified: Secondary | ICD-10-CM

## 2018-08-04 ENCOUNTER — Inpatient Hospital Stay: Payer: Medicare Other | Attending: Internal Medicine

## 2018-08-04 ENCOUNTER — Inpatient Hospital Stay (HOSPITAL_BASED_OUTPATIENT_CLINIC_OR_DEPARTMENT_OTHER): Payer: Medicare Other | Admitting: Internal Medicine

## 2018-08-04 ENCOUNTER — Other Ambulatory Visit: Payer: Self-pay

## 2018-08-04 VITALS — BP 115/67 | HR 62 | Temp 97.8°F | Resp 20 | Ht <= 58 in | Wt 135.0 lb

## 2018-08-04 DIAGNOSIS — R911 Solitary pulmonary nodule: Secondary | ICD-10-CM

## 2018-08-04 DIAGNOSIS — D696 Thrombocytopenia, unspecified: Secondary | ICD-10-CM | POA: Diagnosis not present

## 2018-08-04 DIAGNOSIS — Z7982 Long term (current) use of aspirin: Secondary | ICD-10-CM

## 2018-08-04 DIAGNOSIS — Z87891 Personal history of nicotine dependence: Secondary | ICD-10-CM | POA: Insufficient documentation

## 2018-08-04 DIAGNOSIS — G8929 Other chronic pain: Secondary | ICD-10-CM | POA: Insufficient documentation

## 2018-08-04 DIAGNOSIS — R5382 Chronic fatigue, unspecified: Secondary | ICD-10-CM

## 2018-08-04 DIAGNOSIS — K769 Liver disease, unspecified: Secondary | ICD-10-CM | POA: Insufficient documentation

## 2018-08-04 DIAGNOSIS — Z79899 Other long term (current) drug therapy: Secondary | ICD-10-CM

## 2018-08-04 DIAGNOSIS — M549 Dorsalgia, unspecified: Secondary | ICD-10-CM

## 2018-08-04 DIAGNOSIS — K7460 Unspecified cirrhosis of liver: Secondary | ICD-10-CM | POA: Insufficient documentation

## 2018-08-04 DIAGNOSIS — E785 Hyperlipidemia, unspecified: Secondary | ICD-10-CM

## 2018-08-04 DIAGNOSIS — C801 Malignant (primary) neoplasm, unspecified: Secondary | ICD-10-CM

## 2018-08-04 LAB — CBC WITH DIFFERENTIAL/PLATELET
ABS IMMATURE GRANULOCYTES: 0.01 10*3/uL (ref 0.00–0.07)
BASOS ABS: 0 10*3/uL (ref 0.0–0.1)
BASOS PCT: 0 %
Eosinophils Absolute: 0.2 10*3/uL (ref 0.0–0.5)
Eosinophils Relative: 4 %
HCT: 40.4 % (ref 36.0–46.0)
Hemoglobin: 13.6 g/dL (ref 12.0–15.0)
Immature Granulocytes: 0 %
Lymphocytes Relative: 23 %
Lymphs Abs: 1.1 10*3/uL (ref 0.7–4.0)
MCH: 31 pg (ref 26.0–34.0)
MCHC: 33.7 g/dL (ref 30.0–36.0)
MCV: 92 fL (ref 80.0–100.0)
MONO ABS: 0.5 10*3/uL (ref 0.1–1.0)
Monocytes Relative: 10 %
NEUTROS ABS: 3 10*3/uL (ref 1.7–7.7)
NEUTROS PCT: 63 %
NRBC: 0 % (ref 0.0–0.2)
PLATELETS: 134 10*3/uL — AB (ref 150–400)
RBC: 4.39 MIL/uL (ref 3.87–5.11)
RDW: 13.1 % (ref 11.5–15.5)
WBC: 4.7 10*3/uL (ref 4.0–10.5)

## 2018-08-04 LAB — COMPREHENSIVE METABOLIC PANEL
ALT: 19 U/L (ref 0–44)
ANION GAP: 9 (ref 5–15)
AST: 25 U/L (ref 15–41)
Albumin: 4.2 g/dL (ref 3.5–5.0)
Alkaline Phosphatase: 42 U/L (ref 38–126)
BILIRUBIN TOTAL: 0.8 mg/dL (ref 0.3–1.2)
BUN: 34 mg/dL — AB (ref 8–23)
CHLORIDE: 104 mmol/L (ref 98–111)
CO2: 28 mmol/L (ref 22–32)
Calcium: 9.2 mg/dL (ref 8.9–10.3)
Creatinine, Ser: 0.96 mg/dL (ref 0.44–1.00)
GFR, EST NON AFRICAN AMERICAN: 55 mL/min — AB (ref >60)
Glucose, Bld: 109 mg/dL — ABNORMAL HIGH (ref 70–99)
POTASSIUM: 4 mmol/L (ref 3.5–5.1)
Sodium: 141 mmol/L (ref 135–145)
TOTAL PROTEIN: 7.3 g/dL (ref 6.5–8.1)

## 2018-08-04 NOTE — Progress Notes (Signed)
[place] NOTE  Patient Care Team: Tracie Harrier, MD as PCP - General (Internal Medicine) Clent Jacks, RN as Registered Nurse  CHIEF COMPLAINTS/PURPOSE OF CONSULTATION: Liver lesions/hypoglycemia  # sep 2018- Liver lesions/cirrhotic liver [AFp- 16]; MRI liver-poor quality  # Hypoglycemia [sep 1761]; resolved   # EF- 20% [Dr.Kowalski- cath/ no blocks]  #   No history exists.     HISTORY OF PRESENTING ILLNESS: Patient speaks minimal English; interpreter/daughter.   Jacqueline [name] 85 y.o.  female history of cirrhosis unclear etiology currently compensated is here for follow-up of her liver lesion of unclear etiology.  Patient has not had any recent hospitalizations.  Clinically stable.  No abdominal pain no nausea vomiting no swelling in the legs.  Chronic back pain which is stable.  No new shortness of breath or cough.  Chronic mild fatigue.  She is currently wheelchair.  Review of Systems  Constitutional: Positive for malaise/fatigue. Negative for chills, diaphoresis, fever and weight loss.  HENT: Negative for nosebleeds and sore throat.   Eyes: Negative for double vision.  Respiratory: Negative for cough, hemoptysis, sputum production, shortness of breath and wheezing.   Cardiovascular: Negative for chest pain, palpitations, orthopnea and leg swelling.  Gastrointestinal: Negative for abdominal pain, blood in stool, constipation, diarrhea, heartburn, melena, nausea and vomiting.  Genitourinary: Negative for dysuria, frequency and urgency.  Musculoskeletal: Positive for back pain and joint pain.  Skin: Negative.  Negative for itching and rash.  Neurological: Negative for dizziness, tingling, focal weakness, weakness and headaches.  Endo/Heme/Allergies: Does not bruise/bleed easily.  Psychiatric/Behavioral: Negative for depression. The patient is not nervous/anxious and does not have insomnia.      MEDICAL HISTORY:  Past Medical History:   Diagnosis Date  . Anemia   . CHF (congestive heart failure) ([place])   . Diabetes mellitus without complication ([place]) 60/73/7106   Currently no high BS.  Has come off meds.  But having episodes of low BS now.  . Dilated idiopathic cardiomyopathy ([place])   . GERD (gastroesophageal reflux disease)   . Hyperlipidemia   . Hypertension   . Hypothyroidism   . Osteoporosis   . Renal disorder    stage 3    SURGICAL HISTORY: Past Surgical History:  Procedure Laterality Date  . BUNIONECTOMY    . COLONOSCOPY    . ESOPHAGOGASTRODUODENOSCOPY (EGD) WITH PROPOFOL N/A 10/15/2017   Procedure: ESOPHAGOGASTRODUODENOSCOPY (EGD) WITH PROPOFOL;  Surgeon: Lollie Sails, MD;  Location: [facility] END[facility]COPY;  Service: Endoscopy;  Laterality: N/A;  . KYPHOPLASTY N/A 06/12/2017   Procedure: [id number];  Surgeon: Hessie Knows, MD;  Location: [facility] ORS;  Service: Orthopedics;  Laterality: N/A;  . TONSILLECTOMY      SOCIAL HISTORY: Social History   Socioeconomic History  . Marital status: Widowed    Spouse name: Not on file  . Number of children: Not on file  . Years of education: Not on file  . Highest education level: Not on file  Occupational History  . Not on file  Social Needs  . Financial resource strain: Not on file  . Food insecurity:    Worry: Not on file    Inability: Not on file  . Transportation needs:    Medical: Not on file    Non-medical: Not on file  Tobacco Use  . Smoking status: Former Smoker    Last attempt to quit: 04/18/1984    Years since quitting: 34.3  . Smokeless tobacco: Never Used  Substance and Sexual Activity  .  Alcohol use: No  . Drug use: No  . Sexual activity: Never  Lifestyle  . Physical activity:    Days per week: Not on file    Minutes per session: Not on file  . Stress: Not on file  Relationships  . Social connections:    Talks on phone: Not on file    Gets together: Not on file    Attends religious service: Not on file    Active member of club or  organization: Not on file    Attends meetings of clubs or organizations: Not on file    Relationship status: Not on file  . Intimate partner violence:    Fear of current or ex partner: Not on file    Emotionally abused: Not on file    Physically abused: Not on file    Forced sexual activity: Not on file  Other Topics Concern  . Not on file  Social History Narrative  . Not on file    FAMILY HISTORY: Family History  Problem Relation Age of Onset  . Premature CHD Brother     ALLERGIES:  is allergic to ciprofloxacin; digoxin and related; erythromycin; lopid [gemfibrozil]; nitrofurantoin; spironolactone; cefuroxime; and penicillins.  MEDICATIONS:  Current Outpatient Medications  Medication Sig Dispense Refill  . alendronate (FOSAMAX) 70 MG tablet Take 70 mg by mouth every 7 (seven) days.    Marland Kitchen aspirin EC 81 MG tablet Take 81 mg by mouth daily.    Marland Kitchen atorvastatin (LIPITOR) 10 MG tablet Take 1 tablet by mouth daily.    . carvedilol (COREG) 12.5 MG tablet Take by mouth 2 (two) times daily with a meal.     . ergocalciferol (VITAMIN D2) 50000 units capsule Take 50,000 Units by mouth once a week.    Marland Kitchen glucose blood (FREESTYLE LITE) test strip USE TO CHECK BLOOD SUGARS TWICE A DAY    . iron polysaccharides (NIFEREX) 150 MG capsule Take 150 mg by mouth daily.    . isosorbide mononitrate (IMDUR) 60 MG 24 hr tablet Take 0.5 tablets (30 mg total) by mouth daily. 30 tablet 1  . levothyroxine (SYNTHROID, LEVOTHROID) 25 MCG tablet Take 25 mcg by mouth daily before breakfast.    . pantoprazole (PROTONIX) 40 MG tablet Take 40 mg by mouth daily.    . sacubitril-valsartan (ENTRESTO) 24-26 MG Take 1 tablet by mouth every 12 (twelve) hours.    . torsemide (DEMADEX) 20 MG tablet Take 1 tablet by mouth daily.    Marland Kitchen KLOR-CON M10 10 MEQ tablet Take 10 mEq by mouth daily.    Marland Kitchen lisinopril (PRINIVIL,ZESTRIL) 20 MG tablet Take 20 mg by mouth 2 (two) times daily.    . sertraline (ZOLOFT) 50 MG tablet Take 50 mg by  mouth daily.     No current facility-administered medications for this visit.       Marland Kitchen  PHYSICAL EXAMINATION: ECOG PERFORMANCE STATUS: 0 - Asymptomatic  Vitals:   08/04/18 1035  BP: 115/67  Pulse: 62  Resp: 20  Temp: 97.8 F (36.6 C)   Filed Weights   08/04/18 1035  Weight: 135 lb (61.2 kg)    Physical Exam  Constitutional: She is oriented to person, place, and time.  Frail-appearing female patient.  She is in a wheelchair.  Accompanied by daughter.  HENT:  Head: Normocephalic and atraumatic.  Mouth/Throat: Oropharynx is clear and moist. No oropharyngeal exudate.  Eyes: Pupils are equal, round, and reactive to light.  Neck: Normal range of motion. Neck supple.  Cardiovascular:  Normal rate and regular rhythm.  Pulmonary/Chest: Breath sounds normal. No respiratory distress. She has no wheezes.  Abdominal: Soft. Bowel sounds are normal. She exhibits no distension and no mass. There is no abdominal tenderness. There is no rebound and no guarding.  Musculoskeletal: Normal range of motion.        General: No tenderness or edema.  Neurological: She is alert and oriented to person, place, and time.  Skin: Skin is warm.  Psychiatric: Affect normal.    LABORATORY DATA:  I have reviewed the data as listed Lab Results  Component Value Date   WBC 4.7 08/04/2018   HGB 13.6 08/04/2018   HCT 40.4 08/04/2018   MCV 92.0 08/04/2018   PLT 134 (L) 08/04/2018   Recent Labs    10/10/17 1933 01/14/18 1049 08/04/18 1003  NA 134* 134* 141  K 4.0 4.6 4.0  CL 98* 100 104  CO2 29 25 28   GLUCOSE 119* 106* 109*  BUN 28* 31* 34*  CREATININE 0.94 1.05* 0.96  CALCIUM 9.1 9.5 9.2  GFRNONAA 55* 48* 55*  GFRAA >60 55* >60  PROT 6.9 7.2 7.3  ALBUMIN 4.0 4.1 4.2  AST 29 31 25   ALT 18 21 19   ALKPHOS 50 54 42  BILITOT 0.7 0.8 0.8    RADIOGRAPHIC STUDIES: I have personally reviewed the radiological images as listed and agreed with the findings in the report. No results  found.  ASSESSMENT & PLAN:   Liver lesion # Liver lesions in a cirrhotic liver.-MRI liver poor quality/AFP initially 17 currently normal.  AFP from today pending.  Hold off further work-up at this time.  #Cirrhosis unclear etiology/compensated followed by GI.  Continue monitoring AFP today.  Will need ultrasound in 6 months.  #Back pain status post kyphoplasty improved-stable.  #Mild thrombocytopenia-intermittent.  Platelets 140s stable.   #History of smoking right lower lobe 6 mm nodule/CT scan fall 2018.  Recommend a follow-up CT scan the next 1 week.  Will call with results.  # DISPOSITION: # CT chest 1 week- will call with results/ultrasound right upper quadrant in 6 months. # follow up in 6 months-MD/labs-AFP/cbc/cmp-Dr.B  CC: Dr. Ginette Pitman  All questions were answered. The patient knows to call the clinic with any problems, questions or concerns.    Cammie Sickle, MD 08/04/2018 8:35 PM

## 2018-08-04 NOTE — Assessment & Plan Note (Addendum)
#   Liver lesions in a cirrhotic liver.-MRI liver poor quality/AFP initially 17 currently normal.  AFP from today pending.  Hold off further work-up at this time.  #Cirrhosis unclear etiology/compensated followed by GI.  Continue monitoring AFP today.  Will need ultrasound in 6 months.  #Back pain status post kyphoplasty improved-stable.  #Mild thrombocytopenia-intermittent.  Platelets 140s stable.   #History of smoking right lower lobe 6 mm nodule/CT scan fall 2018.  Recommend a follow-up CT scan the next 1 week.  Will call with results.  # DISPOSITION: # CT chest 1 week- will call with results/ultrasound right upper quadrant in 6 months. # follow up in 6 months-MD/labs-AFP/cbc/cmp-Dr.B  CC: Dr. Ginette Pitman

## 2018-08-05 LAB — AFP TUMOR MARKER: AFP, Serum, Tumor Marker: 2.1 ng/mL (ref 0.0–8.3)

## 2018-08-19 ENCOUNTER — Ambulatory Visit
Admission: RE | Admit: 2018-08-19 | Discharge: 2018-08-19 | Disposition: A | Discharge status: Home / Self Care | Payer: Medicare Other | Source: Ambulatory Visit | Attending: Internal Medicine | Admitting: Internal Medicine

## 2018-08-19 DIAGNOSIS — R911 Solitary pulmonary nodule: Secondary | ICD-10-CM | POA: Diagnosis present

## 2018-08-19 MED ORDER — IOHEXOL 300 MG/ML  SOLN
75.0000 mL | Freq: Once | INTRAMUSCULAR | Status: AC | PRN
  Administered 2018-08-19: 75 mL via INTRAVENOUS

## 2018-08-25 ENCOUNTER — Telehealth: Payer: Self-pay | Admitting: *Deleted

## 2018-08-25 NOTE — Telephone Encounter (Signed)
Patient's daughter contacted the cancer center for ct scan results. Results provided. Daughter thanked me for speaking to her.

## 2018-09-12 IMAGING — CT CT ANGIO CHEST
2 of 6 series · 18 of 46 positions shown · IV contrast (APPLIED)
Comparison: Chest 06/01/2017

CLINICAL DATA: Short of breath

EXAM:
CT ANGIOGRAPHY CHEST WITH CONTRAST
TECHNIQUE: Multidetector CT imaging of the chest was performed using the
standard protocol during bolus administration of intravenous
contrast. Multiplanar CT image reconstructions and MIPs were
obtained to evaluate the vascular anatomy.
CONTRAST:  60mL [id number] IOPAMIDOL ([id number]) INJECTION 76%

[Series 5: thins · axial · 0.70mm/px · z∈[-707,-464]mm · 15 of 267 slices shown]
[im 12/267  lung]
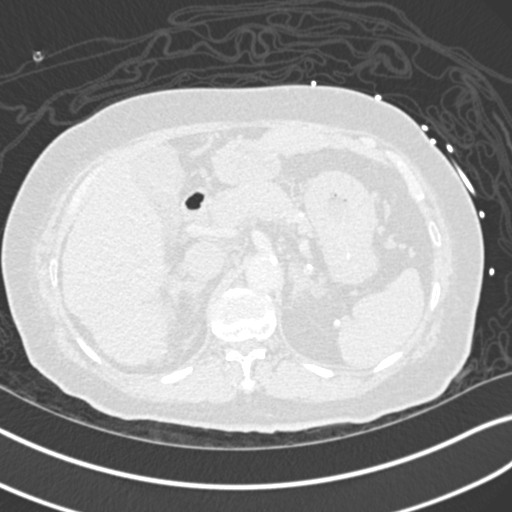
[im 35/267  soft-tissue]
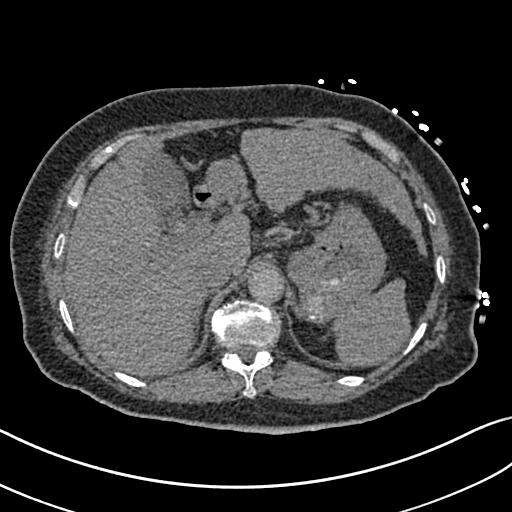
[im 47/267  lung]
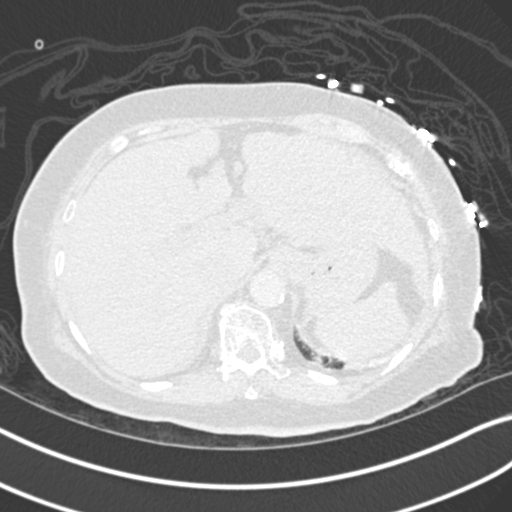
[im 70/267  soft-tissue]
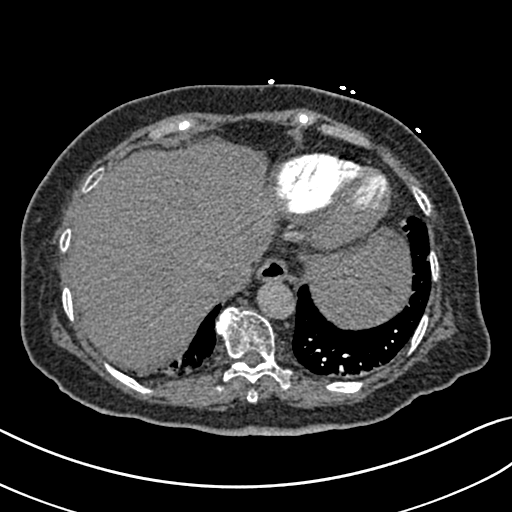
[im 81/267  lung]
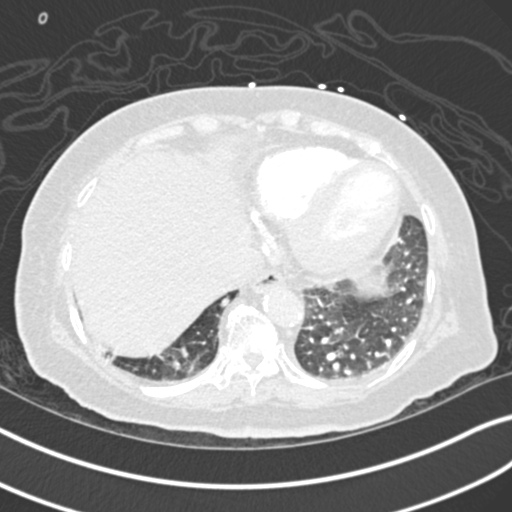
[im 105/267  soft-tissue]
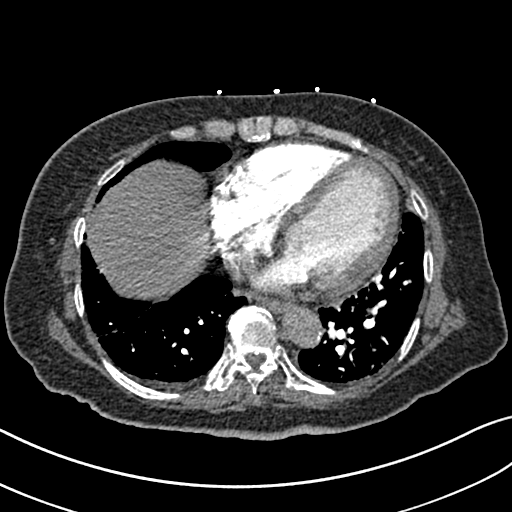
[im 116/267  lung]
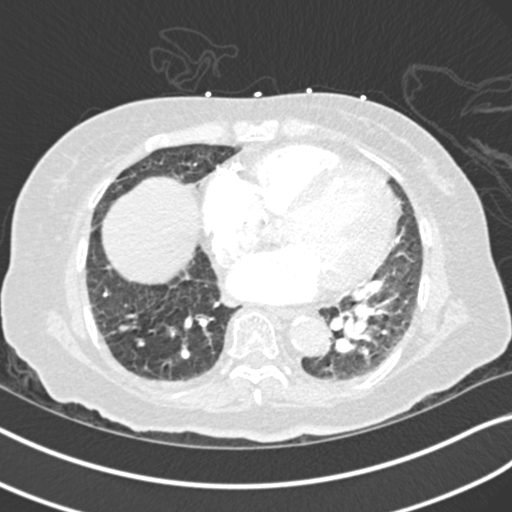
[im 139/267  soft-tissue]
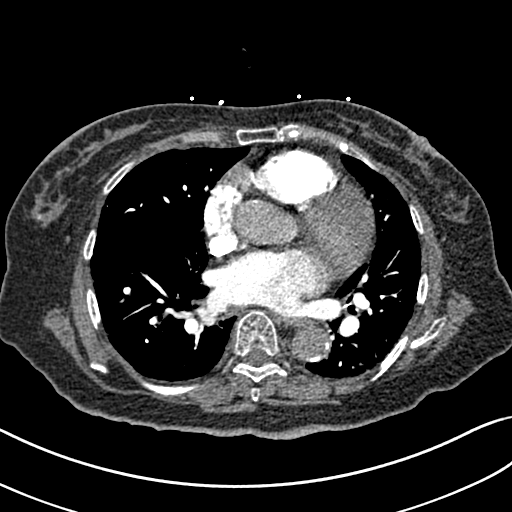
[im 151/267  lung]
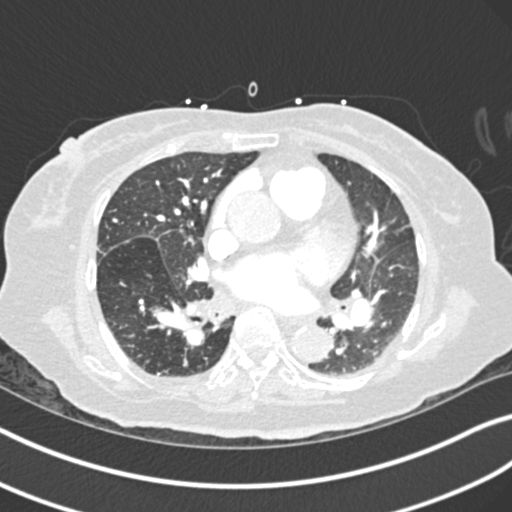
[im 162/267  soft-tissue]
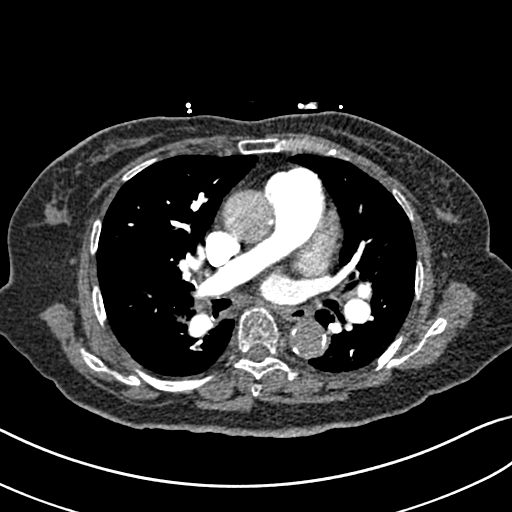
[im 186/267  lung]
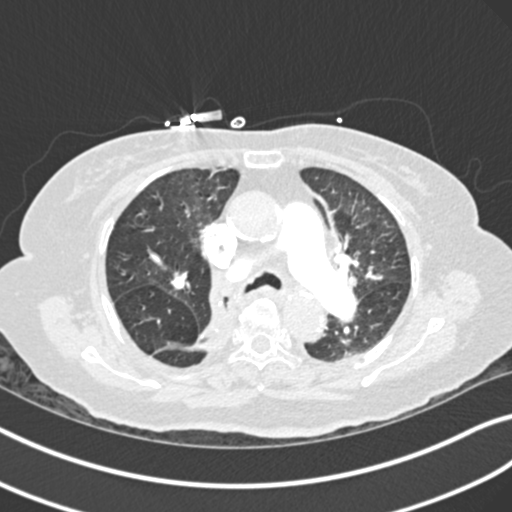
[im 197/267  soft-tissue]
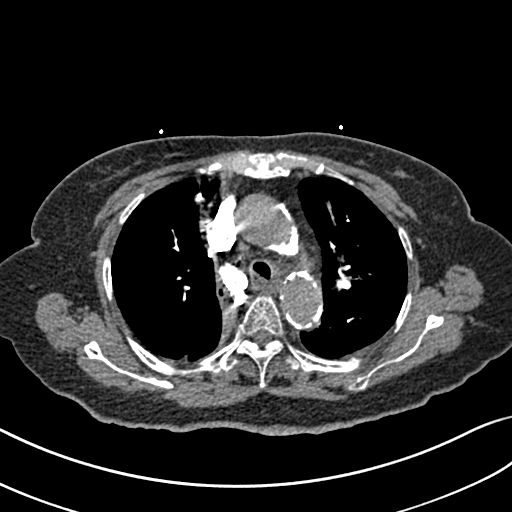
[im 220/267  lung]
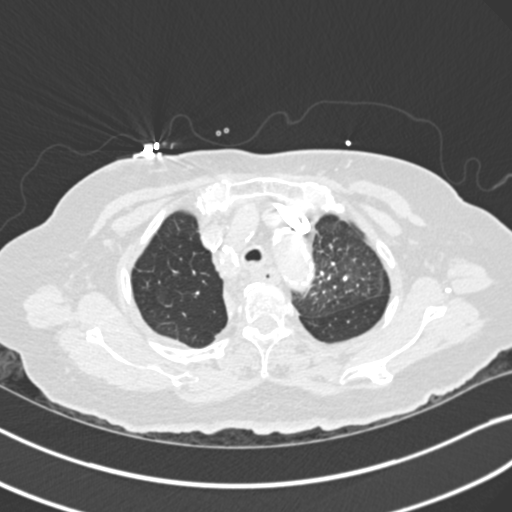
[im 232/267  soft-tissue]
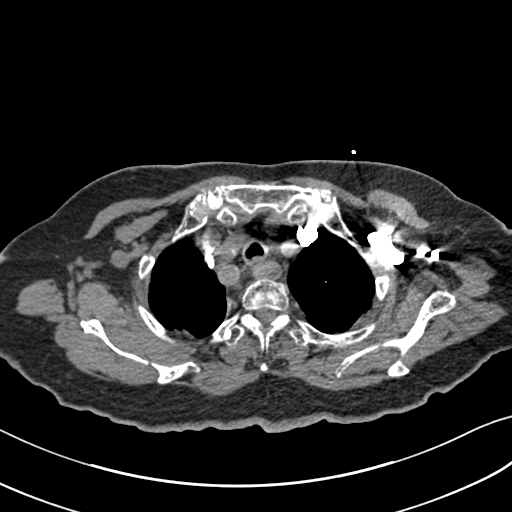
[im 255/267  lung]
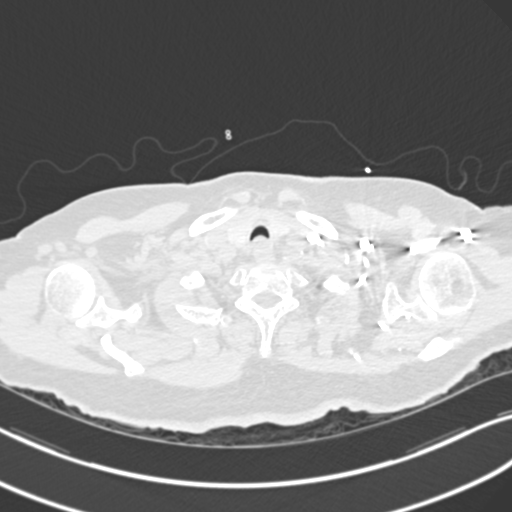

[Series 7: coronal mpr · coronal · 0.52mm/px · 3 of 82 slices shown]
[im 21/82  soft-tissue]
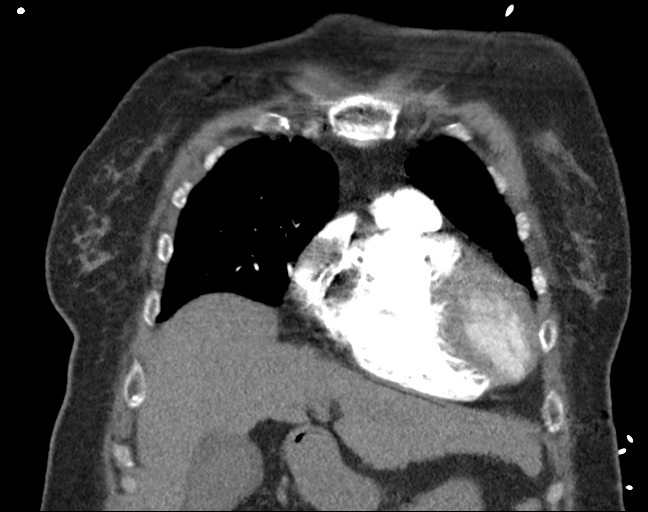
[im 41/82  soft-tissue]
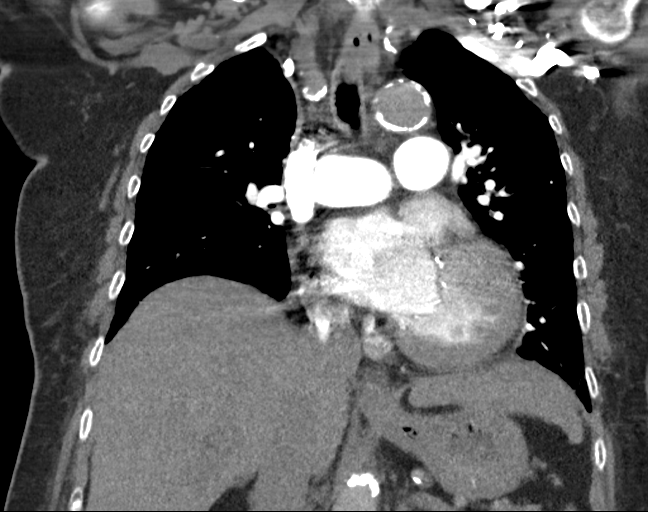
[im 61/82  soft-tissue]
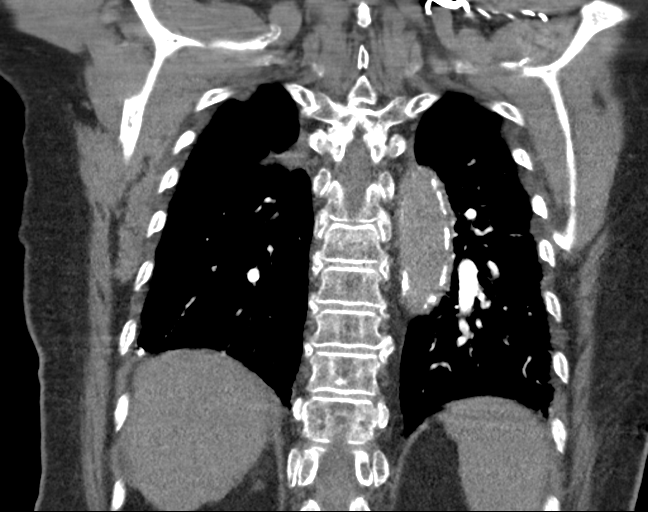

[18 of 46 positions shown; findings below may reference images not displayed]

FINDINGS: Cardiovascular: Negative for pulmonary embolism. Pulmonary artery
enlargement bilaterally compatible with pulmonary artery
hypertension

Extensive atherosclerotic calcification aortic arch and coronary
artery. Heart size within normal limits. No pericardial effusion

Mediastinum/Nodes: Negative for mass or adenopathy

Lungs/Pleura: Scarring right medial upper lobe. Chronic lung
disease. Negative for infiltrate or effusion. Partially calcified
nodule right lung base unchanged from prior PET-CT 03/13/2017

Upper Abdomen: Cirrhosis of the liver. No ascites. Spleen not
enlarged

Musculoskeletal: Negative

Review of the MIP images confirms the above findings.
IMPRESSION: Negative for pulmonary embolism.  Pulmonary artery hypertension

Diffuse atherosclerotic disease including the coronary artery.

Cirrhosis

Aortic Atherosclerosis ([id number]-5D0.0).

## 2018-09-16 IMAGING — CT CT ABD-PELV W/ CM
2 of 5 series · 15 of 46 positions shown, 17 images · IV contrast (APPLIED)
Comparison: 03/04/2017 and 02/24/2017

CLINICAL DATA: Bilateral back pain radiating to anterior abdomen
with nausea and vomiting as well as diarrhea 4 days.

EXAM:
CT ABDOMEN AND PELVIS WITH CONTRAST
TECHNIQUE: Multidetector CT imaging of the abdomen and pelvis was performed
using the standard protocol following bolus administration of
intravenous contrast.
CONTRAST:  60mL [id number] IOPAMIDOL ([id number]) INJECTION 61%

[Series 2: axial st · axial · 0.64mm/px · z∈[+332,+742]mm · 12 of 92 slices shown, 14 images]
[im 5/92  soft-tissue]
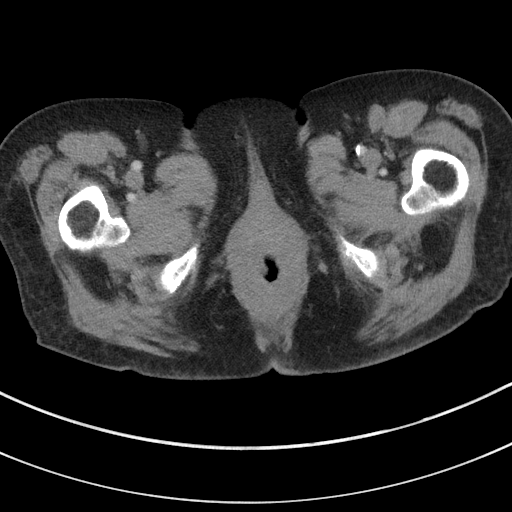
[im 5/92  bone]
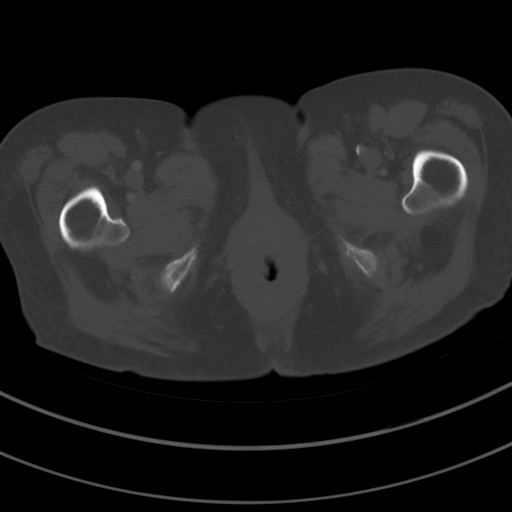
[im 15/92  soft-tissue]
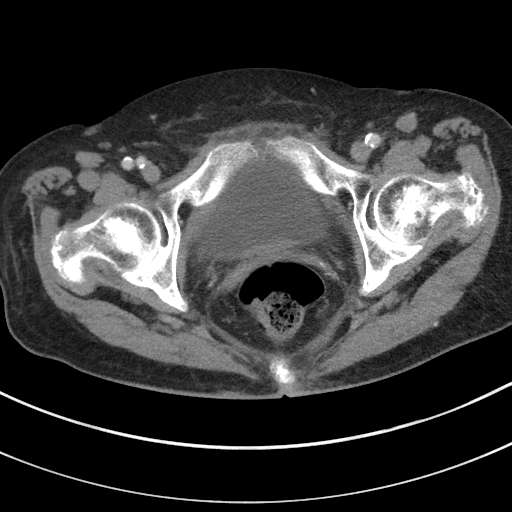
[im 20/92  soft-tissue]
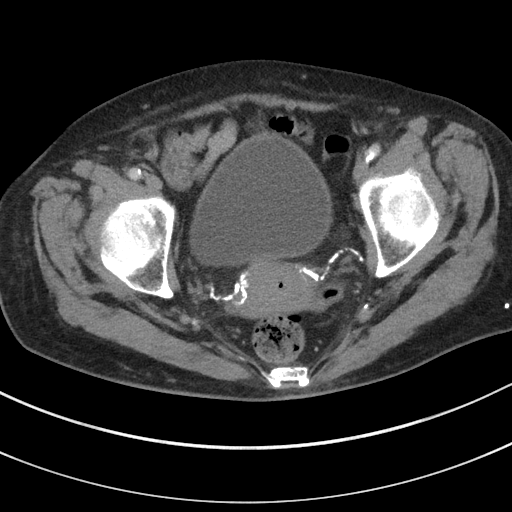
[im 29/92  soft-tissue]
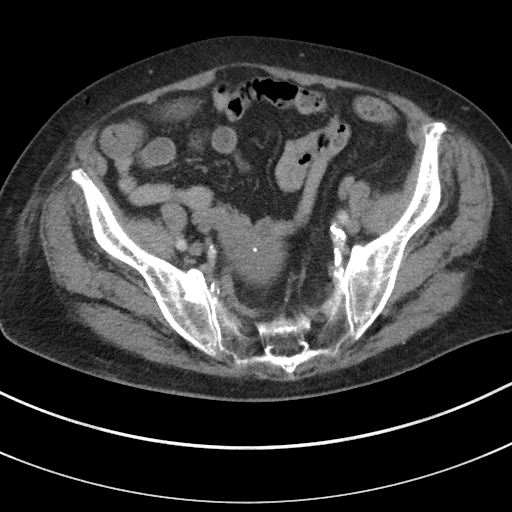
[im 34/92  soft-tissue]
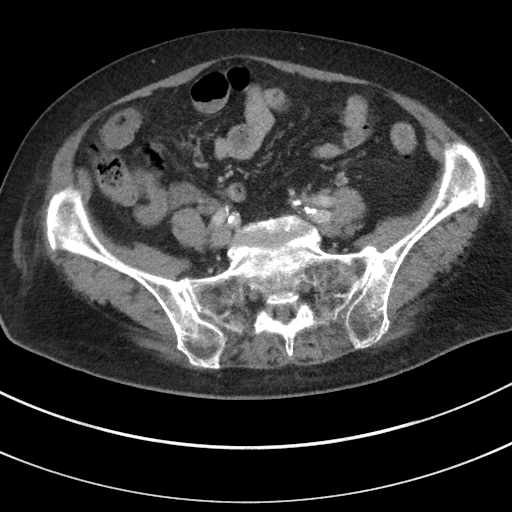
[im 44/92  soft-tissue]
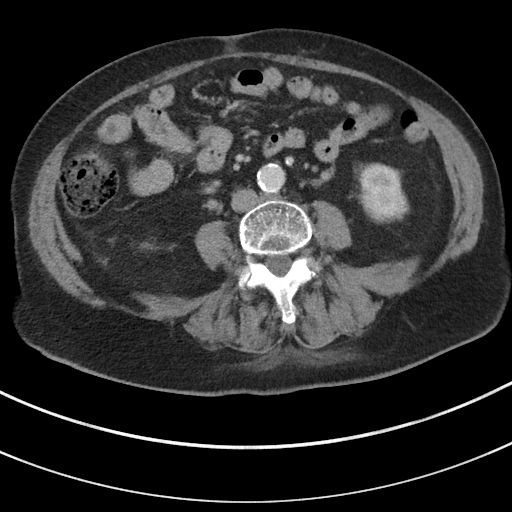
[im 48/92  soft-tissue]
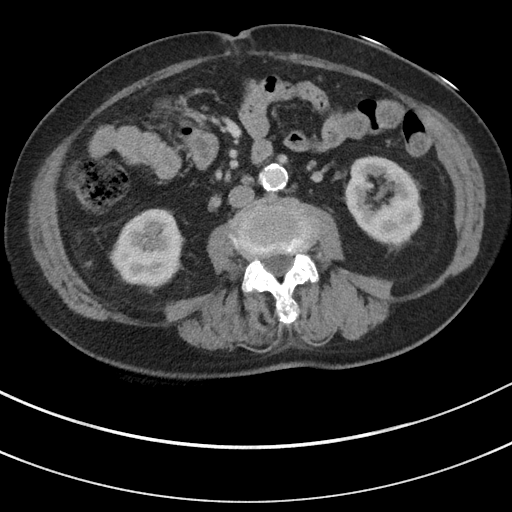
[im 58/92  soft-tissue]
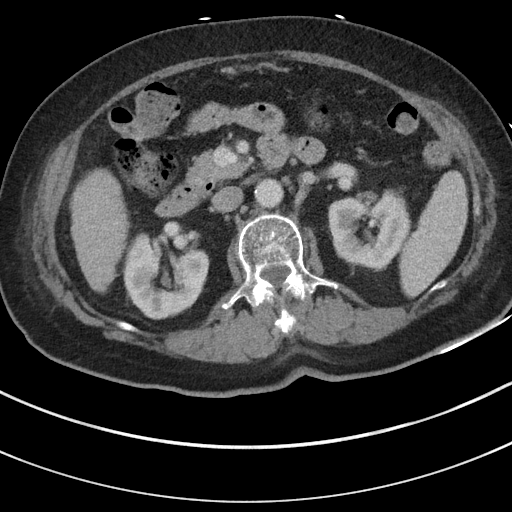
[im 63/92  soft-tissue]
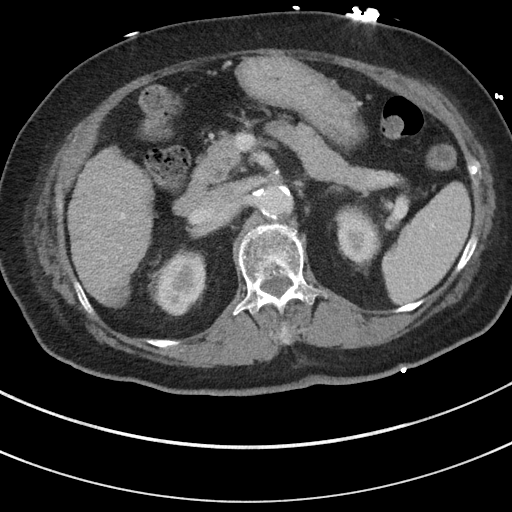
[im 63/92  bone]
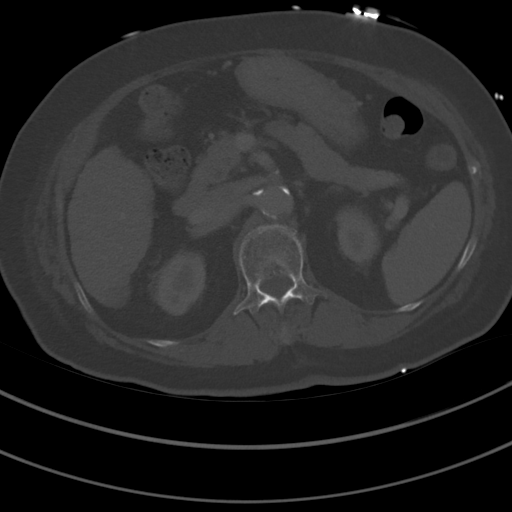
[im 72/92  soft-tissue]
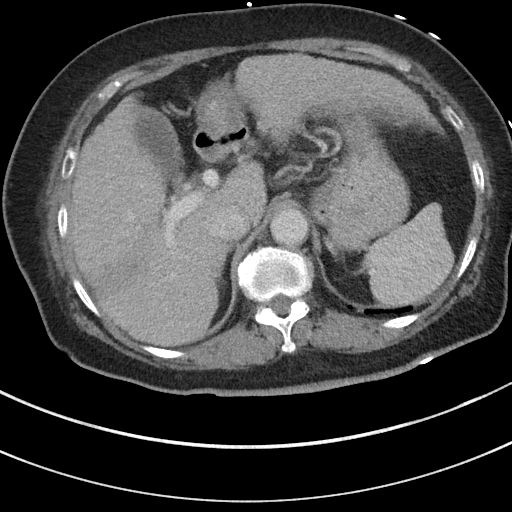
[im 77/92  soft-tissue]
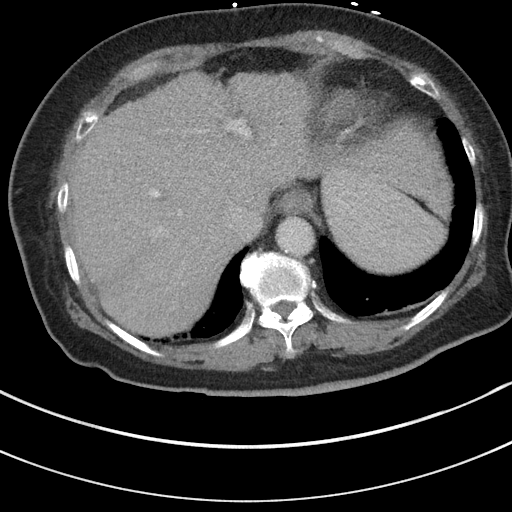
[im 87/92  soft-tissue]
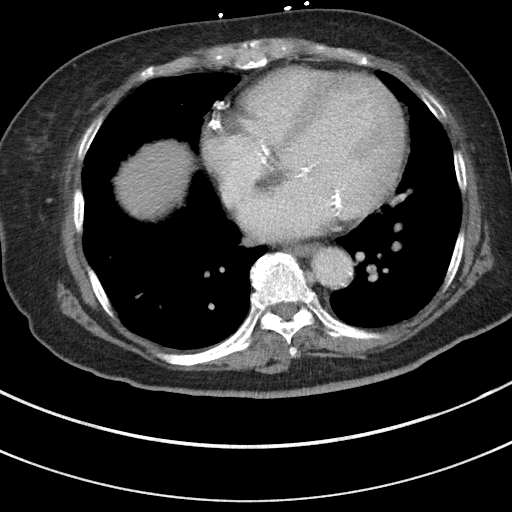

[Series 5: coronal st · coronal · 0.68mm/px · 3 of 74 slices shown]
[im 25/74  soft-tissue]
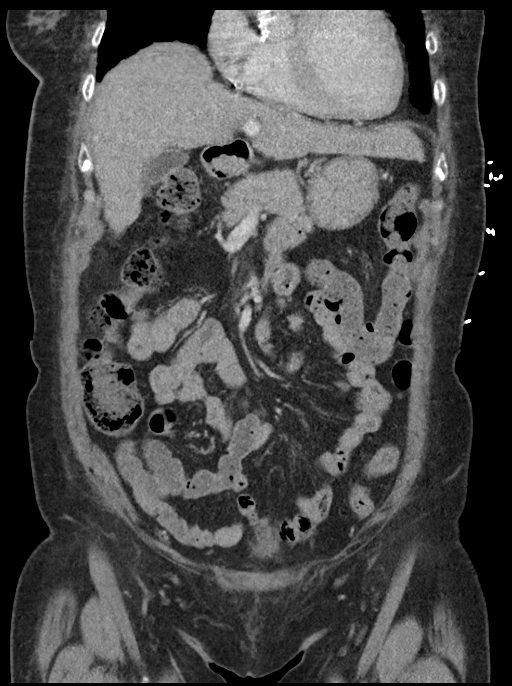
[im 33/74  soft-tissue]
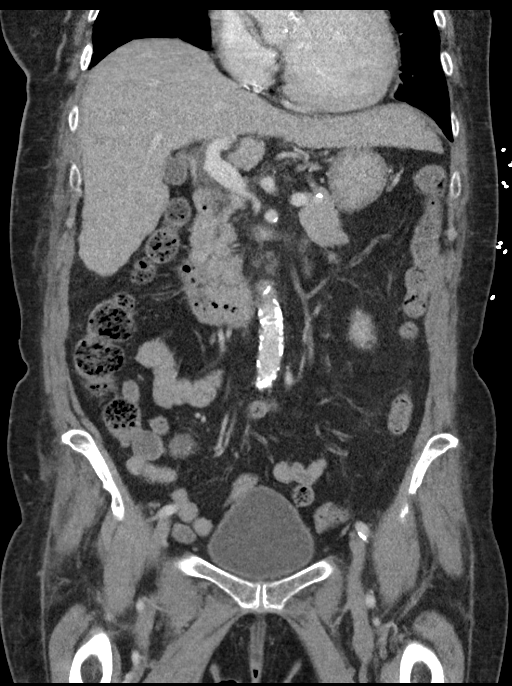
[im 41/74  soft-tissue]
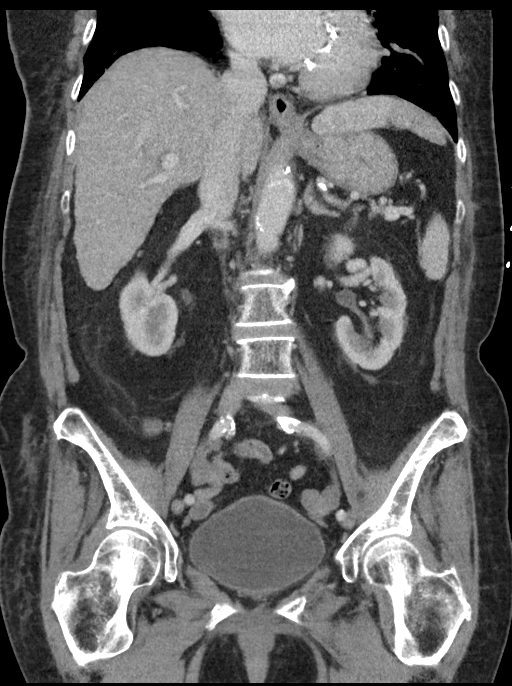

[15 of 46 positions shown; findings below may reference images not displayed]

FINDINGS: Lower chest: 6 mm nodule over the medial right lower lobe unchanged.
Scarring with mild nodularity over the posterolateral right lower
lobe without significant change. Calcification of the right coronary
artery and adjacent the mitral valve annulus. Mild prominence of the
heart.

Hepatobiliary: Nodular contour to the liver with stable hypodense
nodules over the right lobe previously evaluated by a PET-CT and
found to be non hypermetabolic. Gallbladder and biliary tree are
normal.

Pancreas: Normal.

Spleen: Subcentimeter hypodensity over the inferior aspect of the
spleen unchanged likely a cyst or hemangioma.

Adrenals/Urinary Tract: Adrenal glands are normal. Kidneys are
normal in size without hydronephrosis or nephrolithiasis. Ureters
are within normal. Bladder is normal.

Stomach/Bowel: Stomach and small bowel are normal. The appendix is
normal. Colon is unremarkable.

Vascular/Lymphatic: There is mild calcified plaque over the
abdominal aorta and iliac arteries. No adenopathy.

Reproductive: Within normal.

Other: No free fluid or focal inflammatory change.

Musculoskeletal: Mild degenerate change of the spine and hips.
IMPRESSION: No acute findings in the abdomen/pelvis.

Evidence of known cirrhosis with stable right lobe nodules.

Scar in the right base. 6 mm nodule over the right lower lobe
unchanged. Recommend followup CT 6 months. This recommendation
follows the consensus statement: Guidelines for Management of Small
Pulmonary Nodules Detected on CT Scans: A Statement from the
Online at: [URL]

Aortic Atherosclerosis (JXHDZ-XAC.C).

## 2019-01-05 ENCOUNTER — Other Ambulatory Visit: Payer: Self-pay

## 2019-01-05 ENCOUNTER — Inpatient Hospital Stay
Admission: EM | Admit: 2019-01-05 | Discharge: 2019-01-08 | DRG: 313 | Disposition: A | Discharge status: Home Health | Payer: Medicare Other | Attending: Internal Medicine | Admitting: Internal Medicine

## 2019-01-05 ENCOUNTER — Observation Stay
Admit: 2019-01-05 | Discharge: 2019-01-05 | Disposition: A | Discharge status: Home / Self Care | Payer: Medicare Other | Attending: Family Medicine | Admitting: Family Medicine

## 2019-01-05 ENCOUNTER — Emergency Department: Payer: Medicare Other

## 2019-01-05 DIAGNOSIS — Z881 Allergy status to other antibiotic agents status: Secondary | ICD-10-CM

## 2019-01-05 DIAGNOSIS — I214 Non-ST elevation (NSTEMI) myocardial infarction: Secondary | ICD-10-CM

## 2019-01-05 DIAGNOSIS — E785 Hyperlipidemia, unspecified: Secondary | ICD-10-CM | POA: Diagnosis present

## 2019-01-05 DIAGNOSIS — Z880 Allergy status to penicillin: Secondary | ICD-10-CM

## 2019-01-05 DIAGNOSIS — M810 Age-related osteoporosis without current pathological fracture: Secondary | ICD-10-CM | POA: Diagnosis present

## 2019-01-05 DIAGNOSIS — Z7983 Long term (current) use of bisphosphonates: Secondary | ICD-10-CM

## 2019-01-05 DIAGNOSIS — R079 Chest pain, unspecified: Principal | ICD-10-CM | POA: Diagnosis present

## 2019-01-05 DIAGNOSIS — K219 Gastro-esophageal reflux disease without esophagitis: Secondary | ICD-10-CM | POA: Diagnosis present

## 2019-01-05 DIAGNOSIS — Z79890 Hormone replacement therapy (postmenopausal): Secondary | ICD-10-CM

## 2019-01-05 DIAGNOSIS — N179 Acute kidney failure, unspecified: Secondary | ICD-10-CM | POA: Diagnosis present

## 2019-01-05 DIAGNOSIS — E039 Hypothyroidism, unspecified: Secondary | ICD-10-CM | POA: Diagnosis present

## 2019-01-05 DIAGNOSIS — Z888 Allergy status to other drugs, medicaments and biological substances status: Secondary | ICD-10-CM

## 2019-01-05 DIAGNOSIS — I420 Dilated cardiomyopathy: Secondary | ICD-10-CM | POA: Diagnosis present

## 2019-01-05 DIAGNOSIS — E1122 Type 2 diabetes mellitus with diabetic chronic kidney disease: Secondary | ICD-10-CM | POA: Diagnosis present

## 2019-01-05 DIAGNOSIS — I951 Orthostatic hypotension: Secondary | ICD-10-CM | POA: Diagnosis present

## 2019-01-05 DIAGNOSIS — I130 Hypertensive heart and chronic kidney disease with heart failure and stage 1 through stage 4 chronic kidney disease, or unspecified chronic kidney disease: Secondary | ICD-10-CM | POA: Diagnosis present

## 2019-01-05 DIAGNOSIS — Z7982 Long term (current) use of aspirin: Secondary | ICD-10-CM

## 2019-01-05 DIAGNOSIS — R42 Dizziness and giddiness: Secondary | ICD-10-CM | POA: Diagnosis not present

## 2019-01-05 DIAGNOSIS — I5042 Chronic combined systolic (congestive) and diastolic (congestive) heart failure: Secondary | ICD-10-CM | POA: Diagnosis present

## 2019-01-05 DIAGNOSIS — Z66 Do not resuscitate: Secondary | ICD-10-CM | POA: Diagnosis present

## 2019-01-05 DIAGNOSIS — N183 Chronic kidney disease, stage 3 (moderate): Secondary | ICD-10-CM | POA: Diagnosis present

## 2019-01-05 DIAGNOSIS — Z20828 Contact with and (suspected) exposure to other viral communicable diseases: Secondary | ICD-10-CM | POA: Diagnosis present

## 2019-01-05 DIAGNOSIS — Z8249 Family history of ischemic heart disease and other diseases of the circulatory system: Secondary | ICD-10-CM

## 2019-01-05 DIAGNOSIS — Z87891 Personal history of nicotine dependence: Secondary | ICD-10-CM

## 2019-01-05 LAB — PROTIME-INR
INR: 1 (ref 0.8–1.2)
Prothrombin Time: 13.3 seconds (ref 11.4–15.2)

## 2019-01-05 LAB — BASIC METABOLIC PANEL
Anion gap: 12 (ref 5–15)
BUN: 46 mg/dL — ABNORMAL HIGH (ref 8–23)
CO2: 24 mmol/L (ref 22–32)
Calcium: 9.2 mg/dL (ref 8.9–10.3)
Chloride: 102 mmol/L (ref 98–111)
Creatinine, Ser: 1.41 mg/dL — ABNORMAL HIGH (ref 0.44–1.00)
GFR calc Af Amer: 40 mL/min — ABNORMAL LOW (ref >60)
GFR calc non Af Amer: 34 mL/min — ABNORMAL LOW (ref >60)
Glucose, Bld: 118 mg/dL — ABNORMAL HIGH (ref 70–99)
Potassium: 4.1 mmol/L (ref 3.5–5.1)
Sodium: 138 mmol/L (ref 135–145)

## 2019-01-05 LAB — CBC
HCT: 40.2 % (ref 36.0–46.0)
Hemoglobin: 13.4 g/dL (ref 12.0–15.0)
MCH: 30.8 pg (ref 26.0–34.0)
MCHC: 33.3 g/dL (ref 30.0–36.0)
MCV: 92.4 fL (ref 80.0–100.0)
Platelets: 126 10*3/uL — ABNORMAL LOW (ref 150–400)
RBC: 4.35 MIL/uL (ref 3.87–5.11)
RDW: 12.6 % (ref 11.5–15.5)
WBC: 6 10*3/uL (ref 4.0–10.5)
nRBC: 0 % (ref 0.0–0.2)

## 2019-01-05 LAB — SARS CORONAVIRUS 2 BY RT PCR (HOSPITAL ORDER, PERFORMED IN ~~LOC~~ HOSPITAL LAB): SARS Coronavirus 2: NEGATIVE

## 2019-01-05 LAB — TROPONIN I (HIGH SENSITIVITY)
Troponin I (High Sensitivity): 40 ng/L — ABNORMAL HIGH (ref <18)
Troponin I (High Sensitivity): 40 ng/L — ABNORMAL HIGH (ref <18)
Troponin I (High Sensitivity): 37 ng/L — ABNORMAL HIGH (ref <18)

## 2019-01-05 LAB — HEPARIN LEVEL (UNFRACTIONATED): Heparin Unfractionated: 0.4 IU/mL (ref 0.30–0.70)

## 2019-01-05 LAB — APTT: aPTT: 36 seconds (ref 24–36)

## 2019-01-05 MED ORDER — PANTOPRAZOLE SODIUM 40 MG PO TBEC
40.0000 mg | DELAYED_RELEASE_TABLET | Freq: Every day | ORAL | Status: DC
  Administered 2019-01-05 – 2019-01-08 (×4): 40 mg via ORAL
  Filled 2019-01-05 (×4): qty 1

## 2019-01-05 MED ORDER — ASPIRIN 81 MG PO CHEW
324.0000 mg | CHEWABLE_TABLET | Freq: Once | ORAL | Status: AC
  Administered 2019-01-05: 07:00:00 324 mg via ORAL
  Filled 2019-01-05: qty 4

## 2019-01-05 MED ORDER — LEVOTHYROXINE SODIUM 25 MCG PO TABS
25.0000 ug | ORAL_TABLET | Freq: Every day | ORAL | Status: DC
  Administered 2019-01-06 – 2019-01-08 (×3): 25 ug via ORAL
  Filled 2019-01-05 (×3): qty 1

## 2019-01-05 MED ORDER — ONDANSETRON HCL 4 MG/2ML IJ SOLN: 4.0000 mg | Freq: Four times a day (QID) | INTRAMUSCULAR | Status: DC | PRN

## 2019-01-05 MED ORDER — SACUBITRIL-VALSARTAN 24-26 MG PO TABS
1.0000 | ORAL_TABLET | Freq: Two times a day (BID) | ORAL | Status: DC
  Administered 2019-01-05 – 2019-01-08 (×7): 1 via ORAL
  Filled 2019-01-05 (×7): qty 1

## 2019-01-05 MED ORDER — ISOSORBIDE MONONITRATE ER 30 MG PO TB24
30.0000 mg | ORAL_TABLET | Freq: Every day | ORAL | Status: DC
  Administered 2019-01-05 – 2019-01-08 (×4): 30 mg via ORAL
  Filled 2019-01-05 (×4): qty 1

## 2019-01-05 MED ORDER — POTASSIUM CHLORIDE CRYS ER 10 MEQ PO TBCR
10.0000 meq | EXTENDED_RELEASE_TABLET | Freq: Every day | ORAL | Status: DC
  Administered 2019-01-05 – 2019-01-08 (×4): 10 meq via ORAL
  Filled 2019-01-05 (×4): qty 1

## 2019-01-05 MED ORDER — ASPIRIN EC 325 MG PO TBEC
325.0000 mg | DELAYED_RELEASE_TABLET | Freq: Every day | ORAL | Status: DC
  Administered 2019-01-05 – 2019-01-08 (×4): 325 mg via ORAL
  Filled 2019-01-05 (×4): qty 1

## 2019-01-05 MED ORDER — LISINOPRIL 20 MG PO TABS: 20.0000 mg | ORAL_TABLET | Freq: Two times a day (BID) | ORAL | Status: DC

## 2019-01-05 MED ORDER — HEPARIN BOLUS VIA INFUSION
3100.0000 [IU] | Freq: Once | INTRAVENOUS | Status: AC
  Administered 2019-01-05: 08:00:00 3100 [IU] via INTRAVENOUS
  Filled 2019-01-05: qty 3100

## 2019-01-05 MED ORDER — ATORVASTATIN CALCIUM 20 MG PO TABS
40.0000 mg | ORAL_TABLET | Freq: Every day | ORAL | Status: DC
  Administered 2019-01-05 – 2019-01-07 (×3): 40 mg via ORAL
  Filled 2019-01-05 (×3): qty 2

## 2019-01-05 MED ORDER — ALUM & MAG HYDROXIDE-SIMETH 200-200-20 MG/5ML PO SUSP
30.0000 mL | Freq: Once | ORAL | Status: DC
  Filled 2019-01-05 (×2): qty 30

## 2019-01-05 MED ORDER — LIDOCAINE VISCOUS HCL 2 % MT SOLN
15.0000 mL | Freq: Once | OROMUCOSAL | Status: DC
  Filled 2019-01-05: qty 15

## 2019-01-05 MED ORDER — ZOLPIDEM TARTRATE 5 MG PO TABS: 5.0000 mg | ORAL_TABLET | Freq: Every evening | ORAL | Status: DC | PRN

## 2019-01-05 MED ORDER — SERTRALINE HCL 50 MG PO TABS
50.0000 mg | ORAL_TABLET | Freq: Every day | ORAL | Status: DC
  Administered 2019-01-05 – 2019-01-08 (×4): 50 mg via ORAL
  Filled 2019-01-05 (×4): qty 1

## 2019-01-05 MED ORDER — TORSEMIDE 20 MG PO TABS
20.0000 mg | ORAL_TABLET | Freq: Every day | ORAL | Status: DC
  Administered 2019-01-05 – 2019-01-06 (×2): 20 mg via ORAL
  Filled 2019-01-05 (×2): qty 1

## 2019-01-05 MED ORDER — POLYSACCHARIDE IRON COMPLEX 150 MG PO CAPS
150.0000 mg | ORAL_CAPSULE | Freq: Every day | ORAL | Status: DC
  Administered 2019-01-05 – 2019-01-08 (×4): 150 mg via ORAL
  Filled 2019-01-05 (×5): qty 1

## 2019-01-05 MED ORDER — HEPARIN (PORCINE) 25000 UT/250ML-% IV SOLN
700.0000 [IU]/h | INTRAVENOUS | Status: DC
  Administered 2019-01-05: 600 [IU]/h via INTRAVENOUS
  Administered 2019-01-06: 700 [IU]/h via INTRAVENOUS
  Filled 2019-01-05 (×2): qty 250

## 2019-01-05 MED ORDER — ATORVASTATIN CALCIUM 20 MG PO TABS: 10.0000 mg | ORAL_TABLET | Freq: Every day | ORAL | Status: DC

## 2019-01-05 MED ORDER — SODIUM CHLORIDE 0.9 % IV SOLN
INTRAVENOUS | Status: DC
  Administered 2019-01-05: 10:00:00 via INTRAVENOUS

## 2019-01-05 MED ORDER — ACETAMINOPHEN 325 MG PO TABS: 650.0000 mg | ORAL_TABLET | ORAL | Status: DC | PRN

## 2019-01-05 MED ORDER — CARVEDILOL 12.5 MG PO TABS
12.5000 mg | ORAL_TABLET | Freq: Two times a day (BID) | ORAL | Status: DC
  Administered 2019-01-05 – 2019-01-08 (×6): 12.5 mg via ORAL
  Filled 2019-01-05 (×7): qty 1

## 2019-01-05 MED ORDER — VITAMIN D (ERGOCALCIFEROL) 1.25 MG (50000 UNIT) PO CAPS: 50000.0000 [IU] | ORAL_CAPSULE | ORAL | Status: DC

## 2019-01-05 MED ORDER — ASPIRIN EC 81 MG PO TBEC: 81.0000 mg | DELAYED_RELEASE_TABLET | Freq: Every day | ORAL | Status: DC

## 2019-01-05 MED ORDER — METOPROLOL TARTRATE 25 MG PO TABS: 25.0000 mg | ORAL_TABLET | Freq: Two times a day (BID) | ORAL | Status: DC

## 2019-01-05 NOTE — ED Notes (Signed)
heparin infusion and bolus verified with RN Kirke Shaggy

## 2019-01-05 NOTE — ED Notes (Signed)
Patient transported to CT 

## 2019-01-05 NOTE — Plan of Care (Signed)
Pt newly admitted to cardiac telemetry unit.   Problem: Education: Goal: Knowledge of General Education information will improve Description: Including pain rating scale, medication(s)/side effects and non-pharmacologic comfort measures Outcome: Not Progressing   Problem: Health Behavior/Discharge Planning: Goal: Ability to manage health-related needs will improve Outcome: Not Progressing   Problem: Clinical Measurements: Goal: Ability to maintain clinical measurements within normal limits will improve Outcome: Not Progressing Goal: Will remain free from infection Outcome: Not Progressing Goal: Diagnostic test results will improve Outcome: Not Progressing Goal: Respiratory complications will improve Outcome: Not Progressing Goal: Cardiovascular complication will be avoided Outcome: Not Progressing   Problem: Activity: Goal: Risk for activity intolerance will decrease Outcome: Not Progressing   Problem: Nutrition: Goal: Adequate nutrition will be maintained Outcome: Not Progressing   Problem: Coping: Goal: Level of anxiety will decrease Outcome: Not Progressing   Problem: Elimination: Goal: Will not experience complications related to bowel motility Outcome: Not Progressing Goal: Will not experience complications related to urinary retention Outcome: Not Progressing   Problem: Pain Managment: Goal: General experience of comfort will improve Outcome: Not Progressing   Problem: Safety: Goal: Ability to remain free from injury will improve Outcome: Not Progressing   Problem: Skin Integrity: Goal: Risk for impaired skin integrity will decrease Outcome: Not Progressing

## 2019-01-05 NOTE — ED Notes (Signed)
Floor unable to take report at this time.

## 2019-01-05 NOTE — Progress Notes (Signed)
*  PRELIMINARY RESULTS* Echocardiogram 2D Echocardiogram has been performed.  Jacqueline [name] 01/05/2019, 12:04 PM

## 2019-01-05 NOTE — Consult Note (Signed)
ANTICOAGULATION CONSULT NOTE - Initial Consult  Pharmacy Consult for heparin infusion  Indication: chest pain/ACS  Allergies  Allergen Reactions  . Ciprofloxacin   . Digoxin And Related   . Erythromycin   . Lopid [Gemfibrozil]   . Nitrofurantoin   . Spironolactone Other (See Comments)    Hospitalized for dehydration  . Cefuroxime Rash  . Penicillins Rash    Has patient had a PCN reaction causing immediate rash, facial/tongue/throat swelling, SOB or lightheadedness with hypotension: Unknown Has patient had a PCN reaction causing severe rash involving mucus membranes or skin necrosis: No Has patient had a PCN reaction that required hospitalization: No Has patient had a PCN reaction occurring within the last 10 years: No If all of the above answers are "NO", then may proceed with Cephalosporin use.    Patient Measurements: Height: 4\' 9"  (144.8 cm) Weight: 137 lb (62.1 kg) IBW/kg (Calculated) : 38.6 Heparin Dosing Weight: 52 kg  Vital Signs: Temp: 99 F (37.2 C) (07/22 0550) Temp Source: Oral (07/22 0550) BP: 182/77 (07/22 0550) Pulse Rate: 77 (07/22 0550)  Labs: Recent Labs    01/05/19 0553  HGB 13.4  HCT 40.2  PLT 126*  CREATININE 1.41*  TROPONINIHS 40*    Estimated Creatinine Clearance: 22.5 mL/min (A) (by C-G formula based on SCr of 1.41 mg/dL (H)).   Medical History: Past Medical History:  Diagnosis Date  . Anemia   . CHF (congestive heart failure) ([employer])   . Diabetes mellitus without complication ([place]) 54/65/0354   Currently no high BS.  Has come off meds.  But having episodes of low BS now.  . Dilated idiopathic cardiomyopathy ([place])   . GERD (gastroesophageal reflux disease)   . Hyperlipidemia   . Hypertension   . Hypothyroidism   . Osteoporosis   . Renal disorder    stage 3    Assessment: Pharmacy consulted for heparin dosing and monitoring for 84 yo female admitted with ACS/NSTEMI. No anti-coagulants reported PTA.   Goal of Therapy:  Heparin  level 0.3-0.7 units/ml Monitor platelets by anticoagulation protocol: Yes   Plan:  Baseline labs ordered  Give 3100 units bolus x 1 Start heparin infusion at 600 units/hr Check anti-Xa level in 8 hours and daily while on heparin Continue to monitor H&H and platelets  Pernell Dupre, PharmD, BCPS Clinical Pharmacist 01/05/2019 7:11 AM

## 2019-01-05 NOTE — ED Notes (Signed)
ED TO INPATIENT HANDOFF REPORT  ED Nurse Name and Phone #: Dionisio David Name/Age/Gender Jacqueline [name] 85 y.o. female Room/Bed: ED08A/ED08A  Code Status   Code Status: Full Code  Home/SNF/Other Home Patient oriented to: self, place, time and situation Is this baseline? Yes   Triage Complete: Triage complete  Chief Complaint [facility] - Dizziness  Triage Note Pt arrives to ED from home via [facility] with c/o dizziness and weakness that started last night. No c/o N/V/D or fever. Per EMS, pt wears 2.5L home O2 24/7. Dr Owens Shark at bedside upon pt's arrival to ED.   Allergies Allergies  Allergen Reactions  . Ciprofloxacin   . Digoxin And Related   . Erythromycin   . Lopid [Gemfibrozil]   . Nitrofurantoin   . Spironolactone Other (See Comments)    Hospitalized for dehydration  . Cefuroxime Rash  . Penicillins Rash    Has patient had a PCN reaction causing immediate rash, facial/tongue/throat swelling, SOB or lightheadedness with hypotension: Unknown Has patient had a PCN reaction causing severe rash involving mucus membranes or skin necrosis: No Has patient had a PCN reaction that required hospitalization: No Has patient had a PCN reaction occurring within the last 10 years: No If all of the above answers are "NO", then may proceed with Cephalosporin use.     Level of Care/Admitting Diagnosis ED Disposition    ED Disposition Condition [facility] Area: [place] [[id number]]  Level of Care: Telemetry [5]  Covid Evaluation: Asymptomatic Screening Protocol (No Symptoms)  Diagnosis: Chest pain [403474]  Admitting Physician: Christel Mormon [[id number]]  Attending Physician: Christel Mormon [[id number]]  PT Class (Do Not Modify): Observation [104]  PT Acc Code (Do Not Modify): Observation [10022]       B Medical/Surgery History Past Medical History:  Diagnosis Date  . Anemia   . CHF (congestive heart failure) ([place])   . Diabetes mellitus without  complication ([place]) 95/18/8416   Currently no high BS.  Has come off meds.  But having episodes of low BS now.  . Dilated idiopathic cardiomyopathy ([place])   . GERD (gastroesophageal reflux disease)   . Hyperlipidemia   . Hypertension   . Hypothyroidism   . Osteoporosis   . Renal disorder    stage 3   Past Surgical History:  Procedure Laterality Date  . BUNIONECTOMY    . COLONOSCOPY    . ESOPHAGOGASTRODUODENOSCOPY (EGD) WITH PROPOFOL N/A 10/15/2017   Procedure: ESOPHAGOGASTRODUODENOSCOPY (EGD) WITH PROPOFOL;  Surgeon: Lollie Sails, MD;  Location: [facility] ENDOSCOPY;  Service: Endoscopy;  Laterality: N/A;  . KYPHOPLASTY N/A 06/12/2017   Procedure: [id number];  Surgeon: Hessie Knows, MD;  Location: [facility] ORS;  Service: Orthopedics;  Laterality: N/A;  . TONSILLECTOMY       A IV Location/Drains/Wounds Patient Lines/Drains/Airways Status   Active Line/Drains/Airways    Name:   Placement date:   Placement time:   Site:   Days:   Peripheral IV 01/05/19 Right;Posterior Forearm   01/05/19    0551    Forearm   less than 1   Incision (Closed) 06/12/17 Back Other (Comment)   06/12/17    0731     572          Intake/Output Last 24 hours No intake or output data in the 24 hours ending 01/05/19 0737  Labs/Imaging Results for orders placed or performed during the hospital encounter of 01/05/19 (from the past 48 hour(s))  Basic metabolic panel  Status: Abnormal   Collection Time: 01/05/19  5:53 AM  Result Value Ref Range   Sodium 138 135 - 145 mmol/L   Potassium 4.1 3.5 - 5.1 mmol/L    Comment: HEMOLYSIS AT THIS LEVEL MAY AFFECT RESULT   Chloride 102 98 - 111 mmol/L   CO2 24 22 - 32 mmol/L   Glucose, Bld 118 (H) 70 - 99 mg/dL   BUN 46 (H) 8 - 23 mg/dL   Creatinine, Ser 1.41 (H) 0.44 - 1.00 mg/dL   Calcium 9.2 8.9 - 10.3 mg/dL   GFR calc non Af Amer 34 (L) >60 mL/min   GFR calc Af Amer 40 (L) >60 mL/min   Anion gap 12 5 - 15    Comment: Performed at [facility], [address] [place], [place] [address]  CBC     Status: Abnormal   Collection Time: 01/05/19  5:53 AM  Result Value Ref Range   WBC 6.0 4.0 - 10.5 K/uL   RBC 4.35 3.87 - 5.11 MIL/uL   Hemoglobin 13.4 12.0 - 15.0 g/dL   HCT 40.2 36.0 - 46.0 %   MCV 92.4 80.0 - 100.0 fL   MCH 30.8 26.0 - 34.0 pg   MCHC 33.3 30.0 - 36.0 g/dL   RDW 12.6 11.5 - 15.5 %   Platelets 126 (L) 150 - 400 K/uL   nRBC 0.0 0.0 - 0.2 %    Comment: Performed at [facility], [place], [place] [address]  Troponin I (High Sensitivity)     Status: Abnormal   Collection Time: 01/05/19  5:53 AM  Result Value Ref Range   Troponin I (High Sensitivity) 40 (H) <18 ng/L    Comment: (NOTE) Elevated high sensitivity troponin I (hsTnI) values and significant  changes across serial measurements may suggest ACS but many other  chronic and acute conditions are known to elevate hsTnI results.  Refer to the "Links" section for chest pain algorithms and additional  guidance. Performed at [facility], [address], [place], [place] [address]   SARS Coronavirus 2 (CEPHEID- Performed in [facility] hospital lab), Hosp Order     Status: None   Collection Time: 01/05/19  5:59 AM   Specimen: Nasopharyngeal Swab  Result Value Ref Range   SARS Coronavirus 2 NEGATIVE NEGATIVE    Comment: (NOTE) If result is NEGATIVE SARS-CoV-2 target nucleic acids are NOT DETECTED. The SARS-CoV-2 RNA is generally detectable in upper and lower  respiratory specimens during the acute phase of infection. The lowest  concentration of SARS-CoV-2 viral copies this assay can detect is 250  copies / mL. A negative result does not preclude SARS-CoV-2 infection  and should not be used as the sole basis for treatment or other  patient management decisions.  A negative result may occur with  improper specimen collection / handling, submission of specimen other  than nasopharyngeal swab, presence of viral mutation(s) within  the  areas targeted by this assay, and inadequate number of viral copies  (<250 copies / mL). A negative result must be combined with clinical  observations, patient history, and epidemiological information. If result is POSITIVE SARS-CoV-2 target nucleic acids are DETECTED. The SARS-CoV-2 RNA is generally detectable in upper and lower  respiratory specimens dur ing the acute phase of infection.  Positive  results are indicative of active infection with SARS-CoV-2.  Clinical  correlation with patient history and other diagnostic information is  necessary to determine patient infection status.  Positive results do  not rule out bacterial infection or co-infection with other viruses. If result is PRESUMPTIVE POSTIVE SARS-CoV-2 nucleic acids MAY BE PRESENT.   A presumptive positive result was obtained on the submitted specimen  and confirmed on repeat testing.  While 2019 novel coronavirus  (SARS-CoV-2) nucleic acids may be present in the submitted sample  additional confirmatory testing may be necessary for epidemiological  and / or clinical management purposes  to differentiate between  SARS-CoV-2 and other Sarbecovirus currently known to infect humans.  If clinically indicated additional testing with an alternate test  methodology [phone]) is advised. The SARS-CoV-2 RNA is generally  detectable in upper and lower respiratory sp ecimens during the acute  phase of infection. The expected result is Negative. Fact Sheet for Patients:  StrictlyIdeas.no Fact Sheet for Healthcare Providers: BankingDealers.co.za This test is not yet approved or cleared by the [place] FDA and has been authorized for detection and/or diagnosis of SARS-CoV-2 by FDA under an Emergency Use Authorization (EUA).  This EUA will remain in effect (meaning this test can be used) for the duration of the COVID-19 declaration under Section 564(b)(1) of the Act, 21  U.S.C. section 360bbb-3(b)(1), unless the authorization is terminated or revoked sooner. Performed at [facility], [address] [place], [place] [address]    Dg Chest Port 1 View  Result Date: 01/05/2019 CLINICAL DATA:  Onset dizziness and weakness last night. EXAM: PORTABLE CHEST 1 VIEW COMPARISON:  CT chest 08/19/2018. Single-view of the chest 10/10/2017. FINDINGS: Chronic coarsening of the pulmonary interstitium is again seen. There are some Kerley B lines in the right lower lung zone suggestive of mild interstitial edema. Heart size is upper normal. No pleural effusion. Aortic atherosclerosis is noted. No acute bony abnormality. The patient is status post vertebral augmentation in the midthoracic spine. Sclerotic lesion in the proximal left humerus is likely an enchondroma and unchanged. IMPRESSION: Findings suggestive of mild interstitial pulmonary edema. Chronic coarsening of the pulmonary interstitium and pulmonary scar. Atherosclerosis. Electronically Signed   By: Inge Rise M.D.   On: 01/05/2019 07:21    Pending Labs Unresulted Labs (From admission, onward)    Start     Ordered   01/06/19 0500  Comprehensive metabolic panel  Tomorrow morning,   STAT     01/05/19 0658   01/06/19 0500  CBC with Differential/Platelet  Tomorrow morning,   STAT     01/05/19 0658   01/05/19 0708  APTT  ONCE - STAT,   STAT     01/05/19 0707   01/05/19 0708  Protime-INR  ONCE - STAT,   STAT     01/05/19 0707          Vitals/Pain Today's Vitals   01/05/19 0545 01/05/19 0550  BP:  (!) 182/77  Pulse:  77  Resp:  17  Temp:  99 F (37.2 C)  TempSrc:  Oral  SpO2:  100%  Weight: 62.1 kg   Height: 4\' 9"  (1.448 m)   PainSc: 7      Isolation Precautions Airborne and Contact precautions  Medications Medications  carvedilol (COREG) tablet 12.5 mg (has no administration in time range)  isosorbide mononitrate (IMDUR) 24 hr tablet 30 mg (has no administration in time range)   lisinopril (ZESTRIL) tablet 20 mg (has no administration in time range)  sacubitril-valsartan (ENTRESTO) 24-26 mg per tablet (has no administration in time range)  torsemide (DEMADEX) tablet 20 mg (has no administration in time range)  sertraline (ZOLOFT) tablet  50 mg (has no administration in time range)  levothyroxine (SYNTHROID) tablet 25 mcg (has no administration in time range)  pantoprazole (PROTONIX) EC tablet 40 mg (has no administration in time range)  iron polysaccharides (NIFEREX) capsule 150 mg (has no administration in time range)  ergocalciferol (VITAMIN D2) capsule 50,000 Units (has no administration in time range)  potassium chloride SA (K-DUR) CR tablet 10 mEq (has no administration in time range)  acetaminophen (TYLENOL) tablet 650 mg (has no administration in time range)  ondansetron (ZOFRAN) injection 4 mg (has no administration in time range)  0.9 %  sodium chloride infusion (has no administration in time range)  alum & mag hydroxide-simeth (MAALOX/MYLANTA) 200-200-20 MG/5ML suspension 30 mL (has no administration in time range)    And  lidocaine (XYLOCAINE) 2 % viscous mouth solution 15 mL (has no administration in time range)  metoprolol tartrate (LOPRESSOR) tablet 25 mg (has no administration in time range)  aspirin EC tablet 325 mg (has no administration in time range)  zolpidem (AMBIEN) tablet 5 mg (has no administration in time range)  atorvastatin (LIPITOR) tablet 40 mg (has no administration in time range)  heparin bolus via infusion 3,100 Units (has no administration in time range)    Followed by  heparin ADULT infusion 100 units/mL (25000 units/222mL sodium chloride 0.45%) (has no administration in time range)  aspirin chewable tablet 324 mg (324 mg Oral Given 01/05/19 7628)    Mobility walks with device High fall risk   Focused Assessments Cardiac Assessment Handoff:    Lab Results  Component Value Date   CKTOTAL 187 02/19/2013   CKMB 1.6  02/19/2013   TROPONINI 0.04 (HH) 10/10/2017   No results found for: DDIMER Does the Patient currently have chest pain? No     R Recommendations: See Admitting Provider Note  Report given to:   Additional Notes:

## 2019-01-05 NOTE — Consult Note (Signed)
ANTICOAGULATION CONSULT NOTE - Initial Consult  Pharmacy Consult for heparin infusion  Indication: chest pain/ACS  Allergies  Allergen Reactions  . Ciprofloxacin   . Digoxin And Related   . Erythromycin   . Lopid [Gemfibrozil]   . Nitrofurantoin   . Spironolactone Other (See Comments)    Hospitalized for dehydration  . Cefuroxime Rash  . Penicillins Rash    Has patient had a PCN reaction causing immediate rash, facial/tongue/throat swelling, SOB or lightheadedness with hypotension: Unknown Has patient had a PCN reaction causing severe rash involving mucus membranes or skin necrosis: No Has patient had a PCN reaction that required hospitalization: No Has patient had a PCN reaction occurring within the last 10 years: No If all of the above answers are "NO", then may proceed with Cephalosporin use.    Patient Measurements: Height: 4\' 9"  (144.8 cm) Weight: 137 lb (62.1 kg) IBW/kg (Calculated) : 38.6 Heparin Dosing Weight: 52 kg  Vital Signs: Temp: 98.1 F (36.7 C) (07/22 1520) Temp Source: Oral (07/22 1520) BP: 101/52 (07/22 1520) Pulse Rate: 63 (07/22 1520)  Labs: Recent Labs    01/05/19 0553 01/05/19 0740 01/05/19 1003 01/05/19 1233 01/05/19 1646  HGB 13.4  --   --   --   --   HCT 40.2  --   --   --   --   PLT 126*  --   --   --   --   APTT  --  36  --   --   --   LABPROT  --  13.3  --   --   --   INR  --  1.0  --   --   --   HEPARINUNFRC  --   --   --   --  0.40  CREATININE 1.41*  --   --   --   --   TROPONINIHS 40*  --  40* 37*  --     Estimated Creatinine Clearance: 22.5 mL/min (A) (by C-G formula based on SCr of 1.41 mg/dL (H)).   Medical History: Past Medical History:  Diagnosis Date  . Anemia   . CHF (congestive heart failure) ([place])   . Diabetes mellitus without complication ([place]) 50/53/9767   Currently no high BS.  Has come off meds.  But having episodes of low BS now.  . Dilated idiopathic cardiomyopathy ([place])   . GERD (gastroesophageal reflux  disease)   . Hyperlipidemia   . Hypertension   . Hypothyroidism   . Osteoporosis   . Renal disorder    stage 3    Assessment: Pharmacy consulted for heparin dosing and monitoring for 85 yo female admitted with ACS/NSTEMI. No anti-coagulants reported PTA.   7/22 HL@ 1646: 0.40. Therapeutic x1 dose. Patient is heparin infusion rate of 600 units.   Goal of Therapy:  Heparin level 0.3-0.7 units/ml Monitor platelets by anticoagulation protocol: Yes   Plan:  Baseline labs ordered  Continue heparin infusion at 600 units/hr Check anti-Xa level in 8 hours and daily while on heparin Continue to monitor H&H and platelets  Rowland Lathe, PharmD Clinical Pharmacist 01/05/2019 5:44 PM

## 2019-01-05 NOTE — ED Provider Notes (Addendum)
[facility] Emergency Department Provider Note    First MD Initiated Contact with Patient 01/05/19 [phone]     (approximate)  I have reviewed the triage vital signs and the nursing notes.   HISTORY  Chief Complaint Weakness and Dizziness    HPI Jacqueline [name] is a 85 y.o. female with below list of previous medical conditions including CHF diabetes hypertension and MI presents to the emergency department secondary to [place] nonradiating chest pain with accompanying dizziness and headache with onset last night.  Patient also admits to generalized weakness       Past Medical History:  Diagnosis Date  . Anemia   . CHF (congestive heart failure) ([place])   . Diabetes mellitus without complication ([place]) 83/41/9622   Currently no high BS.  Has come off meds.  But having episodes of low BS now.  . Dilated idiopathic cardiomyopathy ([place])   . GERD (gastroesophageal reflux disease)   . Hyperlipidemia   . Hypertension   . Hypothyroidism   . Osteoporosis   . Renal disorder    stage 3    Patient Active Problem List   Diagnosis Date Noted  . Chest pain 01/05/2019  . Hyponatremia 06/09/2017  . MI, acute, non ST segment elevation ([place]) 06/01/2017  . Acute upper back pain 03/05/2017  . Carcinoma of unknown primary ([place]) 03/05/2017  . Liver lesion 02/27/2017  . Diabetes ([place]) 02/24/2017  . Chronic systolic CHF (congestive heart failure) ([place]) 02/24/2017  . HTN (hypertension) 02/24/2017  . GERD (gastroesophageal reflux disease) 02/24/2017  . Hypothyroidism 02/24/2017  . Hypoglycemia 02/24/2017  . Dizziness 02/18/2017    Past Surgical History:  Procedure Laterality Date  . BUNIONECTOMY    . COLONOSCOPY    . ESOPHAGOGASTRODUODENOSCOPY (EGD) WITH PROPOFOL N/A 10/15/2017   Procedure: ESOPHAGOGASTRODUODENOSCOPY (EGD) WITH PROPOFOL;  Surgeon: Lollie Sails, MD;  Location: [facility] ENDOSCOPY;  Service: Endoscopy;  Laterality: N/A;  . KYPHOPLASTY N/A 06/12/2017   Procedure: [id number];  Surgeon: Hessie Knows, MD;  Location: [facility] ORS;  Service: Orthopedics;  Laterality: N/A;  . TONSILLECTOMY      Prior to Admission medications   Medication Sig Start Date End Date Taking? Authorizing Provider  alendronate (FOSAMAX) 70 MG tablet Take 70 mg by mouth every 7 (seven) days. 10/02/17   [provider]  aspirin EC 81 MG tablet Take 81 mg by mouth daily.    [provider]  atorvastatin (LIPITOR) 10 MG tablet Take 1 tablet by mouth daily. 03/01/18 03/01/19  [provider]  carvedilol (COREG) 12.5 MG tablet Take by mouth 2 (two) times daily with a meal.     [provider]  ergocalciferol (VITAMIN D2) 50000 units capsule Take 50,000 Units by mouth once a week.    [provider]  glucose blood (FREESTYLE LITE) test strip USE TO CHECK BLOOD SUGARS TWICE A DAY 02/19/16   [provider]  iron polysaccharides (NIFEREX) 150 MG capsule Take 150 mg by mouth daily.    [provider]  isosorbide mononitrate (IMDUR) 60 MG 24 hr tablet Take 0.5 tablets (30 mg total) by mouth daily. 06/05/17   Henreitta Leber, MD  KLOR-CON M10 10 MEQ tablet Take 10 mEq by mouth daily. 07/23/17   [provider]  levothyroxine (SYNTHROID, LEVOTHROID) 25 MCG tablet Take 25 mcg by mouth daily before breakfast.    [provider]  lisinopril (PRINIVIL,ZESTRIL) 20 MG tablet Take 20 mg by mouth 2 (two) times daily. 08/25/17   [provider]  pantoprazole (PROTONIX) 40 MG tablet Take 40 mg by mouth daily.    [provider]  sacubitril-valsartan (ENTRESTO) 24-26 MG Take 1 tablet by mouth every 12 (twelve) hours. 06/07/18   [provider]  sertraline (ZOLOFT) 50 MG tablet Take 50 mg by mouth daily.    [provider]  torsemide (DEMADEX) 20 MG tablet Take 1 tablet by mouth daily. 06/07/18 06/07/19  [provider]    Allergies Ciprofloxacin, Digoxin and related,  Erythromycin, Lopid [gemfibrozil], Nitrofurantoin, Spironolactone, Cefuroxime, and Penicillins  Family History  Problem Relation Age of Onset  . Premature CHD Brother     Social History Social History   Tobacco Use  . Smoking status: Former Smoker    Quit date: 04/18/1984    Years since quitting: 34.7  . Smokeless tobacco: Never Used  Substance Use Topics  . Alcohol use: No  . Drug use: No    Review of Systems Constitutional: No fever/chills Eyes: No visual changes. ENT: No sore throat. Cardiovascular: Positive for chest pain. Respiratory: Positive for shortness of breath. Gastrointestinal: No abdominal pain.  No nausea, no vomiting.  No diarrhea.  No constipation. Genitourinary: Negative for dysuria. Musculoskeletal: Negative for neck pain.  Negative for back pain. Integumentary: Negative for rash. Neurological: Negative for headaches, focal weakness or numbness.  Positive for generalized weakness all dizzy  ____________________________________________   PHYSICAL EXAM:  VITAL SIGNS: ED Triage Vitals [01/05/19 0545]  Enc Vitals Group     BP      Pulse      Resp      Temp      Temp src      SpO2      Weight 62.1 kg (137 lb)     Height 1.448 m (4\' 9" )     Head Circumference      Peak Flow      Pain Score 7     Pain Loc      Pain Edu?      Excl. in [place]?     Constitutional: Alert and oriented.  Apparent discomfort Eyes: Conjunctivae are normal. Mouth/Throat: Mucous membranes are moist. Oropharynx non-erythematous. Neck: No stridor.   Cardiovascular: Normal rate, regular rhythm. Good peripheral circulation. Grossly normal heart sounds. Respiratory: Normal respiratory effort.  No retractions. No audible wheezing. Gastrointestinal: Soft and nontender. No distention.  Musculoskeletal: No lower extremity tenderness nor edema. No gross deformities of extremities. Neurologic:  Normal speech and language. No gross focal neurologic deficits are appreciated.  Skin:   Skin is warm, dry and intact. No rash noted. Psychiatric: Mood and affect are normal. Speech and behavior are normal.  ____________________________________________   LABS (all labs ordered are listed, but only abnormal results are displayed)  Labs Reviewed  BASIC METABOLIC PANEL - Abnormal; Notable for the following components:      Result Value   Glucose, Bld 118 (*)    BUN 46 (*)    Creatinine, Ser 1.41 (*)    GFR calc non Af Amer 34 (*)    GFR calc Af Amer 40 (*)    All other components within normal limits  CBC - Abnormal; Notable for the following components:   Platelets 126 (*)    All other components within normal limits  TROPONIN I (HIGH SENSITIVITY) - Abnormal; Notable for the following components:   Troponin I (High Sensitivity) 40 (*)    All other components within normal limits  SARS CORONAVIRUS 2 (HOSPITAL ORDER, [place]  HOSPITAL LAB)   ____________________________________________  EKG  ED ECG REPORT I, [place] L[place]iya Maradiaga, the attending physician, personally viewed and interpreted this ECG.   Date: 01/05/2019  EKG Time: 5:46 AM  Rate: 80  Rhythm: Normal sinus rhythm  Axis: Normal  Intervals: Normal  ST&T Change: None   .Critical Care Performed by: Gregor Hams, MD Authorized by: Gregor Hams, MD   Critical care provider statement:    Critical care time (minutes):  30   Critical care time was exclusive of:  Separately billable procedures and treating other patients and teaching time   Critical care was necessary to treat or prevent imminent or life-threatening deterioration of the following conditions:  Circulatory failure and cardiac failure   Critical care was time spent personally by me on the following activities:  Development of treatment plan with patient or surrogate, discussions with consultants, evaluation of patient's response to treatment, examination of patient, obtaining history from patient or surrogate, ordering and  performing treatments and interventions, ordering and review of laboratory studies, ordering and review of radiographic studies, pulse oximetry, re-evaluation of patient's condition and review of old charts   I assumed direction of critical care for this patient from another provider in my specialty: no       ____________________________________________   INITIAL IMPRESSION / MDM / [place] / ED COURSE  As part of my medical decision making, I reviewed the following data within the electronic MEDICAL RECORD NUMBER  85 year old female presented with above-stated history and physical exam secondary to chest pain dizziness.  Concern for ACS/MI and as such EKG performed which which is consistent with the patient's previous EKG on file from October 10, 2017.  No evidence of infarction.  Troponin obtained which is 40.  Patient was given aspirin 3 and 24 mg.  Patient discussed with Dr.Mansy for hospital admission for further evaluation and management ____________________________________________  FINAL CLINICAL IMPRESSION(S) / ED DIAGNOSES  Final diagnoses:  NSTEMI (non-ST elevated myocardial infarction) ([place])     MEDICATIONS GIVEN DURING THIS VISIT:  Medications  aspirin EC tablet 81 mg (has no administration in time range)  carvedilol (COREG) tablet 12.5 mg (has no administration in time range)  isosorbide mononitrate (IMDUR) 24 hr tablet 30 mg (has no administration in time range)  lisinopril (ZESTRIL) tablet 20 mg (has no administration in time range)  sacubitril-valsartan (ENTRESTO) 24-26 mg per tablet (has no administration in time range)  torsemide (DEMADEX) tablet 20 mg (has no administration in time range)  sertraline (ZOLOFT) tablet 50 mg (has no administration in time range)  levothyroxine (SYNTHROID) tablet 25 mcg (has no administration in time range)  pantoprazole (PROTONIX) EC tablet 40 mg (has no administration in time range)  iron polysaccharides (NIFEREX) capsule 150  mg (has no administration in time range)  ergocalciferol (VITAMIN D2) capsule 50,000 Units (has no administration in time range)  potassium chloride SA (K-DUR) CR tablet 10 mEq (has no administration in time range)  acetaminophen (TYLENOL) tablet 650 mg (has no administration in time range)  ondansetron (ZOFRAN) injection 4 mg (has no administration in time range)  0.9 %  sodium chloride infusion (has no administration in time range)  alum & mag hydroxide-simeth (MAALOX/MYLANTA) 200-200-20 MG/5ML suspension 30 mL (has no administration in time range)    And  lidocaine (XYLOCAINE) 2 % viscous mouth solution 15 mL (has no administration in time range)  metoprolol tartrate (LOPRESSOR) tablet 25 mg (has no administration in time range)  aspirin EC tablet  325 mg (has no administration in time range)  zolpidem (AMBIEN) tablet 5 mg (has no administration in time range)  atorvastatin (LIPITOR) tablet 40 mg (has no administration in time range)  aspirin chewable tablet 324 mg (324 mg Oral Given 01/05/19 2130)     ED Discharge Orders    None      *Please note:  Van Clines was evaluated in Emergency Department on 01/05/2019 for the symptoms described in the history of present illness. She was evaluated in the context of the global COVID-19 pandemic, which necessitated consideration that the patient might be at risk for infection with the SARS-CoV-2 virus that causes COVID-19. Institutional protocols and algorithms that pertain to the evaluation of patients at risk for COVID-19 are in a state of rapid change based on information released by regulatory bodies including the CDC and federal and state organizations. These policies and algorithms were followed during the patient's care in the ED.  Some ED evaluations and interventions may be delayed as a result of limited staffing during the pandemic.*  Note:  This document was prepared using Dragon voice recognition software and may include unintentional  dictation errors.   Gregor Hams, MD 01/05/19 [id number]    Gregor Hams, MD 01/06/19 [id number]    Gregor Hams, MD 01/18/19 [phone]

## 2019-01-05 NOTE — ED Triage Notes (Signed)
Pt arrives to ED from home via [facility] with c/o dizziness and weakness that started last night. No c/o N/V/D or fever. Per EMS, pt wears 2.5L home O2 24/7. Dr Owens Shark at bedside upon pt's arrival to ED.

## 2019-01-05 NOTE — Progress Notes (Signed)
Family Meeting Note  Advance Directive:yes  Today a meeting took place with the Patient.  The following clinical team members were present during this meeting:MD  The following were discussed:Patient's diagnosis: CHF, Chest pain, Htn , Patient's progosis: Unable to determine and Goals for treatment: DNR  Patient told me in any adverse events she would like her son to make decisions for her.  She made it clear that she does not want any form of resuscitation and would like to go naturally if her heart stops  Additional follow-up to be provided: Cardiology  Time spent during discussion:20 minutes  Vaughan Basta, MD

## 2019-01-05 NOTE — H&P (Signed)
[name] NAME: Jacqueline [name]    MR#:  [id number]  DATE OF BIRTH:  09-08-1932  DATE OF ADMISSION:  01/05/2019  PRIMARY CARE PHYSICIAN: Tracie Harrier, MD   REQUESTING/REFERRING PHYSICIAN: Owens Shark  CHIEF COMPLAINT:   Chief Complaint  Patient presents with  . Weakness  . Dizziness    HISTORY OF PRESENT ILLNESS: Jacqueline [name]  is a 85 y.o. female with a known history of anemia, CHF, diabetes, dilated cardiomyopathy, hyperlipidemia, hypertension, hypothyroidism, chronic kidney disease stage III-was at her baseline until yesterday but started having chest tightness and weakness since last evening.  Today morning also when she woke up she had this problem continued so decided to come to emergency room.  She denies any associated shortness of breath or palpitation. She confirms the pain was in the center of her chest and was pressure-like.  The pain was relieved when I saw her on the floor.  PAST MEDICAL HISTORY:   Past Medical History:  Diagnosis Date  . Anemia   . CHF (congestive heart failure) ([place])   . Diabetes mellitus without complication ([place]) 67/34/1937   Currently no high BS.  Has come off meds.  But having episodes of low BS now.  . Dilated idiopathic cardiomyopathy ([place])   . GERD (gastroesophageal reflux disease)   . Hyperlipidemia   . Hypertension   . Hypothyroidism   . Osteoporosis   . Renal disorder    stage 3    PAST SURGICAL HISTORY:  Past Surgical History:  Procedure Laterality Date  . BUNIONECTOMY    . COLONOSCOPY    . ESOPHAGOGASTRODUODENOSCOPY (EGD) WITH PROPOFOL N/A 10/15/2017   Procedure: ESOPHAGOGASTRODUODENOSCOPY (EGD) WITH PROPOFOL;  Surgeon: Lollie Sails, MD;  Location: [facility] ENDOSCOPY;  Service: Endoscopy;  Laterality: N/A;  . KYPHOPLASTY N/A 06/12/2017   Procedure: [id number];  Surgeon: Hessie Knows, MD;  Location: [facility] ORS;  Service: Orthopedics;  Laterality: N/A;  . TONSILLECTOMY       SOCIAL HISTORY:  Social History   Tobacco Use  . Smoking status: Former Smoker    Quit date: 04/18/1984    Years since quitting: 34.7  . Smokeless tobacco: Never Used  Substance Use Topics  . Alcohol use: No    FAMILY HISTORY:  Family History  Problem Relation Age of Onset  . Premature CHD Brother     DRUG ALLERGIES:  Allergies  Allergen Reactions  . Ciprofloxacin   . Digoxin And Related   . Erythromycin   . Lopid [Gemfibrozil]   . Nitrofurantoin   . Spironolactone Other (See Comments)    Hospitalized for dehydration  . Cefuroxime Rash  . Penicillins Rash    Has patient had a PCN reaction causing immediate rash, facial/tongue/throat swelling, SOB or lightheadedness with hypotension: Unknown Has patient had a PCN reaction causing severe rash involving mucus membranes or skin necrosis: No Has patient had a PCN reaction that required hospitalization: No Has patient had a PCN reaction occurring within the last 10 years: No If all of the above answers are "NO", then may proceed with Cephalosporin use.     REVIEW OF SYSTEMS:   CONSTITUTIONAL: No fever, fatigue or weakness.  EYES: No blurred or double vision.  EARS, NOSE, AND THROAT: No tinnitus or ear pain.  RESPIRATORY: No cough, shortness of breath, wheezing or hemoptysis.  CARDIOVASCULAR: had chest pain, no orthopnea, edema.  GASTROINTESTINAL: No nausea, vomiting, diarrhea or abdominal pain.  GENITOURINARY: No dysuria, hematuria.  ENDOCRINE: No polyuria,  nocturia,  HEMATOLOGY: No anemia, easy bruising or bleeding SKIN: No rash or lesion. MUSCULOSKELETAL: No joint pain or arthritis.   NEUROLOGIC: No tingling, numbness, weakness.  PSYCHIATRY: No anxiety or depression.   MEDICATIONS AT HOME:  Prior to Admission medications   Medication Sig Start Date End Date Taking? Authorizing Provider  alendronate (FOSAMAX) 70 MG tablet Take 70 mg by mouth every 7 (seven) days. 10/02/17  Yes [provider]  aspirin  EC 81 MG tablet Take 81 mg by mouth daily.   Yes [provider]  atorvastatin (LIPITOR) 10 MG tablet Take 1 tablet by mouth daily. 03/01/18 03/01/19 Yes [provider]  carvedilol (COREG) 12.5 MG tablet Take by mouth 2 (two) times daily with a meal.    Yes [provider]  ergocalciferol (VITAMIN D2) 50000 units capsule Take 50,000 Units by mouth once a week.   Yes [provider]  glucose blood (FREESTYLE LITE) test strip USE TO CHECK BLOOD SUGARS TWICE A DAY 02/19/16  Yes [provider]  iron polysaccharides (NIFEREX) 150 MG capsule Take 150 mg by mouth daily.   Yes [provider]  isosorbide mononitrate (IMDUR) 60 MG 24 hr tablet Take 0.5 tablets (30 mg total) by mouth daily. 06/05/17  Yes Sainani, Belia Heman, MD  KLOR-CON M10 10 MEQ tablet Take 10 mEq by mouth daily. 07/23/17  Yes [provider]  levothyroxine (SYNTHROID, LEVOTHROID) 25 MCG tablet Take 25 mcg by mouth daily before breakfast.   Yes [provider]  lisinopril (PRINIVIL,ZESTRIL) 20 MG tablet Take 20 mg by mouth 2 (two) times daily. 08/25/17  Yes [provider]  pantoprazole (PROTONIX) 40 MG tablet Take 40 mg by mouth daily.   Yes [provider]  sacubitril-valsartan (ENTRESTO) 24-26 MG Take 1 tablet by mouth every 12 (twelve) hours. 06/07/18  Yes [provider]  sertraline (ZOLOFT) 50 MG tablet Take 50 mg by mouth daily.   Yes [provider]  torsemide (DEMADEX) 20 MG tablet Take 1 tablet by mouth daily. 06/07/18 06/07/19 Yes [provider]      PHYSICAL EXAMINATION:   VITAL SIGNS: Blood pressure (!) 101/52, pulse 63, temperature 98.1 F (36.7 C), temperature source Oral, resp. rate 19, height 4\' 9"  (1.448 m), weight 62.1 kg, SpO2 96 %.  GENERAL:  85 y.o.-year-old patient lying in the bed with no acute distress.  EYES: Pupils equal, round, reactive to light and accommodation. No scleral icterus. Extraocular  muscles intact.  HEENT: Head atraumatic, normocephalic. Oropharynx and nasopharynx clear.  NECK:  Supple, no jugular venous distention. No thyroid enlargement, no tenderness.  LUNGS: Normal breath sounds bilaterally, no wheezing, rales,rhonchi or crepitation. No use of accessory muscles of respiration.  CARDIOVASCULAR: S1, S2 normal. No murmurs, rubs, or gallops.  ABDOMEN: Soft, nontender, nondistended. Bowel sounds present. No organomegaly or mass.  EXTREMITIES: No pedal edema, cyanosis, or clubbing.  NEUROLOGIC: Cranial nerves II through XII are intact. Muscle strength 4/5 in all extremities. Sensation intact. Gait not checked.  PSYCHIATRIC: The patient is alert and oriented x 3.  SKIN: No obvious rash, lesion, or ulcer.   LABORATORY PANEL:   CBC Recent Labs  Lab 01/05/19 0553  WBC 6.0  HGB 13.4  HCT 40.2  PLT 126*  MCV 92.4  MCH 30.8  MCHC 33.3  RDW 12.6   ------------------------------------------------------------------------------------------------------------------  Chemistries  Recent Labs  Lab 01/05/19 0553  NA 138  K 4.1  CL 102  CO2 24  GLUCOSE 118*  BUN 46*  CREATININE 1.41*  CALCIUM 9.2   ------------------------------------------------------------------------------------------------------------------ estimated creatinine clearance is 22.5 mL/min (A) (by C-G formula based on SCr of 1.41 mg/dL (H)). ------------------------------------------------------------------------------------------------------------------ No results for input(s): TSH, T4TOTAL, T3FREE, THYROIDAB in the last 72 hours.  Invalid input(s): FREET3   Coagulation profile Recent Labs  Lab 01/05/19 0740  INR 1.0   ------------------------------------------------------------------------------------------------------------------- No results for input(s): DDIMER in the last 72  hours. -------------------------------------------------------------------------------------------------------------------  Cardiac Enzymes No results for input(s): CKMB, TROPONINI, MYOGLOBIN in the last 168 hours.  Invalid input(s): CK ------------------------------------------------------------------------------------------------------------------ Invalid input(s): POCBNP  ---------------------------------------------------------------------------------------------------------------  Urinalysis    Component Value Date/Time   COLORURINE YELLOW (A) 01/14/2018 1205   APPEARANCEUR HAZY (A) 01/14/2018 1205   APPEARANCEUR Clear 06/13/2014 2201   LABSPEC 1.012 01/14/2018 1205   LABSPEC 1.008 06/13/2014 2201   PHURINE 5.0 01/14/2018 1205   GLUCOSEU NEGATIVE 01/14/2018 1205   GLUCOSEU 150 mg/dL 06/13/2014 2201   HGBUR NEGATIVE 01/14/2018 1205   BILIRUBINUR NEGATIVE 01/14/2018 1205   BILIRUBINUR Negative 06/13/2014 2201   KETONESUR NEGATIVE 01/14/2018 1205   PROTEINUR 100 (A) 01/14/2018 1205   NITRITE NEGATIVE 01/14/2018 1205   LEUKOCYTESUR NEGATIVE 01/14/2018 1205   LEUKOCYTESUR Trace 06/13/2014 2201     RADIOLOGY: Ct Head Wo Contrast  Result Date: 01/05/2019 CLINICAL DATA:  Dizziness, headache, chest pain EXAM: CT HEAD WITHOUT CONTRAST TECHNIQUE: Contiguous axial images were obtained from the base of the skull through the vertex without intravenous contrast. COMPARISON:  01/25/2011 FINDINGS: Brain: There is atrophy and chronic small vessel disease changes. No acute intracranial abnormality. Specifically, no hemorrhage, hydrocephalus, mass lesion, acute infarction, or significant intracranial injury. Vascular: No hyperdense vessel or unexpected calcification. Skull: No acute calvarial abnormality. Sinuses/Orbits: Visualized paranasal sinuses and mastoids clear. Orbital soft tissues unremarkable. Other: None IMPRESSION: Atrophy, chronic microvascular disease. No acute intracranial  abnormality. Electronically Signed   By: Rolm Baptise M.D.   On: 01/05/2019 08:13   Dg Chest Port 1 View  Result Date: 01/05/2019 CLINICAL DATA:  Onset dizziness and weakness last night. EXAM: PORTABLE CHEST 1 VIEW COMPARISON:  CT chest 08/19/2018. Single-view of the chest 10/10/2017. FINDINGS: Chronic coarsening of the pulmonary interstitium is again seen. There are some Kerley B lines in the right lower lung zone suggestive of mild interstitial edema. Heart size is upper normal. No pleural effusion. Aortic atherosclerosis is noted. No acute bony abnormality. The patient is status post vertebral augmentation in the midthoracic spine. Sclerotic lesion in the proximal left humerus is likely an enchondroma and unchanged. IMPRESSION: Findings suggestive of mild interstitial pulmonary edema. Chronic coarsening of the pulmonary interstitium and pulmonary scar. Atherosclerosis. Electronically Signed   By: Inge Rise M.D.   On: 01/05/2019 07:21    EKG: Orders placed or performed during the hospital encounter of 01/05/19  . EKG 12-Lead  . EKG 12-Lead  . ED EKG  . ED EKG  . EKG 12-Lead  . EKG 12-Lead  . EKG 12-Lead (recurrent chest pain)  . EKG 12-Lead (on arrival)  . EKG 12-Lead (on arrival)    IMPRESSION AND PLAN:  * Chest pain As per her description it seems to be anginal chest pain but her troponins are not very impressive.  She was started on heparin IV drip already I would follow serial troponin and get echocardiogram and wait for cardiology consult to decide further management  She is already on aspirin, carvedilol, atorvastatin, isosorbide-we will continue that.  *Chronic systolic congestive heart failure Patient is not in any acute exacerbation She is already taking  Entresto and torsemide, continue the same.  *Hypertension Continue Coreg, torsemide, Entresto, isosorbide  *Hypothyroidism Continue levothyroxine.  *Acute renal failure on chronic kidney disease stage  II Creatinine is slightly worse on presentation today, gentle IV hydration and watch for fluid overload.  All the records are reviewed and case discussed with ED provider. Management plans discussed with the patient, family and they are in agreement.  CODE STATUS: DNR.    Code Status Orders  (From admission, onward)         Start     Ordered   01/05/19 0651  Full code  Continuous     01/05/19 0655        Code Status History    Date Active Date Inactive Code Status Order ID Comments User Context   06/09/2017 0008 06/15/2017 1819 Full Code [id number]  Max Sane, MD ED   06/01/2017 2310 06/04/2017 1803 DNR [id number]  Salary, Avel Peace, MD Inpatient   02/25/2017 0000 02/26/2017 0245 Full Code [id number]  Lance Coon, MD ED   02/18/2017 1620 02/20/2017 1854 Full Code [id number]  Hillary Bow, MD ED   Advance Care Planning Activity       TOTAL TIME TAKING CARE OF THIS PATIENT: 50 minutes.    Vaughan Basta M.D on 01/05/2019   Between 7am to 6pm - Pager - [phone]  After 6pm go to [email or web address] - password [email or web address] [facility]  Office  [phone]  CC: Primary care physician; Tracie Harrier, MD   Note: This dictation was prepared with Dragon dictation along with smaller phrase technology. Any transcriptional errors that result from this process are unintentional.

## 2019-01-06 LAB — COMPREHENSIVE METABOLIC PANEL
ALT: 22 U/L (ref 0–44)
AST: 24 U/L (ref 15–41)
Albumin: 4 g/dL (ref 3.5–5.0)
Alkaline Phosphatase: 40 U/L (ref 38–126)
Anion gap: 6 (ref 5–15)
BUN: 37 mg/dL — ABNORMAL HIGH (ref 8–23)
CO2: 27 mmol/L (ref 22–32)
Calcium: 8.5 mg/dL — ABNORMAL LOW (ref 8.9–10.3)
Chloride: 108 mmol/L (ref 98–111)
Creatinine, Ser: 1.04 mg/dL — ABNORMAL HIGH (ref 0.44–1.00)
GFR calc Af Amer: 57 mL/min — ABNORMAL LOW (ref >60)
GFR calc non Af Amer: 49 mL/min — ABNORMAL LOW (ref >60)
Glucose, Bld: 123 mg/dL — ABNORMAL HIGH (ref 70–99)
Potassium: 3.8 mmol/L (ref 3.5–5.1)
Sodium: 141 mmol/L (ref 135–145)
Total Bilirubin: 0.6 mg/dL (ref 0.3–1.2)
Total Protein: 6.6 g/dL (ref 6.5–8.1)

## 2019-01-06 LAB — CBC WITH DIFFERENTIAL/PLATELET
Abs Immature Granulocytes: 0.01 10*3/uL (ref 0.00–0.07)
Basophils Absolute: 0 10*3/uL (ref 0.0–0.1)
Basophils Relative: 0 %
Eosinophils Absolute: 0.2 10*3/uL (ref 0.0–0.5)
Eosinophils Relative: 4 %
HCT: 35.6 % — ABNORMAL LOW (ref 36.0–46.0)
Hemoglobin: 12.2 g/dL (ref 12.0–15.0)
Immature Granulocytes: 0 %
Lymphocytes Relative: 27 %
Lymphs Abs: 1.2 10*3/uL (ref 0.7–4.0)
MCH: 31.2 pg (ref 26.0–34.0)
MCHC: 34.3 g/dL (ref 30.0–36.0)
MCV: 91 fL (ref 80.0–100.0)
Monocytes Absolute: 0.4 10*3/uL (ref 0.1–1.0)
Monocytes Relative: 8 %
Neutro Abs: 2.8 10*3/uL (ref 1.7–7.7)
Neutrophils Relative %: 61 %
Platelets: 115 10*3/uL — ABNORMAL LOW (ref 150–400)
RBC: 3.91 MIL/uL (ref 3.87–5.11)
RDW: 12.6 % (ref 11.5–15.5)
WBC: 4.6 10*3/uL (ref 4.0–10.5)
nRBC: 0 % (ref 0.0–0.2)

## 2019-01-06 LAB — HEPARIN LEVEL (UNFRACTIONATED)
Heparin Unfractionated: 0.2 IU/mL — ABNORMAL LOW (ref 0.30–0.70)
Heparin Unfractionated: 0.5 IU/mL (ref 0.30–0.70)
Heparin Unfractionated: 0.57 IU/mL (ref 0.30–0.70)

## 2019-01-06 LAB — ECHOCARDIOGRAM COMPLETE
Height: 57 in
Weight: 2192 oz

## 2019-01-06 MED ORDER — SODIUM CHLORIDE 0.9 % IV SOLN
INTRAVENOUS | Status: DC
  Administered 2019-01-06 – 2019-01-07 (×2): via INTRAVENOUS

## 2019-01-06 MED ORDER — HEPARIN BOLUS VIA INFUSION
750.0000 [IU] | Freq: Once | INTRAVENOUS | Status: AC
  Administered 2019-01-06: 750 [IU] via INTRAVENOUS
  Filled 2019-01-06: qty 750

## 2019-01-06 MED ORDER — SODIUM CHLORIDE 0.9% FLUSH: 3.0000 mL | INTRAVENOUS | Status: DC | PRN

## 2019-01-06 MED ORDER — SODIUM CHLORIDE 0.9% FLUSH
3.0000 mL | Freq: Two times a day (BID) | INTRAVENOUS | Status: DC
  Administered 2019-01-06 – 2019-01-08 (×4): 3 mL via INTRAVENOUS

## 2019-01-06 NOTE — Discharge Summary (Signed)
[facility] [place] NAME: Jacqueline [name]    MR#:  [id number]  DATE OF BIRTH:  1933-04-05  DATE OF ADMISSION:  01/05/2019   ADMITTING PHYSICIAN: Jacqueline Mormon, MD  DATE OF DISCHARGE: 01/06/19  PRIMARY CARE PHYSICIAN: Jacqueline Harrier, MD   ADMISSION DIAGNOSIS:  Elevated troponin DISCHARGE DIAGNOSIS:  Active Problems:   Chest pain  SECONDARY DIAGNOSIS:   Past Medical History:  Diagnosis Date   Anemia    CHF (congestive heart failure) ([facility])    Diabetes mellitus without complication ([place]) 50/02/3817   Currently no high BS.  Has come off meds.  But having episodes of low BS now.   Dilated idiopathic cardiomyopathy ([facility])    GERD (gastroesophageal reflux disease)    Hyperlipidemia    Hypertension    Hypothyroidism    Osteoporosis    Renal disorder    stage 3   HOSPITAL COURSE:   Jacqueline [name] is an 85 year old female who presented to the ED with chest pain and weakness.  She was started on a heparin drip by the ED physician.  Troponins were 40 > 40 > 37. ECHO showed EF 55-60% and impaired relaxation.  Cardiology was consulted and did not recommend any further inpatient work-up.  Patient was advised to follow-up with her cardiologist in 1 to 2 weeks as an outpatient.  DISCHARGE CONDITIONS:  Chest pain-resolved Chronic diastolic CHF Type 2 diabetes Hypertension Hyperlipidemia Hypothyroidism Osteoporosis CKD 3 CONSULTS OBTAINED:  Cardiology DRUG ALLERGIES:   Allergies  Allergen Reactions   Ciprofloxacin    Digoxin And Related    Erythromycin    Lopid [Gemfibrozil]    Nitrofurantoin    Spironolactone Other (See Comments)    Hospitalized for dehydration   Cefuroxime Rash   Penicillins Rash    Has patient had a PCN reaction causing immediate rash, facial/tongue/throat swelling, SOB or lightheadedness with hypotension: Unknown Has patient had a PCN reaction causing severe rash involving mucus membranes or skin  necrosis: No Has patient had a PCN reaction that required hospitalization: No Has patient had a PCN reaction occurring within the last 10 years: No If all of the above answers are "NO", then may proceed with Cephalosporin use.    DISCHARGE MEDICATIONS:   Allergies as of 01/06/2019      Reactions   Ciprofloxacin    Digoxin And Related    Erythromycin    Lopid [gemfibrozil]    Nitrofurantoin    Spironolactone Other (See Comments)   Hospitalized for dehydration   Cefuroxime Rash   Penicillins Rash   Has patient had a PCN reaction causing immediate rash, facial/tongue/throat swelling, SOB or lightheadedness with hypotension: Unknown Has patient had a PCN reaction causing severe rash involving mucus membranes or skin necrosis: No Has patient had a PCN reaction that required hospitalization: No Has patient had a PCN reaction occurring within the last 10 years: No If all of the above answers are "NO", then may proceed with Cephalosporin use.      Medication List    STOP taking these medications   lisinopril 20 MG tablet Commonly known as: ZESTRIL     TAKE these medications   alendronate 70 MG tablet Commonly known as: FOSAMAX Take 70 mg by mouth every 7 (seven) days.   aspirin EC 81 MG tablet Take 81 mg by mouth daily.   atorvastatin 10 MG tablet Commonly known as: LIPITOR Take 1 tablet by mouth daily.   carvedilol 12.5 MG tablet Commonly known  as: COREG Take by mouth 2 (two) times daily with a meal.   ergocalciferol 1.25 MG (50000 UT) capsule Commonly known as: VITAMIN D2 Take 50,000 Units by mouth once a week.   FREESTYLE LITE test strip Generic drug: glucose blood USE TO CHECK BLOOD SUGARS TWICE A DAY   iron polysaccharides 150 MG capsule Commonly known as: NIFEREX Take 150 mg by mouth daily.   isosorbide mononitrate 60 MG 24 hr tablet Commonly known as: IMDUR Take 0.5 tablets (30 mg total) by mouth daily.   Klor-Con M10 10 MEQ tablet Generic drug:  potassium chloride Take 10 mEq by mouth daily.   levothyroxine 25 MCG tablet Commonly known as: SYNTHROID Take 25 mcg by mouth daily before breakfast.   pantoprazole 40 MG tablet Commonly known as: PROTONIX Take 40 mg by mouth daily.   sacubitril-valsartan 24-26 MG Commonly known as: ENTRESTO Take 1 tablet by mouth every 12 (twelve) hours.   sertraline 50 MG tablet Commonly known as: ZOLOFT Take 50 mg by mouth daily.   torsemide 20 MG tablet Commonly known as: DEMADEX Take 1 tablet by mouth daily.        DISCHARGE INSTRUCTIONS:  1.  Follow-up with PCP in 5 days 2.  Follow with cardiology in 1 to 2 weeks DIET:  Cardiac diet and Diabetic diet DISCHARGE CONDITION:  Stable ACTIVITY:  Activity as tolerated OXYGEN:  Home Oxygen: Yes.    Oxygen Delivery: 2 liters/min via Patient connected to nasal cannula oxygen DISCHARGE LOCATION:  home   If you experience worsening of your admission symptoms, develop shortness of breath, life threatening emergency, suicidal or homicidal thoughts you must seek medical attention immediately by calling 911 or calling your MD immediately  if symptoms less severe.  You Must read complete instructions/literature along with all the possible adverse reactions/side effects for all the Medicines you take and that have been prescribed to you. Take any new Medicines after you have completely understood and accpet all the possible adverse reactions/side effects.   Please note  You were cared for by a hospitalist during your hospital stay. If you have any questions about your discharge medications or the care you received while you were in the hospital after you are discharged, you can call the unit and asked to speak with the hospitalist on call if the hospitalist that took care of you is not available. Once you are discharged, your primary care physician will handle any further medical issues. Please note that NO REFILLS for any discharge medications  will be authorized once you are discharged, as it is imperative that you return to your primary care physician (or establish a relationship with a primary care physician if you do not have one) for your aftercare needs so that they can reassess your need for medications and monitor your lab values.    On the day of Discharge:  VITAL SIGNS:  Blood pressure (!) 168/62, pulse 65, temperature 97.8 F (36.6 C), temperature source Oral, resp. rate 19, height 4\' 9"  (1.448 m), weight 62.1 kg, SpO2 98 %. PHYSICAL EXAMINATION:  GENERAL:  85 y.o.-year-old patient lying in the bed with no acute distress.  EYES: Pupils equal, round, reactive to light and accommodation. No scleral icterus. Extraocular muscles intact.  HEENT: Head atraumatic, normocephalic. Oropharynx and nasopharynx clear.  NECK:  Supple, no jugular venous distention. No thyroid enlargement, no tenderness.  LUNGS: Normal breath sounds bilaterally, no wheezing, rales,rhonchi or crepitation. No use of accessory muscles of respiration.  CARDIOVASCULAR: S1, S2  normal. No murmurs, rubs, or gallops.  ABDOMEN: Soft, non-tender, non-distended. Bowel sounds present. No organomegaly or mass.  EXTREMITIES: No pedal edema, cyanosis, or clubbing.  NEUROLOGIC: Cranial nerves II through XII are intact. Muscle strength 5/5 in all extremities. Sensation intact. Gait not checked.  PSYCHIATRIC: The patient is alert and oriented x 3.  SKIN: No obvious rash, lesion, or ulcer.  DATA REVIEW:   CBC Recent Labs  Lab 01/06/19 0110  WBC 4.6  HGB 12.2  HCT 35.6*  PLT 115*    Chemistries  Recent Labs  Lab 01/06/19 0110  NA 141  K 3.8  CL 108  CO2 27  GLUCOSE 123*  BUN 37*  CREATININE 1.04*  CALCIUM 8.5*  AST 24  ALT 22  ALKPHOS 40  BILITOT 0.6     Microbiology Results  Results for orders placed or performed during the hospital encounter of 01/05/19  SARS Coronavirus 2 (CEPHEID- Performed in [place] hospital lab), Hosp Order     Status:  None   Collection Time: 01/05/19  5:59 AM   Specimen: Nasopharyngeal Swab  Result Value Ref Range Status   SARS Coronavirus 2 NEGATIVE NEGATIVE Final    Comment: (NOTE) If result is NEGATIVE SARS-CoV-2 target nucleic acids are NOT DETECTED. The SARS-CoV-2 RNA is generally detectable in upper and lower  respiratory specimens during the acute phase of infection. The lowest  concentration of SARS-CoV-2 viral copies this assay can detect is 250  copies / mL. A negative result does not preclude SARS-CoV-2 infection  and should not be used as the sole basis for treatment or other  patient management decisions.  A negative result may occur with  improper specimen collection / handling, submission of specimen other  than nasopharyngeal swab, presence of viral mutation(s) within the  areas targeted by this assay, and inadequate number of viral copies  (<250 copies / mL). A negative result must be combined with clinical  observations, patient history, and epidemiological information. If result is POSITIVE SARS-CoV-2 target nucleic acids are DETECTED. The SARS-CoV-2 RNA is generally detectable in upper and lower  respiratory specimens dur ing the acute phase of infection.  Positive  results are indicative of active infection with SARS-CoV-2.  Clinical  correlation with patient history and other diagnostic information is  necessary to determine patient infection status.  Positive results do  not rule out bacterial infection or co-infection with other viruses. If result is PRESUMPTIVE POSTIVE SARS-CoV-2 nucleic acids MAY BE PRESENT.   A presumptive positive result was obtained on the submitted specimen  and confirmed on repeat testing.  While 2019 novel coronavirus  (SARS-CoV-2) nucleic acids may be present in the submitted sample  additional confirmatory testing may be necessary for epidemiological  and / or clinical management purposes  to differentiate between  SARS-CoV-2 and other  Sarbecovirus currently known to infect humans.  If clinically indicated additional testing with an alternate test  methodology [id number]) is advised. The SARS-CoV-2 RNA is generally  detectable in upper and lower respiratory sp ecimens during the acute  phase of infection. The expected result is Negative. Fact Sheet for Patients:  StrictlyIdeas.no Fact Sheet for Healthcare Providers: BankingDealers.co.za This test is not yet approved or cleared by the [place] FDA and has been authorized for detection and/or diagnosis of SARS-CoV-2 by FDA under an Emergency Use Authorization (EUA).  This EUA will remain in effect (meaning this test can be used) for the duration of the COVID-19 declaration under Section 564(b)(1) of the Act,  21 U.S.C. section 360bbb-3(b)(1), unless the authorization is terminated or revoked sooner. Performed at [facility], [address] [place], [place] [address]     RADIOLOGY:  No results found.   Management plans discussed with the patient, family and they are in agreement.  CODE STATUS: Full Code   TOTAL TIME TAKING CARE OF THIS PATIENT: 45 minutes.    Berna Spare Samarra Ridgely M.D on 01/06/2019 at 3:47 PM  Between 7am to 6pm - Pager [phone]  After 6pm go to [email or web address] - Proofreader  [facility]  Office  [phone]  CC: Primary care physician; Jacqueline Harrier, MD   Note: This dictation was prepared with Dragon dictation along with smaller phrase technology. Any transcriptional errors that result from this process are unintentional.

## 2019-01-06 NOTE — Care Management Obs Status (Signed)
MEDICARE OBSERVATION STATUS NOTIFICATION   Patient Details  Name: Jacqueline [name] MRN: [id number] Date of Birth: May 31, 1933   Medicare Observation Status Notification Given:  Yes  Also explained the notice with patient's daughter [name] over the phone  Katrina Stack, RN 01/06/2019, 9:29 AM

## 2019-01-06 NOTE — Consult Note (Signed)
ANTICOAGULATION CONSULT NOTE - Initial Consult  Pharmacy Consult for heparin infusion  Indication: chest pain/ACS  Allergies  Allergen Reactions  . Ciprofloxacin   . Digoxin And Related   . Erythromycin   . Lopid [Gemfibrozil]   . Nitrofurantoin   . Spironolactone Other (See Comments)    Hospitalized for dehydration  . Cefuroxime Rash  . Penicillins Rash    Has patient had a PCN reaction causing immediate rash, facial/tongue/throat swelling, SOB or lightheadedness with hypotension: Unknown Has patient had a PCN reaction causing severe rash involving mucus membranes or skin necrosis: No Has patient had a PCN reaction that required hospitalization: No Has patient had a PCN reaction occurring within the last 10 years: No If all of the above answers are "NO", then may proceed with Cephalosporin use.    Patient Measurements: Height: 4\' 9"  (144.8 cm) Weight: 137 lb (62.1 kg) IBW/kg (Calculated) : 38.6 Heparin Dosing Weight: 52 kg  Vital Signs: Temp: 97.8 F (36.6 C) (07/23 0756) Temp Source: Oral (07/23 0756) BP: 168/62 (07/23 0756) Pulse Rate: 65 (07/23 0756)  Labs: Recent Labs    01/05/19 0553 01/05/19 0740 01/05/19 1003 01/05/19 1233  01/06/19 0110 01/06/19 0829 01/06/19 1505  HGB 13.4  --   --   --   --  12.2  --   --   HCT 40.2  --   --   --   --  35.6*  --   --   PLT 126*  --   --   --   --  115*  --   --   APTT  --  36  --   --   --   --   --   --   LABPROT  --  13.3  --   --   --   --   --   --   INR  --  1.0  --   --   --   --   --   --   HEPARINUNFRC  --   --   --   --    < > 0.20* 0.50 0.57  CREATININE 1.41*  --   --   --   --  1.04*  --   --   TROPONINIHS 40*  --  40* 37*  --   --   --   --    < > = values in this interval not displayed.    Estimated Creatinine Clearance: 30.5 mL/min (A) (by C-G formula based on SCr of 1.04 mg/dL (H)).   Medical History: Past Medical History:  Diagnosis Date  . Anemia   . CHF (congestive heart failure) ([place])   .  Diabetes mellitus without complication ([place]) 27/78/2423   Currently no high BS.  Has come off meds.  But having episodes of low BS now.  . Dilated idiopathic cardiomyopathy ([place])   . GERD (gastroesophageal reflux disease)   . Hyperlipidemia   . Hypertension   . Hypothyroidism   . Osteoporosis   . Renal disorder    stage 3    Assessment: Pharmacy consulted for heparin dosing and monitoring for 85 yo female admitted with ACS/NSTEMI. No anti-coagulants reported PTA.   7/22 HL@ 1646: 0.40. Therapeutic x1 dose. Patient is heparin infusion rate of 600 units.  7/22 @ 0110 HL: 0.20 750 unit bolus and increase heparin infusion to 700 units/hr 7/23 @ 0829 HL 0.5 7/23 @ 1505 HL 0.57     Goal of Therapy:  Heparin level 0.3-0.7 units/ml Monitor platelets by anticoagulation protocol: Yes   Plan:  Heparin level is therapeutic x 2.  Continue heparin infusion at 700 units/hr.  Recheck anti-Xa level in the AM lab.   Continue to monitor H&H and platelets  Oswald Hillock, PharmD, BCPS Clinical Pharmacist 01/06/2019 3:26 PM

## 2019-01-06 NOTE — Consult Note (Signed)
Reason for Consult: Chest pain borderline troponins history of congestive heart failure Referring Physician: Dr. Anselm Jungling hospitalist Dr. Agnes Lawrence primary Cardiologist Dr. Eldridge [name] is an 85 y.o. female.  HPI: Patient is a 85 year old Asian female with history of anemia congestive heart failure dilated cardiomyopathy diabetes hypertension hyperlipidemia chronic renal sufficiency.  Patient presented with vertigo vague chest discomfort vertigo she denied any significant shortness of breath or palpitation she complained of a discomfort in the center of her chest patient was pain-free by the time I saw her only complaint of vertigo no fever chills or sweats no sputum production  Past Medical History:  Diagnosis Date  . Anemia   . CHF (congestive heart failure) ([place])   . Diabetes mellitus without complication ([place]) 17/61/6073   Currently no high BS.  Has come off meds.  But having episodes of low BS now.  . Dilated idiopathic cardiomyopathy ([name])   . GERD (gastroesophageal reflux disease)   . Hyperlipidemia   . Hypertension   . Hypothyroidism   . Osteoporosis   . Renal disorder    stage 3    Past Surgical History:  Procedure Laterality Date  . BUNIONECTOMY    . COLONOSCOPY    . ESOPHAGOGASTRODUODENOSCOPY (EGD) WITH PROPOFOL N/A 10/15/2017   Procedure: ESOPHAGOGASTRODUODENOSCOPY (EGD) WITH PROPOFOL;  Surgeon: Lollie Sails, MD;  Location: [facility] ENDOSCOPY;  Service: Endoscopy;  Laterality: N/A;  . KYPHOPLASTY N/A 06/12/2017   Procedure: [id number];  Surgeon: Hessie Knows, MD;  Location: [facility] ORS;  Service: Orthopedics;  Laterality: N/A;  . TONSILLECTOMY      Family History  Problem Relation Age of Onset  . Premature CHD Brother     Social History:  reports that she quit smoking about 34 years ago. She has never used smokeless tobacco. She reports that she does not drink alcohol or use drugs.  Allergies:  Allergies  Allergen Reactions  . Ciprofloxacin   .  Digoxin And Related   . Erythromycin   . Lopid [Gemfibrozil]   . Nitrofurantoin   . Spironolactone Other (See Comments)    Hospitalized for dehydration  . Cefuroxime Rash  . Penicillins Rash    Has patient had a PCN reaction causing immediate rash, facial/tongue/throat swelling, SOB or lightheadedness with hypotension: Unknown Has patient had a PCN reaction causing severe rash involving mucus membranes or skin necrosis: No Has patient had a PCN reaction that required hospitalization: No Has patient had a PCN reaction occurring within the last 10 years: No If all of the above answers are "NO", then may proceed with Cephalosporin use.     Medications: I have reviewed the patient's current medications.  Results for orders placed or performed during the hospital encounter of 01/05/19 (from the past 48 hour(s))  Basic metabolic panel     Status: Abnormal   Collection Time: 01/05/19  5:53 AM  Result Value Ref Range   Sodium 138 135 - 145 mmol/L   Potassium 4.1 3.5 - 5.1 mmol/L    Comment: HEMOLYSIS AT THIS LEVEL MAY AFFECT RESULT   Chloride 102 98 - 111 mmol/L   CO2 24 22 - 32 mmol/L   Glucose, Bld 118 (H) 70 - 99 mg/dL   BUN 46 (H) 8 - 23 mg/dL   Creatinine, Ser 1.41 (H) 0.44 - 1.00 mg/dL   Calcium 9.2 8.9 - 10.3 mg/dL   GFR calc non Af Amer 34 (L) >60 mL/min   GFR calc Af Amer 40 (L) >60 mL/min   Anion  gap 12 5 - 15    Comment: Performed at [facility], [address] [place], [place] [address]  CBC     Status: Abnormal   Collection Time: 01/05/19  5:53 AM  Result Value Ref Range   WBC 6.0 4.0 - 10.5 K/uL   RBC 4.35 3.87 - 5.11 MIL/uL   Hemoglobin 13.4 12.0 - 15.0 g/dL   HCT 40.2 36.0 - 46.0 %   MCV 92.4 80.0 - 100.0 fL   MCH 30.8 26.0 - 34.0 pg   MCHC 33.3 30.0 - 36.0 g/dL   RDW 12.6 11.5 - 15.5 %   Platelets 126 (L) 150 - 400 K/uL   nRBC 0.0 0.0 - 0.2 %    Comment: Performed at [facility], [place], [place] [address]  Troponin I  (High Sensitivity)     Status: Abnormal   Collection Time: 01/05/19  5:53 AM  Result Value Ref Range   Troponin I (High Sensitivity) 40 (H) <18 ng/L    Comment: (NOTE) Elevated high sensitivity troponin I (hsTnI) values and significant  changes across serial measurements may suggest ACS but many other  chronic and acute conditions are known to elevate hsTnI results.  Refer to the "Links" section for chest pain algorithms and additional  guidance. Performed at [facility], [address], [place], [place] [address]   SARS Coronavirus 2 (CEPHEID- Performed in [facility] hospital lab), Hosp Order     Status: None   Collection Time: 01/05/19  5:59 AM   Specimen: Nasopharyngeal Swab  Result Value Ref Range   SARS Coronavirus 2 NEGATIVE NEGATIVE    Comment: (NOTE) If result is NEGATIVE SARS-CoV-2 target nucleic acids are NOT DETECTED. The SARS-CoV-2 RNA is generally detectable in upper and lower  respiratory specimens during the acute phase of infection. The lowest  concentration of SARS-CoV-2 viral copies this assay can detect is 250  copies / mL. A negative result does not preclude SARS-CoV-2 infection  and should not be used as the sole basis for treatment or other  patient management decisions.  A negative result may occur with  improper specimen collection / handling, submission of specimen other  than nasopharyngeal swab, presence of viral mutation(s) within the  areas targeted by this assay, and inadequate number of viral copies  (<250 copies / mL). A negative result must be combined with clinical  observations, patient history, and epidemiological information. If result is POSITIVE SARS-CoV-2 target nucleic acids are DETECTED. The SARS-CoV-2 RNA is generally detectable in upper and lower  respiratory specimens dur ing the acute phase of infection.  Positive  results are indicative of active infection with SARS-CoV-2.  Clinical  correlation with patient history and  other diagnostic information is  necessary to determine patient infection status.  Positive results do  not rule out bacterial infection or co-infection with other viruses. If result is PRESUMPTIVE POSTIVE SARS-CoV-2 nucleic acids MAY BE PRESENT.   A presumptive positive result was obtained on the submitted specimen  and confirmed on repeat testing.  While 2019 novel coronavirus  (SARS-CoV-2) nucleic acids may be present in the submitted sample  additional confirmatory testing may be necessary for epidemiological  and / or clinical management purposes  to differentiate between  SARS-CoV-2 and other Sarbecovirus currently known to infect humans.  If clinically indicated additional testing with an alternate test  methodology [phone]) is advised. The SARS-CoV-2 RNA is generally  detectable in upper and lower respiratory sp ecimens during  the acute  phase of infection. The expected result is Negative. Fact Sheet for Patients:  StrictlyIdeas.no Fact Sheet for Healthcare Providers: BankingDealers.co.za This test is not yet approved or cleared by the [place] FDA and has been authorized for detection and/or diagnosis of SARS-CoV-2 by FDA under an Emergency Use Authorization (EUA).  This EUA will remain in effect (meaning this test can be used) for the duration of the COVID-19 declaration under Section 564(b)(1) of the Act, 21 U.S.C. section 360bbb-3(b)(1), unless the authorization is terminated or revoked sooner. Performed at [facility], [address] [place], [place] [address]   APTT     Status: None   Collection Time: 01/05/19  7:40 AM  Result Value Ref Range   aPTT 36 24 - 36 seconds    Comment: Performed at [facility], [address] [place], [place] [address]  Protime-INR     Status: None   Collection Time: 01/05/19  7:40 AM  Result Value Ref Range   Prothrombin Time 13.3 11.4 - 15.2 seconds   INR 1.0  0.8 - 1.2    Comment: (NOTE) INR goal varies based on device and disease states. Performed at [facility], [place], [place] [address]   Troponin I (High Sensitivity)     Status: Abnormal   Collection Time: 01/05/19 10:03 AM  Result Value Ref Range   Troponin I (High Sensitivity) 40 (H) <18 ng/L    Comment: (NOTE) Elevated high sensitivity troponin I (hsTnI) values and significant  changes across serial measurements may suggest ACS but many other  chronic and acute conditions are known to elevate hsTnI results.  Refer to the "Links" section for chest pain algorithms and additional  guidance. Performed at [facility], [place], [place] [address]   Troponin I (High Sensitivity)     Status: Abnormal   Collection Time: 01/05/19 12:33 PM  Result Value Ref Range   Troponin I (High Sensitivity) 37 (H) <18 ng/L    Comment: (NOTE) Elevated high sensitivity troponin I (hsTnI) values and significant  changes across serial measurements may suggest ACS but many other  chronic and acute conditions are known to elevate hsTnI results.  Refer to the "Links" section for chest pain algorithms and additional  guidance. Performed at [facility], [place], [place] [address]   Heparin level (unfractionated)     Status: None   Collection Time: 01/05/19  4:46 PM  Result Value Ref Range   Heparin Unfractionated 0.40 0.30 - 0.70 IU/mL    Comment: (NOTE) If heparin results are below expected values, and patient dosage has  been confirmed, suggest follow up testing of antithrombin III levels. Performed at [facility], [address] [place], [place] [address]   Comprehensive metabolic panel     Status: Abnormal   Collection Time: 01/06/19  1:10 AM  Result Value Ref Range   Sodium 141 135 - 145 mmol/L   Potassium 3.8 3.5 - 5.1 mmol/L   Chloride 108 98 - 111 mmol/L   CO2 27 22 - 32 mmol/L   Glucose, Bld 123  (H) 70 - 99 mg/dL   BUN 37 (H) 8 - 23 mg/dL   Creatinine, Ser 1.04 (H) 0.44 - 1.00 mg/dL   Calcium 8.5 (L) 8.9 - 10.3 mg/dL   Total Protein 6.6 6.5 - 8.1 g/dL   Albumin 4.0 3.5 - 5.0 g/dL   AST 24 15 - 41 U/L  ALT 22 0 - 44 U/L   Alkaline Phosphatase 40 38 - 126 U/L   Total Bilirubin 0.6 0.3 - 1.2 mg/dL   GFR calc non Af Amer 49 (L) >60 mL/min   GFR calc Af Amer 57 (L) >60 mL/min   Anion gap 6 5 - 15    Comment: Performed at [facility], [address] [place], [place] [address]  CBC with Differential/Platelet     Status: Abnormal   Collection Time: 01/06/19  1:10 AM  Result Value Ref Range   WBC 4.6 4.0 - 10.5 K/uL   RBC 3.91 3.87 - 5.11 MIL/uL   Hemoglobin 12.2 12.0 - 15.0 g/dL   HCT 35.6 (L) 36.0 - 46.0 %   MCV 91.0 80.0 - 100.0 fL   MCH 31.2 26.0 - 34.0 pg   MCHC 34.3 30.0 - 36.0 g/dL   RDW 12.6 11.5 - 15.5 %   Platelets 115 (L) 150 - 400 K/uL    Comment: Immature Platelet Fraction may be clinically indicated, consider ordering this additional test [id number]    nRBC 0.0 0.0 - 0.2 %   Neutrophils Relative % 61 %   Neutro Abs 2.8 1.7 - 7.7 K/uL   Lymphocytes Relative 27 %   Lymphs Abs 1.2 0.7 - 4.0 K/uL   Monocytes Relative 8 %   Monocytes Absolute 0.4 0.1 - 1.0 K/uL   Eosinophils Relative 4 %   Eosinophils Absolute 0.2 0.0 - 0.5 K/uL   Basophils Relative 0 %   Basophils Absolute 0.0 0.0 - 0.1 K/uL   Immature Granulocytes 0 %   Abs Immature Granulocytes 0.01 0.00 - 0.07 K/uL    Comment: Performed at [facility], [address] [place], [place] [address]  Heparin level (unfractionated)     Status: Abnormal   Collection Time: 01/06/19  1:10 AM  Result Value Ref Range   Heparin Unfractionated 0.20 (L) 0.30 - 0.70 IU/mL    Comment: (NOTE) If heparin results are below expected values, and patient dosage has  been confirmed, suggest follow up testing of antithrombin III levels. Performed at [facility], [place], [place] [address]   Heparin level (unfractionated)     Status: None   Collection Time: 01/06/19  8:29 AM  Result Value Ref Range   Heparin Unfractionated 0.50 0.30 - 0.70 IU/mL    Comment: (NOTE) If heparin results are below expected values, and patient dosage has  been confirmed, suggest follow up testing of antithrombin III levels. Performed at [facility], [place], [place] [address]   Heparin level (unfractionated)     Status: None   Collection Time: 01/06/19  3:05 PM  Result Value Ref Range   Heparin Unfractionated 0.57 0.30 - 0.70 IU/mL    Comment: (NOTE) If heparin results are below expected values, and patient dosage has  been confirmed, suggest follow up testing of antithrombin III levels. Performed at [facility], [place], [place] [address]     Ct Head Wo Contrast  Result Date: 01/05/2019 CLINICAL DATA:  Dizziness, headache, chest pain EXAM: CT HEAD WITHOUT CONTRAST TECHNIQUE: Contiguous axial images were obtained from the base of the skull through the vertex without intravenous contrast. COMPARISON:  01/25/2011 FINDINGS: Brain: There is atrophy and chronic small vessel disease changes. No acute intracranial abnormality. Specifically, no hemorrhage, hydrocephalus, mass lesion, acute infarction, or significant intracranial injury. Vascular: No hyperdense vessel or unexpected calcification. Skull: No acute calvarial  abnormality. Sinuses/Orbits: Visualized paranasal sinuses and mastoids clear. Orbital soft tissues unremarkable. Other: None IMPRESSION: Atrophy, chronic microvascular disease. No acute intracranial abnormality. Electronically Signed   By: Rolm Baptise M.D.   On: 01/05/2019 08:13   Dg Chest Port 1 View  Result Date: 01/05/2019 CLINICAL DATA:  Onset dizziness and weakness last night. EXAM: PORTABLE CHEST 1 VIEW COMPARISON:  CT chest 08/19/2018. Single-view of the chest 10/10/2017. FINDINGS: Chronic coarsening of  the pulmonary interstitium is again seen. There are some Kerley B lines in the right lower lung zone suggestive of mild interstitial edema. Heart size is upper normal. No pleural effusion. Aortic atherosclerosis is noted. No acute bony abnormality. The patient is status post vertebral augmentation in the midthoracic spine. Sclerotic lesion in the proximal left humerus is likely an enchondroma and unchanged. IMPRESSION: Findings suggestive of mild interstitial pulmonary edema. Chronic coarsening of the pulmonary interstitium and pulmonary scar. Atherosclerosis. Electronically Signed   By: Inge Rise M.D.   On: 01/05/2019 07:21    Review of Systems  Constitutional: Positive for malaise/fatigue.  HENT: Positive for congestion.   Eyes: Positive for blurred vision.  Respiratory: Positive for shortness of breath.   Cardiovascular: Positive for chest pain and palpitations.  Gastrointestinal: Positive for heartburn.  Genitourinary: Negative.   Musculoskeletal: Negative.   Skin: Negative.   Neurological: Positive for dizziness and weakness.  Endo/Heme/Allergies: Negative.   Psychiatric/Behavioral: Negative.    Blood pressure (!) 168/62, pulse 65, temperature 97.8 F (36.6 C), temperature source Oral, resp. rate 19, height 4\' 9"  (1.448 m), weight 62.1 kg, SpO2 98 %. Physical Exam  Nursing note and vitals reviewed. Constitutional: She is oriented to person, place, and time. She appears well-developed and well-nourished.  HENT:  Head: Normocephalic and atraumatic.  Eyes: Pupils are equal, round, and reactive to light. Conjunctivae and EOM are normal.  Neck: Normal range of motion. Neck supple.  Cardiovascular: Normal rate, regular rhythm and normal heart sounds.  Respiratory: Effort normal and breath sounds normal.  GI: Soft. Bowel sounds are normal.  Musculoskeletal: Normal range of motion.  Neurological: She is alert and oriented to person, place, and time. She has normal reflexes.  Skin:  Skin is warm and dry.  Psychiatric: She has a normal mood and affect.    Assessment/Plan: Atypical chest pain Vertigo History of congestive heart failure Diabetes GERD Hypertension Hyperlipidemia Chronic renal sufficiency stage III Dilated cardiomyopathy . Plan Agree with admit rule out for myocardial infarction Borderline troponins were relatively insignificant Chest pain appears to sound atypical so recommend conservative therapy Continue Entresto for heart failure cardiomyopathy Hypertension control with Coreg and Entresto Continue thyroid management with levothyroxine Follow-up with nephrology for renal sufficiency Continue Lipitor therapy for lipid management Discontinue IV heparin tomorrow consider discharge follow-up with cardiology as an outpatient Do not recommend any aggressive cardiac studies at this point  Baila Rouse D Jayion Schneck 01/06/2019, 3:45 PM

## 2019-01-06 NOTE — Progress Notes (Signed)
No pain at this time. But states she has had stomach pain which she thinks might be gas.  Last BM was yesterday.

## 2019-01-06 NOTE — Consult Note (Signed)
ANTICOAGULATION CONSULT NOTE - Initial Consult  Pharmacy Consult for heparin infusion  Indication: chest pain/ACS  Allergies  Allergen Reactions  . Ciprofloxacin   . Digoxin And Related   . Erythromycin   . Lopid [Gemfibrozil]   . Nitrofurantoin   . Spironolactone Other (See Comments)    Hospitalized for dehydration  . Cefuroxime Rash  . Penicillins Rash    Has patient had a PCN reaction causing immediate rash, facial/tongue/throat swelling, SOB or lightheadedness with hypotension: Unknown Has patient had a PCN reaction causing severe rash involving mucus membranes or skin necrosis: No Has patient had a PCN reaction that required hospitalization: No Has patient had a PCN reaction occurring within the last 10 years: No If all of the above answers are "NO", then may proceed with Cephalosporin use.    Patient Measurements: Height: 4\' 9"  (144.8 cm) Weight: 137 lb (62.1 kg) IBW/kg (Calculated) : 38.6 Heparin Dosing Weight: 52 kg  Vital Signs: Temp: 97.9 F (36.6 C) (07/22 1943) Temp Source: Oral (07/22 1943) BP: 157/69 (07/22 1943) Pulse Rate: 65 (07/22 1943)  Labs: Recent Labs    01/05/19 0553 01/05/19 0740 01/05/19 1003 01/05/19 1233 01/05/19 1646 01/06/19 0110  HGB 13.4  --   --   --   --  12.2  HCT 40.2  --   --   --   --  35.6*  PLT 126*  --   --   --   --  115*  APTT  --  36  --   --   --   --   LABPROT  --  13.3  --   --   --   --   INR  --  1.0  --   --   --   --   HEPARINUNFRC  --   --   --   --  0.40 0.20*  CREATININE 1.41*  --   --   --   --  1.04*  TROPONINIHS 40*  --  40* 37*  --   --     Estimated Creatinine Clearance: 30.5 mL/min (A) (by C-G formula based on SCr of 1.04 mg/dL (H)).   Medical History: Past Medical History:  Diagnosis Date  . Anemia   . CHF (congestive heart failure) ([place])   . Diabetes mellitus without complication ([place]) 48/18/5631   Currently no high BS.  Has come off meds.  But having episodes of low BS now.  . Dilated  idiopathic cardiomyopathy ([place])   . GERD (gastroesophageal reflux disease)   . Hyperlipidemia   . Hypertension   . Hypothyroidism   . Osteoporosis   . Renal disorder    stage 3    Assessment: Pharmacy consulted for heparin dosing and monitoring for 85 yo female admitted with ACS/NSTEMI. No anti-coagulants reported PTA.   7/22 HL@ 1646: 0.40. Therapeutic x1 dose. Patient is heparin infusion rate of 600 units.  7/22 @ 0110 HL: 0.20  Goal of Therapy:  Heparin level 0.3-0.7 units/ml Monitor platelets by anticoagulation protocol: Yes   Plan:  7/22 @ 0110 HL: 0.20. Level is subtherapeutic.  Will order Heparin 750 unit bolus and increase heparin infusion to 700 units/hr Recheck anti-Xa level in 6 hours after infusion rate change.   Continue to monitor H&H and platelets  Pernell Dupre, PharmD, BCPS Clinical Pharmacist 01/06/2019 1:43 AM

## 2019-01-06 NOTE — Progress Notes (Signed)
I was going to discharge patient, she was ready to go and when we sit her up she complaints of dizziness and was lightheaded. Patient states she does not feel safe going home. Notify Dr. Brett Albino about the situation, per MD will keep patient over night and do orthostatic VS on her. Patient's son Remo Lipps was also notify about this situation. Patient's discharge order is cancelled. RN will continue to monitor.

## 2019-01-06 NOTE — Consult Note (Signed)
ANTICOAGULATION CONSULT NOTE - Initial Consult  Pharmacy Consult for heparin infusion  Indication: chest pain/ACS  Allergies  Allergen Reactions  . Ciprofloxacin   . Digoxin And Related   . Erythromycin   . Lopid [Gemfibrozil]   . Nitrofurantoin   . Spironolactone Other (See Comments)    Hospitalized for dehydration  . Cefuroxime Rash  . Penicillins Rash    Has patient had a PCN reaction causing immediate rash, facial/tongue/throat swelling, SOB or lightheadedness with hypotension: Unknown Has patient had a PCN reaction causing severe rash involving mucus membranes or skin necrosis: No Has patient had a PCN reaction that required hospitalization: No Has patient had a PCN reaction occurring within the last 10 years: No If all of the above answers are "NO", then may proceed with Cephalosporin use.    Patient Measurements: Height: 4\' 9"  (144.8 cm) Weight: 137 lb (62.1 kg) IBW/kg (Calculated) : 38.6 Heparin Dosing Weight: 52 kg  Vital Signs: Temp: 97.8 F (36.6 C) (07/23 0756) Temp Source: Oral (07/23 0756) BP: 168/62 (07/23 0756) Pulse Rate: 65 (07/23 0756)  Labs: Recent Labs    01/05/19 0553 01/05/19 0740 01/05/19 1003 01/05/19 1233 01/05/19 1646 01/06/19 0110 01/06/19 0829  HGB 13.4  --   --   --   --  12.2  --   HCT 40.2  --   --   --   --  35.6*  --   PLT 126*  --   --   --   --  115*  --   APTT  --  36  --   --   --   --   --   LABPROT  --  13.3  --   --   --   --   --   INR  --  1.0  --   --   --   --   --   HEPARINUNFRC  --   --   --   --  0.40 0.20* 0.50  CREATININE 1.41*  --   --   --   --  1.04*  --   TROPONINIHS 40*  --  40* 37*  --   --   --     Estimated Creatinine Clearance: 30.5 mL/min (A) (by C-G formula based on SCr of 1.04 mg/dL (H)).   Medical History: Past Medical History:  Diagnosis Date  . Anemia   . CHF (congestive heart failure) ([place])   . Diabetes mellitus without complication ([place]) 09/60/4540   Currently no high BS.  Has come off  meds.  But having episodes of low BS now.  . Dilated idiopathic cardiomyopathy ([place])   . GERD (gastroesophageal reflux disease)   . Hyperlipidemia   . Hypertension   . Hypothyroidism   . Osteoporosis   . Renal disorder    stage 3    Assessment: Pharmacy consulted for heparin dosing and monitoring for 85 yo female admitted with ACS/NSTEMI. No anti-coagulants reported PTA.   7/22 HL@ 1646: 0.40. Therapeutic x1 dose. Patient is heparin infusion rate of 600 units.  7/22 @ 0110 HL: 0.20 750 unit bolus and increase heparin infusion to 700 units/hr 7/23 @ 0829 HL 0.5    Goal of Therapy:  Heparin level 0.3-0.7 units/ml Monitor platelets by anticoagulation protocol: Yes   Plan:  Heparin level is therapeutic x 1.  Continue heparin infusion at 700 units/hr.  Recheck anti-Xa level in 6 hours.   Continue to monitor H&H and platelets  Oswald Hillock,  PharmD, BCPS Clinical Pharmacist 01/06/2019 9:41 AM

## 2019-01-07 DIAGNOSIS — R079 Chest pain, unspecified: Secondary | ICD-10-CM | POA: Diagnosis present

## 2019-01-07 DIAGNOSIS — R42 Dizziness and giddiness: Secondary | ICD-10-CM | POA: Diagnosis present

## 2019-01-07 DIAGNOSIS — Z79890 Hormone replacement therapy (postmenopausal): Secondary | ICD-10-CM | POA: Diagnosis not present

## 2019-01-07 DIAGNOSIS — Z66 Do not resuscitate: Secondary | ICD-10-CM | POA: Diagnosis present

## 2019-01-07 DIAGNOSIS — Z7982 Long term (current) use of aspirin: Secondary | ICD-10-CM | POA: Diagnosis not present

## 2019-01-07 DIAGNOSIS — I130 Hypertensive heart and chronic kidney disease with heart failure and stage 1 through stage 4 chronic kidney disease, or unspecified chronic kidney disease: Secondary | ICD-10-CM | POA: Diagnosis present

## 2019-01-07 DIAGNOSIS — K219 Gastro-esophageal reflux disease without esophagitis: Secondary | ICD-10-CM | POA: Diagnosis present

## 2019-01-07 DIAGNOSIS — Z20828 Contact with and (suspected) exposure to other viral communicable diseases: Secondary | ICD-10-CM | POA: Diagnosis present

## 2019-01-07 DIAGNOSIS — E039 Hypothyroidism, unspecified: Secondary | ICD-10-CM | POA: Diagnosis present

## 2019-01-07 DIAGNOSIS — I951 Orthostatic hypotension: Secondary | ICD-10-CM | POA: Diagnosis present

## 2019-01-07 DIAGNOSIS — Z880 Allergy status to penicillin: Secondary | ICD-10-CM | POA: Diagnosis not present

## 2019-01-07 DIAGNOSIS — Z7983 Long term (current) use of bisphosphonates: Secondary | ICD-10-CM | POA: Diagnosis not present

## 2019-01-07 DIAGNOSIS — Z87891 Personal history of nicotine dependence: Secondary | ICD-10-CM | POA: Diagnosis not present

## 2019-01-07 DIAGNOSIS — M810 Age-related osteoporosis without current pathological fracture: Secondary | ICD-10-CM | POA: Diagnosis present

## 2019-01-07 DIAGNOSIS — E1122 Type 2 diabetes mellitus with diabetic chronic kidney disease: Secondary | ICD-10-CM | POA: Diagnosis present

## 2019-01-07 DIAGNOSIS — Z881 Allergy status to other antibiotic agents status: Secondary | ICD-10-CM | POA: Diagnosis not present

## 2019-01-07 DIAGNOSIS — E785 Hyperlipidemia, unspecified: Secondary | ICD-10-CM | POA: Diagnosis present

## 2019-01-07 DIAGNOSIS — I5042 Chronic combined systolic (congestive) and diastolic (congestive) heart failure: Secondary | ICD-10-CM | POA: Diagnosis present

## 2019-01-07 DIAGNOSIS — Z888 Allergy status to other drugs, medicaments and biological substances status: Secondary | ICD-10-CM | POA: Diagnosis not present

## 2019-01-07 DIAGNOSIS — Z8249 Family history of ischemic heart disease and other diseases of the circulatory system: Secondary | ICD-10-CM | POA: Diagnosis not present

## 2019-01-07 DIAGNOSIS — N183 Chronic kidney disease, stage 3 (moderate): Secondary | ICD-10-CM | POA: Diagnosis present

## 2019-01-07 DIAGNOSIS — I420 Dilated cardiomyopathy: Secondary | ICD-10-CM | POA: Diagnosis present

## 2019-01-07 DIAGNOSIS — N179 Acute kidney failure, unspecified: Secondary | ICD-10-CM | POA: Diagnosis present

## 2019-01-07 LAB — CBC
HCT: 36.9 % (ref 36.0–46.0)
Hemoglobin: 12.4 g/dL (ref 12.0–15.0)
MCH: 30.8 pg (ref 26.0–34.0)
MCHC: 33.6 g/dL (ref 30.0–36.0)
MCV: 91.8 fL (ref 80.0–100.0)
Platelets: 111 10*3/uL — ABNORMAL LOW (ref 150–400)
RBC: 4.02 MIL/uL (ref 3.87–5.11)
RDW: 12.4 % (ref 11.5–15.5)
WBC: 4.5 10*3/uL (ref 4.0–10.5)
nRBC: 0 % (ref 0.0–0.2)

## 2019-01-07 LAB — HEPARIN LEVEL (UNFRACTIONATED): Heparin Unfractionated: <0.1 IU/mL — ABNORMAL LOW (ref 0.30–0.70)

## 2019-01-07 MED ORDER — HYDRALAZINE HCL 20 MG/ML IJ SOLN
2.0000 mg | Freq: Four times a day (QID) | INTRAMUSCULAR | Status: DC | PRN
  Administered 2019-01-07: 2 mg via INTRAVENOUS
  Filled 2019-01-07: qty 1

## 2019-01-07 NOTE — Progress Notes (Signed)
Up to chair for breakfast. No c/o dizziness.

## 2019-01-07 NOTE — Evaluation (Signed)
Physical Therapy Evaluation Patient Details Name: Jacqueline [name] MRN: [id number] DOB: [birth year] Today's Date: 01/07/2019   History of Present Illness  Jacqueline [name] is an 83yoF who comes to [facility] on 7/22 with dizzness and CP, workup thus far no consistent with typical ACS etiology per cardiology. At baseline pt lives wth Son, requires help with basic ADL sometimes, and uses 2L/min at all times.  Clinical Impression  Pt admitted with above diagnosis. Pt currently with functional limitations due to the deficits listed below (see "PT Problem List"). Upon entry, pt in bed, awake and hesitant to participate initially as she is concerned it will make her more weak and/or symptomatic and ultimately delay her DC to home. Pt wants to leave the hospital even if her medical issue remains unresolved. The pt is alert and oriented x3, pleasant, conversational, and generally a good historian. Pt requires modA for bed mobility, MaxA for STS transfers with RW, and ultimately is too weak to establish supervised and unassisted standing, knees intermittently buckling. Pt is not safe to attempt AMB at this time. Orthostatic vitals are essentially flat, pt reports mild "dizziness" throughout without progression during mobility.  Functional mobility assessment demonstrates increased effort/time requirements, poor tolerance, and absolute need for physical assistance, whereas the patient performed these at a higher level of independence PTA. Pt has limited support at home at this time, reports that her son is not able to help her very much. Pt will benefit from skilled PT intervention to increase independence and safety with basic mobility in preparation for discharge to the venue listed below.       Follow Up Recommendations SNF;Supervision/Assistance - 24 hour;Supervision for mobility/OOB    Equipment Recommendations  None recommended by PT    Recommendations for Other Services       Precautions / Restrictions  Precautions Precautions: Fall Restrictions Weight Bearing Restrictions: No      Mobility  Bed Mobility Overal bed mobility: Needs Assistance Bed Mobility: Supine to Sit;Sit to Supine     Supine to sit: Supervision Sit to supine: Mod assist   General bed mobility comments: heavy effort required to EOB; assist with legs for return to supine  Transfers Overall transfer level: Needs assistance Equipment used: Rolling walker (2 wheeled) Transfers: Sit to/from Stand Sit to Stand: Max assist         General transfer comment: 3x from EOB, progressively weakness, never strong enough to establish unassisted standing; legs appears weak and buckly  Ambulation/Gait Ambulation/Gait assistance: (no safe to attempt at this time.)              Stairs            Wheelchair Mobility    Modified Rankin (Stroke Patients Only)       Balance Overall balance assessment: History of Falls;Needs assistance Sitting-balance support: Feet supported;No upper extremity supported Sitting balance-Leahy Scale: Good     Standing balance support: Bilateral upper extremity supported;During functional activity Standing balance-Leahy Scale: Zero                               Pertinent Vitals/Pain Pain Assessment: No/denies pain    Home Living Family/patient expects to be discharged to:: Private residence Living Arrangements: Children Available Help at Discharge: Family(Son; intermittent help from DTR) Type of Home: (condo) Home Access: Ramped entrance     Home Layout: One level Home Equipment: [place] - 4 wheels;Shower seat;Toilet riser;Wheelchair - manual  Prior Function Level of Independence: Needs assistance   Gait / Transfers Assistance Needed: household AMB c  4WW  ADL's / Homemaking Assistance Needed: DTR and/or Son assist with dressign bathing as needed, pt reports is very difficult at times.  Comments: Well conddumented falls history over the past  couple years;     Hand Dominance        Extremity/Trunk Assessment   Upper Extremity Assessment Upper Extremity Assessment: Generalized weakness    Lower Extremity Assessment Lower Extremity Assessment: Generalized weakness       Communication   Communication: HOH  Cognition Arousal/Alertness: Awake/alert Behavior During Therapy: WFL for tasks assessed/performed Overall Cognitive Status: Within Functional Limits for tasks assessed                                 General Comments: minimally motivated due to anxiety over provoign symptoms and weakness.      General Comments      Exercises     Assessment/Plan    PT Assessment Patient needs continued PT services  PT Problem List Decreased strength;Decreased range of motion;Decreased activity tolerance;Decreased balance;Decreased mobility;Decreased knowledge of precautions       PT Treatment Interventions DME instruction;Balance training;Gait training;Stair training;Functional mobility training;Therapeutic activities;Therapeutic exercise;Patient/family education    PT Goals (Current goals can be found in the Care Plan section)  Acute Rehab PT Goals Patient Stated Goal: Pt wants to go home at all costs PT Goal Formulation: With patient Time For Goal Achievement: 01/21/19 Potential to Achieve Goals: Good    Frequency Min 2X/week   Barriers to discharge Inaccessible home environment      Co-evaluation               AM-PAC PT "6 Clicks" Mobility  Outcome Measure Help needed turning from your back to your side while in a flat bed without using bedrails?: A Little Help needed moving from lying on your back to sitting on the side of a flat bed without using bedrails?: A Lot Help needed moving to and from a bed to a chair (including a wheelchair)?: A Lot Help needed standing up from a chair using your arms (e.g., wheelchair or bedside chair)?: A Lot Help needed to walk in hospital room?:  Total Help needed climbing 3-5 steps with a railing? : Total 6 Click Score: 11    End of Session Equipment Utilized During Treatment: Gait belt;Oxygen Activity Tolerance: Patient limited by fatigue Patient left: in bed;with call bell/phone within reach;with bed alarm set Nurse Communication: Mobility status PT Visit Diagnosis: Other abnormalities of gait and mobility (R26.89);Difficulty in walking, not elsewhere classified (R26.2);Muscle weakness (generalized) (M62.81)    Time: 5993-5701 PT Time Calculation (min) (ACUTE ONLY): 24 min   Charges:   PT Evaluation $PT Eval Low Complexity: 1 Low          2:25 PM, 01/07/19 Etta Grandchild, PT, DPT Physical Therapist - [facility]  [phone] ([place])   Buccola,Allan C 01/07/2019, 2:21 PM

## 2019-01-07 NOTE — Progress Notes (Signed)
[place] at [place] NAME: Jacqueline [name]    MR#:  [id number]  DATE OF BIRTH:  1932/09/29  SUBJECTIVE:  Patient states she is feeling better this morning.  We tried to discharge her yesterday, but she felt lightheaded with standing.  Orthostatics were positive yesterday.  She was placed on gentle IV fluids overnight.  She stood up again this morning, and did not have any dizziness or lightheadedness.  She does feel overall weak.   REVIEW OF SYSTEMS:  Review of Systems  Constitutional: Positive for malaise/fatigue. Negative for chills and fever.  HENT: Negative for congestion and sore throat.   Eyes: Negative for blurred vision and double vision.  Respiratory: Negative for cough and shortness of breath.   Cardiovascular: Negative for chest pain and palpitations.  Gastrointestinal: Negative for nausea and vomiting.  Genitourinary: Negative for dysuria and urgency.  Musculoskeletal: Negative for back pain and neck pain.  Neurological: Positive for weakness. Negative for dizziness, focal weakness and headaches.  Psychiatric/Behavioral: Negative for depression. The patient is not nervous/anxious.     DRUG ALLERGIES:   Allergies  Allergen Reactions  . Ciprofloxacin   . Digoxin And Related   . Erythromycin   . Lopid [Gemfibrozil]   . Nitrofurantoin   . Spironolactone Other (See Comments)    Hospitalized for dehydration  . Cefuroxime Rash  . Penicillins Rash    Has patient had a PCN reaction causing immediate rash, facial/tongue/throat swelling, SOB or lightheadedness with hypotension: Unknown Has patient had a PCN reaction causing severe rash involving mucus membranes or skin necrosis: No Has patient had a PCN reaction that required hospitalization: No Has patient had a PCN reaction occurring within the last 10 years: No If all of the above answers are "NO", then may proceed with Cephalosporin use.    VITALS:  Blood pressure (!) 165/70,  pulse 66, temperature 98.6 F (37 C), temperature source Oral, resp. rate (!) 24, height 4\' 9"  (1.448 m), weight 63.2 kg, SpO2 95 %. PHYSICAL EXAMINATION:  Physical Exam  GENERAL:  Laying in the bed with no acute distress.  HEENT: Head atraumatic, normocephalic. Pupils equal, round, reactive to light and accommodation. No scleral icterus. Extraocular muscles intact. Oropharynx and nasopharynx clear.  NECK:  Supple, no jugular venous distention. No thyroid enlargement. LUNGS: Lungs are clear to auscultation bilaterally. No wheezes, crackles, rhonchi. No use of accessory muscles of respiration.  CARDIOVASCULAR: RRR, S1, S2 normal. No murmurs, rubs, or gallops.  ABDOMEN: Soft, nontender, nondistended. Bowel sounds present.  EXTREMITIES: No pedal edema, cyanosis, or clubbing.  NEUROLOGIC: CN 2-12 intact, no focal deficits. +global weakness. Sensation intact throughout. Gait not checked.  PSYCHIATRIC: The patient is alert and oriented x 3.  SKIN: No obvious rash, lesion, or ulcer.  LABORATORY PANEL:  Female CBC Recent Labs  Lab 01/07/19 0508  WBC 4.5  HGB 12.4  HCT 36.9  PLT 111*   ------------------------------------------------------------------------------------------------------------------ Chemistries  Recent Labs  Lab 01/06/19 0110  NA 141  K 3.8  CL 108  CO2 27  GLUCOSE 123*  BUN 37*  CREATININE 1.04*  CALCIUM 8.5*  AST 24  ALT 22  ALKPHOS 40  BILITOT 0.6   RADIOLOGY:  No results found. ASSESSMENT AND PLAN:   Orthostatic hypotension/weakness- orthostatic vitals were positive yesterday afternoon.  Able to stand without any lightheadedness this morning. -Will stop IV fluids today and encourage po intake -Recheck orthostatics -Seen by PT, who recommended SNF  Chest pain- resolved. -Seen  by cardiology, who recommended outpatient follow-up. -Troponin 40 > 40 > 37 -Continue aspirin, Coreg, statin, Imdur  Chronic systolic congestive heart failure-stable, no signs of  volume overload. -Continue Entresto -Holding torsemide due to orthostatic hypotension  Hypertension- BPs have been well-controlled -Continue Coreg, Entresto, isosorbide  Hypothyroidism -Check TSH -Continue synthroid  Acute renal failure on chronic kidney disease stage II- creatinine has improved -Recheck bmp in the morning -Avoid nephrotoxic agents  All the records are reviewed and case discussed with Care Management/Social Worker. Management plans discussed with the patient, family and they are in agreement.  CODE STATUS: Full Code  TOTAL TIME TAKING CARE OF THIS PATIENT: 40 minutes.   More than 50% of the time was spent in counseling/coordination of care: YES  POSSIBLE D/C IN 1-2 DAYS, DEPENDING ON CLINICAL CONDITION.   Berna Spare [name] M.D on 01/07/2019 at 3:40 PM  Between 7am to 6pm - Pager - [phone]  After 6pm go to [email or web address] - Proofreader  [facility]  Office  [phone]  CC: Primary care physician; Tracie Harrier, MD  Note: This dictation was prepared with Dragon dictation along with smaller phrase technology. Any transcriptional errors that result from this process are unintentional.

## 2019-01-07 NOTE — Progress Notes (Addendum)
Got a phone call from her Daughter, [name].  She thinks her Mother is doing things to prevent her discharge.  [name] is at [employer] and thinks her Mother doesn't want to go to her brother's house.  Says she is very dependent on [name].  [name] will be home tomorrow.  Prior to admission patient was doing her own cooking and taking care of herself independently.  [name] say her Mother dislike physical therapy and acts helpless when they try to work with her - "turns into a wet noodle"..  She thinks if we mention a SNF we will find that her Mother is doing well and wants to be discharged and will demonstrate her ability to ambulate and take care of herself - especially since [name] is home. I have found the patient to be quite "dramatic" about her complaints and symptoms.

## 2019-01-07 NOTE — Progress Notes (Signed)
Son cannot come to pick her up until 1700.

## 2019-01-08 LAB — CBC
HCT: 37.6 % (ref 36.0–46.0)
Hemoglobin: 12.7 g/dL (ref 12.0–15.0)
MCH: 31.4 pg (ref 26.0–34.0)
MCHC: 33.8 g/dL (ref 30.0–36.0)
MCV: 92.8 fL (ref 80.0–100.0)
Platelets: 119 10*3/uL — ABNORMAL LOW (ref 150–400)
RBC: 4.05 MIL/uL (ref 3.87–5.11)
RDW: 12.7 % (ref 11.5–15.5)
WBC: 5.3 10*3/uL (ref 4.0–10.5)
nRBC: 0 % (ref 0.0–0.2)

## 2019-01-08 LAB — BASIC METABOLIC PANEL
Anion gap: 6 (ref 5–15)
BUN: 25 mg/dL — ABNORMAL HIGH (ref 8–23)
CO2: 25 mmol/L (ref 22–32)
Calcium: 8.8 mg/dL — ABNORMAL LOW (ref 8.9–10.3)
Chloride: 108 mmol/L (ref 98–111)
Creatinine, Ser: 0.9 mg/dL (ref 0.44–1.00)
GFR calc Af Amer: >60 mL/min (ref >60)
GFR calc non Af Amer: 59 mL/min — ABNORMAL LOW (ref >60)
Glucose, Bld: 116 mg/dL — ABNORMAL HIGH (ref 70–99)
Potassium: 4.1 mmol/L (ref 3.5–5.1)
Sodium: 139 mmol/L (ref 135–145)

## 2019-01-08 LAB — TSH: TSH: 5.494 u[IU]/mL — ABNORMAL HIGH (ref 0.350–4.500)

## 2019-01-08 MED ORDER — TORSEMIDE 10 MG PO TABS: 10.0000 mg | ORAL_TABLET | Freq: Every day | ORAL | 0 refills | Status: DC

## 2019-01-08 NOTE — Progress Notes (Addendum)
Stable for discharge home today.  I spoke with patient daughter, according to her , mother was admitted to the hospital, today he wants to go home, according to the patient's mother is able to walk with a walker around the house.  And patient does not think she needs any physical therapy, daughter [name] also mentioned that physical therapy evaluation that recommended rehab because she did not participate in physical therapy.. Stable for discharge home, discharge instructions on the computer, decrease the dose of torsemide to 10 mg daily due to orthostatic changes.  Continue oxygen 2 L all the time.  According to patient's daughter patient has caregiver 2 times a week.

## 2019-01-08 NOTE — Progress Notes (Signed)
Patient discharged to home with son. Patient was orthostatic negative with no complaints upon discharge. IV and telemetry discontinued and removed. Patient and son were educated on all discharge paperwork, including: medications, heart failure education, prescription, and follow up appointments. Patient left on portable oxygen tank from home.

## 2019-01-08 NOTE — TOC Initial Note (Signed)
Transition of Care [facility]) - Initial/Assessment Note    Patient Details  Name: Jacqueline [name] MRN: [id number] Date of Birth: 02-15-1933  Transition of [facility]) CM/SW Contact:    Latanya Maudlin, RN Phone Number: 01/08/2019, 10:49 AM  Clinical Narrative:   TOC spoke with daughter at length. PT is recommending SNF. Patient is not interested. Per daugher every time she goes out of town her mother goes into the hospital so "she can be catered to". Per [name], the patient "puts on a front" in order to receive more care. Per [name] her mother is completely independent with ADL's at baseline and "trys to get me to do everything for her" even though she is capable. Patients son is living with her full time since he works from home. She says that they have tried home health PT several times but her mother always refuses. [name] is on the way back from the beach now and is confident she can manage her mothers care at home.               Expected Discharge Plan: Home/Self Care Barriers to Discharge: No Barriers Identified   Patient Goals and CMS Choice     Choice offered to / list presented to : Patient  Expected Discharge Plan and Services Expected Discharge Plan: Home/Self Care   Discharge Planning Services: CM Consult   Living arrangements for the past 2 months: Apartment Expected Discharge Date: 01/07/19                                    Prior Living Arrangements/Services Living arrangements for the past 2 months: Apartment Lives with:: Adult Children Patient language and need for interpreter reviewed:: Yes Do you feel safe going back to the place where you live?: Yes      Need for Family Participation in Patient Care: Yes (Comment)(pt with behavioral issues)   Current home services: DME Criminal Activity/Legal Involvement Pertinent to Current Situation/Hospitalization: No - Comment as needed  Activities of Daily Living Home Assistive Devices/Equipment: Oxygen, Walker (specify  type), Wheelchair, Eyeglasses, Grab bars in shower, Shower chair with back, Raised toilet seat with rails ADL Screening (condition at time of admission) Patient's cognitive ability adequate to safely complete daily activities?: Yes Is the patient deaf or have difficulty hearing?: Yes Does the patient have difficulty seeing, even when wearing glasses/contacts?: Yes Does the patient have difficulty concentrating, remembering, or making decisions?: No Patient able to express need for assistance with ADLs?: Yes Does the patient have difficulty dressing or bathing?: Yes Independently performs ADLs?: No Communication: Independent Dressing (OT): Needs assistance Is this a change from baseline?: Pre-admission baseline Grooming: Independent Feeding: Independent Bathing: Needs assistance Is this a change from baseline?: Pre-admission baseline Toileting: Independent with device (comment) In/Out Bed: Needs assistance Is this a change from baseline?: Pre-admission baseline Walks in Home: Independent with device (comment)(4 wheeled walker with seat) Does the patient have difficulty walking or climbing stairs?: Yes Weakness of Legs: Both Weakness of Arms/Hands: Both  Permission Sought/Granted Permission sought to share information with : Case Manager                Emotional Assessment Appearance:: Appears older than stated age Attitude/Demeanor/Rapport: Unable to Assess   Orientation: : Oriented to Self, Oriented to Place, Oriented to  Time, Oriented to Situation      Admission diagnosis:  NSTEMI (non-ST elevated myocardial infarction) ([place]) [I21.4]  Patient Active Problem List   Diagnosis Date Noted  . Chest pain 01/05/2019  . Hyponatremia 06/09/2017  . MI, acute, non ST segment elevation ([place]) 06/01/2017  . Acute upper back pain 03/05/2017  . Carcinoma of unknown primary ([place]) 03/05/2017  . Liver lesion 02/27/2017  . Diabetes ([place]) 02/24/2017  . Chronic systolic CHF (congestive  heart failure) ([place]) 02/24/2017  . HTN (hypertension) 02/24/2017  . GERD (gastroesophageal reflux disease) 02/24/2017  . Hypothyroidism 02/24/2017  . Hypoglycemia 02/24/2017  . Dizziness 02/18/2017   PCP:  Tracie Harrier, MD Pharmacy:   [facility], [place] [address] [place] [address] Phone: [phone] Fax: [phone]  [place], [place] - [place] [address] Phone: [phone] Fax: [phone]     Social Determinants of Health (SDOH) Interventions    Readmission Risk Interventions Readmission Risk Prevention Plan 01/08/2019  Transportation Screening Complete  PCP or Specialist Appt within 5-7 Days Complete  Home Care Screening Complete  Medication Review (RN CM) Complete  Some recent data might be hidden

## 2019-02-02 ENCOUNTER — Ambulatory Visit: Payer: Medicare Other | Admitting: Internal Medicine

## 2019-02-02 ENCOUNTER — Other Ambulatory Visit: Payer: Medicare Other

## 2019-02-10 ENCOUNTER — Other Ambulatory Visit: Payer: Self-pay

## 2019-02-11 ENCOUNTER — Inpatient Hospital Stay (HOSPITAL_BASED_OUTPATIENT_CLINIC_OR_DEPARTMENT_OTHER): Payer: Medicare Other | Admitting: Internal Medicine

## 2019-02-11 ENCOUNTER — Inpatient Hospital Stay: Payer: Medicare Other | Attending: Internal Medicine

## 2019-02-11 ENCOUNTER — Other Ambulatory Visit: Payer: Self-pay

## 2019-02-11 DIAGNOSIS — K769 Liver disease, unspecified: Secondary | ICD-10-CM | POA: Diagnosis not present

## 2019-02-11 DIAGNOSIS — Z87891 Personal history of nicotine dependence: Secondary | ICD-10-CM | POA: Diagnosis not present

## 2019-02-11 DIAGNOSIS — R911 Solitary pulmonary nodule: Secondary | ICD-10-CM

## 2019-02-11 DIAGNOSIS — M2550 Pain in unspecified joint: Secondary | ICD-10-CM | POA: Diagnosis not present

## 2019-02-11 DIAGNOSIS — G8929 Other chronic pain: Secondary | ICD-10-CM | POA: Insufficient documentation

## 2019-02-11 DIAGNOSIS — R5383 Other fatigue: Secondary | ICD-10-CM | POA: Insufficient documentation

## 2019-02-11 DIAGNOSIS — Z880 Allergy status to penicillin: Secondary | ICD-10-CM | POA: Insufficient documentation

## 2019-02-11 DIAGNOSIS — D696 Thrombocytopenia, unspecified: Secondary | ICD-10-CM | POA: Diagnosis not present

## 2019-02-11 DIAGNOSIS — Z881 Allergy status to other antibiotic agents status: Secondary | ICD-10-CM | POA: Insufficient documentation

## 2019-02-11 DIAGNOSIS — K7460 Unspecified cirrhosis of liver: Secondary | ICD-10-CM | POA: Diagnosis present

## 2019-02-11 DIAGNOSIS — M549 Dorsalgia, unspecified: Secondary | ICD-10-CM | POA: Insufficient documentation

## 2019-02-11 DIAGNOSIS — Z79899 Other long term (current) drug therapy: Secondary | ICD-10-CM | POA: Insufficient documentation

## 2019-02-11 LAB — CBC WITH DIFFERENTIAL/PLATELET
Abs Immature Granulocytes: 0.03 10*3/uL (ref 0.00–0.07)
Basophils Absolute: 0 10*3/uL (ref 0.0–0.1)
Basophils Relative: 0 %
Eosinophils Absolute: 0.2 10*3/uL (ref 0.0–0.5)
Eosinophils Relative: 3 %
HCT: 37.3 % (ref 36.0–46.0)
Hemoglobin: 12.5 g/dL (ref 12.0–15.0)
Immature Granulocytes: 1 %
Lymphocytes Relative: 17 %
Lymphs Abs: 0.9 10*3/uL (ref 0.7–4.0)
MCH: 30.6 pg (ref 26.0–34.0)
MCHC: 33.5 g/dL (ref 30.0–36.0)
MCV: 91.2 fL (ref 80.0–100.0)
Monocytes Absolute: 0.4 10*3/uL (ref 0.1–1.0)
Monocytes Relative: 7 %
Neutro Abs: 4 10*3/uL (ref 1.7–7.7)
Neutrophils Relative %: 72 %
Platelets: 128 10*3/uL — ABNORMAL LOW (ref 150–400)
RBC: 4.09 MIL/uL (ref 3.87–5.11)
RDW: 12.8 % (ref 11.5–15.5)
WBC: 5.5 10*3/uL (ref 4.0–10.5)
nRBC: 0 % (ref 0.0–0.2)

## 2019-02-11 LAB — COMPREHENSIVE METABOLIC PANEL
ALT: 25 U/L (ref 0–44)
AST: 26 U/L (ref 15–41)
Albumin: 4.4 g/dL (ref 3.5–5.0)
Alkaline Phosphatase: 43 U/L (ref 38–126)
Anion gap: 10 (ref 5–15)
BUN: 34 mg/dL — ABNORMAL HIGH (ref 8–23)
CO2: 27 mmol/L (ref 22–32)
Calcium: 9.9 mg/dL (ref 8.9–10.3)
Chloride: 102 mmol/L (ref 98–111)
Creatinine, Ser: 1.12 mg/dL — ABNORMAL HIGH (ref 0.44–1.00)
GFR calc Af Amer: 52 mL/min — ABNORMAL LOW (ref >60)
GFR calc non Af Amer: 45 mL/min — ABNORMAL LOW (ref >60)
Glucose, Bld: 134 mg/dL — ABNORMAL HIGH (ref 70–99)
Potassium: 4.1 mmol/L (ref 3.5–5.1)
Sodium: 139 mmol/L (ref 135–145)
Total Bilirubin: 0.7 mg/dL (ref 0.3–1.2)
Total Protein: 7.8 g/dL (ref 6.5–8.1)

## 2019-02-11 NOTE — Assessment & Plan Note (Addendum)
#  Mild thrombocytopenia-intermittent.  Platelets 120s to 140s.  Cirrhosis/ITP.  Continue surveillance no treatment.  # Liver lesions in a cirrhotic liver.-MRI liver poor quality/AFP initially 17; FEB 2020- Normal.  AFP from today pending.  Ultrasound right upper quadrant ordered. Recommend continued follow-up with GI for surveillance of [place].   # Back pain status post kyphoplasty improved-stable  #History of smoking right lower lobe 6 mm nodule/CT scan fall 2018; CT March 2020- STABLE; benign.  No further follow-up imaging needed.  #With regards to follow-up discussed with patient that is clinically patient is stable from hematology standpoint-should follow-up with PCP.  From GI/cirrhosis standpoint-should continue follow-up with GI/HCC surveillance.  She will follow-up with [place] only as needed.  Patient/daughter agreement.  # DISPOSITION: # US abdomen in 1 week # Follow up as needed-Dr.B  CC: Dr. Ginette Pitman

## 2019-02-11 NOTE — Progress Notes (Signed)
[facility] CONSULT NOTE  Patient Care Team: Tracie Harrier, MD as PCP - General (Internal Medicine) Clent Jacks, RN as Registered Nurse  CHIEF COMPLAINTS/PURPOSE OF CONSULTATION: Liver lesions  # sep 2018- Liver lesions/cirrhotic liver [AFp- 16]; MRI liver-poor quality  # Hypoglycemia [sep [id number]; resolved   # EF- 20% [Dr.Kowalski- cath/ no blocks]  #  Oncology History   No history exists.     HISTORY OF PRESENTING ILLNESS: Patient speaks minimal English; spoke to daughter.   Jacqueline [name] 85 y.o.  female history of cirrhosis unclear etiology currently compensated/ liver lesion of unclear etiology/mild thrombocytopenia is here for follow-up.  Patient was recently admitted to hospital for cardiac/chest pain.  However she is currently doing well.  She is currently trying to build up her strength especially lower extremities.  No falls.  No unusual abdominal pain nausea vomiting.  Chronic joint pains.   Review of Systems  Constitutional: Positive for malaise/fatigue. Negative for chills, diaphoresis, fever and weight loss.  HENT: Negative for nosebleeds and sore throat.   Eyes: Negative for double vision.  Respiratory: Negative for cough, hemoptysis, sputum production, shortness of breath and wheezing.   Cardiovascular: Negative for chest pain, palpitations, orthopnea and leg swelling.  Gastrointestinal: Negative for abdominal pain, blood in stool, constipation, diarrhea, heartburn, melena, nausea and vomiting.  Genitourinary: Negative for dysuria, frequency and urgency.  Musculoskeletal: Positive for back pain and joint pain.  Skin: Negative.  Negative for itching and rash.  Neurological: Negative for dizziness, tingling, focal weakness, weakness and headaches.  Endo/Heme/Allergies: Does not bruise/bleed easily.  Psychiatric/Behavioral: Negative for depression. The patient is not nervous/anxious and does not have insomnia.      MEDICAL HISTORY:   Past Medical History:  Diagnosis Date  . Anemia   . CHF (congestive heart failure) ([place])   . Diabetes mellitus without complication ([place]) [id number]   Currently no high BS.  Has come off meds.  But having episodes of low BS now.  . Dilated idiopathic cardiomyopathy ([place])   . GERD (gastroesophageal reflux disease)   . Hyperlipidemia   . Hypertension   . Hypothyroidism   . Osteoporosis   . Renal disorder    stage 3    SURGICAL HISTORY: Past Surgical History:  Procedure Laterality Date  . BUNIONECTOMY    . COLONOSCOPY    . ESOPHAGOGASTRODUODENOSCOPY (EGD) WITH PROPOFOL N/A 10/15/2017   Procedure: ESOPHAGOGASTRODUODENOSCOPY (EGD) WITH PROPOFOL;  Surgeon: Lollie Sails, MD;  Location: [facility] ENDOSCOPY;  Service: Endoscopy;  Laterality: N/A;  . KYPHOPLASTY N/A 06/12/2017   Procedure: [id number];  Surgeon: Hessie Knows, MD;  Location: [facility] ORS;  Service: Orthopedics;  Laterality: N/A;  . TONSILLECTOMY      SOCIAL HISTORY: Social History   Socioeconomic History  . Marital status: Widowed    Spouse name: Not on file  . Number of children: Not on file  . Years of education: Not on file  . Highest education level: Not on file  Occupational History  . Not on file  Social Needs  . Financial resource strain: Not on file  . Food insecurity    Worry: Not on file    Inability: Not on file  . Transportation needs    Medical: Not on file    Non-medical: Not on file  Tobacco Use  . Smoking status: Former Smoker    Quit date: 04/18/1984    Years since quitting: 34.8  . Smokeless tobacco: Never Used  Substance and Sexual Activity  .  Alcohol use: No  . Drug use: No  . Sexual activity: Never  Lifestyle  . Physical activity    Days per week: Not on file    Minutes per session: Not on file  . Stress: Not on file  Relationships  . Social Herbalist on phone: Not on file    Gets together: Not on file    Attends religious service: Not on file    Active member of  club or organization: Not on file    Attends meetings of clubs or organizations: Not on file    Relationship status: Not on file  . Intimate partner violence    Fear of current or ex partner: Not on file    Emotionally abused: Not on file    Physically abused: Not on file    Forced sexual activity: Not on file  Other Topics Concern  . Not on file  Social History Narrative  . Not on file    FAMILY HISTORY: Family History  Problem Relation Age of Onset  . Premature CHD Brother     ALLERGIES:  is allergic to ciprofloxacin; digoxin and related; erythromycin; lopid [gemfibrozil]; nitrofurantoin; spironolactone; cefuroxime; and penicillins.  MEDICATIONS:  Current Outpatient Medications  Medication Sig Dispense Refill  . alendronate (FOSAMAX) 70 MG tablet Take 70 mg by mouth every 7 (seven) days.    Marland Kitchen aspirin EC 81 MG tablet Take 81 mg by mouth daily.    Marland Kitchen atorvastatin (LIPITOR) 10 MG tablet Take 1 tablet by mouth daily.    . carvedilol (COREG) 12.5 MG tablet Take by mouth 2 (two) times daily with a meal.     . ergocalciferol (VITAMIN D2) 50000 units capsule Take 50,000 Units by mouth once a week.    Marland Kitchen glucose blood (FREESTYLE LITE) test strip USE TO CHECK BLOOD SUGARS TWICE A DAY    . iron polysaccharides (NIFEREX) 150 MG capsule Take 150 mg by mouth daily.    . isosorbide mononitrate (IMDUR) 60 MG 24 hr tablet Take 0.5 tablets (30 mg total) by mouth daily. 30 tablet 1  . KLOR-CON M10 10 MEQ tablet Take 10 mEq by mouth daily.    Marland Kitchen levothyroxine (SYNTHROID, LEVOTHROID) 25 MCG tablet Take 25 mcg by mouth daily before breakfast.    . pantoprazole (PROTONIX) 40 MG tablet Take 40 mg by mouth daily.    . sertraline (ZOLOFT) 50 MG tablet Take 50 mg by mouth daily.    Marland Kitchen torsemide (DEMADEX) 10 MG tablet Take 1 tablet (10 mg total) by mouth daily. 30 tablet 0   No current facility-administered medications for this visit.     PHYSICAL EXAMINATION: ECOG PERFORMANCE STATUS: 0 -  Asymptomatic  Vitals:   02/11/19 1008  BP: 127/72  Pulse: 66  Resp: 20  Temp: 98.1 F (36.7 C)  SpO2: 98%   Filed Weights   02/11/19 1008  Weight: 134 lb (60.8 kg)    Physical Exam  Constitutional: She is oriented to person, place, and time.  Frail-appearing female patient.  She is in a wheelchair.  Alone.  O2 nasal cannula.  HENT:  Head: Normocephalic and atraumatic.  Mouth/Throat: Oropharynx is clear and moist. No oropharyngeal exudate.  Eyes: Pupils are equal, round, and reactive to light.  Neck: Normal range of motion. Neck supple.  Cardiovascular: Normal rate and regular rhythm.  Pulmonary/Chest: Breath sounds normal. No respiratory distress. She has no wheezes.  Abdominal: Soft. Bowel sounds are normal. She exhibits no distension and no  mass. There is no abdominal tenderness. There is no rebound and no guarding.  Musculoskeletal: Normal range of motion.        General: No tenderness or edema.  Neurological: She is alert and oriented to person, place, and time.  Skin: Skin is warm.  Psychiatric: Affect normal.    LABORATORY DATA:  I have reviewed the data as listed Lab Results  Component Value Date   WBC 5.5 02/11/2019   HGB 12.5 02/11/2019   HCT 37.3 02/11/2019   MCV 91.2 02/11/2019   PLT 128 (L) 02/11/2019   Recent Labs    08/04/18 1003  01/06/19 0110 01/08/19 0442 02/11/19 0946  NA 141   < > 141 139 139  K 4.0   < > 3.8 4.1 4.1  CL 104   < > 108 108 102  CO2 28   < > 27 25 27   GLUCOSE 109*   < > 123* 116* 134*  BUN 34*   < > 37* 25* 34*  CREATININE 0.96   < > 1.04* 0.90 1.12*  CALCIUM 9.2   < > 8.5* 8.8* 9.9  GFRNONAA 55*   < > 49* 59* 45*  GFRAA >60   < > 57* >60 52*  PROT 7.3  --  6.6  --  7.8  ALBUMIN 4.2  --  4.0  --  4.4  AST 25  --  24  --  26  ALT 19  --  22  --  25  ALKPHOS 42  --  40  --  43  BILITOT 0.8  --  0.6  --  0.7   < > = values in this interval not displayed.    RADIOGRAPHIC STUDIES: I have personally reviewed the  radiological images as listed and agreed with the findings in the report. No results found.  ASSESSMENT & PLAN:   Thrombocytopenia ([place]) #Mild thrombocytopenia-intermittent.  Platelets 120s to 140s.  Cirrhosis/ITP.  Continue surveillance no treatment.  # Liver lesions in a cirrhotic liver.-MRI liver poor quality/AFP initially 17; FEB 2020- Normal.  AFP from today pending.  Ultrasound right upper quadrant ordered. Recommend continued follow-up with GI for surveillance of [place].   # Back pain status post kyphoplasty improved-stable  #History of smoking right lower lobe 6 mm nodule/CT scan fall 2018; CT March 2020- STABLE; benign.  No further follow-up imaging needed.  #With regards to follow-up discussed with patient that is clinically patient is stable from hematology standpoint-should follow-up with PCP.  From GI/cirrhosis standpoint-should continue follow-up with GI/HCC surveillance.  She will follow-up with [place] only as needed.  Patient/daughter agreement.  # DISPOSITION: # US abdomen in 1 week # Follow up as needed-Dr.B  CC: Dr. Ginette Pitman  All questions were answered. The patient knows to call the clinic with any problems, questions or concerns.    Cammie Sickle, MD 02/11/2019 12:20 PM

## 2019-02-11 NOTE — Assessment & Plan Note (Deleted)
#   Liver lesions in a cirrhotic liver.-MRI liver poor quality/AFP initially 17 currently normal.  AFP from today pending.  Hold off further work-up at this time.  #Cirrhosis unclear etiology/compensated followed by GI.  Continue monitoring AFP today.  Will need ultrasound in 6 months.  #Back pain status post kyphoplasty improved-stable.  #Mild thrombocytopenia-intermittent.  Platelets 140s stable.   #History of smoking right lower lobe 6 mm nodule/CT scan fall 2018.  Recommend a follow-up CT scan the next 1 week.  Will call with results.  # DISPOSITION: # CT chest 1 week- will call with results/ultrasound right upper quadrant in 6 months. # follow up in 6 months-MD/labs-AFP/cbc/cmp-Dr.B  CC: Dr. Ginette Pitman

## 2019-02-12 LAB — AFP TUMOR MARKER: AFP, Serum, Tumor Marker: 1.7 ng/mL (ref 0.0–8.3)

## 2019-02-22 ENCOUNTER — Ambulatory Visit: Admission: RE | Admit: 2019-02-22 | Payer: Medicare Other | Source: Ambulatory Visit

## 2021-09-30 ENCOUNTER — Telehealth: Payer: Self-pay

## 2021-09-30 NOTE — Telephone Encounter (Signed)
Spoke with patient's [name] and scheduled a mychart Palliative Consult for 10/08/21 @ 2 PM. ? ?Consent obtained; updated Netsmart, Team List and Epic.  ? ?

## 2021-10-08 ENCOUNTER — Telehealth: Payer: Medicare Other | Admitting: Student

## 2021-10-08 DIAGNOSIS — Z515 Encounter for palliative care: Secondary | ICD-10-CM

## 2021-10-08 DIAGNOSIS — R0602 Shortness of breath: Secondary | ICD-10-CM

## 2021-10-08 DIAGNOSIS — R52 Pain, unspecified: Secondary | ICD-10-CM

## 2021-10-09 NOTE — Progress Notes (Signed)
? ? ?Manufacturing engineer ?Community Palliative Care Consult Note ?Telephone: [phone]  ?Fax: [phone]  ? ?Date of encounter: 10/08/2021 ?2:00pm ?PATIENT NAME: Jacqueline [name] ?[place] [address]   ?[phone] (home)  ?DOB: 10/14/1932 ?MRN: [id number] ?PRIMARY CARE PROVIDER:    ?Jacqueline Harrier, MD,  ?[place] ?[place] [address] ?[phone] ? ?REFERRING PROVIDER:   ?Jacqueline Harrier, MD ?[address] ?[facility] ?[place],  [place] [address] ?[phone] ? ?RESPONSIBLE PARTY:    ?Contact Information   ? ? Name Relation Home Work Mobile  ? Jacqueline [name] Daughter [phone]  ? Jacqueline [name], Jacqueline [name] Son [phone]    ? Jacqueline [name]   [phone]  ? ?  ? ? ?Due to the COVID-19 crisis, this visit was done via telemedicine from my office and it was initiated and consent by this patient and or family. ? ?I connected with  Jacqueline [name] OR PROXY on 10/08/2021 by a video enabled telemedicine application and verified that I am speaking with the correct person using two identifiers. ?  ?I discussed the limitations of evaluation and management by telemedicine. The patient expressed understanding and agreed to proceed. ? ? ? ?                                 ASSESSMENT AND PLAN / RECOMMENDATIONS:  ? ?Advance Care Planning/Goals of Care: Goals include to maximize quality of life and symptom management. Patient/health care surrogate gave his/her permission to discuss.Our advance care planning conversation included a discussion about:    ?The value and importance of advance care planning  ?Experiences with loved ones who have been seriously ill or have died  ?Exploration of personal, cultural or spiritual beliefs that might influence medical decisions  ?Exploration of goals of care in the event of a sudden injury or illness  ?Identification  of a healthcare agent  ?CODE STATUS: To be decided; ongoing  discussion. ? ?Education provided on palliative medicine versus hospice services.  Daughter would like to increase support in the home as patient is having increased difficulty getting to appointments, continue caregivers 2 days a week in the home.  Introduce MOST form and discuss CODE STATUS.  Family to discuss further and will complete on upcoming visit. Patient is open to hospital as needed.  ? ?Symptom Management/Plan: ? ?Shortness of breath-due to HF, chronic respiratory failure with hypoxia. Continue oxygen at 4 lpm via nasal canula.  ? ?Pain-patient with occasional back pain. Patient prefers not to take medications if possible. We discussed topical agents; will trial Biofreeze PRN. Recommend trying heat or ice to see which provides relief. ? ?Follow up Palliative Care Visit: Palliative care will continue to follow for complex medical decision making, advance care planning, and clarification of goals. Return 6-8 weeks or prn. ? ?This visit was coded based on medical decision making (MDM). ? ?PPS: 50% ? ?HOSPICE ELIGIBILITY/DIAGNOSIS: TBD ? ?Chief Complaint: Palliative Medicine initial consult.  ? ?HISTORY OF PRESENT ILLNESS:  Jacqueline [name] is a 88 y.o. year old female  with chronic systolic heart failure NYHA class 3, chronic respiratory failure with hypoxia, CKD 3B, hypertension, hyperlipidemia, Type 2 diabetes with hyperglycemia, hypothyroidism, GERD, anemia, arthritis, dilated idiopathic cardiomyopathy, liver cirrhosis.  ? ?Patient resides at home; good family support.  Patient has caregivers in the home 2 days a week.  Daughter expresses patient having increased difficulty  getting out of home and to doctors appointments.  Patient endorses shortness of breath with exertion.  She is wearing oxygen at 4 L/min.  She does endorse occasional back pain.  She denies nausea; occasional constipation. Drinks prune juice with relief for constipation.  Endorses a good appetite.  Daughter reports patient with increased  incontinence at bedtime but this has started to improve.  No recent falls or injury reported.  Patient uses walker for ambulation. ? ? ?History obtained from review of EMR, discussion with primary team, and interview with family, facility staff/caregiver and/or Jacqueline [name].  ?I reviewed available labs, medications, imaging, studies and related documents from the EMR.  Records reviewed and summarized above.  ? ?ROS ? ?General: NAD ?EYES: denies vision changes ?ENMT: denies dysphagia ?Cardiovascular: denies chest pain, DOE ?Pulmonary: denies cough, denies increased SOB ?Abdomen: endorses good appetite, occasional constipation, endorses continence of bowel ?GU: denies dysuria, endorses incontinence of urine ?MSK: increased weakness,  no falls reported ?Skin: denies rashes or wounds ?Neurological: denies pain, denies insomnia ?Psych: Endorses stable mood ?Heme/lymph/immuno: denies bruises, abnormal bleeding ? ?Physical Exam: ? ?Constitutional: NAD ?General: frail appearing ?EYES: anicteric sclera, lids intact, no discharge  ?ENMT: intact hearing, dentition intact ?CV: no LE edema ?Pulmonary:  no increased work of breathing, no cough ?Abdomen:  no ascites ?GU: deferred ?MSK: no sarcopenia, moves all extremities, ambulatory with walker ?Skin: warm and dry, no rashes or wounds on visible skin ?Neuro: + generalized weakness,  no cognitive impairment ?Psych: non-anxious affect, A and O x 3 ?Hem/lymph/immuno: no widespread bruising ?CURRENT PROBLEM LIST:  ?Patient Active Problem List  ? Diagnosis Date Noted  ? Thrombocytopenia ([place]) 02/11/2019  ? Chest pain 01/05/2019  ? Hyponatremia 06/09/2017  ? MI, acute, non ST segment elevation ([place]) 06/01/2017  ? Acute upper back pain 03/05/2017  ? Carcinoma of unknown primary ([place]) 03/05/2017  ? Liver lesion 02/27/2017  ? Diabetes ([place]) 02/24/2017  ? Chronic systolic CHF (congestive heart failure) ([place]) 02/24/2017  ? HTN (hypertension) 02/24/2017  ? GERD (gastroesophageal reflux  disease) 02/24/2017  ? Hypothyroidism 02/24/2017  ? Hypoglycemia 02/24/2017  ? Dizziness 02/18/2017  ? ?PAST MEDICAL HISTORY:  ?Active Ambulatory Problems  ?  Diagnosis Date Noted  ? Dizziness 02/18/2017  ? Diabetes ([place]) 02/24/2017  ? Chronic systolic CHF (congestive heart failure) ([place]) 02/24/2017  ? HTN (hypertension) 02/24/2017  ? GERD (gastroesophageal reflux disease) 02/24/2017  ? Hypothyroidism 02/24/2017  ? Hypoglycemia 02/24/2017  ? Liver lesion 02/27/2017  ? Acute upper back pain 03/05/2017  ? Carcinoma of unknown primary ([place]) 03/05/2017  ? MI, acute, non ST segment elevation ([place]) 06/01/2017  ? Hyponatremia 06/09/2017  ? Chest pain 01/05/2019  ? Thrombocytopenia ([place]) 02/11/2019  ? ?Resolved Ambulatory Problems  ?  Diagnosis Date Noted  ? No Resolved Ambulatory Problems  ? ?Past Medical History:  ?Diagnosis Date  ? Anemia   ? CHF (congestive heart failure) ([place])   ? Diabetes mellitus without complication ([place]) 27/25/3664  ? Dilated idiopathic cardiomyopathy ([place])   ? Hyperlipidemia   ? Hypertension   ? Osteoporosis   ? Renal disorder   ? ?SOCIAL HX:  ?Social History  ? ?Tobacco Use  ? Smoking status: Former  ?  Types: Cigarettes  ?  Quit date: 04/18/1984  ?  Years since quitting: 37.5  ? Smokeless tobacco: Never  ?Substance Use Topics  ? Alcohol use: No  ? ?FAMILY HX:  ?Family History  ?Problem Relation Age of Onset  ? Premature CHD Brother   ?   ? ?  ALLERGIES:  ?Allergies  ?Allergen Reactions  ? Ciprofloxacin   ? Digoxin And Related   ? Erythromycin   ? Lopid [Gemfibrozil]   ? Nitrofurantoin   ? Spironolactone Other (See Comments)  ?  Hospitalized for dehydration  ? Cefuroxime Rash  ? Penicillins Rash  ?  Has patient had a PCN reaction causing immediate rash, facial/tongue/throat swelling, SOB or lightheadedness with hypotension: Unknown ?Has patient had a PCN reaction causing severe rash involving mucus membranes or skin necrosis: No ?Has patient had a PCN reaction that required hospitalization: No ?Has  patient had a PCN reaction occurring within the last 10 years: No ?If all of the above answers are "NO", then may proceed with Cephalosporin use. ?  ?   ?PERTINENT MEDICATIONS:  ?Outpatient Encounter Medications as

## 2021-11-18 ENCOUNTER — Emergency Department: Payer: Medicare Other

## 2021-11-18 ENCOUNTER — Encounter: Payer: Self-pay | Admitting: Internal Medicine

## 2021-11-18 ENCOUNTER — Other Ambulatory Visit: Payer: Self-pay

## 2021-11-18 ENCOUNTER — Inpatient Hospital Stay
Admission: EM | Admit: 2021-11-18 | Discharge: 2021-11-25 | DRG: 291 | Disposition: A | Discharge status: Skilled Nursing Facility | Payer: Medicare Other | Attending: Internal Medicine | Admitting: Internal Medicine

## 2021-11-18 ENCOUNTER — Inpatient Hospital Stay
Admit: 2021-11-18 | Discharge: 2021-11-18 | Disposition: A | Discharge status: Home / Self Care | Payer: Medicare Other | Attending: Family Medicine | Admitting: Family Medicine

## 2021-11-18 DIAGNOSIS — D696 Thrombocytopenia, unspecified: Secondary | ICD-10-CM | POA: Diagnosis present

## 2021-11-18 DIAGNOSIS — Z880 Allergy status to penicillin: Secondary | ICD-10-CM

## 2021-11-18 DIAGNOSIS — R531 Weakness: Secondary | ICD-10-CM | POA: Diagnosis not present

## 2021-11-18 DIAGNOSIS — I161 Hypertensive emergency: Secondary | ICD-10-CM | POA: Diagnosis present

## 2021-11-18 DIAGNOSIS — Z79899 Other long term (current) drug therapy: Secondary | ICD-10-CM | POA: Diagnosis not present

## 2021-11-18 DIAGNOSIS — J9610 Chronic respiratory failure, unspecified whether with hypoxia or hypercapnia: Secondary | ICD-10-CM | POA: Diagnosis not present

## 2021-11-18 DIAGNOSIS — I509 Heart failure, unspecified: Secondary | ICD-10-CM

## 2021-11-18 DIAGNOSIS — Z20822 Contact with and (suspected) exposure to covid-19: Secondary | ICD-10-CM | POA: Diagnosis present

## 2021-11-18 DIAGNOSIS — I5043 Acute on chronic combined systolic (congestive) and diastolic (congestive) heart failure: Secondary | ICD-10-CM

## 2021-11-18 DIAGNOSIS — E119 Type 2 diabetes mellitus without complications: Secondary | ICD-10-CM | POA: Diagnosis not present

## 2021-11-18 DIAGNOSIS — D631 Anemia in chronic kidney disease: Secondary | ICD-10-CM | POA: Diagnosis present

## 2021-11-18 DIAGNOSIS — Z66 Do not resuscitate: Secondary | ICD-10-CM | POA: Diagnosis present

## 2021-11-18 DIAGNOSIS — J9621 Acute and chronic respiratory failure with hypoxia: Secondary | ICD-10-CM | POA: Diagnosis present

## 2021-11-18 DIAGNOSIS — R7989 Other specified abnormal findings of blood chemistry: Secondary | ICD-10-CM

## 2021-11-18 DIAGNOSIS — K0889 Other specified disorders of teeth and supporting structures: Secondary | ICD-10-CM

## 2021-11-18 DIAGNOSIS — Z881 Allergy status to other antibiotic agents status: Secondary | ICD-10-CM | POA: Diagnosis not present

## 2021-11-18 DIAGNOSIS — N1831 Chronic kidney disease, stage 3a: Secondary | ICD-10-CM | POA: Diagnosis present

## 2021-11-18 DIAGNOSIS — N3000 Acute cystitis without hematuria: Secondary | ICD-10-CM | POA: Diagnosis not present

## 2021-11-18 DIAGNOSIS — I10 Essential (primary) hypertension: Secondary | ICD-10-CM | POA: Diagnosis present

## 2021-11-18 DIAGNOSIS — E785 Hyperlipidemia, unspecified: Secondary | ICD-10-CM | POA: Diagnosis present

## 2021-11-18 DIAGNOSIS — I5022 Chronic systolic (congestive) heart failure: Secondary | ICD-10-CM | POA: Diagnosis not present

## 2021-11-18 DIAGNOSIS — N183 Chronic kidney disease, stage 3 unspecified: Secondary | ICD-10-CM

## 2021-11-18 DIAGNOSIS — E039 Hypothyroidism, unspecified: Secondary | ICD-10-CM | POA: Diagnosis present

## 2021-11-18 DIAGNOSIS — Z515 Encounter for palliative care: Secondary | ICD-10-CM | POA: Diagnosis not present

## 2021-11-18 DIAGNOSIS — I130 Hypertensive heart and chronic kidney disease with heart failure and stage 1 through stage 4 chronic kidney disease, or unspecified chronic kidney disease: Secondary | ICD-10-CM | POA: Diagnosis present

## 2021-11-18 DIAGNOSIS — I5023 Acute on chronic systolic (congestive) heart failure: Secondary | ICD-10-CM | POA: Diagnosis present

## 2021-11-18 DIAGNOSIS — R042 Hemoptysis: Secondary | ICD-10-CM | POA: Diagnosis present

## 2021-11-18 DIAGNOSIS — I2510 Atherosclerotic heart disease of native coronary artery without angina pectoris: Secondary | ICD-10-CM | POA: Diagnosis present

## 2021-11-18 DIAGNOSIS — I5033 Acute on chronic diastolic (congestive) heart failure: Secondary | ICD-10-CM

## 2021-11-18 DIAGNOSIS — M810 Age-related osteoporosis without current pathological fracture: Secondary | ICD-10-CM | POA: Diagnosis present

## 2021-11-18 DIAGNOSIS — I420 Dilated cardiomyopathy: Secondary | ICD-10-CM | POA: Diagnosis present

## 2021-11-18 DIAGNOSIS — Z888 Allergy status to other drugs, medicaments and biological substances status: Secondary | ICD-10-CM

## 2021-11-18 DIAGNOSIS — E1122 Type 2 diabetes mellitus with diabetic chronic kidney disease: Secondary | ICD-10-CM | POA: Diagnosis present

## 2021-11-18 DIAGNOSIS — Z7982 Long term (current) use of aspirin: Secondary | ICD-10-CM

## 2021-11-18 DIAGNOSIS — R778 Other specified abnormalities of plasma proteins: Secondary | ICD-10-CM

## 2021-11-18 DIAGNOSIS — J9611 Chronic respiratory failure with hypoxia: Secondary | ICD-10-CM

## 2021-11-18 DIAGNOSIS — J9601 Acute respiratory failure with hypoxia: Principal | ICD-10-CM

## 2021-11-18 DIAGNOSIS — I248 Other forms of acute ischemic heart disease: Secondary | ICD-10-CM | POA: Diagnosis present

## 2021-11-18 DIAGNOSIS — Z23 Encounter for immunization: Secondary | ICD-10-CM

## 2021-11-18 DIAGNOSIS — K219 Gastro-esophageal reflux disease without esophagitis: Secondary | ICD-10-CM | POA: Diagnosis not present

## 2021-11-18 DIAGNOSIS — I4891 Unspecified atrial fibrillation: Secondary | ICD-10-CM | POA: Diagnosis not present

## 2021-11-18 DIAGNOSIS — Z87891 Personal history of nicotine dependence: Secondary | ICD-10-CM

## 2021-11-18 DIAGNOSIS — Z7983 Long term (current) use of bisphosphonates: Secondary | ICD-10-CM

## 2021-11-18 DIAGNOSIS — I214 Non-ST elevation (NSTEMI) myocardial infarction: Secondary | ICD-10-CM | POA: Diagnosis not present

## 2021-11-18 DIAGNOSIS — Z9981 Dependence on supplemental oxygen: Secondary | ICD-10-CM

## 2021-11-18 DIAGNOSIS — Z79890 Hormone replacement therapy (postmenopausal): Secondary | ICD-10-CM

## 2021-11-18 DIAGNOSIS — I440 Atrioventricular block, first degree: Secondary | ICD-10-CM | POA: Diagnosis present

## 2021-11-18 DIAGNOSIS — I4460 Unspecified fascicular block: Secondary | ICD-10-CM | POA: Diagnosis present

## 2021-11-18 HISTORY — DX: Heart failure, unspecified: I50.9

## 2021-11-18 LAB — COMPREHENSIVE METABOLIC PANEL
ALT: 24 U/L (ref 0–44)
AST: 29 U/L (ref 15–41)
Albumin: 3.8 g/dL (ref 3.5–5.0)
Alkaline Phosphatase: 71 U/L (ref 38–126)
Anion gap: 10 (ref 5–15)
BUN: UNDETERMINED mg/dL (ref 8–23)
CO2: 31 mmol/L (ref 22–32)
Calcium: 8.8 mg/dL — ABNORMAL LOW (ref 8.9–10.3)
Chloride: 101 mmol/L (ref 98–111)
Creatinine, Ser: UNDETERMINED mg/dL (ref 0.44–1.00)
Glucose, Bld: 132 mg/dL — ABNORMAL HIGH (ref 70–99)
Potassium: 3.7 mmol/L (ref 3.5–5.1)
Sodium: 142 mmol/L (ref 135–145)
Total Bilirubin: 1.1 mg/dL (ref 0.3–1.2)
Total Protein: 7.7 g/dL (ref 6.5–8.1)

## 2021-11-18 LAB — CBC WITH DIFFERENTIAL/PLATELET
Abs Immature Granulocytes: 0.02 10*3/uL (ref 0.00–0.07)
Abs Immature Granulocytes: 0.03 10*3/uL (ref 0.00–0.07)
Basophils Absolute: 0 10*3/uL (ref 0.0–0.1)
Basophils Absolute: 0 10*3/uL (ref 0.0–0.1)
Basophils Relative: 0 %
Basophils Relative: 0 %
Eosinophils Absolute: 0.1 10*3/uL (ref 0.0–0.5)
Eosinophils Absolute: 0.1 10*3/uL (ref 0.0–0.5)
Eosinophils Relative: 3 %
Eosinophils Relative: 1 %
HCT: 53.6 % — ABNORMAL HIGH (ref 36.0–46.0)
HCT: 58.3 % — ABNORMAL HIGH (ref 36.0–46.0)
Hemoglobin: 16.7 g/dL — ABNORMAL HIGH (ref 12.0–15.0)
Hemoglobin: 18.2 g/dL — ABNORMAL HIGH (ref 12.0–15.0)
Immature Granulocytes: 0 %
Immature Granulocytes: 0 %
Lymphocytes Relative: 40 %
Lymphocytes Relative: 22 %
Lymphs Abs: 1.9 10*3/uL (ref 0.7–4.0)
Lymphs Abs: 2.6 10*3/uL (ref 0.7–4.0)
MCH: 29 pg (ref 26.0–34.0)
MCH: 29.5 pg (ref 26.0–34.0)
MCHC: 31.2 g/dL (ref 30.0–36.0)
MCHC: 31.2 g/dL (ref 30.0–36.0)
MCV: 93.1 fL (ref 80.0–100.0)
MCV: 94.5 fL (ref 80.0–100.0)
Monocytes Absolute: 0.3 10*3/uL (ref 0.1–1.0)
Monocytes Absolute: 0.8 10*3/uL (ref 0.1–1.0)
Monocytes Relative: 7 %
Monocytes Relative: 7 %
Neutro Abs: 2.4 10*3/uL (ref 1.7–7.7)
Neutro Abs: 8.2 10*3/uL — ABNORMAL HIGH (ref 1.7–7.7)
Neutrophils Relative %: 50 %
Neutrophils Relative %: 70 %
Platelets: 101 10*3/uL — ABNORMAL LOW (ref 150–400)
Platelets: 110 10*3/uL — ABNORMAL LOW (ref 150–400)
RBC: 5.76 MIL/uL — ABNORMAL HIGH (ref 3.87–5.11)
RBC: 6.17 MIL/uL — ABNORMAL HIGH (ref 3.87–5.11)
RDW: 14.5 % (ref 11.5–15.5)
RDW: 14.5 % (ref 11.5–15.5)
WBC: 4.8 10*3/uL (ref 4.0–10.5)
WBC: 11.7 10*3/uL — ABNORMAL HIGH (ref 4.0–10.5)
nRBC: 0 % (ref 0.0–0.2)
nRBC: 0 % (ref 0.0–0.2)

## 2021-11-18 LAB — BASIC METABOLIC PANEL
Anion gap: 5 (ref 5–15)
BUN: 31 mg/dL — ABNORMAL HIGH (ref 8–23)
CO2: 30 mmol/L (ref 22–32)
Calcium: 8.5 mg/dL — ABNORMAL LOW (ref 8.9–10.3)
Chloride: 105 mmol/L (ref 98–111)
Creatinine, Ser: 1.15 mg/dL — ABNORMAL HIGH (ref 0.44–1.00)
GFR, Estimated: 46 mL/min — ABNORMAL LOW (ref >60)
Glucose, Bld: 158 mg/dL — ABNORMAL HIGH (ref 70–99)
Potassium: 3.7 mmol/L (ref 3.5–5.1)
Sodium: 140 mmol/L (ref 135–145)

## 2021-11-18 LAB — BUN: BUN: 26 mg/dL — ABNORMAL HIGH (ref 8–23)

## 2021-11-18 LAB — RESP PANEL BY RT-PCR (FLU A&B, COVID) ARPGX2
Influenza A by PCR: NEGATIVE
Influenza B by PCR: NEGATIVE
SARS Coronavirus 2 by RT PCR: NEGATIVE

## 2021-11-18 LAB — GLUCOSE, CAPILLARY
Glucose-Capillary: 143 mg/dL — ABNORMAL HIGH (ref 70–99)
Glucose-Capillary: 127 mg/dL — ABNORMAL HIGH (ref 70–99)

## 2021-11-18 LAB — MAGNESIUM: Magnesium: 2.4 mg/dL (ref 1.7–2.4)

## 2021-11-18 LAB — TROPONIN I (HIGH SENSITIVITY)
Troponin I (High Sensitivity): 56 ng/L — ABNORMAL HIGH (ref <18)
Troponin I (High Sensitivity): 57 ng/L — ABNORMAL HIGH (ref <18)

## 2021-11-18 LAB — CBG MONITORING, ED: Glucose-Capillary: 160 mg/dL — ABNORMAL HIGH (ref 70–99)

## 2021-11-18 LAB — BRAIN NATRIURETIC PEPTIDE: B Natriuretic Peptide: 1670.7 pg/mL — ABNORMAL HIGH (ref 0.0–100.0)

## 2021-11-18 LAB — CREATININE, SERUM
Creatinine, Ser: 1.12 mg/dL — ABNORMAL HIGH (ref 0.44–1.00)
GFR, Estimated: 48 mL/min — ABNORMAL LOW (ref >60)

## 2021-11-18 LAB — TSH: TSH: 2.925 u[IU]/mL (ref 0.350–4.500)

## 2021-11-18 MED ORDER — LEVOTHYROXINE SODIUM 25 MCG PO TABS
25.0000 ug | ORAL_TABLET | Freq: Every day | ORAL | Status: DC
  Administered 2021-11-19 – 2021-11-25 (×7): 25 ug via ORAL
  Filled 2021-11-18 (×7): qty 1

## 2021-11-18 MED ORDER — ONDANSETRON HCL 4 MG/2ML IJ SOLN: 4.0000 mg | Freq: Four times a day (QID) | INTRAMUSCULAR | Status: DC | PRN

## 2021-11-18 MED ORDER — ISOSORBIDE MONONITRATE ER 30 MG PO TB24
30.0000 mg | ORAL_TABLET | Freq: Every day | ORAL | Status: DC
  Administered 2021-11-18 – 2021-11-25 (×8): 30 mg via ORAL
  Filled 2021-11-18 (×8): qty 1

## 2021-11-18 MED ORDER — NITROGLYCERIN IN D5W 200-5 MCG/ML-% IV SOLN
0.0000 ug/min | INTRAVENOUS | Status: DC
  Administered 2021-11-18: 40 ug/min via INTRAVENOUS
  Filled 2021-11-18: qty 250

## 2021-11-18 MED ORDER — INSULIN ASPART 100 UNIT/ML IJ SOLN: 0.0000 [IU] | Freq: Every day | INTRAMUSCULAR | Status: DC

## 2021-11-18 MED ORDER — OXYCODONE HCL 5 MG PO TABS
5.0000 mg | ORAL_TABLET | ORAL | Status: DC | PRN
  Administered 2021-11-20: 5 mg via ORAL
  Filled 2021-11-18: qty 1

## 2021-11-18 MED ORDER — FUROSEMIDE 10 MG/ML IJ SOLN
40.0000 mg | Freq: Once | INTRAMUSCULAR | Status: AC
Start: 2021-11-18 — End: 2021-11-18
  Administered 2021-11-18: 40 mg via INTRAVENOUS
  Filled 2021-11-18: qty 4

## 2021-11-18 MED ORDER — SERTRALINE HCL 50 MG PO TABS
50.0000 mg | ORAL_TABLET | Freq: Every day | ORAL | Status: DC
  Administered 2021-11-18 – 2021-11-25 (×8): 50 mg via ORAL
  Filled 2021-11-18 (×8): qty 1

## 2021-11-18 MED ORDER — INSULIN ASPART 100 UNIT/ML IJ SOLN
0.0000 [IU] | Freq: Three times a day (TID) | INTRAMUSCULAR | Status: DC
  Administered 2021-11-18 – 2021-11-19 (×2): 3 [IU] via SUBCUTANEOUS
  Administered 2021-11-19 – 2021-11-21 (×4): 2 [IU] via SUBCUTANEOUS
  Administered 2021-11-21 (×2): 3 [IU] via SUBCUTANEOUS
  Administered 2021-11-22 (×2): 2 [IU] via SUBCUTANEOUS
  Administered 2021-11-22 – 2021-11-23 (×2): 5 [IU] via SUBCUTANEOUS
  Administered 2021-11-23 – 2021-11-24 (×2): 2 [IU] via SUBCUTANEOUS
  Administered 2021-11-24 (×2): 3 [IU] via SUBCUTANEOUS
  Administered 2021-11-25 (×2): 2 [IU] via SUBCUTANEOUS
  Administered 2021-11-25: 5 [IU] via SUBCUTANEOUS
  Filled 2021-11-18 (×19): qty 1

## 2021-11-18 MED ORDER — ASPIRIN 81 MG PO TBEC
81.0000 mg | DELAYED_RELEASE_TABLET | Freq: Every day | ORAL | Status: DC
  Administered 2021-11-18 – 2021-11-24 (×7): 81 mg via ORAL
  Filled 2021-11-18 (×7): qty 1

## 2021-11-18 MED ORDER — ACETAMINOPHEN 325 MG PO TABS: 650.0000 mg | ORAL_TABLET | Freq: Four times a day (QID) | ORAL | Status: DC | PRN

## 2021-11-18 MED ORDER — ACETAMINOPHEN 650 MG RE SUPP: 650.0000 mg | Freq: Four times a day (QID) | RECTAL | Status: DC | PRN

## 2021-11-18 MED ORDER — FUROSEMIDE 10 MG/ML IJ SOLN
40.0000 mg | Freq: Every day | INTRAMUSCULAR | Status: DC
  Administered 2021-11-18 – 2021-11-24 (×7): 40 mg via INTRAVENOUS
  Filled 2021-11-18 (×8): qty 4

## 2021-11-18 MED ORDER — PANTOPRAZOLE SODIUM 40 MG PO TBEC
40.0000 mg | DELAYED_RELEASE_TABLET | Freq: Every day | ORAL | Status: DC
  Administered 2021-11-18 – 2021-11-25 (×8): 40 mg via ORAL
  Filled 2021-11-18 (×8): qty 1

## 2021-11-18 MED ORDER — CARVEDILOL 12.5 MG PO TABS
12.5000 mg | ORAL_TABLET | Freq: Two times a day (BID) | ORAL | Status: DC
  Administered 2021-11-18 – 2021-11-25 (×14): 12.5 mg via ORAL
  Filled 2021-11-18 (×12): qty 1
  Filled 2021-11-18: qty 2
  Filled 2021-11-18 (×2): qty 1

## 2021-11-18 MED ORDER — ONDANSETRON HCL 4 MG PO TABS: 4.0000 mg | ORAL_TABLET | Freq: Four times a day (QID) | ORAL | Status: DC | PRN

## 2021-11-18 MED ORDER — HEPARIN SODIUM (PORCINE) 5000 UNIT/ML IJ SOLN
5000.0000 [IU] | Freq: Three times a day (TID) | INTRAMUSCULAR | Status: DC
  Administered 2021-11-18 – 2021-11-24 (×19): 5000 [IU] via SUBCUTANEOUS
  Filled 2021-11-18 (×19): qty 1

## 2021-11-18 NOTE — ED Provider Notes (Signed)
[facility] Provider Note    Event Date/Time   First MD Initiated Contact with Patient 11/18/21 0120     (approximate)   History   Shortness of Breath   HPI  Jacqueline [name] is a 88 y.o. female with a history of CHF (EF 20%), diabetes, anemia, hypothyroidism, hypertension, hyperlipidemia who presents for evaluation of shortness of breath.  According to EMS patient is coming from home for shortness of breath.  She was found satting in the 70s per fire department.  She is on 4 L of oxygen at baseline at home.  According to the daughter patient has had a mild cough for the last week.  They have noticed a little bit more swelling of her legs.  This evening patient called her daughter thinking that her oxygen was not working.  When daughter got to the house the oxygen was working but patient sats were 88% which is atypical for her.  Daughter assisted patient to the bathroom and upon return patient became extremely short of breath, she looked purple and was having a hard time breathing when EMS was called.  She was also complaining of some heaviness in her chest.   Past Medical History:  Diagnosis Date   Anemia    CHF (congestive heart failure) (HCC)    Diabetes mellitus without complication ([place]) 58/02/9832   Currently no high BS.  Has come off meds.  But having episodes of low BS now.   Dilated idiopathic cardiomyopathy (HCC)    GERD (gastroesophageal reflux disease)    Hyperlipidemia    Hypertension    Hypothyroidism    Osteoporosis    Renal disorder    stage 3    Past Surgical History:  Procedure Laterality Date   BUNIONECTOMY     COLONOSCOPY     ESOPHAGOGASTRODUODENOSCOPY (EGD) WITH PROPOFOL N/A 10/15/2017   Procedure: ESOPHAGOGASTRODUODENOSCOPY (EGD) WITH PROPOFOL;  Surgeon: Lollie Sails, MD;  Location: [facility] ENDOSCOPY;  Service: Endoscopy;  Laterality: N/A;   KYPHOPLASTY N/A 06/12/2017   Procedure: [id number];  Surgeon: Hessie Knows, MD;   Location: [facility] ORS;  Service: Orthopedics;  Laterality: N/A;   TONSILLECTOMY       Physical Exam   Triage Vital Signs: ED Triage Vitals  Enc Vitals Group     BP 11/18/21 0132 (!) 192/85     Pulse Rate 11/18/21 0132 66     Resp 11/18/21 0132 (!) 22     Temp --      Temp src --      SpO2 11/18/21 0126 100 %     Weight --      Height --      Head Circumference --      Peak Flow --      Pain Score --      Pain Loc --      Pain Edu? --      Excl. in [place]? --     Most recent vital signs: Vitals:   11/18/21 0145 11/18/21 0200  BP: (!) 162/73 (!) 144/68  Pulse: 63 67  Resp: (!) 26 (!) 24  SpO2: 100% 100%     Constitutional: Alert and oriented.  Moderate respiratory distress HEENT:      Head: Normocephalic and atraumatic.         Eyes: Conjunctivae are normal. Sclera is non-icteric.       Mouth/Throat: Mucous membranes are moist.       Neck: Supple with no signs of meningismus.  Cardiovascular: Regular rate and rhythm. No murmurs, gallops, or rubs. 2+ symmetrical distal pulses are present in all extremities.  Respiratory: Moderate respiratory distress, increased work of breathing, tachypneic on CPAP with decreased air movement bilaterally Gastrointestinal: Soft, non tender. Musculoskeletal: 3+ pitting edema bilaterally with purple discoloration of the toes which according to the daughter is normal. Neurologic: Normal speech and language. Face is symmetric. Moving all extremities. No gross focal neurologic deficits are appreciated. Skin: Skin is warm, dry and intact. No rash noted. Psychiatric: Mood and affect are normal. Speech and behavior are normal.  ED Results / Procedures / Treatments   Labs (all labs ordered are listed, but only abnormal results are displayed) Labs Reviewed  CBC WITH DIFFERENTIAL/PLATELET - Abnormal; Notable for the following components:      Result Value   RBC 5.76 (*)    Hemoglobin 16.7 (*)    HCT 53.6 (*)    Platelets 101 (*)    All other  components within normal limits  BRAIN NATRIURETIC PEPTIDE - Abnormal; Notable for the following components:   B Natriuretic Peptide 1,670.7 (*)    All other components within normal limits  BASIC METABOLIC PANEL - Abnormal; Notable for the following components:   Glucose, Bld 158 (*)    BUN 31 (*)    Creatinine, Ser 1.15 (*)    Calcium 8.5 (*)    GFR, Estimated 46 (*)    All other components within normal limits  TROPONIN I (HIGH SENSITIVITY) - Abnormal; Notable for the following components:   Troponin I (High Sensitivity) 56 (*)    All other components within normal limits  RESP PANEL BY RT-PCR (FLU A&B, COVID) [id number]     EKG  ED ECG REPORT I, Rudene Re, the attending physician, personally viewed and interpreted this ECG.  Sinus rhythm with a rate of 70, first-degree AV block, incomplete left bundle branch block with Q waves in inferior and anterior leads, T wave inversions in lateral leads with no ST elevation.  Unchanged when compared to prior from [place], Rudene Re, attending MD, have personally viewed and interpreted the images obtained during this visit as below:  CXR with edema   ___________________________________________________ Interpretation by Radiologist:  DG Chest Portable 1 View  Result Date: 11/18/2021 CLINICAL DATA:  Shortness of breath and respiratory distress. EXAM: PORTABLE CHEST 1 VIEW COMPARISON:  None Available. FINDINGS: The heart size and mediastinal contours are within normal limits. There is marked severity calcification and tortuosity of the thoracic aorta. Mild, diffuse chronic appearing increased interstitial lung markings are seen. There is no evidence of focal consolidation, pleural effusion or pneumothorax. Prior vertebroplasty is noted within the midthoracic spine. A stable enchondroma is suspected within the proximal left humerus. IMPRESSION: Chronic appearing increased interstitial lung markings without focal  consolidation. A mild superimposed component of interstitial edema cannot be excluded. Electronically Signed   By: Virgina Norfolk M.D.   On: 11/18/2021 01:46       PROCEDURES:  Critical Care performed: Yes, see critical care procedure note(s)  .Critical Care Performed by: Rudene Re, MD Authorized by: Rudene Re, MD   Critical care provider statement:    Critical care time (minutes):  45   Critical care time was exclusive of:  Separately billable procedures and treating other patients   Critical care was necessary to treat or prevent imminent or life-threatening deterioration of the following conditions:  Cardiac failure, respiratory failure, circulatory failure, CNS failure or compromise, sepsis and shock  Critical care was time spent personally by me on the following activities:  Development of treatment plan with patient or surrogate, discussions with consultants, evaluation of patient's response to treatment, examination of patient, ordering and review of laboratory studies, ordering and review of radiographic studies, ordering and performing treatments and interventions, pulse oximetry, re-evaluation of patient's condition, review of old charts and obtaining history from patient or surrogate   I assumed direction of critical care for this patient from another provider in my specialty: no     Care discussed with: admitting provider      IMPRESSION / MDM / [place] / ED COURSE  I reviewed the triage vital signs and the nursing notes.   88 y.o. female with a history of CHF (EF 20%), diabetes, anemia, hypothyroidism, hypertension, hyperlipidemia who presents for evaluation of shortness of breath.  Patient is in moderate respiratory distress, fluid overloaded, extremely hypertensive with crackles and diminished air movement bilaterally.  Patient was placed on BiPAP immediately on arrival, started on nitro drip and given 40 mg of IV Lasix  Ddx: CHF  exacerbation versus flash pulmonary edema versus ACS versus pneumonia versus COVID versus flu versus PE versus myocarditis versus pericarditis versus pericardial effusion   Plan: BiPAP, diuresis, EKG, troponin x2, BNP, CBC, BMP, chest x-ray, COVID and flu swab.  Patient placed on telemetry for close monitoring of cardiorespiratory status.   MEDICATIONS GIVEN IN ED: Medications  nitroGLYCERIN 50 mg in dextrose 5 % 250 mL (0.2 mg/mL) infusion (40 mcg/min Intravenous New Bag/Given 11/18/21 0135)  furosemide (LASIX) injection 40 mg (40 mg Intravenous Given 11/18/21 0135)     ED COURSE: Work-up consistent with CHF exacerbation.  BNP of 1670, chest x-ray concerning for pneumonia.  No leukocytosis, no fever.  Troponin is mildly elevated most likely due to demand ischemia in the setting of hypoxia.  Unchanged metabolic panel.  COVID and flu negative.  Patient looks markedly improved on BiPAP.  Hospitalist service was consulted and after discussion accepted patient to their service   Consults: Hospitalist   EMR reviewed including last visit with her primary care doctor from February 2023 for CHF    FINAL CLINICAL IMPRESSION(S) / ED DIAGNOSES   Final diagnoses:  Acute respiratory failure with hypoxia ([place])  Acute on chronic congestive heart failure, unspecified heart failure type ([place])  Demand ischemia ([place])     Rx / DC Orders   ED Discharge Orders     None        Note:  This document was prepared using Dragon voice recognition software and may include unintentional dictation errors.   Please note:  Patient was evaluated in Emergency Department today for the symptoms described in the history of present illness. Patient was evaluated in the context of the global COVID-19 pandemic, which necessitated consideration that the patient might be at risk for infection with the SARS-CoV-2 virus that causes COVID-19. Institutional protocols and algorithms that pertain to the evaluation of  patients at risk for COVID-19 are in a state of rapid change based on information released by regulatory bodies including the CDC and federal and state organizations. These policies and algorithms were followed during the patient's care in the ED.  Some ED evaluations and interventions may be delayed as a result of limited staffing during the pandemic.       Alfred Levins, [place], MD 11/18/21 [id number]

## 2021-11-18 NOTE — Progress Notes (Signed)
[place] CRITICAL VALUE ALERT - BLOOD CULTURE IDENTIFICATION (BCID)  Jacqueline [name] is an 88 y.o. female who presented to [facility] on 11/18/2021 with a chief complaint of SOB  Assessment:  Bcx 1/4 GPR (BCID not done in these cases) , suspect contaminant  Name of physician (or Provider) Contacted: Dr. Benny Lennert  Current antibiotics: none  Changes to prescribed antibiotics recommended:  Recommendations accepted by provider -- continue to monitor off antibiotics. Repeat blood cultures if warranted   No results found for this or any previous visit.  [name], PharmD, BCPS Clinical Pharmacist   11/18/2021  8:00 PM

## 2021-11-18 NOTE — Progress Notes (Signed)
Pt taken off bipap and placed on 6L [place], sats 98%, respiratory rate 18/min. RN notified

## 2021-11-18 NOTE — Assessment & Plan Note (Signed)
Demand ischemia from high blood pressure and heart failure

## 2021-11-18 NOTE — ED Notes (Signed)
Nitro stopped by provider due to patient blood pressure improvement

## 2021-11-18 NOTE — H&P (Signed)
History and Physical    [name] UXL:[id number] DOB: 1932/07/25 DOA: 11/18/2021 DOS: the patient was seen and examined on 11/18/2021 PCP: Tracie Harrier, MD  Patient coming from: Home-lives alone  Chief Complaint:  Chief Complaint  Patient presents with   Shortness of Breath   HPI: Jacqueline [name] is a 88 y.o. female with medical history significant of congestive heart failure, diabetes mellitus type 2, dilated idiopathic cardiomyopathy, GERD, hyperlipidemia, hypertension, hypothyroidism, and renal disorder presents the ED with a chief complaint of shortness of breath.  Patient is on BiPAP and quite exhausted so history is taken from daughter.  Daughter reports that patient called her tonight and said that she felt like her oxygen tank was not working.  Daughter went over to check on her and her oxygen tank was working fine.  Daughter checked her pulse ox and it was reading 85-88%.  Daughter sat patient up in bed and then her sats improved to 90%.  Patient had been complaining about a tightness in the center of her chest.  She reports that it was improved when her oxygen sats were in the 90s.  Patient then had to be assisted with ambulation to the bathroom.  She made to the bathroom fine but on return she could not get back in bed.  She ended up falling face first on the bed, without injury, but could not push herself back up.  Daughter reports that patient became clammy, diaphoretic, erythema edematous and then her lips became cyanotic.  Daughter then had to flip patient back over as patient was not able to turn herself over.  Daughter checked oxygen saturations at that time they were in the 50s.  She called EMS and by the time EMS got there her oxygen sats were in the 27s.  Patient generally wears 4 L nasal cannula at home.  She has had progressive difficulty with ambulation due to her CHF.  They have an appointment with palliative care next week.  Daughter reports that patient although  always has dyspnea, has not had symptoms like these in the past couple of days.  She has not been complaining of any chest pain, and she has been able to ambulate with her walker.  Daughter does report the patient's been coughing for approximately 2 weeks sometimes dry sometimes productive of clear phlegm.  Her feet have been swelling as well.  When this happens the daughter usually gives her half a pill extra of her torsemide, and that makes the fluid go away.  Patient has been complaining of urinary frequency throughout the night, but has not had any other complaints.  Patient does have help with ADLs 2-3 times per week with a sitter.  Patient does not smoke, does not drink alcohol, does not use illicit drugs.  She is vaccinated for COVID.  Daughter reports that patient has had before, and would want to continue to be DNR. Review of Systems: As mentioned in the history of present illness. All other systems reviewed and are negative. Past Medical History:  Diagnosis Date   Anemia    CHF (congestive heart failure) (HCC)    Diabetes mellitus without complication ([place]) 53/66/4403   Currently no high BS.  Has come off meds.  But having episodes of low BS now.   Dilated idiopathic cardiomyopathy (HCC)    GERD (gastroesophageal reflux disease)    Hyperlipidemia    Hypertension    Hypothyroidism    Osteoporosis    Renal disorder  stage 3   Past Surgical History:  Procedure Laterality Date   BUNIONECTOMY     COLONOSCOPY     ESOPHAGOGASTRODUODENOSCOPY (EGD) WITH PROPOFOL N/A 10/15/2017   Procedure: ESOPHAGOGASTRODUODENOSCOPY (EGD) WITH PROPOFOL;  Surgeon: Lollie Sails, MD;  Location: [facility] ENDOSCOPY;  Service: Endoscopy;  Laterality: N/A;   KYPHOPLASTY N/A 06/12/2017   Procedure: [id number];  Surgeon: Hessie Knows, MD;  Location: [facility] ORS;  Service: Orthopedics;  Laterality: N/A;   TONSILLECTOMY     Social History:  reports that she quit smoking about 37 years ago. She has never used  smokeless tobacco. She reports that she does not drink alcohol and does not use drugs.  Allergies  Allergen Reactions   Ciprofloxacin    Digoxin And Related    Erythromycin    Lopid [Gemfibrozil]    Nitrofurantoin    Spironolactone Other (See Comments)    Hospitalized for dehydration   Cefuroxime Rash   Penicillins Rash    Has patient had a PCN reaction causing immediate rash, facial/tongue/throat swelling, SOB or lightheadedness with hypotension: Unknown Has patient had a PCN reaction causing severe rash involving mucus membranes or skin necrosis: No Has patient had a PCN reaction that required hospitalization: No Has patient had a PCN reaction occurring within the last 10 years: No If all of the above answers are "NO", then may proceed with Cephalosporin use.     Family History  Problem Relation Age of Onset   Premature CHD Brother     Prior to Admission medications   Medication Sig Start Date End Date Taking? Authorizing Provider  alendronate (FOSAMAX) 70 MG tablet Take 70 mg by mouth once a week. Take with a full glass of water on an empty stomach.   Yes [provider]  aspirin EC 81 MG tablet Take 81 mg by mouth daily.   Yes [provider]  carvedilol (COREG) 12.5 MG tablet Take by mouth 2 (two) times daily with a meal.    Yes [provider]  ergocalciferol (VITAMIN D2) 50000 units capsule Take 50,000 Units by mouth once a week.   Yes [provider]  iron polysaccharides (NIFEREX) 150 MG capsule Take 150 mg by mouth daily.   Yes [provider]  isosorbide mononitrate (IMDUR) 60 MG 24 hr tablet Take 0.5 tablets (30 mg total) by mouth daily. 06/05/17  Yes Sainani, Belia Heman, MD  KLOR-CON M10 10 MEQ tablet Take 10 mEq by mouth daily. 07/23/17  Yes [provider]  levothyroxine (SYNTHROID, LEVOTHROID) 25 MCG tablet Take 25 mcg by mouth daily before breakfast.   Yes [provider]  pantoprazole (PROTONIX) 40 MG  tablet Take 40 mg by mouth daily.   Yes [provider]  sertraline (ZOLOFT) 50 MG tablet Take 50 mg by mouth daily.   Yes [provider]  torsemide (DEMADEX) 10 MG tablet Take 1 tablet (10 mg total) by mouth daily. 01/08/19  Yes Epifanio Lesches, MD  glucose blood test strip USE TO CHECK BLOOD SUGARS TWICE A DAY 02/19/16   [provider]  JARDIANCE 10 MG TABS tablet Take 10 mg by mouth daily. 09/23/21   [provider]    Physical Exam: Vitals:   11/18/21 0300 11/18/21 0315 11/18/21 0330 11/18/21 0345  BP: 117/62 113/61 (!) 109/57 (!) 117/58  Pulse: 64 (!) 35 63 64  Resp: 20 20 (!) 22 (!) 22  Temp: 97.9 F (36.6 C)     TempSrc: Axillary     SpO2: 97%  95% 96% 97%   1.  General: Patient lying supine in bed,  no acute distress   2. Psychiatric: Alert and oriented x 3, mood and behavior normal for situation, pleasant and cooperative with exam   3. Neurologic: Speech and language are normal, face is symmetric, moves all 4 extremities voluntarily, at baseline without acute deficits on limited exam   4. HEENMT:  Head is atraumatic, normocephalic, pupils reactive to light, neck is supple, trachea is midline, mucous membranes are moist   5. Respiratory : Fine crackles in the bilateral lower lung fields, BiPAP in place, no wheezing, no rhonchi, no increased work of breathing while on BiPAP   6. Cardiovascular : Heart rate normal, rhythm is regular, no murmurs, rubs or gallops, peripheral edema more in the right than left, peripheral pulses palpated   7. Gastrointestinal:  Abdomen is soft, nondistended, nontender to palpation bowel sounds active, no masses or organomegaly palpated   8. Skin:  Skin is warm, dry and intact without rashes, acute lesions, or ulcers on limited exam   9.Musculoskeletal:  No acute deformities or trauma, no asymmetry in tone, peripheral edema greater on right than left, peripheral pulses palpated, minimal tenderness to  palpation of the entire left leg including the posterior calf and popliteal fossa   Data Reviewed: In the ED Patient is afebrile, heart rate 67, respiratory rate 24, blood pressure 144/68, satting at 100% on BiPAP No leukocytosis with a white blood cell count of 4.8, hemoglobin 16.7, platelets 101 Chemistry reveals a creatinine that is very near baseline at 1.15, glucose 158 BNP 1670 Troponin 56 Negative COVID and flu Chest x-ray showed chronic appearing increased interstitial lung markings without focal consolidation.  Likely a mild superimposed component of interstitial edema Lasix 40 mg, nitro drip, BiPAP started in the ED Admission requested for CHF exacerbation  Assessment and Plan: * CHF exacerbation (HCC) Acute congestive heart failure exacerbation Major Criteria orthopnea, cardiomegaly, pulmonary edema on chest x-ray   Minor criteria bilateral leg edema, dyspnea on exertion  IV diuresis initiated with 40 mg IV Lasix, continue with 40 mg IV Lasix daily patient is on torsemide 10 mg at baseline  last Echo was in July 2021 showed an ejection fraction of 67-12% with diastolic dysfunction, prior to that she had an echo in 2018 that showed an LV EF of 30-35% Update echo Daily weights, fluid restriction, strict Is and Os Continue aspirin, Coreg, Imdur Home medications Continue BiPAP Continue nitro drip Monitor on stepdown   Hypertensive emergency See plan for hypertension  Elevated troponin - Initial troponin 56 -Repeat pending -Likely secondary to demand given hypertensive emergency and CHF exacerbation -EKG shows sinus rhythm, heart rate 70, right bundle, no acute ST changes -Monitor on telemetry  Hypothyroidism - Continue Synthroid -Check TSH -Continue to monitor  HTN (hypertension) - Continue Coreg, Imdur -Patient was quite hypertensive at presentation with a systolic in the 458K -hypertensive emergency given CHF exacerbation and elevated troponin 56 -Blood  pressure is corrected with nitro drip and Lasix -Continue nitro drip and Lasix -Continue to monitor  Diabetes (HCC) - Holding Jardiance -Sliding scale coverage -Last hemoglobin A1c was 6.0 -Update hemoglobin A1c -Continue to monitor      Advance Care Planning:   Code Status: DNR   Consults: None  Family Communication: Daughter at bedside  Severity of Illness: The appropriate patient status for this patient is INPATIENT. Inpatient status is judged to be reasonable and necessary in order to provide the required intensity of service  to ensure the patient's safety. The patient's presenting symptoms, physical exam findings, and initial radiographic and laboratory data in the context of their chronic comorbidities is felt to place them at high risk for further clinical deterioration. Furthermore, it is not anticipated that the patient will be medically stable for discharge from the hospital within 2 midnights of admission.   * I certify that at the point of admission it is my clinical judgment that the patient will require inpatient hospital care spanning beyond 2 midnights from the point of admission due to high intensity of service, high risk for further deterioration and high frequency of surveillance required.*  Author: Rolla Plate, DO 11/18/2021 4:37 AM  For on call review [email or web address].

## 2021-11-18 NOTE — Assessment & Plan Note (Signed)
Continue Synthroid °

## 2021-11-18 NOTE — Assessment & Plan Note (Signed)
See plan for hypertension

## 2021-11-18 NOTE — Assessment & Plan Note (Addendum)
Continue Jardiance

## 2021-11-18 NOTE — ED Triage Notes (Signed)
Patient brought in via medic from home after daughter came over and checked oxygen at 59%, fire dept checked at 60s.

## 2021-11-18 NOTE — Assessment & Plan Note (Addendum)
Continue Coreg, Imdur, Lasix and Entresto

## 2021-11-18 NOTE — ED Notes (Signed)
ED TO INPATIENT HANDOFF REPORT  ED Nurse Name and Phone #: Baxter Flattery, RN  S Name/Age/Gender Jacqueline [name] 88 y.o. female Room/Bed: ED07A/ED07A  Code Status   Code Status: DNR  Home/SNF/Other Home Patient oriented to: self, place, time, and situation Is this baseline? Yes   Triage Complete: Triage complete  Chief Complaint CHF exacerbation ([place]) [I50.9] Acute exacerbation of CHF (congestive heart failure) ([place]) [I50.9]  Triage Note Patient brought in via medic from home after daughter came over and checked oxygen at 59%, fire dept checked at 93s.    Allergies Allergies  Allergen Reactions   Ciprofloxacin    Digoxin And Related    Erythromycin    Lopid [Gemfibrozil]    Nitrofurantoin    Spironolactone Other (See Comments)    Hospitalized for dehydration   Cefuroxime Rash   Penicillins Rash    Has patient had a PCN reaction causing immediate rash, facial/tongue/throat swelling, SOB or lightheadedness with hypotension: Unknown Has patient had a PCN reaction causing severe rash involving mucus membranes or skin necrosis: No Has patient had a PCN reaction that required hospitalization: No Has patient had a PCN reaction occurring within the last 10 years: No If all of the above answers are "NO", then may proceed with Cephalosporin use.     Level of Care/Admitting Diagnosis ED Disposition     ED Disposition  Admit   Condition  --   [place]: [place] [[id number]]  Level of Care: Progressive [102]  Admit to Progressive based on following criteria: CARDIOVASCULAR & THORACIC of moderate stability with acute coronary syndrome symptoms/low risk myocardial infarction/hypertensive urgency/arrhythmias/heart failure potentially compromising stability and stable post cardiovascular intervention patients.  Admit to Progressive based on following criteria: RESPIRATORY PROBLEMS hypoxemic/hypercapnic respiratory failure that is responsive to NIPPV  (BiPAP) or High Flow Nasal Cannula (6-80 lpm). Frequent assessment/intervention, no > Q2 hrs < Q4 hrs, to maintain oxygenation and pulmonary hygiene.  Covid Evaluation: Asymptomatic - no recent exposure (last 10 days) testing not required  Diagnosis: Acute exacerbation of CHF (congestive heart failure) [facility]) [[id number]]  Admitting Physician: Monna Fam  Attending Physician: Benny Lennert, AVA [phone]  Estimated length of stay: past midnight tomorrow  Certification:: I certify this patient will need inpatient services for at least 2 midnights          B Medical/Surgery History Past Medical History:  Diagnosis Date   Anemia    CHF (congestive heart failure) ([place])    Diabetes mellitus without complication ([place]) 22/07/5425   Currently no high BS.  Has come off meds.  But having episodes of low BS now.   Dilated idiopathic cardiomyopathy (HCC)    GERD (gastroesophageal reflux disease)    Hyperlipidemia    Hypertension    Hypothyroidism    Osteoporosis    Renal disorder    stage 3   Past Surgical History:  Procedure Laterality Date   BUNIONECTOMY     COLONOSCOPY     ESOPHAGOGASTRODUODENOSCOPY (EGD) WITH PROPOFOL N/A 10/15/2017   Procedure: ESOPHAGOGASTRODUODENOSCOPY (EGD) WITH PROPOFOL;  Surgeon: Lollie Sails, MD;  Location: [facility];  Service: Endoscopy;  Laterality: N/A;   KYPHOPLASTY N/A 06/12/2017   Procedure: [id number];  Surgeon: Hessie Knows, MD;  Location: [facility];  Service: Orthopedics;  Laterality: N/A;   TONSILLECTOMY       A IV Location/Drains/Wounds Patient Lines/Drains/Airways Status     Active Line/Drains/Airways     Name Placement date Placement time Site Days   Peripheral IV 11/18/21  Posterior;Right Hand 11/18/21  0126  Hand  less than 1   Peripheral IV 11/18/21 18 G Proximal;Right;Posterior Forearm 11/18/21  0130  Forearm  less than 1   Peripheral IV 11/18/21 20 G Anterior;Right Forearm 11/18/21  0142  Forearm  less than 1   External Urinary  Catheter 11/18/21  1307  --  less than 1            Intake/Output Last 24 hours  Intake/Output Summary (Last 24 hours) at 11/18/2021 1647 Last data filed at 11/18/2021 0800 Gross per 24 hour  Intake 41.16 ml  Output --  Net 41.16 ml    Labs/Imaging Results for orders placed or performed during the hospital encounter of 11/18/21 (from the past 48 hour(s))  CBC with Differential     Status: Abnormal   Collection Time: 11/18/21  1:36 AM  Result Value Ref Range   WBC 4.8 4.0 - 10.5 K/uL   RBC 5.76 (H) 3.87 - 5.11 MIL/uL   Hemoglobin 16.7 (H) 12.0 - 15.0 g/dL   HCT 53.6 (H) 36.0 - 46.0 %   MCV 93.1 80.0 - 100.0 fL   MCH 29.0 26.0 - 34.0 pg   MCHC 31.2 30.0 - 36.0 g/dL   RDW 14.5 11.5 - 15.5 %   Platelets 101 (L) 150 - 400 K/uL   nRBC 0.0 0.0 - 0.2 %   Neutrophils Relative % 50 %   Neutro Abs 2.4 1.7 - 7.7 K/uL   Lymphocytes Relative 40 %   Lymphs Abs 1.9 0.7 - 4.0 K/uL   Monocytes Relative 7 %   Monocytes Absolute 0.3 0.1 - 1.0 K/uL   Eosinophils Relative 3 %   Eosinophils Absolute 0.1 0.0 - 0.5 K/uL   Basophils Relative 0 %   Basophils Absolute 0.0 0.0 - 0.1 K/uL   Immature Granulocytes 0 %   Abs Immature Granulocytes 0.02 0.00 - 0.07 K/uL    Comment: Performed at [facility], [place], [place] [address]  Troponin I (High Sensitivity)     Status: Abnormal   Collection Time: 11/18/21  1:36 AM  Result Value Ref Range   Troponin I (High Sensitivity) 56 (H) <18 ng/L    Comment: (NOTE) Elevated high sensitivity troponin I (hsTnI) values and significant  changes across serial measurements may suggest ACS but many other  chronic and acute conditions are known to elevate hsTnI results.  Refer to the "Links" section for chest pain algorithms and additional  guidance. Performed at [facility], [address] [place], [place] [address]   Brain natriuretic peptide     Status: Abnormal   Collection Time: 11/18/21  1:36 AM  Result Value Ref  Range   B Natriuretic Peptide 1,670.7 (H) 0.0 - 100.0 pg/mL    Comment: Performed at [facility], [address] [place], [place] [address]  Basic metabolic panel     Status: Abnormal   Collection Time: 11/18/21  1:36 AM  Result Value Ref Range   Sodium 140 135 - 145 mmol/L   Potassium 3.7 3.5 - 5.1 mmol/L   Chloride 105 98 - 111 mmol/L   CO2 30 22 - 32 mmol/L   Glucose, Bld 158 (H) 70 - 99 mg/dL    Comment: Glucose reference range applies only to samples taken after fasting for at least 8 hours.   BUN 31 (H) 8 - 23 mg/dL   Creatinine, Ser 1.15 (H) 0.44 - 1.00 mg/dL   Calcium 8.5 (L) 8.9 -  10.3 mg/dL   GFR, Estimated 46 (L) >60 mL/min    Comment: (NOTE) Calculated using the CKD-EPI Creatinine Equation (2021)    Anion gap 5 5 - 15    Comment: Performed at [facility], [address] [place], [place] [address]  Resp Panel by RT-PCR (Flu A&B, Covid) Anterior Nasal Swab     Status: None   Collection Time: 11/18/21  1:36 AM   Specimen: Anterior Nasal Swab  Result Value Ref Range   SARS Coronavirus 2 by RT PCR NEGATIVE NEGATIVE    Comment: (NOTE) SARS-CoV-2 target nucleic acids are NOT DETECTED.  The SARS-CoV-2 RNA is generally detectable in upper respiratory specimens during the acute phase of infection. The lowest concentration of SARS-CoV-2 viral copies this assay can detect is 138 copies/mL. A negative result does not preclude SARS-Cov-2 infection and should not be used as the sole basis for treatment or other patient management decisions. A negative result may occur with  improper specimen collection/handling, submission of specimen other than nasopharyngeal swab, presence of viral mutation(s) within the areas targeted by this assay, and inadequate number of viral copies(<138 copies/mL). A negative result must be combined with clinical observations, patient history, and epidemiological information. The expected result is Negative.  Fact Sheet for  Patients:  EntrepreneurPulse.com.au  Fact Sheet for Healthcare Providers:  IncredibleEmployment.be  This test is no t yet approved or cleared by the [place] FDA and  has been authorized for detection and/or diagnosis of SARS-CoV-2 by FDA under an Emergency Use Authorization (EUA). This EUA will remain  in effect (meaning this test can be used) for the duration of the COVID-19 declaration under Section 564(b)(1) of the Act, 21 U.S.C.section 360bbb-3(b)(1), unless the authorization is terminated  or revoked sooner.       Influenza A by PCR NEGATIVE NEGATIVE   Influenza B by PCR NEGATIVE NEGATIVE    Comment: (NOTE) The Xpert Xpress SARS-CoV-2/FLU/RSV plus assay is intended as an aid in the diagnosis of influenza from Nasopharyngeal swab specimens and should not be used as a sole basis for treatment. Nasal washings and aspirates are unacceptable for Xpert Xpress SARS-CoV-2/FLU/RSV testing.  Fact Sheet for Patients: EntrepreneurPulse.com.au  Fact Sheet for Healthcare Providers: IncredibleEmployment.be  This test is not yet approved or cleared by the [place] FDA and has been authorized for detection and/or diagnosis of SARS-CoV-2 by FDA under an Emergency Use Authorization (EUA). This EUA will remain in effect (meaning this test can be used) for the duration of the COVID-19 declaration under Section 564(b)(1) of the Act, 21 U.S.C. section 360bbb-3(b)(1), unless the authorization is terminated or revoked.  Performed at [facility], [address] [place], [place] [address]   Blood culture (routine x 2)     Status: None (Preliminary result)   Collection Time: 11/18/21  1:37 AM   Specimen: BLOOD  Result Value Ref Range   Specimen Description BLOOD RIGHT FA    Special Requests      BOTTLES DRAWN AEROBIC AND ANAEROBIC Blood Culture results may not be optimal due to an inadequate volume  of blood received in culture bottles   Culture      NO GROWTH < 12 HOURS Performed at [facility], [address] [place], [place] [address]    Report Status PENDING   Blood culture (routine x 2)     Status: None (Preliminary result)   Collection Time: 11/18/21  1:38 AM   Specimen: BLOOD  Result Value Ref Range  Specimen Description BLOOD RIGHT ARM    Special Requests      BOTTLES DRAWN AEROBIC AND ANAEROBIC Blood Culture adequate volume   Culture      NO GROWTH < 12 HOURS Performed at [facility], [address] [place], [place] [address]    Report Status PENDING   Troponin I (High Sensitivity)     Status: Abnormal   Collection Time: 11/18/21  7:58 AM  Result Value Ref Range   Troponin I (High Sensitivity) 57 (H) <18 ng/L    Comment: (NOTE) Elevated high sensitivity troponin I (hsTnI) values and significant  changes across serial measurements may suggest ACS but many other  chronic and acute conditions are known to elevate hsTnI results.  Refer to the "Links" section for chest pain algorithms and additional  guidance. Performed at [facility], [address] [place], [place] [address]    DG Chest Portable 1 View  Result Date: 11/18/2021 CLINICAL DATA:  Shortness of breath and respiratory distress. EXAM: PORTABLE CHEST 1 VIEW COMPARISON:  None Available. FINDINGS: The heart size and mediastinal contours are within normal limits. There is marked severity calcification and tortuosity of the thoracic aorta. Mild, diffuse chronic appearing increased interstitial lung markings are seen. There is no evidence of focal consolidation, pleural effusion or pneumothorax. Prior vertebroplasty is noted within the midthoracic spine. A stable enchondroma is suspected within the proximal left humerus. IMPRESSION: Chronic appearing increased interstitial lung markings without focal consolidation. A mild superimposed component of interstitial edema cannot be  excluded. Electronically Signed   By: Virgina Norfolk M.D.   On: 11/18/2021 01:46    Pending Labs Unresulted Labs (From admission, onward)     Start     Ordered   11/18/21 1459  Hemoglobin A1c  Tomorrow morning,   R       Comments: To assess prior glycemic control    11/18/21 1458   11/18/21 1459  Comprehensive metabolic panel  Tomorrow morning,   R        11/18/21 1458   11/18/21 1459  Magnesium  Tomorrow morning,   R        11/18/21 1458   11/18/21 1459  CBC with Differential/Platelet  Tomorrow morning,   R        11/18/21 1458   11/18/21 1459  TSH  Tomorrow morning,   R        11/18/21 1458   11/18/21 0500  Hemoglobin A1c  Tomorrow morning,   R        11/18/21 0432            Vitals/Pain Today's Vitals   11/18/21 1500 11/18/21 1530 11/18/21 1600 11/18/21 1630  BP: (!) 160/72 (!) 166/74 (!) 149/93   Pulse:  79 75   Resp: (!) 24 (!) 26 (!) 23   Temp:      TempSrc:      SpO2: 92% 96% 92% 97%    Isolation Precautions No active isolations  Medications Medications  nitroGLYCERIN 50 mg in dextrose 5 % 250 mL (0.2 mg/mL) infusion (0 mcg/min Intravenous Stopped 11/18/21 0503)  aspirin EC tablet 81 mg (81 mg Oral Given 11/18/21 1506)  carvedilol (COREG) tablet 12.5 mg (has no administration in time range)  isosorbide mononitrate (IMDUR) 24 hr tablet 30 mg (30 mg Oral Given 11/18/21 1506)  sertraline (ZOLOFT) tablet 50 mg (50 mg Oral Given 11/18/21 1507)  levothyroxine (SYNTHROID) tablet 25 mcg (has no administration in time range)  pantoprazole (PROTONIX)  EC tablet 40 mg (40 mg Oral Given 11/18/21 1507)  insulin aspart (novoLOG) injection 0-15 Units (has no administration in time range)  insulin aspart (novoLOG) injection 0-5 Units (has no administration in time range)  furosemide (LASIX) injection 40 mg (40 mg Intravenous Given 11/18/21 1509)  heparin injection 5,000 Units (5,000 Units Subcutaneous Given 11/18/21 1511)  acetaminophen (TYLENOL) tablet 650 mg (has no administration  in time range)    Or  acetaminophen (TYLENOL) suppository 650 mg (has no administration in time range)  oxyCODONE (Oxy IR/ROXICODONE) immediate release tablet 5 mg (has no administration in time range)  ondansetron (ZOFRAN) tablet 4 mg (has no administration in time range)    Or  ondansetron (ZOFRAN) injection 4 mg (has no administration in time range)  furosemide (LASIX) injection 40 mg (40 mg Intravenous Given 11/18/21 0135)    Mobility walks Moderate fall risk   Focused Assessments Pulmonary Assessment Handoff:  Lung sounds:   O2 Device: Nasal Cannula O2 Flow Rate (L/min): 4 L/min    R Recommendations: See Admitting Provider Note  Report given to:   Additional Notes:

## 2021-11-18 NOTE — Assessment & Plan Note (Deleted)
1. Acute congestive heart failure exacerbation 1. Major Criteria orthopnea, cardiomegaly, pulmonary edema on chest x-ray   2. Minor criteria bilateral leg edema, dyspnea on exertion  3. IV diuresis initiated with 40 mg IV Lasix, continue with 40 mg IV Lasix daily patient is on torsemide 10 mg at baseline  4. last Echo was in July 2021 showed an ejection fraction of 90-90% with diastolic dysfunction, prior to that she had an echo in 2018 that showed an LV EF of 30-35% 5. Update echo 6. Daily weights, fluid restriction, strict Is and Os 7. Continue aspirin, Coreg, Imdur Home medications 8. Continue BiPAP 9. Continue nitro drip 10. Monitor on stepdown

## 2021-11-18 NOTE — Progress Notes (Signed)
The patient is a 88 yr old woman who presented to [facility] ED on 11/17/2021 with complaints of shortness of breath. She is on 4L at home chronically. She was found to be hypoxic on her 4L with SaO2 of 70's. She had also been very weak, unable to even turn over in bed. She has been coughing for 2 weeks. She has had increasing peripheral edema for the same timeframe.   The patient carries a past medical history significant for CHF, DM II, Dilated ideopathic cardiomyopathy, GERD, hyperlipidemia, hypertension, hypothyroidism and CKD.   In the ED the patient is requiring Bipap to maintain saturations in the 90's. Blood pressures remain very high. BNP was high at 1670. EKG demonstrates an incomplete left fascicular block and 1st degree heart block. Respiratory panel is negative for flu a and flu b, and SARS. CXR demonstrated pulmonary edema. Troponin is stable.   Echocardiogram has been performed, but report is pending.   The patient was admitted by my colleague Dr. Clearence Ped early this morning.   I have seen and examined this patient myself. I have reviewed her H&P, labwork, imagery, and vitals.  She is being admitted to a progressive bed on telemetry.

## 2021-11-19 DIAGNOSIS — I5023 Acute on chronic systolic (congestive) heart failure: Secondary | ICD-10-CM | POA: Diagnosis not present

## 2021-11-19 DIAGNOSIS — E119 Type 2 diabetes mellitus without complications: Secondary | ICD-10-CM | POA: Diagnosis not present

## 2021-11-19 DIAGNOSIS — R778 Other specified abnormalities of plasma proteins: Secondary | ICD-10-CM | POA: Diagnosis not present

## 2021-11-19 DIAGNOSIS — I5043 Acute on chronic combined systolic (congestive) and diastolic (congestive) heart failure: Secondary | ICD-10-CM | POA: Diagnosis not present

## 2021-11-19 LAB — GLUCOSE, CAPILLARY
Glucose-Capillary: 187 mg/dL — ABNORMAL HIGH (ref 70–99)
Glucose-Capillary: 178 mg/dL — ABNORMAL HIGH (ref 70–99)
Glucose-Capillary: 129 mg/dL — ABNORMAL HIGH (ref 70–99)
Glucose-Capillary: 135 mg/dL — ABNORMAL HIGH (ref 70–99)

## 2021-11-19 LAB — CBC
HCT: 52.7 % — ABNORMAL HIGH (ref 36.0–46.0)
Hemoglobin: 16.8 g/dL — ABNORMAL HIGH (ref 12.0–15.0)
MCH: 29.4 pg (ref 26.0–34.0)
MCHC: 31.9 g/dL (ref 30.0–36.0)
MCV: 92.1 fL (ref 80.0–100.0)
Platelets: 120 10*3/uL — ABNORMAL LOW (ref 150–400)
RBC: 5.72 MIL/uL — ABNORMAL HIGH (ref 3.87–5.11)
RDW: 14.4 % (ref 11.5–15.5)
WBC: 9.2 10*3/uL (ref 4.0–10.5)
nRBC: 0 % (ref 0.0–0.2)

## 2021-11-19 LAB — ECHOCARDIOGRAM COMPLETE
AR max vel: 1.23 cm2
AV Area VTI: 1.61 cm2
AV Area mean vel: 1.24 cm2
AV Mean grad: 3 mmHg
AV Peak grad: 5.7 mmHg
Ao pk vel: 1.19 m/s
S' Lateral: 3.25 cm

## 2021-11-19 LAB — BASIC METABOLIC PANEL
Anion gap: 7 (ref 5–15)
BUN: 26 mg/dL — ABNORMAL HIGH (ref 8–23)
CO2: 33 mmol/L — ABNORMAL HIGH (ref 22–32)
Calcium: 8.6 mg/dL — ABNORMAL LOW (ref 8.9–10.3)
Chloride: 102 mmol/L (ref 98–111)
Creatinine, Ser: 1.09 mg/dL — ABNORMAL HIGH (ref 0.44–1.00)
GFR, Estimated: 49 mL/min — ABNORMAL LOW (ref >60)
Glucose, Bld: 115 mg/dL — ABNORMAL HIGH (ref 70–99)
Potassium: 3.7 mmol/L (ref 3.5–5.1)
Sodium: 142 mmol/L (ref 135–145)

## 2021-11-19 LAB — HEMOGLOBIN A1C
Hgb A1c MFr Bld: 6.5 % — ABNORMAL HIGH (ref 4.8–5.6)
Mean Plasma Glucose: 139.85 mg/dL

## 2021-11-19 LAB — MAGNESIUM: Magnesium: 2.5 mg/dL — ABNORMAL HIGH (ref 1.7–2.4)

## 2021-11-19 NOTE — Consult Note (Signed)
   Heart Failure Nurse Navigator Note  HFpEF 55 to 60%.  Mildly thickened LV by echocardiogram performed January 05, 2019.  Echocardiogram results is pending on this admission.  She presented to the emergency room with complaints of worsening shortness of breath with initial  O2 saturations in the 50s.  Comorbidities:  Chronic kidney disease stage III Osteoporosis Hypothyroidism Hypertension Hyperlipidemia GERD Diabetes Anemia  Medications:  Aspirin 81 mg daily Carvedilol 12-1/2 mg 2 times a day with meals Furosemide 40 mg IV daily Isosorbide mononitrate 30 mg daily Levothyroxine 25 mcg before breakfast  Labs:  BNP on admission 1670.  Sodium 142, potassium 3.7, chloride 102, CO2 33, BUN 26, creatinine 1.09, GFR 49, magnesium 2.5, hemoglobin 16.8, hematocrit 52.7. Weight is 58.3 kg Blood pressure 130/54 Intake 412 mL Output not documented  Initial meeting with patient and daughter who was at the bedside.  Patient lives in her own home, daughter checks on her frequently throughout the day and then patient is alone at night.  She tries to weigh herself daily.  Discussed reporting 3 pound weight gain overnight or total of 5 pounds within the week or any changes in symptoms.  Daughter states at this time if she notices increased swelling in her mom's legs that she administers an extra half tablet of the torsemide.  Discussed fluid restriction of 64 ounces daily, daughter states in the past that this is what they have been told.  Also that she follows with a low-sodium diet and does not add salt at the table.  Discussed follow-up in the outpatient heart failure clinic for which she has an appointment on June 13 at 4 PM.  She has a 6% no-show of 2 out of 35 appointments.  Daughter states that they are scheduled to have an appointment with palliative this Thursday which is June 8.  She goes on to state that transportation of her mother to appointments is very difficult with the  oxygen.  They were given the living with heart failure teaching booklet, zone magnet, it fell on low-sodium and heart failure along with a weight chart.  They had no further questions.  Pricilla Riffle RN [facility]

## 2021-11-19 NOTE — Progress Notes (Signed)
PROGRESS NOTE  EVERETTE DIMAURO ZOX:[id number] DOB: 05-19-1933 DOA: 11/18/2021 PCP: Tracie Harrier, MD  Brief History   The patient is a 88 yr old woman who presented to [facility] ED on 11/17/2021 with complaints of shortness of breath. She is on 4L at home chronically. She was found to be hypoxic on her 4L with SaO2 of 70's. She had also been very weak, unable to even turn over in bed. She has been coughing for 2 weeks. She has had increasing peripheral edema for the same timeframe.    The patient carries a past medical history significant for CHF, DM II, Dilated ideopathic cardiomyopathy, GERD, hyperlipidemia, hypertension, hypothyroidism and CKD.    In the ED the patient is requiring Bipap to maintain saturations in the 90's. Blood pressures remain very high. BNP was high at 1670. EKG demonstrates an incomplete left fascicular block and 1st degree heart block. Respiratory panel is negative for flu a and flu b, and SARS. CXR demonstrated pulmonary edema. Troponin is stable.    Echocardiogram has been performed. It demonstrates an EF of 20-25% with severely decreased function and global hypokinesis. There is grade II diastolic dysfunction. RV size and function is normal.  Cardiology has been consulted.  Consultants  Cardiology  Procedures  None  Antibiotics   Anti-infectives (From admission, onward)    None      Subjective  The patient is resting comfortably. No new complaints. She states that she is feeling better.   Objective   Vitals:  Vitals:   11/19/21 1127 11/19/21 1612  BP: (!) 130/54 (!) 122/59  Pulse: 66 64  Resp: 16 18  Temp: (!) 97.4 F (36.3 C) 98.7 F (37.1 C)  SpO2: 98% 95%    Exam:  Constitutional:  The patient is awake, alert, and oriented x 3. No acute distress. Respiratory:  No increased work of breathing. No wheezes or rhonchi Rales at bases bilaterally. No tactile fremitus Cardiovascular:  Regular rate and rhythm No murmurs, ectopy, or gallups. No  lateral PMI. No thrills. Abdomen:  Abdomen is soft, non-tender, non-distended No hernias, masses, or organomegaly Normoactive bowel sounds.  Musculoskeletal:  No cyanosis, clubbing, or edema Skin:  No rashes, lesions, ulcers palpation of skin: no induration or nodules Neurologic:  CN 2-12 intact Sensation all 4 extremities intact Psychiatric:  Mental status Mood, affect appropriate Orientation to person, place, time  judgment and insight appear intact   I have personally reviewed the following:   Today's Data  Vitals  Lab Data  BMP, CBC  Micro Data  Blood culture x 2: No growth  Imaging  CXR  Cardiology Data  EKG Echocardiogam  Other Data    Scheduled Meds:  aspirin EC  81 mg Oral Daily   carvedilol  12.5 mg Oral BID WC   furosemide  40 mg Intravenous Daily   heparin  5,000 Units Subcutaneous Q8H   insulin aspart  0-15 Units Subcutaneous TID WC   insulin aspart  0-5 Units Subcutaneous QHS   isosorbide mononitrate  30 mg Oral Daily   levothyroxine  25 mcg Oral QAC breakfast   pantoprazole  40 mg Oral Daily   sertraline  50 mg Oral Daily   Continuous Infusions:  nitroGLYCERIN Stopped (11/18/21 0501)    Principal Problem:   CHF exacerbation (HCC) Active Problems:   Diabetes ([place])   HTN (hypertension)   GERD (gastroesophageal reflux disease)   Hypothyroidism   Elevated troponin   Hypertensive emergency   Acute exacerbation of CHF (congestive  heart failure) ([place])   LOS: 1 day   A & P  Acute congestive heart failure exacerbation with cardiomegaly, orthopenia, pulmonary edema, leg edema, and dyspnea with exertion.   IV diuresis initiated with 40 mg IV Lasix, continue with 40 mg IV Lasix daily patient is on torsemide 10 mg at baseline  last Echo was in July 2021 showed an ejection fraction of 87-56% with diastolic dysfunction, prior to that she had an echo in 2018 that showed an LV EF of 30-35% Echo performed today shows a severe drop in ejection  fraction. Cardiology has been consulted. Daily weights, fluid restriction, strict Is and Os Continue aspirin, Coreg, Imdur Home medications Continue BiPAP and nitro drip.  6.   Monitor on stepdown   Hypertensive emergency See plan for hypertension   Elevated troponin - Initial troponin 56 -Repeat pending -Likely secondary to demand given hypertensive emergency and CHF exacerbation -EKG shows sinus rhythm, heart rate 70, right bundle, no acute ST changes -Monitor on telemetry   Hypothyroidism - Continue Synthroid -Check TSH -Continue to monitor   HTN (hypertension) - Continue Coreg, Imdur -Patient was quite hypertensive at presentation with a systolic in the 433I -hypertensive emergency given CHF exacerbation and elevated troponin 56 -Blood pressure is corrected with nitro drip and Lasix -Continue nitro drip and Lasix -Continue to monitor   Diabetes ([place]) - Holding Jardiance -Sliding scale coverage -Last hemoglobin A1c was 6.0 -Update hemoglobin A1c -Continue to monitor   I have seen and examined this patient myself. I have spent 36 minutes in her evaluation and care.  DVT prophylaxis: SCD's Code Status: DNR Family Communication: Daughter is at bedside Disposition Plan: tbd    Amayah Staheli, DO [facility] Direct contact: see [email or web address]  7PM-7AM contact night coverage as above 11/19/2021, 5:15 PM  LOS: 1 day

## 2021-11-19 NOTE — Consult Note (Signed)
[facility] Consultation Note  Patient ID: Jacqueline [name], MRN: [id number], DOB/AGE: 1932-11-04 88 y.o. Admit date: 11/18/2021   Date of Consult: 11/19/2021 Primary Physician: Tracie Harrier, MD Primary Cardiologist: Nehemiah Massed  Chief Complaint:  Chief Complaint  Patient presents with   Shortness of Breath   Reason for Consult:  Heart failure  HPI: 88 y.o. female with known chronic systolic dysfunction congestive heart failure with mild mitral and tricuspid regurgitation and severe LV systolic dysfunction by previous echocardiogram of 20%.  The patient has had this for many many years with chronic kidney disease stage III with glomerular filtration rate of 49.  The patient also has diabetes and an abnormal EKG showing normal sinus rhythm left atrial enlargement left axis deviation and left bundle branch block.  She does have chronic oxygen requirement of 4 L.  She was seen by her daughter to have severe shortness of breath and an oxygenation of 59%.  At that time the patient had some increased lower extremity edema and shortness of breath and blueness.  She was taken via EMS to the emergency room for which she received more oxygen and intravenous Lasix with some reasonable urine output.  At this time she has improvements of oxygenation and no evidence of acute coronary syndrome.  Troponin level is 57.  Chest x-ray shows pulmonary edema especially in the lower lung fields.  At this point she does feel slightly better  Past Medical History:  Diagnosis Date   Anemia    CHF (congestive heart failure) (HCC)    Diabetes mellitus without complication (August) 17/40/8144   Currently no high BS.  Has come off meds.  But having episodes of low BS now.   Dilated idiopathic cardiomyopathy (HCC)    GERD (gastroesophageal reflux disease)    Hyperlipidemia    Hypertension    Hypothyroidism    Osteoporosis    Renal disorder    stage 3      Surgical History:  Past Surgical History:   Procedure Laterality Date   BUNIONECTOMY     COLONOSCOPY     ESOPHAGOGASTRODUODENOSCOPY (EGD) WITH PROPOFOL N/A 10/15/2017   Procedure: ESOPHAGOGASTRODUODENOSCOPY (EGD) WITH PROPOFOL;  Surgeon: Lollie Sails, MD;  Location: [facility] ENDOSCOPY;  Service: Endoscopy;  Laterality: N/A;   KYPHOPLASTY N/A 06/12/2017   Procedure: [id number];  Surgeon: Hessie Knows, MD;  Location: [facility] ORS;  Service: Orthopedics;  Laterality: N/A;   TONSILLECTOMY       Home Meds: Prior to Admission medications   Medication Sig Start Date End Date Taking? Authorizing Provider  alendronate (FOSAMAX) 70 MG tablet Take 70 mg by mouth once a week. Take with a full glass of water on an empty stomach.   Yes [provider]  aspirin EC 81 MG tablet Take 81 mg by mouth daily.   Yes [provider]  carvedilol (COREG) 12.5 MG tablet Take by mouth 2 (two) times daily with a meal.    Yes [provider]  ergocalciferol (VITAMIN D2) 50000 units capsule Take 50,000 Units by mouth once a week.   Yes [provider]  iron polysaccharides (NIFEREX) 150 MG capsule Take 150 mg by mouth daily.   Yes [provider]  isosorbide mononitrate (IMDUR) 60 MG 24 hr tablet Take 0.5 tablets (30 mg total) by mouth daily. 06/05/17  Yes Sainani, Belia Heman, MD  KLOR-CON M10 10 MEQ tablet Take 10 mEq by mouth daily. 07/23/17  Yes [provider]  levothyroxine (SYNTHROID, LEVOTHROID) 25 MCG tablet  Take 25 mcg by mouth daily before breakfast.   Yes [provider]  pantoprazole (PROTONIX) 40 MG tablet Take 40 mg by mouth daily.   Yes [provider]  sertraline (ZOLOFT) 50 MG tablet Take 50 mg by mouth daily.   Yes [provider]  torsemide (DEMADEX) 10 MG tablet Take 1 tablet (10 mg total) by mouth daily. 01/08/19  Yes Epifanio Lesches, MD  glucose blood test strip USE TO CHECK BLOOD SUGARS TWICE A DAY 02/19/16   [provider]  JARDIANCE 10 MG TABS tablet  Take 10 mg by mouth daily. 09/23/21   [provider]    Inpatient Medications:   aspirin EC  81 mg Oral Daily   carvedilol  12.5 mg Oral BID WC   furosemide  40 mg Intravenous Daily   heparin  5,000 Units Subcutaneous Q8H   insulin aspart  0-15 Units Subcutaneous TID WC   insulin aspart  0-5 Units Subcutaneous QHS   isosorbide mononitrate  30 mg Oral Daily   levothyroxine  25 mcg Oral QAC breakfast   pantoprazole  40 mg Oral Daily   sertraline  50 mg Oral Daily    nitroGLYCERIN Stopped (11/18/21 0501)    Allergies:  Allergies  Allergen Reactions   Ciprofloxacin    Digoxin And Related    Erythromycin    Lopid [Gemfibrozil]    Nitrofurantoin    Spironolactone Other (See Comments)    Hospitalized for dehydration   Cefuroxime Rash   Penicillins Rash    Has patient had a PCN reaction causing immediate rash, facial/tongue/throat swelling, SOB or lightheadedness with hypotension: Unknown Has patient had a PCN reaction causing severe rash involving mucus membranes or skin necrosis: No Has patient had a PCN reaction that required hospitalization: No Has patient had a PCN reaction occurring within the last 10 years: No If all of the above answers are "NO", then may proceed with Cephalosporin use.     Social History   Socioeconomic History   Marital status: Widowed    Spouse name: Not on file   Number of children: Not on file   Years of education: Not on file   Highest education level: Not on file  Occupational History   Not on file  Tobacco Use   Smoking status: Former    Types: Cigarettes    Quit date: 04/18/1984    Years since quitting: 37.6   Smokeless tobacco: Never  Vaping Use   Vaping Use: Former   Start date: 05/16/1965   Quit date: 05/16/1989  Substance and Sexual Activity   Alcohol use: No   Drug use: No   Sexual activity: Never  Other Topics Concern   Not on file  Social History Narrative   Not on file   Social Determinants of Health    Financial Resource Strain: Not on file  Food Insecurity: Not on file  Transportation Needs: Not on file  Physical Activity: Not on file  Stress: Not on file  Social Connections: Not on file  Intimate Partner Violence: Not on file     Family History  Problem Relation Age of Onset   Premature CHD Brother      Review of Systems Positive for shortness of breath PND orthopnea Negative for: General:  chills, fever, night sweats or weight changes.  Cardiovascular: Positive for PND orthopnea negative for syncope dizziness  Dermatological skin lesions rashes Respiratory: Cough congestion Urologic: Frequent urination urination at night and hematuria Abdominal: negative for nausea, vomiting,  diarrhea, bright red blood per rectum, melena, or hematemesis Neurologic: negative for visual changes, and/or hearing changes  All other systems reviewed and are otherwise negative except as noted above.  Labs: No results for input(s): CKTOTAL, CKMB, TROPONINI in the last 72 hours. Lab Results  Component Value Date   WBC 9.2 11/19/2021   HGB 16.8 (H) 11/19/2021   HCT 52.7 (H) 11/19/2021   MCV 92.1 11/19/2021   PLT 120 (L) 11/19/2021    Recent Labs  Lab 11/18/21 1459 11/18/21 2112 11/19/21 0457  NA 142  --  142  K 3.7  --  3.7  CL 101  --  102  CO2 31  --  33*  BUN QUANTITY NOT SUFFICIENT, UNABLE TO PERFORM TEST   < > 26*  CREATININE QUANTITY NOT SUFFICIENT, UNABLE TO PERFORM TEST   < > 1.09*  CALCIUM 8.8*  --  8.6*  PROT 7.7  --   --   BILITOT 1.1  --   --   ALKPHOS 71  --   --   ALT 24  --   --   AST 29  --   --   GLUCOSE 132*  --  115*   < > = values in this interval not displayed.   No results found for: CHOL, HDL, LDLCALC, TRIG No results found for: DDIMER  Radiology/Studies:  DG Chest Portable 1 View  Result Date: 11/18/2021 CLINICAL DATA:  Shortness of breath and respiratory distress. EXAM: PORTABLE CHEST 1 VIEW COMPARISON:  None Available. FINDINGS: The heart size and  mediastinal contours are within normal limits. There is marked severity calcification and tortuosity of the thoracic aorta. Mild, diffuse chronic appearing increased interstitial lung markings are seen. There is no evidence of focal consolidation, pleural effusion or pneumothorax. Prior vertebroplasty is noted within the midthoracic spine. A stable enchondroma is suspected within the proximal left humerus. IMPRESSION: Chronic appearing increased interstitial lung markings without focal consolidation. A mild superimposed component of interstitial edema cannot be excluded. Electronically Signed   By: Virgina Norfolk M.D.   On: 11/18/2021 01:46   ECHOCARDIOGRAM COMPLETE  Result Date: 11/19/2021    ECHOCARDIOGRAM REPORT   Patient Name:   ELY SPRAGG Date of Exam: 11/18/2021 Medical Rec #:  [id number]     Height:       57.0 in Accession #:    [id number]    Weight:       134.0 lb Date of Birth:  1933/02/02     BSA:          1.517 m Patient Age:    54 years      BP:           183/100 mmHg Patient Gender: F             HR:           78 bpm. Exam Location:  [facility] Procedure: 2D Echo, Color Doppler and Cardiac Doppler Indications:     I50.9 Congestive heart failure  History:         Patient has prior history of Echocardiogram examinations. CHF                  and Cardiomyopathy; Risk Factors:Former Smoker, Diabetes,                  Hypertension and Dyslipidemia.  Sonographer:     Rosalia Hammers Referring Phys:  [id number] ASIA B [place] Diagnosing Phys: Serafina Royals MD  Sonographer Comments: Technically  difficult study due to poor echo windows. Image acquisition challenging due to respiratory motion. IMPRESSIONS  1. Left ventricular ejection fraction, by estimation, is 20 to 25%. The left ventricle has severely decreased function. The left ventricle demonstrates global hypokinesis. The left ventricular internal cavity size was mildly dilated. There is severe left ventricular hypertrophy. Left ventricular diastolic  parameters are consistent with Grade II diastolic dysfunction (pseudonormalization).  2. Right ventricular systolic function is normal. The right ventricular size is normal.  3. Left atrial size was mildly dilated.  4. The mitral valve is normal in structure. Mild mitral valve regurgitation.  5. The aortic valve is normal in structure. Aortic valve regurgitation is trivial. FINDINGS  Left Ventricle: Left ventricular ejection fraction, by estimation, is 20 to 25%. The left ventricle has severely decreased function. The left ventricle demonstrates global hypokinesis. The left ventricular internal cavity size was mildly dilated. There is severe left ventricular hypertrophy. Left ventricular diastolic parameters are consistent with Grade II diastolic dysfunction (pseudonormalization). Right Ventricle: The right ventricular size is normal. No increase in right ventricular wall thickness. Right ventricular systolic function is normal. Left Atrium: Left atrial size was mildly dilated. Right Atrium: Right atrial size was normal in size. Pericardium: There is no evidence of pericardial effusion. Mitral Valve: The mitral valve is normal in structure. Mild mitral valve regurgitation. Tricuspid Valve: The tricuspid valve is normal in structure. Tricuspid valve regurgitation is mild. Aortic Valve: The aortic valve is normal in structure. Aortic valve regurgitation is trivial. Aortic valve mean gradient measures 3.0 mmHg. Aortic valve peak gradient measures 5.7 mmHg. Aortic valve area, by VTI measures 1.61 cm. Pulmonic Valve: The pulmonic valve was normal in structure. Pulmonic valve regurgitation is mild. Aorta: The aortic root and ascending aorta are structurally normal, with no evidence of dilitation. IAS/Shunts: No atrial level shunt detected by color flow Doppler.  LEFT VENTRICLE PLAX 2D LVIDd:         4.54 cm   Diastology LVIDs:         3.25 cm   LV e' medial:  4.03 cm/s LV PW:         1.42 cm   LV e' lateral: 6.74 cm/s LV  IVS:        1.07 cm LVOT diam:     1.70 cm LV SV:         32 LV SV Index:   21 LVOT Area:     2.27 cm  RIGHT VENTRICLE RV Basal diam:  3.77 cm RV S prime:     7.80 cm/s LEFT ATRIUM             Index        RIGHT ATRIUM           Index LA diam:        3.50 cm 2.31 cm/m   RA Area:     14.40 cm LA Vol (A2C):   46.8 ml 30.85 ml/m  RA Volume:   36.50 ml  24.06 ml/m LA Vol (A4C):   40.0 ml 26.37 ml/m LA Biplane Vol: 44.2 ml 29.13 ml/m  AORTIC VALVE                    PULMONIC VALVE AV Area (Vmax):    1.23 cm     PV Vmax:          0.75 m/s AV Area (Vmean):   1.24 cm     PV Vmean:  58.700 cm/s AV Area (VTI):     1.61 cm     PV VTI:           0.154 m AV Vmax:           119.00 cm/s  PV Peak grad:     2.2 mmHg AV Vmean:          77.000 cm/s  PV Mean grad:     1.0 mmHg AV VTI:            0.197 m      PR End Diast Vel: 12.53 msec AV Peak Grad:      5.7 mmHg AV Mean Grad:      3.0 mmHg LVOT Vmax:         64.30 cm/s LVOT Vmean:        42.200 cm/s LVOT VTI:          0.140 m LVOT/AV VTI ratio: 0.71  AORTA Ao Root diam: 3.30 cm  SHUNTS Systemic VTI:  0.14 m Systemic Diam: 1.70 cm Serafina Royals MD Electronically signed by Serafina Royals MD Signature Date/Time: 11/19/2021/1:08:01 PM    Final     EKG: Normal sinus rhythm left atrial enlargement left axis deviation left bundle branch block  Weights: Filed Weights   11/18/21 1830 11/19/21 0500  Weight: 58 kg 58.3 kg     Physical Exam: Blood pressure (!) 130/54, pulse 66, temperature (!) 97.4 F (36.3 C), resp. rate 16, height '4\' 9"'$  (1.448 m), weight 58.3 kg, SpO2 98 %. Body mass index is 27.81 kg/m. General: Well developed, well nourished, in no acute distress. Head eyes ears nose throat: Normocephalic, atraumatic, sclera non-icteric, no xanthomas, nares are without discharge. No apparent thyromegaly and/or mass  Lungs: Normal respiratory effort.  no wheezes, basilar rales, no rhonchi.  Heart: RRR with normal S1 S2. no murmur gallop, no rub, PMI is normal  size and placement, carotid upstroke normal without bruit, jugular venous pressure is normal Abdomen: Soft, non-tender, non-distended with normoactive bowel sounds. No hepatomegaly. No rebound/guarding. No obvious abdominal masses. Abdominal aorta is normal size without bruit Extremities: 1-2+ edema. no cyanosis, no clubbing, no ulcers  Peripheral : 2+ bilateral upper extremity pulses, 2+ bilateral femoral pulses, 2+ bilateral dorsal pedal pulse Neuro: Alert and oriented. No facial asymmetry. No focal deficit. Moves all extremities spontaneously. Musculoskeletal: Normal muscle tone without kyphosis Psych:  Responds to questions appropriately with a normal affect.    Assessment: 88 year old female with acute on chronic systolic dysfunction congestive heart failure chronic kidney disease stage III with a glomerular filtration rate of 49 pulmonary edema diabetes and minimal elevation of troponin consistent with demand ischemia needing further treatment options  Plan: 1.  Continue intravenous Lasix for diuresis to reduce acute accumulation of fluid pulmonary edema and lower extremity edema and congestive heart failure 2.  Continuation of carvedilol for cardiomyopathy for which she is only able to tolerate.  Previously she was unable to tolerate ACE inhibitors due to reaction as well as side effects and/or hypotension. 3.  Continue oxygenation as necessary for O2 sat above 90% 4.  No further cardiac diagnostics necessary at this time 5.  Further continuation of treatment as per above 6.  Cardiac rehab  Signed, Corey Skains M.D. [facility] 11/19/2021, 2:33 PM

## 2021-11-19 NOTE — TOC Initial Note (Signed)
Transition of Care [facility]) - Initial/Assessment Note    Patient Details  Name: Jacqueline [name] MRN: [id number] Date of Birth: 1933/05/05  [facility]) CM/SW Contact:    Eileen Stanford, LCSW Phone Number: 11/19/2021, 11:17 AM  Clinical Narrative:    CSW spoke with pt's daughter regarding services for pt at d/c. Pt's daughter stats pt is not going to work with HHPT she will refuse. CSW will not set up [place] at d/c. PT eval pending.               Expected Discharge Plan: Home/Self Care Barriers to Discharge: Continued Medical Work up   Patient Goals and CMS Choice Patient states their goals for this hospitalization and ongoing recovery are:: to go home   Choice offered to / list presented to : Adult Children  Expected Discharge Plan and Services Expected Discharge Plan: Home/Self Care In-house Referral: Clinical Social Work   Post Acute Care Choice: NA Living arrangements for the past 2 months: Single Family Home                                      Prior Living Arrangements/Services Living arrangements for the past 2 months: Single Family Home Lives with:: Self Patient language and need for interpreter reviewed:: Yes Do you feel safe going back to the place where you live?: Yes      Need for Family Participation in Patient Care: Yes (Comment) Care giver support system in place?: Yes (comment)   Criminal Activity/Legal Involvement Pertinent to Current Situation/Hospitalization: No - Comment as needed  Activities of Daily Living Home Assistive Devices/Equipment: Walker (specify type) ADL Screening (condition at time of admission) Patient's cognitive ability adequate to safely complete daily activities?: Yes Is the patient deaf or have difficulty hearing?: No Does the patient have difficulty seeing, even when wearing glasses/contacts?: No Does the patient have difficulty concentrating, remembering, or making decisions?: No Patient able to express need for  assistance with ADLs?: Yes Does the patient have difficulty dressing or bathing?: No Independently performs ADLs?: No Communication: Independent Dressing (OT): Independent Grooming: Independent Feeding: Independent Bathing: Needs assistance Is this a change from baseline?: Change from baseline, expected to last <3 days Toileting: Needs assistance Is this a change from baseline?: Change from baseline, expected to last <3 days In/Out Bed: Needs assistance Is this a change from baseline?: Change from baseline, expected to last <3 days Walks in Home: Needs assistance Is this a change from baseline?: Change from baseline, expected to last <3 days Does the patient have difficulty walking or climbing stairs?: Yes Weakness of Legs: Both Weakness of Arms/Hands: None  Permission Sought/Granted Permission sought to share information with : Family Supports    Share Information with NAME: joann     Permission granted to share info w Relationship: daughter     Emotional Assessment Appearance:: Appears stated age     Orientation: : Oriented to Self, Oriented to Place, Oriented to  Time Alcohol / Substance Use: Not Applicable Psych Involvement: No (comment)  Admission diagnosis:  Demand ischemia ([place]) [I24.8] Acute exacerbation of CHF (congestive heart failure) ([place]) [I50.9] CHF exacerbation ([place]) [I50.9] Acute respiratory failure with hypoxia ([place]) [J96.01] Acute on chronic congestive heart failure, unspecified heart failure type [facility]) [I50.9] Patient Active Problem List   Diagnosis Date Noted   CHF exacerbation ([place]) 11/18/2021   Elevated troponin 11/18/2021   Hypertensive emergency 11/18/2021  Acute exacerbation of CHF (congestive heart failure) ([place]) 11/18/2021   Thrombocytopenia ([place]) 02/11/2019   Chest pain 01/05/2019   Hyponatremia 06/09/2017   MI, acute, non ST segment elevation ([place]) 06/01/2017   Acute upper back pain 03/05/2017   Carcinoma of unknown primary ([place])  03/05/2017   Liver lesion 02/27/2017   Diabetes ([place]) 53/20/2334   Chronic systolic CHF (congestive heart failure) ([place]) 02/24/2017   HTN (hypertension) 02/24/2017   GERD (gastroesophageal reflux disease) 02/24/2017   Hypothyroidism 02/24/2017   Hypoglycemia 02/24/2017   Dizziness 02/18/2017   PCP:  Tracie Harrier, MD Pharmacy:   [facility], [place] [place] [address] Phone: [phone] Fax: [phone]  [facility] [place], [place] [address] [place] [address] Phone: [phone] Fax: [phone]     Social Determinants of Health (SDOH) Interventions    Readmission Risk Interventions     View : No data to display.

## 2021-11-20 DIAGNOSIS — J9610 Chronic respiratory failure, unspecified whether with hypoxia or hypercapnia: Secondary | ICD-10-CM

## 2021-11-20 DIAGNOSIS — J9611 Chronic respiratory failure with hypoxia: Secondary | ICD-10-CM

## 2021-11-20 DIAGNOSIS — I5023 Acute on chronic systolic (congestive) heart failure: Secondary | ICD-10-CM | POA: Diagnosis not present

## 2021-11-20 DIAGNOSIS — K0889 Other specified disorders of teeth and supporting structures: Secondary | ICD-10-CM

## 2021-11-20 DIAGNOSIS — J9601 Acute respiratory failure with hypoxia: Principal | ICD-10-CM

## 2021-11-20 DIAGNOSIS — J9621 Acute and chronic respiratory failure with hypoxia: Secondary | ICD-10-CM

## 2021-11-20 DIAGNOSIS — E039 Hypothyroidism, unspecified: Secondary | ICD-10-CM | POA: Diagnosis not present

## 2021-11-20 LAB — GLUCOSE, CAPILLARY
Glucose-Capillary: 122 mg/dL — ABNORMAL HIGH (ref 70–99)
Glucose-Capillary: 127 mg/dL — ABNORMAL HIGH (ref 70–99)
Glucose-Capillary: 67 mg/dL — ABNORMAL LOW (ref 70–99)
Glucose-Capillary: 139 mg/dL — ABNORMAL HIGH (ref 70–99)
Glucose-Capillary: 147 mg/dL — ABNORMAL HIGH (ref 70–99)
Glucose-Capillary: 146 mg/dL — ABNORMAL HIGH (ref 70–99)

## 2021-11-20 LAB — CBC
HCT: 52.5 % — ABNORMAL HIGH (ref 36.0–46.0)
Hemoglobin: 16.4 g/dL — ABNORMAL HIGH (ref 12.0–15.0)
MCH: 29.1 pg (ref 26.0–34.0)
MCHC: 31.2 g/dL (ref 30.0–36.0)
MCV: 93.1 fL (ref 80.0–100.0)
Platelets: 94 10*3/uL — ABNORMAL LOW (ref 150–400)
RBC: 5.64 MIL/uL — ABNORMAL HIGH (ref 3.87–5.11)
RDW: 14.6 % (ref 11.5–15.5)
WBC: 4.8 10*3/uL (ref 4.0–10.5)
nRBC: 0 % (ref 0.0–0.2)

## 2021-11-20 LAB — BASIC METABOLIC PANEL
Anion gap: 6 (ref 5–15)
BUN: 32 mg/dL — ABNORMAL HIGH (ref 8–23)
CO2: 34 mmol/L — ABNORMAL HIGH (ref 22–32)
Calcium: 8.6 mg/dL — ABNORMAL LOW (ref 8.9–10.3)
Chloride: 100 mmol/L (ref 98–111)
Creatinine, Ser: 1.07 mg/dL — ABNORMAL HIGH (ref 0.44–1.00)
GFR, Estimated: 50 mL/min — ABNORMAL LOW (ref >60)
Glucose, Bld: 119 mg/dL — ABNORMAL HIGH (ref 70–99)
Potassium: 3.6 mmol/L (ref 3.5–5.1)
Sodium: 140 mmol/L (ref 135–145)

## 2021-11-20 MED ORDER — EMPAGLIFLOZIN 10 MG PO TABS
10.0000 mg | ORAL_TABLET | Freq: Every day | ORAL | Status: DC
  Administered 2021-11-20 – 2021-11-25 (×6): 10 mg via ORAL
  Filled 2021-11-20 (×6): qty 1

## 2021-11-20 MED ORDER — CLINDAMYCIN HCL 150 MG PO CAPS
300.0000 mg | ORAL_CAPSULE | Freq: Three times a day (TID) | ORAL | Status: DC
  Filled 2021-11-20: qty 2

## 2021-11-20 MED ORDER — SACUBITRIL-VALSARTAN 24-26 MG PO TABS
1.0000 | ORAL_TABLET | Freq: Two times a day (BID) | ORAL | Status: DC
  Administered 2021-11-20 – 2021-11-25 (×10): 1 via ORAL
  Filled 2021-11-20 (×10): qty 1

## 2021-11-20 MED ORDER — CLINDAMYCIN HCL 150 MG PO CAPS
300.0000 mg | ORAL_CAPSULE | Freq: Three times a day (TID) | ORAL | Status: AC
Start: 2021-11-20 — End: 2021-11-25
  Administered 2021-11-20 – 2021-11-25 (×15): 300 mg via ORAL
  Filled 2021-11-20 (×15): qty 2

## 2021-11-20 NOTE — Progress Notes (Signed)
[facility]) Hospital Liaison note:  This patient is currently enrolled in [facility] outpatient-based Palliative Care. Will continue to follow for disposition.  Please call with any outpatient palliative questions or concerns.  Thank you, Lorelee Market, LPN [facility] Liaison [phone]

## 2021-11-20 NOTE — Assessment & Plan Note (Addendum)
Continue clindamycin and will give a short course.

## 2021-11-20 NOTE — Assessment & Plan Note (Deleted)
Chronically wears 4 L of oxygen

## 2021-11-20 NOTE — Progress Notes (Signed)
[facility] Encounter Note  [name] / Admit Date: 11/18/2021 / Date of Encounter: 11/20/2021, 2:54 PM   Subjective: Patient has significant decreased extremity abdominal distention and slight improvements. In the lower lung field.  The patient's overall accumulated urine output does not appear to be in significant.  Has had a decrease in oxygen requirement.  We have discussed at length that the patient previously was not able to tolerate Entresto due to hypotension and patient may be able to retry if necessary.  We have discussed at length other documented side effects of spironolactone.  We also have discussed the possibility of other guideline treatment.  Review of Systems: Positive for: Shortness of breath Negative for: Vision change, hearing change, syncope, dizziness, nausea, vomiting,diarrhea, bloody stool, stomach pain, cough, congestion, diaphoresis, urinary frequency, urinary pain,skin lesions, skin rashes Others previously listed  Objective: Telemetry: Normal sinus rhythm Physical Exam: Blood pressure (!) 148/66, pulse 65, temperature 98.2 F (36.8 C), resp. rate 16, height '4\' 9"'$  (1.448 m), weight 57.8 kg, SpO2 99 %. Body mass index is 27.57 kg/m. General: Well developed, well nourished, in no acute distress. Head: Normocephalic, atraumatic, sclera non-icteric, no xanthomas, nares are without discharge. Neck: No apparent masses Lungs: Normal respirations with no wheezes, no rhonchi, continue rales , no crackles   Heart: Regular rate and rhythm, normal S1 S2, no murmur, no rub, no gallop, PMI is normal size and placement, carotid upstroke normal without bruit, jugular venous pressure normal Abdomen: Soft, non-tender, non-distended with normoactive bowel sounds. No hepatosplenomegaly. Abdominal aorta is normal size without bruit Extremities: Trace edema, no clubbing, no cyanosis, no ulcers,  Peripheral: 2+ radial, 2+ femoral, 2+ dorsal pedal  pulses Neuro: Alert and oriented. Moves all extremities spontaneously. Psych:  Responds to questions appropriately with a normal affect.   Intake/Output Summary (Last 24 hours) at 11/20/2021 1454 Last data filed at 11/20/2021 1300 Gross per 24 hour  Intake 360 ml  Output 500 ml  Net -140 ml    Inpatient Medications:   aspirin EC  81 mg Oral Daily   carvedilol  12.5 mg Oral BID WC   clindamycin  300 mg Oral Q8H   empagliflozin  10 mg Oral Daily   furosemide  40 mg Intravenous Daily   heparin  5,000 Units Subcutaneous Q8H   insulin aspart  0-15 Units Subcutaneous TID WC   insulin aspart  0-5 Units Subcutaneous QHS   isosorbide mononitrate  30 mg Oral Daily   levothyroxine  25 mcg Oral QAC breakfast   pantoprazole  40 mg Oral Daily   sertraline  50 mg Oral Daily   Infusions:   nitroGLYCERIN Stopped (11/18/21 0501)    Labs: Recent Labs    11/18/21 1459 11/18/21 2112 11/19/21 0457 11/20/21 0539  NA 142  --  142 140  K 3.7  --  3.7 3.6  CL 101  --  102 100  CO2 31  --  33* 34*  GLUCOSE 132*  --  115* 119*  BUN QUANTITY NOT SUFFICIENT, UNABLE TO PERFORM TEST   < > 26* 32*  CREATININE QUANTITY NOT SUFFICIENT, UNABLE TO PERFORM TEST   < > 1.09* 1.07*  CALCIUM 8.8*  --  8.6* 8.6*  MG 2.4  --  2.5*  --    < > = values in this interval not displayed.   Recent Labs    11/18/21 1459  AST 29  ALT 24  ALKPHOS 71  BILITOT 1.1  PROT 7.7  ALBUMIN 3.8   Recent Labs    11/18/21 0136 11/18/21 1459 11/19/21 0457 11/20/21 0539  WBC 4.8 11.7* 9.2 4.8  NEUTROABS 2.4 8.2*  --   --   HGB 16.7* 18.2* 16.8* 16.4*  HCT 53.6* 58.3* 52.7* 52.5*  MCV 93.1 94.5 92.1 93.1  PLT 101* 110* 120* 94*   No results for input(s): CKTOTAL, CKMB, TROPONINI in the last 72 hours. Invalid input(s): POCBNP Recent Labs    11/18/21 1459  HGBA1C 6.5*     Weights: Filed Weights   11/18/21 1830 11/19/21 0500 11/20/21 0500  Weight: 58 kg 58.3 kg 57.8 kg     Radiology/Studies:  DG Chest  Portable 1 View  Result Date: 11/18/2021 CLINICAL DATA:  Shortness of breath and respiratory distress. EXAM: PORTABLE CHEST 1 VIEW COMPARISON:  None Available. FINDINGS: The heart size and mediastinal contours are within normal limits. There is marked severity calcification and tortuosity of the thoracic aorta. Mild, diffuse chronic appearing increased interstitial lung markings are seen. There is no evidence of focal consolidation, pleural effusion or pneumothorax. Prior vertebroplasty is noted within the midthoracic spine. A stable enchondroma is suspected within the proximal left humerus. IMPRESSION: Chronic appearing increased interstitial lung markings without focal consolidation. A mild superimposed component of interstitial edema cannot be excluded. Electronically Signed   By: Virgina Norfolk M.D.   On: 11/18/2021 01:46   ECHOCARDIOGRAM COMPLETE  Result Date: 11/19/2021    ECHOCARDIOGRAM REPORT   Patient Name:   Jacqueline [name] Date of Exam: 11/18/2021 Medical Rec #:  [id number]     Height:       57.0 in Accession #:    [id number]    Weight:       134.0 lb Date of Birth:  04-Dec-1932     BSA:          1.517 m Patient Age:    4 years      BP:           183/100 mmHg Patient Gender: F             HR:           78 bpm. Exam Location:  [facility] Procedure: 2D Echo, Color Doppler and Cardiac Doppler Indications:     I50.9 Congestive heart failure  History:         Patient has prior history of Echocardiogram examinations. CHF                  and Cardiomyopathy; Risk Factors:Former Smoker, Diabetes,                  Hypertension and Dyslipidemia.  Sonographer:     Rosalia Hammers Referring Phys:  [id number] ASIA B [place] Diagnosing Phys: Serafina Royals MD  Sonographer Comments: Technically difficult study due to poor echo windows. Image acquisition challenging due to respiratory motion. IMPRESSIONS  1. Left ventricular ejection fraction, by estimation, is 20 to 25%. The left ventricle has severely decreased function.  The left ventricle demonstrates global hypokinesis. The left ventricular internal cavity size was mildly dilated. There is severe left ventricular hypertrophy. Left ventricular diastolic parameters are consistent with Grade II diastolic dysfunction (pseudonormalization).  2. Right ventricular systolic function is normal. The right ventricular size is normal.  3. Left atrial size was mildly dilated.  4. The mitral valve is normal in structure. Mild mitral valve regurgitation.  5. The aortic valve is normal in structure. Aortic valve regurgitation is trivial. FINDINGS  Left Ventricle: Left  ventricular ejection fraction, by estimation, is 20 to 25%. The left ventricle has severely decreased function. The left ventricle demonstrates global hypokinesis. The left ventricular internal cavity size was mildly dilated. There is severe left ventricular hypertrophy. Left ventricular diastolic parameters are consistent with Grade II diastolic dysfunction (pseudonormalization). Right Ventricle: The right ventricular size is normal. No increase in right ventricular wall thickness. Right ventricular systolic function is normal. Left Atrium: Left atrial size was mildly dilated. Right Atrium: Right atrial size was normal in size. Pericardium: There is no evidence of pericardial effusion. Mitral Valve: The mitral valve is normal in structure. Mild mitral valve regurgitation. Tricuspid Valve: The tricuspid valve is normal in structure. Tricuspid valve regurgitation is mild. Aortic Valve: The aortic valve is normal in structure. Aortic valve regurgitation is trivial. Aortic valve mean gradient measures 3.0 mmHg. Aortic valve peak gradient measures 5.7 mmHg. Aortic valve area, by VTI measures 1.61 cm. Pulmonic Valve: The pulmonic valve was normal in structure. Pulmonic valve regurgitation is mild. Aorta: The aortic root and ascending aorta are structurally normal, with no evidence of dilitation. IAS/Shunts: No atrial level shunt  detected by color flow Doppler.  LEFT VENTRICLE PLAX 2D LVIDd:         4.54 cm   Diastology LVIDs:         3.25 cm   LV e' medial:  4.03 cm/s LV PW:         1.42 cm   LV e' lateral: 6.74 cm/s LV IVS:        1.07 cm LVOT diam:     1.70 cm LV SV:         32 LV SV Index:   21 LVOT Area:     2.27 cm  RIGHT VENTRICLE RV Basal diam:  3.77 cm RV S prime:     7.80 cm/s LEFT ATRIUM             Index        RIGHT ATRIUM           Index LA diam:        3.50 cm 2.31 cm/m   RA Area:     14.40 cm LA Vol (A2C):   46.8 ml 30.85 ml/m  RA Volume:   36.50 ml  24.06 ml/m LA Vol (A4C):   40.0 ml 26.37 ml/m LA Biplane Vol: 44.2 ml 29.13 ml/m  AORTIC VALVE                    PULMONIC VALVE AV Area (Vmax):    1.23 cm     PV Vmax:          0.75 m/s AV Area (Vmean):   1.24 cm     PV Vmean:         58.700 cm/s AV Area (VTI):     1.61 cm     PV VTI:           0.154 m AV Vmax:           119.00 cm/s  PV Peak grad:     2.2 mmHg AV Vmean:          77.000 cm/s  PV Mean grad:     1.0 mmHg AV VTI:            0.197 m      PR End Diast Vel: 12.53 msec AV Peak Grad:      5.7 mmHg AV Mean Grad:  3.0 mmHg LVOT Vmax:         64.30 cm/s LVOT Vmean:        42.200 cm/s LVOT VTI:          0.140 m LVOT/AV VTI ratio: 0.71  AORTA Ao Root diam: 3.30 cm  SHUNTS Systemic VTI:  0.14 m Systemic Diam: 1.70 cm Serafina Royals MD Electronically signed by Serafina Royals MD Signature Date/Time: 11/19/2021/1:08:01 PM    Final      Assessment and Recommendation  88 y.o. female with known acute on chronic systolic dysfunction congestive heart failure chronic kidney disease stage III glomerular filtration 29 improved on appropriate medication management needing from medical therapy 1.  Begin Jardiance 10 mg each day for further guideline medical therapy 2.  Avoid spironolactone due to previous side effects 3.  Begin low-dose Entresto for other possible guideline medical therapy 4.  Continue intravenous furosemide for further risk reduction in congestive heart  failure 5.  Cardiac rehabilitation 6.  Begin ambulation Problem follow-up for potential improvements of symptoms and possible discharge home and follow-up thereafter  Signed, Serafina Royals M.D. FACC

## 2021-11-20 NOTE — Progress Notes (Signed)
  Progress Note   [name] VOH:[id number] DOB: [birth year] DOA: 11/18/2021     2 DOS: the patient was seen and examined on 11/20/2021    Assessment and Plan: * Acute on chronic systolic CHF (congestive heart failure) (HCC) EF 20 to 25% on last echocardiogram.  Restart Jardiance.  Trial of low-dose Entresto as per cardiology and pharmacist.  Continue IV Lasix.  Chronic respiratory failure with hypoxia (HCC) Chronically wears 4 L of oxygen  Pain in a tooth or teeth We will start low-dose clindamycin.  Elevated troponin Demand ischemia from high blood pressure and heart failure  Hypothyroidism Continue Synthroid   HTN (hypertension) - Continue Coreg, Imdur, Lasix.  Entresto started today.  Diabetes ([place]) Restarted Jardiance        Subjective: Patient complains of a little pain in her neck and teeth.  Feels like she is breathing better than when she came in.  Admitted with CHF exacerbation.  Physical Exam: Vitals:   11/20/21 0500 11/20/21 0533 11/20/21 0813 11/20/21 1205  BP:  (!) 171/73 (!) 147/78 (!) 148/66  Pulse:  63 62 65  Resp:  '18 16 16  '$ Temp:  (!) 97.5 F (36.4 C) 98.5 F (36.9 C) 98.2 F (36.8 C)  TempSrc:      SpO2:  97% 97% 99%  Weight: 57.8 kg     Height:       Physical Exam HENT:     Head: Normocephalic.     Mouth/Throat:     Pharynx: No oropharyngeal exudate.  Eyes:     General: Lids are normal.     Conjunctiva/sclera: Conjunctivae normal.  Neck:     Comments: Lymph node left neck underneath the jaw. Cardiovascular:     Rate and Rhythm: Normal rate and regular rhythm.     Heart sounds: Normal heart sounds, S1 normal and S2 normal.  Pulmonary:     Breath sounds: Examination of the right-lower field reveals decreased breath sounds and rales. Examination of the left-lower field reveals decreased breath sounds and rales. Decreased breath sounds and rales present. No wheezing or rhonchi.  Abdominal:     Palpations: Abdomen is soft.      Tenderness: There is no abdominal tenderness.  Musculoskeletal:     Right lower leg: No swelling.     Left lower leg: No swelling.  Skin:    General: Skin is warm.     Findings: No rash.  Neurological:     Mental Status: She is alert and oriented to person, place, and time.    Data Reviewed: Creatinine 1.07, hemoglobin 16.4, platelet count 94   Family Communication: Spoke with daughter at the bedside  Disposition: Status is: Inpatient Remains inpatient appropriate because: Being treated for CHF exacerbation with IV Lasix  Planned Discharge Destination: Home   Author: Loletha Grayer, MD 11/20/2021 4:52 PM  For on call review [email or web address].

## 2021-11-20 NOTE — Assessment & Plan Note (Addendum)
EF 20 to 25% on last echocardiogram.  Restart Jardiance.  Continue IV Lasix while here.  So far tolerating low-dose Entresto.

## 2021-11-21 ENCOUNTER — Other Ambulatory Visit: Payer: Medicare Other | Admitting: Student

## 2021-11-21 DIAGNOSIS — K0889 Other specified disorders of teeth and supporting structures: Secondary | ICD-10-CM | POA: Diagnosis not present

## 2021-11-21 DIAGNOSIS — N1831 Chronic kidney disease, stage 3a: Secondary | ICD-10-CM

## 2021-11-21 DIAGNOSIS — J9621 Acute and chronic respiratory failure with hypoxia: Secondary | ICD-10-CM | POA: Diagnosis not present

## 2021-11-21 DIAGNOSIS — N183 Chronic kidney disease, stage 3 unspecified: Secondary | ICD-10-CM

## 2021-11-21 DIAGNOSIS — I5023 Acute on chronic systolic (congestive) heart failure: Secondary | ICD-10-CM | POA: Diagnosis not present

## 2021-11-21 DIAGNOSIS — E1122 Type 2 diabetes mellitus with diabetic chronic kidney disease: Secondary | ICD-10-CM

## 2021-11-21 DIAGNOSIS — D696 Thrombocytopenia, unspecified: Secondary | ICD-10-CM

## 2021-11-21 DIAGNOSIS — I10 Essential (primary) hypertension: Secondary | ICD-10-CM | POA: Diagnosis not present

## 2021-11-21 LAB — BASIC METABOLIC PANEL
Anion gap: 6 (ref 5–15)
BUN: 31 mg/dL — ABNORMAL HIGH (ref 8–23)
CO2: 32 mmol/L (ref 22–32)
Calcium: 8.9 mg/dL (ref 8.9–10.3)
Chloride: 101 mmol/L (ref 98–111)
Creatinine, Ser: 0.94 mg/dL (ref 0.44–1.00)
GFR, Estimated: 59 mL/min — ABNORMAL LOW (ref >60)
Glucose, Bld: 131 mg/dL — ABNORMAL HIGH (ref 70–99)
Potassium: 4 mmol/L (ref 3.5–5.1)
Sodium: 139 mmol/L (ref 135–145)

## 2021-11-21 LAB — GLUCOSE, CAPILLARY
Glucose-Capillary: 148 mg/dL — ABNORMAL HIGH (ref 70–99)
Glucose-Capillary: 154 mg/dL — ABNORMAL HIGH (ref 70–99)
Glucose-Capillary: 154 mg/dL — ABNORMAL HIGH (ref 70–99)
Glucose-Capillary: 157 mg/dL — ABNORMAL HIGH (ref 70–99)
Glucose-Capillary: 150 mg/dL — ABNORMAL HIGH (ref 70–99)

## 2021-11-21 MED ORDER — FUROSEMIDE 10 MG/ML IJ SOLN
20.0000 mg | Freq: Once | INTRAMUSCULAR | Status: AC
  Administered 2021-11-21: 20 mg via INTRAVENOUS
  Filled 2021-11-21: qty 2

## 2021-11-21 NOTE — Assessment & Plan Note (Signed)
CKD stage IIIa 

## 2021-11-21 NOTE — Evaluation (Deleted)
Physical Therapy Evaluation Patient Details Name: Jacqueline [name] MRN: [id number] DOB: 03-May-1933 Today's Date: 11/21/2021  History of Present Illness  pt is a 88 y.o. female with medical history significant of congestive heart failure, diabetes mellitus type 2, dilated idiopathic cardiomyopathy, GERD, hyperlipidemia, hypertension, hypothyroidism, and renal disorder presents the ED with a chief complaint of shortness of breath and weakness. MD assessment includes Acute on chronic systolic CHF (congestive heart failure) ([place]), Chronic respiratory failure with hypoxia ([place]), Elevated troponin.  Clinical Impression  Pt was pleasant and motivated to participate during the session and put forth good effort throughout. Pt was HOH and somewhat poor historian for recalling information for living situation; daughter was called to confirm information. Pt was able to perform bed mobility w/ min A mainly for completing scoot EOB and needed extra time and effort to perform task. Pt was able to complete sit to stand to RW w/ minA to initiate stand. Pt complete bed to char transfer and able to side step with min foot clearance and modA for steadying while using RW. Pt was left in recliner with all needs met. Given the pt's recent change in PLOF and currently having only intermittent assist at home pt will benefit from  Acute inpatient rehab upon discharge to safely address deficits listed in patient problem list for decreased caregiver assistance and eventual return to PLOF.      Recommendations for follow up therapy are one component of a multi-disciplinary discharge planning process, led by the attending physician.  Recommendations may be updated based on patient status, additional functional criteria and insurance authorization.  Follow Up Recommendations Acute inpatient rehab (3hours/day)    Assistance Recommended at Discharge Intermittent Supervision/Assistance  Patient can return home with the following  A  little help with walking and/or transfers;A little help with bathing/dressing/bathroom;Assistance with cooking/housework;Help with stairs or ramp for entrance    Equipment Recommendations Rolling walker (2 wheels);BSC/3in1  Recommendations for Other Services  Rehab consult    Functional Status Assessment Patient has had a recent decline in their functional status and demonstrates the ability to make significant improvements in function in a reasonable and predictable amount of time.     Precautions / Restrictions Precautions Precautions: Fall Restrictions Weight Bearing Restrictions: No      Mobility  Bed Mobility Overal bed mobility: Needs Assistance Bed Mobility: Supine to Sit     Supine to sit: Min assist     General bed mobility comments: extra time and effort required to peform task and minA to scoot EOB    Transfers Overall transfer level: Needs assistance Equipment used: Rolling walker (2 wheels) Transfers: Sit to/from Stand Sit to Stand: Min assist           General transfer comment: required extra time and effort and min A to initiate stand    Ambulation/Gait Ambulation/Gait assistance: Mod assist Gait Distance (Feet): 3 Feet Assistive device: Rolling walker (2 wheels) Gait Pattern/deviations: Step-to pattern, Decreased step length - right, Decreased step length - left       General Gait Details: pt able to sidestep to recliner w/ min foot clearance and mod A for steadying  Stairs            Wheelchair Mobility    Modified Rankin (Stroke Patients Only)       Balance Overall balance assessment: Needs assistance Sitting-balance support: Bilateral upper extremity supported, Feet supported Sitting balance-Leahy Scale: Fair     Standing balance support: Reliant on assistive device for balance,  During functional activity Standing balance-Leahy Scale: Fair                               Pertinent Vitals/Pain Pain  Assessment Pain Assessment:  (pain lvl not communicated)    Home Living Family/patient expects to be discharged to:: Private residence Living Arrangements: Alone Available Help at Discharge: Family;Personal care attendant;Available PRN/intermittently Type of Home: House Home Access: Ramped entrance;Stairs to enter (Ramp w/ 1 step to enter)   Entrance Stairs-Number of Steps: 1   Home Layout: One level Home Equipment: Rollator (4 wheels);Grab bars - tub/shower;Shower seat      Prior Function Prior Level of Function : Independent/Modified Independent             Mobility Comments: Independent household ambulator using rollator ADLs Comments: Daugher and aide assist with cooking/cleaning/grocery/errands/bathing. pt primarily homebound     Hand Dominance        Extremity/Trunk Assessment   Upper Extremity Assessment Upper Extremity Assessment: Overall WFL for tasks assessed    Lower Extremity Assessment Lower Extremity Assessment: Generalized weakness       Communication   Communication: HOH  Cognition Arousal/Alertness: Awake/alert Behavior During Therapy: WFL for tasks assessed/performed Overall Cognitive Status: Within Functional Limits for tasks assessed                                 General Comments: Pt somewhat poor historian        General Comments      Exercises     Assessment/Plan    PT Assessment Patient needs continued PT services  PT Problem List Decreased strength;Decreased mobility;Decreased activity tolerance;Decreased balance       PT Treatment Interventions DME instruction;Therapeutic exercise;Balance training;Gait training;Stair training;Functional mobility training;Therapeutic activities;Patient/family education    PT Goals (Current goals can be found in the Care Plan section)  Acute Rehab PT Goals Patient Stated Goal: none stated Potential to Achieve Goals: Fair    Frequency Min 2X/week     Co-evaluation                AM-PAC PT "6 Clicks" Mobility  Outcome Measure Help needed turning from your back to your side while in a flat bed without using bedrails?: A Little Help needed moving from lying on your back to sitting on the side of a flat bed without using bedrails?: A Little Help needed moving to and from a bed to a chair (including a wheelchair)?: A Lot Help needed standing up from a chair using your arms (e.g., wheelchair or bedside chair)?: A Little Help needed to walk in hospital room?: A Lot Help needed climbing 3-5 steps with a railing? : A Lot 6 Click Score: 15    End of Session Equipment Utilized During Treatment: Gait belt Activity Tolerance: Patient tolerated treatment well;Patient limited by fatigue Patient left: in chair;with call bell/phone within reach;with chair alarm set;with nursing/sitter in room Nurse Communication: Mobility status PT Visit Diagnosis: Difficulty in walking, not elsewhere classified (R26.2);Muscle weakness (generalized) (M62.81);Unsteadiness on feet (R26.81)    Time: 1010-1039 PT Time Calculation (min) (ACUTE ONLY): 29 min   Charges:   PT Evaluation $PT Eval Low Complexity: 1 Low PT Treatments $Therapeutic Activity: 8-22 mins        Oliver Tuisa, SPT  11/21/2021, 1:01 PM  

## 2021-11-21 NOTE — Care Management Important Message (Signed)
Important Message  Patient Details  Name: Jacqueline [name] MRN: [id number] Date of Birth: March 12, 1933   Medicare Important Message Given:  Yes     Dannette Barbara 11/21/2021, 1:33 PM

## 2021-11-21 NOTE — Assessment & Plan Note (Addendum)
Prior to coming in the hospital patient had a pulse ox in the 70s by EMS.  Came to the hospital on CPAP and was switched over to BiPAP and given Lasix.  ER physician documented increased work of breathing and respiratory distress and tachypnea.  Patient chronically wears 4 L of oxygen at home and currently back to 4 L.  Patient desaturated only to 36 with working with PT yesterday.

## 2021-11-21 NOTE — Assessment & Plan Note (Addendum)
Chronic thrombocytopenia 

## 2021-11-21 NOTE — Evaluation (Signed)
Physical Therapy Evaluation Patient Details Name: KAREEMAH GROUNDS MRN: [id number] DOB: January 17, 1933 Today's Date: 11/21/2021  History of Present Illness  pt is a 88 y.o. female with medical history significant of congestive heart failure, diabetes mellitus type 2, dilated idiopathic cardiomyopathy, GERD, hyperlipidemia, hypertension, hypothyroidism, and renal disorder presents the ED with a chief complaint of shortness of breath and weakness. MD assessment includes Acute on chronic systolic CHF (congestive heart failure) ([place]), Chronic respiratory failure with hypoxia ([place]), Elevated troponin.  Clinical Impression  Pt was pleasant and motivated to participate during the session and put forth good effort throughout. Pt was HOH and somewhat poor historian for recalling information for living situation; daughter was called to confirm information. Pt was able to perform bed mobility w/ min A mainly for completing scoot EOB and needed extra time and effort to perform task. Pt was able to complete sit to stand to RW w/ minA to initiate stand. Pt complete bed to char transfer and able to side step with min foot clearance and modA for steadying while using RW. Pt was left in recliner with all needs met. Given the pt's recent change in PLOF and currently having only intermittent assist at home pt will benefit from PT services in a SNF setting upon discharge to safely address deficits listed in patient problem list for decreased caregiver assistance and eventual return to PLOF.      Recommendations for follow up therapy are one component of a multi-disciplinary discharge planning process, led by the attending physician.  Recommendations may be updated based on patient status, additional functional criteria and insurance authorization.  Follow Up Recommendations Skilled nursing-short term rehab (<3 hours/day)    Assistance Recommended at Discharge Intermittent Supervision/Assistance  Patient can return home with  the following  A little help with walking and/or transfers;A little help with bathing/dressing/bathroom;Assistance with cooking/housework;Help with stairs or ramp for entrance    Equipment Recommendations Rolling walker (2 wheels);BSC/3in1  Recommendations for Other Services  Rehab consult    Functional Status Assessment Patient has had a recent decline in their functional status and demonstrates the ability to make significant improvements in function in a reasonable and predictable amount of time.     Precautions / Restrictions Precautions Precautions: Fall Restrictions Weight Bearing Restrictions: No      Mobility  Bed Mobility Overal bed mobility: Needs Assistance Bed Mobility: Supine to Sit     Supine to sit: Min assist     General bed mobility comments: extra time and effort required to peform task and minA to scoot EOB    Transfers Overall transfer level: Needs assistance Equipment used: Rolling walker (2 wheels) Transfers: Sit to/from Stand Sit to Stand: Min assist           General transfer comment: required extra time and effort and min A to initiate stand    Ambulation/Gait Ambulation/Gait assistance: Mod assist Gait Distance (Feet): 3 Feet Assistive device: Rolling walker (2 wheels) Gait Pattern/deviations: Step-to pattern, Decreased step length - right, Decreased step length - left       General Gait Details: pt able to sidestep to recliner w/ min foot clearance and mod A for steadying  Stairs            Wheelchair Mobility    Modified Rankin (Stroke Patients Only)       Balance Overall balance assessment: Needs assistance Sitting-balance support: Bilateral upper extremity supported, Feet supported Sitting balance-Leahy Scale: Fair     Standing balance support: Reliant on  assistive device for balance, During functional activity Standing balance-Leahy Scale: Fair                               Pertinent Vitals/Pain  Pain Assessment Pain Assessment:  (pain lvl not communicated)    Home Living Family/patient expects to be discharged to:: Private residence Living Arrangements: Alone Available Help at Discharge: Family;Personal care attendant;Available PRN/intermittently Type of Home: House Home Access: Ramped entrance;Stairs to enter (Ramp w/ 1 step to enter)   Entrance Stairs-Number of Steps: 1   Home Layout: One level Home Equipment: Rollator (4 wheels);Grab bars - tub/shower;Shower seat      Prior Function Prior Level of Function : Independent/Modified Independent             Mobility Comments: Independent household ambulator using rollator ADLs Comments: Daugher and aide assist with cooking/cleaning/grocery/errands/bathing. pt primarily homebound     Hand Dominance        Extremity/Trunk Assessment   Upper Extremity Assessment Upper Extremity Assessment: Overall WFL for tasks assessed    Lower Extremity Assessment Lower Extremity Assessment: Generalized weakness       Communication   Communication: HOH  Cognition Arousal/Alertness: Awake/alert Behavior During Therapy: WFL for tasks assessed/performed Overall Cognitive Status: Within Functional Limits for tasks assessed                                 General Comments: Pt somewhat poor historian        General Comments      Exercises     Assessment/Plan    PT Assessment Patient needs continued PT services  PT Problem List Decreased strength;Decreased mobility;Decreased activity tolerance;Decreased balance       PT Treatment Interventions DME instruction;Therapeutic exercise;Balance training;Gait training;Stair training;Functional mobility training;Therapeutic activities;Patient/family education    PT Goals (Current goals can be found in the Care Plan section)  Acute Rehab PT Goals Patient Stated Goal: none stated Potential to Achieve Goals: Fair    Frequency Min 2X/week     Co-evaluation                AM-PAC PT "6 Clicks" Mobility  Outcome Measure Help needed turning from your back to your side while in a flat bed without using bedrails?: A Little Help needed moving from lying on your back to sitting on the side of a flat bed without using bedrails?: A Little Help needed moving to and from a bed to a chair (including a wheelchair)?: A Lot Help needed standing up from a chair using your arms (e.g., wheelchair or bedside chair)?: A Little Help needed to walk in hospital room?: A Lot Help needed climbing 3-5 steps with a railing? : A Lot 6 Click Score: 15    End of Session Equipment Utilized During Treatment: Gait belt Activity Tolerance: Patient tolerated treatment well;Patient limited by fatigue Patient left: in chair;with call bell/phone within reach;with chair alarm set;with nursing/sitter in room Nurse Communication: Mobility status PT Visit Diagnosis: Difficulty in walking, not elsewhere classified (R26.2);Muscle weakness (generalized) (M62.81);Unsteadiness on feet (R26.81)    Time: 1010-1039 PT Time Calculation (min) (ACUTE ONLY): 29 min   Charges:   PT Evaluation $PT Eval Low Complexity: 1 Low PT Treatments $Therapeutic Activity: 8-22 mins        Turner Daniels, SPT  11/21/2021, 1:08 PM

## 2021-11-21 NOTE — Progress Notes (Signed)
[facility] Encounter Note  [name] / Admit Date: 11/18/2021 / Date of Encounter: 11/21/2021, 8:47 AM   Subjective: Patient has significant decreased extremity abdominal distention and slight improvements. In the lower lung field.  The patient's overall accumulated urine output does not appear to be in significant.  Has had a decrease in oxygen requirement.  We have discussed at length that the patient previously was not able to tolerate Entresto due to hypotension and patient may be able to retry if necessary.  We have discussed at length other documented side effects of spironolactone.  We also have discussed the possibility of other guideline treatment.  The patient does need further evaluation for the possibility of long-term care facilities  Review of Systems: Positive for: Shortness of breath Negative for: Vision change, hearing change, syncope, dizziness, nausea, vomiting,diarrhea, bloody stool, stomach pain, cough, congestion, diaphoresis, urinary frequency, urinary pain,skin lesions, skin rashes Others previously listed  Objective: Telemetry: Normal sinus rhythm Physical Exam: Blood pressure (!) 156/73, pulse 70, temperature 97.7 F (36.5 C), temperature source Oral, resp. rate 18, height '4\' 9"'$  (1.448 m), weight 57.8 kg, SpO2 94 %. Body mass index is 27.57 kg/m. General: Well developed, well nourished, in no acute distress. Head: Normocephalic, atraumatic, sclera non-icteric, no xanthomas, nares are without discharge. Neck: No apparent masses Lungs: Normal respirations with no wheezes, no rhonchi, continue rales , no crackles   Heart: Regular rate and rhythm, normal S1 S2, no murmur, no rub, no gallop, PMI is normal size and placement, carotid upstroke normal without bruit, jugular venous pressure normal Abdomen: Soft, non-tender, non-distended with normoactive bowel sounds. No hepatosplenomegaly. Abdominal aorta is normal size without  bruit Extremities: Trace edema, no clubbing, no cyanosis, no ulcers,  Peripheral: 2+ radial, 2+ femoral, 2+ dorsal pedal pulses Neuro: Alert and oriented. Moves all extremities spontaneously. Psych:  Responds to questions appropriately with a normal affect.   Intake/Output Summary (Last 24 hours) at 11/21/2021 0847 Last data filed at 11/21/2021 0500 Gross per 24 hour  Intake 600 ml  Output 275 ml  Net 325 ml     Inpatient Medications:   aspirin EC  81 mg Oral Daily   carvedilol  12.5 mg Oral BID WC   clindamycin  300 mg Oral Q8H   empagliflozin  10 mg Oral Daily   furosemide  40 mg Intravenous Daily   heparin  5,000 Units Subcutaneous Q8H   insulin aspart  0-15 Units Subcutaneous TID WC   insulin aspart  0-5 Units Subcutaneous QHS   isosorbide mononitrate  30 mg Oral Daily   levothyroxine  25 mcg Oral QAC breakfast   pantoprazole  40 mg Oral Daily   sacubitril-valsartan  1 tablet Oral BID   sertraline  50 mg Oral Daily   Infusions:   nitroGLYCERIN Stopped (11/18/21 0501)    Labs: Recent Labs    11/18/21 1459 11/18/21 2112 11/19/21 0457 11/20/21 0539 11/21/21 0749  NA 142  --  142 140 139  K 3.7  --  3.7 3.6 4.0  CL 101  --  102 100 101  CO2 31  --  33* 34* 32  GLUCOSE 132*  --  115* 119* 131*  BUN QUANTITY NOT SUFFICIENT, UNABLE TO PERFORM TEST   < > 26* 32* 31*  CREATININE QUANTITY NOT SUFFICIENT, UNABLE TO PERFORM TEST   < > 1.09* 1.07* 0.94  CALCIUM 8.8*  --  8.6* 8.6* 8.9  MG 2.4  --  2.5*  --   --    < > =  values in this interval not displayed.    Recent Labs    11/18/21 1459  AST 29  ALT 24  ALKPHOS 71  BILITOT 1.1  PROT 7.7  ALBUMIN 3.8    Recent Labs    11/18/21 1459 11/19/21 0457 11/20/21 0539  WBC 11.7* 9.2 4.8  NEUTROABS 8.2*  --   --   HGB 18.2* 16.8* 16.4*  HCT 58.3* 52.7* 52.5*  MCV 94.5 92.1 93.1  PLT 110* 120* 94*    No results for input(s): "CKTOTAL", "CKMB", "TROPONINI" in the last 72 hours. Invalid input(s):  "POCBNP" Recent Labs    11/18/21 1459  HGBA1C 6.5*      Weights: Filed Weights   11/18/21 1830 11/19/21 0500 11/20/21 0500  Weight: 58 kg 58.3 kg 57.8 kg     Radiology/Studies:  ECHOCARDIOGRAM COMPLETE  Result Date: 11/19/2021    ECHOCARDIOGRAM REPORT   Patient Name:   Jacqueline [name] Date of Exam: 11/18/2021 Medical Rec #:  [id number]     Height:       57.0 in Accession #:    [id number]    Weight:       134.0 lb Date of Birth:  01-27-1933     BSA:          1.517 m Patient Age:    64 years      BP:           183/100 mmHg Patient Gender: F             HR:           78 bpm. Exam Location:  [facility] Procedure: 2D Echo, Color Doppler and Cardiac Doppler Indications:     I50.9 Congestive heart failure  History:         Patient has prior history of Echocardiogram examinations. CHF                  and Cardiomyopathy; Risk Factors:Former Smoker, Diabetes,                  Hypertension and Dyslipidemia.  Sonographer:     Rosalia Hammers Referring Phys:  [id number] ASIA B [place] Diagnosing Phys: Serafina Royals MD  Sonographer Comments: Technically difficult study due to poor echo windows. Image acquisition challenging due to respiratory motion. IMPRESSIONS  1. Left ventricular ejection fraction, by estimation, is 20 to 25%. The left ventricle has severely decreased function. The left ventricle demonstrates global hypokinesis. The left ventricular internal cavity size was mildly dilated. There is severe left ventricular hypertrophy. Left ventricular diastolic parameters are consistent with Grade II diastolic dysfunction (pseudonormalization).  2. Right ventricular systolic function is normal. The right ventricular size is normal.  3. Left atrial size was mildly dilated.  4. The mitral valve is normal in structure. Mild mitral valve regurgitation.  5. The aortic valve is normal in structure. Aortic valve regurgitation is trivial. FINDINGS  Left Ventricle: Left ventricular ejection fraction, by estimation, is 20 to  25%. The left ventricle has severely decreased function. The left ventricle demonstrates global hypokinesis. The left ventricular internal cavity size was mildly dilated. There is severe left ventricular hypertrophy. Left ventricular diastolic parameters are consistent with Grade II diastolic dysfunction (pseudonormalization). Right Ventricle: The right ventricular size is normal. No increase in right ventricular wall thickness. Right ventricular systolic function is normal. Left Atrium: Left atrial size was mildly dilated. Right Atrium: Right atrial size was normal in size. Pericardium: There is no evidence of pericardial effusion. Mitral Valve:  The mitral valve is normal in structure. Mild mitral valve regurgitation. Tricuspid Valve: The tricuspid valve is normal in structure. Tricuspid valve regurgitation is mild. Aortic Valve: The aortic valve is normal in structure. Aortic valve regurgitation is trivial. Aortic valve mean gradient measures 3.0 mmHg. Aortic valve peak gradient measures 5.7 mmHg. Aortic valve area, by VTI measures 1.61 cm. Pulmonic Valve: The pulmonic valve was normal in structure. Pulmonic valve regurgitation is mild. Aorta: The aortic root and ascending aorta are structurally normal, with no evidence of dilitation. IAS/Shunts: No atrial level shunt detected by color flow Doppler.  LEFT VENTRICLE PLAX 2D LVIDd:         4.54 cm   Diastology LVIDs:         3.25 cm   LV e' medial:  4.03 cm/s LV PW:         1.42 cm   LV e' lateral: 6.74 cm/s LV IVS:        1.07 cm LVOT diam:     1.70 cm LV SV:         32 LV SV Index:   21 LVOT Area:     2.27 cm  RIGHT VENTRICLE RV Basal diam:  3.77 cm RV S prime:     7.80 cm/s LEFT ATRIUM             Index        RIGHT ATRIUM           Index LA diam:        3.50 cm 2.31 cm/m   RA Area:     14.40 cm LA Vol (A2C):   46.8 ml 30.85 ml/m  RA Volume:   36.50 ml  24.06 ml/m LA Vol (A4C):   40.0 ml 26.37 ml/m LA Biplane Vol: 44.2 ml 29.13 ml/m  AORTIC VALVE                     PULMONIC VALVE AV Area (Vmax):    1.23 cm     PV Vmax:          0.75 m/s AV Area (Vmean):   1.24 cm     PV Vmean:         58.700 cm/s AV Area (VTI):     1.61 cm     PV VTI:           0.154 m AV Vmax:           119.00 cm/s  PV Peak grad:     2.2 mmHg AV Vmean:          77.000 cm/s  PV Mean grad:     1.0 mmHg AV VTI:            0.197 m      PR End Diast Vel: 12.53 msec AV Peak Grad:      5.7 mmHg AV Mean Grad:      3.0 mmHg LVOT Vmax:         64.30 cm/s LVOT Vmean:        42.200 cm/s LVOT VTI:          0.140 m LVOT/AV VTI ratio: 0.71  AORTA Ao Root diam: 3.30 cm  SHUNTS Systemic VTI:  0.14 m Systemic Diam: 1.70 cm Serafina Royals MD Electronically signed by Serafina Royals MD Signature Date/Time: 11/19/2021/1:08:01 PM    Final    DG Chest Portable 1 View  Result Date: 11/18/2021 CLINICAL DATA:  Shortness of breath and respiratory  distress. EXAM: PORTABLE CHEST 1 VIEW COMPARISON:  None Available. FINDINGS: The heart size and mediastinal contours are within normal limits. There is marked severity calcification and tortuosity of the thoracic aorta. Mild, diffuse chronic appearing increased interstitial lung markings are seen. There is no evidence of focal consolidation, pleural effusion or pneumothorax. Prior vertebroplasty is noted within the midthoracic spine. A stable enchondroma is suspected within the proximal left humerus. IMPRESSION: Chronic appearing increased interstitial lung markings without focal consolidation. A mild superimposed component of interstitial edema cannot be excluded. Electronically Signed   By: Virgina Norfolk M.D.   On: 11/18/2021 01:46     Assessment and Recommendation  88 y.o. female with known acute on chronic systolic dysfunction congestive heart failure chronic kidney disease stage III glomerular filtration 29 improved on appropriate medication management needing from medical therapy 1.  Started Jardiance 10 mg each day for further guideline medical therapy and we will  see if patient continues to tolerate 2.  Avoid spironolactone due to previous side effects 3.  Started low-dose Entresto for other possible guideline medical therapy and will continue to see if the patient continues to tolerate 4.  Continue intravenous furosemide for further risk reduction in congestive heart failure and will see if patient is physically able to ambulate 5.  Cardiac rehabilitation 6.  Begin ambulation Problem follow-up for potential improvements of symptoms and possible discharge home and follow-up thereafter and/or to outside facility to depending on family concerns  Signed, Serafina Royals M.D. FACC

## 2021-11-21 NOTE — Plan of Care (Signed)
  Problem: Activity: Goal: Capacity to carry out activities will improve Outcome: Progressing   Problem: Cardiac: Goal: Ability to achieve and maintain adequate cardiopulmonary perfusion will improve Outcome: Progressing   Problem: Education: Goal: Ability to describe self-care measures that may prevent or decrease complications (Diabetes Survival Skills Education) will improve Outcome: Progressing   Problem: Metabolic: Goal: Ability to maintain appropriate glucose levels will improve Outcome: Progressing

## 2021-11-21 NOTE — Progress Notes (Signed)
  Progress Note   [name] DOB: 09-25-1932 DOA: 11/18/2021     3 DOS: the patient was seen and examined on 11/21/2021     Assessment and Plan: * Acute on chronic systolic CHF (congestive heart failure) (HCC) EF 20 to 25% on last echocardiogram.  Restart Jardiance.  Continue IV Lasix and will give an extra dose of Lasix this afternoon.  So far tolerating low-dose Entresto.  Acute on chronic respiratory failure with hypoxia (HCC) Prior to coming in the hospital patient had a pulse ox in the 70s by EMS.  Came to the hospital on CPAP and was switched over to BiPAP and given Lasix.  ER physician documented increased work of breathing and respiratory distress and tachypnea.  Patient chronically wears 4 L of oxygen at home and currently back to 4 L.  Pain in a tooth or teeth Continue clindamycin and will give a short course.  HTN (hypertension) - Continue Coreg, Imdur, Lasix.  Entresto started today.  Hypothyroidism Continue Synthroid   Elevated troponin Demand ischemia from high blood pressure and heart failure  Diabetes ([place]) Restarted Jardiance  CKD (chronic kidney disease), stage III (HCC) CKD stage IIIa  Thrombocytopenia (HCC) Platelet count 94.  Looks chronic.        Subjective: Patient feels better than when she came in with respect to her breathing.  States her breathing is not back to her baseline yet.  Still with a little shortness of breath.  Still with some weakness.  Admitted with acute CHF exacerbation.  Physical Exam: Vitals:   11/21/21 0038 11/21/21 0757 11/21/21 1139 11/21/21 1254  BP:  (!) 156/73 114/65   Pulse: 71 70 73   Resp:  18 17   Temp: 97.6 F (36.4 C) 97.7 F (36.5 C) 97.8 F (36.6 C)   TempSrc: Oral Oral Oral   SpO2: 93% 94% 96%   Weight:    56.9 kg  Height:       Physical Exam HENT:     Head: Normocephalic.     Mouth/Throat:     Pharynx: No oropharyngeal exudate.  Eyes:     General: Lids are normal.      Conjunctiva/sclera: Conjunctivae normal.  Neck:     Comments: Lymph node left neck underneath the jaw. Cardiovascular:     Rate and Rhythm: Normal rate and regular rhythm.     Heart sounds: Normal heart sounds, S1 normal and S2 normal.  Pulmonary:     Breath sounds: Examination of the right-lower field reveals decreased breath sounds and rales. Examination of the left-lower field reveals decreased breath sounds and rales. Decreased breath sounds and rales present. No wheezing or rhonchi.  Abdominal:     Palpations: Abdomen is soft.     Tenderness: There is no abdominal tenderness.  Musculoskeletal:     Right lower leg: No swelling.     Left lower leg: No swelling.  Skin:    General: Skin is warm.     Findings: No rash.  Neurological:     Mental Status: She is alert and oriented to person, place, and time.     Data Reviewed: Creatinine 0.94, hemoglobin 16.4, platelet count 94   Family Communication: Updated patient's daughter on the phone  Disposition: Status is: Inpatient Remains inpatient appropriate because: Being treated with IV Lasix for CHF exacerbation Planned Discharge Destination: Home   Author: Loletha Grayer, MD 11/21/2021 2:03 PM  For on call review [email or web address].

## 2021-11-22 DIAGNOSIS — J9621 Acute and chronic respiratory failure with hypoxia: Secondary | ICD-10-CM | POA: Diagnosis not present

## 2021-11-22 DIAGNOSIS — R531 Weakness: Secondary | ICD-10-CM

## 2021-11-22 DIAGNOSIS — I10 Essential (primary) hypertension: Secondary | ICD-10-CM | POA: Diagnosis not present

## 2021-11-22 DIAGNOSIS — I5023 Acute on chronic systolic (congestive) heart failure: Secondary | ICD-10-CM | POA: Diagnosis not present

## 2021-11-22 DIAGNOSIS — K0889 Other specified disorders of teeth and supporting structures: Secondary | ICD-10-CM | POA: Diagnosis not present

## 2021-11-22 LAB — BASIC METABOLIC PANEL
Anion gap: 7 (ref 5–15)
BUN: 42 mg/dL — ABNORMAL HIGH (ref 8–23)
CO2: 33 mmol/L — ABNORMAL HIGH (ref 22–32)
Calcium: 9 mg/dL (ref 8.9–10.3)
Chloride: 102 mmol/L (ref 98–111)
Creatinine, Ser: 1.08 mg/dL — ABNORMAL HIGH (ref 0.44–1.00)
GFR, Estimated: 50 mL/min — ABNORMAL LOW (ref >60)
Glucose, Bld: 146 mg/dL — ABNORMAL HIGH (ref 70–99)
Potassium: 4.1 mmol/L (ref 3.5–5.1)
Sodium: 142 mmol/L (ref 135–145)

## 2021-11-22 LAB — CULTURE, BLOOD (ROUTINE X 2): Special Requests: ADEQUATE

## 2021-11-22 LAB — GLUCOSE, CAPILLARY
Glucose-Capillary: 139 mg/dL — ABNORMAL HIGH (ref 70–99)
Glucose-Capillary: 250 mg/dL — ABNORMAL HIGH (ref 70–99)
Glucose-Capillary: 133 mg/dL — ABNORMAL HIGH (ref 70–99)
Glucose-Capillary: 109 mg/dL — ABNORMAL HIGH (ref 70–99)

## 2021-11-22 MED ORDER — COVID-19MRNA BIVAL VACC PFIZER 30 MCG/0.3ML IM SUSP
0.3000 mL | Freq: Once | INTRAMUSCULAR | Status: AC
  Administered 2021-11-22: 0.3 mL via INTRAMUSCULAR
  Filled 2021-11-22: qty 0.3

## 2021-11-22 NOTE — Progress Notes (Signed)
Physical Therapy Treatment Patient Details Name: Jacqueline [name] MRN: [id number] DOB: 12/20/1932 Today's Date: 11/22/2021   History of Present Illness Pt is a 88 y.o. female with medical history significant of congestive heart failure, diabetes mellitus type 2, dilated idiopathic cardiomyopathy, GERD, hyperlipidemia, hypertension, hypothyroidism, and renal disorder presents the ED with a chief complaint of shortness of breath and weakness. MD assessment includes Acute on chronic systolic CHF (congestive heart failure) ([place]), Chronic respiratory failure with hypoxia ([place]), Elevated troponin.    PT Comments    Pt was pleasant and motivated to participate during the session and put forth good effort throughout. Pt is able to perform bed mobility minA  with extra time and effort to sit up and primarily needed assist to scoot EOB. Pt was able to perform sit to stand w/ minA to initiate stand and verbal cuing for hand placement and not to pull from walker. Pt was more steady with sidestepping to recliner compared to last session and able to complete w/ RW CGA. SPO2 monitored throughout session with lowest reading at 88 on 4L; pt was cued for pursed lipped breathing and able to recover to mid 90s </=2 min. Pt will benefit from PT services in a SNF setting upon discharge to safely address deficits listed in patient problem list for decreased caregiver assistance and eventual return to PLOF.   Recommendations for follow up therapy are one component of a multi-disciplinary discharge planning process, led by the attending physician.  Recommendations may be updated based on patient status, additional functional criteria and insurance authorization.  Follow Up Recommendations  Skilled nursing-short term rehab (<3 hours/day)     Assistance Recommended at Discharge Intermittent Supervision/Assistance  Patient can return home with the following A little help with walking and/or transfers;A little help with  bathing/dressing/bathroom;Assistance with cooking/housework;Help with stairs or ramp for entrance   Equipment Recommendations  Rolling walker (2 wheels);BSC/3in1    Recommendations for Other Services       Precautions / Restrictions Precautions Precautions: Fall Restrictions Weight Bearing Restrictions: No     Mobility  Bed Mobility Overal bed mobility: Needs Assistance Bed Mobility: Supine to Sit     Supine to sit: Min assist     General bed mobility comments: verbal cuing for hand placement and minA to scoot EOB    Transfers Overall transfer level: Needs assistance Equipment used: Rolling walker (2 wheels) Transfers: Sit to/from Stand Sit to Stand: Min assist           General transfer comment: minA to initiate stand    Ambulation/Gait Ambulation/Gait assistance: Min guard Gait Distance (Feet): 3 Feet Assistive device: Rolling walker (2 wheels) Gait Pattern/deviations: Step-to pattern, Decreased step length - right, Decreased step length - left       General Gait Details: pt able to sidestep to recliner w/ min foot clearance and cuing to keep RW close   Stairs             Wheelchair Mobility    Modified Rankin (Stroke Patients Only)       Balance Overall balance assessment: Needs assistance Sitting-balance support: Bilateral upper extremity supported, Feet unsupported Sitting balance-Leahy Scale: Good     Standing balance support: Reliant on assistive device for balance, During functional activity Standing balance-Leahy Scale: Fair                              Cognition Arousal/Alertness: Awake/alert Behavior During Therapy: WFL for  tasks assessed/performed Overall Cognitive Status: Within Functional Limits for tasks assessed                                          Exercises General Exercises - Lower Extremity Ankle Circles/Pumps: Strengthening, Both, 10 reps Quad Sets: Strengthening, Both, 10  reps Long Arc Quad: Strengthening, Both, 10 reps Hip Flexion/Marching: Seated, Strengthening, Both, 10 reps    General Comments        Pertinent Vitals/Pain Pain Assessment Pain Assessment: 0-10 Pain Score: 0-No pain    Home Living Family/patient expects to be discharged to:: Private residence Living Arrangements: Alone Available Help at Discharge: Family;Personal care attendant;Available PRN/intermittently Type of Home: House Home Access: Ramped entrance;Stairs to enter   Entrance Stairs-Number of Steps: 1   Home Layout: One level Home Equipment: Rollator (4 wheels);Grab bars - tub/shower;Shower seat      Prior Function            PT Goals (current goals can now be found in the care plan section) Progress towards PT goals: Progressing toward goals    Frequency    Min 2X/week      PT Plan Current plan remains appropriate    Co-evaluation              AM-PAC PT "6 Clicks" Mobility   Outcome Measure  Help needed turning from your back to your side while in a flat bed without using bedrails?: A Little Help needed moving from lying on your back to sitting on the side of a flat bed without using bedrails?: A Little Help needed moving to and from a bed to a chair (including a wheelchair)?: A Little Help needed standing up from a chair using your arms (e.g., wheelchair or bedside chair)?: A Little Help needed to walk in hospital room?: A Lot Help needed climbing 3-5 steps with a railing? : A Lot 6 Click Score: 16    End of Session Equipment Utilized During Treatment: Gait belt Activity Tolerance: Patient tolerated treatment well;Patient limited by fatigue Patient left: in chair;with call bell/phone within reach;with chair alarm set;with family/visitor present Nurse Communication: Mobility status PT Visit Diagnosis: Difficulty in walking, not elsewhere classified (R26.2);Muscle weakness (generalized) (M62.81);Unsteadiness on feet (R26.81)     Time:  9371-6967 PT Time Calculation (min) (ACUTE ONLY): 27 min  Charges:                        Turner Daniels, SPT  11/22/2021, 3:31 PM

## 2021-11-22 NOTE — Assessment & Plan Note (Signed)
Physical therapy recommending rehab 

## 2021-11-22 NOTE — Progress Notes (Signed)
Progress Note   [name] QAS:[id number] DOB: 1933/01/03 DOA: 11/18/2021     4 DOS: the patient was seen and examined on 11/22/2021     Assessment and Plan: * Acute on chronic systolic CHF (congestive heart failure) (HCC) EF 20 to 25% on last echocardiogram.  Restart Jardiance.  Continue IV Lasix while here.  So far tolerating low-dose Entresto.  Acute on chronic respiratory failure with hypoxia (HCC) Prior to coming in the hospital patient had a pulse ox in the 70s by EMS.  Came to the hospital on CPAP and was switched over to BiPAP and given Lasix.  ER physician documented increased work of breathing and respiratory distress and tachypnea.  Patient chronically wears 4 L of oxygen at home and currently back to 4 L.  Patient desaturated into the 80s with working with OT today.  Pain in a tooth or teeth Continue clindamycin and will give a short course.  HTN (hypertension) - Continue Coreg, Imdur, Lasix and Entresto  Hypothyroidism Continue Synthroid   Elevated troponin Demand ischemia from high blood pressure and heart failure  Diabetes ([place]) Continue Jardiance  Weakness Physical therapy recommending rehab.  CKD (chronic kidney disease), stage III (HCC) CKD stage IIIa  Thrombocytopenia (HCC) Chronic thrombocytopenia.        Subjective: Patient this morning says she was short of breath.  Does not feel like her breathing is back to normal.  With working with occupational therapy did drop her saturations down into the 80s.  Physical Exam: Vitals:   11/22/21 0500 11/22/21 0512 11/22/21 0804 11/22/21 1212  BP:  (!) 159/71 (!) 159/74 (!) 111/55  Pulse:  73 74 75  Resp:  '18 16 17  '$ Temp:  97.8 F (36.6 C) 98.3 F (36.8 C) 98.4 F (36.9 C)  TempSrc:   Oral   SpO2:  94% 94% 95%  Weight: 57.1 kg     Height:       Physical Exam HENT:     Head: Normocephalic.     Mouth/Throat:     Pharynx: No oropharyngeal exudate.  Eyes:     General: Lids are normal.      Conjunctiva/sclera: Conjunctivae normal.  Neck:     Comments: Lymph node left neck underneath the jaw. Cardiovascular:     Rate and Rhythm: Normal rate and regular rhythm.     Heart sounds: Normal heart sounds, S1 normal and S2 normal.  Pulmonary:     Breath sounds: Examination of the right-lower field reveals decreased breath sounds and rales. Examination of the left-lower field reveals decreased breath sounds and rales. Decreased breath sounds and rales present. No wheezing or rhonchi.  Abdominal:     Palpations: Abdomen is soft.     Tenderness: There is no abdominal tenderness.  Musculoskeletal:     Right lower leg: No swelling.     Left lower leg: No swelling.  Skin:    General: Skin is warm.     Findings: No rash.  Neurological:     Mental Status: She is alert and oriented to person, place, and time.     Data Reviewed: Creatinine up to 1.08 today  Family Communication: Spoke with daughter at the bedside  Disposition: Status is: Inpatient Remains inpatient appropriate because: Physical therapy now recommending rehab.  Patient will go out to rehab on Monday.  COVID booster required by the rehab.  Continue IV diuresis today since desaturated with working with OT today. Planned Discharge Destination: Home  Case discussed with  cardiology. Author: Loletha Grayer, MD 11/22/2021 3:21 PM  For on call review [email or web address].

## 2021-11-22 NOTE — Progress Notes (Signed)
[name]       Patient ID: Jacqueline [name] MRN: [id number] DOB/AGE: 86-May-1934 86 y.o.  Admit date: 11/18/2021 Referring Physician Dr. Karie Kirks Primary Physician Dr. Ginette Pitman Primary Cardiologist Nehemiah Massed Reason for Consultation HFrEF  HPI: Jacqueline [name] is an 88yoF with a PMH of NICM / HFrEF (LVEF 20-25%, G2 2 DD 11/18/2021), chronic respiratory failure on 4 L at baseline, hypertension, hyperlipidemia, CKD 3, type 2 diabetes, hypothyroidism who [facility] ED 11/18/2021 with shortness of breath and weakness.  BNP was elevated to 1600, and she was diuresed with 40 mg IV Lasix daily was back to her baseline oxygen requirement of 4 L.  Interval history: -feels better, no chest pain or SOB -eager to go home   Review of systems complete and found to be negative unless listed above     Past Medical History:  Diagnosis Date   Anemia    CHF (congestive heart failure) ([place])    Diabetes mellitus without complication ([place]) 31/51/7616   Currently no high BS.  Has come off meds.  But having episodes of low BS now.   Dilated idiopathic cardiomyopathy (HCC)    GERD (gastroesophageal reflux disease)    Hyperlipidemia    Hypertension    Hypothyroidism    Osteoporosis    Renal disorder    stage 3    Past Surgical History:  Procedure Laterality Date   BUNIONECTOMY     COLONOSCOPY     ESOPHAGOGASTRODUODENOSCOPY (EGD) WITH PROPOFOL N/A 10/15/2017   Procedure: ESOPHAGOGASTRODUODENOSCOPY (EGD) WITH PROPOFOL;  Surgeon: Lollie Sails, MD;  Location: [facility] ENDOSCOPY;  Service: Endoscopy;  Laterality: N/A;   KYPHOPLASTY N/A 06/12/2017   Procedure: [id number];  Surgeon: Hessie Knows, MD;  Location: [facility] ORS;  Service: Orthopedics;  Laterality: N/A;   TONSILLECTOMY      Medications Prior to Admission  Medication Sig Dispense Refill Last Dose   alendronate (FOSAMAX) 70 MG tablet Take 70 mg by mouth once a week. Take with a full glass of water on an empty  stomach.   Past Week   aspirin EC 81 MG tablet Take 81 mg by mouth daily.   11/17/2021   carvedilol (COREG) 12.5 MG tablet Take by mouth 2 (two) times daily with a meal.    11/17/2021   ergocalciferol (VITAMIN D2) 50000 units capsule Take 50,000 Units by mouth once a week.   Past Week   iron polysaccharides (NIFEREX) 150 MG capsule Take 150 mg by mouth daily.   11/17/2021   isosorbide mononitrate (IMDUR) 60 MG 24 hr tablet Take 0.5 tablets (30 mg total) by mouth daily. 30 tablet 1 11/17/2021   KLOR-CON M10 10 MEQ tablet Take 10 mEq by mouth daily.   11/17/2021   levothyroxine (SYNTHROID, LEVOTHROID) 25 MCG tablet Take 25 mcg by mouth daily before breakfast.   11/17/2021   pantoprazole (PROTONIX) 40 MG tablet Take 40 mg by mouth daily.   11/17/2021   sertraline (ZOLOFT) 50 MG tablet Take 50 mg by mouth daily.   11/17/2021   torsemide (DEMADEX) 10 MG tablet Take 1 tablet (10 mg total) by mouth daily. 30 tablet 0 11/17/2021   glucose blood test strip USE TO CHECK BLOOD SUGARS TWICE A DAY      JARDIANCE 10 MG TABS tablet Take 10 mg by mouth daily.      Social History   Socioeconomic History   Marital status: Widowed    Spouse name: Not on file   Number of children:  Not on file   Years of education: Not on file   Highest education level: Not on file  Occupational History   Not on file  Tobacco Use   Smoking status: Former    Types: Cigarettes    Quit date: 04/18/1984    Years since quitting: 37.6   Smokeless tobacco: Never  Vaping Use   Vaping Use: Former   Start date: 05/16/1965   Quit date: 05/16/1989  Substance and Sexual Activity   Alcohol use: No   Drug use: No   Sexual activity: Never  Other Topics Concern   Not on file  Social History Narrative   Not on file   Social Determinants of Health   Financial Resource Strain: Not on file  Food Insecurity: Not on file  Transportation Needs: Not on file  Physical Activity: Not on file  Stress: Not on file  Social Connections: Not on file   Intimate Partner Violence: Not on file    Family History  Problem Relation Age of Onset   Premature CHD Brother       PHYSICAL EXAM General: Pleasant elderly asian female , well nourished, in no acute distress. HEENT:  Normocephalic and atraumatic. Neck:  No JVD.  Lungs: Normal respiratory effort on baselin 4L by [place]. Decreased breath sounds in bases  Heart: HRRR . Normal S1 and S2 without gallops or murmurs. Radial & DP pulses 2+ bilaterally. Abdomen: Non-distended appearing.  Msk: Normal strength and tone for age. Extremities: Warm and well perfused. No clubbing, cyanosis. Trace LLE edema.  Neuro: Alert and oriented X 3. Psych:  Answers questions appropriately.   Labs:   Lab Results  Component Value Date   WBC 4.8 11/20/2021   HGB 16.4 (H) 11/20/2021   HCT 52.5 (H) 11/20/2021   MCV 93.1 11/20/2021   PLT 94 (L) 11/20/2021    Recent Labs  Lab 11/18/21 1459 11/18/21 2112 11/22/21 0753  NA 142   < > 142  K 3.7   < > 4.1  CL 101   < > 102  CO2 31   < > 33*  BUN QUANTITY NOT SUFFICIENT, UNABLE TO PERFORM TEST   < > 42*  CREATININE QUANTITY NOT SUFFICIENT, UNABLE TO PERFORM TEST   < > 1.08*  CALCIUM 8.8*   < > 9.0  PROT 7.7  --   --   BILITOT 1.1  --   --   ALKPHOS 71  --   --   ALT 24  --   --   AST 29  --   --   GLUCOSE 132*   < > 146*   < > = values in this interval not displayed.   Lab Results  Component Value Date   CKTOTAL 187 02/19/2013   CKMB 1.6 02/19/2013   TROPONINI 0.04 (HH) 10/10/2017   No results found for: "CHOL" No results found for: "HDL" No results found for: "LDLCALC" No results found for: "TRIG" No results found for: "CHOLHDL" No results found for: "LDLDIRECT"    Radiology: ECHOCARDIOGRAM COMPLETE  Result Date: 11/19/2021    ECHOCARDIOGRAM REPORT   Patient Name:   Jacqueline [name] Date of Exam: 11/18/2021 Medical Rec #:  [id number]     Height:       57.0 in Accession #:    [id number]    Weight:       134.0 lb Date of Birth:  09-20-1932     BSA:  1.517 m Patient Age:    88 years      BP:           183/100 mmHg Patient Gender: F             HR:           78 bpm. Exam Location:  [facility] Procedure: 2D Echo, Color Doppler and Cardiac Doppler Indications:     I50.9 Congestive heart failure  History:         Patient has prior history of Echocardiogram examinations. CHF                  and Cardiomyopathy; Risk Factors:Former Smoker, Diabetes,                  Hypertension and Dyslipidemia.  Sonographer:     Rosalia Hammers Referring Phys:  [id number] ASIA B [place] Diagnosing Phys: Serafina Royals MD  Sonographer Comments: Technically difficult study due to poor echo windows. Image acquisition challenging due to respiratory motion. IMPRESSIONS  1. Left ventricular ejection fraction, by estimation, is 20 to 25%. The left ventricle has severely decreased function. The left ventricle demonstrates global hypokinesis. The left ventricular internal cavity size was mildly dilated. There is severe left ventricular hypertrophy. Left ventricular diastolic parameters are consistent with Grade II diastolic dysfunction (pseudonormalization).  2. Right ventricular systolic function is normal. The right ventricular size is normal.  3. Left atrial size was mildly dilated.  4. The mitral valve is normal in structure. Mild mitral valve regurgitation.  5. The aortic valve is normal in structure. Aortic valve regurgitation is trivial. FINDINGS  Left Ventricle: Left ventricular ejection fraction, by estimation, is 20 to 25%. The left ventricle has severely decreased function. The left ventricle demonstrates global hypokinesis. The left ventricular internal cavity size was mildly dilated. There is severe left ventricular hypertrophy. Left ventricular diastolic parameters are consistent with Grade II diastolic dysfunction (pseudonormalization). Right Ventricle: The right ventricular size is normal. No increase in right ventricular wall thickness. Right ventricular systolic function  is normal. Left Atrium: Left atrial size was mildly dilated. Right Atrium: Right atrial size was normal in size. Pericardium: There is no evidence of pericardial effusion. Mitral Valve: The mitral valve is normal in structure. Mild mitral valve regurgitation. Tricuspid Valve: The tricuspid valve is normal in structure. Tricuspid valve regurgitation is mild. Aortic Valve: The aortic valve is normal in structure. Aortic valve regurgitation is trivial. Aortic valve mean gradient measures 3.0 mmHg. Aortic valve peak gradient measures 5.7 mmHg. Aortic valve area, by VTI measures 1.61 cm. Pulmonic Valve: The pulmonic valve was normal in structure. Pulmonic valve regurgitation is mild. Aorta: The aortic root and ascending aorta are structurally normal, with no evidence of dilitation. IAS/Shunts: No atrial level shunt detected by color flow Doppler.  LEFT VENTRICLE PLAX 2D LVIDd:         4.54 cm   Diastology LVIDs:         3.25 cm   LV e' medial:  4.03 cm/s LV PW:         1.42 cm   LV e' lateral: 6.74 cm/s LV IVS:        1.07 cm LVOT diam:     1.70 cm LV SV:         32 LV SV Index:   21 LVOT Area:     2.27 cm  RIGHT VENTRICLE RV Basal diam:  3.77 cm RV S prime:     7.80 cm/s LEFT  ATRIUM             Index        RIGHT ATRIUM           Index LA diam:        3.50 cm 2.31 cm/m   RA Area:     14.40 cm LA Vol (A2C):   46.8 ml 30.85 ml/m  RA Volume:   36.50 ml  24.06 ml/m LA Vol (A4C):   40.0 ml 26.37 ml/m LA Biplane Vol: 44.2 ml 29.13 ml/m  AORTIC VALVE                    PULMONIC VALVE AV Area (Vmax):    1.23 cm     PV Vmax:          0.75 m/s AV Area (Vmean):   1.24 cm     PV Vmean:         58.700 cm/s AV Area (VTI):     1.61 cm     PV VTI:           0.154 m AV Vmax:           119.00 cm/s  PV Peak grad:     2.2 mmHg AV Vmean:          77.000 cm/s  PV Mean grad:     1.0 mmHg AV VTI:            0.197 m      PR End Diast Vel: 12.53 msec AV Peak Grad:      5.7 mmHg AV Mean Grad:      3.0 mmHg LVOT Vmax:         64.30 cm/s  LVOT Vmean:        42.200 cm/s LVOT VTI:          0.140 m LVOT/AV VTI ratio: 0.71  AORTA Ao Root diam: 3.30 cm  SHUNTS Systemic VTI:  0.14 m Systemic Diam: 1.70 cm Serafina Royals MD Electronically signed by Serafina Royals MD Signature Date/Time: 11/19/2021/1:08:01 PM    Final    DG Chest Portable 1 View  Result Date: 11/18/2021 CLINICAL DATA:  Shortness of breath and respiratory distress. EXAM: PORTABLE CHEST 1 VIEW COMPARISON:  None Available. FINDINGS: The heart size and mediastinal contours are within normal limits. There is marked severity calcification and tortuosity of the thoracic aorta. Mild, diffuse chronic appearing increased interstitial lung markings are seen. There is no evidence of focal consolidation, pleural effusion or pneumothorax. Prior vertebroplasty is noted within the midthoracic spine. A stable enchondroma is suspected within the proximal left humerus. IMPRESSION: Chronic appearing increased interstitial lung markings without focal consolidation. A mild superimposed component of interstitial edema cannot be excluded. Electronically Signed   By: Virgina Norfolk M.D.   On: 11/18/2021 01:46    ECHO LVEF 20-25%, G2 DD  TELEMETRY reviewed by me: sinus rhythm 70s  ASSESSMENT AND PLAN:  Jacqueline [name] is an 44yoF with a PMH of NICM / HFrEF (LVEF 20-25%, G2 2 DD 11/18/2021), chronic respiratory failure on 4 L at baseline, hypertension, hyperlipidemia, CKD 3, type 2 diabetes, hypothyroidism who [facility] to [facility] ED 11/18/2021 with shortness of breath and weakness.  BNP was elevated to 1600, and she was diuresed with 40 mg IV Lasix daily was back to her baseline oxygen requirement of 4 L.  #Acute on chronic HFrEF -She is back to her baseline oxygen requirement on hospital day 4, was diuresed okay, and taking  lots of p.o. fluids. -Continue GDMT with Coreg 12.5 mg twice daily, Jardiance 10 mg once daily, Entresto 24-26 mg twice daily,  -resume home torsemide '15mg'$  daily at discharge  -Continue  aspirin and isosorbide -ok for discharge today with close follow up with Dr. Nehemiah Massed in 1 week   This patient's plan of care was discussed and created with Dr. Nehemiah Massed and he is in agreement.  Signed: Tristan Schroeder , PA-C 11/22/2021, 9:52 AM [facility]

## 2021-11-22 NOTE — Progress Notes (Signed)
Nutrition Brief Note  Patient identified on the [place] Report  Wt Readings from Last 15 Encounters:  11/22/21 57.1 kg  02/11/19 60.8 kg  01/08/19 62.2 kg  08/04/18 61.2 kg  01/14/18 57 kg  10/15/17 54.2 kg  10/10/17 54.4 kg  10/08/17 54.4 kg  06/22/17 60.8 kg  06/15/17 59.4 kg  06/05/17 58.1 kg  06/04/17 58.1 kg  03/16/17 57.6 kg  03/05/17 56.2 kg  02/25/17 56.7 kg   Pt with medical history significant of congestive heart failure, diabetes mellitus type 2, dilated idiopathic cardiomyopathy, GERD, hyperlipidemia, hypertension, hypothyroidism, and renal disorder presents with a chief complaint of shortness of breath.   Pt admitted with CHF exacerbation.   Plan to discharge to SNF on Monday.   Lab Results  Component Value Date   HGBA1C 6.5 (H) 11/18/2021   PTA DM medications are none.   Labs reviewed: CBGS: 139-250 (inpatient orders for glycemic control are 0-15 units insulin aspart TID with meals and 0-5 units insulin aspart daily at bedtime).    Current diet order is heart healthy/ carb modified, patient is consuming approximately 50-100% of meals at this time. Labs and medications reviewed.   No nutrition interventions warranted at this time. If nutrition issues arise, please consult RD.   Loistine Chance, RD, LDN, [place] Registered Dietitian II Certified Diabetes Care and Education Specialist Please refer to [facility] for RD and/or RD on-call/weekend/after hours pager

## 2021-11-22 NOTE — NC FL2 (Signed)
[place] LEVEL OF CARE SCREENING TOOL     IDENTIFICATION  Patient Name: Jacqueline [name] Birthdate: June 10, 1933 Sex: female Admission Date (Current Location): 11/18/2021  [facility] and [place] Number:  Engineering geologist and Address:  [facility], [address], [place], [place] [address]      Provider Number: [id number]  Attending Physician Name and Address:  Loletha Grayer, MD  Relative Name and Phone Number:  [name] (daughter) [phone]    Current Level of Care: Hospital Recommended Level of Care: [place] Prior Approval Number:    Date Approved/Denied:   PASRR Number: [id number]  Discharge Plan: SNF    Current Diagnoses: Patient Active Problem List   Diagnosis Date Noted   CKD (chronic kidney disease), stage III ([place]) 11/21/2021   Pain in a tooth or teeth 11/20/2021   Acute on chronic respiratory failure with hypoxia (HCC)    Elevated troponin 11/18/2021   Acute exacerbation of CHF (congestive heart failure) ([place]) 11/18/2021   Thrombocytopenia ([place]) 02/11/2019   Chest pain 01/05/2019   Hyponatremia 06/09/2017   MI, acute, non ST segment elevation ([place]) 06/01/2017   Acute upper back pain 03/05/2017   Carcinoma of unknown primary ([place]) 03/05/2017   Liver lesion 02/27/2017   Diabetes ([place]) 02/24/2017   Acute on chronic systolic CHF (congestive heart failure) ([place]) 02/24/2017   HTN (hypertension) 02/24/2017   GERD (gastroesophageal reflux disease) 02/24/2017   Hypothyroidism 02/24/2017   Hypoglycemia 02/24/2017   Dizziness 02/18/2017    Orientation RESPIRATION BLADDER Height & Weight     Self, Time, Situation, Place  O2 (4L nasal cannula) Continent, External catheter Weight: 125 lb 14.1 oz (57.1 kg) Height:  '4\' 9"'$  (144.8 cm)  BEHAVIORAL SYMPTOMS/MOOD NEUROLOGICAL BOWEL NUTRITION STATUS      Continent Diet (see discharge summary)  AMBULATORY STATUS COMMUNICATION OF NEEDS Skin   Limited Assist Verbally  Normal                       Personal Care Assistance Level of Assistance  Bathing, Feeding, Dressing, Total care Bathing Assistance: Limited assistance Feeding assistance: Independent Dressing Assistance: Limited assistance Total Care Assistance: Limited assistance   Functional Limitations Info  Sight, Speech, Hearing Sight Info: Adequate Hearing Info: Adequate Speech Info: Adequate    SPECIAL CARE FACTORS FREQUENCY  PT (By licensed PT), OT (By licensed OT)     PT Frequency: min 4x weekly OT Frequency: min 4x weekly            Contractures Contractures Info: Not present    Additional Factors Info  Code Status, Allergies Code Status Info: dnr Allergies Info: Ciprofloxacin   Digoxin And Related   Erythromycin   Lopid (Gemfibrozil)   Nitrofurantoin   Spironolactone   Cefuroxime   Penicillins           Current Medications (11/22/2021):  This is the current hospital active medication list Current Facility-Administered Medications  Medication Dose Route Frequency Provider Last Rate Last Admin   acetaminophen (TYLENOL) tablet 650 mg  650 mg Oral Q6H PRN Zierle-Ghosh, Asia B, DO       Or   acetaminophen (TYLENOL) suppository 650 mg  650 mg Rectal Q6H PRN Zierle-Ghosh, Asia B, DO       aspirin EC tablet 81 mg  81 mg Oral Daily Zierle-Ghosh, Asia B, DO   81 mg at 11/22/21 0839   carvedilol (COREG) tablet 12.5 mg  12.5 mg Oral BID WC Zierle-Ghosh, [place]  B, DO   12.5 mg at 11/22/21 0839   clindamycin (CLEOCIN) capsule 300 mg  300 mg Oral Q8H Darrick Penna, RPH   300 mg at 11/22/21 6294   empagliflozin (JARDIANCE) tablet 10 mg  10 mg Oral Daily Loletha Grayer, MD   10 mg at 11/22/21 0840   furosemide (LASIX) injection 40 mg  40 mg Intravenous Daily Zierle-Ghosh, Asia B, DO   40 mg at 11/22/21 0841   heparin injection 5,000 Units  5,000 Units Subcutaneous Q8H Zierle-Ghosh, Asia B, DO   5,000 Units at 11/22/21 0658   insulin aspart (novoLOG) injection 0-15 Units  0-15  Units Subcutaneous TID WC Zierle-Ghosh, Asia B, DO   2 Units at 11/22/21 0840   insulin aspart (novoLOG) injection 0-5 Units  0-5 Units Subcutaneous QHS Zierle-Ghosh, Asia B, DO       isosorbide mononitrate (IMDUR) 24 hr tablet 30 mg  30 mg Oral Daily Zierle-Ghosh, Asia B, DO   30 mg at 11/22/21 0840   levothyroxine (SYNTHROID) tablet 25 mcg  25 mcg Oral QAC breakfast Zierle-Ghosh, Asia B, DO   25 mcg at 11/22/21 0658   ondansetron (ZOFRAN) tablet 4 mg  4 mg Oral Q6H PRN Zierle-Ghosh, Asia B, DO       Or   ondansetron (ZOFRAN) injection 4 mg  4 mg Intravenous Q6H PRN Zierle-Ghosh, Asia B, DO       oxyCODONE (Oxy IR/ROXICODONE) immediate release tablet 5 mg  5 mg Oral Q4H PRN Zierle-Ghosh, Asia B, DO   5 mg at 11/20/21 2103   pantoprazole (PROTONIX) EC tablet 40 mg  40 mg Oral Daily Zierle-Ghosh, Asia B, DO   40 mg at 11/22/21 0840   sacubitril-valsartan (ENTRESTO) 24-26 mg per tablet  1 tablet Oral BID Corey Skains, MD   1 tablet at 11/22/21 0840   sertraline (ZOLOFT) tablet 50 mg  50 mg Oral Daily Zierle-Ghosh, Asia B, DO   50 mg at 11/22/21 0840     Discharge Medications: Please see discharge summary for a list of discharge medications.  Relevant Imaging Results:  Relevant Lab Results:   Additional Information TML:[id number]  Alberteen Sam, LCSW

## 2021-11-22 NOTE — Evaluation (Signed)
Occupational Therapy Evaluation Patient Details Name: Jacqueline [name] MRN: [id number] DOB: 12-30-1932 Today's Date: 11/22/2021   History of Present Illness Pt is a 88 y.o. female with medical history significant of congestive heart failure, diabetes mellitus type 2, dilated idiopathic cardiomyopathy, GERD, hyperlipidemia, hypertension, hypothyroidism, and renal disorder presents the ED with a chief complaint of shortness of breath and weakness. MD assessment includes Acute on chronic systolic CHF (congestive heart failure) ([place]), Chronic respiratory failure with hypoxia ([place]), Elevated troponin.   Clinical Impression   Pt. Presents with  weakness, limited activity tolerance, and limited functional mobility which limits her ability to complete basic ADL and IADL functioning. Pt.'s daughter and personal care aide assist the pt. with morning ADL care, shower transfers, meal preparation, home management, and transportation. Pt. Was responsible for taking her medications which come from the pharmacy in individual packs. Pt. enjoys watching TV, and playing [place].  Pt.'s SpO2 dropped to 82% in standing. With cues for PLB techniques, and increased time in sitting the SpO2 increased to 92% on 4LO2. Pt. Requires MinA. and ModA LE ADL seated, MaxA ADL care in standing. Pt. Could benefit from OT services for ADL training, A/E training, and pt. Education about home modification, and DME. Pt. would benefit from SNF level of care upon discharge, with follow-up OT services.      Recommendations for follow up therapy are one component of a multi-disciplinary discharge planning process, led by the attending physician.  Recommendations may be updated based on patient status, additional functional criteria and insurance authorization.   Follow Up Recommendations  Skilled nursing-short term rehab (<3 hours/day)    Assistance Recommended at Discharge    Patient can return home with the following Assistance with  cooking/housework;A lot of help with bathing/dressing/bathroom    Functional Status Assessment  Patient has had a recent decline in their functional status and demonstrates the ability to make significant improvements in function in a reasonable and predictable amount of time.  Equipment Recommendations       Recommendations for Other Services       Precautions / Restrictions Precautions Precautions: Fall Restrictions Weight Bearing Restrictions: No      Mobility Bed Mobility   Bed Mobility: Supine to Sit     Supine to sit: Mod assist, Min assist     General bed mobility comments: Increased assist needed to push up into stting position    Transfers Overall transfer level: Needs assistance Equipment used: Rolling walker (2 wheels) Transfers: Sit to/from Stand Sit to Stand: Min assist           General transfer comment: Pt. leans heavily on the walker with increased time for uprigth posture. SPO2 82% in standing.      Balance Overall balance assessment: Needs assistance Sitting-balance support: Bilateral upper extremity supported, Feet supported Sitting balance-Leahy Scale: Fair     Standing balance support: Reliant on assistive device for balance, During functional activity Standing balance-Leahy Scale: Fair                             ADL either performed or assessed with clinical judgement   ADL Overall ADL's : Needs assistance/impaired                 Upper Body Dressing : Minimal assistance   Lower Body Dressing: Moderate assistance  Vision Patient Visual Report: No change from baseline       Perception     Praxis      Pertinent Vitals/Pain Pain Assessment Pain Assessment: 0-10 Pain Score:  (Gas pain, not rated)     Hand Dominance     Extremity/Trunk Assessment Upper Extremity Assessment Upper Extremity Assessment: Generalized weakness           Communication  Communication Communication: HOH   Cognition Arousal/Alertness: Awake/alert Behavior During Therapy: WFL for tasks assessed/performed Overall Cognitive Status: Within Functional Limits for tasks assessed                                       General Comments       Exercises     Shoulder Instructions      Home Living Family/patient expects to be discharged to:: Private residence Living Arrangements: Alone Available Help at Discharge: Family;Personal care attendant;Available PRN/intermittently Type of Home: House Home Access: Ramped entrance;Stairs to enter Entrance Stairs-Number of Steps: 1   Home Layout: One level     Bathroom Shower/Tub: Occupational psychologist: Standard Bathroom Accessibility: Yes   Home Equipment: Rollator (4 wheels);Grab bars - tub/shower;Shower seat          Prior Functioning/Environment Prior Level of Function : Independent/Modified Independent             Mobility Comments: Independent household ambulator using rollator ADLs Comments: Pt.'s Daugher and aide assist with morning ADLs, shower transfers, meal preparation,home management, transportation. Pt. is responsible for taking her medications which are in bubble packs wiuth times specified on the packs.        OT Problem List: Decreased strength;Decreased activity tolerance;Pain;Decreased safety awareness;Decreased knowledge of use of DME or AE      OT Treatment/Interventions: Self-care/ADL training;DME and/or AE instruction;Therapeutic exercise;Patient/family education;Energy conservation;Therapeutic activities    OT Goals(Current goals can be found in the care plan section) Acute Rehab OT Goals Patient Stated Goal: To get better OT Goal Formulation: With patient Time For Goal Achievement: 12/13/21 Potential to Achieve Goals: Good  OT Frequency: Min 2X/week    Co-evaluation              AM-PAC OT "6 Clicks" Daily Activity     Outcome Measure  Help from another person eating meals?: A Little Help from another person taking care of personal grooming?: A Little Help from another person toileting, which includes using toliet, bedpan, or urinal?: A Lot Help from another person bathing (including washing, rinsing, drying)?: A Lot Help from another person to put on and taking off regular upper body clothing?: A Little Help from another person to put on and taking off regular lower body clothing?: A Lot 6 Click Score: 15   End of Session Equipment Utilized During Treatment: Gait belt  Activity Tolerance: Patient tolerated treatment well Patient left: in bed;with call bell/phone within reach;with bed alarm set  OT Visit Diagnosis: Muscle weakness (generalized) (M62.81)                Time: 1100-1136 OT Time Calculation (min): 36 min Charges:  OT General Charges $OT Visit: 1 Visit OT Evaluation $OT Eval High Complexity: 1 High  Harrel Carina, MS, OTR/L   Harrel Carina 11/22/2021, 12:28 PM

## 2021-11-22 NOTE — TOC Initial Note (Addendum)
Transition of Care [facility]) - Initial/Assessment Note    Patient Details  Name: Jacqueline [name] MRN: [id number] Date of Birth: [birth year]  Transition of [facility]) CM/SW Contact:    Alberteen Sam, LCSW Phone Number: 11/22/2021, 11:50 AM  Clinical Narrative:                  Update: [facility] able to accept patient Monday contingent on covid booster, daughter aware and agreeable, MD has put in orders for booster to be given to patient.   Patient and daughter at bedside agreeable to SNF with preference for [facility] (no to [facility]).   CSW has sent referrals pending bed offers.   Expected Discharge Plan: Skilled Nursing Facility Barriers to Discharge: Continued Medical Work up   Patient Goals and CMS Choice Patient states their goals for this hospitalization and ongoing recovery are:: to go home CMS Medicare.gov Compare Post Acute Care list provided to:: Patient Choice offered to / list presented to : Patient  Expected Discharge Plan and Services Expected Discharge Plan: [place] In-house Referral: Clinical Social Work   Post Acute Care Choice: NA Living arrangements for the past 2 months: [place]                                      Prior Living Arrangements/Services Living arrangements for the past 2 months: Single Family Home Lives with:: Self Patient language and need for interpreter reviewed:: Yes Do you feel safe going back to the place where you live?: Yes      Need for Family Participation in Patient Care: Yes (Comment) Care giver support system in place?: Yes (comment)   Criminal Activity/Legal Involvement Pertinent to Current Situation/Hospitalization: No - Comment as needed  Activities of Daily Living Home Assistive Devices/Equipment: Walker (specify type) ADL Screening (condition at time of admission) Patient's cognitive ability adequate to safely complete daily activities?: Yes Is the patient deaf or have difficulty hearing?:  No Does the patient have difficulty seeing, even when wearing glasses/contacts?: No Does the patient have difficulty concentrating, remembering, or making decisions?: No Patient able to express need for assistance with ADLs?: Yes Does the patient have difficulty dressing or bathing?: No Independently performs ADLs?: No Communication: Independent Dressing (OT): Independent Grooming: Independent Feeding: Independent Bathing: Needs assistance Is this a change from baseline?: Change from baseline, expected to last <3 days Toileting: Needs assistance Is this a change from baseline?: Change from baseline, expected to last <3 days In/Out Bed: Needs assistance Is this a change from baseline?: Change from baseline, expected to last <3 days Walks in Home: Needs assistance Is this a change from baseline?: Change from baseline, expected to last <3 days Does the patient have difficulty walking or climbing stairs?: Yes Weakness of Legs: Both Weakness of Arms/Hands: None  Permission Sought/Granted Permission sought to share information with : Family Supports    Share Information with NAME: joann     Permission granted to share info w Relationship: daughter     Emotional Assessment Appearance:: Appears stated age     Orientation: : Oriented to Self, Oriented to Place, Oriented to  Time Alcohol / Substance Use: Not Applicable Psych Involvement: No (comment)  Admission diagnosis:  Demand ischemia ([place]) [I24.8] Acute exacerbation of CHF (congestive heart failure) ([place]) [I50.9] CHF exacerbation ([place]) [I50.9] Acute respiratory failure with hypoxia ([place]) [J96.01] Acute on chronic congestive heart failure, unspecified  heart failure type [facility]) [I50.9] Patient Active Problem List   Diagnosis Date Noted   CKD (chronic kidney disease), stage III ([place]) 11/21/2021   Pain in a tooth or teeth 11/20/2021   Acute on chronic respiratory failure with hypoxia (HCC)    Elevated troponin 11/18/2021    Acute exacerbation of CHF (congestive heart failure) ([place]) 11/18/2021   Thrombocytopenia ([place]) 02/11/2019   Chest pain 01/05/2019   Hyponatremia 06/09/2017   MI, acute, non ST segment elevation ([place]) 06/01/2017   Acute upper back pain 03/05/2017   Carcinoma of unknown primary ([place]) 03/05/2017   Liver lesion 02/27/2017   Diabetes ([place]) 02/24/2017   Acute on chronic systolic CHF (congestive heart failure) ([place]) 02/24/2017   HTN (hypertension) 02/24/2017   GERD (gastroesophageal reflux disease) 02/24/2017   Hypothyroidism 02/24/2017   Hypoglycemia 02/24/2017   Dizziness 02/18/2017   PCP:  Tracie Harrier, MD Pharmacy:   [facility], [place] [address] Phone: [phone] Fax: [phone]  [facility] [place], [place] [address] Phone: [phone] Fax: [phone]     Social Determinants of Health (SDOH) Interventions    Readmission Risk Interventions     No data to display

## 2021-11-23 DIAGNOSIS — I5023 Acute on chronic systolic (congestive) heart failure: Secondary | ICD-10-CM | POA: Diagnosis not present

## 2021-11-23 DIAGNOSIS — K0889 Other specified disorders of teeth and supporting structures: Secondary | ICD-10-CM | POA: Diagnosis not present

## 2021-11-23 DIAGNOSIS — I10 Essential (primary) hypertension: Secondary | ICD-10-CM | POA: Diagnosis not present

## 2021-11-23 DIAGNOSIS — J9621 Acute and chronic respiratory failure with hypoxia: Secondary | ICD-10-CM | POA: Diagnosis not present

## 2021-11-23 LAB — CBC
HCT: 54.9 % — ABNORMAL HIGH (ref 36.0–46.0)
Hemoglobin: 17.3 g/dL — ABNORMAL HIGH (ref 12.0–15.0)
MCH: 29.1 pg (ref 26.0–34.0)
MCHC: 31.5 g/dL (ref 30.0–36.0)
MCV: 92.3 fL (ref 80.0–100.0)
Platelets: 97 10*3/uL — ABNORMAL LOW (ref 150–400)
RBC: 5.95 MIL/uL — ABNORMAL HIGH (ref 3.87–5.11)
RDW: 14.7 % (ref 11.5–15.5)
WBC: 5.1 10*3/uL (ref 4.0–10.5)
nRBC: 0 % (ref 0.0–0.2)

## 2021-11-23 LAB — GLUCOSE, CAPILLARY
Glucose-Capillary: 134 mg/dL — ABNORMAL HIGH (ref 70–99)
Glucose-Capillary: 202 mg/dL — ABNORMAL HIGH (ref 70–99)
Glucose-Capillary: 120 mg/dL — ABNORMAL HIGH (ref 70–99)
Glucose-Capillary: 182 mg/dL — ABNORMAL HIGH (ref 70–99)

## 2021-11-23 LAB — BASIC METABOLIC PANEL
Anion gap: 7 (ref 5–15)
BUN: 44 mg/dL — ABNORMAL HIGH (ref 8–23)
CO2: 32 mmol/L (ref 22–32)
Calcium: 9 mg/dL (ref 8.9–10.3)
Chloride: 101 mmol/L (ref 98–111)
Creatinine, Ser: 1.06 mg/dL — ABNORMAL HIGH (ref 0.44–1.00)
GFR, Estimated: 51 mL/min — ABNORMAL LOW (ref >60)
Glucose, Bld: 129 mg/dL — ABNORMAL HIGH (ref 70–99)
Potassium: 4 mmol/L (ref 3.5–5.1)
Sodium: 140 mmol/L (ref 135–145)

## 2021-11-23 NOTE — Progress Notes (Signed)
Progress Note   [name] SNK:[id number] DOB: January 02, 1933 DOA: 11/18/2021     5 DOS: the patient was seen and examined on 11/23/2021     Assessment and Plan: * Acute on chronic systolic CHF (congestive heart failure) (HCC) EF 20 to 25% on last echocardiogram.  Continue Jardiance.  Continue IV Lasix while here.  We will switch to torsemide upon disposition.  So far tolerating low-dose Entresto.  Acute on chronic respiratory failure with hypoxia (HCC) Prior to coming in the hospital patient had a pulse ox in the 70s by EMS.  Came to the hospital on CPAP and was switched over to BiPAP and given Lasix.  ER physician documented increased work of breathing and respiratory distress and tachypnea.  Patient chronically wears 4 L of oxygen at home and currently back to 4 L.  Patient desaturated into the 80s with working with OT yesterday  Pain in a tooth or teeth Continue clindamycin and will give a short course.  HTN (hypertension) - Continue Coreg, Imdur, Lasix and Entresto  Hypothyroidism Continue Synthroid   Elevated troponin Demand ischemia from high blood pressure and heart failure  Diabetes ([place]) Continue Jardiance  Weakness Physical therapy recommending rehab.  CKD (chronic kidney disease), stage III (HCC) CKD stage IIIa  Thrombocytopenia (HCC) Chronic thrombocytopenia.        Subjective: Patient still has a little bit of mouth pain.  Breathing little bit better than yesterday.  Still little short of breath.  As per caregiver at the bedside she does do a lot of sleeping during the day.  Patient sweating a little bit today.  Received the COVID vaccination yesterday.  No chest pain.  Physical Exam: Vitals:   11/22/21 2334 11/23/21 0424 11/23/21 0745 11/23/21 1217  BP: (!) 162/68 (!) 156/83 (!) 167/80 (!) 100/56  Pulse: 72 66 76 67  Resp: '16 16 18 19  '$ Temp: 98 F (36.7 C) 97.8 F (36.6 C) (!) 97.5 F (36.4 C) 98 F (36.7 C)  TempSrc: Oral Oral    SpO2:  92% 94% 94% 94%  Weight:  57.3 kg    Height:       Physical Exam HENT:     Head: Normocephalic.     Mouth/Throat:     Pharynx: No oropharyngeal exudate.  Eyes:     General: Lids are normal.     Conjunctiva/sclera: Conjunctivae normal.  Neck:     Comments: Lymph node left neck underneath the jaw. Cardiovascular:     Rate and Rhythm: Normal rate and regular rhythm.     Heart sounds: Normal heart sounds, S1 normal and S2 normal.  Pulmonary:     Breath sounds: Examination of the right-lower field reveals decreased breath sounds. Examination of the left-lower field reveals decreased breath sounds. Decreased breath sounds present. No wheezing, rhonchi or rales.  Abdominal:     Palpations: Abdomen is soft.     Tenderness: There is no abdominal tenderness.  Musculoskeletal:     Right lower leg: No swelling.     Left lower leg: No swelling.  Skin:    General: Skin is warm.     Findings: No rash.  Neurological:     Mental Status: She is alert and oriented to person, place, and time.     Data Reviewed: Creatinine 1.06  Family Communication: Spoke with daughter yesterday.  Spoke with caregiver at the bedside today  Disposition: Status is: Inpatient Remains inpatient appropriate because: Patient requires rehab and will go to [facility].  Needed to have the COVID vaccination yesterday prior to being accepted to rehab.  Medically stable to go out to rehab.  We will continue IV Lasix while here and switch over to oral torsemide upon disposition. Planned Discharge Destination: Rehab   Author: Loletha Grayer, MD 11/23/2021 1:09 PM  For on call review [email or web address].

## 2021-11-24 ENCOUNTER — Inpatient Hospital Stay: Payer: Medicare Other

## 2021-11-24 DIAGNOSIS — I5023 Acute on chronic systolic (congestive) heart failure: Secondary | ICD-10-CM | POA: Diagnosis not present

## 2021-11-24 DIAGNOSIS — K0889 Other specified disorders of teeth and supporting structures: Secondary | ICD-10-CM | POA: Diagnosis not present

## 2021-11-24 DIAGNOSIS — J9621 Acute and chronic respiratory failure with hypoxia: Secondary | ICD-10-CM | POA: Diagnosis not present

## 2021-11-24 DIAGNOSIS — I10 Essential (primary) hypertension: Secondary | ICD-10-CM | POA: Diagnosis not present

## 2021-11-24 LAB — GLUCOSE, CAPILLARY
Glucose-Capillary: 134 mg/dL — ABNORMAL HIGH (ref 70–99)
Glucose-Capillary: 172 mg/dL — ABNORMAL HIGH (ref 70–99)
Glucose-Capillary: 165 mg/dL — ABNORMAL HIGH (ref 70–99)
Glucose-Capillary: 130 mg/dL — ABNORMAL HIGH (ref 70–99)

## 2021-11-24 LAB — CULTURE, BLOOD (ROUTINE X 2): Culture: NO GROWTH

## 2021-11-24 LAB — BASIC METABOLIC PANEL
Anion gap: 6 (ref 5–15)
BUN: 46 mg/dL — ABNORMAL HIGH (ref 8–23)
CO2: 32 mmol/L (ref 22–32)
Calcium: 9.4 mg/dL (ref 8.9–10.3)
Chloride: 104 mmol/L (ref 98–111)
Creatinine, Ser: 0.96 mg/dL (ref 0.44–1.00)
GFR, Estimated: 57 mL/min — ABNORMAL LOW (ref >60)
Glucose, Bld: 129 mg/dL — ABNORMAL HIGH (ref 70–99)
Potassium: 4.1 mmol/L (ref 3.5–5.1)
Sodium: 142 mmol/L (ref 135–145)

## 2021-11-24 MED ORDER — TORSEMIDE 20 MG PO TABS
20.0000 mg | ORAL_TABLET | Freq: Every day | ORAL | Status: DC
  Administered 2021-11-25: 20 mg via ORAL
  Filled 2021-11-24: qty 1

## 2021-11-24 NOTE — Progress Notes (Signed)
  Progress Note   [name] KKX:[id number] DOB: 02-Apr-1933 DOA: 11/18/2021     6 DOS: the patient was seen and examined on 11/24/2021   Brief hospital course: No notes on file  Assessment and Plan: * Acute on chronic systolic CHF (congestive heart failure) (HCC) EF 20 to 25% on last echocardiogram.  Continue Jardiance.  Continue IV Lasix today and switch to torsemide for tomorrow.  So far tolerating low-dose Entresto.  Acute on chronic respiratory failure with hypoxia (HCC) Prior to coming in the hospital patient had a pulse ox in the 70s by EMS.  Came to the hospital on CPAP and was switched over to BiPAP and given Lasix.  ER physician documented increased work of breathing and respiratory distress and tachypnea.  Patient chronically wears 4 L of oxygen at home and currently back to 4 L.  Patient desaturated only to 39 with working with PT yesterday.  Pain in a tooth or teeth Continue clindamycin and will give a short course.  Will discontinue upon discharge.  HTN (hypertension) Continue Coreg, Imdur, Lasix and Entresto  Hypothyroidism Continue Synthroid   Elevated troponin Demand ischemia from high blood pressure and heart failure  Diabetes ([place]) Continue Jardiance  Weakness Physical therapy recommending rehab.  CKD (chronic kidney disease), stage III (HCC) CKD stage IIIa  Thrombocytopenia (HCC) Chronic thrombocytopenia.        Subjective: Patient feeling better with regards to her breathing.  Urinating well.  Admitted with acute systolic congestive heart failure and acute on chronic hypoxic respiratory failure.  Physical Exam: Vitals:   11/23/21 2326 11/24/21 0343 11/24/21 0728 11/24/21 1222  BP: (!) 155/69 (!) 127/57 (!) 173/66 (!) 131/58  Pulse: 72 72 68 62  Resp: '16 15 19 18  '$ Temp: 98.2 F (36.8 C) 97.9 F (36.6 C) 98.8 F (37.1 C) 97.6 F (36.4 C)  TempSrc: Oral Oral    SpO2: 94% 95% 95% 100%  Weight:      Height:       Physical  Exam HENT:     Head: Normocephalic.     Mouth/Throat:     Pharynx: No oropharyngeal exudate.  Eyes:     General: Lids are normal.     Conjunctiva/sclera: Conjunctivae normal.  Neck:     Comments: Lymph node left neck underneath the jaw. Cardiovascular:     Rate and Rhythm: Normal rate and regular rhythm.     Heart sounds: Normal heart sounds, S1 normal and S2 normal.  Pulmonary:     Breath sounds: Examination of the right-lower field reveals decreased breath sounds. Examination of the left-lower field reveals decreased breath sounds. Decreased breath sounds present. No wheezing, rhonchi or rales.  Abdominal:     Palpations: Abdomen is soft.     Tenderness: There is no abdominal tenderness.  Musculoskeletal:     Right lower leg: No swelling.     Left lower leg: No swelling.  Skin:    General: Skin is warm.     Findings: No rash.  Neurological:     Mental Status: She is alert and oriented to person, place, and time.     Data Reviewed: Creatinine 0.96  Family Communication: Spoke with patient's daughter at the bedside  Disposition: Status is: Inpatient Remains inpatient appropriate because: Rehab facility accepted on Friday afternoon for Monday admission.  Stable to go out to rehab tomorrow (Monday) Planned Discharge Destination: Rehab  Author: Loletha Grayer, MD 11/24/2021 2:04 PM  For on call review [email or web address].

## 2021-11-24 NOTE — TOC Progression Note (Signed)
Transition of Care [facility]) - Progression Note    Patient Details  Name: IVIONA HOLE MRN: [id number] Date of Birth: 12/28/1932  Transition of [facility]) CM/SW Contact  Izola Price, RN Phone Number: 11/24/2021, 2:59 PM  Clinical Narrative:  6'11: Confirmed via MAR that Covid booster given on 11/22/21 per orders. Simmie Davies RN CM      Expected Discharge Plan: Skilled Nursing Facility Barriers to Discharge: Continued Medical Work up  Expected Discharge Plan and Services Expected Discharge Plan: [place] In-house Referral: Clinical Social Work   Post Acute Care Choice: NA Living arrangements for the past 2 months: Single Family Home                                       Social Determinants of Health (SDOH) Interventions    Readmission Risk Interventions     No data to display

## 2021-11-25 ENCOUNTER — Inpatient Hospital Stay: Payer: Medicare Other

## 2021-11-25 DIAGNOSIS — R042 Hemoptysis: Secondary | ICD-10-CM

## 2021-11-25 LAB — CBC
HCT: 52.5 % — ABNORMAL HIGH (ref 36.0–46.0)
Hemoglobin: 16.8 g/dL — ABNORMAL HIGH (ref 12.0–15.0)
MCH: 29.3 pg (ref 26.0–34.0)
MCHC: 32 g/dL (ref 30.0–36.0)
MCV: 91.5 fL (ref 80.0–100.0)
Platelets: 103 10*3/uL — ABNORMAL LOW (ref 150–400)
RBC: 5.74 MIL/uL — ABNORMAL HIGH (ref 3.87–5.11)
RDW: 14.8 % (ref 11.5–15.5)
WBC: 8.5 10*3/uL (ref 4.0–10.5)
nRBC: 0 % (ref 0.0–0.2)

## 2021-11-25 LAB — BASIC METABOLIC PANEL
Anion gap: 5 (ref 5–15)
BUN: 55 mg/dL — ABNORMAL HIGH (ref 8–23)
CO2: 33 mmol/L — ABNORMAL HIGH (ref 22–32)
Calcium: 9.3 mg/dL (ref 8.9–10.3)
Chloride: 105 mmol/L (ref 98–111)
Creatinine, Ser: 1 mg/dL (ref 0.44–1.00)
GFR, Estimated: 55 mL/min — ABNORMAL LOW (ref >60)
Glucose, Bld: 139 mg/dL — ABNORMAL HIGH (ref 70–99)
Potassium: 4.3 mmol/L (ref 3.5–5.1)
Sodium: 143 mmol/L (ref 135–145)

## 2021-11-25 LAB — GLUCOSE, CAPILLARY
Glucose-Capillary: 137 mg/dL — ABNORMAL HIGH (ref 70–99)
Glucose-Capillary: 144 mg/dL — ABNORMAL HIGH (ref 70–99)
Glucose-Capillary: 150 mg/dL — ABNORMAL HIGH (ref 70–99)
Glucose-Capillary: 214 mg/dL — ABNORMAL HIGH (ref 70–99)

## 2021-11-25 MED ORDER — TORSEMIDE 20 MG PO TABS: 20.0000 mg | ORAL_TABLET | Freq: Every day | ORAL | 0 refills | Status: DC

## 2021-11-25 MED ORDER — SACUBITRIL-VALSARTAN 24-26 MG PO TABS: 1.0000 | ORAL_TABLET | Freq: Two times a day (BID) | ORAL | 0 refills | Status: DC

## 2021-11-25 MED ORDER — IOHEXOL 350 MG/ML SOLN
75.0000 mL | Freq: Once | INTRAVENOUS | Status: AC | PRN
  Administered 2021-11-25: 75 mL via INTRAVENOUS

## 2021-11-25 NOTE — Care Management Important Message (Signed)
Important Message  Patient Details  Name: Jacqueline [name] MRN: [id number] Date of Birth: 05-23-1933   Medicare Important Message Given:  Yes     Juliann Pulse A Desaree Downen 11/25/2021, 3:39 PM

## 2021-11-25 NOTE — Discharge Summary (Signed)
Physician Discharge Summary   [name] MRN: [id number] DOB: January 12, 1933  Admit date:     11/18/2021  Discharge date: 11/25/21  Discharge Physician: Loletha Grayer   PCP: Tracie Harrier, MD   Recommendations at discharge:   Follow-up team at rehab 1 day  Discharge Diagnoses: Principal Problem:   Acute on chronic systolic CHF (congestive heart failure) (HCC) Active Problems:   Acute on chronic respiratory failure with hypoxia (HCC)   Pain in a tooth or teeth   HTN (hypertension)   Hypothyroidism   Elevated troponin   Diabetes (HCC)   GERD (gastroesophageal reflux disease)   Thrombocytopenia (HCC)   Acute exacerbation of CHF (congestive heart failure) (HCC)   CKD (chronic kidney disease), stage III ([place])   Weakness    Hospital Course: The patient was admitted to the hospital on 11/18/2021 and discharged out to rehab on 11/25/2021.  Patient was admitted with acute on chronic hypoxic respiratory failure initially requiring BiPAP with her pulse ox being in the 70s.  She was diuresed with IV Lasix for acute on chronic systolic congestive heart failure.  She received IV Lasix throughout the entire hospital course and will be switched over to torsemide upon discharge.  Echocardiogram showed an EF of 20 to 25%.  She was placed on low-dose Entresto.  Patient was also continued on Jardiance.  The patient has an allergy to spironolactone.  The patient was breathing better upon discharge and back down to her chronic 4 L of oxygen.  Physical therapy and Occupational Therapy recommended rehab.  During the hospital course she did have pain in her tooth.  I did give 5 days of clindamycin which should complete a course.  The patient did have 1 episode of hemoptysis on 11/24/2021.  Chest x-ray was negative.  CT scan of the chest did not show any pulmonary embolism.  Did show a groundglass opacity and they recommended a CT scan and 6 months for comparison.  The 5 days of clindamycin should have  covered this if infectious.  Assessment and Plan: * Acute on chronic systolic CHF (congestive heart failure) (HCC) EF 20 to 25% on last echocardiogram.  Continue Jardiance.  The patient was diuresed with IV Lasix during the entire hospital course.  Switch to torsemide today.  So far tolerating low-dose Entresto.  The patient has an allergy to spironolactone.  Continue daily weights.  Follow-up with the CHF clinic.  Acute on chronic respiratory failure with hypoxia (HCC) Prior to coming in the hospital patient had a pulse ox in the 70s by EMS.  Came to the hospital on CPAP and was switched over to BiPAP and given Lasix.  ER physician documented increased work of breathing and respiratory distress and tachypnea.  Patient chronically wears 4 L of oxygen at home and currently back to 4 L.   Continue working with physical therapy.  Pain in a tooth or teeth The patient completed 5 days of clindamycin.  HTN (hypertension) Continue Coreg, Imdur, Lasix and Entresto  Hypothyroidism Continue Synthroid  Elevated troponin Demand ischemia from high blood pressure and heart failure  Diabetes ([place]) Continue Jardiance.  All fingersticks have been good.  Can check sugars once a day  Weakness Physical therapy recommending rehab.  CKD (chronic kidney disease), stage III (HCC) CKD stage IIIa.  Creatinine upon discharge 1.0 with a GFR 55  Thrombocytopenia (HCC) Chronic thrombocytopenia.  Last platelet count 103  1 episode of hemoptysis on 11/24/2021 Holding aspirin for now.  Heparin subcutaneous injections  discontinued.  Can restart aspirin and a few days if no further hemoptysis.  CT scan of the chest today showed no pulmonary embolism.  Did show an area of bronchiectasis right upper lobe.  Will give incentive spirometry.  Did show 1 area of groundglass opacity which could be infectious or inflammatory.  Received a full course of clindamycin here in the hospital which would treat if infectious.   Recommend a repeat CT scan for comparison in 6 months.         Consultants: Cardiology Procedures performed: None Disposition: Rehabilitation facility Diet recommendation:  Cardiac and Carb modified diet DISCHARGE MEDICATION: Allergies as of 11/25/2021       Reactions   Ciprofloxacin    Digoxin And Related    Erythromycin    Lopid [gemfibrozil]    Nitrofurantoin    Spironolactone Other (See Comments)   Hospitalized for dehydration   Cefuroxime Rash   Penicillins Rash   Has patient had a PCN reaction causing immediate rash, facial/tongue/throat swelling, SOB or lightheadedness with hypotension: Unknown Has patient had a PCN reaction causing severe rash involving mucus membranes or skin necrosis: No Has patient had a PCN reaction that required hospitalization: No Has patient had a PCN reaction occurring within the last 10 years: No If all of the above answers are "NO", then may proceed with Cephalosporin use.        Medication List     STOP taking these medications    aspirin EC 81 MG tablet       TAKE these medications    alendronate 70 MG tablet Commonly known as: FOSAMAX Take 70 mg by mouth once a week. Take with a full glass of water on an empty stomach.   carvedilol 12.5 MG tablet Commonly known as: COREG Take by mouth 2 (two) times daily with a meal.   ergocalciferol 1.25 MG (50000 UT) capsule Commonly known as: VITAMIN D2 Take 50,000 Units by mouth once a week.   glucose blood test strip USE TO CHECK BLOOD SUGARS TWICE A DAY   iron polysaccharides 150 MG capsule Commonly known as: NIFEREX Take 150 mg by mouth daily.   isosorbide mononitrate 60 MG 24 hr tablet Commonly known as: IMDUR Take 0.5 tablets (30 mg total) by mouth daily.   Jardiance 10 MG Tabs tablet Generic drug: empagliflozin Take 10 mg by mouth daily.   Klor-Con M10 10 MEQ tablet Generic drug: potassium chloride Take 10 mEq by mouth daily.   levothyroxine 25 MCG  tablet Commonly known as: SYNTHROID Take 25 mcg by mouth daily before breakfast.   pantoprazole 40 MG tablet Commonly known as: PROTONIX Take 40 mg by mouth daily.   sacubitril-valsartan 24-26 MG Commonly known as: ENTRESTO Take 1 tablet by mouth 2 (two) times daily.   sertraline 50 MG tablet Commonly known as: ZOLOFT Take 50 mg by mouth daily.   torsemide 20 MG tablet Commonly known as: DEMADEX Take 1 tablet (20 mg total) by mouth daily. Start taking on: November 26, 2021 What changed:  medication strength how much to take        Follow-up Information     Corey Skains, MD. Go in 1 week(s).   Specialty: Cardiology Contact information: [address] [facility] [place] [address] [phone]                Discharge Exam: Danley Danker Weights   11/21/21 1254 11/22/21 0500 11/23/21 0424  Weight: 56.9 kg 57.1 kg 57.3 kg  Physical Exam HENT:     Head: Normocephalic.     Mouth/Throat:     Pharynx: No oropharyngeal exudate.  Eyes:     General: Lids are normal.     Conjunctiva/sclera: Conjunctivae normal.  Neck:     Comments: Lymph node left neck underneath the jaw. Cardiovascular:     Rate and Rhythm: Normal rate and regular rhythm.     Heart sounds: Normal heart sounds, S1 normal and S2 normal.  Pulmonary:     Breath sounds: Examination of the right-lower field reveals decreased breath sounds. Examination of the left-lower field reveals decreased breath sounds. Decreased breath sounds present. No wheezing, rhonchi or rales.  Abdominal:     Palpations: Abdomen is soft.     Tenderness: There is no abdominal tenderness.  Musculoskeletal:     Right lower leg: No swelling.     Left lower leg: No swelling.  Skin:    General: Skin is warm.     Findings: No rash.  Neurological:     Mental Status: She is alert and oriented to person, place, and time.      Condition at discharge: stable  The results of significant  diagnostics from this hospitalization (including imaging, microbiology, ancillary and laboratory) are listed below for reference.   Imaging Studies: CT Angio Chest Pulmonary Embolism (PE) W or WO Contrast  Result Date: 11/25/2021 CLINICAL DATA:  hemoptysis EXAM: CT ANGIOGRAPHY CHEST WITH CONTRAST TECHNIQUE: Multidetector CT imaging of the chest was performed using the standard protocol during bolus administration of intravenous contrast. Multiplanar CT image reconstructions and MIPs were obtained to evaluate the vascular anatomy. RADIATION DOSE REDUCTION: This exam was performed according to the departmental dose-optimization program which includes automated exposure control, adjustment of the mA and/or kV according to patient size and/or use of iterative reconstruction technique. CONTRAST:  13m OMNIPAQUE IOHEXOL 350 MG/ML SOLN COMPARISON:  CT 08/19/2018 FINDINGS: Cardiovascular: There is contrast mixing in the right and left branch pulmonary arteries and right lower lobe arteries somewhat limiting evaluation. Within this limitation there is no evidence of pulmonary embolism. Mild cardiomegaly. Enlarged main pulmonary artery and mildly dilated right ventricle suggesting pulmonary hypertension. No pericardial disease.Coronary artery atherosclerosis. Mediastinum/Nodes: No lymphadenopathy. The thyroid is unremarkable. There is either nodular wall thickening or ingested contents within the upper esophagus (series 5, images 60-73) Lungs/Pleura: Chronic complete right upper lobe atelectasis/scarring and mild bronchiectasis. Medial basilar right lower lobe nodule measures 6 mm, previously 6 mm, stable since 2018 and consider benign (series 6, image 69). There are multiple stable calcified granulomas. There is a 5 mm left upper lobe pulmonary nodule which is stable since March 2020 and also likely benign (series 6, image 26). There is a new ground-glass opacity or nodule which measures 9 mm in the right middle lobe  (series 6, image 32). No pleural effusion. No pneumothorax. Upper Abdomen: Hepatic cirrhosis. Subcentimeter hypodense segment 7 lesion is unchanged. Musculoskeletal: Unchanged left proximal humerus probable enchondroma versus bone infarct. No acute osseous abnormality. Multilevel degenerative changes of the spine. There is a severe T6 compression deformity with prior vertebroplasty which is unchanged from prior exam. No new compression deformity. Review of the MIP images confirms the above findings. IMPRESSION: No evidence of pulmonary embolism. Note there is a degree of contrast mixing in the right and left branch pulmonary arteries and right lower lobe arteries which somewhat limits evaluation. Chronic complete right upper lobe atelectasis with associated bronchiectasis. Findings suggestive of pulmonary arterial hypertension. New ground-glass opacity/ground-glass nodule measuring  9 mm in the right middle lobe, which could be infectious/inflammatory. Stable other pulmonary nodules. Initial follow-up with CT at 6 months is recommended to confirm persistence. If persistent, repeat CT is recommended every 2 years until 5 years of stability has been established. This recommendation follows the consensus statement: Guidelines for Management of Incidental Pulmonary Nodules Detected on CT Images: From the Fleischner Society 2017; Radiology 2017; 284:228-243. Coronary artery atherosclerosis. Hepatic cirrhosis. Electronically Signed   By: Maurine Simmering M.D.   On: 11/25/2021 12:52   DG Chest Port 1 View  Result Date: 11/24/2021 CLINICAL DATA:  Respiratory distress and hemoptysis. EXAM: PORTABLE CHEST 1 VIEW COMPARISON:  November 18, 2021 FINDINGS: The heart size and mediastinal contours are within normal limits. There is marked severity calcification of the thoracic aorta. Mild, diffuse, chronic appearing increased interstitial lung markings are seen. There is no evidence of acute infiltrate, pleural effusion or pneumothorax. A  stable enchondroma is suspected within the proximal left humerus. IMPRESSION: Chronic appearing increased interstitial lung markings without evidence of acute or active cardiopulmonary disease. Electronically Signed   By: Virgina Norfolk M.D.   On: 11/24/2021 16:34   ECHOCARDIOGRAM COMPLETE  Result Date: 11/19/2021    ECHOCARDIOGRAM REPORT   Patient Name:   REILYN NELSON Date of Exam: 11/18/2021 Medical Rec #:  [id number]     Height:       57.0 in Accession #:    [id number]    Weight:       134.0 lb Date of Birth:  1932/06/22     BSA:          1.517 m Patient Age:    88 years      BP:           183/100 mmHg Patient Gender: F             HR:           78 bpm. Exam Location:  [facility] Procedure: 2D Echo, Color Doppler and Cardiac Doppler Indications:     I50.9 Congestive heart failure  History:         Patient has prior history of Echocardiogram examinations. CHF                  and Cardiomyopathy; Risk Factors:Former Smoker, Diabetes,                  Hypertension and Dyslipidemia.  Sonographer:     Rosalia Hammers Referring Phys:  [id number] ASIA B [place] Diagnosing Phys: Serafina Royals MD  Sonographer Comments: Technically difficult study due to poor echo windows. Image acquisition challenging due to respiratory motion. IMPRESSIONS  1. Left ventricular ejection fraction, by estimation, is 20 to 25%. The left ventricle has severely decreased function. The left ventricle demonstrates global hypokinesis. The left ventricular internal cavity size was mildly dilated. There is severe left ventricular hypertrophy. Left ventricular diastolic parameters are consistent with Grade II diastolic dysfunction (pseudonormalization).  2. Right ventricular systolic function is normal. The right ventricular size is normal.  3. Left atrial size was mildly dilated.  4. The mitral valve is normal in structure. Mild mitral valve regurgitation.  5. The aortic valve is normal in structure. Aortic valve regurgitation is trivial. FINDINGS   Left Ventricle: Left ventricular ejection fraction, by estimation, is 20 to 25%. The left ventricle has severely decreased function. The left ventricle demonstrates global hypokinesis. The left ventricular internal cavity size was mildly dilated. There is severe left ventricular hypertrophy. Left ventricular  diastolic parameters are consistent with Grade II diastolic dysfunction (pseudonormalization). Right Ventricle: The right ventricular size is normal. No increase in right ventricular wall thickness. Right ventricular systolic function is normal. Left Atrium: Left atrial size was mildly dilated. Right Atrium: Right atrial size was normal in size. Pericardium: There is no evidence of pericardial effusion. Mitral Valve: The mitral valve is normal in structure. Mild mitral valve regurgitation. Tricuspid Valve: The tricuspid valve is normal in structure. Tricuspid valve regurgitation is mild. Aortic Valve: The aortic valve is normal in structure. Aortic valve regurgitation is trivial. Aortic valve mean gradient measures 3.0 mmHg. Aortic valve peak gradient measures 5.7 mmHg. Aortic valve area, by VTI measures 1.61 cm. Pulmonic Valve: The pulmonic valve was normal in structure. Pulmonic valve regurgitation is mild. Aorta: The aortic root and ascending aorta are structurally normal, with no evidence of dilitation. IAS/Shunts: No atrial level shunt detected by color flow Doppler.  LEFT VENTRICLE PLAX 2D LVIDd:         4.54 cm   Diastology LVIDs:         3.25 cm   LV e' medial:  4.03 cm/s LV PW:         1.42 cm   LV e' lateral: 6.74 cm/s LV IVS:        1.07 cm LVOT diam:     1.70 cm LV SV:         32 LV SV Index:   21 LVOT Area:     2.27 cm  RIGHT VENTRICLE RV Basal diam:  3.77 cm RV S prime:     7.80 cm/s LEFT ATRIUM             Index        RIGHT ATRIUM           Index LA diam:        3.50 cm 2.31 cm/m   RA Area:     14.40 cm LA Vol (A2C):   46.8 ml 30.85 ml/m  RA Volume:   36.50 ml  24.06 ml/m LA Vol (A4C):    40.0 ml 26.37 ml/m LA Biplane Vol: 44.2 ml 29.13 ml/m  AORTIC VALVE                    PULMONIC VALVE AV Area (Vmax):    1.23 cm     PV Vmax:          0.75 m/s AV Area (Vmean):   1.24 cm     PV Vmean:         58.700 cm/s AV Area (VTI):     1.61 cm     PV VTI:           0.154 m AV Vmax:           119.00 cm/s  PV Peak grad:     2.2 mmHg AV Vmean:          77.000 cm/s  PV Mean grad:     1.0 mmHg AV VTI:            0.197 m      PR End Diast Vel: 12.53 msec AV Peak Grad:      5.7 mmHg AV Mean Grad:      3.0 mmHg LVOT Vmax:         64.30 cm/s LVOT Vmean:        42.200 cm/s LVOT VTI:          0.140 m LVOT/AV  VTI ratio: 0.71  AORTA Ao Root diam: 3.30 cm  SHUNTS Systemic VTI:  0.14 m Systemic Diam: 1.70 cm Serafina Royals MD Electronically signed by Serafina Royals MD Signature Date/Time: 11/19/2021/1:08:01 PM    Final    DG Chest Portable 1 View  Result Date: 11/18/2021 CLINICAL DATA:  Shortness of breath and respiratory distress. EXAM: PORTABLE CHEST 1 VIEW COMPARISON:  None Available. FINDINGS: The heart size and mediastinal contours are within normal limits. There is marked severity calcification and tortuosity of the thoracic aorta. Mild, diffuse chronic appearing increased interstitial lung markings are seen. There is no evidence of focal consolidation, pleural effusion or pneumothorax. Prior vertebroplasty is noted within the midthoracic spine. A stable enchondroma is suspected within the proximal left humerus. IMPRESSION: Chronic appearing increased interstitial lung markings without focal consolidation. A mild superimposed component of interstitial edema cannot be excluded. Electronically Signed   By: Virgina Norfolk M.D.   On: 11/18/2021 01:46    Microbiology: Results for orders placed or performed during the hospital encounter of 11/18/21  Resp Panel by RT-PCR (Flu A&B, Covid) Anterior Nasal Swab     Status: None   Collection Time: 11/18/21  1:36 AM   Specimen: Anterior Nasal Swab  Result Value Ref  Range Status   SARS Coronavirus 2 by RT PCR NEGATIVE NEGATIVE Final    Comment: (NOTE) SARS-CoV-2 target nucleic acids are NOT DETECTED.  The SARS-CoV-2 RNA is generally detectable in upper respiratory specimens during the acute phase of infection. The lowest concentration of SARS-CoV-2 viral copies this assay can detect is 138 copies/mL. A negative result does not preclude SARS-Cov-2 infection and should not be used as the sole basis for treatment or other patient management decisions. A negative result may occur with  improper specimen collection/handling, submission of specimen other than nasopharyngeal swab, presence of viral mutation(s) within the areas targeted by this assay, and inadequate number of viral copies(<138 copies/mL). A negative result must be combined with clinical observations, patient history, and epidemiological information. The expected result is Negative.  Fact Sheet for Patients:  EntrepreneurPulse.com.au  Fact Sheet for Healthcare Providers:  IncredibleEmployment.be  This test is no t yet approved or cleared by the [place] FDA and  has been authorized for detection and/or diagnosis of SARS-CoV-2 by FDA under an Emergency Use Authorization (EUA). This EUA will remain  in effect (meaning this test can be used) for the duration of the COVID-19 declaration under Section 564(b)(1) of the Act, 21 U.S.C.section 360bbb-3(b)(1), unless the authorization is terminated  or revoked sooner.       Influenza A by PCR NEGATIVE NEGATIVE Final   Influenza B by PCR NEGATIVE NEGATIVE Final    Comment: (NOTE) The Xpert Xpress SARS-CoV-2/FLU/RSV plus assay is intended as an aid in the diagnosis of influenza from Nasopharyngeal swab specimens and should not be used as a sole basis for treatment. Nasal washings and aspirates are unacceptable for Xpert Xpress SARS-CoV-2/FLU/RSV testing.  Fact Sheet for  Patients: EntrepreneurPulse.com.au  Fact Sheet for Healthcare Providers: IncredibleEmployment.be  This test is not yet approved or cleared by the [place] FDA and has been authorized for detection and/or diagnosis of SARS-CoV-2 by FDA under an Emergency Use Authorization (EUA). This EUA will remain in effect (meaning this test can be used) for the duration of the COVID-19 declaration under Section 564(b)(1) of the Act, 21 U.S.C. section 360bbb-3(b)(1), unless the authorization is terminated or revoked.  Performed at [facility], [address], [place], [place] [address]  Blood culture (routine x 2)     Status: None   Collection Time: 11/18/21  1:37 AM   Specimen: BLOOD  Result Value Ref Range Status   Specimen Description BLOOD RIGHT FA  Final   Special Requests   Final    BOTTLES DRAWN AEROBIC AND ANAEROBIC Blood Culture results may not be optimal due to an inadequate volume of blood received in culture bottles   Culture   Final    NO GROWTH 6 DAYS Performed at [facility], [address], [place], [place] [address]    Report Status 11/24/2021 FINAL  Final  Blood culture (routine x 2)     Status: Abnormal   Collection Time: 11/18/21  1:38 AM   Specimen: BLOOD  Result Value Ref Range Status   Specimen Description   Final    BLOOD RIGHT ARM Performed at [facility], [address] [place], [place] [address]    Special Requests   Final    BOTTLES DRAWN AEROBIC AND ANAEROBIC Blood Culture adequate volume Performed at [facility], [address] [place], [place] [address]    Culture  Setup Time   Final    GRAM POSITIVE RODS ANAEROBIC BOTTLE ONLY CRITICAL RESULT CALLED TO, READ BACK BY AND VERIFIED WITH: CARRISA [facility] 11/18/21 1814 MU GRAM STAIN REVIEWED-AGREE WITH RESULT    Culture (A)  Final    BACILLUS SPECIES Standardized susceptibility testing for this organism is not  available. Performed at [facility], [place] [address], [place], [place] [address]    Report Status 11/22/2021 FINAL  Final    Labs: CBC: Recent Labs  Lab 11/18/21 1459 11/19/21 0457 11/20/21 0539 11/23/21 0629 11/25/21 0447  WBC 11.7* 9.2 4.8 5.1 8.5  NEUTROABS 8.2*  --   --   --   --   HGB 18.2* 16.8* 16.4* 17.3* 16.8*  HCT 58.3* 52.7* 52.5* 54.9* 52.5*  MCV 94.5 92.1 93.1 92.3 91.5  PLT 110* 120* 94* 97* 606*   Basic Metabolic Panel: Recent Labs  Lab 11/18/21 1459 11/18/21 2112 11/19/21 0457 11/20/21 0539 11/21/21 0749 11/22/21 0753 11/23/21 0629 11/24/21 0454 11/25/21 0447  NA 142  --  142   < > 139 142 140 142 143  K 3.7  --  3.7   < > 4.0 4.1 4.0 4.1 4.3  CL 101  --  102   < > 101 102 101 104 105  CO2 31  --  33*   < > 32 33* 32 32 33*  GLUCOSE 132*  --  115*   < > 131* 146* 129* 129* 139*  BUN QUANTITY NOT SUFFICIENT, UNABLE TO PERFORM TEST   < > 26*   < > 31* 42* 44* 46* 55*  CREATININE QUANTITY NOT SUFFICIENT, UNABLE TO PERFORM TEST   < > 1.09*   < > 0.94 1.08* 1.06* 0.96 1.00  CALCIUM 8.8*  --  8.6*   < > 8.9 9.0 9.0 9.4 9.3  MG 2.4  --  2.5*  --   --   --   --   --   --    < > = values in this interval not displayed.   Liver Function Tests: Recent Labs  Lab 11/18/21 1459  AST 29  ALT 24  ALKPHOS 71  BILITOT 1.1  PROT 7.7  ALBUMIN 3.8   CBG: Recent Labs  Lab 11/24/21 1619 11/24/21 2024 11/25/21 0001 11/25/21 0812 11/25/21 1241  GLUCAP 165* 130* 137*  144* 150*    Discharge time spent: greater than 30 minutes.  Signed: Loletha Grayer, MD [facility] 11/25/2021

## 2021-11-25 NOTE — TOC Transition Note (Signed)
Transition of Care [facility]) - CM/SW Discharge Note   Patient Details  Name: Jacqueline [name] MRN: [id number] Date of Birth: May 20, 1933  Transition of Care [facility]) CM/SW Contact:  Alberteen Sam, LCSW Phone Number: 11/25/2021, 1:50 PM   Clinical Narrative:     Patient will DC to: [facility] Anticipated DC date: 11/25/21 Family notified:daughter [name] Transport by: [name]  Per MD patient ready for DC to [facility]. RN, patient, patient's family, and facility notified of DC. Discharge Summary sent to facility. RN given number for report  [phone] Room 120. DC packet on chart. Ambulance transport requested for patient.  CSW signing off.  Pricilla Riffle, LCSW    Final next level of care: Skilled Nursing Facility Barriers to Discharge: No Barriers Identified   Patient Goals and CMS Choice Patient states their goals for this hospitalization and ongoing recovery are:: to go home CMS Medicare.gov Compare Post Acute Care list provided to:: Patient Represenative (must comment) ([name]) Choice offered to / list presented to : Adult Children  Discharge Placement              Patient chooses bed at: [facility] Patient to be transferred to facility by: [facility] Name of family member notified: [name] Patient and family notified of of transfer: 11/25/21  Discharge Plan and Services In-house Referral: Clinical Social Work   Post Acute Care Choice: NA                               Social Determinants of Health ([place]) Interventions     Readmission Risk Interventions     No data to display

## 2021-11-25 NOTE — Plan of Care (Signed)

## 2021-11-25 NOTE — Discharge Instructions (Signed)
Can restart aspirin in a few days if no further hemoptysis

## 2021-11-26 ENCOUNTER — Encounter: Payer: Self-pay | Admitting: Internal Medicine

## 2021-11-26 ENCOUNTER — Other Ambulatory Visit: Payer: Self-pay

## 2021-11-26 ENCOUNTER — Emergency Department: Payer: Medicare Other

## 2021-11-26 ENCOUNTER — Inpatient Hospital Stay: Payer: Medicare Other

## 2021-11-26 ENCOUNTER — Inpatient Hospital Stay
Admission: EM | Admit: 2021-11-26 | Discharge: 2021-11-29 | DRG: 280 | Disposition: A | Discharge status: Skilled Nursing Facility | Payer: Medicare Other | Source: Skilled Nursing Facility | Attending: Internal Medicine | Admitting: Internal Medicine

## 2021-11-26 ENCOUNTER — Ambulatory Visit: Payer: Medicare Other | Admitting: Family

## 2021-11-26 DIAGNOSIS — R4781 Slurred speech: Secondary | ICD-10-CM | POA: Diagnosis present

## 2021-11-26 DIAGNOSIS — I130 Hypertensive heart and chronic kidney disease with heart failure and stage 1 through stage 4 chronic kidney disease, or unspecified chronic kidney disease: Secondary | ICD-10-CM | POA: Diagnosis present

## 2021-11-26 DIAGNOSIS — Z66 Do not resuscitate: Secondary | ICD-10-CM | POA: Diagnosis present

## 2021-11-26 DIAGNOSIS — T502X5A Adverse effect of carbonic-anhydrase inhibitors, benzothiadiazides and other diuretics, initial encounter: Secondary | ICD-10-CM | POA: Diagnosis present

## 2021-11-26 DIAGNOSIS — E875 Hyperkalemia: Secondary | ICD-10-CM | POA: Diagnosis present

## 2021-11-26 DIAGNOSIS — E119 Type 2 diabetes mellitus without complications: Secondary | ICD-10-CM

## 2021-11-26 DIAGNOSIS — K219 Gastro-esophageal reflux disease without esophagitis: Secondary | ICD-10-CM | POA: Diagnosis present

## 2021-11-26 DIAGNOSIS — G8929 Other chronic pain: Secondary | ICD-10-CM | POA: Diagnosis present

## 2021-11-26 DIAGNOSIS — I5022 Chronic systolic (congestive) heart failure: Secondary | ICD-10-CM | POA: Diagnosis present

## 2021-11-26 DIAGNOSIS — Z515 Encounter for palliative care: Secondary | ICD-10-CM | POA: Diagnosis not present

## 2021-11-26 DIAGNOSIS — J9610 Chronic respiratory failure, unspecified whether with hypoxia or hypercapnia: Secondary | ICD-10-CM

## 2021-11-26 DIAGNOSIS — I482 Chronic atrial fibrillation, unspecified: Secondary | ICD-10-CM | POA: Clinically undetermined

## 2021-11-26 DIAGNOSIS — M810 Age-related osteoporosis without current pathological fracture: Secondary | ICD-10-CM | POA: Diagnosis present

## 2021-11-26 DIAGNOSIS — I5023 Acute on chronic systolic (congestive) heart failure: Secondary | ICD-10-CM | POA: Diagnosis present

## 2021-11-26 DIAGNOSIS — R471 Dysarthria and anarthria: Secondary | ICD-10-CM | POA: Diagnosis present

## 2021-11-26 DIAGNOSIS — I639 Cerebral infarction, unspecified: Secondary | ICD-10-CM | POA: Diagnosis not present

## 2021-11-26 DIAGNOSIS — E861 Hypovolemia: Secondary | ICD-10-CM | POA: Diagnosis present

## 2021-11-26 DIAGNOSIS — F32A Depression, unspecified: Secondary | ICD-10-CM | POA: Diagnosis present

## 2021-11-26 DIAGNOSIS — J9611 Chronic respiratory failure with hypoxia: Secondary | ICD-10-CM | POA: Diagnosis present

## 2021-11-26 DIAGNOSIS — Z888 Allergy status to other drugs, medicaments and biological substances status: Secondary | ICD-10-CM | POA: Diagnosis not present

## 2021-11-26 DIAGNOSIS — Z881 Allergy status to other antibiotic agents status: Secondary | ICD-10-CM

## 2021-11-26 DIAGNOSIS — Z20822 Contact with and (suspected) exposure to covid-19: Secondary | ICD-10-CM | POA: Diagnosis present

## 2021-11-26 DIAGNOSIS — Z87891 Personal history of nicotine dependence: Secondary | ICD-10-CM

## 2021-11-26 DIAGNOSIS — N179 Acute kidney failure, unspecified: Secondary | ICD-10-CM | POA: Diagnosis present

## 2021-11-26 DIAGNOSIS — Z7983 Long term (current) use of bisphosphonates: Secondary | ICD-10-CM

## 2021-11-26 DIAGNOSIS — M25512 Pain in left shoulder: Secondary | ICD-10-CM | POA: Diagnosis present

## 2021-11-26 DIAGNOSIS — I420 Dilated cardiomyopathy: Secondary | ICD-10-CM | POA: Diagnosis present

## 2021-11-26 DIAGNOSIS — N1831 Chronic kidney disease, stage 3a: Secondary | ICD-10-CM | POA: Diagnosis present

## 2021-11-26 DIAGNOSIS — E860 Dehydration: Secondary | ICD-10-CM | POA: Diagnosis present

## 2021-11-26 DIAGNOSIS — I4891 Unspecified atrial fibrillation: Secondary | ICD-10-CM | POA: Diagnosis present

## 2021-11-26 DIAGNOSIS — J449 Chronic obstructive pulmonary disease, unspecified: Secondary | ICD-10-CM | POA: Diagnosis present

## 2021-11-26 DIAGNOSIS — E039 Hypothyroidism, unspecified: Secondary | ICD-10-CM | POA: Diagnosis present

## 2021-11-26 DIAGNOSIS — R531 Weakness: Secondary | ICD-10-CM

## 2021-11-26 DIAGNOSIS — I959 Hypotension, unspecified: Secondary | ICD-10-CM | POA: Diagnosis not present

## 2021-11-26 DIAGNOSIS — R29702 NIHSS score 2: Secondary | ICD-10-CM | POA: Diagnosis present

## 2021-11-26 DIAGNOSIS — E785 Hyperlipidemia, unspecified: Secondary | ICD-10-CM | POA: Diagnosis present

## 2021-11-26 DIAGNOSIS — Z880 Allergy status to penicillin: Secondary | ICD-10-CM | POA: Diagnosis not present

## 2021-11-26 DIAGNOSIS — E1122 Type 2 diabetes mellitus with diabetic chronic kidney disease: Secondary | ICD-10-CM | POA: Diagnosis present

## 2021-11-26 DIAGNOSIS — Y92239 Unspecified place in hospital as the place of occurrence of the external cause: Secondary | ICD-10-CM | POA: Diagnosis present

## 2021-11-26 DIAGNOSIS — I214 Non-ST elevation (NSTEMI) myocardial infarction: Secondary | ICD-10-CM | POA: Diagnosis present

## 2021-11-26 DIAGNOSIS — I440 Atrioventricular block, first degree: Secondary | ICD-10-CM | POA: Diagnosis present

## 2021-11-26 DIAGNOSIS — N390 Urinary tract infection, site not specified: Secondary | ICD-10-CM | POA: Diagnosis not present

## 2021-11-26 DIAGNOSIS — H9190 Unspecified hearing loss, unspecified ear: Secondary | ICD-10-CM | POA: Diagnosis present

## 2021-11-26 DIAGNOSIS — Z885 Allergy status to narcotic agent status: Secondary | ICD-10-CM

## 2021-11-26 DIAGNOSIS — Z79890 Hormone replacement therapy (postmenopausal): Secondary | ICD-10-CM

## 2021-11-26 DIAGNOSIS — I63441 Cerebral infarction due to embolism of right cerebellar artery: Secondary | ICD-10-CM | POA: Diagnosis present

## 2021-11-26 DIAGNOSIS — Z9981 Dependence on supplemental oxygen: Secondary | ICD-10-CM

## 2021-11-26 DIAGNOSIS — N3000 Acute cystitis without hematuria: Secondary | ICD-10-CM | POA: Diagnosis present

## 2021-11-26 DIAGNOSIS — I5033 Acute on chronic diastolic (congestive) heart failure: Secondary | ICD-10-CM | POA: Diagnosis present

## 2021-11-26 DIAGNOSIS — N183 Chronic kidney disease, stage 3 unspecified: Secondary | ICD-10-CM | POA: Diagnosis present

## 2021-11-26 DIAGNOSIS — Z79899 Other long term (current) drug therapy: Secondary | ICD-10-CM

## 2021-11-26 HISTORY — DX: Acute kidney failure, unspecified: N17.9

## 2021-11-26 HISTORY — DX: Hyperkalemia: E87.5

## 2021-11-26 LAB — CBC WITH DIFFERENTIAL/PLATELET
Abs Immature Granulocytes: 0.04 10*3/uL (ref 0.00–0.07)
Basophils Absolute: 0 10*3/uL (ref 0.0–0.1)
Basophils Relative: 0 %
Eosinophils Absolute: 0.1 10*3/uL (ref 0.0–0.5)
Eosinophils Relative: 1 %
HCT: 51.5 % — ABNORMAL HIGH (ref 36.0–46.0)
Hemoglobin: 16.3 g/dL — ABNORMAL HIGH (ref 12.0–15.0)
Immature Granulocytes: 1 %
Lymphocytes Relative: 25 %
Lymphs Abs: 2 10*3/uL (ref 0.7–4.0)
MCH: 28.8 pg (ref 26.0–34.0)
MCHC: 31.7 g/dL (ref 30.0–36.0)
MCV: 91 fL (ref 80.0–100.0)
Monocytes Absolute: 0.6 10*3/uL (ref 0.1–1.0)
Monocytes Relative: 7 %
Neutro Abs: 5.5 10*3/uL (ref 1.7–7.7)
Neutrophils Relative %: 66 %
Platelets: 142 10*3/uL — ABNORMAL LOW (ref 150–400)
RBC: 5.66 MIL/uL — ABNORMAL HIGH (ref 3.87–5.11)
RDW: 15.4 % (ref 11.5–15.5)
WBC: 8.2 10*3/uL (ref 4.0–10.5)
nRBC: 0 % (ref 0.0–0.2)

## 2021-11-26 LAB — BASIC METABOLIC PANEL
Anion gap: 8 (ref 5–15)
Anion gap: 8 (ref 5–15)
BUN: 74 mg/dL — ABNORMAL HIGH (ref 8–23)
BUN: 71 mg/dL — ABNORMAL HIGH (ref 8–23)
CO2: 31 mmol/L (ref 22–32)
CO2: 30 mmol/L (ref 22–32)
Calcium: 9 mg/dL (ref 8.9–10.3)
Calcium: 8.4 mg/dL — ABNORMAL LOW (ref 8.9–10.3)
Chloride: 96 mmol/L — ABNORMAL LOW (ref 98–111)
Chloride: 99 mmol/L (ref 98–111)
Creatinine, Ser: 1.78 mg/dL — ABNORMAL HIGH (ref 0.44–1.00)
Creatinine, Ser: 1.66 mg/dL — ABNORMAL HIGH (ref 0.44–1.00)
GFR, Estimated: 27 mL/min — ABNORMAL LOW (ref >60)
GFR, Estimated: 30 mL/min — ABNORMAL LOW (ref >60)
Glucose, Bld: 150 mg/dL — ABNORMAL HIGH (ref 70–99)
Glucose, Bld: 192 mg/dL — ABNORMAL HIGH (ref 70–99)
Potassium: 5.5 mmol/L — ABNORMAL HIGH (ref 3.5–5.1)
Potassium: 4.7 mmol/L (ref 3.5–5.1)
Sodium: 135 mmol/L (ref 135–145)
Sodium: 137 mmol/L (ref 135–145)

## 2021-11-26 LAB — URINALYSIS, COMPLETE (UACMP) WITH MICROSCOPIC
Bilirubin Urine: NEGATIVE
Glucose, UA: 150 mg/dL — AB
Hgb urine dipstick: NEGATIVE
Ketones, ur: NEGATIVE mg/dL
Nitrite: NEGATIVE
Protein, ur: NEGATIVE mg/dL
Specific Gravity, Urine: 1.029 (ref 1.005–1.030)
WBC, UA: >50 WBC/hpf — ABNORMAL HIGH (ref 0–5)
pH: 5 (ref 5.0–8.0)

## 2021-11-26 LAB — BLOOD GAS, VENOUS
Acid-Base Excess: 7.3 mmol/L — ABNORMAL HIGH (ref 0.0–2.0)
Bicarbonate: 33.6 mmol/L — ABNORMAL HIGH (ref 20.0–28.0)
O2 Saturation: 61.5 %
Patient temperature: 37
pCO2, Ven: 53 mmHg (ref 44–60)
pH, Ven: 7.41 (ref 7.25–7.43)
pO2, Ven: 37 mmHg (ref 32–45)

## 2021-11-26 LAB — COMPREHENSIVE METABOLIC PANEL
ALT: 14 U/L (ref 0–44)
AST: 34 U/L (ref 15–41)
Albumin: 3.5 g/dL (ref 3.5–5.0)
Alkaline Phosphatase: 51 U/L (ref 38–126)
Anion gap: 11 (ref 5–15)
BUN: 71 mg/dL — ABNORMAL HIGH (ref 8–23)
CO2: 30 mmol/L (ref 22–32)
Calcium: 8.8 mg/dL — ABNORMAL LOW (ref 8.9–10.3)
Chloride: 93 mmol/L — ABNORMAL LOW (ref 98–111)
Creatinine, Ser: 1.83 mg/dL — ABNORMAL HIGH (ref 0.44–1.00)
GFR, Estimated: 26 mL/min — ABNORMAL LOW (ref >60)
Glucose, Bld: 156 mg/dL — ABNORMAL HIGH (ref 70–99)
Potassium: 6.1 mmol/L — ABNORMAL HIGH (ref 3.5–5.1)
Sodium: 134 mmol/L — ABNORMAL LOW (ref 135–145)
Total Bilirubin: 1.3 mg/dL — ABNORMAL HIGH (ref 0.3–1.2)
Total Protein: 7 g/dL (ref 6.5–8.1)

## 2021-11-26 LAB — LACTIC ACID, PLASMA
Lactic Acid, Venous: 0.9 mmol/L (ref 0.5–1.9)
Lactic Acid, Venous: 1.4 mmol/L (ref 0.5–1.9)

## 2021-11-26 LAB — SARS CORONAVIRUS 2 BY RT PCR: SARS Coronavirus 2 by RT PCR: NEGATIVE

## 2021-11-26 LAB — GLUCOSE, CAPILLARY
Glucose-Capillary: 162 mg/dL — ABNORMAL HIGH (ref 70–99)
Glucose-Capillary: 164 mg/dL — ABNORMAL HIGH (ref 70–99)

## 2021-11-26 LAB — TROPONIN I (HIGH SENSITIVITY)
Troponin I (High Sensitivity): 850 ng/L (ref <18)
Troponin I (High Sensitivity): 868 ng/L (ref <18)

## 2021-11-26 LAB — PROTIME-INR
INR: 0.9 (ref 0.8–1.2)
Prothrombin Time: 12.5 seconds (ref 11.4–15.2)

## 2021-11-26 LAB — PROCALCITONIN: Procalcitonin: <0.1 ng/mL

## 2021-11-26 LAB — MAGNESIUM: Magnesium: 2.7 mg/dL — ABNORMAL HIGH (ref 1.7–2.4)

## 2021-11-26 LAB — BRAIN NATRIURETIC PEPTIDE: B Natriuretic Peptide: 454.6 pg/mL — ABNORMAL HIGH (ref 0.0–100.0)

## 2021-11-26 LAB — APTT: aPTT: 31 seconds (ref 24–36)

## 2021-11-26 LAB — CBG MONITORING, ED: Glucose-Capillary: 135 mg/dL — ABNORMAL HIGH (ref 70–99)

## 2021-11-26 MED ORDER — INSULIN ASPART 100 UNIT/ML IV SOLN
5.0000 [IU] | Freq: Once | INTRAVENOUS | Status: AC
  Administered 2021-11-26: 5 [IU] via INTRAVENOUS
  Filled 2021-11-26: qty 0.05

## 2021-11-26 MED ORDER — ATORVASTATIN CALCIUM 80 MG PO TABS
80.0000 mg | ORAL_TABLET | Freq: Every day | ORAL | Status: DC
  Administered 2021-11-27 – 2021-11-29 (×3): 80 mg via ORAL
  Filled 2021-11-26 (×3): qty 1

## 2021-11-26 MED ORDER — SENNOSIDES-DOCUSATE SODIUM 8.6-50 MG PO TABS
1.0000 | ORAL_TABLET | Freq: Two times a day (BID) | ORAL | Status: DC
  Administered 2021-11-27 – 2021-11-29 (×4): 1 via ORAL
  Filled 2021-11-26 (×4): qty 1

## 2021-11-26 MED ORDER — INSULIN ASPART 100 UNIT/ML IJ SOLN
0.0000 [IU] | Freq: Three times a day (TID) | INTRAMUSCULAR | Status: DC
  Administered 2021-11-27: 2 [IU] via SUBCUTANEOUS
  Administered 2021-11-27: 3 [IU] via SUBCUTANEOUS
  Administered 2021-11-28: 2 [IU] via SUBCUTANEOUS
  Administered 2021-11-29: 3 [IU] via SUBCUTANEOUS
  Filled 2021-11-26 (×4): qty 1

## 2021-11-26 MED ORDER — LACTATED RINGERS IV BOLUS: 1000.0000 mL | Freq: Once | INTRAVENOUS | Status: DC

## 2021-11-26 MED ORDER — ASPIRIN 300 MG RE SUPP
300.0000 mg | Freq: Once | RECTAL | Status: AC
Start: 2021-11-26 — End: 2021-11-26
  Administered 2021-11-26: 300 mg via RECTAL
  Filled 2021-11-26: qty 1

## 2021-11-26 MED ORDER — SODIUM CHLORIDE 0.9 % IV SOLN
1.0000 g | INTRAVENOUS | Status: AC
  Administered 2021-11-27 – 2021-11-28 (×2): 1 g via INTRAVENOUS
  Filled 2021-11-26: qty 10
  Filled 2021-11-26: qty 1

## 2021-11-26 MED ORDER — LEVOTHYROXINE SODIUM 25 MCG PO TABS
25.0000 ug | ORAL_TABLET | Freq: Every day | ORAL | Status: DC
  Administered 2021-11-27 – 2021-11-29 (×3): 25 ug via ORAL
  Filled 2021-11-26 (×3): qty 1

## 2021-11-26 MED ORDER — SODIUM CHLORIDE 0.9 % IV BOLUS
1000.0000 mL | Freq: Once | INTRAVENOUS | Status: AC
  Administered 2021-11-26: 1000 mL via INTRAVENOUS

## 2021-11-26 MED ORDER — ONDANSETRON HCL 4 MG/2ML IJ SOLN: 4.0000 mg | Freq: Four times a day (QID) | INTRAMUSCULAR | Status: DC | PRN

## 2021-11-26 MED ORDER — NITROGLYCERIN 0.4 MG SL SUBL: 0.4000 mg | SUBLINGUAL_TABLET | SUBLINGUAL | Status: DC | PRN

## 2021-11-26 MED ORDER — ASPIRIN 81 MG PO CHEW
324.0000 mg | CHEWABLE_TABLET | Freq: Once | ORAL | Status: DC
  Filled 2021-11-26: qty 4

## 2021-11-26 MED ORDER — ASPIRIN 81 MG PO CHEW
81.0000 mg | CHEWABLE_TABLET | Freq: Every day | ORAL | Status: DC
Start: 2021-11-27 — End: 2021-11-30
  Administered 2021-11-27 – 2021-11-29 (×3): 81 mg via ORAL
  Filled 2021-11-26 (×3): qty 1

## 2021-11-26 MED ORDER — LACTATED RINGERS IV BOLUS: 500.0000 mL | Freq: Once | INTRAVENOUS | Status: DC

## 2021-11-26 MED ORDER — PANTOPRAZOLE SODIUM 40 MG PO TBEC
40.0000 mg | DELAYED_RELEASE_TABLET | Freq: Every day | ORAL | Status: DC
  Administered 2021-11-27 – 2021-11-29 (×3): 40 mg via ORAL
  Filled 2021-11-26 (×3): qty 1

## 2021-11-26 MED ORDER — PANTOPRAZOLE SODIUM 40 MG PO TBEC
40.0000 mg | DELAYED_RELEASE_TABLET | Freq: Every day | ORAL | Status: DC
Start: 2021-11-26 — End: 2021-11-26

## 2021-11-26 MED ORDER — DEXTROSE 50 % IV SOLN
1.0000 | Freq: Once | INTRAVENOUS | Status: AC
  Administered 2021-11-26: 50 mL via INTRAVENOUS
  Filled 2021-11-26: qty 50

## 2021-11-26 MED ORDER — PANTOPRAZOLE SODIUM 40 MG IV SOLR: 40.0000 mg | Freq: Every day | INTRAVENOUS | Status: DC

## 2021-11-26 MED ORDER — STROKE: EARLY STAGES OF RECOVERY BOOK: Freq: Once | Status: DC

## 2021-11-26 MED ORDER — HEPARIN (PORCINE) 25000 UT/250ML-% IV SOLN: 650.0000 [IU]/h | INTRAVENOUS | Status: DC

## 2021-11-26 MED ORDER — SODIUM CHLORIDE 0.9 % IV SOLN
1.0000 g | Freq: Once | INTRAVENOUS | Status: AC
  Administered 2021-11-26: 1 g via INTRAVENOUS
  Filled 2021-11-26: qty 10

## 2021-11-26 MED ORDER — INSULIN ASPART 100 UNIT/ML IJ SOLN
0.0000 [IU] | INTRAMUSCULAR | Status: DC
  Filled 2021-11-26: qty 1

## 2021-11-26 MED ORDER — HEPARIN BOLUS VIA INFUSION
3000.0000 [IU] | Freq: Once | INTRAVENOUS | Status: DC
  Filled 2021-11-26: qty 3000

## 2021-11-26 MED ORDER — ASPIRIN 81 MG PO TBEC: 81.0000 mg | DELAYED_RELEASE_TABLET | Freq: Every day | ORAL | Status: DC

## 2021-11-26 MED ORDER — SERTRALINE HCL 50 MG PO TABS
50.0000 mg | ORAL_TABLET | Freq: Every day | ORAL | Status: DC
  Administered 2021-11-27 – 2021-11-29 (×3): 50 mg via ORAL
  Filled 2021-11-26 (×3): qty 1

## 2021-11-26 MED ORDER — SODIUM ZIRCONIUM CYCLOSILICATE 10 G PO PACK
10.0000 g | PACK | Freq: Every day | ORAL | Status: DC
  Administered 2021-11-27: 10 g via ORAL
  Filled 2021-11-26 (×3): qty 1

## 2021-11-26 MED ORDER — ACETAMINOPHEN 325 MG PO TABS: 650.0000 mg | ORAL_TABLET | ORAL | Status: DC | PRN

## 2021-11-26 NOTE — Assessment & Plan Note (Addendum)
Daughter reported slurred speech following episode of unresponsiveness and significant lethargy on morning of 6/13.  This prompted EMS call to bring patient in for evaluation. Unknown last known well time Noncontrast CT head negative for bleed. MRI brain did not show acute infarct in the right cerebellum  --See Acute Ischemic Stroke for plan

## 2021-11-26 NOTE — Assessment & Plan Note (Addendum)
Most likely secondary to over diuresis, rule out cardiorenal syndrome Baseline serum creatinine of 1.0. Creatinine on admission 1.83 with associated uremia. Noted to have relative hypotension with SBP of 90.  Given1 L of IV fluids in the ER. --Hold off further IV fluids given low EF --Hold torsemide and Entresto --Monitor BMP daily -- Avoid nephrotoxins and hypotension Renally dose meds as indicated

## 2021-11-26 NOTE — Assessment & Plan Note (Addendum)
With AKI on admission, likely secondary to hypotension, possibly overdiuresis during recent admission.   -- BMP

## 2021-11-26 NOTE — Assessment & Plan Note (Addendum)
Stable Continue Synthroid 

## 2021-11-26 NOTE — H&P (Addendum)
History and Physical    [name] FTD:[id number] DOB: 1932/06/24 DOA: 11/26/2021 DOS: the patient was seen and examined on 11/26/2021 PCP: Tracie Harrier, MD  Patient coming from: Home  Chief Complaint:  Chief Complaint  Patient presents with   Weakness    [facility] EMS called by staff for being "clammy" and LEFT arm pain; Was discharged yesterday from hospital (admitted for [facility] CHF and [facility] resp. failure); CBG 135 upon arrival here   Most of the history was obtained from patient's daughter at the bedside. HPI: Jacqueline [name] is a 88 y.o. female with medical history significant for chronic systolic CHF with last known LVEF of 20 to 25%, COPD with chronic respiratory failure on 4 L of oxygen continuous, diabetes mellitus with complications of stage III chronic kidney disease, GERD, hypothyroidism who was discharged from the hospital on 11/25/21 and returned to the ER within 24 hours for evaluation of multiple symptoms. Patient's daughter notes that she was transported to [facility] on the evening of 06/12 and upon arrival was very lethargic and less responsive than her baseline.  It turns out she was not on oxygen and her pulse oximetry was in the low 80s but this improved once she was placed back on oxygen and she returned back to her baseline mental status. Daughter states she was called again on the morning of her admission at about 615 am and was told that her mother's pulse oximetry was low again but it looks that it eventually resolved.  She got to the skilled nursing facility at about 7:15 AM and noted that her mother was lethargic and that her speech was slurred.  She also notes that her mother became extremely diaphoretic with complaints of pain involving her left shoulder and back and later with radiation to the left arm.  She did not have chest pain initially but now in the ER she is complaining of midsternal chest pain.  She rates her pain a 5 x 10 in  intensity at its worst and it is nonradiating.  She denies having any nausea, no vomiting, no palpitations. During my evaluation she is oriented to person and place but her daughter states that her speech is still slurred.  It is unclear what her last known well time was.  She has a cough which is productive of occasional greY phlegm. She appears comfortable on her 4 L and denies any shortness of breath, no headache, no fever, no chills, no urinary frequency, no hematuria, no nocturia, no dizziness or lightheadedness. Her labs revealed elevated troponin at 850, potassium of 6.1, BUN of 71 and serum creatinine of 1.83 compared to baseline of 1.0. She received 1 L IV fluid bolus in the ER as well as aspirin and will be admitted for further evaluation. Initial CT scan of the head without contrast showed no acute intracranial abnormality. Twelve-lead EKG shows sinus rhythm with a prolonged PR interval no acute ST or T wave changes.    Review of Systems: As mentioned in the history of present illness. All other systems reviewed and are negative. Past Medical History:  Diagnosis Date   Anemia    CHF (congestive heart failure) (HCC)    Diabetes mellitus without complication ([place]) 12/07/7626   Currently no high BS.  Has come off meds.  But having episodes of low BS now.   Dilated idiopathic cardiomyopathy (HCC)    GERD (gastroesophageal reflux disease)    Hyperlipidemia    Hypertension  Hypothyroidism    Osteoporosis    Renal disorder    stage 3   Past Surgical History:  Procedure Laterality Date   BUNIONECTOMY     COLONOSCOPY     ESOPHAGOGASTRODUODENOSCOPY (EGD) WITH PROPOFOL N/A 10/15/2017   Procedure: ESOPHAGOGASTRODUODENOSCOPY (EGD) WITH PROPOFOL;  Surgeon: Lollie Sails, MD;  Location: [facility] ENDOSCOPY;  Service: Endoscopy;  Laterality: N/A;   KYPHOPLASTY N/A 06/12/2017   Procedure: [id number];  Surgeon: Hessie Knows, MD;  Location: [facility] ORS;  Service: Orthopedics;  Laterality:  N/A;   TONSILLECTOMY     Social History:  reports that she quit smoking about 37 years ago. She has never used smokeless tobacco. She reports that she does not drink alcohol and does not use drugs.  Allergies  Allergen Reactions   Ciprofloxacin    Digoxin And Related    Erythromycin    Lopid [Gemfibrozil]    Nitrofurantoin    Spironolactone Other (See Comments)    Hospitalized for dehydration   Cefuroxime Rash   Penicillins Rash    Has patient had a PCN reaction causing immediate rash, facial/tongue/throat swelling, SOB or lightheadedness with hypotension: Unknown Has patient had a PCN reaction causing severe rash involving mucus membranes or skin necrosis: No Has patient had a PCN reaction that required hospitalization: No Has patient had a PCN reaction occurring within the last 10 years: No If all of the above answers are "NO", then may proceed with Cephalosporin use.     Family History  Problem Relation Age of Onset   Premature CHD Brother     Prior to Admission medications   Medication Sig Start Date End Date Taking? Authorizing Provider  alendronate (FOSAMAX) 70 MG tablet Take 70 mg by mouth once a week. Take with a full glass of water on an empty stomach.   Yes [provider]  carvedilol (COREG) 12.5 MG tablet Take by mouth 2 (two) times daily with a meal.    Yes [provider]  ergocalciferol (VITAMIN D2) 50000 units capsule Take 50,000 Units by mouth once a week.   Yes [provider]  isosorbide mononitrate (IMDUR) 60 MG 24 hr tablet Take 0.5 tablets (30 mg total) by mouth daily. 06/05/17  Yes Sainani, Belia Heman, MD  JARDIANCE 10 MG TABS tablet Take 10 mg by mouth daily. 09/23/21  Yes [provider]  KLOR-CON M10 10 MEQ tablet Take 10 mEq by mouth daily. 07/23/17  Yes [provider]  levothyroxine (SYNTHROID, LEVOTHROID) 25 MCG tablet Take 25 mcg by mouth daily before breakfast.   Yes [provider]  pantoprazole  (PROTONIX) 40 MG tablet Take 40 mg by mouth daily.   Yes [provider]  sacubitril-valsartan (ENTRESTO) 24-26 MG Take 1 tablet by mouth 2 (two) times daily. 11/25/21  Yes Wieting, Richard, MD  sertraline (ZOLOFT) 50 MG tablet Take 50 mg by mouth daily.   Yes [provider]  torsemide (DEMADEX) 20 MG tablet Take 1 tablet (20 mg total) by mouth daily. 11/26/21  Yes Wieting, Richard, MD  glucose blood test strip USE TO CHECK BLOOD SUGARS TWICE A DAY 02/19/16   [provider]  iron polysaccharides (NIFEREX) 150 MG capsule Take 150 mg by mouth daily. Patient not taking: Reported on 11/26/2021    [provider]    Physical Exam: Vitals:   11/26/21 1430 11/26/21 1452 11/26/21 1500 11/26/21 1600  BP: (!) 94/56 (!) 114/50 99/60 (!) 121/53  Pulse: 68 68 69 70  Resp: 18 (!) 23  18 18  Temp:      TempSrc:      SpO2: 98% 99% 97% 98%  Weight:      Height:       Physical Exam Vitals and nursing note reviewed.  Constitutional:      Comments: Chronically ill-appearing  HENT:     Head: Normocephalic and atraumatic.     Nose: Nose normal.     Mouth/Throat:     Mouth: Mucous membranes are dry.  Eyes:     Pupils: Pupils are equal, round, and reactive to light.  Cardiovascular:     Rate and Rhythm: Normal rate.  Pulmonary:     Effort: Pulmonary effort is normal.     Breath sounds: Normal breath sounds.  Abdominal:     General: Abdomen is flat. Bowel sounds are normal.     Palpations: Abdomen is soft.  Musculoskeletal:        General: Normal range of motion.     Cervical back: Normal range of motion and neck supple.  Skin:    General: Skin is warm and dry.  Neurological:     General: No focal deficit present.     Mental Status: She is alert.     Motor: Weakness present.     Comments: Oriented to person and place.  Able to move all extremities.  Psychiatric:        Mood and Affect: Mood normal.        Behavior: Behavior normal.     Data  Reviewed: Relevant notes from primary care and specialist visits, past discharge summaries as available in EHR, including Care Everywhere. Prior diagnostic testing as pertinent to current admission diagnoses Updated medications and problem lists for reconciliation ED course, including vitals, labs, imaging, treatment and response to treatment Triage notes, nursing and pharmacy notes and ED provider's notes Notable results as noted in HPI Labs reviewed.  Troponin 850, sodium 134, potassium 6.1, chloride 93, bicarb 30, glucose 156, BUN 71, creatinine 1.83 compared to baseline of 1.0, calcium 8.8, total protein 7.0, albumin 3.5, AST 34, ALT 14, alkaline phosphatase 51, total bilirubin 1.3, magnesium 2.7, procalcitonin less than 0.10, BNP 454, white count 8.2, hemoglobin 16.3, hematocrit 51.5, MCV 91, platelet count 142 Urine analysis shows pyuria Respiratory viral panel is negative CT angiogram of the chest shows no evidence of pulmonary embolism. Note there is a degree of contrast mixing in the right and left branch pulmonary arteries and right lower lobe arteries which somewhat limits evaluation. Chronic complete right upper lobe atelectasis with associated bronchiectasis. Findings suggestive of pulmonary arterial hypertension. New ground-glass opacity/ground-glass nodule measuring 9 mm in the right middle lobe, which could be infectious/inflammatory. Stable other pulmonary nodules. Initial follow-up with CT at 6 months is recommended to confirm persistence.  Chest x-ray reviewed by me shows no evidence of active cardiopulmonary disease CT scan of the head without contrast shows No acute intracranial abnormality seen. Twelve-lead EKG shows sinus rhythm with prolonged PR interval. There are no new results to review at this time.  Assessment and Plan: * NSTEMI (non-ST elevated myocardial infarction) [facility]) Patient was brought into the ER for evaluation of diaphoresis, left shoulder/upper back pain and  chest pain. Initial troponin is elevated at 850 We will cycle cardiac enzymes Hold off on heparin until an acute stroke is ruled out Continue aspirin and high intensity statins Hold carvedilol for now due to relative hypotension Consult cardiology  Chronic respiratory failure (HCC) Stable Continue oxygen supplementation at 4 L  to maintain pulse oximetry greater than 92%  AKI (acute kidney injury) ([place]) Most likely secondary to over diuresis rule out cardiorenal syndrome Patient has a baseline serum creatinine of 1.0 and on admission it was 1.83 with elevated BUN levels Noted to have relative hypotension with systolic blood pressure of 90 Patient received 1 L of IV fluids in the ER Hold off further IV fluid administration due to significant cardiomyopathy with risk of worsening CHF Hold torsemide and Entresto Repeat renal parameters in a.m.  Slurred speech Daughter noted slurred speech this morning Unknown last known well time Initial CT scan of the head is negative for bleed Patient noted to have elevated troponin levels which may be due to acute stroke but also has risk factors for coronary artery disease Obtain MRI of the brain to rule out an acute stroke Speech therapy for swallow function evaluation We will consult PT/OT Continue aspirin and high intensity statin We will consult neurology if MRI is positive for stroke   Hyperkalemia Noted to have hyperkalemia without any EKG changes Patient received dextrose, insulin and a dose of Lokelma Hold oral potassium supplements as well as Entresto Repeat potassium levels  Hypothyroidism Stable Continue Synthroid  Chronic systolic CHF (congestive heart failure) ([place]) Patient has a history of chronic systolic heart failure with last known LVEF of 20 to 25% She was recently discharged from the hospital on 06/12 after treatment for acute exacerbation of her underlying systolic heart failure Not acutely exacerbated at this  time Hold torsemide, Entresto and carvedilol Monitor volume status closely  CKD stage 3 due to type 2 diabetes mellitus ([place]) Patient has a history of diabetes mellitus type 2 with complications of stage III chronic kidney disease Keep n.p.o. for now until swallow function has been evaluated Check blood sugars every 4 hours  Depression Stable Continue sertraline  UTI (urinary tract infection) Patient noted to have pyuria but denies having any urinary symptoms She has been lethargic She received 1 dose of Rocephin 1 g IV in the ER We will treat with 2 more doses of IV Rocephin Follow-up results of urine culture       Advance Care Planning:   Code Status: DNR   Consults: Cardiology, palliative care  Family Communication: Greater than 50% of time was spent discussing patient's condition and plan of care with her daughter Mena Pauls at the bedside.  All questions and concerns have been addressed.  She verbalizes understanding and agrees with the plan.  CODE STATUS was discussed and patient is a DNR  Severity of Illness: The appropriate patient status for this patient is INPATIENT. Inpatient status is judged to be reasonable and necessary in order to provide the required intensity of service to ensure the patient's safety. The patient's presenting symptoms, physical exam findings, and initial radiographic and laboratory data in the context of their chronic comorbidities is felt to place them at high risk for further clinical deterioration. Furthermore, it is not anticipated that the patient will be medically stable for discharge from the hospital within 2 midnights of admission.   * I certify that at the point of admission it is my clinical judgment that the patient will require inpatient hospital care spanning beyond 2 midnights from the point of admission due to high intensity of service, high risk for further deterioration and high frequency of surveillance  required.*  Author: Collier Bullock, MD 11/26/2021 4:18 PM  For on call review [email or web address].

## 2021-11-26 NOTE — Assessment & Plan Note (Addendum)
Presented for evaluation of diaphoresis, left shoulder/upper back pain and chest pain. Initial troponin is elevated at 850. --Cardiology is consulted, follow-up recommendations -- Heparin infusion deferred given acute stroke --Continue aspirin and statin -- Coreg held during admission for permissive hypertension, holding at discharge to avoid hypotension

## 2021-11-26 NOTE — Assessment & Plan Note (Addendum)
Stable Continue oxygen supplementation at 4 L to maintain pulse oximetry greater than 92%

## 2021-11-26 NOTE — Assessment & Plan Note (Addendum)
Patient has a history of chronic systolic heart failure with LVEF of 20 to 25% on most recent echo. Patient had just been discharged from the hospital on 06/12, after diuresis for acute decompensation.   Given IV fluids in the ER due to hypotension and appearing hypovolemic. --Hold torsemide, Entresto, Imdur and Coreg --Since BPs overall are controlled and need to strictly avoid hypotension in the setting of her stroke, above meds are also held at time of discharge --Close cardiology follow-up in 1 to 2 weeks -- Daily weights

## 2021-11-26 NOTE — ED Provider Notes (Signed)
[facility] Provider Note    Event Date/Time   First MD Initiated Contact with Patient 11/26/21 1218     (approximate)   History   Weakness (Patient from [facility]; EMS called by staff for being "clammy" and LEFT arm pain; Was discharged yesterday from hospital (admitted for [facility] CHF and AonC resp. failure); CBG 135 upon arrival here)   HPI  Jacqueline [name] is a 88 y.o. female past medical history of HTN, HDL, GERD, DM, CHF, anemia and CKD with several recent admissions most recently discharged yesterday after being hospitalized for acute on chronic hypoxic respiratory failure and CHF chronically on 4 L nasal cannula discharged on torsemide and clindamycin for possible pneumonia who presents via EMS from nursing facility for evaluation of chest pain and generalized weakness.  Patient is also endorsing some mild epigastric discomfort.  Her daughter is at bedside who provides a significant amount history given a mild language barrier and some slurred speech.  Her daughter relates that when she got to the nursing facility yesterday after being discharged she was without her oxygen for at least couple minutes and her SPO2 got as low as 70% and she was unresponsive for several minutes while they were sorting it out.  Eventually able to get reconnected and get her oxygen back up into the 90s but when she woke up her daughter noticed her speech was slurred.  She states that she was told by staff facility that patient seemed lethargic and extremely weak today.  No interim falls since she was discharged reports of fever, increased cough for proxy today.  Resident endorsing some chest tightness epigastric discomfort patient denies any other acute complaints including back pain, extremity pain or focal weakness and headache.    Past Medical History:  Diagnosis Date   Anemia    CHF (congestive heart failure) (HCC)    Diabetes mellitus without complication ([place]) 47/65/4650    Currently no high BS.  Has come off meds.  But having episodes of low BS now.   Dilated idiopathic cardiomyopathy (HCC)    GERD (gastroesophageal reflux disease)    Hyperlipidemia    Hypertension    Hypothyroidism    Osteoporosis    Renal disorder    stage 3     Physical Exam  Triage Vital Signs: ED Triage Vitals  Enc Vitals Group     BP 11/26/21 1214 (!) 113/54     Pulse Rate 11/26/21 1214 69     Resp 11/26/21 1214 15     Temp 11/26/21 1214 97.8 F (36.6 C)     Temp Source 11/26/21 1214 Oral     SpO2 11/26/21 1212 98 %     Weight --      Height --      Head Circumference --      Peak Flow --      Pain Score --      Pain Loc --      Pain Edu? --      Excl. in [place]? --     Most recent vital signs: Vitals:   11/26/21 1430 11/26/21 1452  BP: (!) 94/56 (!) 114/50  Pulse: 68 68  Resp: 18 (!) 23  Temp:    SpO2: 98% 99%    General: Awake, no distress.  CV:  Good peripheral perfusion.  2+ radial pulses. Resp:  Normal effort.  Slightly diminished bilaterally. Abd:  No distention.  Soft. Other:  Speech is slurred.  Cranial  nerves II through XII are grossly intact.  No clear pronator drift or finger dysmetria.  Patient is slightly weak in the left lower extremity compared to the right but otherwise has full strength in the right lower extremity bilateral upper extremities.  Per daughter at bedside this is baseline.  Patient is oriented to year which is also baseline per daughter.  Sensation is intact to light touch in all extremities.   ED Results / Procedures / Treatments  Labs (all labs ordered are listed, but only abnormal results are displayed) Labs Reviewed  CBC WITH DIFFERENTIAL/PLATELET - Abnormal; Notable for the following components:      Result Value   RBC 5.66 (*)    Hemoglobin 16.3 (*)    HCT 51.5 (*)    Platelets 142 (*)    All other components within normal limits  COMPREHENSIVE METABOLIC PANEL - Abnormal; Notable for the following components:   Sodium  134 (*)    Potassium 6.1 (*)    Chloride 93 (*)    Glucose, Bld 156 (*)    BUN 71 (*)    Creatinine, Ser 1.83 (*)    Calcium 8.8 (*)    Total Bilirubin 1.3 (*)    GFR, Estimated 26 (*)    All other components within normal limits  BRAIN NATRIURETIC PEPTIDE - Abnormal; Notable for the following components:   B Natriuretic Peptide 454.6 (*)    All other components within normal limits  MAGNESIUM - Abnormal; Notable for the following components:   Magnesium 2.7 (*)    All other components within normal limits  BLOOD GAS, VENOUS - Abnormal; Notable for the following components:   Bicarbonate 33.6 (*)    Acid-Base Excess 7.3 (*)    All other components within normal limits  URINALYSIS, COMPLETE (UACMP) WITH MICROSCOPIC - Abnormal; Notable for the following components:   Color, Urine YELLOW (*)    APPearance HAZY (*)    Glucose, UA 150 (*)    Leukocytes,Ua MODERATE (*)    WBC, UA >50 (*)    Bacteria, UA RARE (*)    All other components within normal limits  CBG MONITORING, ED - Abnormal; Notable for the following components:   Glucose-Capillary 135 (*)    All other components within normal limits  TROPONIN I (HIGH SENSITIVITY) - Abnormal; Notable for the following components:   Troponin I (High Sensitivity) 850 (*)    All other components within normal limits  SARS CORONAVIRUS 2 BY RT PCR  URINE CULTURE  CULTURE, BLOOD (ROUTINE X 2)  CULTURE, BLOOD (ROUTINE X 2)  PROCALCITONIN  LACTIC ACID, PLASMA  LACTIC ACID, PLASMA  PROTIME-INR  APTT  BASIC METABOLIC PANEL  GLUCOSE, RANDOM  TROPONIN I (HIGH SENSITIVITY)     EKG  ECG is remarkable for sinus rhythm with a first-degree block and a PR interval of 264 with otherwise unremarkable intervals, left anterior fascicle block and some nonspecific ST changes in lead II, lead I, aVL, lead III as well as lateral leads   RADIOLOGY CT head on my interpretation without evidence of acute ischemia, hemorrhage, edema, mass effect or  other clear acute process.  I also reviewed radiology's interpretation.  Chest x-ray shows some patchiness around the bilateral hilar without any other clear focal consolidation, effusion, edema, pneumothorax or other clear acute thoracic process.  PROCEDURES:  Critical Care performed: Yes, see critical care procedure note(s)  .1-3 Lead EKG Interpretation  Performed by: Lucrezia Starch, MD Authorized by: Lucrezia Starch, MD  Interpretation: normal     ECG rate assessment: normal     Rhythm: sinus rhythm     Ectopy: none     Conduction: abnormal     Abnormal conduction: 1st degree AV block   .Critical Care  Performed by: Lucrezia Starch, MD Authorized by: Lucrezia Starch, MD   Critical care provider statement:    Critical care time (minutes):  30   Critical care was necessary to treat or prevent imminent or life-threatening deterioration of the following conditions:  Sepsis   Critical care was time spent personally by me on the following activities:  Development of treatment plan with patient or surrogate, discussions with consultants, evaluation of patient's response to treatment, examination of patient, ordering and review of laboratory studies, ordering and review of radiographic studies, ordering and performing treatments and interventions, pulse oximetry, re-evaluation of patient's condition and review of old charts   The patient is on the cardiac monitor to evaluate for evidence of arrhythmia and/or significant heart rate changes.   MEDICATIONS ORDERED IN ED: Medications  cefTRIAXone (ROCEPHIN) 1 g in sodium chloride 0.9 % 100 mL IVPB (1 g Intravenous New Bag/Given 11/26/21 1452)  sodium zirconium cyclosilicate (LOKELMA) packet 10 g (has no administration in time range)  insulin aspart (novoLOG) injection 5 Units (has no administration in time range)    And  dextrose 50 % solution 50 mL (has no administration in time range)  nitroGLYCERIN (NITROSTAT) SL tablet 0.4 mg  (has no administration in time range)  acetaminophen (TYLENOL) tablet 650 mg (has no administration in time range)  ondansetron (ZOFRAN) injection 4 mg (has no administration in time range)  atorvastatin (LIPITOR) tablet 80 mg (has no administration in time range)   stroke: early stages of recovery book (has no administration in time range)  senna-docusate (Senokot-S) tablet 1 tablet (has no administration in time range)  pantoprazole (PROTONIX) injection 40 mg (has no administration in time range)  aspirin suppository 300 mg (has no administration in time range)  sodium chloride 0.9 % bolus 1,000 mL (1,000 mLs Intravenous New Bag/Given 11/26/21 1451)     IMPRESSION / MDM / ASSESSMENT AND PLAN / ED COURSE  I reviewed the triage vital signs and the nursing notes. Patient's presentation is most consistent with acute presentation with potential threat to life or bodily function.                               Differential diagnosis includes, but is not limited to ACS, pneumonia, UTI, anemia, arrhythmia, metabolic derangements, possible anoxic injury from hypoxia reported yesterday given slurred speech as other CVA.  ECG is remarkable for sinus rhythm with a first-degree block and a PR interval of 264 with otherwise unremarkable intervals, left anterior fascicle block and some nonspecific ST changes in lead II, lead I, aVL, lead III as well as lateral leads  CT head on my interpretation without evidence of acute ischemia, hemorrhage, edema, mass effect or other clear acute process.  I also reviewed radiology's interpretation.  Chest x-ray shows some patchiness around the bilateral hilar without any other clear focal consolidation, effusion, edema, pneumothorax or other clear acute thoracic process.  CBC without leukocytosis or acute anemia.  Platelets are 142.  CMP is marked for K of 6.1, glucose of 156 and a creatinine of 1.3 compared to 1 yesterday.  BUN is 71.  Initial troponin elevated at 850  compared to 57 8 days  ago.  This concerning for an NSTEMI.  BNP only slightly elevated at 454 compared to 1678 days ago.  Patient does not otherwise appear volume overloaded I do not think she is having an acute CHF exacerbation at this time.  Procalcitonin is undetectable.  Magnesium 2.7.  VBG with a pH of 7.4 with a PCO2 of 53 and bicarb of 33.6 not suggestive of acute hypercarbic respiratory failure.  COVID PCR is negative.  UA is moderate leukocyte esterase greater than 50 WBCs and some bacteria.  Urine culture sent.  Regard to NSTEMI we will give ASA.  We will also obtain MR brain to assess for possible CVA given slurred speech.  If this is negative I think patient will need to be started on heparin for NSTEMI.  She also does appear dehydrated on her labs and exam possibly related to overdiuresis versus sepsis.  We will obtain blood and urine cultures and gently hydrate given her history of a reduced EF.  I discussed this with admitting hospitalist will place admission orders.  Patient started on Rocephin.  Family updated at bedside.       FINAL CLINICAL IMPRESSION(S) / ED DIAGNOSES   Final diagnoses:  Weakness  NSTEMI (non-ST elevated myocardial infarction) ([place])  Acute cystitis without hematuria  Slurred speech  AKI (acute kidney injury) ([place])     Rx / DC Orders   ED Discharge Orders     None        Note:  This document was prepared using Dragon voice recognition software and may include unintentional dictation errors.   Lucrezia Starch, MD 11/26/21 [phone]

## 2021-11-26 NOTE — Assessment & Plan Note (Addendum)
Noted to have hyperkalemia (K6.1) without any EKG changes.  Treated in the ED with dextrose, insulin and a dose of Lokelma. --Hold oral potassium supplements as well as Entresto -- Repeat BMP in 1 week

## 2021-11-26 NOTE — Assessment & Plan Note (Addendum)
Stable Continue sertraline 

## 2021-11-26 NOTE — Assessment & Plan Note (Addendum)
UA on admission with pyuria but patient denied having any urinary symptoms. Given Rocephin 1 g IV in the ER. Continued and completed course of Rocephin Urine culture grew Citrobacter braakii resistant only to cefazolin

## 2021-11-27 ENCOUNTER — Inpatient Hospital Stay: Payer: Medicare Other

## 2021-11-27 ENCOUNTER — Inpatient Hospital Stay
Admit: 2021-11-27 | Discharge: 2021-11-27 | Disposition: A | Discharge status: Home / Self Care | Payer: Medicare Other | Attending: Internal Medicine | Admitting: Internal Medicine

## 2021-11-27 DIAGNOSIS — R531 Weakness: Secondary | ICD-10-CM | POA: Diagnosis not present

## 2021-11-27 DIAGNOSIS — I482 Chronic atrial fibrillation, unspecified: Secondary | ICD-10-CM | POA: Clinically undetermined

## 2021-11-27 DIAGNOSIS — N179 Acute kidney failure, unspecified: Secondary | ICD-10-CM | POA: Diagnosis not present

## 2021-11-27 DIAGNOSIS — I639 Cerebral infarction, unspecified: Secondary | ICD-10-CM | POA: Diagnosis not present

## 2021-11-27 DIAGNOSIS — R471 Dysarthria and anarthria: Secondary | ICD-10-CM

## 2021-11-27 DIAGNOSIS — N390 Urinary tract infection, site not specified: Secondary | ICD-10-CM

## 2021-11-27 DIAGNOSIS — I4891 Unspecified atrial fibrillation: Secondary | ICD-10-CM

## 2021-11-27 LAB — LIPID PANEL
Cholesterol: 262 mg/dL — ABNORMAL HIGH (ref 0–200)
HDL: 34 mg/dL — ABNORMAL LOW (ref >40)
LDL Cholesterol: 165 mg/dL — ABNORMAL HIGH (ref 0–99)
Total CHOL/HDL Ratio: 7.7 RATIO
Triglycerides: 315 mg/dL — ABNORMAL HIGH (ref <150)
VLDL: 63 mg/dL — ABNORMAL HIGH (ref 0–40)

## 2021-11-27 LAB — BASIC METABOLIC PANEL
Anion gap: 12 (ref 5–15)
BUN: 66 mg/dL — ABNORMAL HIGH (ref 8–23)
CO2: 25 mmol/L (ref 22–32)
Calcium: 8.8 mg/dL — ABNORMAL LOW (ref 8.9–10.3)
Chloride: 100 mmol/L (ref 98–111)
Creatinine, Ser: 1.34 mg/dL — ABNORMAL HIGH (ref 0.44–1.00)
GFR, Estimated: 38 mL/min — ABNORMAL LOW (ref >60)
Glucose, Bld: 117 mg/dL — ABNORMAL HIGH (ref 70–99)
Potassium: 4.6 mmol/L (ref 3.5–5.1)
Sodium: 137 mmol/L (ref 135–145)

## 2021-11-27 LAB — CBC
HCT: 49.6 % — ABNORMAL HIGH (ref 36.0–46.0)
Hemoglobin: 15.8 g/dL — ABNORMAL HIGH (ref 12.0–15.0)
MCH: 29.6 pg (ref 26.0–34.0)
MCHC: 31.9 g/dL (ref 30.0–36.0)
MCV: 92.9 fL (ref 80.0–100.0)
Platelets: 119 10*3/uL — ABNORMAL LOW (ref 150–400)
RBC: 5.34 MIL/uL — ABNORMAL HIGH (ref 3.87–5.11)
RDW: 15 % (ref 11.5–15.5)
WBC: 7.2 10*3/uL (ref 4.0–10.5)
nRBC: 0 % (ref 0.0–0.2)

## 2021-11-27 LAB — TROPONIN I (HIGH SENSITIVITY)
Troponin I (High Sensitivity): 296 ng/L (ref <18)
Troponin I (High Sensitivity): 261 ng/L (ref <18)

## 2021-11-27 LAB — GLUCOSE, CAPILLARY
Glucose-Capillary: 122 mg/dL — ABNORMAL HIGH (ref 70–99)
Glucose-Capillary: 172 mg/dL — ABNORMAL HIGH (ref 70–99)
Glucose-Capillary: 107 mg/dL — ABNORMAL HIGH (ref 70–99)
Glucose-Capillary: 132 mg/dL — ABNORMAL HIGH (ref 70–99)

## 2021-11-27 LAB — MAGNESIUM: Magnesium: 2.6 mg/dL — ABNORMAL HIGH (ref 1.7–2.4)

## 2021-11-27 NOTE — Assessment & Plan Note (Addendum)
Patient was noted in A-fib around 4:20 AM morning following admission.  No known prior history per chart review.  Most likely her stroke was embolic secondary to underlying A-fib. -- Monitor on telemetry -- Cardiology consulted -- We have held off anticoagulation given acute stroke... per Neurology, start anticoagulation on 6/17.  Patient meets criteria for reduced dose of Eliquis -- Start Eliquis 2.5 mg p.o. twice daily tomorrow 6/17 -- Follow-up with cardiology

## 2021-11-27 NOTE — Consult Note (Addendum)
NEUROLOGY CONSULTATION NOTE   Date of service: November 27, 2021 Patient Name: Jacqueline [name] MRN:  [id number] DOB:  07-12-1932 Reason for consult: R cerebellar stroke Requesting physician: Dr. Nicole Kindred _ _ _   _ __   _ __ _ _  __ __   _ __   __ _  History of Present Illness   This is a 88 year old patient with a past medical history significant for CHF, diabetes, hypertension, hyperlipidemia who is admitted for lethargy and slurred speech.  She was actually admitted last week for heart failure exacerbation and was discharged on June 12.  Upon arrival to [facility] after discharge she was noted to be lethargic and was oriented than at baseline and was found to be hypoxic secondary to an issue with the oxygen tubing where she was not getting any oxygen actually delivered.  She is on 4 L of O2 at baseline.  Her mental status improved when the oxygen tubing was replaced. The following day she was again noted to be lethargic with new onset slurred speech.  She was briefly hypoxic to the 80s during that event but mental status did not improve when O2 sat normalized. In the ED she was noted to have lethargy and dysarthria as well as midsternal chest pain radiating to her LUE and diaphoresis. Troponins yesterday were 850 and 868. She went into a fib overnight which is a new dx for her. Cardiology is scheduled to see her today.  UA c/f infection. Patient is on ceftriaxone for this.   MRI brain without contrast showed acute infarct R cerebellum, personal review. Vascular imaging has not been performed. Other stroke workup:  TTE 11/18/21 (prior admission 10 days ago) Left ventricular ejection fraction, by estimation, is 20 to 25%. The left ventricle has severely decreased function. The left ventricle demonstrates global hypokinesis. The left ventricular internal cavity size was mildly dilated. There is severe left ventricular hypertrophy. Left ventricular diastolic parameters are consistent with Grade  II diastolic dysfunction (pseudonormalization). 2. Right ventricular systolic function is normal. The right ventricular size is normal. 3. Left atrial size was mildly dilated. 4. The mitral valve is normal in structure. Mild mitral valve regurgitation. 5. The aortic valve is normal in structure. Aortic valve regurgitation is trivial.  Stroke Labs  No results found for: "CHOL", "TRIG", "HDL", "CHOLHDL", "VLDL", "LDLCALC", "LDLDIRECT", "LABVLDL"  Lab Results  Component Value Date/Time   HGBA1C 6.5 (H) 11/18/2021 02:59 PM   At baseline patient walks with a walker. She was discharged to [facility] after last admission 6/12 but prior to that she lived at home, ambulated with a walker, and required assistance during the day only from daughter for cooking, cleaning, and shopping. Per daughter patient has not had any recent falls.   ROS   Per HPI: all other systems reviewed and are negative  Past History   I have reviewed the following:  Past Medical History:  Diagnosis Date   Anemia    CHF (congestive heart failure) ([place])    Diabetes mellitus without complication ([place]) 62/08/5595   Currently no high BS.  Has come off meds.  But having episodes of low BS now.   Dilated idiopathic cardiomyopathy (HCC)    GERD (gastroesophageal reflux disease)    Hyperlipidemia    Hypertension    Hypothyroidism    Osteoporosis    Renal disorder    stage 3   Past Surgical History:  Procedure Laterality Date   BUNIONECTOMY  COLONOSCOPY     ESOPHAGOGASTRODUODENOSCOPY (EGD) WITH PROPOFOL N/A 10/15/2017   Procedure: ESOPHAGOGASTRODUODENOSCOPY (EGD) WITH PROPOFOL;  Surgeon: Lollie Sails, MD;  Location: [facility] ENDOSCOPY;  Service: Endoscopy;  Laterality: N/A;   KYPHOPLASTY N/A 06/12/2017   Procedure: [id number];  Surgeon: Hessie Knows, MD;  Location: [facility] ORS;  Service: Orthopedics;  Laterality: N/A;   TONSILLECTOMY     Family History  Problem Relation Age of Onset   Premature CHD Brother     Social History   Socioeconomic History   Marital status: Widowed    Spouse name: Not on file   Number of children: Not on file   Years of education: Not on file   Highest education level: Not on file  Occupational History   Not on file  Tobacco Use   Smoking status: Former    Types: Cigarettes    Quit date: 04/18/1984    Years since quitting: 37.6   Smokeless tobacco: Never  Vaping Use   Vaping Use: Former   Start date: 05/16/1965   Quit date: 05/16/1989  Substance and Sexual Activity   Alcohol use: No   Drug use: No   Sexual activity: Never  Other Topics Concern   Not on file  Social History Narrative   Not on file   Social Determinants of Health   Financial Resource Strain: Not on file  Food Insecurity: Not on file  Transportation Needs: Not on file  Physical Activity: Not on file  Stress: Not on file  Social Connections: Not on file   Allergies  Allergen Reactions   Ciprofloxacin    Digoxin And Related    Erythromycin    Lopid [Gemfibrozil]    Nitrofurantoin    Spironolactone Other (See Comments)    Hospitalized for dehydration   Cefuroxime Rash   Penicillins Rash    Has patient had a PCN reaction causing immediate rash, facial/tongue/throat swelling, SOB or lightheadedness with hypotension: Unknown Has patient had a PCN reaction causing severe rash involving mucus membranes or skin necrosis: No Has patient had a PCN reaction that required hospitalization: No Has patient had a PCN reaction occurring within the last 10 years: No If all of the above answers are "NO", then may proceed with Cephalosporin use.     Medications   Medications Prior to Admission  Medication Sig Dispense Refill Last Dose   alendronate (FOSAMAX) 70 MG tablet Take 70 mg by mouth once a week. Take with a full glass of water on an empty stomach.   11/25/2021 at 0900   carvedilol (COREG) 12.5 MG tablet Take by mouth 2 (two) times daily with a meal.    11/26/2021 at 0800    ergocalciferol (VITAMIN D2) 50000 units capsule Take 50,000 Units by mouth once a week.   11/25/2021 at 0900   isosorbide mononitrate (IMDUR) 60 MG 24 hr tablet Take 0.5 tablets (30 mg total) by mouth daily. 30 tablet 1 11/26/2021 at 0900   JARDIANCE 10 MG TABS tablet Take 10 mg by mouth daily.   11/25/2021 at 2000   KLOR-CON M10 10 MEQ tablet Take 10 mEq by mouth daily.   11/26/2021 at 0900   levothyroxine (SYNTHROID, LEVOTHROID) 25 MCG tablet Take 25 mcg by mouth daily before breakfast.   11/26/2021 at 0600   pantoprazole (PROTONIX) 40 MG tablet Take 40 mg by mouth daily.   11/26/2021 at 0900   sacubitril-valsartan (ENTRESTO) 24-26 MG Take 1 tablet by mouth 2 (two) times daily. 60 tablet 0 11/26/2021 at 0900  sertraline (ZOLOFT) 50 MG tablet Take 50 mg by mouth daily.   11/26/2021 at 0900   torsemide (DEMADEX) 20 MG tablet Take 1 tablet (20 mg total) by mouth daily. 30 tablet 0 11/26/2021 at 0900   glucose blood test strip USE TO CHECK BLOOD SUGARS TWICE A DAY      iron polysaccharides (NIFEREX) 150 MG capsule Take 150 mg by mouth daily. (Patient not taking: Reported on 11/26/2021)   Not Taking      Current Facility-Administered Medications:     stroke: early stages of recovery book, , Does not apply, Once, Agbata, Tochukwu, MD   acetaminophen (TYLENOL) tablet 650 mg, 650 mg, Oral, Q4H PRN, Agbata, Tochukwu, MD   aspirin chewable tablet 81 mg, 81 mg, Oral, Daily, Agbata, Tochukwu, MD, 81 mg at 11/27/21 0909   atorvastatin (LIPITOR) tablet 80 mg, 80 mg, Oral, Daily, Agbata, Tochukwu, MD, 80 mg at 11/27/21 0909   cefTRIAXone (ROCEPHIN) 1 g in sodium chloride 0.9 % 100 mL IVPB, 1 g, Intravenous, Q24H, Agbata, Tochukwu, MD   insulin aspart (novoLOG) injection 0-15 Units, 0-15 Units, Subcutaneous, TID WC, Agbata, Tochukwu, MD, 3 Units at 11/27/21 1238   levothyroxine (SYNTHROID) tablet 25 mcg, 25 mcg, Oral, QAC breakfast, Agbata, Tochukwu, MD, 25 mcg at 11/27/21 0601   nitroGLYCERIN (NITROSTAT) SL tablet  0.4 mg, 0.4 mg, Sublingual, Q5 Min x 3 PRN, Agbata, Tochukwu, MD   ondansetron (ZOFRAN) injection 4 mg, 4 mg, Intravenous, Q6H PRN, Agbata, Tochukwu, MD   pantoprazole (PROTONIX) EC tablet 40 mg, 40 mg, Oral, Daily, Agbata, Tochukwu, MD, 40 mg at 11/27/21 0909   senna-docusate (Senokot-S) tablet 1 tablet, 1 tablet, Oral, BID, Agbata, Tochukwu, MD, 1 tablet at 11/27/21 0909   sertraline (ZOLOFT) tablet 50 mg, 50 mg, Oral, Daily, Agbata, Tochukwu, MD, 50 mg at 11/27/21 0909   sodium zirconium cyclosilicate (LOKELMA) packet 10 g, 10 g, Oral, Daily, Agbata, Tochukwu, MD, 10 g at 11/27/21 1120  Vitals   Vitals:   11/26/21 2342 11/27/21 0426 11/27/21 0742 11/27/21 1219  BP: (!) 110/55 (!) 99/47 101/67 (!) 112/52  Pulse: 68 68 72 79  Resp: '20 20 18 20  '$ Temp: 97.6 F (36.4 C) 97.9 F (36.6 C) 98.8 F (37.1 C) 98.2 F (36.8 C)  TempSrc:    Oral  SpO2: 98% 98% 99% 95%  Weight:      Height:         Body mass index is 29.05 kg/m.  Physical Exam   Physical Exam Gen: alert, oriented to self, hospital, daughter, age, month, but not year. HEENT: Atraumatic, normocephalic;mucous membranes moist; oropharynx clear, tongue without atrophy or fasciculations. Neck: Supple, trachea midline. Resp: CTAB, no w/r/r CV: RRR, no m/g/r; nml S1 and S2. 2+ symmetric peripheral pulses. Abd: soft/NT/ND; nabs x 4 quad Extrem: Nml bulk; no cyanosis, clubbing, or edema.  Neuro: *MS: alert, oriented to self, hospital, daughter, age, month, but not year. Follows some simple commands but frequently gets confused while doing so *Speech: mild dysarthria, mildly impaired naming and repetition  *CN:    I: Deferred   II,III: PERRLA, blinks to threat bilat, optic discs unable to be visualized 2/2 pupillary constriction   III,IV,VI: EOMI w/o nystagmus, no ptosis   V: Sensation intact from V1 to V3 to LT   VII: Eyelid closure was full.  Smile symmetric.   VIII: Hearing intact to voice   IX,X: Voice normal, palate  elevates symmetrically    XI: SCM/trap 5/5 bilat   XII: Tongue  protrudes midline, no atrophy or fasciculations   *Motor:   Normal bulk.  No tremor, rigidity or bradykinesia. No pronator drift. 5/5 strength throughout in all extremities, symmetric *Sensory: SILT. Symmetric. Propioception intact bilat.  No double-simultaneous extinction.  *Coordination:  FNF intact bilat *Reflexes:  1+ and symmetric throughout without clonus; toes down-going bilat *Gait: deferred  NIHSS  1a Level of Conscious.: 0 1b LOC Questions: 0 1c LOC Commands: 0 2 Best Gaze: 0 3 Visual: 0 4 Facial Palsy: 0 5a Motor Arm - left: 0 5b Motor Arm - Right: 0 6a Motor Leg - Left: 0 6b Motor Leg - Right: 0 7 Limb Ataxia: 0 8 Sensory: 0 9 Best Language: 1 10 Dysarthria: 0 11 Extinct. and Inatten.: 0  TOTAL: 2   Premorbid mRS = 2 (walked with a walker, independent in simple ADLs but daughter assisted with cooking, cleaning, and shopping)   Labs   CBC:  Recent Labs  Lab 11/26/21 1223 11/27/21 0613  WBC 8.2 7.2  NEUTROABS 5.5  --   HGB 16.3* 15.8*  HCT 51.5* 49.6*  MCV 91.0 92.9  PLT 142* 119*    Basic Metabolic Panel:  Lab Results  Component Value Date   NA 137 11/26/2021   K 4.7 11/26/2021   CO2 30 11/26/2021   GLUCOSE 192 (H) 11/26/2021   BUN 71 (H) 11/26/2021   CREATININE 1.66 (H) 11/26/2021   CALCIUM 8.4 (L) 11/26/2021   GFRNONAA 30 (L) 11/26/2021   GFRAA 52 (L) 02/11/2019   Lipid Panel: No results found for: "LDLCALC" HgbA1c:  Lab Results  Component Value Date   HGBA1C 6.5 (H) 11/18/2021   Urine Drug Screen: No results found for: "LABOPIA", "COCAINSCRNUR", "LABBENZ", "AMPHETMU", "THCU", "LABBARB"  Alcohol Level No results found for: "ETH"   Impression   This is a 88 year old patient with a past medical history significant for CHF, diabetes, hypertension, hyperlipidemia who is admitted for lethargy and slurred speech and found to have an acute ischemic R cerebellar stroke. She  was also noted to be in a fib overnight which is a new dx for her. Cardiology is scheduled to see her today. She will need anticoagulation for this, see below, although suggest waiting until day 5 from sx onset to reduce the risk of hemorrhagic conversion of acute ischemic stroke. She is altered which is favored to be 2/2 UTI.  Recommendations   - Continue aspirin '81mg'$  daily - Recommend starting eliquis for secondary stroke prevention in the setting of a fib 5 days after sx onset (11/30/21). If she is still in the hospital she should get a head CT that day prior to restart to r/o interval hemorrhagic conversion. If she does not have a cardiology indication for continuation of antiplatelet in the setting of therapeutic anticoagulation, aspirin should be stopped when eliquis is restarted. If she does, OK to continue aspirin after eliquis restart. She does not need to stay in the hospital until anticoagulation is restarted, this may be done at the facility if she is medically ready to be discharged before 11/30/21, although discharge instructions should note that eliquis should not be started if patient has new or worsening neurologic deficits - Permissive HTN x48 hrs from sx onset goal BP <220/110. PRN labetalol or hydralazine if BP above these parameters. Avoid oral antihypertensives. After 48 hrs, goal is normotension; strict avoidance of hypotension - F/u LDL - Continue atorvastatin '80mg'$  daily - Cardiology consult pending; appreciate recs - MRA H&N - TTE performed 11/18/21, no indication to  repeat - q4 hr neuro checks - STAT head CT for any change in neuro exam - Tele - PT/OT/SLP - Stroke education - Amb referral to neurology upon discharge   ______________________________________________________________________   Thank you for the opportunity to take part in the care of this patient. If you have any further questions, please contact the neurology consultation attending.  Signed,  Su Monks, MD [facility] [phone]  If 7pm- 7am, please page neurology on call as listed in [place].

## 2021-11-27 NOTE — Progress Notes (Signed)
Noted conversion to Afib rhythm at around 0422H (called in by CCMD). Pt was asleep when it happened. Upon assessment, pt denies palpitations, chest pain or headache. Informed NP [place]. 12L EKG done. Pt appears comfortable, conversant.

## 2021-11-27 NOTE — Evaluation (Signed)
Occupational Therapy Evaluation Patient Details Name: Jacqueline [name] MRN: [id number] DOB: 1932-09-28 Today's Date: 11/27/2021   History of Present Illness Jacqueline [name] is a 88 y.o. female with medical history significant for chronic systolic CHF with last known LVEF of 20 to 25%, COPD with chronic respiratory failure on 4 L of oxygen continuous, diabetes mellitus with complications of stage III chronic kidney disease, GERD, hypothyroidism who was discharged from the hospital on 11/25/21 and returned to the ER within 24 hours for evaluation of multiple symptoms. Pt with new acute infarct R cerebellum.   Clinical Impression   Patient presenting with decreased Ind in self care, balance, functional mobility/transfers, endurance, and safety awareness. Patient's daughter present to confirm baseline. Pt lives at home alone and has a PCA and daughter assists with self care and IADLs. Patient currently functioning at min - mod A and fatigues quickly during session. Pt able to take several side steps with RW and transfers to [facility] secondary to pt's linens being soaked. Pt remains on [facility] with daughter in the room and NT notified.  Patient will benefit from acute OT to increase overall independence in the areas of ADLs, functional mobility, and safety awareness in order to safely discharge to next venue of care.      Recommendations for follow up therapy are one component of a multi-disciplinary discharge planning process, led by the attending physician.  Recommendations may be updated based on patient status, additional functional criteria and insurance authorization.   Follow Up Recommendations  Skilled nursing-short term rehab (<3 hours/day)    Assistance Recommended at Discharge Frequent or constant Supervision/Assistance  Patient can return home with the following Assistance with cooking/housework;A lot of help with bathing/dressing/bathroom;A lot of help with walking and/or transfers;Help with stairs or  ramp for entrance;Assist for transportation;Direct supervision/assist for financial management;Direct supervision/assist for medications management    Functional Status Assessment  Patient has had a recent decline in their functional status and demonstrates the ability to make significant improvements in function in a reasonable and predictable amount of time.  Equipment Recommendations  None recommended by OT (defer to next venue of care)       Precautions / Restrictions Precautions Precautions: Fall Restrictions Weight Bearing Restrictions: No      Mobility Bed Mobility Overal bed mobility: Needs Assistance Bed Mobility: Supine to Sit     Supine to sit: Min assist          Transfers Overall transfer level: Needs assistance Equipment used: Rolling walker (2 wheels) Transfers: Sit to/from Stand Sit to Stand: Min assist                  Balance Overall balance assessment: Needs assistance Sitting-balance support: Bilateral upper extremity supported, Feet unsupported Sitting balance-Leahy Scale: Good     Standing balance support: Reliant on assistive device for balance, During functional activity Standing balance-Leahy Scale: Poor                             ADL either performed or assessed with clinical judgement   ADL Overall ADL's : Needs assistance/impaired     Grooming: Wash/dry hands;Wash/dry face;Sitting;Supervision/safety;Set up           Upper Body Dressing : Minimal assistance;Sitting Upper Body Dressing Details (indicate cue type and reason): to change gown     Toilet Transfer: Minimal assistance;BSC/3in1;Rolling walker (2 wheels)           Functional mobility  during ADLs: Minimal assistance;Rolling walker (2 wheels)       Vision Patient Visual Report: No change from baseline              Pertinent Vitals/Pain Pain Assessment Pain Assessment: Faces Faces Pain Scale: No hurt     Hand Dominance Right    Extremity/Trunk Assessment Upper Extremity Assessment Upper Extremity Assessment: Generalized weakness   Lower Extremity Assessment Lower Extremity Assessment: Generalized weakness       Communication Communication Communication: HOH   Cognition Arousal/Alertness: Awake/alert Behavior During Therapy: Flat affect Overall Cognitive Status: Within Functional Limits for tasks assessed                                 General Comments: Pt is pleasant and cooperative and follows 1 step commands with increased time to process.                Home Living Family/patient expects to be discharged to:: Skilled nursing facility Living Arrangements: Alone Available Help at Discharge: Family;Personal care attendant;Available PRN/intermittently Type of Home: House Home Access: Ramped entrance;Stairs to enter Entrance Stairs-Number of Steps: 1   Home Layout: One level     Bathroom Shower/Tub: Occupational psychologist: Standard Bathroom Accessibility: Yes   Home Equipment: Rollator (4 wheels);Grab bars - tub/shower;Shower seat          Prior Functioning/Environment Prior Level of Function : Independent/Modified Independent             Mobility Comments: Independent household ambulator using rollator ADLs Comments: Pt.'s Daugher and aide assist with morning ADLs, shower transfers, meal preparation,home management, transportation. Pt. is responsible for taking her medications which are in bubble packs with times specified on the packs.        OT Problem List: Decreased strength;Decreased activity tolerance;Pain;Decreased safety awareness;Decreased knowledge of use of DME or AE;Impaired balance (sitting and/or standing)      OT Treatment/Interventions: Self-care/ADL training;DME and/or AE instruction;Therapeutic exercise;Patient/family education;Energy conservation;Therapeutic activities;Neuromuscular education;Balance training    OT Goals(Current  goals can be found in the care plan section) Acute Rehab OT Goals Patient Stated Goal: to get better and stronger OT Goal Formulation: With patient Time For Goal Achievement: 12/11/21 Potential to Achieve Goals: Good  OT Frequency: Min 2X/week       AM-PAC OT "6 Clicks" Daily Activity     Outcome Measure Help from another person eating meals?: A Little Help from another person taking care of personal grooming?: A Little Help from another person toileting, which includes using toliet, bedpan, or urinal?: A Lot Help from another person bathing (including washing, rinsing, drying)?: A Lot Help from another person to put on and taking off regular upper body clothing?: A Little Help from another person to put on and taking off regular lower body clothing?: A Lot 6 Click Score: 15   End of Session Equipment Utilized During Treatment: Rolling walker (2 wheels) Nurse Communication: Mobility status;Other (comment) (OT notified NT of pt's linens being wet and pt on [facility] with family in the room)  Activity Tolerance: Patient limited by fatigue Patient left: with call bell/phone within reach;with family/visitor present;Other (comment) (Pt on BSC with daughter)  OT Visit Diagnosis: Muscle weakness (generalized) (M62.81)                Time: 3875-6433 OT Time Calculation (min): 29 min Charges:  OT General Charges $OT Visit: 1 Visit OT Evaluation $OT  Eval Moderate Complexity: 1 Mod OT Treatments $Self Care/Home Management : 8-22 mins  Darleen Crocker, MS, OTR/L , CBIS ascom [phone]  11/27/21, 12:55 PM

## 2021-11-27 NOTE — Hospital Course (Signed)
88 year old female with past medical history of chronic systolic CHF with last EF 20 to 25%, chronic respiratory failure with hypoxia due to COPD on 4 L/min continuous O2 at baseline, type II diabetes with renal complications CKD stage IIIa, GERD, hypothyroidism who was discharged from the hospital on 6/12 to rehab but returned to the ER less than 24 hours later for evaluation of multiple complaints including lethargy, slurred speech diaphoresis with left shoulder pain radiating to the arm and back pain, no specific complaint of chest pain however.  ED evaluation revealed elevated troponin up to 850, potassium 6.1, creatinine 1.83 up from baseline 1.0.  Noncontrast head CT was unremarkable.  EKG was nonacute, showed prolonged PR interval.  MRI brain was obtained subsequently and showed acute right cerebellar infarct.  Admitted to the hospital with consults for cardiology and neurology.

## 2021-11-27 NOTE — Plan of Care (Signed)

## 2021-11-27 NOTE — Progress Notes (Signed)
       CROSS COVER NOTE  NAME: Jacqueline [name] MRN: [id number] DOB : Jan 09, 1933    Date of Service   11/27/2021  HPI/Events of Note   Secure chat received from nursing "Noted conversion to afib rhythm at 0422H. Controlled rate, latest HR: 66bpm. Pt was asleep when it happened. Upon assessment, pt denies palpitations, chest pain or headache."  BP 99/47 MAP 64  Jacqueline [name] is a 88 year old female with medical history significant for chronic systolic CHF with last known EF of 20 to 25%, COPD with chronic respiratory failure on 4 L of oxygen at home, DM type II, stage III kidney disease, GERD, and hypothyroidism who presents to [facility] ED with reports of weakness,, left arm pain and clamminess.  MRI yesterday showed acute infarct in the right cerebellum with mild associated edema without mass effect.  On review of chart this appears to be new onset AFIB.  11/18/21 TSH 2.925 11/27/21 CXR showed "no active disease"   Interventions   Plan: Check electrolytes with AM labs Defer anticoagulation and further management to day team in setting of stroke.       Jacqueline Glass DNP, MHA, FNP-BC Nurse Practitioner [facility] Pager [phone]

## 2021-11-27 NOTE — Evaluation (Signed)
Physical Therapy Evaluation Patient Details Name: Jacqueline [name] MRN: [id number] DOB: 04-05-1933 Today's Date: 11/27/2021  History of Present Illness  Jacqueline [name] is a 88 y.o. female with medical history significant for chronic systolic CHF with last known LVEF of 20 to 25%, COPD with chronic respiratory failure on 4 L of oxygen continuous, diabetes mellitus with complications of stage III chronic kidney disease, GERD, hypothyroidism who was discharged from the hospital on 11/25/21 and returned to the ER within 24 hours for evaluation of multiple symptoms. Pt with new acute infarct R cerebellum.   Clinical Impression  Pt received in Semi-Fowler's position and agreeable to therapy.  Pt's daughter in room and assisted with prior level of function and history.  Pt is HOH at times and daughter helps with questions needed.  Pt notes to be very fatigued and has decreased functional mobility during session today.  Pt requires assistance with most mobility and when in standing has forward flexed trunk and is able to correct with constant verbal cuing, but does not maintain posture when not verbally cued.  Pt demonstrates increased increased weakness in both UE's and LE's.  Pt able to ambulate to the door of the room and then to the [facility] where she noted she needed to have a BM.  Pt assisted with peri-care, but unable to have a BM.  Pt then transferred back to bed with all needs met and call bell within reach.  Nursing staff in room with pt for vitals.  Current discharge plans to SNF are appropriate at this time, if daughter is not able to establish 24/7 care.  Pt will continue to benefit from skilled therapy in order to address deficits listed below.       Recommendations for follow up therapy are one component of a multi-disciplinary discharge planning process, led by the attending physician.  Recommendations may be updated based on patient status, additional functional criteria and insurance  authorization.  Follow Up Recommendations Skilled nursing-short term rehab (<3 hours/day)    Assistance Recommended at Discharge Intermittent Supervision/Assistance  Patient can return home with the following  A little help with walking and/or transfers;A little help with bathing/dressing/bathroom;Assistance with cooking/housework;Help with stairs or ramp for entrance    Equipment Recommendations Rolling walker (2 wheels);BSC/3in1  Recommendations for Other Services       Functional Status Assessment Patient has had a recent decline in their functional status and demonstrates the ability to make significant improvements in function in a reasonable and predictable amount of time.     Precautions / Restrictions Precautions Precautions: Fall Restrictions Weight Bearing Restrictions: No      Mobility  Bed Mobility Overal bed mobility: Needs Assistance Bed Mobility: Supine to Sit     Supine to sit: Min assist     General bed mobility comments: verbal cuing for hand placement and minA to scoot EOB    Transfers Overall transfer level: Needs assistance Equipment used: Rolling walker (2 wheels) Transfers: Sit to/from Stand Sit to Stand: Min assist           General transfer comment: minA to initiate stand due to not being able to reach the floor from seated position.    Ambulation/Gait Ambulation/Gait assistance: Min guard Gait Distance (Feet): 15 Feet Assistive device: Rolling walker (2 wheels) Gait Pattern/deviations: Step-to pattern, Decreased step length - right, Decreased step length - left, Trunk flexed Gait velocity: decreased     General Gait Details: Pt ambulated to the door and back to the  BSC reporting that she needed to have a BMPt keeps RW close by during gait, but has significant forward trunk flexion that she is unable to maintain once corrected.  Stairs            Wheelchair Mobility    Modified Rankin (Stroke Patients Only)        Balance Overall balance assessment: Needs assistance Sitting-balance support: Bilateral upper extremity supported, Feet unsupported Sitting balance-Leahy Scale: Good     Standing balance support: Reliant on assistive device for balance, During functional activity Standing balance-Leahy Scale: Poor                               Pertinent Vitals/Pain Pain Assessment Pain Assessment: No/denies pain    Home Living Family/patient expects to be discharged to:: Skilled nursing facility Living Arrangements: Alone Available Help at Discharge: Family;Personal care attendant;Available PRN/intermittently Type of Home: House Home Access: Ramped entrance;Stairs to enter   Entrance Stairs-Number of Steps: 1   Home Layout: One level Home Equipment: Rollator (4 wheels);Grab bars - tub/shower;Shower seat      Prior Function Prior Level of Function : Independent/Modified Independent             Mobility Comments: Independent household ambulator using rollator ADLs Comments: Pt.'s Daugher and aide assist with morning ADLs, shower transfers, meal preparation,home management, transportation. Pt. is responsible for taking her medications which are in bubble packs with times specified on the packs.     Hand Dominance   Dominant Hand: Right    Extremity/Trunk Assessment   Upper Extremity Assessment Upper Extremity Assessment: Generalized weakness    Lower Extremity Assessment Lower Extremity Assessment: Generalized weakness    Cervical / Trunk Assessment Cervical / Trunk Assessment: Kyphotic  Communication   Communication: HOH  Cognition Arousal/Alertness: Awake/alert Behavior During Therapy: Flat affect Overall Cognitive Status: Within Functional Limits for tasks assessed                                 General Comments: Pt is pleasant and cooperative and follows 1 step commands with increased time to process.        General Comments       Exercises General Exercises - Lower Extremity Ankle Circles/Pumps: Strengthening, Both, 10 reps Quad Sets: Strengthening, Both, 10 reps Long Arc Quad: Strengthening, Both, 10 reps Hip Flexion/Marching: Seated, Strengthening, Both, 10 reps   Assessment/Plan    PT Assessment Patient needs continued PT services  PT Problem List Decreased strength;Decreased mobility;Decreased activity tolerance;Decreased balance       PT Treatment Interventions DME instruction;Therapeutic exercise;Balance training;Gait training;Stair training;Functional mobility training;Therapeutic activities;Patient/family education    PT Goals (Current goals can be found in the Care Plan section)  Acute Rehab PT Goals Patient Stated Goal: none stated PT Goal Formulation: With patient Time For Goal Achievement: 12/11/21 Potential to Achieve Goals: Fair    Frequency Min 2X/week     Co-evaluation               AM-PAC PT "6 Clicks" Mobility  Outcome Measure Help needed turning from your back to your side while in a flat bed without using bedrails?: A Little Help needed moving from lying on your back to sitting on the side of a flat bed without using bedrails?: A Little Help needed moving to and from a bed to a chair (including a wheelchair)?: A Little  Help needed standing up from a chair using your arms (e.g., wheelchair or bedside chair)?: A Little Help needed to walk in hospital room?: A Lot Help needed climbing 3-5 steps with a railing? : A Lot 6 Click Score: 16    End of Session Equipment Utilized During Treatment: Gait belt Activity Tolerance: Patient tolerated treatment well;Patient limited by fatigue Patient left: with call bell/phone within reach;with chair alarm set;with family/visitor present;in bed Nurse Communication: Mobility status PT Visit Diagnosis: Difficulty in walking, not elsewhere classified (R26.2);Muscle weakness (generalized) (M62.81);Unsteadiness on feet (R26.81)    Time:  0141-0301 PT Time Calculation (min) (ACUTE ONLY): 44 min   Charges:   PT Evaluation $PT Eval Low Complexity: 1 Low PT Treatments $Gait Training: 8-22 mins $Therapeutic Activity: 8-22 mins        Gwenlyn Saran, PT, DPT 11/27/21, 1:24 PM   Christie Nottingham 11/27/2021, 1:16 PM

## 2021-11-27 NOTE — Progress Notes (Signed)
Progress Note   [name] ELF:[id number] DOB: 09-01-1932 DOA: 11/26/2021     1 DOS: the patient was seen and examined on 11/27/2021   Brief hospital course: 88 year old female with past medical history of chronic systolic CHF with last EF 20 to 25%, chronic respiratory failure with hypoxia due to COPD on 4 L/min continuous O2 at baseline, type II diabetes with renal complications CKD stage IIIa, GERD, hypothyroidism who was discharged from the hospital on 6/12 to rehab but returned to the ER less than 24 hours later for evaluation of multiple complaints including lethargy, slurred speech diaphoresis with left shoulder pain radiating to the arm and back pain, no specific complaint of chest pain however.  ED evaluation revealed elevated troponin up to 850, potassium 6.1, creatinine 1.83 up from baseline 1.0.  Noncontrast head CT was unremarkable.  EKG was nonacute, showed prolonged PR interval.  MRI brain was obtained subsequently and showed acute right cerebellar infarct.  Admitted to the hospital with consults for cardiology and neurology.  Assessment and Plan: * NSTEMI (non-ST elevated myocardial infarction) ([place]) Presented for evaluation of diaphoresis, left shoulder/upper back pain and chest pain. Initial troponin is elevated at 850. --Cardiology is consulted, follow-up recommendations --Hold off on heparin given acute stroke --Continue aspirin and statin --Hold carvedilol for now due to relative hypotension  Chronic respiratory failure (HCC) Stable and at baseline. Continue oxygen supplementation at 4 L to maintain pulse oximetry greater than 92%  AKI (acute kidney injury) ([place]) Most likely secondary to over diuresis, rule out cardiorenal syndrome Baseline serum creatinine of 1.0. Creatinine on admission 1.83 with associated uremia. Noted to have relative hypotension with SBP of 90.  Given1 L of IV fluids in the ER. --Hold off further IV fluids given low EF --Hold  torsemide and Entresto --Monitor BMP daily -- Avoid nephrotoxins and hypotension Renally dose meds as indicated  Slurred speech Daughter reported slurred speech following episode of unresponsiveness and significant lethargy on morning of 6/13.  This prompted EMS call to bring patient in for evaluation. Unknown last known well time Noncontrast CT head negative for bleed. MRI brain did not show acute infarct in the right cerebellum -- Neurology consulted -- PT/OT/SLP consults -- Continue ASA, statin -- Monitor on telemetry   Hyperkalemia Noted to have hyperkalemia (K6.1) without any EKG changes.  Treated in the ED with dextrose, insulin and a dose of Lokelma. --Hold oral potassium supplements as well as Entresto --Monitor BMP  Hypothyroidism Stable.  Continue Synthroid.  Chronic systolic CHF (congestive heart failure) (HCC) Patient has a history of chronic systolic heart failure with LVEF of 20 to 25% on most recent echo. Patient had just been discharged from the hospital on 06/12, after diuresis for acute decompensation.   Given IV fluids in the ER due to hypotension and appearing hypovolemic. --Hold torsemide, Entresto and carvedilol --Monitor volume status closely -- Daily weights -- Follow cardiology's recommendations  CKD stage 3 due to type 2 diabetes mellitus ([place]) With AKI on admission, likely secondary to hypotension, possibly overdiuresis during recent admission.   -- BMP  Depression Stable. Continue sertraline.  UTI (urinary tract infection) UA on admission with pyuria but patient denied having any urinary symptoms. Given Rocephin 1 g IV in the ER. --treat with 2 more doses of IV Rocephin --Follow-up results of urine culture   New onset a-fib [facility]) Patient was noted in A-fib around 4:20 AM this morning.  No known prior history per chart review.  This could be  etiology of her stroke. -- Monitor on telemetry -- Cardiology is consulted -- Holding off  anticoagulation given acute stroke        Subjective: Patient was working with PT and using bedside commode.  Seen with daughter at bedside on rounds.  Overall patient seems improved since admission without any more episodes of lethargy or unresponsiveness.  Daughter reports no known history of A-fib.  No other acute complaints or acute events reported.  Physical Exam: Vitals:   11/27/21 0426 11/27/21 0742 11/27/21 1219 11/27/21 1434  BP: (!) 99/47 101/67 (!) 112/52 (!) 99/57  Pulse: 68 72 79 77  Resp: '20 18 20 17  '$ Temp: 97.9 F (36.6 C) 98.8 F (37.1 C) 98.2 F (36.8 C) 98.3 F (36.8 C)  TempSrc:   Oral Oral  SpO2: 98% 99% 95% 98%  Weight:      Height:       General exam: awake, alert, no acute distress HEENT: atraumatic, clear conjunctiva, anicteric sclera, moist mucus membranes Respiratory system: On room air, normal respiratory effort. Cardiovascular system: RRR, no peripheral edema.   Central nervous system: Grossly nonfocal exam, alert and awake Extremities: moves all, no edema, normal tone Skin: dry, intact, no rashes seen on visualized skin Psychiatry: normal mood, congruent affect   Data Reviewed:  Notable labs: Glucose 117, BUN 66, creatinine 1.34, calcium 8.8, magnesium 2.6, GFR 38, cholesterol 262, HDL 34, LDL 165, triglycerides 315, VLDL 63, hemoglobin 15.8, platelets 119  Family Communication: At bedside on rounds  Disposition: Status is: Inpatient Remains inpatient appropriate because: Ongoing evaluation not appropriate for the outpatient setting as outlined above.  Discharge pending clearance by cardiology and neurology.   Planned Discharge Destination: Skilled nursing facility    Time spent: 40 minutes  Author: Ezekiel Slocumb, DO 11/27/2021 4:22 PM  For on call review [email or web address].

## 2021-11-27 NOTE — Consult Note (Signed)
CARDIOLOGY CONSULT NOTE               Patient ID: Jacqueline [name] MRN: [id number] DOB/AGE: 1932-08-08 88 y.o.  Admit date: 11/26/2021 Referring Physician Dr. Francine Graven hospitalist Primary Physician Dr. Ginette Pitman primary Primary Cardiologist Dr. Nehemiah Massed Reason for Consultation acute stroke new onset atrial fibrillation cardiomyopathy  HPI: Patient 88 year old female recently discharged back to skilled nursing facility patient reportedly was not on oxygen for an extended period of time and became severely hypoxic she has significant hypoxemia chronically with known lung disease congestive heart failure normally on 4 L cardiomyopathy EF of 20 to 25% chronic renal insufficiency GERD and again had altered mental status changes and subsequently follow-up imaging suggested new infarct patient had significant electrolyte abnormalities with elevated potassium and elevated BUN and creatinine.  Patient states the pain was 5 out of 10 Episode of mild chest pain while in the emergency room.  EKG initially showed sinus rhythm with prolonged PR. /Seconds follow-up EKGs persisted atrial fibrillation.  Patient was found to have troponins over 800 patient was placed on aspirin given 1 L bolus for hypotension and now presented for further evaluation.  Patient pain-free currently.  Review of systems complete and found to be negative unless listed above     Past Medical History:  Diagnosis Date   Anemia    CHF (congestive heart failure) ([place])    Diabetes mellitus without complication ([place]) 64/33/2951   Currently no high BS.  Has come off meds.  But having episodes of low BS now.   Dilated idiopathic cardiomyopathy ([facility])    GERD (gastroesophageal reflux disease)    Hyperlipidemia    Hypertension    Hypothyroidism    Osteoporosis    Renal disorder    stage 3    Past Surgical History:  Procedure Laterality Date   BUNIONECTOMY     COLONOSCOPY     ESOPHAGOGASTRODUODENOSCOPY (EGD) WITH PROPOFOL N/A  10/15/2017   Procedure: ESOPHAGOGASTRODUODENOSCOPY (EGD) WITH PROPOFOL;  Surgeon: Lollie Sails, MD;  Location: [facility] ENDOSCOPY;  Service: Endoscopy;  Laterality: N/A;   KYPHOPLASTY N/A 06/12/2017   Procedure: [id number];  Surgeon: Hessie Knows, MD;  Location: [facility] ORS;  Service: Orthopedics;  Laterality: N/A;   TONSILLECTOMY      Medications Prior to Admission  Medication Sig Dispense Refill Last Dose   alendronate (FOSAMAX) 70 MG tablet Take 70 mg by mouth once a week. Take with a full glass of water on an empty stomach.   11/25/2021 at 0900   carvedilol (COREG) 12.5 MG tablet Take by mouth 2 (two) times daily with a meal.    11/26/2021 at 0800   ergocalciferol (VITAMIN D2) 50000 units capsule Take 50,000 Units by mouth once a week.   11/25/2021 at 0900   isosorbide mononitrate (IMDUR) 60 MG 24 hr tablet Take 0.5 tablets (30 mg total) by mouth daily. 30 tablet 1 11/26/2021 at 0900   JARDIANCE 10 MG TABS tablet Take 10 mg by mouth daily.   11/25/2021 at 2000   KLOR-CON M10 10 MEQ tablet Take 10 mEq by mouth daily.   11/26/2021 at 0900   levothyroxine (SYNTHROID, LEVOTHROID) 25 MCG tablet Take 25 mcg by mouth daily before breakfast.   11/26/2021 at 0600   pantoprazole (PROTONIX) 40 MG tablet Take 40 mg by mouth daily.   11/26/2021 at 0900   sacubitril-valsartan (ENTRESTO) 24-26 MG Take 1 tablet by mouth 2 (two) times daily. 60 tablet 0 11/26/2021 at 0900   sertraline (ZOLOFT) 50 MG  tablet Take 50 mg by mouth daily.   11/26/2021 at 0900   torsemide (DEMADEX) 20 MG tablet Take 1 tablet (20 mg total) by mouth daily. 30 tablet 0 11/26/2021 at 0900   glucose blood test strip USE TO CHECK BLOOD SUGARS TWICE A DAY      iron polysaccharides (NIFEREX) 150 MG capsule Take 150 mg by mouth daily. (Patient not taking: Reported on 11/26/2021)   Not Taking   Social History   Socioeconomic History   Marital status: Widowed    Spouse name: Not on file   Number of children: Not on file   Years of education: Not  on file   Highest education level: Not on file  Occupational History   Not on file  Tobacco Use   Smoking status: Former    Types: Cigarettes    Quit date: 04/18/1984    Years since quitting: 37.6   Smokeless tobacco: Never  Vaping Use   Vaping Use: Former   Start date: 05/16/1965   Quit date: 05/16/1989  Substance and Sexual Activity   Alcohol use: No   Drug use: No   Sexual activity: Never  Other Topics Concern   Not on file  Social History Narrative   Not on file   Social Determinants of Health   Financial Resource Strain: Not on file  Food Insecurity: Not on file  Transportation Needs: Not on file  Physical Activity: Not on file  Stress: Not on file  Social Connections: Not on file  Intimate Partner Violence: Not on file    Family History  Problem Relation Age of Onset   Premature CHD Brother       Review of systems complete and found to be negative unless listed above      PHYSICAL EXAM  General: Well developed, well nourished, in no acute distress HEENT:  Normocephalic and atramatic Neck:  No JVD.  Lungs: Diffuse rhonchi bilaterally to auscultation and percussion. Heart: HRRR . Normal S1 and S2 without gallops or murmurs.  Abdomen: Bowel sounds are positive, abdomen soft and non-tender  Msk:  Back normal, normal gait. Normal strength and tone for age. Extremities: No clubbing, cyanosis or edema.   Neuro: Alert and oriented X 3. Psych:  Good affect, responds appropriately  Labs:   Lab Results  Component Value Date   WBC 7.2 11/27/2021   HGB 15.8 (H) 11/27/2021   HCT 49.6 (H) 11/27/2021   MCV 92.9 11/27/2021   PLT 119 (L) 11/27/2021    Recent Labs  Lab 11/26/21 1223 11/26/21 1436 11/27/21 0613  NA 134*   < > 137  K 6.1*   < > 4.6  CL 93*   < > 100  CO2 30   < > 25  BUN 71*   < > 66*  CREATININE 1.83*   < > 1.34*  CALCIUM 8.8*   < > 8.8*  PROT 7.0  --   --   BILITOT 1.3*  --   --   ALKPHOS 51  --   --   ALT 14  --   --   AST 34  --    --   GLUCOSE 156*   < > 117*   < > = values in this interval not displayed.   Lab Results  Component Value Date   CKTOTAL 187 02/19/2013   CKMB 1.6 02/19/2013   TROPONINI 0.04 (HH) 10/10/2017    Lab Results  Component Value Date   CHOL 262 (H) 11/27/2021  Lab Results  Component Value Date   HDL 34 (L) 11/27/2021   Lab Results  Component Value Date   LDLCALC 165 (H) 11/27/2021   Lab Results  Component Value Date   TRIG 315 (H) 11/27/2021   Lab Results  Component Value Date   CHOLHDL 7.7 11/27/2021   No results found for: "LDLDIRECT"    Radiology: MR BRAIN WO CONTRAST  Result Date: 11/26/2021 CLINICAL DATA:  Mental status change, unknown cause EXAM: MRI HEAD WITHOUT CONTRAST TECHNIQUE: Multiplanar, multiecho pulse sequences of the brain and surrounding structures were obtained without intravenous contrast. COMPARISON:  Same day CT head. FINDINGS: Brain: Acute infarct in the right cerebellum. Mild associated edema without mass effect. No evidence of acute hemorrhage, mass lesion, midline shift, or extra-axial fluid collection. Cerebral atrophy. Mild to moderate scattered T2/FLAIR hyperintensities in the white matter, nonspecific but compatible with chronic microvascular disease. Vascular: Major arterial flow voids are maintained at the skull base. Skull and upper cervical spine: Normal marrow signal. Sinuses/Orbits: Clear sinuses.  No acute orbital findings. Other: No sizable mastoid effusions IMPRESSION: Acute infarct in the right cerebellum. Mild associated edema without mass effect. Electronically Signed   By: Margaretha Sheffield M.D.   On: 11/26/2021 15:37   CT HEAD WO CONTRAST (5MM)  Result Date: 11/26/2021 CLINICAL DATA:  Altered mental status. EXAM: CT HEAD WITHOUT CONTRAST TECHNIQUE: Contiguous axial images were obtained from the base of the skull through the vertex without intravenous contrast. RADIATION DOSE REDUCTION: This exam was performed according to the departmental  dose-optimization program which includes automated exposure control, adjustment of the mA and/or kV according to patient size and/or use of iterative reconstruction technique. COMPARISON:  January 05, 2019. FINDINGS: Brain: Mild diffuse cortical atrophy is noted. Mild chronic ischemic white matter disease is noted. No mass effect or midline shift is noted. Ventricular size is within normal limits. There is no evidence of mass lesion, hemorrhage or acute infarction. Vascular: No hyperdense vessel or unexpected calcification. Skull: Normal. Negative for fracture or focal lesion. Sinuses/Orbits: No acute finding. Other: None. IMPRESSION: No acute intracranial abnormality seen. Electronically Signed   By: Marijo Conception M.D.   On: 11/26/2021 13:49   DG Chest Portable 1 View  Result Date: 11/26/2021 CLINICAL DATA:  Chest pain.  Weakness. EXAM: PORTABLE CHEST 1 VIEW COMPARISON:  11/24/2021 FINDINGS: The heart size and mediastinal contours are within normal limits. Aortic atherosclerotic calcification incidentally noted. Both lungs are clear. Chrondrous bone lesion again seen in the proximal left humeral metaphysis. IMPRESSION: No active disease. Electronically Signed   By: Marlaine Hind M.D.   On: 11/26/2021 12:36   CT Angio Chest Pulmonary Embolism (PE) W or WO Contrast  Result Date: 11/25/2021 CLINICAL DATA:  hemoptysis EXAM: CT ANGIOGRAPHY CHEST WITH CONTRAST TECHNIQUE: Multidetector CT imaging of the chest was performed using the standard protocol during bolus administration of intravenous contrast. Multiplanar CT image reconstructions and MIPs were obtained to evaluate the vascular anatomy. RADIATION DOSE REDUCTION: This exam was performed according to the departmental dose-optimization program which includes automated exposure control, adjustment of the mA and/or kV according to patient size and/or use of iterative reconstruction technique. CONTRAST:  56m OMNIPAQUE IOHEXOL 350 MG/ML SOLN COMPARISON:  CT  08/19/2018 FINDINGS: Cardiovascular: There is contrast mixing in the right and left branch pulmonary arteries and right lower lobe arteries somewhat limiting evaluation. Within this limitation there is no evidence of pulmonary embolism. Mild cardiomegaly. Enlarged main pulmonary artery and mildly dilated right ventricle suggesting pulmonary  hypertension. No pericardial disease.Coronary artery atherosclerosis. Mediastinum/Nodes: No lymphadenopathy. The thyroid is unremarkable. There is either nodular wall thickening or ingested contents within the upper esophagus (series 5, images 60-73) Lungs/Pleura: Chronic complete right upper lobe atelectasis/scarring and mild bronchiectasis. Medial basilar right lower lobe nodule measures 6 mm, previously 6 mm, stable since 2018 and consider benign (series 6, image 69). There are multiple stable calcified granulomas. There is a 5 mm left upper lobe pulmonary nodule which is stable since March 2020 and also likely benign (series 6, image 26). There is a new ground-glass opacity or nodule which measures 9 mm in the right middle lobe (series 6, image 32). No pleural effusion. No pneumothorax. Upper Abdomen: Hepatic cirrhosis. Subcentimeter hypodense segment 7 lesion is unchanged. Musculoskeletal: Unchanged left proximal humerus probable enchondroma versus bone infarct. No acute osseous abnormality. Multilevel degenerative changes of the spine. There is a severe T6 compression deformity with prior vertebroplasty which is unchanged from prior exam. No new compression deformity. Review of the MIP images confirms the above findings. IMPRESSION: No evidence of pulmonary embolism. Note there is a degree of contrast mixing in the right and left branch pulmonary arteries and right lower lobe arteries which somewhat limits evaluation. Chronic complete right upper lobe atelectasis with associated bronchiectasis. Findings suggestive of pulmonary arterial hypertension. New ground-glass  opacity/ground-glass nodule measuring 9 mm in the right middle lobe, which could be infectious/inflammatory. Stable other pulmonary nodules. Initial follow-up with CT at 6 months is recommended to confirm persistence. If persistent, repeat CT is recommended every 2 years until 5 years of stability has been established. This recommendation follows the consensus statement: Guidelines for Management of Incidental Pulmonary Nodules Detected on CT Images: From the Fleischner Society 2017; Radiology 2017; 284:228-243. Coronary artery atherosclerosis. Hepatic cirrhosis. Electronically Signed   By: Maurine Simmering M.D.   On: 11/25/2021 12:52   DG Chest Port 1 View  Result Date: 11/24/2021 CLINICAL DATA:  Respiratory distress and hemoptysis. EXAM: PORTABLE CHEST 1 VIEW COMPARISON:  November 18, 2021 FINDINGS: The heart size and mediastinal contours are within normal limits. There is marked severity calcification of the thoracic aorta. Mild, diffuse, chronic appearing increased interstitial lung markings are seen. There is no evidence of acute infiltrate, pleural effusion or pneumothorax. A stable enchondroma is suspected within the proximal left humerus. IMPRESSION: Chronic appearing increased interstitial lung markings without evidence of acute or active cardiopulmonary disease. Electronically Signed   By: Virgina Norfolk M.D.   On: 11/24/2021 16:34   ECHOCARDIOGRAM COMPLETE  Result Date: 11/19/2021    ECHOCARDIOGRAM REPORT   Patient Name:   Jacqueline [name] Date of Exam: 11/18/2021 Medical Rec #:  [id number]     Height:       57.0 in Accession #:    [id number]    Weight:       134.0 lb Date of Birth:  July 08, 1932     BSA:          1.517 m Patient Age:    82 years      BP:           183/100 mmHg Patient Gender: F             HR:           78 bpm. Exam Location:  [facility] Procedure: 2D Echo, Color Doppler and Cardiac Doppler Indications:     I50.9 Congestive heart failure  History:         Patient has prior history of Echocardiogram  examinations. CHF                  and Cardiomyopathy; Risk Factors:Former Smoker, Diabetes,                  Hypertension and Dyslipidemia.  Sonographer:     Rosalia Hammers Referring Phys:  [id number] ASIA B [place] Diagnosing Phys: Serafina Royals MD  Sonographer Comments: Technically difficult study due to poor echo windows. Image acquisition challenging due to respiratory motion. IMPRESSIONS  1. Left ventricular ejection fraction, by estimation, is 20 to 25%. The left ventricle has severely decreased function. The left ventricle demonstrates global hypokinesis. The left ventricular internal cavity size was mildly dilated. There is severe left ventricular hypertrophy. Left ventricular diastolic parameters are consistent with Grade II diastolic dysfunction (pseudonormalization).  2. Right ventricular systolic function is normal. The right ventricular size is normal.  3. Left atrial size was mildly dilated.  4. The mitral valve is normal in structure. Mild mitral valve regurgitation.  5. The aortic valve is normal in structure. Aortic valve regurgitation is trivial. FINDINGS  Left Ventricle: Left ventricular ejection fraction, by estimation, is 20 to 25%. The left ventricle has severely decreased function. The left ventricle demonstrates global hypokinesis. The left ventricular internal cavity size was mildly dilated. There is severe left ventricular hypertrophy. Left ventricular diastolic parameters are consistent with Grade II diastolic dysfunction (pseudonormalization). Right Ventricle: The right ventricular size is normal. No increase in right ventricular wall thickness. Right ventricular systolic function is normal. Left Atrium: Left atrial size was mildly dilated. Right Atrium: Right atrial size was normal in size. Pericardium: There is no evidence of pericardial effusion. Mitral Valve: The mitral valve is normal in structure. Mild mitral valve regurgitation. Tricuspid Valve: The tricuspid valve is normal in  structure. Tricuspid valve regurgitation is mild. Aortic Valve: The aortic valve is normal in structure. Aortic valve regurgitation is trivial. Aortic valve mean gradient measures 3.0 mmHg. Aortic valve peak gradient measures 5.7 mmHg. Aortic valve area, by VTI measures 1.61 cm. Pulmonic Valve: The pulmonic valve was normal in structure. Pulmonic valve regurgitation is mild. Aorta: The aortic root and ascending aorta are structurally normal, with no evidence of dilitation. IAS/Shunts: No atrial level shunt detected by color flow Doppler.  LEFT VENTRICLE PLAX 2D LVIDd:         4.54 cm   Diastology LVIDs:         3.25 cm   LV e' medial:  4.03 cm/s LV PW:         1.42 cm   LV e' lateral: 6.74 cm/s LV IVS:        1.07 cm LVOT diam:     1.70 cm LV SV:         32 LV SV Index:   21 LVOT Area:     2.27 cm  RIGHT VENTRICLE RV Basal diam:  3.77 cm RV S prime:     7.80 cm/s LEFT ATRIUM             Index        RIGHT ATRIUM           Index LA diam:        3.50 cm 2.31 cm/m   RA Area:     14.40 cm LA Vol (A2C):   46.8 ml 30.85 ml/m  RA Volume:   36.50 ml  24.06 ml/m LA Vol (A4C):   40.0 ml 26.37 ml/m LA Biplane Vol: 44.2 ml 29.13 ml/m  AORTIC VALVE                    PULMONIC VALVE AV Area (Vmax):    1.23 cm     PV Vmax:          0.75 m/s AV Area (Vmean):   1.24 cm     PV Vmean:         58.700 cm/s AV Area (VTI):     1.61 cm     PV VTI:           0.154 m AV Vmax:           119.00 cm/s  PV Peak grad:     2.2 mmHg AV Vmean:          77.000 cm/s  PV Mean grad:     1.0 mmHg AV VTI:            0.197 m      PR End Diast Vel: 12.53 msec AV Peak Grad:      5.7 mmHg AV Mean Grad:      3.0 mmHg LVOT Vmax:         64.30 cm/s LVOT Vmean:        42.200 cm/s LVOT VTI:          0.140 m LVOT/AV VTI ratio: 0.71  AORTA Ao Root diam: 3.30 cm  SHUNTS Systemic VTI:  0.14 m Systemic Diam: 1.70 cm Serafina Royals MD Electronically signed by Serafina Royals MD Signature Date/Time: 11/19/2021/1:08:01 PM    Final    DG Chest Portable 1  View  Result Date: 11/18/2021 CLINICAL DATA:  Shortness of breath and respiratory distress. EXAM: PORTABLE CHEST 1 VIEW COMPARISON:  None Available. FINDINGS: The heart size and mediastinal contours are within normal limits. There is marked severity calcification and tortuosity of the thoracic aorta. Mild, diffuse chronic appearing increased interstitial lung markings are seen. There is no evidence of focal consolidation, pleural effusion or pneumothorax. Prior vertebroplasty is noted within the midthoracic spine. A stable enchondroma is suspected within the proximal left humerus. IMPRESSION: Chronic appearing increased interstitial lung markings without focal consolidation. A mild superimposed component of interstitial edema cannot be excluded. Electronically Signed   By: Virgina Norfolk M.D.   On: 11/18/2021 01:46    EKG: Atrial fibrillation controlled response rate of 73 nonspecific ST-T wave changes Patient has since converted to sinus rhythm  ASSESSMENT AND PLAN:  Acute CVA Hypertension Congestive heart failure Cardiomyopathy Elevated troponin Non-STEMI Chronic renal insufficiency Diabetes Hyperlipidemia GERD Possible acute bronchitis / pneumonia End-stage pulmonary disease continue supplemental oxygen for hypoxemia . Plan Agree with nephrology input Follow-up EKGs and troponins Maintain telemetry patient now in sinus rhythm Would recommend repeat echocardiogram because of elevated troponins Continue diabetes management and control Agree with statin therapy for hyperlipidemia Maintain antibiotic therapy for possible bronchitis Consider long-term anticoagulation with Eliquis 2.5 twice a day prior to discharge Continue supplemental oxygen therapy for chronic hypoxemia  Signed: Yolonda Kida MD 11/27/2021, 1:44 PM

## 2021-11-28 ENCOUNTER — Inpatient Hospital Stay: Payer: Medicare Other

## 2021-11-28 DIAGNOSIS — R531 Weakness: Secondary | ICD-10-CM | POA: Diagnosis not present

## 2021-11-28 DIAGNOSIS — I5022 Chronic systolic (congestive) heart failure: Secondary | ICD-10-CM

## 2021-11-28 DIAGNOSIS — E1122 Type 2 diabetes mellitus with diabetic chronic kidney disease: Secondary | ICD-10-CM

## 2021-11-28 DIAGNOSIS — I639 Cerebral infarction, unspecified: Secondary | ICD-10-CM | POA: Diagnosis present

## 2021-11-28 DIAGNOSIS — N3000 Acute cystitis without hematuria: Secondary | ICD-10-CM

## 2021-11-28 DIAGNOSIS — Z515 Encounter for palliative care: Secondary | ICD-10-CM

## 2021-11-28 DIAGNOSIS — I214 Non-ST elevation (NSTEMI) myocardial infarction: Secondary | ICD-10-CM

## 2021-11-28 DIAGNOSIS — N183 Chronic kidney disease, stage 3 unspecified: Secondary | ICD-10-CM

## 2021-11-28 DIAGNOSIS — Z66 Do not resuscitate: Secondary | ICD-10-CM

## 2021-11-28 DIAGNOSIS — J9610 Chronic respiratory failure, unspecified whether with hypoxia or hypercapnia: Secondary | ICD-10-CM

## 2021-11-28 LAB — CBC
HCT: 47.5 % — ABNORMAL HIGH (ref 36.0–46.0)
Hemoglobin: 15.1 g/dL — ABNORMAL HIGH (ref 12.0–15.0)
MCH: 29.2 pg (ref 26.0–34.0)
MCHC: 31.8 g/dL (ref 30.0–36.0)
MCV: 91.9 fL (ref 80.0–100.0)
Platelets: 167 10*3/uL (ref 150–400)
RBC: 5.17 MIL/uL — ABNORMAL HIGH (ref 3.87–5.11)
RDW: 14.6 % (ref 11.5–15.5)
WBC: 6.1 10*3/uL (ref 4.0–10.5)
nRBC: 0 % (ref 0.0–0.2)

## 2021-11-28 LAB — BASIC METABOLIC PANEL
Anion gap: 8 (ref 5–15)
BUN: 51 mg/dL — ABNORMAL HIGH (ref 8–23)
CO2: 27 mmol/L (ref 22–32)
Calcium: 8.7 mg/dL — ABNORMAL LOW (ref 8.9–10.3)
Chloride: 104 mmol/L (ref 98–111)
Creatinine, Ser: 0.95 mg/dL (ref 0.44–1.00)
GFR, Estimated: 58 mL/min — ABNORMAL LOW (ref >60)
Glucose, Bld: 105 mg/dL — ABNORMAL HIGH (ref 70–99)
Potassium: 3.9 mmol/L (ref 3.5–5.1)
Sodium: 139 mmol/L (ref 135–145)

## 2021-11-28 LAB — ECHOCARDIOGRAM COMPLETE
Height: 57 in
P 1/2 time: 330 msec
S' Lateral: 3.2 cm
Weight: 2148.16 oz

## 2021-11-28 LAB — URINE CULTURE: Culture: >=100000 — AB

## 2021-11-28 LAB — GLUCOSE, CAPILLARY
Glucose-Capillary: 108 mg/dL — ABNORMAL HIGH (ref 70–99)
Glucose-Capillary: 132 mg/dL — ABNORMAL HIGH (ref 70–99)
Glucose-Capillary: 145 mg/dL — ABNORMAL HIGH (ref 70–99)
Glucose-Capillary: 147 mg/dL — ABNORMAL HIGH (ref 70–99)
Glucose-Capillary: 153 mg/dL — ABNORMAL HIGH (ref 70–99)
Glucose-Capillary: 98 mg/dL (ref 70–99)

## 2021-11-28 LAB — LIPOPROTEIN A (LPA): Lipoprotein (a): 66.4 nmol/L — ABNORMAL HIGH (ref <75.0)

## 2021-11-28 MED ORDER — SODIUM CHLORIDE 0.9 % IV BOLUS
250.0000 mL | Freq: Once | INTRAVENOUS | Status: AC
  Administered 2021-11-28: 250 mL via INTRAVENOUS
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
## 2021-11-28 NOTE — Progress Notes (Signed)
SLP Cancellation Note  Patient Details Name: Jacqueline [name] MRN: [id number] DOB: 08-22-1932   Cancelled treatment:       Reason Eval/Treat Not Completed: Fatigue/drowsy limiting ability to participate (chart reviewed; met w/ Dtr/pt in room. Will f/u tomorrow for assessment. Dtr agreed.       Orinda Kenner, MS, CCC-SLP Speech Language Pathologist Rehab Services; [place] [phone] (ascom) Jacqueline [name] 11/28/2021, 7:41 AM

## 2021-11-28 NOTE — TOC Progression Note (Signed)
Transition of Care [facility]) - Progression Note    Patient Details  Name: Jacqueline [name] MRN: [id number] Date of Birth: [birth year]  [facility] of [facility]) CM/SW Contact  Laurena Slimmer, RN Phone Number: 11/28/2021, 3:58 PM  Clinical Narrative:     Message sent to Seth Bake at [facility] regarding return to facility.        Expected Discharge Plan and Services                                                 Social Determinants of Health (SDOH) Interventions    Readmission Risk Interventions     No data to display

## 2021-11-28 NOTE — Evaluation (Signed)
Speech Language Pathology Evaluation Patient Details Name: Jacqueline [name] MRN: [id number] DOB: [birth year] Today's Date: 11/28/2021 Time: 9449-6759 SLP Time Calculation (min) (ACUTE ONLY): 34 min  Problem List:  Patient Active Problem List   Diagnosis Date Noted   New onset a-fib ([place]) 11/27/2021   NSTEMI (non-ST elevated myocardial infarction) ([place]) 11/26/2021   AKI (acute kidney injury) ([place]) 11/26/2021   Hyperkalemia 11/26/2021   Depression 11/26/2021   UTI (urinary tract infection) 11/26/2021   Slurred speech 11/26/2021   Hemoptysis    Weakness 11/22/2021   CKD stage 3 due to type 2 diabetes mellitus ([place]) 11/21/2021   Pain in a tooth or teeth 11/20/2021   Chronic respiratory failure (HCC)    Elevated troponin 11/18/2021   Acute exacerbation of CHF (congestive heart failure) ([place]) 11/18/2021   Thrombocytopenia ([place]) 02/11/2019   Chest pain 01/05/2019   Hyponatremia 06/09/2017   MI, acute, non ST segment elevation ([place]) 06/01/2017   Acute upper back pain 03/05/2017   Carcinoma of unknown primary ([place]) 03/05/2017   Liver lesion 16/38/4665   Chronic systolic CHF (congestive heart failure) ([place]) 02/24/2017   HTN (hypertension) 02/24/2017   GERD (gastroesophageal reflux disease) 02/24/2017   Hypothyroidism 02/24/2017   Hypoglycemia 02/24/2017   Dizziness 02/18/2017   Past Medical History:  Past Medical History:  Diagnosis Date   Anemia    CHF (congestive heart failure) ([place])    Diabetes mellitus without complication ([place]) 99/35/7017   Currently no high BS.  Has come off meds.  But having episodes of low BS now.   Dilated idiopathic cardiomyopathy (HCC)    GERD (gastroesophageal reflux disease)    Hyperlipidemia    Hypertension    Hypothyroidism    Osteoporosis    Renal disorder    stage 3   Past Surgical History:  Past Surgical History:  Procedure Laterality Date   BUNIONECTOMY     COLONOSCOPY     ESOPHAGOGASTRODUODENOSCOPY (EGD) WITH PROPOFOL N/A 10/15/2017    Procedure: ESOPHAGOGASTRODUODENOSCOPY (EGD) WITH PROPOFOL;  Surgeon: Lollie Sails, MD;  Location: [facility] ENDOSCOPY;  Service: Endoscopy;  Laterality: N/A;   KYPHOPLASTY N/A 06/12/2017   Procedure: [id number];  Surgeon: Hessie Knows, MD;  Location: [facility] ORS;  Service: Orthopedics;  Laterality: N/A;   TONSILLECTOMY     HPI:  Per H&P "Jacqueline [name] is a 88 y.o. female with medical history significant for chronic systolic CHF with last known LVEF of 20 to 25%, COPD with chronic respiratory failure on 4 L of oxygen continuous, diabetes mellitus with complications of stage III chronic kidney disease, GERD, hypothyroidism who was discharged from the hospital on 11/25/21 and returned to the ER within 24 hours for evaluation of multiple symptoms.  Patient's daughter notes that she was transported to [facility] on the evening of 06/12 and upon arrival was very lethargic and less responsive than her baseline.  It turns out she was not on oxygen and her pulse oximetry was in the low 80s but this improved once she was placed back on oxygen and she returned back to her baseline mental status.  Daughter states she was called again on the morning of her admission at about 615 am and was told that her mother's pulse oximetry was low again but it looks that it eventually resolved.  She got to the skilled nursing facility at about 7:15 AM and noted that her mother was lethargic and that her speech was slurred.  She also notes that her mother became extremely diaphoretic with complaints  of pain involving her left shoulder and back and later with radiation to the left arm.  She did not have chest pain initially but now in the ER she is complaining of midsternal chest pain.  She rates her pain a 5 x 10 in intensity at its worst and it is nonradiating.  She denies having any nausea, no vomiting, no palpitations.  During my evaluation she is oriented to person and place but her daughter states that her speech is still  slurred.  It is unclear what her last known well time was.  She has a cough which is productive of occasional greY phlegm.  She appears comfortable on her 4 L and denies any shortness of breath, no headache, no fever, no chills, no urinary frequency, no hematuria, no nocturia, no dizziness or lightheadedness.  Her labs revealed elevated troponin at 850, potassium of 6.1, BUN of 71 and serum creatinine of 1.83 compared to baseline of 1.0.  She received 1 L IV fluid bolus in the ER as well as aspirin and will be admitted for further evaluation.  Initial CT scan of the head without contrast showed no acute intracranial abnormality.  Twelve-lead EKG shows sinus rhythm with a prolonged PR interval no acute ST or T wave changes."   Assessment / Plan / Recommendation Clinical Impression  Pt seen for cognitive-communication evaluation. Pt alert and cooperative. On 4L/min O2 via . Family friend/caregiver at bedside.   Oral motor exam completed and significant for xerostomia. No focal oral motor deficits appreciated.   Assessment completed via informal means and portions of Western Aphasia Battery Revised (Bedside Record Form). Pt presents with s/sx mild dysarthria c/b reduced vocal loudness at times. Pt benefited from cues to repeat self "louder" to improve intelligibility. To an unfamiliar listener, pt approx 80% intelligible. Caregiver reports understanding pt most of the time (>90%). Pt with cognitive-linguistic deficits affecting auditory comprehension for complex information (e.g. complex yes/no questions, 2-step commands, conversation), verbal expression (e.g. confrontation naming of low frequency objects, repetition of lengthy sentences), orientation (pt oriented to self, place only). Despite cognitive-communication and motor speech deficits, pt able to make needs known throughout evaluation. Of note, pt HOH (documented on 11/28/21 by Palliative Care) which may be affecting auditory comprehension and overall  results of assessment. Suspect pt with some degree of cognitive impairment PTA given LOA needed PTA; however, this was not confirmed by caregiver.  Based on today's assessment, anticipate need for frequent/constant supervision/assistance and post-acute SLP services for cognitive-communication and motor speech deficits.   SLP to continue to f/u while pt in house.   Pt, caregiver, and RN made aware of results, recommendations, and SLP POC. ?full understanding by pt. Family friend verbalized understanding/agreement.     SLP Assessment  SLP Recommendation/Assessment: Patient needs continued Speech Lanaguage Pathology Services SLP Visit Diagnosis: Dysarthria and anarthria (R47.1);Cognitive communication deficit (R41.841)    Recommendations for follow up therapy are one component of a multi-disciplinary discharge planning process, led by the attending physician.  Recommendations may be updated based on patient status, additional functional criteria and insurance authorization.    Follow Up Recommendations  Skilled nursing-short term rehab (<3 hours/day)    Assistance Recommended at Discharge  Frequent or constant Supervision/Assistance  Functional Status Assessment Patient has had a recent decline in their functional status and demonstrates the ability to make significant improvements in function in a reasonable and predictable amount of time.  Frequency and Duration min 2x/week  2 weeks      SLP  Evaluation Cognition  Arousal/Alertness: Awake/alert Orientation Level: Oriented to person;Oriented to place;Oriented to situation Memory: Impaired Memory Impairment: Storage deficit;Retrieval deficit       Comprehension  Auditory Comprehension Overall Auditory Comprehension: Impaired Yes/No Questions: Impaired Complex Questions: 50-74% accurate Commands: Impaired Complex Commands: 50-74% accurate Conversation: Complex Interfering Components: Hearing Visual  Recognition/Discrimination Discrimination: Not tested Reading Comprehension Reading Status: Not tested    Expression Expression Primary Mode of Expression: Verbal Verbal Expression Overall Verbal Expression: Impaired Initiation: No impairment Automatic Speech:  (WFL) Level of Generative/Spontaneous Verbalization: Sentence Repetition: Impaired Level of Impairment: Sentence level Naming: Impairment Confrontation: Impaired Convergent: 75-100% accurate Pragmatics: Impairment Impairments: Abnormal affect Written Expression Dominant Hand: Right Written Expression: Not tested   Oral / Motor  Oral Motor/Sensory Function Overall Oral Motor/Sensory Function: Within functional limits (xerostomia) Motor Speech Overall Motor Speech: Impaired Respiration: Impaired Level of Impairment: Sentence Phonation: Low vocal intensity Resonance: Within functional limits Articulation: Within functional limitis Intelligibility: Intelligibility reduced Word: 75-100% accurate Motor Planning: Witnin functional limits Effective Techniques: Increased vocal intensity           Cherrie Gauze, M.S., [facility] [phone] [place])  Quintella Baton 11/28/2021, 1:34 PM

## 2021-11-28 NOTE — Assessment & Plan Note (Addendum)
Involving R cerebellum, in setting of new onset A-fib, most likely embolic etiology. --Neurology consulted - see recs --ASA 81 mg, Lipitor 80 mg -- Start Eliquis 2.5 mg twice daily tomorrow 6/17 --Strict avoidance of hypotension --Monitored on telemetry --Follow up with Neurology --Stat head CT if changes to neurologic status  6/15: repeated head CT given recurrent episode on minimal responsiveness, worsened speech and diaphoresis, similar to occurrence prompting EMS call at SNF.  No hemorrhagic conversion.  No changes to recommendations.   6/16: Follow-up head CT prior to restarting anticoagulation tomorrow, unchanged and stable.  Patient is clinically stable and cleared for discharge to SNF/rehab today.

## 2021-11-28 NOTE — Progress Notes (Signed)
OT Cancellation Note  Patient Details Name: Jacqueline [name] MRN: [id number] DOB: 1933-01-07   Cancelled Treatment:    Reason Eval/Treat Not Completed: Other (comment). Pt is currently eating lunch and daughter requesting therapist to return later. OT will re-attempt when next available.   Darleen Crocker, [place], OTR/L , CBIS ascom [phone]  11/28/21, 2:46 PM

## 2021-11-28 NOTE — Consult Note (Cosign Needed)
Consultation Note Date: 11/28/2021   Patient Name: Jacqueline [name]  DOB: 11/23/1932  MRN: [id number]  Age / Sex: 88 y.o., female  PCP: Tracie Harrier, MD Referring Physician: Ezekiel Slocumb, DO  Reason for Consultation: Establishing goals of care  HPI/Patient Profile: 88 y.o. female  with past medical history of CHF, DM, HTN, HLD, and chronic oxygen use (4 L [place] at baseline) admitted on 11/26/2021 with slurred speech and lethargy.  Patient was admitted on 6 5 and discharged on 6/12 for CHF exacerbation.  [facility] before returning to the ED.  Patient is being treated for possible NSTEMI, UTI, and new onset of A-fib.  MRI on 6/14 revealed acute infarct of the right cerebellum.  Past TTE on 6 5 revealed EF of 20 to 25%.  Repeat echo on 6/14 revealed EF of 40 to 45%.  PMT was consulted to discuss goals of care.  Clinical Assessment and Goals of Care: I have reviewed medical records including EPIC notes, labs and imaging, assessed the patient and then met with patient and her caregiver Lattie Haw at bedside to discuss diagnosis prognosis, [place], EOL wishes, disposition and options. I then spoke with patient's [name] over the phone.   I introduced Palliative Medicine as specialized medical care for people living with serious illness. It focuses on providing relief from the symptoms and stress of a serious illness. The goal is to improve quality of life for both the patient and the family.  We discussed a brief life review of the patient.  Patient is [place].  She met her husband while he was serving in the [employer] in [place].  They have 2 children together, a son and a daughter.  Patient's husband passed away a few years ago.  Patient has been living independently ever since.  Patient has both her caregiver Lattie Haw and her [name] who provide cooking,  cleaning, and assistance with ADLs.  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  Lattie Haw shares patient was always well taking care of and her husband treated her "like a princess".  As far as functional and nutritional status patient was ambulating with a walker at home.  Lattie Haw says patient slept most of the day PTA. She also would frequently urinate on herself but would also toilet appropriately.  Patient was alert and oriented x4 prior to recent hospitalization.  She is hard of hearing but Lattie Haw shares patient's mind is fully intact.  We discussed patient's current illness and what it means in the larger context of patient's on-going co-morbidities.  Natural disease trajectory were discussed.  Reviewed that CHF is a chronic and irreversible condition.  I attempted to elicit values and goals of care important to the patient.  [name] says that what is important to the patient is that she is able to get home, eat her meals, walk with assistance to the bathroom, and be assisted but able to age at home.   Lattie Haw shares that she  and daughter [name] have a clear understanding that this could be the "beginning of the end" for the patient. [name] confirms DNR/DNI code status. She is also medical HCPOA and shares she can bring the documents in for scanning into EMR.   Family is facing anticipatory care needs.  [name] is reserving the patient's spot at [facility].  However, long-term goal is for patient to either come back to her own home or to live with [name]. [name] does not want patient to live long-term in a facility.   Discussed with patient/family the importance of continued conversation with family and the medical providers regarding overall plan of care and treatment options, ensuring decisions are within the context of the patient's values and GOCs.    Palliative Care services outpatient were explained. Pt is already established with [facility], who will contact pt after discharge.    Goals are clear. DNR/DNI remains. Daughter is surrogate Media planner. Pt has no complex symptom burden at this time. Questions and concerns were addressed. The patient/family was encouraged to call with questions or concerns.   PMT will shadow the patient's chart and re-engage if goals change, patient/family requests, or if patient health status declines.   Primary Decision Maker PATIENT  Code Status/Advance Care Planning: DNR  Prognosis:   Unable to determine  Discharge Planning: [place] for rehab with Palliative care service follow-up  Primary Diagnoses: Present on Admission:  NSTEMI (non-ST elevated myocardial infarction) ([place])  AKI (acute kidney injury) ([place])  Hyperkalemia  Hypothyroidism  Chronic systolic CHF (congestive heart failure) ([place])  Depression  UTI (urinary tract infection)  Slurred speech   Physical Exam Vitals and nursing note reviewed.  Constitutional:      General: She is not in acute distress.    Appearance: Normal appearance. She is not toxic-appearing.  HENT:     Head: Normocephalic and atraumatic.     Mouth/Throat:     Mouth: Mucous membranes are moist.  Eyes:     Pupils: Pupils are equal, round, and reactive to light.  Cardiovascular:     Rate and Rhythm: Rhythm irregular.     Pulses: Normal pulses.  Pulmonary:     Effort: Pulmonary effort is normal.     Comments: [place] in place Abdominal:     Palpations: Abdomen is soft.  Musculoskeletal:     Comments: Generalized weakness  Skin:    General: Skin is warm and dry.  Neurological:     Mental Status: She is alert.  Psychiatric:        Mood and Affect: Mood normal.        Behavior: Behavior normal.        Thought Content: Thought content normal.        Judgment: Judgment normal.     Palliative Assessment/Data: 50%     I discussed this patient's plan of care with patient, patient's caregiver Lattie Haw, RN Janett Billow.  Thank you for this consult. Palliative medicine will  continue to follow and assist holistically.   Time Total: 75 minutes Greater than 50%  of this time was spent counseling and coordinating care related to the above assessment and plan.  Signed by: Jordan Hawks, DNP, FNP-BC Palliative Medicine    Please contact Palliative Medicine Team phone at [phone] for questions and concerns.  For individual provider: See Shea Evans

## 2021-11-28 NOTE — Progress Notes (Signed)
Progress Note   [name] CBJ:[id number] DOB: [birth year] DOA: 11/26/2021     2 DOS: the patient was seen and examined on 11/28/2021   Brief hospital course: 88 year old female with past medical history of chronic systolic CHF with last EF 20 to 25%, chronic respiratory failure with hypoxia due to COPD on 4 L/min continuous O2 at baseline, type II diabetes with renal complications CKD stage IIIa, GERD, hypothyroidism who was discharged from the hospital on 6/12 to rehab but returned to the ER less than 24 hours later for evaluation of multiple complaints including lethargy, slurred speech diaphoresis with left shoulder pain radiating to the arm and back pain, no specific complaint of chest pain however.  ED evaluation revealed elevated troponin up to 850, potassium 6.1, creatinine 1.83 up from baseline 1.0.  Noncontrast head CT was unremarkable.  EKG was nonacute, showed prolonged PR interval.  MRI brain was obtained subsequently and showed acute right cerebellar infarct.  Admitted to the hospital with consults for cardiology and neurology.  Assessment and Plan: * NSTEMI (non-ST elevated myocardial infarction) ([place]) Presented for evaluation of diaphoresis, left shoulder/upper back pain and chest pain. Initial troponin is elevated at 850. --Cardiology is consulted, follow-up recommendations --Hold off on heparin given acute stroke --Continue aspirin and statin --Hold carvedilol for now due to relative hypotension  Acute ischemic stroke [facility]) Involving R cerebellum, in setting of new onset A-fib. --Neurology consulted - see recs --ASA 81 mg, Lipitor 80 mg --Start Eliquis on 6/17 after repeat head CT that day --Perm HTN x 48 hours (BP's have been low without antihypertensives).  Given small fluid bolus today (6/15) --A-fib mgmt per cardiology --Telemetry --Follow up with Neurology --Stat head CT if changes to neurologic status   6/15: repeated head CT given recurrent episode  on minimal responsiveness, worsened speech and diaphoresis, similar to occurrence prompting EMS call at SNF.  No hemorrhagic conversion.  No changes to Neurology's recommendations.   New onset a-fib [facility]) Patient was noted in A-fib around 4:20 AM this morning.  No known prior history per chart review.  This could be etiology of her stroke. -- Monitor on telemetry -- Cardiology is consulted -- Holding off anticoagulation given acute stroke... per Neurology, start anticoagulation on 6/17.  Slurred speech Daughter reported slurred speech following episode of unresponsiveness and significant lethargy on morning of 6/13.  This prompted EMS call to bring patient in for evaluation. Unknown last known well time Noncontrast CT head negative for bleed. MRI brain did not show acute infarct in the right cerebellum  --See Acute Ischemic Stroke for plan   Chronic respiratory failure with hypoxia (HCC) Stable and at baseline. Continue oxygen supplementation at 4 L to maintain pulse oximetry greater than 92%  AKI (acute kidney injury) ([place]) Most likely secondary to over diuresis, rule out cardiorenal syndrome Baseline serum creatinine of 1.0. Creatinine on admission 1.83 with associated uremia. Noted to have relative hypotension with SBP of 90.  Given1 L of IV fluids in the ER. --Hold off further IV fluids given low EF --Hold torsemide and Entresto --Monitor BMP daily -- Avoid nephrotoxins and hypotension Renally dose meds as indicated  Hyperkalemia Noted to have hyperkalemia (K6.1) without any EKG changes.  Treated in the ED with dextrose, insulin and a dose of Lokelma. --Hold oral potassium supplements as well as Entresto --Monitor BMP  Hypothyroidism Stable.  Continue Synthroid.  Chronic systolic CHF (congestive heart failure) (HCC) Patient has a history of chronic systolic heart failure with LVEF  of 20 to 25% on most recent echo. Patient had just been discharged from the hospital on  06/12, after diuresis for acute decompensation.   Given IV fluids in the ER due to hypotension and appearing hypovolemic. --Hold torsemide, Entresto and carvedilol --Monitor volume status closely -- Daily weights -- Follow cardiology's recommendations  CKD stage 3 due to type 2 diabetes mellitus ([place]) With AKI on admission, likely secondary to hypotension, possibly overdiuresis during recent admission.   -- BMP  Depression Stable. Continue sertraline.  UTI (urinary tract infection) UA on admission with pyuria but patient denied having any urinary symptoms. Given Rocephin 1 g IV in the ER. --treated with 2 more doses of IV Rocephin --urine culture grew Citrobacter braakii resistant only to cefazolin         Subjective: Patient seen near end of working with therapy.  She became acutely diaphoretic and less responsive.  Pt will open her eyes for me when I ask, otherwise lethargic.  Sent for repeat head CT which ruled out hemorrhagic conversion of the prior stroke.  Physical Exam: Vitals:   11/28/21 0353 11/28/21 0818 11/28/21 1108 11/28/21 1609  BP: (!) 113/56 117/65 117/60 129/61  Pulse: 65 68 63 79  Resp: '18 17 18 '$ (!) 23  Temp: (!) 97.5 F (36.4 C) 98.6 F (37 C) 98.5 F (36.9 C) 98.5 F (36.9 C)  TempSrc:      SpO2: 97% 99% 100% 100%  Weight:      Height:       General exam: lethargic but responsive, no acute distress HEENT: eyes closed but opens eyes on command, moist mucus membranes Respiratory system: lungs clear b/l, On room air, normal respiratory effort. Cardiovascular system: RRR, no peripheral edema.   Central nervous system: follows commands, reduced alterness, alert and awake Extremities: moves all, no edema, normal tone Skin: diaphoretic, normal temperature, intact, no rashes seen on visualized skin Psychiatry: unable to assess due to reduced alertness, minimal interaction   Data Reviewed:  Notable labs: glucose 105, BUN 51, Ca 8.7, GFR 58, Hbg  15.1  Head CT without contrast today -- no hemorrhagic conversion of prior stroke.  Non-acute.  Family Communication: Family friend/caregiver at bedside on rounds.  Daughter at bedside yesterday.  Disposition: Status is: Inpatient Remains inpatient appropriate because: Ongoing evaluation not appropriate for the outpatient setting as outlined above.  Discharge pending clearance by cardiology and neurology.   Planned Discharge Destination: Skilled nursing facility    Time spent: 40 minutes  Author: Ezekiel Slocumb, DO 11/28/2021 6:42 PM  For on call review [email or web address].

## 2021-11-28 NOTE — Progress Notes (Signed)
[facility] Hospital Liaison note:  This patient is currently enrolled in [facility] outpatient-based Palliative Care. Will continue to follow for disposition.  Please call with any outpatient palliative questions or concerns.  Thank you, Dee Curry, LPN [facility] Hospital Liaison [phone] 

## 2021-11-28 NOTE — Progress Notes (Signed)
[facility] Cardiology    SUBJECTIVE: Patient feels somewhat better mild deficits from CVA with minimal slurring of speech minimal cognitiveDenies any chest pain no palpitations or tachycardia tolerating treatment well currently on supplemental oxygen no fever chills or sweats  Vitals:   11/27/21 1434 11/27/21 1944 11/27/21 2343 11/28/21 0353  BP: (!) 99/57 (!) 124/51 (!) 102/54 (!) 113/56  Pulse: 77 73 76 65  Resp: '17 18 18 18  '$ Temp: 98.3 F (36.8 C) 97.6 F (36.4 C) (!) 97.5 F (36.4 C) (!) 97.5 F (36.4 C)  TempSrc: Oral     SpO2: 98% 100% (!) 82% 97%  Weight:      Height:         Intake/Output Summary (Last 24 hours) at 11/28/2021 0756 Last data filed at 11/28/2021 0417 Gross per 24 hour  Intake 480 ml  Output 950 ml  Net -470 ml      PHYSICAL EXAM  General: Well developed, well nourished, in no acute distress HEENT:  Normocephalic and atramatic Neck:  No JVD.  Lungs: Clear bilaterally to auscultation and percussion. Heart: Irregular irregular normal S1 and S2 without gallops or murmurs.  Abdomen: Bowel sounds are positive, abdomen soft and non-tender  Msk:  Back normal, normal gait. Normal strength and tone for age. Extremities: No clubbing, cyanosis or edema.   Neuro: Alert and oriented X 3. Psych:  Good affect, responds appropriately   LABS: Basic Metabolic Panel: Recent Labs    11/26/21 1223 11/26/21 1436 11/27/21 0613 11/27/21 0655 11/28/21 0612  NA 134*   < > 137  --  139  K 6.1*   < > 4.6  --  3.9  CL 93*   < > 100  --  104  CO2 30   < > 25  --  27  GLUCOSE 156*   < > 117*  --  105*  BUN 71*   < > 66*  --  51*  CREATININE 1.83*   < > 1.34*  --  0.95  CALCIUM 8.8*   < > 8.8*  --  8.7*  MG 2.7*  --   --  2.6*  --    < > = values in this interval not displayed.   Liver Function Tests: Recent Labs    11/26/21 1223  AST 34  ALT 14  ALKPHOS 51  BILITOT 1.3*  PROT 7.0  ALBUMIN 3.5   No results for input(s): "LIPASE", "AMYLASE" in the last 72  hours. CBC: Recent Labs    11/26/21 1223 11/27/21 0613 11/28/21 0612  WBC 8.2 7.2 6.1  NEUTROABS 5.5  --   --   HGB 16.3* 15.8* 15.1*  HCT 51.5* 49.6* 47.5*  MCV 91.0 92.9 91.9  PLT 142* 119* 167   Cardiac Enzymes: No results for input(s): "CKTOTAL", "CKMB", "CKMBINDEX", "TROPONINI" in the last 72 hours. BNP: Invalid input(s): "POCBNP" D-Dimer: No results for input(s): "DDIMER" in the last 72 hours. Hemoglobin A1C: No results for input(s): "HGBA1C" in the last 72 hours. Fasting Lipid Panel: Recent Labs    11/27/21 0613  CHOL 262*  HDL 34*  LDLCALC 165*  TRIG 315*  CHOLHDL 7.7   Thyroid Function Tests: No results for input(s): "TSH", "T4TOTAL", "T3FREE", "THYROIDAB" in the last 72 hours.  Invalid input(s): "FREET3" Anemia Panel: No results for input(s): "VITAMINB12", "FOLATE", "FERRITIN", "TIBC", "IRON", "RETICCTPCT" in the last 72 hours.  ECHOCARDIOGRAM COMPLETE  Result Date: 11/28/2021    ECHOCARDIOGRAM REPORT   Patient Name:   Jacqueline SHIFRIN  [name] Date of Exam: 11/27/2021 Medical Rec #:  [id number]     Height:       57.0 in Accession #:    [id number]    Weight:       134.3 lb Date of Birth:  Dec 01, 1932     BSA:          1.518 m Patient Age:    88 years      BP:           99/57 mmHg Patient Gender: F             HR:           72 bpm. Exam Location:  [facility] Procedure: 2D Echo, Cardiac Doppler and Color Doppler Indications:     I48.91 Atrial Fibrillation  History:         Patient has prior history of Echocardiogram examinations, most                  recent 11/18/2021. CHF and Cardiomyopathy; Risk                  Factors:Hypertension, Dyslipidemia and Diabetes.                  Hypothyroidism.  Sonographer:     Cresenciano Lick RDCS Referring Phys:  [id number] Joshua Soulier D Shawne Bulow Diagnosing Phys: Yolonda Kida MD IMPRESSIONS  1. Left ventricular ejection fraction, by estimation, is 40 to 45%. The left ventricle has mildly decreased function. The left ventricle demonstrates global  hypokinesis. There is mild concentric left ventricular hypertrophy. Left ventricular diastolic parameters are consistent with Grade I diastolic dysfunction (impaired relaxation).  2. Right ventricular systolic function is low normal. The right ventricular size is mildly enlarged.  3. The mitral valve is grossly normal. Mild mitral valve regurgitation.  4. The aortic valve is grossly normal. Aortic valve regurgitation is mild. FINDINGS  Left Ventricle: Left ventricular ejection fraction, by estimation, is 40 to 45%. The left ventricle has mildly decreased function. The left ventricle demonstrates global hypokinesis. The left ventricular internal cavity size was normal in size. There is  mild concentric left ventricular hypertrophy. Left ventricular diastolic parameters are consistent with Grade I diastolic dysfunction (impaired relaxation). Right Ventricle: The right ventricular size is mildly enlarged. No increase in right ventricular wall thickness. Right ventricular systolic function is low normal. Left Atrium: Left atrial size was normal in size. Right Atrium: Right atrial size was normal in size. Pericardium: There is no evidence of pericardial effusion. Mitral Valve: The mitral valve is grossly normal. Mild mitral valve regurgitation. Tricuspid Valve: The tricuspid valve is grossly normal. Tricuspid valve regurgitation is mild. Aortic Valve: The aortic valve is grossly normal. Aortic valve regurgitation is mild. Aortic regurgitation PHT measures 330 msec. Pulmonic Valve: The pulmonic valve was normal in structure. Pulmonic valve regurgitation is not visualized. Aorta: The ascending aorta was not well visualized. IAS/Shunts: No atrial level shunt detected by color flow Doppler.  LEFT VENTRICLE PLAX 2D LVIDd:         4.10 cm   Diastology LVIDs:         3.20 cm   LV e' lateral:   3.92 cm/s LV PW:         1.40 cm   LV E/e' lateral: 28.6 LV IVS:        1.00 cm LVOT diam:     2.00 cm LV SV:         41 LV SV Index:  27 LVOT Area:     3.14 cm  RIGHT VENTRICLE            IVC RV Basal diam:  4.50 cm    IVC diam: 1.70 cm RV S prime:     6.96 cm/s TAPSE (M-mode): 1.6 cm LEFT ATRIUM             Index        RIGHT ATRIUM           Index LA diam:        4.00 cm 2.63 cm/m   RA Area:     14.40 cm LA Vol (A2C):   50.3 ml 33.13 ml/m  RA Volume:   38.70 ml  25.49 ml/m LA Vol (A4C):   54.3 ml 35.76 ml/m LA Biplane Vol: 56.3 ml 37.08 ml/m  AORTIC VALVE LVOT Vmax:   65.45 cm/s LVOT Vmean:  47.850 cm/s LVOT VTI:    0.132 m AI PHT:      330 msec  AORTA Ao Root diam: 3.20 cm Ao Asc diam:  3.60 cm MV E velocity: 112.00 cm/s                             SHUNTS                             Systemic VTI:  0.13 m                             Systemic Diam: 2.00 cm Yolonda Kida MD Electronically signed by Yolonda Kida MD Signature Date/Time: 11/28/2021/6:42:04 AM    Final    MR ANGIO HEAD WO CONTRAST  Result Date: 11/28/2021 CLINICAL DATA:  Follow-up examination for acute stroke. EXAM: MRA NECK WITHOUT CONTRAST MRA HEAD WITHOUT CONTRAST TECHNIQUE: Angiographic images of the Circle of Willis were acquired using MRA technique without intravenous contrast. COMPARISON:  Previous study from 11/26/2021. FINDINGS: MRA NECK FINDINGS AORTIC ARCH: Examination technically limited by lack of IV contrast and motion artifact. Aortic arch and origin of the great vessels not assessed on this exam. RIGHT CAROTID SYSTEM: Visualized right common and internal carotid arteries patent with antegrade flow. No visible hemodynamically significant stenosis. No findings to suggest dissection. LEFT CAROTID SYSTEM: Visualized left common and internal carotid arteries patent with antegrade flow. No visible hemodynamically significant stenosis. No findings to suggest dissection. VERTEBRAL ARTERIES: Both vertebral arteries appear to arise from the subclavian arteries, although the proximal vertebral arteries are not well assessed on this noncontrast examination.  Antegrade flow seen within both vertebral arteries within the neck. No visible significant stenosis or evidence for dissection. MRA HEAD FINDINGS ANTERIOR CIRCULATION: Examination mildly degraded by motion artifact. Both internal carotid arteries patent to the termini without stenosis or other abnormality. A1 segments patent. Normal anterior communicating artery complex. Anterior cerebral arteries patent without stenosis. No M1 stenosis or occlusion. No proximal MCA branch occlusion. Distal MCA branches perfused and symmetric. POSTERIOR CIRCULATION: Visualized distal V4 segments are somewhat diminutive but largely code dominant and patent to the vertebrobasilar junction without stenosis. Neither PICA origin well seen or assessed on this exam. Basilar diminutive but patent to its distal aspect without stenosis. Superior cerebellar arteries patent bilaterally. 3 mm aneurysm seen extending superiorly from the basilar tip (series 5, image 89). Fetal type origin of the PCAs bilaterally. Both PCAs  patent to their distal aspects without appreciable stenosis. IMPRESSION: 1. Negative MRA of the head and neck for large vessel occlusion or other acute abnormality. 2. 3 mm basilar tip aneurysm. 3. Fetal type origin of the PCAs with overall diminutive vertebrobasilar system. Electronically Signed   By: Jeannine Boga M.D.   On: 11/28/2021 02:08   MR ANGIO NECK WO CONTRAST  Result Date: 11/28/2021 CLINICAL DATA:  Follow-up examination for acute stroke. EXAM: MRA NECK WITHOUT CONTRAST MRA HEAD WITHOUT CONTRAST TECHNIQUE: Angiographic images of the Circle of Willis were acquired using MRA technique without intravenous contrast. COMPARISON:  Previous study from 11/26/2021. FINDINGS: MRA NECK FINDINGS AORTIC ARCH: Examination technically limited by lack of IV contrast and motion artifact. Aortic arch and origin of the great vessels not assessed on this exam. RIGHT CAROTID SYSTEM: Visualized right common and internal carotid  arteries patent with antegrade flow. No visible hemodynamically significant stenosis. No findings to suggest dissection. LEFT CAROTID SYSTEM: Visualized left common and internal carotid arteries patent with antegrade flow. No visible hemodynamically significant stenosis. No findings to suggest dissection. VERTEBRAL ARTERIES: Both vertebral arteries appear to arise from the subclavian arteries, although the proximal vertebral arteries are not well assessed on this noncontrast examination. Antegrade flow seen within both vertebral arteries within the neck. No visible significant stenosis or evidence for dissection. MRA HEAD FINDINGS ANTERIOR CIRCULATION: Examination mildly degraded by motion artifact. Both internal carotid arteries patent to the termini without stenosis or other abnormality. A1 segments patent. Normal anterior communicating artery complex. Anterior cerebral arteries patent without stenosis. No M1 stenosis or occlusion. No proximal MCA branch occlusion. Distal MCA branches perfused and symmetric. POSTERIOR CIRCULATION: Visualized distal V4 segments are somewhat diminutive but largely code dominant and patent to the vertebrobasilar junction without stenosis. Neither PICA origin well seen or assessed on this exam. Basilar diminutive but patent to its distal aspect without stenosis. Superior cerebellar arteries patent bilaterally. 3 mm aneurysm seen extending superiorly from the basilar tip (series 5, image 89). Fetal type origin of the PCAs bilaterally. Both PCAs patent to their distal aspects without appreciable stenosis. IMPRESSION: 1. Negative MRA of the head and neck for large vessel occlusion or other acute abnormality. 2. 3 mm basilar tip aneurysm. 3. Fetal type origin of the PCAs with overall diminutive vertebrobasilar system. Electronically Signed   By: Jeannine Boga M.D.   On: 11/28/2021 02:08   MR BRAIN WO CONTRAST  Result Date: 11/26/2021 CLINICAL DATA:  Mental status change,  unknown cause EXAM: MRI HEAD WITHOUT CONTRAST TECHNIQUE: Multiplanar, multiecho pulse sequences of the brain and surrounding structures were obtained without intravenous contrast. COMPARISON:  Same day CT head. FINDINGS: Brain: Acute infarct in the right cerebellum. Mild associated edema without mass effect. No evidence of acute hemorrhage, mass lesion, midline shift, or extra-axial fluid collection. Cerebral atrophy. Mild to moderate scattered T2/FLAIR hyperintensities in the white matter, nonspecific but compatible with chronic microvascular disease. Vascular: Major arterial flow voids are maintained at the skull base. Skull and upper cervical spine: Normal marrow signal. Sinuses/Orbits: Clear sinuses.  No acute orbital findings. Other: No sizable mastoid effusions IMPRESSION: Acute infarct in the right cerebellum. Mild associated edema without mass effect. Electronically Signed   By: Margaretha Sheffield M.D.   On: 11/26/2021 15:37   CT HEAD WO CONTRAST (5MM)  Result Date: 11/26/2021 CLINICAL DATA:  Altered mental status. EXAM: CT HEAD WITHOUT CONTRAST TECHNIQUE: Contiguous axial images were obtained from the base of the skull through the vertex without intravenous contrast.  RADIATION DOSE REDUCTION: This exam was performed according to the departmental dose-optimization program which includes automated exposure control, adjustment of the mA and/or kV according to patient size and/or use of iterative reconstruction technique. COMPARISON:  January 05, 2019. FINDINGS: Brain: Mild diffuse cortical atrophy is noted. Mild chronic ischemic white matter disease is noted. No mass effect or midline shift is noted. Ventricular size is within normal limits. There is no evidence of mass lesion, hemorrhage or acute infarction. Vascular: No hyperdense vessel or unexpected calcification. Skull: Normal. Negative for fracture or focal lesion. Sinuses/Orbits: No acute finding. Other: None. IMPRESSION: No acute intracranial  abnormality seen. Electronically Signed   By: Marijo Conception M.D.   On: 11/26/2021 13:49   DG Chest Portable 1 View  Result Date: 11/26/2021 CLINICAL DATA:  Chest pain.  Weakness. EXAM: PORTABLE CHEST 1 VIEW COMPARISON:  11/24/2021 FINDINGS: The heart size and mediastinal contours are within normal limits. Aortic atherosclerotic calcification incidentally noted. Both lungs are clear. Chrondrous bone lesion again seen in the proximal left humeral metaphysis. IMPRESSION: No active disease. Electronically Signed   By: Marlaine Hind M.D.   On: 11/26/2021 12:36     Echo mildly reduced left ventricular function EF around 40-45%  TELEMETRY: Atrial fibrillation rate of 70 nonspecific ST-T wave changes:  ASSESSMENT AND PLAN:  Principal Problem:   NSTEMI (non-ST elevated myocardial infarction) (HCC) Active Problems:   Chronic systolic CHF (congestive heart failure) (HCC)   Hypothyroidism   Chronic respiratory failure (HCC)   CKD stage 3 due to type 2 diabetes mellitus (HCC)   AKI (acute kidney injury) ([place])   Hyperkalemia   Depression   UTI (urinary tract infection)   Slurred speech   New onset a-fib ([place])    Plan Continue current management on telemetry Anticoagulation for atrial fibrillation transition from heparin to probably Eliquis prior to discharge Have patient follow-up with nephrology for renal insufficiency Continue broad-spectrum antibiotic therapy for what appears to be UTI Recommend neurology input for CVA patient may benefit from physical therapy occupational therapy Chronic respiratory failure continue supplemental oxygen of at least 4 L Elevated troponin possibly demand ischemia from CVA do not recommend invasive procedure echocardiogram with mildly reduced left ventricular function    Yolonda Kida, MD 11/28/2021 7:56 AM

## 2021-11-28 NOTE — Progress Notes (Signed)
Physical Therapy Treatment Patient Details Name: Jacqueline [name] MRN: [id number] DOB: 1932-06-30 Today's Date: 11/28/2021   History of Present Illness Jacqueline [name] is a 88 y.o. female with medical history significant for chronic systolic CHF with last known LVEF of 20 to 25%, COPD with chronic respiratory failure on 4 L of oxygen continuous, diabetes mellitus with complications of stage III chronic kidney disease, GERD, hypothyroidism who was discharged from the hospital on 11/25/21 and returned to the ER within 24 hours for evaluation of multiple symptoms. Pt with new acute infarct R cerebellum.    PT Comments    Pt received in bed, caregiver at bedside. Pt on 4L O2 with sats at 94-96%, HR 81bpm. Pt assisted to EOB and at this time noted to be very diaphoretic with cool and clammy skin. No change in HR from supine to sitting edge of bed for 3 minutes, however pt noted to be starring ahead with minimal and delayed verbal response to questions. No c/o dizziness. Pt assisted back to supine where she quickly fell asleep. Nursing and MD notified, Pt to receive head CT this afternoon.   Recommendations for follow up therapy are one component of a multi-disciplinary discharge planning process, led by the attending physician.  Recommendations may be updated based on patient status, additional functional criteria and insurance authorization.  Follow Up Recommendations  Skilled nursing-short term rehab (<3 hours/day)     Assistance Recommended at Discharge Intermittent Supervision/Assistance  Patient can return home with the following A little help with walking and/or transfers;A little help with bathing/dressing/bathroom;Assistance with cooking/housework;Help with stairs or ramp for entrance   Equipment Recommendations  Rolling walker (2 wheels);BSC/3in1    Recommendations for Other Services       Precautions / Restrictions Precautions Precautions: Fall     Mobility  Bed Mobility Overal bed  mobility: Needs Assistance Bed Mobility: Supine to Sit     Supine to sit: Min assist, Mod assist     General bed mobility comments: verbal cuing for hand placement and minA to scoot EOB    Transfers                   General transfer comment: deferred due to altered mental status    Ambulation/Gait                   Stairs             Wheelchair Mobility    Modified Rankin (Stroke Patients Only)       Balance                                            Cognition Arousal/Alertness: Lethargic Behavior During Therapy: Flat affect Overall Cognitive Status: Impaired/Different from baseline Area of Impairment: Attention, Awareness (Pt able to state DOB without difficulty with eyes remaining closed)                   Current Attention Level: Alternating       Awareness: Emergent   General Comments: Pt more fatigued today with difficulty keeping eyes open and responding to questions        Exercises      General Comments General comments (skin integrity, edema, etc.): upon assisting pt to EOB pt was noted to be very diaphoretic, cool and clammy. No c/o dizziness with change of position  Pertinent Vitals/Pain Pain Assessment Pain Assessment: No/denies pain    Home Living     Available Help at Discharge: Family;Personal care attendant;Available PRN/intermittently Type of Home: House                  Prior Function            PT Goals (current goals can now be found in the care plan section) Acute Rehab PT Goals Patient Stated Goal: none stated    Frequency    Min 2X/week      PT Plan Current plan remains appropriate    Co-evaluation              AM-PAC PT "6 Clicks" Mobility   Outcome Measure  Help needed turning from your back to your side while in a flat bed without using bedrails?: A Little Help needed moving from lying on your back to sitting on the side of a flat bed  without using bedrails?: A Little Help needed moving to and from a bed to a chair (including a wheelchair)?: A Little Help needed standing up from a chair using your arms (e.g., wheelchair or bedside chair)?: A Little Help needed to walk in hospital room?: A Lot Help needed climbing 3-5 steps with a railing? : A Lot 6 Click Score: 16    End of Session Equipment Utilized During Treatment: Oxygen (4L O2) Activity Tolerance: Patient limited by lethargy Patient left: in bed;with call bell/phone within reach;with nursing/sitter in room Nurse Communication: Other (comment) (concerns regarding change in appearance from previous day) PT Visit Diagnosis: Difficulty in walking, not elsewhere classified (R26.2);Muscle weakness (generalized) (M62.81);Unsteadiness on feet (R26.81)     Time: 9476-5465 PT Time Calculation (min) (ACUTE ONLY): 29 min  Charges:  $Therapeutic Activity: 23-37 mins                    Mikel Cella, PTA    Josie Dixon 11/28/2021, 1:54 PM

## 2021-11-29 ENCOUNTER — Encounter: Payer: Self-pay | Admitting: Internal Medicine

## 2021-11-29 ENCOUNTER — Inpatient Hospital Stay: Payer: Medicare Other

## 2021-11-29 DIAGNOSIS — E039 Hypothyroidism, unspecified: Secondary | ICD-10-CM

## 2021-11-29 DIAGNOSIS — I214 Non-ST elevation (NSTEMI) myocardial infarction: Secondary | ICD-10-CM | POA: Diagnosis not present

## 2021-11-29 DIAGNOSIS — E119 Type 2 diabetes mellitus without complications: Secondary | ICD-10-CM

## 2021-11-29 LAB — BASIC METABOLIC PANEL
Anion gap: 5 (ref 5–15)
BUN: 37 mg/dL — ABNORMAL HIGH (ref 8–23)
CO2: 28 mmol/L (ref 22–32)
Calcium: 8.4 mg/dL — ABNORMAL LOW (ref 8.9–10.3)
Chloride: 106 mmol/L (ref 98–111)
Creatinine, Ser: 0.89 mg/dL (ref 0.44–1.00)
GFR, Estimated: >60 mL/min (ref >60)
Glucose, Bld: 120 mg/dL — ABNORMAL HIGH (ref 70–99)
Potassium: 4.2 mmol/L (ref 3.5–5.1)
Sodium: 139 mmol/L (ref 135–145)

## 2021-11-29 LAB — GLUCOSE, CAPILLARY
Glucose-Capillary: 134 mg/dL — ABNORMAL HIGH (ref 70–99)
Glucose-Capillary: 137 mg/dL — ABNORMAL HIGH (ref 70–99)
Glucose-Capillary: 166 mg/dL — ABNORMAL HIGH (ref 70–99)
Glucose-Capillary: 149 mg/dL — ABNORMAL HIGH (ref 70–99)
Glucose-Capillary: 131 mg/dL — ABNORMAL HIGH (ref 70–99)

## 2021-11-29 MED ORDER — ACETAMINOPHEN 325 MG PO TABS: 650.0000 mg | ORAL_TABLET | ORAL | Status: DC | PRN

## 2021-11-29 MED ORDER — APIXABAN 2.5 MG PO TABS: 2.5000 mg | ORAL_TABLET | Freq: Two times a day (BID) | ORAL | Status: DC

## 2021-11-29 MED ORDER — SENNOSIDES-DOCUSATE SODIUM 8.6-50 MG PO TABS
1.0000 | ORAL_TABLET | Freq: Every evening | ORAL | Status: DC | PRN
Start: 2021-11-29 — End: 2022-04-28

## 2021-11-29 MED ORDER — ASPIRIN 81 MG PO CHEW: 81.0000 mg | CHEWABLE_TABLET | Freq: Every day | ORAL | Status: DC

## 2021-11-29 MED ORDER — ATORVASTATIN CALCIUM 80 MG PO TABS: 80.0000 mg | ORAL_TABLET | Freq: Every day | ORAL | Status: DC

## 2021-11-29 NOTE — Care Management Important Message (Signed)
Important Message  Patient Details  Name: Jacqueline [name] MRN: [id number] Date of Birth: Sep 27, 1932   Medicare Important Message Given:  Yes     Dannette Barbara 11/29/2021, 4:12 PM

## 2021-11-29 NOTE — Discharge Summary (Addendum)
Physician Discharge Summary   [name] MRN: [id number] DOB: 1933-03-31  Admit date:     11/26/2021  Discharge date: 11/29/21  Discharge Physician: Ezekiel Slocumb   PCP: Tracie Harrier, MD   Recommendations at discharge:   Follow up with Cardiology in 1-2 weeks Follow up with Primary Care within 1 week Follow up on Blood Pressure.  Multiple medications held during admission were stopped at d/c to avoid hypotension (Coreg, Imdur, Demadex, Entresto) Follow-up with neurology  Discharge Diagnoses: Principal Problem:   NSTEMI (non-ST elevated myocardial infarction) ([place]) Active Problems:   Slurred speech   New onset a-fib ([place])   Acute ischemic stroke (HCC)   Chronic respiratory failure with hypoxia (HCC)   Hypothyroidism   Chronic systolic CHF (congestive heart failure) (HCC)   CKD stage 3 due to type 2 diabetes mellitus ([place])   Depression   UTI (urinary tract infection)   Type 2 diabetes mellitus ([place])  Resolved Problems:   AKI (acute kidney injury) ([place])   Hyperkalemia  Hospital Course: 88 year old female with past medical history of chronic systolic CHF with last EF 20 to 25%, chronic respiratory failure with hypoxia due to COPD on 4 L/min continuous O2 at baseline, type II diabetes with renal complications CKD stage IIIa, GERD, hypothyroidism who was discharged from the hospital on 6/12 to rehab but returned to the ER less than 24 hours later for evaluation of multiple complaints including lethargy, slurred speech diaphoresis with left shoulder pain radiating to the arm and back pain, no specific complaint of chest pain however.  ED evaluation revealed elevated troponin up to 850, potassium 6.1, creatinine 1.83 up from baseline 1.0.  Noncontrast head CT was unremarkable.  EKG was nonacute, showed prolonged PR interval.  MRI brain was obtained subsequently and showed acute right cerebellar infarct.  Admitted to the hospital with consults for cardiology and  neurology.  Assessment and Plan: * NSTEMI (non-ST elevated myocardial infarction) ([place]) Presented for evaluation of diaphoresis, left shoulder/upper back pain and chest pain. Initial troponin is elevated at 850. --Cardiology is consulted, follow-up recommendations -- Heparin infusion deferred given acute stroke --Continue aspirin and statin -- Coreg held during admission for permissive hypertension, holding at discharge to avoid hypotension  Acute ischemic stroke [facility]) Involving R cerebellum, in setting of new onset A-fib, most likely embolic etiology. --Neurology consulted - see recs --ASA 81 mg, Lipitor 80 mg -- Start Eliquis 2.5 mg twice daily tomorrow 6/17 --Strict avoidance of hypotension --Monitored on telemetry --Follow up with Neurology --Stat head CT if changes to neurologic status  6/15: repeated head CT given recurrent episode on minimal responsiveness, worsened speech and diaphoresis, similar to occurrence prompting EMS call at SNF.  No hemorrhagic conversion.  No changes to recommendations.   6/16: Follow-up head CT prior to restarting anticoagulation tomorrow, unchanged and stable.  Patient is clinically stable and cleared for discharge to SNF/rehab today.  New onset a-fib [facility]) Patient was noted in A-fib around 4:20 AM morning following admission.  No known prior history per chart review.  Most likely her stroke was embolic secondary to underlying A-fib. -- Monitor on telemetry -- Cardiology consulted -- We have held off anticoagulation given acute stroke... per Neurology, start anticoagulation on 6/17.  Patient meets criteria for reduced dose of Eliquis -- Start Eliquis 2.5 mg p.o. twice daily tomorrow 6/17 -- Follow-up with cardiology  Slurred speech Daughter reported slurred speech following episode of unresponsiveness and significant lethargy on morning of 6/13.  This prompted EMS  call to bring patient in for evaluation. Unknown last known well time Noncontrast  CT head negative for bleed. MRI brain did not show acute infarct in the right cerebellum  --See Acute Ischemic Stroke for plan   Chronic respiratory failure with hypoxia (HCC) Stable and at baseline. Continue oxygen supplementation at 4 L to maintain pulse oximetry greater than 92%  AKI (acute kidney injury) (HCC)-resolved as of 11/29/2021 Most likely secondary to over diuresis, rule out cardiorenal syndrome Baseline serum creatinine of 1.0. Creatinine on admission 1.83 with associated uremia. Noted to have relative hypotension with SBP of 90.  Given1 L of IV fluids in the ER. --Hold off further IV fluids given low EF --Hold torsemide and Entresto --Monitor BMP daily -- Avoid nephrotoxins and hypotension Renally dose meds as indicated  Hyperkalemia-resolved as of 11/29/2021 Noted to have hyperkalemia (K6.1) without any EKG changes.  Treated in the ED with dextrose, insulin and a dose of Lokelma. --Hold oral potassium supplements as well as Entresto -- Repeat BMP in 1 week  Hypothyroidism Stable.  Continue Synthroid.  Chronic systolic CHF (congestive heart failure) (HCC) Patient has a history of chronic systolic heart failure with LVEF of 20 to 25% on most recent echo. Patient had just been discharged from the hospital on 06/12, after diuresis for acute decompensation.   Given IV fluids in the ER due to hypotension and appearing hypovolemic. --Hold torsemide, Entresto, Imdur and Coreg --Since BPs overall are controlled and need to strictly avoid hypotension in the setting of her stroke, above meds are also held at time of discharge --Close cardiology follow-up in 1 to 2 weeks -- Daily weights  CKD stage 3 due to type 2 diabetes mellitus ([place]) With AKI on admission, likely secondary to hypotension, possibly overdiuresis during recent admission.   -- BMP  Depression Stable. Continue sertraline.  UTI (urinary tract infection) UA on admission with pyuria but patient denied  having any urinary symptoms. Given Rocephin 1 g IV in the ER. Continued and completed course of Rocephin Urine culture grew Citrobacter braakii resistant only to cefazolin   Type 2 diabetes mellitus ([place]) Hemoglobin A1c 6.5%. Covered with sliding scale NovoLog. Resume Jardiance at discharge.         Consultants: Neurology, Cardiology Procedures performed: Echocardiogram 11/27/2021  Disposition: Skilled nursing facility  Diet recommendation:   Discharge Diet Orders (From admission, onward)     Start     Ordered   11/29/21 0000  Diet - low sodium heart healthy        11/29/21 1513          Cardiac diet   DISCHARGE MEDICATION: Allergies as of 11/29/2021       Reactions   Ciprofloxacin    Digoxin And Related    Erythromycin    Lopid [gemfibrozil]    Nitrofurantoin    Spironolactone Other (See Comments)   Hospitalized for dehydration   Cefuroxime Rash   Penicillins Rash   Has patient had a PCN reaction causing immediate rash, facial/tongue/throat swelling, SOB or lightheadedness with hypotension: Unknown Has patient had a PCN reaction causing severe rash involving mucus membranes or skin necrosis: No Has patient had a PCN reaction that required hospitalization: No Has patient had a PCN reaction occurring within the last 10 years: No If all of the above answers are "NO", then may proceed with Cephalosporin use.        Medication List     STOP taking these medications    carvedilol 12.5 MG tablet  Commonly known as: COREG   iron polysaccharides 150 MG capsule Commonly known as: NIFEREX   isosorbide mononitrate 60 MG 24 hr tablet Commonly known as: IMDUR   Klor-Con M10 10 MEQ tablet Generic drug: potassium chloride   sacubitril-valsartan 24-26 MG Commonly known as: ENTRESTO   torsemide 20 MG tablet Commonly known as: DEMADEX       TAKE these medications    acetaminophen 325 MG tablet Commonly known as: TYLENOL Take 2 tablets (650 mg total)  by mouth every 4 (four) hours as needed for headache or mild pain.   alendronate 70 MG tablet Commonly known as: FOSAMAX Take 70 mg by mouth once a week. Take with a full glass of water on an empty stomach.   apixaban 2.5 MG Tabs tablet Commonly known as: ELIQUIS Take 1 tablet (2.5 mg total) by mouth 2 (two) times daily. Start on 11/30/2021   aspirin 81 MG chewable tablet Chew 1 tablet (81 mg total) by mouth daily. Start taking on: November 30, 2021   atorvastatin 80 MG tablet Commonly known as: LIPITOR Take 1 tablet (80 mg total) by mouth daily. Start taking on: November 30, 2021   ergocalciferol 1.25 MG (50000 UT) capsule Commonly known as: VITAMIN D2 Take 50,000 Units by mouth once a week.   glucose blood test strip USE TO CHECK BLOOD SUGARS TWICE A DAY   Jardiance 10 MG Tabs tablet Generic drug: empagliflozin Take 10 mg by mouth daily.   levothyroxine 25 MCG tablet Commonly known as: SYNTHROID Take 25 mcg by mouth daily before breakfast.   pantoprazole 40 MG tablet Commonly known as: PROTONIX Take 40 mg by mouth daily.   senna-docusate 8.6-50 MG tablet Commonly known as: Senokot-S Take 1 tablet by mouth at bedtime as needed for mild constipation.   sertraline 50 MG tablet Commonly known as: ZOLOFT Take 50 mg by mouth daily.        Contact information for after-discharge care     Destination     [facility] .   Service: Skilled Nursing Contact information: [address] [phone]                    Discharge Exam: Danley Danker Weights   11/26/21 1342  Weight: 60.9 kg   General exam: Sleeping comfortably, responds to voice, no acute distress HEENT: Eyes mostly closed, opens them to voice briefly, moist mucus membranes, hearing grossly normal  Respiratory system: CTAB, no wheezes, rales or rhonchi, normal respiratory effort. Cardiovascular system: normal S1/S2, RRR, no peripheral edema.    Gastrointestinal system: soft, NT, ND, no HSM felt, +bowel sounds. Central nervous system: Facial droop noted, otherwise exam limited by somnolence.  Patient does follow commands Extremities: No clubbing or cyanosis, no edema, normal tone Skin: dry, intact, normal temperature Psychiatry: normal mood, congruent affect, judgement and insight appear normal   Condition at discharge: stable  The results of significant diagnostics from this hospitalization (including imaging, microbiology, ancillary and laboratory) are listed below for reference.   Imaging Studies: CT HEAD WO CONTRAST (5MM)  Result Date: 11/29/2021 CLINICAL DATA:  Stroke, follow-up; pre anticoagulation EXAM: CT HEAD WITHOUT CONTRAST TECHNIQUE: Contiguous axial images were obtained from the base of the skull through the vertex without intravenous contrast. RADIATION DOSE REDUCTION: This exam was performed according to the departmental dose-optimization program which includes automated exposure control, adjustment of the mA and/or kV according to patient size and/or use of iterative reconstruction technique. COMPARISON:  11/28/2021 FINDINGS: Brain: There is no acute intracranial hemorrhage, mass effect, or edema. No new loss of gray-white differentiation. There is no extra-axial fluid collection. Recent right cerebellar infarct is again noted. Prominence of the ventricles and sulci reflects stable parenchymal volume loss. Patchy and confluent hypoattenuation in the supratentorial white matter likely reflects stable chronic microvascular ischemic changes. Chronic small vessel infarct of the left corona radiata. Vascular: There is atherosclerotic calcification at the skull base. Skull: Calvarium is unremarkable. Sinuses/Orbits: No acute finding. Other: None. IMPRESSION: No acute intracranial hemorrhage. Recent right cerebellar infarct. Otherwise stable appearance. Electronically Signed   By: Macy Mis M.D.   On: 11/29/2021 14:46   CT  HEAD WO CONTRAST (5MM)  Result Date: 11/28/2021 CLINICAL DATA:  Stroke, follow-up, reduced consciousness and alertness EXAM: CT HEAD WITHOUT CONTRAST TECHNIQUE: Contiguous axial images were obtained from the base of the skull through the vertex without intravenous contrast. RADIATION DOSE REDUCTION: This exam was performed according to the departmental dose-optimization program which includes automated exposure control, adjustment of the mA and/or kV according to patient size and/or use of iterative reconstruction technique. COMPARISON:  11/26/2021 CT head and MRI head FINDINGS: Brain: Hypodensity in the right cerebellum, which correlates with the infarct seen on 11/26/2021. No new area of hypodensity to suggest acute infarction. No evidence of acute hemorrhage, cerebral edema, mass, mass effect, or midline shift. No hydrocephalus or extra-axial fluid collection. Periventricular white matter changes, likely the sequela of chronic small vessel ischemic disease. Cerebral and cerebellar atrophy. Left lentiform nucleus/external capsule lacunar infarcts. Vascular: No hyperdense vessel. Skull: Normal. Negative for fracture or focal lesion. Sinuses/Orbits: Mild mucosal thickening in the ethmoid air cells. Status post bilateral lens replacements. Other: The mastoid air cells are well aerated. IMPRESSION: No acute intracranial process. Hypodensity in the right cerebellum correlates with the infarct seen on 11/26/2021. Electronically Signed   By: Merilyn Baba M.D.   On: 11/28/2021 14:50   ECHOCARDIOGRAM COMPLETE  Result Date: 11/28/2021    ECHOCARDIOGRAM REPORT   Patient Name:   KHAILA VELARDE Date of Exam: 11/27/2021 Medical Rec #:  [id number]     Height:       57.0 in Accession #:    [id number]    Weight:       134.3 lb Date of Birth:  10-07-1932     BSA:          1.518 m Patient Age:    7 years      BP:           99/57 mmHg Patient Gender: F             HR:           72 bpm. Exam Location:  [facility] Procedure: 2D Echo,  Cardiac Doppler and Color Doppler Indications:     I48.91 Atrial Fibrillation  History:         Patient has prior history of Echocardiogram examinations, most                  recent 11/18/2021. CHF and Cardiomyopathy; Risk                  Factors:Hypertension, Dyslipidemia and Diabetes.                  Hypothyroidism.  Sonographer:     Cresenciano Lick RDCS Referring Phys:  [id number] DWAYNE D CALLWOOD Diagnosing Phys: Yolonda Kida MD IMPRESSIONS  1. Left ventricular ejection fraction, by estimation, is 40  to 45%. The left ventricle has mildly decreased function. The left ventricle demonstrates global hypokinesis. There is mild concentric left ventricular hypertrophy. Left ventricular diastolic parameters are consistent with Grade I diastolic dysfunction (impaired relaxation).  2. Right ventricular systolic function is low normal. The right ventricular size is mildly enlarged.  3. The mitral valve is grossly normal. Mild mitral valve regurgitation.  4. The aortic valve is grossly normal. Aortic valve regurgitation is mild. FINDINGS  Left Ventricle: Left ventricular ejection fraction, by estimation, is 40 to 45%. The left ventricle has mildly decreased function. The left ventricle demonstrates global hypokinesis. The left ventricular internal cavity size was normal in size. There is  mild concentric left ventricular hypertrophy. Left ventricular diastolic parameters are consistent with Grade I diastolic dysfunction (impaired relaxation). Right Ventricle: The right ventricular size is mildly enlarged. No increase in right ventricular wall thickness. Right ventricular systolic function is low normal. Left Atrium: Left atrial size was normal in size. Right Atrium: Right atrial size was normal in size. Pericardium: There is no evidence of pericardial effusion. Mitral Valve: The mitral valve is grossly normal. Mild mitral valve regurgitation. Tricuspid Valve: The tricuspid valve is grossly normal. Tricuspid valve  regurgitation is mild. Aortic Valve: The aortic valve is grossly normal. Aortic valve regurgitation is mild. Aortic regurgitation PHT measures 330 msec. Pulmonic Valve: The pulmonic valve was normal in structure. Pulmonic valve regurgitation is not visualized. Aorta: The ascending aorta was not well visualized. IAS/Shunts: No atrial level shunt detected by color flow Doppler.  LEFT VENTRICLE PLAX 2D LVIDd:         4.10 cm   Diastology LVIDs:         3.20 cm   LV e' lateral:   3.92 cm/s LV PW:         1.40 cm   LV E/e' lateral: 28.6 LV IVS:        1.00 cm LVOT diam:     2.00 cm LV SV:         41 LV SV Index:   27 LVOT Area:     3.14 cm  RIGHT VENTRICLE            IVC RV Basal diam:  4.50 cm    IVC diam: 1.70 cm RV S prime:     6.96 cm/s TAPSE (M-mode): 1.6 cm LEFT ATRIUM             Index        RIGHT ATRIUM           Index LA diam:        4.00 cm 2.63 cm/m   RA Area:     14.40 cm LA Vol (A2C):   50.3 ml 33.13 ml/m  RA Volume:   38.70 ml  25.49 ml/m LA Vol (A4C):   54.3 ml 35.76 ml/m LA Biplane Vol: 56.3 ml 37.08 ml/m  AORTIC VALVE LVOT Vmax:   65.45 cm/s LVOT Vmean:  47.850 cm/s LVOT VTI:    0.132 m AI PHT:      330 msec  AORTA Ao Root diam: 3.20 cm Ao Asc diam:  3.60 cm MV E velocity: 112.00 cm/s                             SHUNTS  Systemic VTI:  0.13 m                             Systemic Diam: 2.00 cm Yolonda Kida MD Electronically signed by Yolonda Kida MD Signature Date/Time: 11/28/2021/6:42:04 AM    Final    MR ANGIO HEAD WO CONTRAST  Result Date: 11/28/2021 CLINICAL DATA:  Follow-up examination for acute stroke. EXAM: MRA NECK WITHOUT CONTRAST MRA HEAD WITHOUT CONTRAST TECHNIQUE: Angiographic images of the Circle of Willis were acquired using MRA technique without intravenous contrast. COMPARISON:  Previous study from 11/26/2021. FINDINGS: MRA NECK FINDINGS AORTIC ARCH: Examination technically limited by lack of IV contrast and motion artifact. Aortic arch and  origin of the great vessels not assessed on this exam. RIGHT CAROTID SYSTEM: Visualized right common and internal carotid arteries patent with antegrade flow. No visible hemodynamically significant stenosis. No findings to suggest dissection. LEFT CAROTID SYSTEM: Visualized left common and internal carotid arteries patent with antegrade flow. No visible hemodynamically significant stenosis. No findings to suggest dissection. VERTEBRAL ARTERIES: Both vertebral arteries appear to arise from the subclavian arteries, although the proximal vertebral arteries are not well assessed on this noncontrast examination. Antegrade flow seen within both vertebral arteries within the neck. No visible significant stenosis or evidence for dissection. MRA HEAD FINDINGS ANTERIOR CIRCULATION: Examination mildly degraded by motion artifact. Both internal carotid arteries patent to the termini without stenosis or other abnormality. A1 segments patent. Normal anterior communicating artery complex. Anterior cerebral arteries patent without stenosis. No M1 stenosis or occlusion. No proximal MCA branch occlusion. Distal MCA branches perfused and symmetric. POSTERIOR CIRCULATION: Visualized distal V4 segments are somewhat diminutive but largely code dominant and patent to the vertebrobasilar junction without stenosis. Neither PICA origin well seen or assessed on this exam. Basilar diminutive but patent to its distal aspect without stenosis. Superior cerebellar arteries patent bilaterally. 3 mm aneurysm seen extending superiorly from the basilar tip (series 5, image 89). Fetal type origin of the PCAs bilaterally. Both PCAs patent to their distal aspects without appreciable stenosis. IMPRESSION: 1. Negative MRA of the head and neck for large vessel occlusion or other acute abnormality. 2. 3 mm basilar tip aneurysm. 3. Fetal type origin of the PCAs with overall diminutive vertebrobasilar system. Electronically Signed   By: Jeannine Boga  M.D.   On: 11/28/2021 02:08   MR ANGIO NECK WO CONTRAST  Result Date: 11/28/2021 CLINICAL DATA:  Follow-up examination for acute stroke. EXAM: MRA NECK WITHOUT CONTRAST MRA HEAD WITHOUT CONTRAST TECHNIQUE: Angiographic images of the Circle of Willis were acquired using MRA technique without intravenous contrast. COMPARISON:  Previous study from 11/26/2021. FINDINGS: MRA NECK FINDINGS AORTIC ARCH: Examination technically limited by lack of IV contrast and motion artifact. Aortic arch and origin of the great vessels not assessed on this exam. RIGHT CAROTID SYSTEM: Visualized right common and internal carotid arteries patent with antegrade flow. No visible hemodynamically significant stenosis. No findings to suggest dissection. LEFT CAROTID SYSTEM: Visualized left common and internal carotid arteries patent with antegrade flow. No visible hemodynamically significant stenosis. No findings to suggest dissection. VERTEBRAL ARTERIES: Both vertebral arteries appear to arise from the subclavian arteries, although the proximal vertebral arteries are not well assessed on this noncontrast examination. Antegrade flow seen within both vertebral arteries within the neck. No visible significant stenosis or evidence for dissection. MRA HEAD FINDINGS ANTERIOR CIRCULATION: Examination mildly degraded by motion artifact. Both internal carotid arteries patent to the termini  without stenosis or other abnormality. A1 segments patent. Normal anterior communicating artery complex. Anterior cerebral arteries patent without stenosis. No M1 stenosis or occlusion. No proximal MCA branch occlusion. Distal MCA branches perfused and symmetric. POSTERIOR CIRCULATION: Visualized distal V4 segments are somewhat diminutive but largely code dominant and patent to the vertebrobasilar junction without stenosis. Neither PICA origin well seen or assessed on this exam. Basilar diminutive but patent to its distal aspect without stenosis. Superior  cerebellar arteries patent bilaterally. 3 mm aneurysm seen extending superiorly from the basilar tip (series 5, image 89). Fetal type origin of the PCAs bilaterally. Both PCAs patent to their distal aspects without appreciable stenosis. IMPRESSION: 1. Negative MRA of the head and neck for large vessel occlusion or other acute abnormality. 2. 3 mm basilar tip aneurysm. 3. Fetal type origin of the PCAs with overall diminutive vertebrobasilar system. Electronically Signed   By: Jeannine Boga M.D.   On: 11/28/2021 02:08   MR BRAIN WO CONTRAST  Result Date: 11/26/2021 CLINICAL DATA:  Mental status change, unknown cause EXAM: MRI HEAD WITHOUT CONTRAST TECHNIQUE: Multiplanar, multiecho pulse sequences of the brain and surrounding structures were obtained without intravenous contrast. COMPARISON:  Same day CT head. FINDINGS: Brain: Acute infarct in the right cerebellum. Mild associated edema without mass effect. No evidence of acute hemorrhage, mass lesion, midline shift, or extra-axial fluid collection. Cerebral atrophy. Mild to moderate scattered T2/FLAIR hyperintensities in the white matter, nonspecific but compatible with chronic microvascular disease. Vascular: Major arterial flow voids are maintained at the skull base. Skull and upper cervical spine: Normal marrow signal. Sinuses/Orbits: Clear sinuses.  No acute orbital findings. Other: No sizable mastoid effusions IMPRESSION: Acute infarct in the right cerebellum. Mild associated edema without mass effect. Electronically Signed   By: Margaretha Sheffield M.D.   On: 11/26/2021 15:37   CT HEAD WO CONTRAST (5MM)  Result Date: 11/26/2021 CLINICAL DATA:  Altered mental status. EXAM: CT HEAD WITHOUT CONTRAST TECHNIQUE: Contiguous axial images were obtained from the base of the skull through the vertex without intravenous contrast. RADIATION DOSE REDUCTION: This exam was performed according to the departmental dose-optimization program which includes automated  exposure control, adjustment of the mA and/or kV according to patient size and/or use of iterative reconstruction technique. COMPARISON:  January 05, 2019. FINDINGS: Brain: Mild diffuse cortical atrophy is noted. Mild chronic ischemic white matter disease is noted. No mass effect or midline shift is noted. Ventricular size is within normal limits. There is no evidence of mass lesion, hemorrhage or acute infarction. Vascular: No hyperdense vessel or unexpected calcification. Skull: Normal. Negative for fracture or focal lesion. Sinuses/Orbits: No acute finding. Other: None. IMPRESSION: No acute intracranial abnormality seen. Electronically Signed   By: Marijo Conception M.D.   On: 11/26/2021 13:49   DG Chest Portable 1 View  Result Date: 11/26/2021 CLINICAL DATA:  Chest pain.  Weakness. EXAM: PORTABLE CHEST 1 VIEW COMPARISON:  11/24/2021 FINDINGS: The heart size and mediastinal contours are within normal limits. Aortic atherosclerotic calcification incidentally noted. Both lungs are clear. Chrondrous bone lesion again seen in the proximal left humeral metaphysis. IMPRESSION: No active disease. Electronically Signed   By: Marlaine Hind M.D.   On: 11/26/2021 12:36   CT Angio Chest Pulmonary Embolism (PE) W or WO Contrast  Result Date: 11/25/2021 CLINICAL DATA:  hemoptysis EXAM: CT ANGIOGRAPHY CHEST WITH CONTRAST TECHNIQUE: Multidetector CT imaging of the chest was performed using the standard protocol during bolus administration of intravenous contrast. Multiplanar CT image reconstructions and  MIPs were obtained to evaluate the vascular anatomy. RADIATION DOSE REDUCTION: This exam was performed according to the departmental dose-optimization program which includes automated exposure control, adjustment of the mA and/or kV according to patient size and/or use of iterative reconstruction technique. CONTRAST:  10m OMNIPAQUE IOHEXOL 350 MG/ML SOLN COMPARISON:  CT 08/19/2018 FINDINGS: Cardiovascular: There is contrast  mixing in the right and left branch pulmonary arteries and right lower lobe arteries somewhat limiting evaluation. Within this limitation there is no evidence of pulmonary embolism. Mild cardiomegaly. Enlarged main pulmonary artery and mildly dilated right ventricle suggesting pulmonary hypertension. No pericardial disease.Coronary artery atherosclerosis. Mediastinum/Nodes: No lymphadenopathy. The thyroid is unremarkable. There is either nodular wall thickening or ingested contents within the upper esophagus (series 5, images 60-73) Lungs/Pleura: Chronic complete right upper lobe atelectasis/scarring and mild bronchiectasis. Medial basilar right lower lobe nodule measures 6 mm, previously 6 mm, stable since 2018 and consider benign (series 6, image 69). There are multiple stable calcified granulomas. There is a 5 mm left upper lobe pulmonary nodule which is stable since March 2020 and also likely benign (series 6, image 26). There is a new ground-glass opacity or nodule which measures 9 mm in the right middle lobe (series 6, image 32). No pleural effusion. No pneumothorax. Upper Abdomen: Hepatic cirrhosis. Subcentimeter hypodense segment 7 lesion is unchanged. Musculoskeletal: Unchanged left proximal humerus probable enchondroma versus bone infarct. No acute osseous abnormality. Multilevel degenerative changes of the spine. There is a severe T6 compression deformity with prior vertebroplasty which is unchanged from prior exam. No new compression deformity. Review of the MIP images confirms the above findings. IMPRESSION: No evidence of pulmonary embolism. Note there is a degree of contrast mixing in the right and left branch pulmonary arteries and right lower lobe arteries which somewhat limits evaluation. Chronic complete right upper lobe atelectasis with associated bronchiectasis. Findings suggestive of pulmonary arterial hypertension. New ground-glass opacity/ground-glass nodule measuring 9 mm in the right middle  lobe, which could be infectious/inflammatory. Stable other pulmonary nodules. Initial follow-up with CT at 6 months is recommended to confirm persistence. If persistent, repeat CT is recommended every 2 years until 5 years of stability has been established. This recommendation follows the consensus statement: Guidelines for Management of Incidental Pulmonary Nodules Detected on CT Images: From the Fleischner Society 2017; Radiology 2017; 284:228-243. Coronary artery atherosclerosis. Hepatic cirrhosis. Electronically Signed   By: JMaurine SimmeringM.D.   On: 11/25/2021 12:52   DG Chest Port 1 View  Result Date: 11/24/2021 CLINICAL DATA:  Respiratory distress and hemoptysis. EXAM: PORTABLE CHEST 1 VIEW COMPARISON:  November 18, 2021 FINDINGS: The heart size and mediastinal contours are within normal limits. There is marked severity calcification of the thoracic aorta. Mild, diffuse, chronic appearing increased interstitial lung markings are seen. There is no evidence of acute infiltrate, pleural effusion or pneumothorax. A stable enchondroma is suspected within the proximal left humerus. IMPRESSION: Chronic appearing increased interstitial lung markings without evidence of acute or active cardiopulmonary disease. Electronically Signed   By: TVirgina NorfolkM.D.   On: 11/24/2021 16:34   ECHOCARDIOGRAM COMPLETE  Result Date: 11/19/2021    ECHOCARDIOGRAM REPORT   Patient Name:   YSABREEN KITCHENDate of Exam: 11/18/2021 Medical Rec #:  [id number]    Height:       57.0 in Accession #:    [id number]   Weight:       134.0 lb Date of Birth:  [birth year]    BSA:  1.517 m Patient Age:    62 years      BP:           183/100 mmHg Patient Gender: F             HR:           78 bpm. Exam Location:  [facility] Procedure: 2D Echo, Color Doppler and Cardiac Doppler Indications:     I50.9 Congestive heart failure  History:         Patient has prior history of Echocardiogram examinations. CHF                  and Cardiomyopathy; Risk  Factors:Former Smoker, Diabetes,                  Hypertension and Dyslipidemia.  Sonographer:     Rosalia Hammers Referring Phys:  [id number] ASIA B [place] Diagnosing Phys: Serafina Royals MD  Sonographer Comments: Technically difficult study due to poor echo windows. Image acquisition challenging due to respiratory motion. IMPRESSIONS  1. Left ventricular ejection fraction, by estimation, is 20 to 25%. The left ventricle has severely decreased function. The left ventricle demonstrates global hypokinesis. The left ventricular internal cavity size was mildly dilated. There is severe left ventricular hypertrophy. Left ventricular diastolic parameters are consistent with Grade II diastolic dysfunction (pseudonormalization).  2. Right ventricular systolic function is normal. The right ventricular size is normal.  3. Left atrial size was mildly dilated.  4. The mitral valve is normal in structure. Mild mitral valve regurgitation.  5. The aortic valve is normal in structure. Aortic valve regurgitation is trivial. FINDINGS  Left Ventricle: Left ventricular ejection fraction, by estimation, is 20 to 25%. The left ventricle has severely decreased function. The left ventricle demonstrates global hypokinesis. The left ventricular internal cavity size was mildly dilated. There is severe left ventricular hypertrophy. Left ventricular diastolic parameters are consistent with Grade II diastolic dysfunction (pseudonormalization). Right Ventricle: The right ventricular size is normal. No increase in right ventricular wall thickness. Right ventricular systolic function is normal. Left Atrium: Left atrial size was mildly dilated. Right Atrium: Right atrial size was normal in size. Pericardium: There is no evidence of pericardial effusion. Mitral Valve: The mitral valve is normal in structure. Mild mitral valve regurgitation. Tricuspid Valve: The tricuspid valve is normal in structure. Tricuspid valve regurgitation is mild. Aortic  Valve: The aortic valve is normal in structure. Aortic valve regurgitation is trivial. Aortic valve mean gradient measures 3.0 mmHg. Aortic valve peak gradient measures 5.7 mmHg. Aortic valve area, by VTI measures 1.61 cm. Pulmonic Valve: The pulmonic valve was normal in structure. Pulmonic valve regurgitation is mild. Aorta: The aortic root and ascending aorta are structurally normal, with no evidence of dilitation. IAS/Shunts: No atrial level shunt detected by color flow Doppler.  LEFT VENTRICLE PLAX 2D LVIDd:         4.54 cm   Diastology LVIDs:         3.25 cm   LV e' medial:  4.03 cm/s LV PW:         1.42 cm   LV e' lateral: 6.74 cm/s LV IVS:        1.07 cm LVOT diam:     1.70 cm LV SV:         32 LV SV Index:   21 LVOT Area:     2.27 cm  RIGHT VENTRICLE RV Basal diam:  3.77 cm RV S prime:     7.80 cm/s  LEFT ATRIUM             Index        RIGHT ATRIUM           Index LA diam:        3.50 cm 2.31 cm/m   RA Area:     14.40 cm LA Vol (A2C):   46.8 ml 30.85 ml/m  RA Volume:   36.50 ml  24.06 ml/m LA Vol (A4C):   40.0 ml 26.37 ml/m LA Biplane Vol: 44.2 ml 29.13 ml/m  AORTIC VALVE                    PULMONIC VALVE AV Area (Vmax):    1.23 cm     PV Vmax:          0.75 m/s AV Area (Vmean):   1.24 cm     PV Vmean:         58.700 cm/s AV Area (VTI):     1.61 cm     PV VTI:           0.154 m AV Vmax:           119.00 cm/s  PV Peak grad:     2.2 mmHg AV Vmean:          77.000 cm/s  PV Mean grad:     1.0 mmHg AV VTI:            0.197 m      PR End Diast Vel: 12.53 msec AV Peak Grad:      5.7 mmHg AV Mean Grad:      3.0 mmHg LVOT Vmax:         64.30 cm/s LVOT Vmean:        42.200 cm/s LVOT VTI:          0.140 m LVOT/AV VTI ratio: 0.71  AORTA Ao Root diam: 3.30 cm  SHUNTS Systemic VTI:  0.14 m Systemic Diam: 1.70 cm Serafina Royals MD Electronically signed by Serafina Royals MD Signature Date/Time: 11/19/2021/1:08:01 PM    Final    DG Chest Portable 1 View  Result Date: 11/18/2021 CLINICAL DATA:  Shortness of breath  and respiratory distress. EXAM: PORTABLE CHEST 1 VIEW COMPARISON:  None Available. FINDINGS: The heart size and mediastinal contours are within normal limits. There is marked severity calcification and tortuosity of the thoracic aorta. Mild, diffuse chronic appearing increased interstitial lung markings are seen. There is no evidence of focal consolidation, pleural effusion or pneumothorax. Prior vertebroplasty is noted within the midthoracic spine. A stable enchondroma is suspected within the proximal left humerus. IMPRESSION: Chronic appearing increased interstitial lung markings without focal consolidation. A mild superimposed component of interstitial edema cannot be excluded. Electronically Signed   By: Virgina Norfolk M.D.   On: 11/18/2021 01:46    Microbiology: Results for orders placed or performed during the hospital encounter of 11/26/21  SARS Coronavirus 2 by RT PCR (hospital order, performed in [facility] hospital lab) *cepheid single result test* Anterior Nasal Swab     Status: None   Collection Time: 11/26/21  1:35 PM   Specimen: Anterior Nasal Swab  Result Value Ref Range Status   SARS Coronavirus 2 by RT PCR NEGATIVE NEGATIVE Final    Comment: (NOTE) SARS-CoV-2 target nucleic acids are NOT DETECTED.  The SARS-CoV-2 RNA is generally detectable in upper and lower respiratory specimens during the acute phase of infection. The lowest concentration of SARS-CoV-2 viral copies this  assay can detect is 250 copies / mL. A negative result does not preclude SARS-CoV-2 infection and should not be used as the sole basis for treatment or other patient management decisions.  A negative result may occur with improper specimen collection / handling, submission of specimen other than nasopharyngeal swab, presence of viral mutation(s) within the areas targeted by this assay, and inadequate number of viral copies (<250 copies / mL). A negative result must be combined with clinical observations,  patient history, and epidemiological information.  Fact Sheet for Patients:   https://www.patel.info/  Fact Sheet for Healthcare Providers: https://hall.com/  This test is not yet approved or  cleared by the [place] FDA and has been authorized for detection and/or diagnosis of SARS-CoV-2 by FDA under an Emergency Use Authorization (EUA).  This EUA will remain in effect (meaning this test can be used) for the duration of the COVID-19 declaration under Section 564(b)(1) of the Act, 21 U.S.C. section 360bbb-3(b)(1), unless the authorization is terminated or revoked sooner.  Performed at [facility], [address] [place], [place] [address]   Urine Culture     Status: Abnormal   Collection Time: 11/26/21  1:35 PM   Specimen: Urine, Clean Catch  Result Value Ref Range Status   Specimen Description   Final    URINE, CLEAN CATCH Performed at [facility], [address], [place], [place] [address]    Special Requests   Final    NONE Performed at [facility], [address] [place], [place] [address]    Culture >=100,000 COLONIES/mL CITROBACTER BRAAKII (A)  Final   Report Status 11/28/2021 FINAL  Final   Organism ID, Bacteria CITROBACTER BRAAKII (A)  Final      Susceptibility   Citrobacter braakii - MIC*    CEFAZOLIN >=64 RESISTANT Resistant     CEFEPIME <=0.12 SENSITIVE Sensitive     CEFTRIAXONE <=0.25 SENSITIVE Sensitive     CIPROFLOXACIN <=0.25 SENSITIVE Sensitive     GENTAMICIN <=1 SENSITIVE Sensitive     IMIPENEM 1 SENSITIVE Sensitive     NITROFURANTOIN <=16 SENSITIVE Sensitive     TRIMETH/SULFA <=20 SENSITIVE Sensitive     PIP/TAZO <=4 SENSITIVE Sensitive     * >=100,000 COLONIES/mL CITROBACTER BRAAKII  Blood culture (routine x 2)     Status: None (Preliminary result)   Collection Time: 11/26/21  2:36 PM   Specimen: BLOOD  Result Value Ref Range Status   Specimen Description BLOOD BLOOD  LEFT HAND  Final   Special Requests   Final    BOTTLES DRAWN AEROBIC AND ANAEROBIC Blood Culture results may not be optimal due to an inadequate volume of blood received in culture bottles   Culture   Final    NO GROWTH 3 DAYS Performed at [facility], [address] [place], [place] [address]    Report Status PENDING  Incomplete  Blood culture (routine x 2)     Status: None (Preliminary result)   Collection Time: 11/26/21  6:38 PM   Specimen: BLOOD  Result Value Ref Range Status   Specimen Description BLOOD RIGHT FOREARM  Final   Special Requests   Final    BOTTLES DRAWN AEROBIC AND ANAEROBIC Blood Culture results may not be optimal due to an inadequate volume of blood received in culture bottles   Culture   Final    NO GROWTH 3 DAYS Performed at [facility], [address] [place], [place] [address]    Report Status PENDING  Incomplete    Labs: CBC: Recent Labs  Lab 11/23/21 0629 11/25/21 0447 11/26/21 1223 11/27/21 0613 11/28/21 0612  WBC 5.1 8.5 8.2 7.2 6.1  NEUTROABS  --   --  5.5  --   --   HGB 17.3* 16.8* 16.3* 15.8* 15.1*  HCT 54.9* 52.5* 51.5* 49.6* 47.5*  MCV 92.3 91.5 91.0 92.9 91.9  PLT 97* 103* 142* 119* 767   Basic Metabolic Panel: Recent Labs  Lab 11/26/21 1223 11/26/21 1436 11/26/21 1659 11/27/21 0613 11/27/21 0655 11/28/21 0612 11/29/21 0653  NA 134* 135 137 137  --  139 139  K 6.1* 5.5* 4.7 4.6  --  3.9 4.2  CL 93* 96* 99 100  --  104 106  CO2 '30 31 30 25  '$ --  27 28  GLUCOSE 156* 150* 192* 117*  --  105* 120*  BUN 71* 74* 71* 66*  --  51* 37*  CREATININE 1.83* 1.78* 1.66* 1.34*  --  0.95 0.89  CALCIUM 8.8* 9.0 8.4* 8.8*  --  8.7* 8.4*  MG 2.7*  --   --   --  2.6*  --   --    Liver Function Tests: Recent Labs  Lab 11/26/21 1223  AST 34  ALT 14  ALKPHOS 51  BILITOT 1.3*  PROT 7.0  ALBUMIN 3.5   CBG: Recent Labs  Lab 11/28/21 1609 11/28/21 2329 11/29/21 0845 11/29/21 0853 11/29/21 1222  GLUCAP 147*  145* 134* 137* 166*    Discharge time spent: greater than 30 minutes.  Signed: Ezekiel Slocumb, DO [facility] 11/29/2021

## 2021-11-29 NOTE — Assessment & Plan Note (Signed)
Hemoglobin A1c 6.5%. Covered with sliding scale NovoLog. Resume Jardiance at discharge.

## 2021-11-29 NOTE — TOC Progression Note (Signed)
Transition of Care [facility]) - Progression Note    Patient Details  Name: Jacqueline [name] MRN: [id number] Date of Birth: 1932/09/25  Transition of [facility]) CM/SW Contact  Laurena Slimmer, RN Phone Number: 11/29/2021, 4:06 PM  Clinical Narrative:    Spoke with patient's daughter to advise of discharge. Patient daughter requesting to speak with MD. MD notified. FL2, and discharge summary sent in the [facility]. EMS packet and transportation arranged. TOC signing off.        Expected Discharge Plan and Services           Expected Discharge Date: 11/29/21                                     Social Determinants of Health (SDOH) Interventions    Readmission Risk Interventions     No data to display

## 2021-11-29 NOTE — NC FL2 (Signed)
[place] LEVEL OF CARE SCREENING TOOL     IDENTIFICATION  Patient Name: Jacqueline [name] Birthdate: Jan 30, 1933 Sex: female Admission Date (Current Location): 11/26/2021  [facility] and [place] Number:  Engineering geologist and Address:  [facility], [address] [place], [place] [address]      Provider Number:    Attending Physician Name and Address:  Ezekiel Slocumb, DO  Relative Name and Phone Number:  [name] [phone]    Current Level of Care: Hospital Recommended Level of Care: [place] Prior Approval Number:    Date Approved/Denied:   PASRR Number: [id number]  Discharge Plan: SNF    Current Diagnoses: Patient Active Problem List   Diagnosis Date Noted   Acute ischemic stroke ([place]) 11/28/2021   New onset a-fib ([place]) 11/27/2021   NSTEMI (non-ST elevated myocardial infarction) ([place]) 11/26/2021   Depression 11/26/2021   UTI (urinary tract infection) 11/26/2021   Slurred speech 11/26/2021   Hemoptysis    Weakness 11/22/2021   CKD stage 3 due to type 2 diabetes mellitus ([place]) 11/21/2021   Pain in a tooth or teeth 11/20/2021   Chronic respiratory failure with hypoxia (HCC)    Elevated troponin 11/18/2021   Acute exacerbation of CHF (congestive heart failure) ([place]) 11/18/2021   Thrombocytopenia ([place]) 02/11/2019   Chest pain 01/05/2019   Hyponatremia 06/09/2017   MI, acute, non ST segment elevation ([place]) 06/01/2017   Acute upper back pain 03/05/2017   Carcinoma of unknown primary ([place]) 03/05/2017   Liver lesion 25/85/2778   Chronic systolic CHF (congestive heart failure) ([place]) 02/24/2017   HTN (hypertension) 02/24/2017   GERD (gastroesophageal reflux disease) 02/24/2017   Hypothyroidism 02/24/2017   Hypoglycemia 02/24/2017   Dizziness 02/18/2017    Orientation RESPIRATION BLADDER Height & Weight     Self, Time, Situation, Place  O2 (024L) Indwelling catheter Weight: 60.9 kg Height:  '4\' 9"'$   (144.8 cm)  BEHAVIORAL SYMPTOMS/MOOD NEUROLOGICAL BOWEL NUTRITION STATUS      Continent Diet  AMBULATORY STATUS COMMUNICATION OF NEEDS Skin   Limited Assist Verbally Normal                       Personal Care Assistance Level of Assistance  Bathing Bathing Assistance: Limited assistance Feeding assistance: Limited assistance Dressing Assistance: Limited assistance Total Care Assistance: Limited assistance   Functional Limitations Info    Sight Info: Adequate Hearing Info: Adequate Speech Info: Adequate    SPECIAL CARE FACTORS FREQUENCY  PT (By licensed PT)     PT Frequency: Min 2 x weekly              Contractures Contractures Info: Not present    Additional Factors Info  Code Status, Allergies Code Status Info: DNR Allergies Info: Ciprofloxacin, Digoxin And Related, Erythromycin, Lopid (Gemfibrozil), Nitrofurantoin, Spironolactone, Cefuroxime, Penicillins           Current Medications (11/29/2021):  This is the current hospital active medication list Current Facility-Administered Medications  Medication Dose Route Frequency Provider Last Rate Last Admin    stroke: early stages of recovery book   Does not apply Once Agbata, Tochukwu, MD       acetaminophen (TYLENOL) tablet 650 mg  650 mg Oral Q4H PRN Agbata, Tochukwu, MD       aspirin chewable tablet 81 mg  81 mg Oral Daily Agbata, Tochukwu, MD   81 mg at 11/29/21 0901   atorvastatin (LIPITOR) tablet 80 mg  80 mg Oral  Daily Agbata, Tochukwu, MD   80 mg at 11/29/21 0901   insulin aspart (novoLOG) injection 0-15 Units  0-15 Units Subcutaneous TID WC Agbata, Tochukwu, MD   3 Units at 11/29/21 1240   levothyroxine (SYNTHROID) tablet 25 mcg  25 mcg Oral QAC breakfast Agbata, Tochukwu, MD   25 mcg at 11/29/21 0532   nitroGLYCERIN (NITROSTAT) SL tablet 0.4 mg  0.4 mg Sublingual Q5 Min x 3 PRN Agbata, Tochukwu, MD       ondansetron (ZOFRAN) injection 4 mg  4 mg Intravenous Q6H PRN Agbata, Tochukwu, MD        pantoprazole (PROTONIX) EC tablet 40 mg  40 mg Oral Daily Agbata, Tochukwu, MD   40 mg at 11/29/21 0901   senna-docusate (Senokot-S) tablet 1 tablet  1 tablet Oral BID Agbata, Tochukwu, MD   1 tablet at 11/29/21 0901   sertraline (ZOLOFT) tablet 50 mg  50 mg Oral Daily Agbata, Tochukwu, MD   50 mg at 11/29/21 0901     Discharge Medications: Please see discharge summary for a list of discharge medications.  Relevant Imaging Results:  Relevant Lab Results:   Additional Information Patient's ss# [id number]  Laurena Slimmer, RN

## 2021-11-29 NOTE — Progress Notes (Signed)
Neurology Progress Note  Major interval events/Subjective: -Family reports he is back to baseline today -No new complaints -No further episodes of transient neurological issues or diaphoresis  Exam: Current vital signs: BP 131/61 (BP Location: Right Arm)   Pulse 75   Temp 98.3 F (36.8 C) (Oral)   Resp 17   Ht '4\' 9"'$  (1.448 m)   Wt 60.9 kg   SpO2 100%   BMI 29.05 kg/m  Vital signs in last 24 hours: Temp:  [97.7 F (36.5 C)-98.5 F (36.9 C)] 98.3 F (36.8 C) (06/16 1128) Pulse Rate:  [66-81] 75 (06/16 1128) Resp:  [17-23] 17 (06/16 1128) BP: (129-150)/(55-78) 131/61 (06/16 1128) SpO2:  [97 %-100 %] 100 % (06/16 1128)   Gen: In bed, alert and watching TV with interest Resp: Comfortable at rest Cardiac: Perfusing extremities well  Abd: soft, nt  Neuro: MS: Awake, alert, conversant, oriented to month, year and situation, mild language barrier given English is her second language CN:  EOMI, face symmetric, tongue midline,  Motor/coordination: Using both hands to feed herself with excellent coordination  Pertinent Labs:  Basic Metabolic Panel: Recent Labs  Lab 11/26/21 1223 11/26/21 1436 11/26/21 1659 11/27/21 0613 11/27/21 0655 11/28/21 0612 11/29/21 0653  NA 134* 135 137 137  --  139 139  K 6.1* 5.5* 4.7 4.6  --  3.9 4.2  CL 93* 96* 99 100  --  104 106  CO2 '30 31 30 25  '$ --  27 28  GLUCOSE 156* 150* 192* 117*  --  105* 120*  BUN 71* 74* 71* 66*  --  51* 37*  CREATININE 1.83* 1.78* 1.66* 1.34*  --  0.95 0.89  CALCIUM 8.8* 9.0 8.4* 8.8*  --  8.7* 8.4*  MG 2.7*  --   --   --  2.6*  --   --      CBC: Recent Labs  Lab 11/23/21 0629 11/25/21 0447 11/26/21 1223 11/27/21 0613 11/28/21 0612  WBC 5.1 8.5 8.2 7.2 6.1  NEUTROABS  --   --  5.5  --   --   HGB 17.3* 16.8* 16.3* 15.8* 15.1*  HCT 54.9* 52.5* 51.5* 49.6* 47.5*  MCV 92.3 91.5 91.0 92.9 91.9  PLT 97* 103* 142* 119* 167     Coagulation Studies: Recent Labs    11/26/21 1436  LABPROT 12.5  INR  0.9     Lab Results  Component Value Date   CHOL 262 (H) 11/27/2021   HDL 34 (L) 11/27/2021   LDLCALC 165 (H) 11/27/2021   TRIG 315 (H) 11/27/2021   CHOLHDL 7.7 11/27/2021   ECHO:  1. Left ventricular ejection fraction, by estimation, is 40 to 45%. The  left ventricle has mildly decreased function. The left ventricle  demonstrates global hypokinesis. There is mild concentric left ventricular  hypertrophy. Left ventricular diastolic  parameters are consistent with Grade I diastolic dysfunction (impaired  relaxation).   2. Right ventricular systolic function is low normal. The right  ventricular size is mildly enlarged.   3. The mitral valve is grossly normal. Mild mitral valve regurgitation.   4. The aortic valve is grossly normal. Aortic valve regurgitation is  mild.   MRA head and neck: 1. Negative MRA of the head and neck for large vessel occlusion or other acute abnormality. 2. 3 mm basilar tip aneurysm. 3. Fetal type origin of the PCAs with overall diminutive vertebrobasilar system.  Impression: 87 year old with stroke risk factors of diabetes, hypertension, hyperlipidemia, congestive heart failure, new  onset A-fib, as well as history significant for chronic mild thrombocytopenia (platelets 110s to 160s). I favor the event today to represent vasovagal event secondary to pain or hypotension.  Her examination at this time seems stable other than some mild weakness of both upper extremities.  However given her risk factors, reasonable to repeat head CT  Recommendations:  # Cerebellar stroke in the setting of new onset Afib Agree with Dr. Livia Snellen earlier recommendations: - Continue aspirin '81mg'$  daily - Goal normotension - LDL 165, continue atorvastatin '80mg'$  daily - Repeat head CT today to confirm stable scan.  If no acute findings,           1) plan to start anticoagulation as previously recommended tomorrow morning          2) no further inpatient neurological work-up  indicated and neurology will be available on an as-needed basis  # 3 mm basilar tip aneursym - Outpatient neurosurgery follow-up  Lesleigh Noe MD-PhD [facility] [phone]  [facility] coverage for [facility] is from 8 AM to 4 AM in-house and 4 PM to 8 PM by telephone/video. 8 PM to 8 AM emergent questions or overnight urgent questions should be addressed to Teleneurology On-call or [facility] neurohospitalist; contact information can be found on AMION

## 2021-11-29 NOTE — Progress Notes (Signed)
[facility]    SUBJECTIVE: Patient seems to be somewhat daughter at bedside no worsening symptoms more alert no chest pain no shortness of breath no leg edema no evidence of bleeding on Eliquis   Vitals:   11/28/21 2013 11/28/21 2330 11/29/21 0503 11/29/21 0732  BP: 138/61 (!) 139/55 (!) 150/63 132/78  Pulse: 81 76 72 66  Resp: '20 19 18 18  '$ Temp: 97.7 F (36.5 C) 97.9 F (36.6 C) 97.7 F (36.5 C)   TempSrc: Oral  Oral   SpO2: 100% 97% 98% 99%  Weight:      Height:         Intake/Output Summary (Last 24 hours) at 11/29/2021 1129 Last data filed at 11/29/2021 0500 Gross per 24 hour  Intake 480.38 ml  Output 1950 ml  Net -1469.62 ml      PHYSICAL EXAM  General: Well developed, well nourished, in no acute distress HEENT:  Normocephalic and atramatic Neck:  No JVD.  Lungs: Clear bilaterally to auscultation and percussion. Heart: HRRR . Normal S1 and S2 without gallops or murmurs.  Abdomen: Bowel sounds are positive, abdomen soft and non-tender  Msk:  Back normal, normal gait. Normal strength and tone for age. Extremities: No clubbing, cyanosis or edema.   Neuro: Alert and oriented X 3. Psych:  Good affect, responds appropriately   LABS: Basic Metabolic Panel: Recent Labs    11/26/21 1223 11/26/21 1436 11/27/21 0655 11/28/21 0612 11/29/21 0653  NA 134*   < >  --  139 139  K 6.1*   < >  --  3.9 4.2  CL 93*   < >  --  104 106  CO2 30   < >  --  27 28  GLUCOSE 156*   < >  --  105* 120*  BUN 71*   < >  --  51* 37*  CREATININE 1.83*   < >  --  0.95 0.89  CALCIUM 8.8*   < >  --  8.7* 8.4*  MG 2.7*  --  2.6*  --   --    < > = values in this interval not displayed.   Liver Function Tests: Recent Labs    11/26/21 1223  AST 34  ALT 14  ALKPHOS 51  BILITOT 1.3*  PROT 7.0  ALBUMIN 3.5   No results for input(s): "LIPASE", "AMYLASE" in the last 72 hours. CBC: Recent Labs    11/26/21 1223 11/27/21 0613 11/28/21 0612  WBC 8.2 7.2 6.1  NEUTROABS 5.5  --    --   HGB 16.3* 15.8* 15.1*  HCT 51.5* 49.6* 47.5*  MCV 91.0 92.9 91.9  PLT 142* 119* 167   Cardiac Enzymes: No results for input(s): "CKTOTAL", "CKMB", "CKMBINDEX", "TROPONINI" in the last 72 hours. BNP: Invalid input(s): "POCBNP" D-Dimer: No results for input(s): "DDIMER" in the last 72 hours. Hemoglobin A1C: No results for input(s): "HGBA1C" in the last 72 hours. Fasting Lipid Panel: Recent Labs    11/27/21 0613  CHOL 262*  HDL 34*  LDLCALC 165*  TRIG 315*  CHOLHDL 7.7   Thyroid Function Tests: No results for input(s): "TSH", "T4TOTAL", "T3FREE", "THYROIDAB" in the last 72 hours.  Invalid input(s): "FREET3" Anemia Panel: No results for input(s): "VITAMINB12", "FOLATE", "FERRITIN", "TIBC", "IRON", "RETICCTPCT" in the last 72 hours.  CT HEAD WO CONTRAST (5MM)  Result Date: 11/28/2021 CLINICAL DATA:  Stroke, follow-up, reduced consciousness and alertness EXAM: CT HEAD WITHOUT CONTRAST TECHNIQUE: Contiguous axial images were obtained from the  base of the skull through the vertex without intravenous contrast. RADIATION DOSE REDUCTION: This exam was performed according to the departmental dose-optimization program which includes automated exposure control, adjustment of the mA and/or kV according to patient size and/or use of iterative reconstruction technique. COMPARISON:  11/26/2021 CT head and MRI head FINDINGS: Brain: Hypodensity in the right cerebellum, which correlates with the infarct seen on 11/26/2021. No new area of hypodensity to suggest acute infarction. No evidence of acute hemorrhage, cerebral edema, mass, mass effect, or midline shift. No hydrocephalus or extra-axial fluid collection. Periventricular white matter changes, likely the sequela of chronic small vessel ischemic disease. Cerebral and cerebellar atrophy. Left lentiform nucleus/external capsule lacunar infarcts. Vascular: No hyperdense vessel. Skull: Normal. Negative for fracture or focal lesion. Sinuses/Orbits:  Mild mucosal thickening in the ethmoid air cells. Status post bilateral lens replacements. Other: The mastoid air cells are well aerated. IMPRESSION: No acute intracranial process. Hypodensity in the right cerebellum correlates with the infarct seen on 11/26/2021. Electronically Signed   By: Merilyn Baba M.D.   On: 11/28/2021 14:50   ECHOCARDIOGRAM COMPLETE  Result Date: 11/28/2021    ECHOCARDIOGRAM REPORT   Patient Name:   Jacqueline [name] Date of Exam: 11/27/2021 Medical Rec #:  [id number]     Height:       57.0 in Accession #:    [id number]    Weight:       134.3 lb Date of Birth:  [birth year]     BSA:          1.518 m Patient Age:    88 years      BP:           99/57 mmHg Patient Gender: F             HR:           72 bpm. Exam Location:  [facility] Procedure: 2D Echo, Cardiac Doppler and Color Doppler Indications:     I48.91 Atrial Fibrillation  History:         Patient has prior history of Echocardiogram examinations, most                  recent 11/18/2021. CHF and Cardiomyopathy; Risk                  Factors:Hypertension, Dyslipidemia and Diabetes.                  Hypothyroidism.  Sonographer:     Cresenciano Lick RDCS Referring Phys:  [id number] Kiaja Shorty D Charda Janis Diagnosing Phys: Yolonda Kida MD IMPRESSIONS  1. Left ventricular ejection fraction, by estimation, is 40 to 45%. The left ventricle has mildly decreased function. The left ventricle demonstrates global hypokinesis. There is mild concentric left ventricular hypertrophy. Left ventricular diastolic parameters are consistent with Grade I diastolic dysfunction (impaired relaxation).  2. Right ventricular systolic function is low normal. The right ventricular size is mildly enlarged.  3. The mitral valve is grossly normal. Mild mitral valve regurgitation.  4. The aortic valve is grossly normal. Aortic valve regurgitation is mild. FINDINGS  Left Ventricle: Left ventricular ejection fraction, by estimation, is 40 to 45%. The left ventricle has mildly  decreased function. The left ventricle demonstrates global hypokinesis. The left ventricular internal cavity size was normal in size. There is  mild concentric left ventricular hypertrophy. Left ventricular diastolic parameters are consistent with Grade I diastolic dysfunction (impaired relaxation). Right Ventricle: The right ventricular size is mildly enlarged. No increase in  right ventricular wall thickness. Right ventricular systolic function is low normal. Left Atrium: Left atrial size was normal in size. Right Atrium: Right atrial size was normal in size. Pericardium: There is no evidence of pericardial effusion. Mitral Valve: The mitral valve is grossly normal. Mild mitral valve regurgitation. Tricuspid Valve: The tricuspid valve is grossly normal. Tricuspid valve regurgitation is mild. Aortic Valve: The aortic valve is grossly normal. Aortic valve regurgitation is mild. Aortic regurgitation PHT measures 330 msec. Pulmonic Valve: The pulmonic valve was normal in structure. Pulmonic valve regurgitation is not visualized. Aorta: The ascending aorta was not well visualized. IAS/Shunts: No atrial level shunt detected by color flow Doppler.  LEFT VENTRICLE PLAX 2D LVIDd:         4.10 cm   Diastology LVIDs:         3.20 cm   LV e' lateral:   3.92 cm/s LV PW:         1.40 cm   LV E/e' lateral: 28.6 LV IVS:        1.00 cm LVOT diam:     2.00 cm LV SV:         41 LV SV Index:   27 LVOT Area:     3.14 cm  RIGHT VENTRICLE            IVC RV Basal diam:  4.50 cm    IVC diam: 1.70 cm RV S prime:     6.96 cm/s TAPSE (M-mode): 1.6 cm LEFT ATRIUM             Index        RIGHT ATRIUM           Index LA diam:        4.00 cm 2.63 cm/m   RA Area:     14.40 cm LA Vol (A2C):   50.3 ml 33.13 ml/m  RA Volume:   38.70 ml  25.49 ml/m LA Vol (A4C):   54.3 ml 35.76 ml/m LA Biplane Vol: 56.3 ml 37.08 ml/m  AORTIC VALVE LVOT Vmax:   65.45 cm/s LVOT Vmean:  47.850 cm/s LVOT VTI:    0.132 m AI PHT:      330 msec  AORTA Ao Root diam:  3.20 cm Ao Asc diam:  3.60 cm MV E velocity: 112.00 cm/s                             SHUNTS                             Systemic VTI:  0.13 m                             Systemic Diam: 2.00 cm Yolonda Kida MD Electronically signed by Yolonda Kida MD Signature Date/Time: 11/28/2021/6:42:04 AM    Final    MR ANGIO HEAD WO CONTRAST  Result Date: 11/28/2021 CLINICAL DATA:  Follow-up examination for acute stroke. EXAM: MRA NECK WITHOUT CONTRAST MRA HEAD WITHOUT CONTRAST TECHNIQUE: Angiographic images of the Circle of Willis were acquired using MRA technique without intravenous contrast. COMPARISON:  Previous study from 11/26/2021. FINDINGS: MRA NECK FINDINGS AORTIC ARCH: Examination technically limited by lack of IV contrast and motion artifact. Aortic arch and origin of the great vessels not assessed on this exam. RIGHT CAROTID SYSTEM: Visualized right common and internal  carotid arteries patent with antegrade flow. No visible hemodynamically significant stenosis. No findings to suggest dissection. LEFT CAROTID SYSTEM: Visualized left common and internal carotid arteries patent with antegrade flow. No visible hemodynamically significant stenosis. No findings to suggest dissection. VERTEBRAL ARTERIES: Both vertebral arteries appear to arise from the subclavian arteries, although the proximal vertebral arteries are not well assessed on this noncontrast examination. Antegrade flow seen within both vertebral arteries within the neck. No visible significant stenosis or evidence for dissection. MRA HEAD FINDINGS ANTERIOR CIRCULATION: Examination mildly degraded by motion artifact. Both internal carotid arteries patent to the termini without stenosis or other abnormality. A1 segments patent. Normal anterior communicating artery complex. Anterior cerebral arteries patent without stenosis. No M1 stenosis or occlusion. No proximal MCA branch occlusion. Distal MCA branches perfused and symmetric. POSTERIOR  CIRCULATION: Visualized distal V4 segments are somewhat diminutive but largely code dominant and patent to the vertebrobasilar junction without stenosis. Neither PICA origin well seen or assessed on this exam. Basilar diminutive but patent to its distal aspect without stenosis. Superior cerebellar arteries patent bilaterally. 3 mm aneurysm seen extending superiorly from the basilar tip (series 5, image 89). Fetal type origin of the PCAs bilaterally. Both PCAs patent to their distal aspects without appreciable stenosis. IMPRESSION: 1. Negative MRA of the head and neck for large vessel occlusion or other acute abnormality. 2. 3 mm basilar tip aneurysm. 3. Fetal type origin of the PCAs with overall diminutive vertebrobasilar system. Electronically Signed   By: Jeannine Boga M.D.   On: 11/28/2021 02:08   MR ANGIO NECK WO CONTRAST  Result Date: 11/28/2021 CLINICAL DATA:  Follow-up examination for acute stroke. EXAM: MRA NECK WITHOUT CONTRAST MRA HEAD WITHOUT CONTRAST TECHNIQUE: Angiographic images of the Circle of Willis were acquired using MRA technique without intravenous contrast. COMPARISON:  Previous study from 11/26/2021. FINDINGS: MRA NECK FINDINGS AORTIC ARCH: Examination technically limited by lack of IV contrast and motion artifact. Aortic arch and origin of the great vessels not assessed on this exam. RIGHT CAROTID SYSTEM: Visualized right common and internal carotid arteries patent with antegrade flow. No visible hemodynamically significant stenosis. No findings to suggest dissection. LEFT CAROTID SYSTEM: Visualized left common and internal carotid arteries patent with antegrade flow. No visible hemodynamically significant stenosis. No findings to suggest dissection. VERTEBRAL ARTERIES: Both vertebral arteries appear to arise from the subclavian arteries, although the proximal vertebral arteries are not well assessed on this noncontrast examination. Antegrade flow seen within both vertebral  arteries within the neck. No visible significant stenosis or evidence for dissection. MRA HEAD FINDINGS ANTERIOR CIRCULATION: Examination mildly degraded by motion artifact. Both internal carotid arteries patent to the termini without stenosis or other abnormality. A1 segments patent. Normal anterior communicating artery complex. Anterior cerebral arteries patent without stenosis. No M1 stenosis or occlusion. No proximal MCA branch occlusion. Distal MCA branches perfused and symmetric. POSTERIOR CIRCULATION: Visualized distal V4 segments are somewhat diminutive but largely code dominant and patent to the vertebrobasilar junction without stenosis. Neither PICA origin well seen or assessed on this exam. Basilar diminutive but patent to its distal aspect without stenosis. Superior cerebellar arteries patent bilaterally. 3 mm aneurysm seen extending superiorly from the basilar tip (series 5, image 89). Fetal type origin of the PCAs bilaterally. Both PCAs patent to their distal aspects without appreciable stenosis. IMPRESSION: 1. Negative MRA of the head and neck for large vessel occlusion or other acute abnormality. 2. 3 mm basilar tip aneurysm. 3. Fetal type origin of the PCAs with overall diminutive  vertebrobasilar system. Electronically Signed   By: Jeannine Boga M.D.   On: 11/28/2021 02:08     Echo mild reduced left ventricular function EF 40 to 45%  TELEMETRY: Normal sinus rhythm rate of 70 nonspecific ST-T changes:  ASSESSMENT AND PLAN:  Principal Problem:   NSTEMI (non-ST elevated myocardial infarction) ([place]) Active Problems:   Chronic systolic CHF (congestive heart failure) (HCC)   Hypothyroidism   Chronic respiratory failure with hypoxia (HCC)   CKD stage 3 due to type 2 diabetes mellitus (HCC)   AKI (acute kidney injury) ([place])   Hyperkalemia   Depression   UTI (urinary tract infection)   Slurred speech   New onset a-fib ([place])   Acute ischemic stroke ([place])    Plan Patient  somewhat improved denies any chest pain shortness of breath or weakness Recommend continue supplemental oxygen for hypoxemia Aggressive medical therapy for non-STEMI elevated troponins Maintain adequate hydration for renal insufficiency Antibiotic therapy for urinary tract infection Anticoagulation long-term for atrial fibrillation and Acute ischemic stroke   Yolonda Kida, MD 11/29/2021 11:29 AM

## 2021-11-29 NOTE — Progress Notes (Signed)
Physical Therapy Treatment Patient Details Name: Jacqueline [name] MRN: [id number] DOB: 1932-09-02 Today's Date: 11/29/2021   History of Present Illness KATHYRN WARMUTH is a 88 y.o. female with medical history significant for chronic systolic CHF with last known LVEF of 20 to 25%, COPD with chronic respiratory failure on 4 L of oxygen continuous, diabetes mellitus with complications of stage III chronic kidney disease, GERD, hypothyroidism who was discharged from the hospital on 11/25/21 and returned to the ER within 24 hours for evaluation of multiple symptoms. Pt with new acute infarct R cerebellum.    PT Comments    Modified session today due to ongoing lethargy and awaiting repeat Head CT. Session consisted of AAROM B LE's in supine, pt kept eyes closed yet assisted with exercises. Pt repositioned to maintain skin integrity and prevent breakdown with recent decline in mobility.  Continue PT per POC. Continue to recommend SNF once medically stable.   Recommendations for follow up therapy are one component of a multi-disciplinary discharge planning process, led by the attending physician.  Recommendations may be updated based on patient status, additional functional criteria and insurance authorization.  Follow Up Recommendations  Skilled nursing-short term rehab (<3 hours/day)     Assistance Recommended at Discharge Intermittent Supervision/Assistance  Patient can return home with the following A little help with walking and/or transfers;A little help with bathing/dressing/bathroom;Assistance with cooking/housework;Help with stairs or ramp for entrance   Equipment Recommendations  None recommended by PT (defer to next level of care)    Recommendations for Other Services       Precautions / Restrictions Precautions Precautions: Fall Restrictions Weight Bearing Restrictions: No     Mobility  Bed Mobility                    Transfers                         Ambulation/Gait                   Stairs             Wheelchair Mobility    Modified Rankin (Stroke Patients Only)       Balance                                            Cognition Arousal/Alertness: Lethargic Behavior During Therapy: Flat affect Overall Cognitive Status: Impaired/Different from baseline Area of Impairment: Attention, Awareness                   Current Attention Level: Alternating       Awareness: Emergent   General Comments: Pt more fatigued today with difficulty keeping eyes open and responding to questions        Exercises General Exercises - Lower Extremity Ankle Circles/Pumps: Both, 10 reps, AAROM, 15 reps Long Arc Quad: AAROM, Both, 15 reps Hip ABduction/ADduction: AAROM, Both, 15 reps    General Comments General comments (skin integrity, edema, etc.):  (Pillow placed under B LE's to float heels and prevent breakdown)      Pertinent Vitals/Pain Pain Assessment Pain Assessment: No/denies pain    Home Living                          Prior Function  PT Goals (current goals can now be found in the care plan section) Acute Rehab PT Goals Patient Stated Goal: none stated    Frequency    Min 2X/week      PT Plan Current plan remains appropriate    Co-evaluation              AM-PAC PT "6 Clicks" Mobility   Outcome Measure  Help needed turning from your back to your side while in a flat bed without using bedrails?: A Little Help needed moving from lying on your back to sitting on the side of a flat bed without using bedrails?: A Little Help needed moving to and from a bed to a chair (including a wheelchair)?: A Little Help needed standing up from a chair using your arms (e.g., wheelchair or bedside chair)?: A Little Help needed to walk in hospital room?: A Lot Help needed climbing 3-5 steps with a railing? : A Lot 6 Click Score: 16    End of Session  Equipment Utilized During Treatment: Oxygen Activity Tolerance: Patient limited by lethargy Patient left: in bed;with call bell/phone within reach;with nursing/sitter in room   PT Visit Diagnosis: Difficulty in walking, not elsewhere classified (R26.2);Muscle weakness (generalized) (M62.81);Unsteadiness on feet (R26.81)     Time: 1315-1330 PT Time Calculation (min) (ACUTE ONLY): 15 min  Charges:  $Therapeutic Exercise: 8-22 mins                    Mikel Cella, PTA    Josie Dixon 11/29/2021, 1:55 PM

## 2021-11-29 NOTE — Progress Notes (Signed)
Pt was picked up by EMS to transport to [facility]. Pt packet was handed to EMS staff. Will continue to monitor.

## 2021-12-01 LAB — CULTURE, BLOOD (ROUTINE X 2)
Culture: NO GROWTH
Culture: NO GROWTH

## 2021-12-02 DIAGNOSIS — E1159 Type 2 diabetes mellitus with other circulatory complications: Secondary | ICD-10-CM

## 2021-12-02 DIAGNOSIS — F39 Unspecified mood [affective] disorder: Secondary | ICD-10-CM | POA: Diagnosis not present

## 2021-12-02 DIAGNOSIS — I5022 Chronic systolic (congestive) heart failure: Secondary | ICD-10-CM | POA: Diagnosis not present

## 2021-12-02 DIAGNOSIS — N183 Chronic kidney disease, stage 3 unspecified: Secondary | ICD-10-CM

## 2021-12-02 DIAGNOSIS — Z8673 Personal history of transient ischemic attack (TIA), and cerebral infarction without residual deficits: Secondary | ICD-10-CM

## 2021-12-02 DIAGNOSIS — I25119 Atherosclerotic heart disease of native coronary artery with unspecified angina pectoris: Secondary | ICD-10-CM | POA: Diagnosis not present

## 2021-12-02 DIAGNOSIS — I480 Paroxysmal atrial fibrillation: Secondary | ICD-10-CM | POA: Diagnosis not present

## 2021-12-17 NOTE — Progress Notes (Unsigned)
Patient ID: Jacqueline [name], female    DOB: 1933-02-02, 88 y.o.   MRN: [id number]  HPI  Offered to get [place] interpreter but patient and daughter declined. Patient able to answer most questions and daughter says she goes to every appointment with the patient.   Jacqueline [name] is a 88 y/o female with a history of DM, hyperlipidemia, HTN, CKD, thyroid disease, anemia, GERD, hyperkalemia, osteoporosis and chronic heart failure.   Echo report from 11/27/21 reviewed and showed an EF of 40-45% along with mild LVH and mild MR.   Admitted 11/26/21 due to lethargy, slurred speech diaphoresis with left shoulder pain radiating to the arm and back pain.  MRI brain was obtained and showed acute right cerebellar infarct. Troponin elevated. Neurology, cardiology and palliative care consults obtained. Diagnosed with NSTEMI but heparin deferred due to stroke. Found to have new onset AF. 6/15: repeated head CT given recurrent episode on minimal responsiveness, worsened speech and diaphoresis, no hemorrhagic conversion. Discharged after 3 days. Admitted 11/18/21 due to acute on chronic hypoxic respiratory failure initially requiring biPAP due to actue on chronic HF. Initially given IV lasix with transition to oral diuretics. Cardiology consult obtained.  PT/OT evaluations done. Discharged after 7 days to SNF.   She presents today for her initial visit with a chief complaint of minimal fatigue upon moderate exertion. Describes this as chronic in nature. She has associated cough, shortness of breath, pedal edema, abdominal distention (at times), dizziness and back pain along with this. She denies any difficulty sleeping, palpitations, chest pain or weight gain.   Currently at [facility] and says that she doesn't get weighed daily. Daughter thinks patient may be drinking too much but is unsure of exact amount. Feels like she was fluid restricted when in the hospital which then caused hypotension and then afib & clot developed.    Daughter says that it's difficult to get patient out to appointments so is trying to streamline everything.   Past Medical History:  Diagnosis Date   AKI (acute kidney injury) ([place]) 11/26/2021   Anemia    CHF (congestive heart failure) (H[facility])    Diabetes mellitus without complication ([place]) 78/29/5621   Currently no high BS.  Has come off meds.  But having episodes of low BS now.   Dilated idiopathic cardiomyopathy (HCC)    GERD (gastroesophageal reflux disease)    Hyperkalemia 11/26/2021   Hyperlipidemia    Hypertension    Hypothyroidism    Osteoporosis    Renal disorder    stage 3   Past Surgical History:  Procedure Laterality Date   BUNIONECTOMY     COLONOSCOPY     ESOPHAGOGASTRODUODENOSCOPY (EGD) WITH PROPOFOL N/A 10/15/2017   Procedure: ESOPHAGOGASTRODUODENOSCOPY (EGD) WITH PROPOFOL;  Surgeon: Lollie Sails, MD;  Location: [facility] ENDOSCOPY;  Service: Endoscopy;  Laterality: N/A;   KYPHOPLASTY N/A 06/12/2017   Procedure: HYQMVHQIONG-E9;  Surgeon: Hessie Knows, MD;  Location: [facility] ORS;  Service: Orthopedics;  Laterality: N/A;   TONSILLECTOMY     Family History  Problem Relation Age of Onset   Premature CHD Brother    Social History   Tobacco Use   Smoking status: Former    Types: Cigarettes    Quit date: 04/18/1984    Years since quitting: 37.6   Smokeless tobacco: Never  Substance Use Topics   Alcohol use: No   Allergies  Allergen Reactions   Ciprofloxacin    Digoxin And Related    Entresto [Sacubitril-Valsartan] Other (See Comments)  Hypotensive    Erythromycin    Lopid [Gemfibrozil]    Nitrofurantoin    Spironolactone Other (See Comments)    Hospitalized for dehydration   Cefuroxime Rash   Penicillins Rash    Has patient had a PCN reaction causing immediate rash, facial/tongue/throat swelling, SOB or lightheadedness with hypotension: Unknown Has patient had a PCN reaction causing severe rash involving mucus membranes or skin necrosis: No Has  patient had a PCN reaction that required hospitalization: No Has patient had a PCN reaction occurring within the last 10 years: No If all of the above answers are "NO", then may proceed with Cephalosporin use.    Prior to Admission medications   Medication Sig Start Date End Date Taking? Authorizing Provider  acetaminophen (TYLENOL) 325 MG tablet Take 2 tablets (650 mg total) by mouth every 4 (four) hours as needed for headache or mild pain. 11/29/21  Yes Ezekiel Slocumb, DO  apixaban (ELIQUIS) 2.5 MG TABS tablet Take 1 tablet (2.5 mg total) by mouth 2 (two) times daily. Start on 11/30/2021 11/29/21  Yes Nicole Kindred A, DO  aspirin 81 MG chewable tablet Chew 1 tablet (81 mg total) by mouth daily. 11/30/21  Yes Nicole Kindred A, DO  atorvastatin (LIPITOR) 80 MG tablet Take 1 tablet (80 mg total) by mouth daily. 11/30/21  Yes Nicole Kindred A, DO  ergocalciferol (VITAMIN D2) 50000 units capsule Take 50,000 Units by mouth once a week.   Yes [provider]  glucose blood test strip USE TO CHECK BLOOD SUGARS TWICE A DAY 02/19/16  Yes [provider]  JARDIANCE 10 MG TABS tablet Take 10 mg by mouth daily. 09/23/21  Yes [provider]  levothyroxine (SYNTHROID, LEVOTHROID) 25 MCG tablet Take 25 mcg by mouth daily before breakfast.   Yes [provider]  pantoprazole (PROTONIX) 40 MG tablet Take 40 mg by mouth daily.   Yes [provider]  senna-docusate (SENOKOT-S) 8.6-50 MG tablet Take 1 tablet by mouth at bedtime as needed for mild constipation. 11/29/21  Yes Nicole Kindred A, DO  sertraline (ZOLOFT) 50 MG tablet Take 50 mg by mouth daily.   Yes [provider]  torsemide (DEMADEX) 10 MG tablet Take 10 mg by mouth daily. 11/27/21  Yes [provider]  alendronate (FOSAMAX) 70 MG tablet Take 70 mg by mouth once a week. Take with a full glass of water on an empty stomach. Patient not taking: Reported on 12/18/2021    [provider]    Review of Systems  Constitutional:  Positive for fatigue. Negative for appetite change.  HENT:  Negative for congestion, postnasal drip and sore throat.   Eyes: Negative.   Respiratory:  Positive for cough and shortness of breath.   Cardiovascular:  Positive for leg swelling. Negative for chest pain and palpitations.  Gastrointestinal:  Positive for abdominal distention (sometimes). Negative for abdominal pain.  Endocrine: Negative.   Genitourinary: Negative.   Musculoskeletal:  Positive for back pain. Negative for arthralgias.  Skin: Negative.   Allergic/Immunologic: Negative.   Neurological:  Positive for dizziness and light-headedness.  Hematological:  Negative for adenopathy. Does not bruise/bleed easily.  Psychiatric/Behavioral:  Negative for dysphoric mood and sleep disturbance (sleeping on 1-2 pillows). The patient is not nervous/anxious.     Vitals:   12/18/21 1229  BP: (!) 142/66  Pulse: 78  Resp: 16  SpO2: 100%  Weight: 126 lb 4 oz (57.3 kg)  Height: '4\' 9"'$  (1.448 m)   Wt Readings from Last  3 Encounters:  12/18/21 126 lb 4 oz (57.3 kg)  11/26/21 134 lb 4.2 oz (60.9 kg)  11/23/21 126 lb 5.2 oz (57.3 kg)   Lab Results  Component Value Date   CREATININE 0.89 11/29/2021   CREATININE 0.95 11/28/2021   CREATININE 1.34 (H) 11/27/2021   Physical Exam Vitals and nursing note reviewed. Exam conducted with a chaperone present (daughter).  Constitutional:      Appearance: Normal appearance.  HENT:     Head: Normocephalic and atraumatic.  Cardiovascular:     Rate and Rhythm: Normal rate. Rhythm irregular.  Pulmonary:     Effort: Pulmonary effort is normal. No respiratory distress.     Breath sounds: No wheezing or rales.  Abdominal:     General: There is no distension.     Palpations: Abdomen is soft.  Musculoskeletal:        General: No tenderness.     Cervical back: Normal range of motion and neck supple.     Right lower leg: Edema (trace pitting) present.      Left lower leg: Edema (1+ pitting) present.  Skin:    General: Skin is warm and dry.  Neurological:     General: No focal deficit present.     Mental Status: She is alert and oriented to person, place, and time.  Psychiatric:        Mood and Affect: Mood normal.        Behavior: Behavior normal.    Assessment & Plan:  1: Chronic heart failure with reduced ejection fraction- - NYHA class II - euvolemic today - not being weighed daily at facility so order written to be weighed daily and to call for an overnight weight gain of > 2 pounds or a weekly weight gain of > 5 pounds - she says that normally at home, she would weigh herself daily - on GDMT of jardiance - has allergy to spironolactone & entresto (hypotension) - has been hypotensive in the past so may not be able to tolerate beta-blocker - saw cardiology Nehemiah Massed) 12/05/21 - had video visit with palliative care on 10/08/21; daughter is waiting on another appointment - reviewed daily fluid intake to 60-64 ounces but daughter is concerned that if she doesn't drink enough, her BP will drop again - explained that as long as her weight was stable and swelling doesn't get worse, her current fluid intake will be fine; she currently drinks from a "very large" cup - wearing oxygen at 4L around the clock - not adding any salt to her food - BNP 11/26/21 was 454.6  2: HTN- - BP mildly elevated (142/66) - saw PCP (Hande) 07/19/21; now seeing PCP at facility - [facility] 11/29/21 reviewed and showed sodium 139, potassium 4.2, creatinine 0.89 and GFR >60  3: DM- - A1c 11/18/21 was 6.5%   Facility medication list reviewed. Daughter says that it's very difficult to get patient out for appointments. Due to current HF stability, will not make a return appointment at this time. Advised to follow closely with cardiology but that they could call back at anytime to make another appointment. Daughter was comfortable with this plan.

## 2021-12-18 ENCOUNTER — Ambulatory Visit: Payer: Medicare Other | Attending: Family | Admitting: Family

## 2021-12-18 ENCOUNTER — Encounter: Payer: Self-pay | Admitting: Family

## 2021-12-18 VITALS — BP 142/66 | HR 78 | Resp 16 | Ht <= 58 in | Wt 126.2 lb

## 2021-12-18 DIAGNOSIS — R059 Cough, unspecified: Secondary | ICD-10-CM | POA: Diagnosis not present

## 2021-12-18 DIAGNOSIS — R0602 Shortness of breath: Secondary | ICD-10-CM | POA: Insufficient documentation

## 2021-12-18 DIAGNOSIS — E1122 Type 2 diabetes mellitus with diabetic chronic kidney disease: Secondary | ICD-10-CM | POA: Insufficient documentation

## 2021-12-18 DIAGNOSIS — K219 Gastro-esophageal reflux disease without esophagitis: Secondary | ICD-10-CM | POA: Insufficient documentation

## 2021-12-18 DIAGNOSIS — I130 Hypertensive heart and chronic kidney disease with heart failure and stage 1 through stage 4 chronic kidney disease, or unspecified chronic kidney disease: Secondary | ICD-10-CM | POA: Insufficient documentation

## 2021-12-18 DIAGNOSIS — Z9981 Dependence on supplemental oxygen: Secondary | ICD-10-CM | POA: Insufficient documentation

## 2021-12-18 DIAGNOSIS — R140 Abdominal distension (gaseous): Secondary | ICD-10-CM | POA: Insufficient documentation

## 2021-12-18 DIAGNOSIS — I252 Old myocardial infarction: Secondary | ICD-10-CM | POA: Diagnosis not present

## 2021-12-18 DIAGNOSIS — Z7984 Long term (current) use of oral hypoglycemic drugs: Secondary | ICD-10-CM | POA: Diagnosis not present

## 2021-12-18 DIAGNOSIS — I10 Essential (primary) hypertension: Secondary | ICD-10-CM | POA: Diagnosis not present

## 2021-12-18 DIAGNOSIS — R5383 Other fatigue: Secondary | ICD-10-CM | POA: Insufficient documentation

## 2021-12-18 DIAGNOSIS — I248 Other forms of acute ischemic heart disease: Secondary | ICD-10-CM

## 2021-12-18 DIAGNOSIS — M810 Age-related osteoporosis without current pathological fracture: Secondary | ICD-10-CM | POA: Diagnosis not present

## 2021-12-18 DIAGNOSIS — E785 Hyperlipidemia, unspecified: Secondary | ICD-10-CM | POA: Insufficient documentation

## 2021-12-18 DIAGNOSIS — R42 Dizziness and giddiness: Secondary | ICD-10-CM | POA: Diagnosis not present

## 2021-12-18 DIAGNOSIS — N189 Chronic kidney disease, unspecified: Secondary | ICD-10-CM | POA: Diagnosis not present

## 2021-12-18 DIAGNOSIS — I5022 Chronic systolic (congestive) heart failure: Secondary | ICD-10-CM

## 2021-12-18 DIAGNOSIS — M549 Dorsalgia, unspecified: Secondary | ICD-10-CM | POA: Diagnosis not present

## 2021-12-18 DIAGNOSIS — E119 Type 2 diabetes mellitus without complications: Secondary | ICD-10-CM | POA: Diagnosis not present

## 2021-12-18 NOTE — Patient Instructions (Addendum)
Continue weighing daily and call for an overnight weight gain of 3 pounds or more or a weekly weight gain of more than 5 pounds.  ° ° ° °Call us in the future if you need us for anything °

## 2021-12-20 ENCOUNTER — Non-Acute Institutional Stay: Payer: Medicare Other | Admitting: Student

## 2021-12-20 DIAGNOSIS — Z515 Encounter for palliative care: Secondary | ICD-10-CM

## 2021-12-20 DIAGNOSIS — R0602 Shortness of breath: Secondary | ICD-10-CM

## 2021-12-20 DIAGNOSIS — K5900 Constipation, unspecified: Secondary | ICD-10-CM

## 2021-12-20 DIAGNOSIS — I5022 Chronic systolic (congestive) heart failure: Secondary | ICD-10-CM

## 2021-12-20 DIAGNOSIS — R531 Weakness: Secondary | ICD-10-CM

## 2021-12-20 NOTE — Progress Notes (Unsigned)
  [facility] Consult Note Telephone: [phone]  Fax: [phone]    Date of encounter: 12/20/21 4:07 PM PATIENT NAME: Jacqueline [name] [address] [place] [address]   [phone] (home)  DOB: 12/30/1932 MRN: [id number] PRIMARY CARE PROVIDER:    Hande, Vishwanath, MD,  [address] [facility] [place] [address] [phone]  REFERRING PROVIDER:   Hande, Vishwanath, MD [address] [facility] [place],  [place] [address] [phone]  RESPONSIBLE PARTY:    Contact Information     Name Relation Home Work Mobile   Jacqueline [name] Daughter [phone]   Jacqueline [name] [phone]     Jacqueline [name]   [phone]        I met face to face with patient in the facility. Palliative Care was asked to follow this patient by consultation request of  Hande, Vishwanath, MD to address advance care planning and complex medical decision making. This is a follow up visit.  Spoke with daughter via telephone.                                     ASSESSMENT AND PLAN / RECOMMENDATIONS:   Advance Care Planning/Goals of Care: Goals include to maximize quality of life and symptom management. Patient/health care surrogate gave his/her permission to discuss. Our advance care planning conversation included a discussion about:    The value and importance of advance care planning  Experiences with loved ones who have been seriously ill or have died  Exploration of personal, cultural or spiritual beliefs that might influence medical decisions  Exploration of goals of care in the event of a sudden injury or illness  DNR per facility; clarified with daughter.  CODE STATUS: DNR  Education provided on Palliative Medicine. Patient has transitioned to long term care at [facility]. She is adjusting well to SNF. Will continue to provide supportive care, symptom  management as directed.   Symptom Management/Plan:  Heart failure-patient endorses shortness of breath with exertion; also has chronic respiratory failure. Patient appears euvolemic today. Continue HF medications as directed. She is to be weighed daily; notify HF clinic if weight gain of 3 pounds or more overnight or 5 pounds in a week. Continue oxygen at 4 lpm continuously via nasal canula. Follow up with cardiology and HF clinic as scheduled.    Generalized weakness-secondary to HF, ischemic stroke. Requires assistance with adl's. Use walker for ambulation. Monitor for falls/safety. Continue therapy as directed.   Constipation- senna-S 2 tabs daily, add prune juice daily to meal tray.   Follow up Palliative Care Visit: Palliative care will continue to follow for complex medical decision making, advance care planning, and clarification of goals. Return in 4-6 weeks or prn.  This visit was coded based on medical decision making (MDM).   HOSPICE ELIGIBILITY/DIAGNOSIS: TBD  Chief Complaint: Palliative Medicine follow up visit.   HISTORY OF PRESENT ILLNESS:  Jacqueline [name] is a 89 y.o. year old female  with chronic systolic heart failure EF 20-25%, chronic respiratory failure with hypoxia, CKD 3B, hypertension, hyperlipidemia, Type 2 diabetes with hyperglycemia, hypothyroidism, GERD, anemia, arthritis, dilated idiopathic cardiomyopathy, liver cirrhosis. Hospital admissions 6/5-6/12/23 due to acute on chronic systolic CHF, acute on chronic respiratory failure requiring BiPAP; she returned on 6/13 and admitted due to NSTEMI, new atrial fibrillation, acute ischemic stroke.   Patient has   moved to [facility]. She is adjusting well. She denies pain, edema. She endorses shortness of breath with exertion. Wears oxygen at 3-4 lpm. She is receiving therapy. She requires assistance with adl's. She endorses a good appetite. Endorses constipation. She is sleeping well. A 10-point ROS is negative, except for  the pertinent positives and negatives detailed per HPI.    History obtained from review of EMR, discussion with primary team, and interview with family, facility staff/caregiver and/or Jacqueline [name].  I reviewed available labs, medications, imaging, studies and related documents from the EMR.  Records reviewed and summarized above.   Physical Exam: Weight: 124.6 pounds Pulse 82, resp 16, sats 98% on 3.5 lpm Constitutional: NAD General: frail appearing EYES: anicteric sclera, lids intact, no discharge  ENMT: intact hearing, oral mucous membranes moist, dentition intact CV: S1S2, RRR, no LE edema Pulmonary: LCTA, no increased work of breathing, no cough Abdomen: normo-active BS + 4 quadrants, soft and non tender, no ascites GU: deferred MSK: moves all extremities, ambulatory Skin: warm and dry, no rashes or wounds on visible skin Neuro: +generalized weakness,  A  &O x 3 Psych: non-anxious affect, pleasant Hem/lymph/immuno: no widespread bruising   Thank you for the opportunity to participate in the care of Jacqueline [name].  The palliative care team will continue to follow. Please call our office at [phone] if we can be of additional assistance.   LaToya S Rivers, NP   COVID-19 PATIENT SCREENING TOOL Asked and negative response unless otherwise noted:   Have you had symptoms of covid, tested positive or been in contact with someone with symptoms/positive test in the past 5-10 days? No  

## 2021-12-24 DIAGNOSIS — I4891 Unspecified atrial fibrillation: Secondary | ICD-10-CM | POA: Diagnosis not present

## 2021-12-24 DIAGNOSIS — I5022 Chronic systolic (congestive) heart failure: Secondary | ICD-10-CM | POA: Diagnosis not present

## 2021-12-24 DIAGNOSIS — I25119 Atherosclerotic heart disease of native coronary artery with unspecified angina pectoris: Secondary | ICD-10-CM | POA: Diagnosis not present

## 2021-12-24 DIAGNOSIS — E1159 Type 2 diabetes mellitus with other circulatory complications: Secondary | ICD-10-CM

## 2021-12-24 DIAGNOSIS — F39 Unspecified mood [affective] disorder: Secondary | ICD-10-CM | POA: Diagnosis not present

## 2021-12-31 DIAGNOSIS — I5023 Acute on chronic systolic (congestive) heart failure: Secondary | ICD-10-CM | POA: Diagnosis not present

## 2022-01-09 DIAGNOSIS — I4891 Unspecified atrial fibrillation: Secondary | ICD-10-CM | POA: Diagnosis not present

## 2022-01-09 DIAGNOSIS — R000 Tachycardia, unspecified: Secondary | ICD-10-CM | POA: Diagnosis not present

## 2022-01-09 DIAGNOSIS — J9611 Chronic respiratory failure with hypoxia: Secondary | ICD-10-CM | POA: Diagnosis not present

## 2022-01-09 DIAGNOSIS — J449 Chronic obstructive pulmonary disease, unspecified: Secondary | ICD-10-CM | POA: Diagnosis not present

## 2022-01-24 DIAGNOSIS — E1159 Type 2 diabetes mellitus with other circulatory complications: Secondary | ICD-10-CM

## 2022-01-24 DIAGNOSIS — N183 Chronic kidney disease, stage 3 unspecified: Secondary | ICD-10-CM

## 2022-01-24 DIAGNOSIS — I4891 Unspecified atrial fibrillation: Secondary | ICD-10-CM

## 2022-01-24 DIAGNOSIS — I5030 Unspecified diastolic (congestive) heart failure: Secondary | ICD-10-CM

## 2022-01-24 DIAGNOSIS — J9611 Chronic respiratory failure with hypoxia: Secondary | ICD-10-CM

## 2022-01-24 DIAGNOSIS — F39 Unspecified mood [affective] disorder: Secondary | ICD-10-CM

## 2022-02-10 LAB — BASIC METABOLIC PANEL
BUN: 27 — AB (ref 4–21)
CO2: 36 — AB (ref 13–22)
Chloride: 100 (ref 99–108)
Creatinine: 1.2 — AB (ref 0.5–1.1)
Glucose: 112
Potassium: 3.9 mEq/L (ref 3.5–5.1)
Sodium: 142 (ref 137–147)

## 2022-02-10 LAB — COMPREHENSIVE METABOLIC PANEL
Albumin: 3.8 (ref 3.5–5.0)
Calcium: 9.2 (ref 8.7–10.7)
eGFR: 43

## 2022-02-24 DIAGNOSIS — N1832 Chronic kidney disease, stage 3b: Secondary | ICD-10-CM

## 2022-02-24 DIAGNOSIS — E1159 Type 2 diabetes mellitus with other circulatory complications: Secondary | ICD-10-CM

## 2022-02-24 DIAGNOSIS — F39 Unspecified mood [affective] disorder: Secondary | ICD-10-CM

## 2022-02-24 DIAGNOSIS — I480 Paroxysmal atrial fibrillation: Secondary | ICD-10-CM

## 2022-02-24 DIAGNOSIS — I5022 Chronic systolic (congestive) heart failure: Secondary | ICD-10-CM

## 2022-02-24 DIAGNOSIS — I25119 Atherosclerotic heart disease of native coronary artery with unspecified angina pectoris: Secondary | ICD-10-CM

## 2022-02-26 ENCOUNTER — Encounter: Payer: Self-pay | Admitting: Student

## 2022-02-26 ENCOUNTER — Non-Acute Institutional Stay (SKILLED_NURSING_FACILITY): Payer: Medicare Other | Admitting: Student

## 2022-02-26 DIAGNOSIS — Z9981 Dependence on supplemental oxygen: Secondary | ICD-10-CM

## 2022-02-26 DIAGNOSIS — R4781 Slurred speech: Secondary | ICD-10-CM

## 2022-02-26 DIAGNOSIS — K219 Gastro-esophageal reflux disease without esophagitis: Secondary | ICD-10-CM

## 2022-02-26 DIAGNOSIS — I420 Dilated cardiomyopathy: Secondary | ICD-10-CM | POA: Insufficient documentation

## 2022-02-26 NOTE — Progress Notes (Signed)
Location:   [facility] Room Number: 419-F Place of Service:  SNF [phone]) Provider:  Dr.Christiona Siddique  PCP: Dewayne Shorter, MD  Patient Care Team: Dewayne Shorter, MD as PCP - General (Family Medicine) Clent Jacks, RN as Registered Nurse  Extended Emergency Contact Information Primary Emergency Contact: [name] Address: [address]          [place], [place] [address] [name] of [place] Phone: [phone] Work Phone: [phone] Mobile Phone: [phone] Relation: Daughter Secondary Emergency Contact: Shirlee Limerick Home Phone: [phone] Relation: Son  Code Status:   Goals of care: Advanced Directive information    11/26/2021    6:02 PM  Advanced Directives  Does Patient Have a Medical Advance Directive? Yes  Type of Paramedic of [place];Living will;Out of facility DNR (pink MOST or yellow form)  Does patient want to make changes to medical advance directive? No - Patient declined  Copy of [place] in Chart? No - copy requested  Pre-existing out of facility DNR order (yellow form or pink MOST form) Yellow form placed in chart (order not valid for inpatient use)     Chief Complaint  Patient presents with   Acute Visit    Acid reflux.    HPI:  Pt is a 88 y.o. female seen today for an acute visit for concern for fluid in the back of her throat causing issues bleeding.  Patient says sometimes she wakes up with burning in the back of her throat. Patient already takes medication for acid reflux and has been told she has barrett's esophagus. Discussed with daughter concern for use of medications that can contribute to confusion if given for increased secretions. Patient is managing a regular diet well and communicated clearly despite her slurred speech secondary to prior CVA.   Spoke with patient's daughter and her concern was that she may have fluid in her lungs or the back of her throat.     Past Medical History:  Diagnosis Date   AKI (acute kidney injury) ([place]) 11/26/2021   Anemia    CHF (congestive heart failure) (HCC)    Diabetes mellitus without complication ([place]) 85/63/1497   Currently no high BS.  Has come off meds.  But having episodes of low BS now.   Dilated idiopathic cardiomyopathy (HCC)    GERD (gastroesophageal reflux disease)    Hyperkalemia 11/26/2021   Hyperlipidemia    Hypertension    Hypothyroidism    Osteoporosis    Renal disorder    stage 3   Past Surgical History:  Procedure Laterality Date   BUNIONECTOMY     COLONOSCOPY     ESOPHAGOGASTRODUODENOSCOPY (EGD) WITH PROPOFOL N/A 10/15/2017   Procedure: ESOPHAGOGASTRODUODENOSCOPY (EGD) WITH PROPOFOL;  Surgeon: Lollie Sails, MD;  Location: [facility] ENDOSCOPY;  Service: Endoscopy;  Laterality: N/A;   KYPHOPLASTY N/A 06/12/2017   Procedure: [id number];  Surgeon: Hessie Knows, MD;  Location: [facility] ORS;  Service: Orthopedics;  Laterality: N/A;   TONSILLECTOMY      Allergies  Allergen Reactions   Ciprofloxacin    Digoxin And Related    Entresto [Sacubitril-Valsartan] Other (See Comments)    Hypotensive    Erythromycin    Lopid [Gemfibrozil]    Nitrofurantoin    Spironolactone Other (See Comments)    Hospitalized for dehydration   Cefuroxime Rash   Penicillins Rash    Has patient had a PCN reaction causing immediate rash, facial/tongue/throat swelling, SOB or lightheadedness with hypotension: Unknown Has patient  had a PCN reaction causing severe rash involving mucus membranes or skin necrosis: No Has patient had a PCN reaction that required hospitalization: No Has patient had a PCN reaction occurring within the last 10 years: No If all of the above answers are "NO", then may proceed with Cephalosporin use.     Allergies as of 02/26/2022       Reactions   Ciprofloxacin    Digoxin And Related    Entresto [sacubitril-valsartan] Other (See Comments)   Hypotensive   Erythromycin     Lopid [gemfibrozil]    Nitrofurantoin    Spironolactone Other (See Comments)   Hospitalized for dehydration   Cefuroxime Rash   Penicillins Rash   Has patient had a PCN reaction causing immediate rash, facial/tongue/throat swelling, SOB or lightheadedness with hypotension: Unknown Has patient had a PCN reaction causing severe rash involving mucus membranes or skin necrosis: No Has patient had a PCN reaction that required hospitalization: No Has patient had a PCN reaction occurring within the last 10 years: No If all of the above answers are "NO", then may proceed with Cephalosporin use.        Medication List        Accurate as of February 26, 2022  5:09 PM. If you have any questions, ask your nurse or doctor.          STOP taking these medications    alendronate 70 MG tablet Commonly known as: FOSAMAX Stopped by: Dewayne Shorter, MD   glucose blood test strip Stopped by: Dewayne Shorter, MD       TAKE these medications    acetaminophen 325 MG tablet Commonly known as: TYLENOL Take 2 tablets (650 mg total) by mouth every 4 (four) hours as needed for headache or mild pain.   apixaban 2.5 MG Tabs tablet Commonly known as: ELIQUIS Take 1 tablet (2.5 mg total) by mouth 2 (two) times daily. Start on 11/30/2021   aspirin 81 MG chewable tablet Chew 1 tablet (81 mg total) by mouth daily.   atorvastatin 80 MG tablet Commonly known as: LIPITOR Take 1 tablet (80 mg total) by mouth daily.   diclofenac Sodium 1 % Gel Commonly known as: VOLTAREN Apply topically every 6 (six) hours as needed.   ergocalciferol 1.25 MG (50000 UT) capsule Commonly known as: VITAMIN D2 Take 50,000 Units by mouth once a week.   Jardiance 10 MG Tabs tablet Generic drug: empagliflozin Take 10 mg by mouth daily.   levothyroxine 25 MCG tablet Commonly known as: SYNTHROID Take 25 mcg by mouth daily before breakfast.   pantoprazole 40 MG tablet Commonly known as: PROTONIX Take 40 mg by  mouth daily.   senna-docusate 8.6-50 MG tablet Commonly known as: Senokot-S Take 1 tablet by mouth at bedtime as needed for mild constipation.   sertraline 50 MG tablet Commonly known as: ZOLOFT Take 50 mg by mouth daily.   torsemide 10 MG tablet Commonly known as: DEMADEX Take 20 mg by mouth daily.   valsartan 80 MG tablet Commonly known as: DIOVAN Take 160 mg by mouth daily.        Review of Systems  Unable to perform ROS: Other (difficult to assess due to verbal limitations)    Immunization History  Administered Date(s) Administered   Influenza Inj Mdck Quad Pf 03/10/2019, 03/18/2021   Influenza Split 04/18/2014, 05/01/2015   Influenza,inj,Quad PF,6+ Mos 03/12/2020   Influenza-Unspecified 03/24/2012, 03/24/2013, 04/18/2014, 05/01/2015   PFIZER Comirnaty(Gray Top)Covid-19 Tri-Sucrose Vaccine 07/21/2019, 08/12/2019   Pfizer Covid-19  Vaccine Bivalent Booster 58yr & up 11/22/2021   Pneumococcal Polysaccharide-23 08/20/2015   Pertinent  Health Maintenance Due  Topic Date Due   FOOT EXAM  Never done   OPHTHALMOLOGY EXAM  Never done   DEXA SCAN  Never done   INFLUENZA VACCINE  01/14/2022   HEMOGLOBIN A1C  05/20/2022      11/27/2021    9:47 PM 11/28/2021    8:15 AM 11/28/2021    7:57 PM 11/29/2021    7:59 AM 12/18/2021   12:34 PM  Fall Risk  Falls in the past year?     0  Was there an injury with Fall?     0  Fall Risk Category Calculator     0  Fall Risk Category     Low  Patient Fall Risk Level High fall risk High fall risk High fall risk High fall risk Moderate fall risk  Patient at Risk for Falls Due to     Impaired mobility;Impaired balance/gait  Fall risk Follow up     Falls evaluation completed;Education provided;Falls prevention discussed   Functional Status Survey:    Vitals:   02/26/22 1127  BP: (!) 126/58  Pulse: 94  Resp: 18  Temp: (!) 97 F (36.1 C)  SpO2: 95%  Weight: 125 lb 9.6 oz (57 kg)  Height: '4\' 9"'$  (1.448 m)   Body mass index is  27.18 kg/m. Physical Exam Constitutional:      Appearance: She is normal weight.  Cardiovascular:     Pulses: Normal pulses.  Pulmonary:     Effort: Pulmonary effort is normal.     Breath sounds: Normal breath sounds.  Abdominal:     General: Abdomen is flat.     Palpations: Abdomen is soft.     Tenderness: There is no abdominal tenderness. There is no guarding.  Neurological:     Mental Status: She is alert. Mental status is at baseline.    Labs reviewed: Recent Labs    11/19/21 0457 11/20/21 0539 11/26/21 1223 11/26/21 1436 11/27/21 0613 11/27/21 0655 11/28/21 0612 11/29/21 0653  NA 142   < > 134*   < > 137  --  139 139  K 3.7   < > 6.1*   < > 4.6  --  3.9 4.2  CL 102   < > 93*   < > 100  --  104 106  CO2 33*   < > 30   < > 25  --  27 28  GLUCOSE 115*   < > 156*   < > 117*  --  105* 120*  BUN 26*   < > 71*   < > 66*  --  51* 37*  CREATININE 1.09*   < > 1.83*   < > 1.34*  --  0.95 0.89  CALCIUM 8.6*   < > 8.8*   < > 8.8*  --  8.7* 8.4*  MG 2.5*  --  2.7*  --   --  2.6*  --   --    < > = values in this interval not displayed.   Recent Labs    11/18/21 1459 11/26/21 1223  AST 29 34  ALT 24 14  ALKPHOS 71 51  BILITOT 1.1 1.3*  PROT 7.7 7.0  ALBUMIN 3.8 3.5   Recent Labs    11/18/21 0136 11/18/21 1459 11/19/21 0457 11/26/21 1223 11/27/21 0613 11/28/21 0612  WBC 4.8 11.7*   < > 8.2 7.2 6.1  NEUTROABS 2.4  8.2*  --  5.5  --   --   HGB 16.7* 18.2*   < > 16.3* 15.8* 15.1*  HCT 53.6* 58.3*   < > 51.5* 49.6* 47.5*  MCV 93.1 94.5   < > 91.0 92.9 91.9  PLT 101* 110*   < > 142* 119* 167   < > = values in this interval not displayed.   Lab Results  Component Value Date   TSH 2.925 11/18/2021   Lab Results  Component Value Date   HGBA1C 6.5 (H) 11/18/2021   Lab Results  Component Value Date   CHOL 262 (H) 11/27/2021   HDL 34 (L) 11/27/2021   LDLCALC 165 (H) 11/27/2021   TRIG 315 (H) 11/27/2021   CHOLHDL 7.7 11/27/2021    Significant Diagnostic  Results in last 30 days:  No results found.  Assessment/Plan 1. Gastroesophageal reflux disease without esophagitis Patient with persistent symptoms of reflux in the morning. Discussed possible treatments for oral secretions such as scopolamine or atropine, and neither of those are appropriate for patient at this time. Lungs clear and patient remains afebrile on her normal oxygen requirement. No upper airway sounds today. Patient has been on protonix for sometime. Will plan for trial of increased frequency. Will start Protonix '40mg'$  BID today. Will also provide Tums '600mg'$  TID with meals PRN.   2. Slurred speech Difficulty with communication with patient due to slurred speech. This is stable and unchanged.    Family/ staff Communication: Daughter and nursing updated.   Labs/tests ordered:  none  Tomasa Rand, MD, [facility] [phone]

## 2022-04-28 ENCOUNTER — Non-Acute Institutional Stay: Payer: Medicare Other | Admitting: Student

## 2022-04-28 ENCOUNTER — Encounter: Payer: Self-pay | Admitting: Student

## 2022-04-28 DIAGNOSIS — I4891 Unspecified atrial fibrillation: Secondary | ICD-10-CM | POA: Diagnosis not present

## 2022-04-28 DIAGNOSIS — H3530 Unspecified macular degeneration: Secondary | ICD-10-CM

## 2022-04-28 DIAGNOSIS — M549 Dorsalgia, unspecified: Secondary | ICD-10-CM

## 2022-04-28 DIAGNOSIS — I639 Cerebral infarction, unspecified: Secondary | ICD-10-CM

## 2022-04-28 DIAGNOSIS — R531 Weakness: Secondary | ICD-10-CM

## 2022-04-28 DIAGNOSIS — E119 Type 2 diabetes mellitus without complications: Secondary | ICD-10-CM

## 2022-04-28 DIAGNOSIS — R600 Localized edema: Secondary | ICD-10-CM

## 2022-04-28 DIAGNOSIS — E039 Hypothyroidism, unspecified: Secondary | ICD-10-CM

## 2022-04-28 DIAGNOSIS — I214 Non-ST elevation (NSTEMI) myocardial infarction: Secondary | ICD-10-CM | POA: Diagnosis not present

## 2022-04-28 DIAGNOSIS — I5022 Chronic systolic (congestive) heart failure: Secondary | ICD-10-CM

## 2022-04-28 DIAGNOSIS — Z0000 Encounter for general adult medical examination without abnormal findings: Secondary | ICD-10-CM

## 2022-04-28 DIAGNOSIS — E1122 Type 2 diabetes mellitus with diabetic chronic kidney disease: Secondary | ICD-10-CM

## 2022-04-28 DIAGNOSIS — Z9981 Dependence on supplemental oxygen: Secondary | ICD-10-CM

## 2022-04-28 DIAGNOSIS — K219 Gastro-esophageal reflux disease without esophagitis: Secondary | ICD-10-CM

## 2022-04-28 DIAGNOSIS — F3341 Major depressive disorder, recurrent, in partial remission: Secondary | ICD-10-CM

## 2022-04-28 DIAGNOSIS — I420 Dilated cardiomyopathy: Secondary | ICD-10-CM

## 2022-04-28 DIAGNOSIS — N183 Chronic kidney disease, stage 3 unspecified: Secondary | ICD-10-CM

## 2022-04-28 DIAGNOSIS — I10 Essential (primary) hypertension: Secondary | ICD-10-CM

## 2022-04-28 DIAGNOSIS — J9611 Chronic respiratory failure with hypoxia: Secondary | ICD-10-CM

## 2022-04-28 DIAGNOSIS — E785 Hyperlipidemia, unspecified: Secondary | ICD-10-CM | POA: Insufficient documentation

## 2022-04-28 LAB — HEMOGLOBIN A1C: Hemoglobin A1C: 6.5

## 2022-04-28 NOTE — Progress Notes (Signed)
Location[facility]  [facility] Room Number: [id number] Place of Service:  SNF (31) Provider:  Dr. Amada Kingfisher, MD  Patient Care Team: Jacqueline Shorter, MD as PCP - General (Family Medicine) Jacqueline Jacks, RN as Registered Nurse  Extended Emergency Contact Information Primary Emergency Contact: Jacqueline [name] Address: [address]          [place], [place] [address] Jacqueline [name] of [place] Phone: [phone] Work Phone: [phone] Mobile Phone: [phone] Relation: Daughter Secondary Emergency Contact: Jacqueline [name] Home Phone: [phone] Relation: Son  Code Status:  DNR Goals of care: Advanced Directive information    04/28/2022    9:59 AM  Advanced Directives  Does Patient Have a Medical Advance Directive? Yes  Type of Advance Directive Out of facility DNR (pink MOST or yellow form)  Does patient want to make changes to medical advance directive? No - Patient declined     Chief Complaint  Patient presents with   Medical Management of Chronic Issues    Medical Management of Chronic Issues    HPI:  Pt is a 88 y.o. female seen today for medical management of chronic diseases.   She is playing yahtzee with her daughter Jacqueline [name].   Prefer to leave medications as is given her stability  Will make Diclofenac daily scheduled.   Previously when she transitioned to the facility she was in afib. Unclear if it was because of the booster or something else.   She feels well. Only issue is back pain.   Eye doctor "don't do not good." Dry Macular degeneration - plan for visit in December.   Okay with getting the shingles shot.   She had flu shot. Open to second PCV.   Past Medical History:  Diagnosis Date   AKI (acute kidney injury) ([place]) 11/26/2021   Anemia    CHF (congestive heart failure) (HCC)    Diabetes mellitus without complication ([place]) 09/47/0962   Currently no high BS.  Has come off meds.  But having episodes of  low BS now.   Dilated idiopathic cardiomyopathy (HCC)    GERD (gastroesophageal reflux disease)    Hyperkalemia 11/26/2021   Hyperlipidemia    Hypertension    Hypothyroidism    Osteoporosis    Renal disorder    stage 3   Past Surgical History:  Procedure Laterality Date   BUNIONECTOMY     COLONOSCOPY     ESOPHAGOGASTRODUODENOSCOPY (EGD) WITH PROPOFOL N/A 10/15/2017   Procedure: ESOPHAGOGASTRODUODENOSCOPY (EGD) WITH PROPOFOL;  Surgeon: Jacqueline Sails, MD;  Location[facility] ENDOSCOPY;  Service: Endoscopy;  Laterality: N/A;   KYPHOPLASTY N/A 06/12/2017   Procedure: [id number];  Surgeon: Jacqueline Knows, MD;  Location: [facility] ORS;  Service: Orthopedics;  Laterality: N/A;   TONSILLECTOMY      Allergies  Allergen Reactions   Ciprofloxacin    Digoxin And Related    Entresto [Sacubitril-Valsartan] Other (See Comments)    Hypotensive    Erythromycin    Lopid [Gemfibrozil]    Nitrofurantoin    Spironolactone Other (See Comments)    Hospitalized for dehydration   Cefuroxime Rash   Penicillins Rash    Has patient had a PCN reaction causing immediate rash, facial/tongue/throat swelling, SOB or lightheadedness with hypotension: Unknown Has patient had a PCN reaction causing severe rash involving mucus membranes or skin necrosis: No Has patient had a PCN reaction that required hospitalization: No Has patient had a PCN reaction occurring within the last 10 years: No If all  of the above answers are "NO", then may proceed with Cephalosporin use.     Outpatient Encounter Medications as of 04/28/2022  Medication Sig   acetaminophen (TYLENOL) 325 MG tablet Take 2 tablets (650 mg total) by mouth every 4 (four) hours as needed for headache or mild pain.   apixaban (ELIQUIS) 2.5 MG TABS tablet Take 1 tablet (2.5 mg total) by mouth 2 (two) times daily. Start on 11/30/2021   atorvastatin (LIPITOR) 80 MG tablet Take 1 tablet (80 mg total) by mouth daily.   calcium elemental as carbonate (TUMS  ULTRA 1000) 400 MG chewable tablet Chew 1,000 mg by mouth 3 (three) times daily as needed for heartburn.   diclofenac Sodium (VOLTAREN) 1 % GEL Apply topically every 6 (six) hours as needed.   ergocalciferol (VITAMIN D2) 50000 units capsule Take 50,000 Units by mouth once a week.   JARDIANCE 10 MG TABS tablet Take 10 mg by mouth daily.   levothyroxine (SYNTHROID, LEVOTHROID) 25 MCG tablet Take 25 mcg by mouth daily before breakfast.   OXYGEN 4lpm via Jacqueline [name] for SOB continuous.   pantoprazole (PROTONIX) 40 MG tablet Take 40 mg by mouth in the morning and at bedtime.   senna-docusate (SENOKOT-S) 8.6-50 MG tablet Take 2 tablets by mouth daily.   sertraline (ZOLOFT) 50 MG tablet Take 50 mg by mouth daily.   torsemide (DEMADEX) 10 MG tablet Take 20 mg by mouth daily.   valsartan (DIOVAN) 80 MG tablet Take 160 mg by mouth daily.   aspirin 81 MG chewable tablet Chew 1 tablet (81 mg total) by mouth daily.   [DISCONTINUED] senna-docusate (SENOKOT-S) 8.6-50 MG tablet Take 1 tablet by mouth at bedtime as needed for mild constipation. (Patient taking differently: Take 2 tablets by mouth at bedtime as needed for mild constipation.)   No facility-administered encounter medications on file as of 04/28/2022.    Review of Systems  All other systems reviewed and are negative.   Immunization History  Administered Date(s) Administered   Influenza Inj Mdck Quad Pf 03/10/2019, 03/18/2021   Influenza Split 04/18/2014, 05/01/2015   Influenza, High Dose Seasonal PF 04/01/2022   Influenza,inj,Quad PF,6+ Mos 03/12/2020   Influenza-Unspecified 03/24/2012, 03/24/2013, 04/18/2014, 05/01/2015   PFIZER Comirnaty(Gray Top)Covid-19 Tri-Sucrose Vaccine 07/21/2019, 08/12/2019   Pfizer Covid-19 Vaccine Bivalent Booster 63yr & up 11/22/2021   Pneumococcal Polysaccharide-23 08/20/2015   Pertinent  Health Maintenance Due  Topic Date Due   FOOT EXAM  Never done   OPHTHALMOLOGY EXAM  Never done   DEXA SCAN  Never done    HEMOGLOBIN A1C  05/20/2022   INFLUENZA VACCINE  Completed      11/27/2021    9:47 PM 11/28/2021    8:15 AM 11/28/2021    7:57 PM 11/29/2021    7:59 AM 12/18/2021   12:34 PM  Fall Risk  Falls in the past year?     0  Was there an injury with Fall?     0  Fall Risk Category Calculator     0  Fall Risk Category     Low  Patient Fall Risk Level High fall risk High fall risk High fall risk High fall risk Moderate fall risk  Patient at Risk for Falls Due to     Impaired mobility;Impaired balance/gait  Fall risk Follow up     Falls evaluation completed;Education provided;Falls prevention discussed   Functional Status Survey:    Vitals:   04/28/22 0948  BP: 126/74  Pulse: 84  Resp: 18  Temp:  97.9 F (36.6 C)  SpO2: 93%  Weight: 128 lb (58.1 kg)  Height: _0  (1.448 m)   Body mass index is 27.7 kg/m. Physical Exam Constitutional:      Appearance: Normal appearance.  HENT:     Nose:     Comments: Nasal Cannula in place Cardiovascular:     Rate and Rhythm: Normal rate and regular rhythm.  Pulmonary:     Effort: Pulmonary effort is normal.     Breath sounds: Normal breath sounds.  Abdominal:     General: Abdomen is flat.     Palpations: Abdomen is soft.  Skin:    General: Skin is warm and dry.  Neurological:     Mental Status: She is alert and oriented to person, place, and time.     Labs reviewed: Recent Labs    11/19/21 0457 11/20/21 0539 11/26/21 1223 11/26/21 1436 11/27/21 2355 11/27/21 0655 11/28/21 0612 11/29/21 0653 02/10/22 0000  NA 142   < > 134*   < > 137  --  139 139 142  K 3.7   < > 6.1*   < > 4.6  --  3.9 4.2 3.9  CL 102   < > 93*   < > 100  --  104 106 100  CO2 33*   < > 30   < > 25  --  27 28 36*  GLUCOSE 115*   < > 156*   < > 117*  --  105* 120*  --   BUN 26*   < > 71*   < > 66*  --  51* 37* 27*  CREATININE 1.09*   < > 1.83*   < > 1.34*  --  0.95 0.89 1.2*  CALCIUM 8.6*   < > 8.8*   < > 8.8*  --  8.7* 8.4* 9.2  MG 2.5*  --  2.7*  --   --   2.6*  --   --   --    < > = values in this interval not displayed.   Recent Labs    11/18/21 1459 11/26/21 1223 02/10/22 0000  AST 29 34  --   ALT 24 14  --   ALKPHOS 71 51  --   BILITOT 1.1 1.3*  --   PROT 7.7 7.0  --   ALBUMIN 3.8 3.5 3.8   Recent Labs    11/18/21 0136 11/18/21 1459 11/19/21 0457 11/26/21 1223 11/27/21 0613 11/28/21 0612  WBC 4.8 11.7*   < > 8.2 7.2 6.1  NEUTROABS 2.4 8.2*  --  5.5  --   --   HGB 16.7* 18.2*   < > 16.3* 15.8* 15.1*  HCT 53.6* 58.3*   < > 51.5* 49.6* 47.5*  MCV 93.1 94.5   < > 91.0 92.9 91.9  PLT 101* 110*   < > 142* 119* 167   < > = values in this interval not displayed.   Lab Results  Component Value Date   TSH 2.925 11/18/2021   Lab Results  Component Value Date   HGBA1C 6.5 (H) 11/18/2021   Lab Results  Component Value Date   CHOL 262 (H) 11/27/2021   HDL 34 (L) 11/27/2021   LDLCALC 165 (H) 11/27/2021   TRIG 315 (H) 11/27/2021   CHOLHDL 7.7 11/27/2021    Significant Diagnostic Results in last 30 days:  No results found.  Assessment/Plan 1. Preventative health care Patient agrees to Shingrix and PCV13. Will order with pharmacy.  2. Chronic systolic CHF (congestive heart failure), NYHA class 3 ([place]) 3. MI, acute, non ST segment elevation ([place]) 4. Non-ST elevation myocardial infarction (NSTEMI), subendocardial infarction, subsequent episode of care ([place]) 5. New onset a-fib ([place]) 6. Acute ischemic stroke ([place]) 7. Dilated idiopathic cardiomyopathy ([place]) 9. Chronic respiratory failure with hypoxia [facility]) Patient with significant cardiovascular disease. Denies symptoms at this time. Appears euvolemic on exam. Will continue medications as previously prescribed - ASA 81 mg - Apixiban 2.5 mg BID - Atorvastatin 80 mg daily - Jardiance 10 mg   8. Primary hypertension BP well-controlled with Continue Valsartan 80 mg daily.   10. Gastroesophageal reflux disease without esophagitis Continue protonix 40 mg daily.   11.  Acquired hypothyroidism Continue Levothyroxine 25 mcg daily  12. CKD stage 3 due to type 2 diabetes mellitus (HCC) CKD has been stable. Continue quarterly labs.  Creatinine  Date/Time Value Ref Range Status  02/10/2022 12:00 AM 1.2 (A) 0.5 - 1.1 Final  06/15/2014 03:46 AM 0.85 0.60 - 1.30 mg/dL Final   Creatinine, Ser  Date/Time Value Ref Range Status  11/29/2021 06:53 AM 0.89 0.44 - 1.00 mg/dL Final   eGFR  Date/Time Value Ref Range Status  02/10/2022 12:00 AM 43  Final     13. Type 2 diabetes mellitus without complication, without long-term current use of insulin (HCC) Continue Jardiance 10 mg daily. A1c pending.    15. Acute upper back pain Patient with chronic back pain. Continue voltaren gel.   16. Weakness Secondary to CVA. Wheelchair dependent. Continue nursing level care.   17. Recurrent major depressive disorder, in partial remission (HCC) Mood is stable on 50 mg sertraline. Defer med reduction at this time per patient and family preference.   84. AMD (age related macular degeneration) No treatment given dry macular degeneration. Upcoming visit with ophthalmology next month.   19. Bilateral leg edema No signs of edema at this time. Continue ted hose and CTM.   20. Dependence on supplemental oxygen O2 dependent. Stable on 2LNC.   21. Hyperlipidemia, unspecified hyperlipidemia type Continue atorvastatin 80 mg daily.     Family/ staff Communication: Daughter and nursing updated.   Labs/tests ordered:  none.   Jacqueline Rand, MD, [facility] [phone]

## 2022-05-19 ENCOUNTER — Non-Acute Institutional Stay (SKILLED_NURSING_FACILITY): Payer: Medicare Other | Admitting: Student

## 2022-05-19 ENCOUNTER — Encounter: Payer: Self-pay | Admitting: Student

## 2022-05-19 DIAGNOSIS — I4811 Longstanding persistent atrial fibrillation: Secondary | ICD-10-CM

## 2022-05-19 NOTE — Progress Notes (Unsigned)
Location:  Other [place].  Nursing Home Room Number: [facility] [address] Place of Service:  SNF [phone]) Provider:  Dr. Amada Kingfisher, MD  Patient Care Team: Dewayne Shorter, MD as PCP - General (Family Medicine) Clent Jacks, RN as Registered Nurse  Extended Emergency Contact Information Primary Emergency Contact: [name] Address: [address]          [place], [place] [address] [name] of [place] Phone: [phone] Work Phone: [phone] Mobile Phone: [phone] Relation: Daughter Secondary Emergency Contact: Shirlee Limerick Home Phone: [phone] Relation: Son  Code Status:  DNR Goals of care: Advanced Directive information    05/19/2022    9:37 AM  Advanced Directives  Does Patient Have a Medical Advance Directive? Yes  Type of Advance Directive Out of facility DNR (pink MOST or yellow form)  Does patient want to make changes to medical advance directive? No - Patient declined     Chief Complaint  Patient presents with   Acute Visit    Increased Heart Rate.     HPI:  Pt is a 88 y.o. female seen today for an acute visit for concern for rapid heart rate. Patient states she doesn't know why but she suddenly felt short of breath and felt her heart racing. On investigation, nursing noted her oxygen was kinked. Patient's heart rate returned to normal range and patient's symptoms subsided.    Past Medical History:  Diagnosis Date   Acute exacerbation of CHF (congestive heart failure) ([place]) 11/18/2021   AKI (acute kidney injury) ([place]) 11/26/2021   Anemia    CHF (congestive heart failure) (HCC)    Diabetes mellitus without complication ([place]) 01/27/4817   Currently no high BS.  Has come off meds.  But having episodes of low BS now.   Dilated idiopathic cardiomyopathy (HCC)    Dizziness 02/18/2017   GERD (gastroesophageal reflux disease)    Hyperkalemia 11/26/2021   Hyperlipidemia    Hypertension    Hyponatremia 06/09/2017    Hypothyroidism    Osteoporosis    Renal disorder    stage 3   Past Surgical History:  Procedure Laterality Date   BUNIONECTOMY     COLONOSCOPY     ESOPHAGOGASTRODUODENOSCOPY (EGD) WITH PROPOFOL N/A 10/15/2017   Procedure: ESOPHAGOGASTRODUODENOSCOPY (EGD) WITH PROPOFOL;  Surgeon: Lollie Sails, MD;  Location: [facility] ENDOSCOPY;  Service: Endoscopy;  Laterality: N/A;   KYPHOPLASTY N/A 06/12/2017   Procedure: [id number];  Surgeon: Hessie Knows, MD;  Location: [facility] ORS;  Service: Orthopedics;  Laterality: N/A;   TONSILLECTOMY      Allergies  Allergen Reactions   Ciprofloxacin    Digoxin And Related    Entresto [Sacubitril-Valsartan] Other (See Comments)    Hypotensive    Erythromycin    Lopid [Gemfibrozil]    Nitrofurantoin    Spironolactone Other (See Comments)    Hospitalized for dehydration   Cefuroxime Rash   Penicillins Rash    Has patient had a PCN reaction causing immediate rash, facial/tongue/throat swelling, SOB or lightheadedness with hypotension: Unknown Has patient had a PCN reaction causing severe rash involving mucus membranes or skin necrosis: No Has patient had a PCN reaction that required hospitalization: No Has patient had a PCN reaction occurring within the last 10 years: No If all of the above answers are "NO", then may proceed with Cephalosporin use.     Outpatient Encounter Medications as of 05/19/2022  Medication Sig   acetaminophen (TYLENOL) 325 MG tablet Take 2 tablets (650 mg total)  by mouth every 4 (four) hours as needed for headache or mild pain.   apixaban (ELIQUIS) 2.5 MG TABS tablet Take 1 tablet (2.5 mg total) by mouth 2 (two) times daily. Start on 11/30/2021   atorvastatin (LIPITOR) 80 MG tablet Take 1 tablet (80 mg total) by mouth daily.   calcium elemental as carbonate (TUMS ULTRA 1000) 400 MG chewable tablet Chew 1,000 mg by mouth 3 (three) times daily as needed for heartburn.   diclofenac Sodium (VOLTAREN) 1 % GEL Apply topically  every 6 (six) hours as needed.   ergocalciferol (VITAMIN D2) 50000 units capsule Take 50,000 Units by mouth once a week.   JARDIANCE 10 MG TABS tablet Take 10 mg by mouth daily.   levothyroxine (SYNTHROID, LEVOTHROID) 25 MCG tablet Take 25 mcg by mouth daily before breakfast.   OXYGEN 4lpm via [place] for SOB continuous.   pantoprazole (PROTONIX) 40 MG tablet Take 40 mg by mouth in the morning and at bedtime.   senna-docusate (SENOKOT-S) 8.6-50 MG tablet Take 2 tablets by mouth daily.   sertraline (ZOLOFT) 50 MG tablet Take 50 mg by mouth daily.   torsemide (DEMADEX) 10 MG tablet Take 20 mg by mouth daily.   valsartan (DIOVAN) 80 MG tablet Take 160 mg by mouth daily.   [DISCONTINUED] aspirin 81 MG chewable tablet Chew 1 tablet (81 mg total) by mouth daily.   No facility-administered encounter medications on file as of 05/19/2022.    Review of Systems  All other systems reviewed and are negative.   Immunization History  Administered Date(s) Administered   Influenza Inj Mdck Quad Pf 03/10/2019, 03/18/2021   Influenza Split 04/18/2014, 05/01/2015   Influenza, High Dose Seasonal PF 04/01/2022   Influenza,inj,Quad PF,6+ Mos 03/12/2020   Influenza-Unspecified 03/24/2012, 03/24/2013, 04/18/2014, 05/01/2015   PFIZER Comirnaty(Gray Top)Covid-19 Tri-Sucrose Vaccine 07/21/2019, 08/12/2019   Pfizer Covid-19 Vaccine Bivalent Booster 32yr & up 11/22/2021   Pneumococcal Polysaccharide-23 08/20/2015, 04/29/2022   Pertinent  Health Maintenance Due  Topic Date Due   DEXA SCAN  Never done   OPHTHALMOLOGY EXAM  06/13/2022 (Originally 10/23/1942)   HEMOGLOBIN A1C  10/27/2022   FOOT EXAM  04/01/2023   INFLUENZA VACCINE  Completed      11/27/2021    9:47 PM 11/28/2021    8:15 AM 11/28/2021    7:57 PM 11/29/2021    7:59 AM 12/18/2021   12:34 PM  Fall Risk  Falls in the past year?     0  Was there an injury with Fall?     0  Fall Risk Category Calculator     0  Fall Risk Category     Low  Patient Fall  Risk Level High fall risk High fall risk High fall risk High fall risk Moderate fall risk  Patient at Risk for Falls Due to     Impaired mobility;Impaired balance/gait  Fall risk Follow up     Falls evaluation completed;Education provided;Falls prevention discussed   Functional Status Survey:    Vitals:   05/19/22 0928  BP: 120/70  Pulse: 85  Resp: 18  Temp: 97.9 F (36.6 C)  SpO2: 92%  Weight: 123 lb (55.8 kg)  Height: '4\' 9"'$  (1.448 m)   Body mass index is 26.62 kg/m. Physical Exam Constitutional:      Appearance: Normal appearance.  Cardiovascular:     Rate and Rhythm: Normal rate.     Pulses: Normal pulses.  Pulmonary:     Effort: Pulmonary effort is normal.  Abdominal:  General: Bowel sounds are normal.     Palpations: Abdomen is soft.  Skin:    General: Skin is warm and dry.  Neurological:     Mental Status: She is alert and oriented to person, place, and time.     Labs reviewed: Recent Labs    11/19/21 0457 11/20/21 0539 11/26/21 1223 11/26/21 1436 11/27/21 1950 11/27/21 0655 11/28/21 0612 11/29/21 0653 02/10/22 0000  NA 142   < > 134*   < > 137  --  139 139 142  K 3.7   < > 6.1*   < > 4.6  --  3.9 4.2 3.9  CL 102   < > 93*   < > 100  --  104 106 100  CO2 33*   < > 30   < > 25  --  27 28 36*  GLUCOSE 115*   < > 156*   < > 117*  --  105* 120*  --   BUN 26*   < > 71*   < > 66*  --  51* 37* 27*  CREATININE 1.09*   < > 1.83*   < > 1.34*  --  0.95 0.89 1.2*  CALCIUM 8.6*   < > 8.8*   < > 8.8*  --  8.7* 8.4* 9.2  MG 2.5*  --  2.7*  --   --  2.6*  --   --   --    < > = values in this interval not displayed.   Recent Labs    11/18/21 1459 11/26/21 1223 02/10/22 0000  AST 29 34  --   ALT 24 14  --   ALKPHOS 71 51  --   BILITOT 1.1 1.3*  --   PROT 7.7 7.0  --   ALBUMIN 3.8 3.5 3.8   Recent Labs    11/18/21 0136 11/18/21 1459 11/19/21 0457 11/26/21 1223 11/27/21 0613 11/28/21 0612  WBC 4.8 11.7*   < > 8.2 7.2 6.1  NEUTROABS 2.4 8.2*  --   5.5  --   --   HGB 16.7* 18.2*   < > 16.3* 15.8* 15.1*  HCT 53.6* 58.3*   < > 51.5* 49.6* 47.5*  MCV 93.1 94.5   < > 91.0 92.9 91.9  PLT 101* 110*   < > 142* 119* 167   < > = values in this interval not displayed.   Lab Results  Component Value Date   TSH 2.925 11/18/2021   Lab Results  Component Value Date   HGBA1C 6.5 04/28/2022   Lab Results  Component Value Date   CHOL 262 (H) 11/27/2021   HDL 34 (L) 11/27/2021   LDLCALC 165 (H) 11/27/2021   TRIG 315 (H) 11/27/2021   CHOLHDL 7.7 11/27/2021    Significant Diagnostic Results in last 30 days:  No results found.  Assessment/Plan 1. Longstanding persistent atrial fibrillation (HCC) Electronic monitor was inaccurately measuring patient's HR. Upon evaluation, HR is within normal rage given atrial fibrillation. Long-term use of supplemental oxygen without increase in oxygen needs. Will continue to educate nursing staff regarding proper use of oxygen for patient's safety.    Family/ staff Communication: Nursing  Labs/tests ordered:  none

## 2022-06-17 LAB — VITAMIN D 25 HYDROXY (VIT D DEFICIENCY, FRACTURES): Vit D, 25-Hydroxy: 28

## 2022-07-11 ENCOUNTER — Encounter: Payer: Self-pay | Admitting: Student

## 2022-07-11 ENCOUNTER — Non-Acute Institutional Stay (SKILLED_NURSING_FACILITY): Payer: Medicare Other | Admitting: Student

## 2022-07-11 DIAGNOSIS — I4811 Longstanding persistent atrial fibrillation: Secondary | ICD-10-CM

## 2022-07-11 DIAGNOSIS — F3341 Major depressive disorder, recurrent, in partial remission: Secondary | ICD-10-CM | POA: Diagnosis not present

## 2022-07-11 DIAGNOSIS — I5022 Chronic systolic (congestive) heart failure: Secondary | ICD-10-CM

## 2022-07-11 DIAGNOSIS — J9611 Chronic respiratory failure with hypoxia: Secondary | ICD-10-CM

## 2022-07-11 DIAGNOSIS — I420 Dilated cardiomyopathy: Secondary | ICD-10-CM

## 2022-07-11 DIAGNOSIS — N183 Chronic kidney disease, stage 3 unspecified: Secondary | ICD-10-CM

## 2022-07-11 DIAGNOSIS — K7460 Unspecified cirrhosis of liver: Secondary | ICD-10-CM

## 2022-07-11 DIAGNOSIS — E1122 Type 2 diabetes mellitus with diabetic chronic kidney disease: Secondary | ICD-10-CM

## 2022-07-11 DIAGNOSIS — D696 Thrombocytopenia, unspecified: Secondary | ICD-10-CM

## 2022-07-11 DIAGNOSIS — I700 Atherosclerosis of aorta: Secondary | ICD-10-CM | POA: Diagnosis not present

## 2022-07-11 DIAGNOSIS — C801 Malignant (primary) neoplasm, unspecified: Secondary | ICD-10-CM

## 2022-07-11 NOTE — Progress Notes (Unsigned)
Location:  Other [place].  Nursing Home Room Number: [place] Place of Service:  SNF [phone]) Provider:  Dewayne Shorter, MD  Patient Care Team: Dewayne Shorter, MD as PCP - General (Family Medicine) Clent Jacks, RN as Registered Nurse  Extended Emergency Contact Information Primary Emergency Contact: [name] Address: [address]          [place], [place] [address] [name] of [place] Phone: [phone] Work Phone: [phone] Mobile Phone: [phone] Relation: Daughter Secondary Emergency Contact: Shirlee Limerick Home Phone: [phone] Relation: Son  Code Status:  DNR Goals of care: Advanced Directive information    07/11/2022   10:03 AM  Advanced Directives  Does Patient Have a Medical Advance Directive? Yes  Type of Advance Directive Out of facility DNR (pink MOST or yellow form)  Does patient want to make changes to medical advance directive? No - Patient declined     Chief Complaint  Patient presents with  . Medical Management of Chronic Issues    Medical Management of Chronic Issues.     HPI:  Pt is a 89 y.o. female seen today for medical management of chronic diseases.     Past Medical History:  Diagnosis Date  . Acute exacerbation of CHF (congestive heart failure) ([place]) 11/18/2021  . AKI (acute kidney injury) ([place]) 11/26/2021  . Anemia   . CHF (congestive heart failure) ([place])   . Diabetes mellitus without complication ([place]) 49/82/6415   Currently no high BS.  Has come off meds.  But having episodes of low BS now.  . Dilated idiopathic cardiomyopathy ([place])   . Dizziness 02/18/2017  . GERD (gastroesophageal reflux disease)   . Hyperkalemia 11/26/2021  . Hyperlipidemia   . Hypertension   . Hyponatremia 06/09/2017  . Hypothyroidism   . Osteoporosis   . Renal disorder    stage 3   Past Surgical History:  Procedure Laterality Date  . BUNIONECTOMY    . COLONOSCOPY    . ESOPHAGOGASTRODUODENOSCOPY (EGD) WITH PROPOFOL  N/A 10/15/2017   Procedure: ESOPHAGOGASTRODUODENOSCOPY (EGD) WITH PROPOFOL;  Surgeon: Lollie Sails, MD;  Location[facility] ENDOSCOPY;  Service: Endoscopy;  Laterality: N/A;  . KYPHOPLASTY N/A 06/12/2017   Procedure: [id number];  Surgeon: Hessie Knows, MD;  Location: [facility] ORS;  Service: Orthopedics;  Laterality: N/A;  . TONSILLECTOMY      Allergies  Allergen Reactions  . Ciprofloxacin   . Digoxin And Related   . Entresto [Sacubitril-Valsartan] Other (See Comments)    Hypotensive   . Erythromycin   . Lopid [Gemfibrozil]   . Nitrofurantoin   . Spironolactone Other (See Comments)    Hospitalized for dehydration  . Cefuroxime Rash  . Penicillins Rash    Has patient had a PCN reaction causing immediate rash, facial/tongue/throat swelling, SOB or lightheadedness with hypotension: Unknown Has patient had a PCN reaction causing severe rash involving mucus membranes or skin necrosis: No Has patient had a PCN reaction that required hospitalization: No Has patient had a PCN reaction occurring within the last 10 years: No If all of the above answers are "NO", then may proceed with Cephalosporin use.     Outpatient Encounter Medications as of 07/11/2022  Medication Sig  . acetaminophen (TYLENOL) 325 MG tablet Take 2 tablets (650 mg total) by mouth every 4 (four) hours as needed for headache or mild pain.  Marland Kitchen apixaban (ELIQUIS) 2.5 MG TABS tablet Take 1 tablet (2.5 mg total) by mouth 2 (two) times daily. Start on 11/30/2021  . atorvastatin (LIPITOR)  80 MG tablet Take 1 tablet (80 mg total) by mouth daily.  . calcium elemental as carbonate (TUMS ULTRA 1000) 400 MG chewable tablet Chew 1,000 mg by mouth 3 (three) times daily as needed for heartburn.  . diclofenac Sodium (VOLTAREN) 1 % GEL Apply 2 g topically every 6 (six) hours as needed. Apply to back topically every shift for pain.  . ergocalciferol (VITAMIN D2) 50000 units capsule Take 50,000 Units by mouth once a week.  Marland Kitchen JARDIANCE 10 MG  TABS tablet Take 10 mg by mouth daily.  Marland Kitchen levothyroxine (SYNTHROID, LEVOTHROID) 25 MCG tablet Take 25 mcg by mouth daily before breakfast.  . OXYGEN 4lpm via [place] for SOB continuous.  . pantoprazole (PROTONIX) 40 MG tablet Take 40 mg by mouth in the morning and at bedtime.  . senna-docusate (SENOKOT-S) 8.6-50 MG tablet Take 2 tablets by mouth daily.  . sertraline (ZOLOFT) 50 MG tablet Take 50 mg by mouth daily.  Marland Kitchen torsemide (DEMADEX) 10 MG tablet Take 20 mg by mouth daily.  . valsartan (DIOVAN) 80 MG tablet Take 160 mg by mouth daily.   No facility-administered encounter medications on file as of 07/11/2022.    Review of Systems  Immunization History  Administered Date(s) Administered  . Influenza Inj Mdck Quad Pf 03/10/2019, 03/18/2021  . Influenza Split 04/18/2014, 05/01/2015  . Influenza, High Dose Seasonal PF 04/01/2022  . Influenza,inj,Quad PF,6+ Mos 03/12/2020  . Influenza-Unspecified 03/24/2012, 03/24/2013, 04/18/2014, 05/01/2015  . PFIZER Comirnaty(Gray Top)Covid-19 Tri-Sucrose Vaccine 07/21/2019, 08/12/2019  . Pension scheme manager 49yr & up 11/22/2021  . Pneumococcal Polysaccharide-23 08/20/2015, 04/29/2022   Pertinent  Health Maintenance Due  Topic Date Due  . OPHTHALMOLOGY EXAM  Never done  . DEXA SCAN  Never done  . HEMOGLOBIN A1C  10/27/2022  . FOOT EXAM  04/01/2023  . INFLUENZA VACCINE  Completed      11/27/2021    9:47 PM 11/28/2021    8:15 AM 11/28/2021    7:57 PM 11/29/2021    7:59 AM 12/18/2021   12:34 PM  Fall Risk  Falls in the past year?     0  Was there an injury with Fall?     0  Fall Risk Category Calculator     0  Fall Risk Category (Retired)     Low  (RETIRED) Patient Fall Risk Level High fall risk High fall risk High fall risk High fall risk Moderate fall risk  Patient at Risk for Falls Due to     Impaired mobility;Impaired balance/gait  Fall risk Follow up     Falls evaluation completed;Education provided;Falls prevention  discussed   Functional Status Survey:    Vitals:   07/11/22 0951  BP: 128/72  Pulse: 83  Resp: 20  Temp: (!) 97.4 F (36.3 C)  SpO2: 99%  Weight: 124 lb 6.4 oz (56.4 kg)  Height: '4\' 9"'$  (1.448 m)   Body mass index is 26.92 kg/m. Physical Exam  Labs reviewed: Recent Labs    11/19/21 0457 11/20/21 0539 11/26/21 1223 11/26/21 1436 11/27/21 0619506/14/23 0655 11/28/21 0612 11/29/21 0653 02/10/22 0000  NA 142   < > 134*   < > 137  --  139 139 142  K 3.7   < > 6.1*   < > 4.6  --  3.9 4.2 3.9  CL 102   < > 93*   < > 100  --  104 106 100  CO2 33*   < > 30   < >  25  --  27 28 36*  GLUCOSE 115*   < > 156*   < > 117*  --  105* 120*  --   BUN 26*   < > 71*   < > 66*  --  51* 37* 27*  CREATININE 1.09*   < > 1.83*   < > 1.34*  --  0.95 0.89 1.2*  CALCIUM 8.6*   < > 8.8*   < > 8.8*  --  8.7* 8.4* 9.2  MG 2.5*  --  2.7*  --   --  2.6*  --   --   --    < > = values in this interval not displayed.   Recent Labs    11/18/21 1459 11/26/21 1223 02/10/22 0000  AST 29 34  --   ALT 24 14  --   ALKPHOS 71 51  --   BILITOT 1.1 1.3*  --   PROT 7.7 7.0  --   ALBUMIN 3.8 3.5 3.8   Recent Labs    11/18/21 0136 11/18/21 1459 11/19/21 0457 11/26/21 1223 11/27/21 0613 11/28/21 0612  WBC 4.8 11.7*   < > 8.2 7.2 6.1  NEUTROABS 2.4 8.2*  --  5.5  --   --   HGB 16.7* 18.2*   < > 16.3* 15.8* 15.1*  HCT 53.6* 58.3*   < > 51.5* 49.6* 47.5*  MCV 93.1 94.5   < > 91.0 92.9 91.9  PLT 101* 110*   < > 142* 119* 167   < > = values in this interval not displayed.   Lab Results  Component Value Date   TSH 2.925 11/18/2021   Lab Results  Component Value Date   HGBA1C 6.5 04/28/2022   Lab Results  Component Value Date   CHOL 262 (H) 11/27/2021   HDL 34 (L) 11/27/2021   LDLCALC 165 (H) 11/27/2021   TRIG 315 (H) 11/27/2021   CHOLHDL 7.7 11/27/2021    Significant Diagnostic Results in last 30 days:  No results found.  Assessment/Plan There are no diagnoses linked to this  encounter.   Family/ staff Communication: ***  Labs/tests ordered:  ***

## 2022-07-13 DIAGNOSIS — F3341 Major depressive disorder, recurrent, in partial remission: Secondary | ICD-10-CM | POA: Insufficient documentation

## 2022-07-13 DIAGNOSIS — K7460 Unspecified cirrhosis of liver: Secondary | ICD-10-CM | POA: Insufficient documentation

## 2022-07-13 DIAGNOSIS — I700 Atherosclerosis of aorta: Secondary | ICD-10-CM | POA: Insufficient documentation

## 2022-07-17 LAB — LIPID PANEL
Cholesterol: 146 (ref 0–200)
HDL: 38 (ref 35–70)
LDL Cholesterol: 77
LDl/HDL Ratio: 3.8
Triglycerides: 226 — AB (ref 40–160)

## 2022-08-14 ENCOUNTER — Other Ambulatory Visit: Payer: Self-pay | Admitting: Nurse Practitioner

## 2022-08-14 ENCOUNTER — Non-Acute Institutional Stay (SKILLED_NURSING_FACILITY): Payer: Medicare Other | Admitting: Nurse Practitioner

## 2022-08-14 ENCOUNTER — Encounter: Payer: Self-pay | Admitting: Nurse Practitioner

## 2022-08-14 DIAGNOSIS — I4811 Longstanding persistent atrial fibrillation: Secondary | ICD-10-CM | POA: Diagnosis not present

## 2022-08-14 DIAGNOSIS — J9611 Chronic respiratory failure with hypoxia: Secondary | ICD-10-CM | POA: Diagnosis not present

## 2022-08-14 DIAGNOSIS — N183 Chronic kidney disease, stage 3 unspecified: Secondary | ICD-10-CM

## 2022-08-14 DIAGNOSIS — N1832 Chronic kidney disease, stage 3b: Secondary | ICD-10-CM

## 2022-08-14 DIAGNOSIS — K5904 Chronic idiopathic constipation: Secondary | ICD-10-CM

## 2022-08-14 DIAGNOSIS — E1122 Type 2 diabetes mellitus with diabetic chronic kidney disease: Secondary | ICD-10-CM | POA: Diagnosis not present

## 2022-08-14 DIAGNOSIS — I10 Essential (primary) hypertension: Secondary | ICD-10-CM

## 2022-08-14 DIAGNOSIS — D696 Thrombocytopenia, unspecified: Secondary | ICD-10-CM | POA: Diagnosis not present

## 2022-08-14 DIAGNOSIS — F3341 Major depressive disorder, recurrent, in partial remission: Secondary | ICD-10-CM

## 2022-08-14 DIAGNOSIS — D6869 Other thrombophilia: Secondary | ICD-10-CM

## 2022-08-14 DIAGNOSIS — I420 Dilated cardiomyopathy: Secondary | ICD-10-CM

## 2022-08-14 DIAGNOSIS — I700 Atherosclerosis of aorta: Secondary | ICD-10-CM

## 2022-08-14 NOTE — Progress Notes (Signed)
Location:  Other Nursing Home Room Number: [facility] [id number] Place of Service:  SNF [phone]) Provider:  Sherrie Mustache, NP  Dewayne Shorter, MD  Patient Care Team: Dewayne Shorter, MD as PCP - General (Family Medicine) Clent Jacks, RN as Registered Nurse  Extended Emergency Contact Information Primary Emergency Contact: [name] Address: [address]          [place], [place] [address] [name] of [place] Phone: [phone] Work Phone: [phone] Mobile Phone: [phone] Relation: Daughter Secondary Emergency Contact: Shirlee Limerick Home Phone: [phone] Relation: Son  Code Status:  DNR Goals of care: Advanced Directive information    08/14/2022   11:36 AM  Advanced Directives  Does Patient Have a Medical Advance Directive? Yes  Type of Advance Directive Out of facility DNR (pink MOST or yellow form)  Does patient want to make changes to medical advance directive? No - Patient declined     Chief Complaint  Patient presents with   Medical Management of Chronic Issues    Routine follow up visit    HPI:  Pt is a 89 y.o. female seen today for medical management of chronic diseases.   Pt with hx of CHF, chronic o2, DM, a fib. Who lives at [facility]. Her daughter visits regularly and has a lot of support from her.  She is wheelchair bound  She denies any increase in pain, shortness of breath, chest pains, LE edema.  Reports she sleeps well and mood overall is stable.  No overwhelming anxiety or depression at this time.  Staff does not have any concerns at this time.    Past Medical History:  Diagnosis Date   Acute exacerbation of CHF (congestive heart failure) ([place]) 11/18/2021   AKI (acute kidney injury) ([place]) 11/26/2021   Anemia    CHF (congestive heart failure) (HCC)    Diabetes mellitus without complication ([place]) [id number]   Currently no high BS.  Has come off meds.  But having episodes of low BS now.   Dilated idiopathic  cardiomyopathy (HCC)    Dizziness 02/18/2017   GERD (gastroesophageal reflux disease)    Hyperkalemia 11/26/2021   Hyperlipidemia    Hypertension    Hyponatremia 06/09/2017   Hypothyroidism    Osteoporosis    Renal disorder    stage 3   Past Surgical History:  Procedure Laterality Date   BUNIONECTOMY     COLONOSCOPY     ESOPHAGOGASTRODUODENOSCOPY (EGD) WITH PROPOFOL N/A 10/15/2017   Procedure: ESOPHAGOGASTRODUODENOSCOPY (EGD) WITH PROPOFOL;  Surgeon: Lollie Sails, MD;  Location: [facility] ENDOSCOPY;  Service: Endoscopy;  Laterality: N/A;   KYPHOPLASTY N/A 06/12/2017   Procedure: [id number];  Surgeon: Hessie Knows, MD;  Location: [facility] ORS;  Service: Orthopedics;  Laterality: N/A;   TONSILLECTOMY      Allergies  Allergen Reactions   Ciprofloxacin    Digoxin And Related    Entresto [Sacubitril-Valsartan] Other (See Comments)    Hypotensive    Erythromycin    Lopid [Gemfibrozil]    Nitrofurantoin    Spironolactone Other (See Comments)    Hospitalized for dehydration   Cefuroxime Rash   Penicillins Rash    Has patient had a PCN reaction causing immediate rash, facial/tongue/throat swelling, SOB or lightheadedness with hypotension: Unknown Has patient had a PCN reaction causing severe rash involving mucus membranes or skin necrosis: No Has patient had a PCN reaction that required hospitalization: No Has patient had a PCN reaction occurring within the last 10 years: No If all of the  above answers are "NO", then may proceed with Cephalosporin use.     Outpatient Encounter Medications as of 08/14/2022  Medication Sig   acetaminophen (TYLENOL) 325 MG tablet Take 2 tablets (650 mg total) by mouth every 4 (four) hours as needed for headache or mild pain.   apixaban (ELIQUIS) 2.5 MG TABS tablet Take 1 tablet (2.5 mg total) by mouth 2 (two) times daily. Start on 11/30/2021   atorvastatin (LIPITOR) 80 MG tablet Take 1 tablet (80 mg total) by mouth daily.   calcium elemental as  carbonate (TUMS ULTRA 1000) 400 MG chewable tablet Chew 1,000 mg by mouth 3 (three) times daily as needed for heartburn.   diclofenac Sodium (VOLTAREN) 1 % GEL Apply 2 g topically every 6 (six) hours as needed. Apply to back topically every shift for pain.   ergocalciferol (VITAMIN D2) 50000 units capsule Take 50,000 Units by mouth once a week.   JARDIANCE 10 MG TABS tablet Take 10 mg by mouth daily.   levothyroxine (SYNTHROID, LEVOTHROID) 25 MCG tablet Take 25 mcg by mouth daily before breakfast.   OXYGEN 4lpm via [place] for SOB continuous.   pantoprazole (PROTONIX) 40 MG tablet Take 40 mg by mouth in the morning and at bedtime.   senna-docusate (SENOKOT-S) 8.6-50 MG tablet Take 2 tablets by mouth daily.   sertraline (ZOLOFT) 50 MG tablet Take 50 mg by mouth daily.   torsemide (DEMADEX) 10 MG tablet Take 20 mg by mouth daily.   valsartan (DIOVAN) 80 MG tablet Take 160 mg by mouth daily.   No facility-administered encounter medications on file as of 08/14/2022.    Review of Systems  Constitutional:  Negative for activity change, appetite change, fatigue and unexpected weight change.  HENT:  Negative for congestion and hearing loss.   Eyes: Negative.   Respiratory:  Positive for shortness of breath. Negative for cough.   Cardiovascular:  Negative for chest pain, palpitations and leg swelling.  Gastrointestinal:  Negative for abdominal pain, constipation and diarrhea.  Genitourinary:  Negative for difficulty urinating and dysuria.  Musculoskeletal:  Negative for arthralgias and myalgias.  Skin:  Negative for color change and wound.  Neurological:  Positive for weakness. Negative for dizziness.  Psychiatric/Behavioral:  Negative for agitation, behavioral problems and confusion.     Immunization History  Administered Date(s) Administered   Influenza Inj Mdck Quad Pf 03/10/2019, 03/18/2021   Influenza Split 04/18/2014, 05/01/2015   Influenza, High Dose Seasonal PF 04/01/2022    Influenza,inj,Quad PF,6+ Mos 03/12/2020   Influenza-Unspecified 03/24/2012, 03/24/2013, 04/18/2014, 05/01/2015   PFIZER Comirnaty(Gray Top)Covid-19 Tri-Sucrose Vaccine 07/21/2019, 08/12/2019   Pfizer Covid-19 Vaccine Bivalent Booster 81yr & up 11/22/2021   Pneumococcal Polysaccharide-23 08/20/2015, 04/29/2022   Pertinent  Health Maintenance Due  Topic Date Due   DEXA SCAN  Never done   HEMOGLOBIN A1C  10/27/2022   FOOT EXAM  04/01/2023   OPHTHALMOLOGY EXAM  05/31/2023   INFLUENZA VACCINE  Completed      11/28/2021    8:15 AM 11/28/2021    7:57 PM 11/29/2021    7:59 AM 12/18/2021   12:34 PM 08/14/2022   11:35 AM  Fall Risk  Falls in the past year?    0 0  Was there an injury with Fall?    0 0  Fall Risk Category Calculator    0 0  Fall Risk Category (Retired)    Low   (RETIRED) Patient Fall Risk Level High fall risk High fall risk High fall risk Moderate fall risk  Patient at Risk for Falls Due to    Impaired mobility;Impaired balance/gait No Fall Risks  Fall risk Follow up    Falls evaluation completed;Education provided;Falls prevention discussed Falls evaluation completed   Functional Status Survey:    Vitals:   08/14/22 1129  BP: 102/62  Pulse: 80  Resp: 20  Temp: (!) 97.5 F (36.4 C)  TempSrc: Temporal  SpO2: 98%  Weight: 121 lb 12.8 oz (55.2 kg)  Height: '4\' 9"'$  (1.448 m)   Body mass index is 26.36 kg/m. Physical Exam Constitutional:      General: She is not in acute distress.    Appearance: She is well-developed. She is not diaphoretic.  HENT:     Head: Normocephalic and atraumatic.     Mouth/Throat:     Pharynx: No oropharyngeal exudate.  Eyes:     Conjunctiva/sclera: Conjunctivae normal.     Pupils: Pupils are equal, round, and reactive to light.  Cardiovascular:     Rate and Rhythm: Normal rate and regular rhythm.     Heart sounds: Normal heart sounds.  Pulmonary:     Effort: Pulmonary effort is normal.     Breath sounds: Normal breath sounds.   Abdominal:     General: Bowel sounds are normal.     Palpations: Abdomen is soft.  Musculoskeletal:     Cervical back: Normal range of motion and neck supple.     Right lower leg: No edema.     Left lower leg: No edema.  Skin:    General: Skin is warm and dry.  Neurological:     Mental Status: She is alert. Mental status is at baseline.     Motor: Weakness present.     Gait: Gait abnormal.  Psychiatric:        Mood and Affect: Mood normal.     Labs reviewed: Recent Labs    11/19/21 0457 11/20/21 0539 11/26/21 1223 11/26/21 1436 11/27/21 [id number] 11/27/21 0655 11/28/21 0612 11/29/21 0653 02/10/22 0000  NA 142   < > 134*   < > 137  --  139 139 142  K 3.7   < > 6.1*   < > 4.6  --  3.9 4.2 3.9  CL 102   < > 93*   < > 100  --  104 106 100  CO2 33*   < > 30   < > 25  --  27 28 36*  GLUCOSE 115*   < > 156*   < > 117*  --  105* 120*  --   BUN 26*   < > 71*   < > 66*  --  51* 37* 27*  CREATININE 1.09*   < > 1.83*   < > 1.34*  --  0.95 0.89 1.2*  CALCIUM 8.6*   < > 8.8*   < > 8.8*  --  8.7* 8.4* 9.2  MG 2.5*  --  2.7*  --   --  2.6*  --   --   --    < > = values in this interval not displayed.   Recent Labs    11/18/21 1459 11/26/21 1223 02/10/22 0000  AST 29 34  --   ALT 24 14  --   ALKPHOS 71 51  --   BILITOT 1.1 1.3*  --   PROT 7.7 7.0  --   ALBUMIN 3.8 3.5 3.8   Recent Labs    11/18/21 0136 11/18/21 1459 11/19/21 0457 11/26/21 1223 11/27/21 [id number] 11/28/21  0612  WBC 4.8 11.7*   < > 8.2 7.2 6.1  NEUTROABS 2.4 8.2*  --  5.5  --   --   HGB 16.7* 18.2*   < > 16.3* 15.8* 15.1*  HCT 53.6* 58.3*   < > 51.5* 49.6* 47.5*  MCV 93.1 94.5   < > 91.0 92.9 91.9  PLT 101* 110*   < > 142* 119* 167   < > = values in this interval not displayed.   Lab Results  Component Value Date   TSH 2.925 11/18/2021   Lab Results  Component Value Date   HGBA1C 6.5 04/28/2022   Lab Results  Component Value Date   CHOL 146 07/17/2022   HDL 38 07/17/2022   LDLCALC 77 07/17/2022    TRIG 226 (A) 07/17/2022   CHOLHDL 7.7 11/27/2021    Significant Diagnostic Results in last 30 days:  No results found.  Assessment/Plan 1. Longstanding persistent atrial fibrillation (HCC) -stable, follow up cbc, continues on eliquis. Rate controlled without  rate controlled medication.   2. Thrombocytopenia, unspecified ([place]) Will follow up cbc, no signs of abnormal bruising or bleeding  3. Chronic respiratory failure with hypoxia (HCC) Continues on long term O2  4. CKD stage 3 due to type 2 diabetes mellitus (HCC) Follow metabolic panel Encourage hydration Avoid nephrotoxic meds (NSAIDS)  5. Dilated idiopathic cardiomyopathy (HCC) -euvolemic, without worsening shortness of breath or chest pains.   6. Recurrent major depressive disorder, in partial remission (HCC) Stable continues on zoloft  7. Atherosclerosis of aorta ([place]) Continues on statin and eliquis.   8. Type 2 diabetes mellitus with stage 3b chronic kidney disease, without long-term current use of insulin (HCC) A1c at goal.  Encouraged dietary compliance, routine foot care/monitoring and to keep up with diabetic eye exams through ophthalmology   9. Acquired thrombophilia ([place]) -due to a fib, continues on eliquis  10. Primary hypertension -Blood pressure well controlled, goal bp <140/90 Continue current medications and dietary modifications follow metabolic panel  11. Chronic idiopathic constipation -controlled on medication and prune juice.     Carlos American. [place], [facility] [phone]

## 2022-08-18 LAB — CBC: RBC: 4.7 (ref 3.87–5.11)

## 2022-08-18 LAB — HEPATIC FUNCTION PANEL
ALT: 21 U/L (ref 7–35)
AST: 28 (ref 13–35)
Alkaline Phosphatase: 89 (ref 25–125)
Bilirubin, Total: 0.6

## 2022-08-18 LAB — BASIC METABOLIC PANEL
BUN: 29 — AB (ref 4–21)
CO2: 28 — AB (ref 13–22)
Chloride: 104 (ref 99–108)
Creatinine: 1.3 — AB (ref 0.5–1.1)
Glucose: 97
Potassium: 3.5 mEq/L (ref 3.5–5.1)
Sodium: 141 (ref 137–147)

## 2022-08-18 LAB — COMPREHENSIVE METABOLIC PANEL
Albumin: 3.7 (ref 3.5–5.0)
Calcium: 8.8 (ref 8.7–10.7)
Globulin: 2.8
eGFR: 42

## 2022-08-18 LAB — CBC AND DIFFERENTIAL
HCT: 42 (ref 36–46)
Hemoglobin: 13.6 (ref 12.0–16.0)
Neutrophils Absolute: 3746
Platelets: 124 10*3/uL — AB (ref 150–400)
WBC: 5.5

## 2022-08-25 LAB — HM DIABETES EYE EXAM

## 2022-08-26 LAB — HM DIABETES FOOT EXAM

## 2022-08-30 ENCOUNTER — Telehealth: Payer: TRICARE For Life (TFL) | Admitting: Adult Health

## 2022-08-30 MED ORDER — IPRATROPIUM-ALBUTEROL 0.5-2.5 (3) MG/3ML IN SOLN: 3.0000 mL | Freq: Three times a day (TID) | RESPIRATORY_TRACT | 0 refills | Status: DC | PRN

## 2022-08-30 NOTE — Telephone Encounter (Signed)
Nurse called to report patient is having cough and congestion for several days. Has had two covid swabs which were negative (rapid). No fever. Sats 89-90% on 4 liters chronically. She feels mildly sob with exertion, no sob at rest. Not in distress. Temp 97.1 BP 106/68.  RR 21.  Ordered Duoneb tid prn  CXR stat

## 2022-08-31 ENCOUNTER — Telehealth: Payer: TRICARE For Life (TFL) | Admitting: Family

## 2022-08-31 NOTE — Telephone Encounter (Signed)
Facility Nurse called states patient's daughter would like patient to be observed in the facility then send to ED if bleeding worsen.would like EliQuis to be held.orders given to hold EliQuis per patient's daughter's request. Please follow up.

## 2022-08-31 NOTE — Telephone Encounter (Signed)
[facility] Nurse called to report that patient having vaginal bleeding.Described as bright red blood.Patient on EliQuis.Nurse advised to send to ED for further evaluation.

## 2022-09-01 ENCOUNTER — Encounter: Payer: Self-pay | Admitting: Student

## 2022-09-01 ENCOUNTER — Non-Acute Institutional Stay (SKILLED_NURSING_FACILITY): Payer: Medicare Other | Admitting: Student

## 2022-09-01 DIAGNOSIS — R58 Hemorrhage, not elsewhere classified: Secondary | ICD-10-CM | POA: Diagnosis not present

## 2022-09-01 DIAGNOSIS — Z7901 Long term (current) use of anticoagulants: Secondary | ICD-10-CM

## 2022-09-01 LAB — CBC AND DIFFERENTIAL
HCT: 40 (ref 36–46)
Hemoglobin: 13.5 (ref 12.0–16.0)
Neutrophils Absolute: 5363
Platelets: 146 10*3/uL — AB (ref 150–400)
WBC: 7.5

## 2022-09-01 LAB — CBC: RBC: 4.54 (ref 3.87–5.11)

## 2022-09-01 NOTE — Progress Notes (Unsigned)
Location:  Other [facility]  Nursing Home Room Number: [id number] Place of Service:  SNF (31) Provider:  Dewayne Shorter, MD  Patient Care Team: Dewayne Shorter, MD as PCP - General (Family Medicine) Clent Jacks, RN as Registered Nurse  Extended Emergency Contact Information Primary Emergency Contact: [name] Address: [address]          [place], [place] [address] [name] of [place] Phone: [phone] Work Phone: [phone] Mobile Phone: [phone] Relation: Daughter Secondary Emergency Contact: Shirlee Limerick Home Phone: [phone] Relation: Son  Code Status:  DNR Goals of care: Advanced Directive information    09/01/2022    9:50 AM  Advanced Directives  Does Patient Have a Medical Advance Directive? Yes  Type of Advance Directive Out of facility DNR (pink MOST or yellow form)  Does patient want to make changes to medical advance directive? No - Patient declined     Chief Complaint  Patient presents with   Acute Visit    Bleeding.     HPI:  Pt is a 89 y.o. female seen today for an acute visit for concern for bleeding reported. Concern for vaginal bleeding. Noted on pad. First occurrence.   Patient is in her room with family friend eating breakfast feels like her normal self at this time.    Past Medical History:  Diagnosis Date   Acute exacerbation of CHF (congestive heart failure) ([place]) 11/18/2021   AKI (acute kidney injury) ([place]) 11/26/2021   Anemia    CHF (congestive heart failure) (HCC)    Diabetes mellitus without complication ([place]) XX123456   Currently no high BS.  Has come off meds.  But having episodes of low BS now.   Dilated idiopathic cardiomyopathy (HCC)    Dizziness 02/18/2017   GERD (gastroesophageal reflux disease)    Hyperkalemia 11/26/2021   Hyperlipidemia    Hypertension    Hyponatremia 06/09/2017   Hypothyroidism    Osteoporosis    Renal disorder    stage 3   Past Surgical History:  Procedure  Laterality Date   BUNIONECTOMY     COLONOSCOPY     ESOPHAGOGASTRODUODENOSCOPY (EGD) WITH PROPOFOL N/A 10/15/2017   Procedure: ESOPHAGOGASTRODUODENOSCOPY (EGD) WITH PROPOFOL;  Surgeon: Lollie Sails, MD;  Location: [facility] ENDOSCOPY;  Service: Endoscopy;  Laterality: N/A;   KYPHOPLASTY N/A 06/12/2017   Procedure: [id number];  Surgeon: Hessie Knows, MD;  Location: [facility] ORS;  Service: Orthopedics;  Laterality: N/A;   TONSILLECTOMY      Allergies  Allergen Reactions   Ciprofloxacin    Digoxin And Related    Entresto [Sacubitril-Valsartan] Other (See Comments)    Hypotensive    Erythromycin    Lopid [Gemfibrozil]    Nitrofurantoin    Spironolactone Other (See Comments)    Hospitalized for dehydration   Cefuroxime Rash   Penicillins Rash    Has patient had a PCN reaction causing immediate rash, facial/tongue/throat swelling, SOB or lightheadedness with hypotension: Unknown Has patient had a PCN reaction causing severe rash involving mucus membranes or skin necrosis: No Has patient had a PCN reaction that required hospitalization: No Has patient had a PCN reaction occurring within the last 10 years: No If all of the above answers are "NO", then may proceed with Cephalosporin use.     Outpatient Encounter Medications as of 09/01/2022  Medication Sig   acetaminophen (TYLENOL) 325 MG tablet Take 2 tablets (650 mg total) by mouth every 4 (four) hours as needed for headache or mild pain.   apixaban (  ELIQUIS) 2.5 MG TABS tablet Take 1 tablet (2.5 mg total) by mouth 2 (two) times daily. Start on 11/30/2021   atorvastatin (LIPITOR) 80 MG tablet Take 1 tablet (80 mg total) by mouth daily.   calcium elemental as carbonate (TUMS ULTRA 1000) 400 MG chewable tablet Chew 1,000 mg by mouth 3 (three) times daily as needed for heartburn.   diclofenac Sodium (VOLTAREN) 1 % GEL Apply 2 g topically every 6 (six) hours as needed. Apply to back topically every shift for pain.   ergocalciferol (VITAMIN  D2) 50000 units capsule Take 50,000 Units by mouth once a week.   ipratropium-albuterol (DUONEB) 0.5-2.5 (3) MG/3ML SOLN Take 3 mLs by nebulization 3 (three) times daily as needed.   JARDIANCE 10 MG TABS tablet Take 10 mg by mouth daily.   levothyroxine (SYNTHROID, LEVOTHROID) 25 MCG tablet Take 25 mcg by mouth daily before breakfast.   OXYGEN 4lpm via [place] for SOB continuous.   pantoprazole (PROTONIX) 40 MG tablet Take 40 mg by mouth in the morning and at bedtime.   senna-docusate (SENOKOT-S) 8.6-50 MG tablet Take 2 tablets by mouth daily.   sertraline (ZOLOFT) 50 MG tablet Take 50 mg by mouth daily.   torsemide (DEMADEX) 10 MG tablet Take 20 mg by mouth daily.   valsartan (DIOVAN) 80 MG tablet Take 160 mg by mouth daily.   No facility-administered encounter medications on file as of 09/01/2022.    Review of Systems  Immunization History  Administered Date(s) Administered   Influenza Inj Mdck Quad Pf 03/10/2019, 03/18/2021   Influenza Split 04/18/2014, 05/01/2015   Influenza, High Dose Seasonal PF 04/01/2022   Influenza,inj,Quad PF,6+ Mos 03/12/2020   Influenza-Unspecified 03/24/2012, 03/24/2013, 04/18/2014, 05/01/2015   PFIZER Comirnaty(Gray Top)Covid-19 Tri-Sucrose Vaccine 07/21/2019, 08/12/2019   Pfizer Covid-19 Vaccine Bivalent Booster 56yrs & up 11/22/2021   Pneumococcal Polysaccharide-23 08/20/2015, 04/29/2022   Pertinent  Health Maintenance Due  Topic Date Due   DEXA SCAN  Never done   HEMOGLOBIN A1C  10/27/2022   FOOT EXAM  04/01/2023   OPHTHALMOLOGY EXAM  05/31/2023   INFLUENZA VACCINE  Completed      11/28/2021    8:15 AM 11/28/2021    7:57 PM 11/29/2021    7:59 AM 12/18/2021   12:34 PM 08/14/2022   11:35 AM  Fall Risk  Falls in the past year?    0 0  Was there an injury with Fall?    0 0  Fall Risk Category Calculator    0 0  Fall Risk Category (Retired)    Low   (RETIRED) Patient Fall Risk Level High fall risk High fall risk High fall risk Moderate fall risk    Patient at Risk for Falls Due to    Impaired mobility;Impaired balance/gait No Fall Risks  Fall risk Follow up    Falls evaluation completed;Education provided;Falls prevention discussed Falls evaluation completed   Functional Status Survey:    Vitals:   09/01/22 0945  BP: 118/71  Pulse: 84  Resp: 16  Temp: (!) 97.3 F (36.3 C)  SpO2: 93%  Weight: 120 lb 12.8 oz (54.8 kg)  Height: 4\' 9"  (1.448 m)   Body mass index is 26.14 kg/m. Physical Exam Constitutional:      Comments: Chronically ill-appearing  Cardiovascular:     Rate and Rhythm: Normal rate. Rhythm irregular.     Pulses: Normal pulses.  Pulmonary:     Breath sounds: Normal breath sounds.     Comments: Wearing 2 LNC Genitourinary:  Comments: Vulva normal. Normal rectal tone. Dried blood on the labia, no obvious active bleeding Neurological:     Mental Status: She is alert and oriented to person, place, and time.     Labs reviewed: Recent Labs    11/19/21 0457 11/20/21 0539 11/26/21 1223 11/26/21 1436 11/27/21 [id number] 11/27/21 0655 11/28/21 0612 11/29/21 0653 02/10/22 0000 08/18/22 0000  NA 142   < > 134*   < > 137  --  139 139 142 141  K 3.7   < > 6.1*   < > 4.6  --  3.9 4.2 3.9 3.5  CL 102   < > 93*   < > 100  --  104 106 100 104  CO2 33*   < > 30   < > 25  --  27 28 36* 28*  GLUCOSE 115*   < > 156*   < > 117*  --  105* 120*  --   --   BUN 26*   < > 71*   < > 66*  --  51* 37* 27* 29*  CREATININE 1.09*   < > 1.83*   < > 1.34*  --  0.95 0.89 1.2* 1.3*  CALCIUM 8.6*   < > 8.8*   < > 8.8*  --  8.7* 8.4* 9.2 8.8  MG 2.5*  --  2.7*  --   --  2.6*  --   --   --   --    < > = values in this interval not displayed.   Recent Labs    11/18/21 1459 11/26/21 1223 02/10/22 0000 08/18/22 0000  AST 29 34  --  28  ALT 24 14  --  21  ALKPHOS 71 51  --  89  BILITOT 1.1 1.3*  --   --   PROT 7.7 7.0  --   --   ALBUMIN 3.8 3.5 3.8 3.7   Recent Labs    11/18/21 1459 11/19/21 0457 11/26/21 1223  11/27/21 0613 11/28/21 0612 08/18/22 0000  WBC 11.7*   < > 8.2 7.2 6.1 5.5  NEUTROABS 8.2*  --  5.5  --   --  3,746.00  HGB 18.2*   < > 16.3* 15.8* 15.1* 13.6  HCT 58.3*   < > 51.5* 49.6* 47.5* 42  MCV 94.5   < > 91.0 92.9 91.9  --   PLT 110*   < > 142* 119* 167 124*   < > = values in this interval not displayed.   Lab Results  Component Value Date   TSH 2.925 11/18/2021   Lab Results  Component Value Date   HGBA1C 6.5 04/28/2022   Lab Results  Component Value Date   CHOL 146 07/17/2022   HDL 38 07/17/2022   LDLCALC 77 07/17/2022   TRIG 226 (A) 07/17/2022   CHOLHDL 7.7 11/27/2021    Significant Diagnostic Results in last 30 days:  No results found.  Assessment/Plan Bleeding  Chronic anticoagulation Patient with acute concern for bleeding in diaper. Smear present on exam at this itme without clear source. Potential urinary vs vaginal vs stool--denies symptoms at this time. Hx of hemorroids. Will collect UA and CBC to further determine site of bleed. Hold AC. Continue GOC with daughter. F/u Wednesday 3/20   Family/ staff Communication: daughter, nursing notified  Labs/tests ordered:  UA, CBC

## 2022-09-03 ENCOUNTER — Encounter: Payer: Self-pay | Admitting: Student

## 2022-09-03 ENCOUNTER — Non-Acute Institutional Stay (SKILLED_NURSING_FACILITY): Payer: Medicare Other | Admitting: Student

## 2022-09-03 DIAGNOSIS — K5904 Chronic idiopathic constipation: Secondary | ICD-10-CM | POA: Diagnosis not present

## 2022-09-03 DIAGNOSIS — K625 Hemorrhage of anus and rectum: Secondary | ICD-10-CM | POA: Diagnosis not present

## 2022-09-03 NOTE — Progress Notes (Signed)
Location:  Other [facility].  Nursing Home Room Number: [place] of Service:  SNF [phone]) Provider:  Dewayne Shorter, MD  Patient Care Team: Dewayne Shorter, MD as PCP - General (Family Medicine) Clent Jacks, RN as Registered Nurse  Extended Emergency Contact Information Primary Emergency Contact: [name] Address: [address]          [place], [place] [address] [name] of [place] Phone: [phone] Work Phone: [phone] Mobile Phone: [phone] Relation: Daughter Secondary Emergency Contact: Shirlee Limerick Home Phone: [phone] Relation: Son  Code Status:  DNR Goals of care: Advanced Directive information    09/03/2022    1:23 PM  Advanced Directives  Does Patient Have a Medical Advance Directive? Yes  Type of Advance Directive Out of facility DNR (pink MOST or yellow form)  Does patient want to make changes to medical advance directive? No - Patient declined     Chief Complaint  Patient presents with   Acute Visit    Rectal Bleeding.     HPI:  Pt is a 89 y.o. female seen today for an acute visit for  Notfication that patient had a blood clot coming from her rectum with care earlier today.   Patient ate breakfast and participated in physical therpay today riding stationary bike  Discussed current situation with her daughter and Serina Cowper.   Past Medical History:  Diagnosis Date   Acute exacerbation of CHF (congestive heart failure) ([facility]) 11/18/2021   AKI (acute kidney injury) ([facility]) 11/26/2021   Anemia    CHF (congestive heart failure) (HCC)    Diabetes mellitus without complication ([place]) [id number]   Currently no high BS.  Has come off meds.  But having episodes of low BS now.   Dilated idiopathic cardiomyopathy (HCC)    Dizziness 02/18/2017   GERD (gastroesophageal reflux disease)    Hyperkalemia 11/26/2021   Hyperlipidemia    Hypertension    Hyponatremia 06/09/2017   Hypothyroidism    Osteoporosis    Renal  disorder    stage 3   Past Surgical History:  Procedure Laterality Date   BUNIONECTOMY     COLONOSCOPY     ESOPHAGOGASTRODUODENOSCOPY (EGD) WITH PROPOFOL N/A 10/15/2017   Procedure: ESOPHAGOGASTRODUODENOSCOPY (EGD) WITH PROPOFOL;  Surgeon: Lollie Sails, MD;  Location[facility] ENDOSCOPY;  Service: Endoscopy;  Laterality: N/A;   KYPHOPLASTY N/A 06/12/2017   Procedure: [id number];  Surgeon: Hessie Knows, MD;  Location: [facility] ORS;  Service: Orthopedics;  Laterality: N/A;   TONSILLECTOMY      Allergies  Allergen Reactions   Ciprofloxacin    Digoxin And Related    Entresto [Sacubitril-Valsartan] Other (See Comments)    Hypotensive    Erythromycin    Lopid [Gemfibrozil]    Nitrofurantoin    Spironolactone Other (See Comments)    Hospitalized for dehydration   Cefuroxime Rash   Penicillins Rash    Has patient had a PCN reaction causing immediate rash, facial/tongue/throat swelling, SOB or lightheadedness with hypotension: Unknown Has patient had a PCN reaction causing severe rash involving mucus membranes or skin necrosis: No Has patient had a PCN reaction that required hospitalization: No Has patient had a PCN reaction occurring within the last 10 years: No If all of the above answers are "NO", then may proceed with Cephalosporin use.     Outpatient Encounter Medications as of 09/03/2022  Medication Sig   acetaminophen (TYLENOL) 325 MG tablet Take 2 tablets (650 mg total) by mouth every 4 (four) hours as needed  for headache or mild pain.   apixaban (ELIQUIS) 2.5 MG TABS tablet Take 1 tablet (2.5 mg total) by mouth 2 (two) times daily. Start on 11/30/2021   atorvastatin (LIPITOR) 80 MG tablet Take 1 tablet (80 mg total) by mouth daily.   calcium elemental as carbonate (TUMS ULTRA 1000) 400 MG chewable tablet Chew 1,000 mg by mouth 3 (three) times daily as needed for heartburn.   diclofenac Sodium (VOLTAREN) 1 % GEL Apply 2 g topically every 6 (six) hours as needed. Apply to back  topically every shift for pain.   ergocalciferol (VITAMIN D2) 50000 units capsule Take 50,000 Units by mouth once a week.   ipratropium-albuterol (DUONEB) 0.5-2.5 (3) MG/3ML SOLN Take 3 mLs by nebulization 3 (three) times daily as needed.   JARDIANCE 10 MG TABS tablet Take 10 mg by mouth daily.   levothyroxine (SYNTHROID, LEVOTHROID) 25 MCG tablet Take 25 mcg by mouth daily before breakfast.   OXYGEN 4lpm via [place] for SOB continuous.   pantoprazole (PROTONIX) 40 MG tablet Take 40 mg by mouth in the morning and at bedtime.   senna-docusate (SENOKOT-S) 8.6-50 MG tablet Take 2 tablets by mouth daily.   sertraline (ZOLOFT) 50 MG tablet Take 50 mg by mouth daily.   torsemide (DEMADEX) 10 MG tablet Take 20 mg by mouth daily.   valsartan (DIOVAN) 80 MG tablet Take 160 mg by mouth daily.   No facility-administered encounter medications on file as of 09/03/2022.    Review of Systems  Immunization History  Administered Date(s) Administered   Influenza Inj Mdck Quad Pf 03/10/2019, 03/18/2021   Influenza Split 04/18/2014, 05/01/2015   Influenza, High Dose Seasonal PF 04/01/2022   Influenza,inj,Quad PF,6+ Mos 03/12/2020   Influenza-Unspecified 03/24/2012, 03/24/2013, 04/18/2014, 05/01/2015   PFIZER Comirnaty(Gray Top)Covid-19 Tri-Sucrose Vaccine 07/21/2019, 08/12/2019   Pfizer Covid-19 Vaccine Bivalent Booster 68yrs & up 11/22/2021   Pneumococcal Polysaccharide-23 08/20/2015, 04/29/2022   Pertinent  Health Maintenance Due  Topic Date Due   DEXA SCAN  Never done   HEMOGLOBIN A1C  10/27/2022   OPHTHALMOLOGY EXAM  05/31/2023   FOOT EXAM  08/26/2023   INFLUENZA VACCINE  Completed      11/28/2021    8:15 AM 11/28/2021    7:57 PM 11/29/2021    7:59 AM 12/18/2021   12:34 PM 08/14/2022   11:35 AM  Fall Risk  Falls in the past year?    0 0  Was there an injury with Fall?    0 0  Fall Risk Category Calculator    0 0  Fall Risk Category (Retired)    Low   (RETIRED) Patient Fall Risk Level High fall  risk High fall risk High fall risk Moderate fall risk   Patient at Risk for Falls Due to    Impaired mobility;Impaired balance/gait No Fall Risks  Fall risk Follow up    Falls evaluation completed;Education provided;Falls prevention discussed Falls evaluation completed   Functional Status Survey:    Vitals:   09/03/22 1316 09/03/22 1324  BP: (!) 156/84 (!) 143/82  Pulse: 84   Resp: 16   Temp: (!) 97.3 F (36.3 C)   SpO2: 93%   Weight: 120 lb 6.4 oz (54.6 kg)   Height: 4\' 9"  (1.448 m)    Body mass index is 26.05 kg/m. Physical Exam Constitutional:      Comments: Chronically ill-appearing. Wearing [place].   Genitourinary:    General: Normal vulva.     Rectum: Normal.     Comments: No  palpable hemorrhoids on exam or visible blood on rectal exam. Neurological:     Mental Status: She is alert and oriented to person, place, and time. Mental status is at baseline.     Labs reviewed: Recent Labs    11/19/21 0457 11/20/21 0539 11/26/21 1223 11/26/21 1436 11/27/21 [id number] 11/27/21 0655 11/28/21 0612 11/29/21 0653 02/10/22 0000 08/18/22 0000  NA 142   < > 134*   < > 137  --  139 139 142 141  K 3.7   < > 6.1*   < > 4.6  --  3.9 4.2 3.9 3.5  CL 102   < > 93*   < > 100  --  104 106 100 104  CO2 33*   < > 30   < > 25  --  27 28 36* 28*  GLUCOSE 115*   < > 156*   < > 117*  --  105* 120*  --   --   BUN 26*   < > 71*   < > 66*  --  51* 37* 27* 29*  CREATININE 1.09*   < > 1.83*   < > 1.34*  --  0.95 0.89 1.2* 1.3*  CALCIUM 8.6*   < > 8.8*   < > 8.8*  --  8.7* 8.4* 9.2 8.8  MG 2.5*  --  2.7*  --   --  2.6*  --   --   --   --    < > = values in this interval not displayed.   Recent Labs    11/18/21 1459 11/26/21 1223 02/10/22 0000 08/18/22 0000  AST 29 34  --  28  ALT 24 14  --  21  ALKPHOS 71 51  --  89  BILITOT 1.1 1.3*  --   --   PROT 7.7 7.0  --   --   ALBUMIN 3.8 3.5 3.8 3.7   Recent Labs    11/26/21 1223 11/27/21 0613 11/28/21 0612 08/18/22 0000 09/01/22 0000   WBC 8.2 7.2 6.1 5.5 7.5  NEUTROABS 5.5  --   --  3,746.00 5,363.00  HGB 16.3* 15.8* 15.1* 13.6 13.5  HCT 51.5* 49.6* 47.5* 42 40  MCV 91.0 92.9 91.9  --   --   PLT 142* 119* 167 124* 146*   Lab Results  Component Value Date   TSH 2.925 11/18/2021   Lab Results  Component Value Date   HGBA1C 6.5 04/28/2022   Lab Results  Component Value Date   CHOL 146 07/17/2022   HDL 38 07/17/2022   LDLCALC 77 07/17/2022   TRIG 226 (A) 07/17/2022   CHOLHDL 7.7 11/27/2021    Significant Diagnostic Results in last 30 days:  No results found.  Assessment/Plan Rectal bleeding  Chronic idiopathic constipation Patient with acute bleeding - likely rectal. No blood noted on exam, only reported by RN. No vaginal bleeding. CBC stable today. UA still pending. Discussed possibility of various locations for bleeding in GU area - UTI vs vaginal bleeding (which could be cervical vs uterine), rectal (colon vs rectal vs hemorrhoids). Discussed possibility of cancer for all locations. Discussed most likely cause given history is hemorrhoids. HCPOA okay with potential interventions for hemorrhoids, however, if there are other sources for bleeding would defer interventions and offer supportive care. Plan to continue Anticoagulation in the setting of numerus chronic disease and afib- risk of bleeding is less concerning than a recurrent stroke. Plan to repeat CBC in 1 week. If stable,  will monitor with periodic checks of hemoglobin, if abnormal, will plan for GI consultation.    Family/ staff Communication: HCPOA, Nursing  Labs/tests ordered:  CBC in 1 week.    I spent >16 minutes discussing goals of care. I spent 30 minutes in face to face time, coordination of care, chart review, patient education for the care of this patient.

## 2022-09-11 LAB — CBC AND DIFFERENTIAL
HCT: 41 (ref 36–46)
Hemoglobin: 13.5 (ref 12.0–16.0)
Platelets: 147 10*3/uL — AB (ref 150–400)
WBC: 6.3

## 2022-09-11 LAB — CBC: RBC: 4.6 (ref 3.87–5.11)

## 2022-09-22 ENCOUNTER — Encounter: Payer: Self-pay | Admitting: Student

## 2022-09-22 ENCOUNTER — Non-Acute Institutional Stay (SKILLED_NURSING_FACILITY): Payer: Medicare Other | Admitting: Student

## 2022-09-22 DIAGNOSIS — I4811 Longstanding persistent atrial fibrillation: Secondary | ICD-10-CM

## 2022-09-22 DIAGNOSIS — J9611 Chronic respiratory failure with hypoxia: Secondary | ICD-10-CM

## 2022-09-22 DIAGNOSIS — E1122 Type 2 diabetes mellitus with diabetic chronic kidney disease: Secondary | ICD-10-CM

## 2022-09-22 DIAGNOSIS — E039 Hypothyroidism, unspecified: Secondary | ICD-10-CM

## 2022-09-22 DIAGNOSIS — R600 Localized edema: Secondary | ICD-10-CM

## 2022-09-22 DIAGNOSIS — K5904 Chronic idiopathic constipation: Secondary | ICD-10-CM

## 2022-09-22 DIAGNOSIS — I420 Dilated cardiomyopathy: Secondary | ICD-10-CM

## 2022-09-22 DIAGNOSIS — I10 Essential (primary) hypertension: Secondary | ICD-10-CM

## 2022-09-22 DIAGNOSIS — N1832 Chronic kidney disease, stage 3b: Secondary | ICD-10-CM

## 2022-09-22 DIAGNOSIS — K219 Gastro-esophageal reflux disease without esophagitis: Secondary | ICD-10-CM

## 2022-09-22 DIAGNOSIS — K625 Hemorrhage of anus and rectum: Secondary | ICD-10-CM

## 2022-09-22 NOTE — Progress Notes (Signed)
Location:  Other [facility].  Nursing Home Room Number: [facility] [address] Place of Service:  SNF [phone]) Provider:  Earnestine Mealing, MD  Patient Care Team: Earnestine Mealing, MD as PCP - General (Family Medicine) Benita Gutter, RN as Registered Nurse  Extended Emergency Contact Information Primary Emergency Contact: [name] Address: [address]          [place], [place] [address] [name] of [place] Home Phone: [phone] Work Phone: [phone] Mobile Phone: [phone] Relation: Daughter Secondary Emergency Contact: Raina Mina Home Phone: [phone] Relation: Son  Code Status:  DNR Goals of care: Advanced Directive information    09/22/2022   10:41 AM  Advanced Directives  Does Patient Have a Medical Advance Directive? Yes  Type of Advance Directive Out of facility DNR (pink MOST or yellow form)  Does patient want to make changes to medical advance directive? No - Patient declined     Chief Complaint  Patient presents with   Medical Management of Chronic Issues    Medical Management of Chronic Issues.     HPI:  Pt is a 89 y.o. female seen today for medical management of chronic diseases.    Patient states she is doing well. She has no concerns at this time. Daughter is often present, however, comes at lunch time and is not currently at bedside. She is wheelchair dependent, however, can ambulate to the bathroom in her room with a walker. Continues to have restorative therapy to ambulate daily. Denies increased pain or shortness of breath.  Nursing denies continued concerns of bleeding at this time, labs stable.    Past Medical History:  Diagnosis Date   Acute exacerbation of CHF (congestive heart failure) 11/18/2021   AKI (acute kidney injury) 11/26/2021   Anemia    CHF (congestive heart failure)    Diabetes mellitus without complication 03/13/2017   Currently no high BS.  Has come off meds.  But having episodes of low BS now.   Dilated  idiopathic cardiomyopathy    Dizziness 02/18/2017   GERD (gastroesophageal reflux disease)    Hyperkalemia 11/26/2021   Hyperlipidemia    Hypertension    Hyponatremia 06/09/2017   Hypothyroidism    Osteoporosis    Renal disorder    stage 3   Past Surgical History:  Procedure Laterality Date   BUNIONECTOMY     COLONOSCOPY     ESOPHAGOGASTRODUODENOSCOPY (EGD) WITH PROPOFOL N/A 10/15/2017   Procedure: ESOPHAGOGASTRODUODENOSCOPY (EGD) WITH PROPOFOL;  Surgeon: Christena Deem, MD;  Location: [facility] ENDOSCOPY;  Service: Endoscopy;  Laterality: N/A;   KYPHOPLASTY N/A 06/12/2017   Procedure: [id number];  Surgeon: Kennedy Bucker, MD;  Location: [facility] ORS;  Service: Orthopedics;  Laterality: N/A;   TONSILLECTOMY      Allergies  Allergen Reactions   Ciprofloxacin    Digoxin And Related    Entresto [Sacubitril-Valsartan] Other (See Comments)    Hypotensive    Erythromycin    Lopid [Gemfibrozil]    Nitrofurantoin    Spironolactone Other (See Comments)    Hospitalized for dehydration   Cefuroxime Rash   Penicillins Rash    Has patient had a PCN reaction causing immediate rash, facial/tongue/throat swelling, SOB or lightheadedness with hypotension: Unknown Has patient had a PCN reaction causing severe rash involving mucus membranes or skin necrosis: No Has patient had a PCN reaction that required hospitalization: No Has patient had a PCN reaction occurring within the last 10 years: No If all of the above answers are "NO", then may proceed with  Cephalosporin use.     Outpatient Encounter Medications as of 09/22/2022  Medication Sig   acetaminophen (TYLENOL) 325 MG tablet Take 2 tablets (650 mg total) by mouth every 4 (four) hours as needed for headache or mild pain.   apixaban (ELIQUIS) 2.5 MG TABS tablet Take 1 tablet (2.5 mg total) by mouth 2 (two) times daily. Start on 11/30/2021   atorvastatin (LIPITOR) 80 MG tablet Take 1 tablet (80 mg total) by mouth daily.   calcium elemental  as carbonate (TUMS ULTRA 1000) 400 MG chewable tablet Chew 1,000 mg by mouth 3 (three) times daily as needed for heartburn.   diclofenac Sodium (VOLTAREN) 1 % GEL Apply 2 g topically every 6 (six) hours as needed. Apply to back topically every shift for pain.   ergocalciferol (VITAMIN D2) 50000 units capsule Take 50,000 Units by mouth once a week.   ipratropium-albuterol (DUONEB) 0.5-2.5 (3) MG/3ML SOLN Take 3 mLs by nebulization 3 (three) times daily as needed.   JARDIANCE 10 MG TABS tablet Take 10 mg by mouth daily.   levothyroxine (SYNTHROID, LEVOTHROID) 25 MCG tablet Take 25 mcg by mouth daily before breakfast.   OXYGEN 4lpm via [place] for SOB continuous.   pantoprazole (PROTONIX) 40 MG tablet Take 40 mg by mouth in the morning and at bedtime.   senna-docusate (SENOKOT-S) 8.6-50 MG tablet Take 2 tablets by mouth daily.   sertraline (ZOLOFT) 50 MG tablet Take 50 mg by mouth daily.   torsemide (DEMADEX) 10 MG tablet Take 20 mg by mouth daily.   valsartan (DIOVAN) 80 MG tablet Take 160 mg by mouth daily.   No facility-administered encounter medications on file as of 09/22/2022.    Review of Systems  Immunization History  Administered Date(s) Administered   Influenza Inj Mdck Quad Pf 03/10/2019, 03/18/2021   Influenza Split 04/18/2014, 05/01/2015   Influenza, High Dose Seasonal PF 04/01/2022   Influenza,inj,Quad PF,6+ Mos 03/12/2020   Influenza-Unspecified 03/24/2012, 03/24/2013, 04/18/2014, 05/01/2015   PFIZER Comirnaty(Gray Top)Covid-19 Tri-Sucrose Vaccine 07/21/2019, 08/12/2019   Pfizer Covid-19 Vaccine Bivalent Booster 27yrs & up 11/22/2021   Pneumococcal Polysaccharide-23 08/20/2015, 04/29/2022   Pertinent  Health Maintenance Due  Topic Date Due   DEXA SCAN  Never done   HEMOGLOBIN A1C  10/27/2022   INFLUENZA VACCINE  01/15/2023   OPHTHALMOLOGY EXAM  08/25/2023   FOOT EXAM  08/26/2023      11/28/2021    8:15 AM 11/28/2021    7:57 PM 11/29/2021    7:59 AM 12/18/2021   12:34 PM  08/14/2022   11:35 AM  Fall Risk  Falls in the past year?    0 0  Was there an injury with Fall?    0 0  Fall Risk Category Calculator    0 0  Fall Risk Category (Retired)    Low   (RETIRED) Patient Fall Risk Level High fall risk High fall risk High fall risk Moderate fall risk   Patient at Risk for Falls Due to    Impaired mobility;Impaired balance/gait No Fall Risks  Fall risk Follow up    Falls evaluation completed;Education provided;Falls prevention discussed Falls evaluation completed   Functional Status Survey:    Vitals:   09/22/22 1036  BP: 135/61  Pulse: 79  Resp: 18  Temp: (!) 97.5 F (36.4 C)  SpO2: 93%  Weight: 121 lb (54.9 kg)  Height: 4\' 9"  (1.448 m)   Body mass index is 26.18 kg/m. Physical Exam Cardiovascular:     Rate and Rhythm: Normal  rate.     Pulses: Normal pulses.  Pulmonary:     Effort: Pulmonary effort is normal.     Breath sounds: Normal breath sounds.     Comments: 2LNC Neurological:     Mental Status: She is alert and oriented to person, place, and time.     Labs reviewed: Recent Labs    11/19/21 0457 11/20/21 0539 11/26/21 1223 11/26/21 1436 11/27/21 4098 11/27/21 0655 11/28/21 0612 11/29/21 0653 02/10/22 0000 08/18/22 0000  NA 142   < > 134*   < > 137  --  139 139 142 141  K 3.7   < > 6.1*   < > 4.6  --  3.9 4.2 3.9 3.5  CL 102   < > 93*   < > 100  --  104 106 100 104  CO2 33*   < > 30   < > 25  --  27 28 36* 28*  GLUCOSE 115*   < > 156*   < > 117*  --  105* 120*  --   --   BUN 26*   < > 71*   < > 66*  --  51* 37* 27* 29*  CREATININE 1.09*   < > 1.83*   < > 1.34*  --  0.95 0.89 1.2* 1.3*  CALCIUM 8.6*   < > 8.8*   < > 8.8*  --  8.7* 8.4* 9.2 8.8  MG 2.5*  --  2.7*  --   --  2.6*  --   --   --   --    < > = values in this interval not displayed.   Recent Labs    11/18/21 1459 11/26/21 1223 02/10/22 0000 08/18/22 0000  AST 29 34  --  28  ALT 24 14  --  21  ALKPHOS 71 51  --  89  BILITOT 1.1 1.3*  --   --   PROT 7.7  7.0  --   --   ALBUMIN 3.8 3.5 3.8 3.7   Recent Labs    11/26/21 1223 11/27/21 0613 11/28/21 0612 08/18/22 0000 09/01/22 0000 09/11/22 0000  WBC 8.2 7.2 6.1 5.5 7.5 6.3  NEUTROABS 5.5  --   --  3,746.00 5,363.00  --   HGB 16.3* 15.8* 15.1* 13.6 13.5 13.5  HCT 51.5* 49.6* 47.5* 42 40 41  MCV 91.0 92.9 91.9  --   --   --   PLT 142* 119* 167 124* 146* 147*   Lab Results  Component Value Date   TSH 2.925 11/18/2021   Lab Results  Component Value Date   HGBA1C 6.5 04/28/2022   Lab Results  Component Value Date   CHOL 146 07/17/2022   HDL 38 07/17/2022   LDLCALC 77 07/17/2022   TRIG 226 (A) 07/17/2022   CHOLHDL 7.7 11/27/2021    Significant Diagnostic Results in last 30 days:  No results found.  Assessment/Plan 1. Chronic idiopathic constipation 2. Rectal bleeding Improved w/o interventions. Stable hgb, defer interventions or investigation at this time. GOC are to medically manage and forego invasive interventions or procedures when possible. If recurrent, will consider GI referral for sigmoidoscopy.   3. Longstanding persistent atrial fibrillation (HCC) Rate well-controlled. Continue apixiban 2.5 mg BID.   4. Chronic respiratory failure with hypoxia (HCC) Remains on baseline oxygen 4LNC. Continue to monitor. DNR in place.   5. Dilated idiopathic cardiomyopathy (HCC) Appears euvolemic at this time with clear lungs and stable leg edema. Continue demedex  20 mg daily.   6. Type 2 diabetes mellitus with stage 3b chronic kidney disease, without long-term current use of insulin (HCC) DM well-controlled. Continue q36mo A1c checks. Continue jardiance 10 mg daily and ARB.   7. Primary hypertension BP well-controlled on valsartan 80 mg daily.   8. Acquired hypothyroidism Annual TSH, continue levothyroxine 25 mcg. Next level due in May 2024.   9. Gastroesophageal reflux disease without esophagitis Symptoms well-controlled  10. Bilateral leg edema Stable, continue  compression as tolerated.    Family/ staff Communication: nursing  Labs/tests ordered:  none

## 2022-10-23 ENCOUNTER — Non-Acute Institutional Stay (INDEPENDENT_AMBULATORY_CARE_PROVIDER_SITE_OTHER): Payer: Medicare Other | Admitting: Nurse Practitioner

## 2022-10-23 ENCOUNTER — Encounter: Payer: Self-pay | Admitting: Nurse Practitioner

## 2022-10-23 DIAGNOSIS — Z0000 Encounter for general adult medical examination without abnormal findings: Secondary | ICD-10-CM

## 2022-10-23 NOTE — Progress Notes (Signed)
Subjective:   Jacqueline [name] is a 89 y.o. female who presents for Medicare Annual (Subsequent) preventive examination.  Review of Systems     Cardiac Risk Factors include: advanced age (>57men, >17 women);sedentary lifestyle     Objective:    Today's Vitals   10/23/22 1459  BP: 116/72  Pulse: 79  Resp: 18  Temp: (!) 97.2 F (36.2 C)  SpO2: 95%  Weight: 121 lb 12.8 oz (55.2 kg)  Height: 4\' 9"  (1.448 m)   Body mass index is 26.36 kg/m.     10/23/2022    3:06 PM 09/22/2022   10:41 AM 09/03/2022    1:23 PM 09/01/2022    9:50 AM 08/14/2022   11:36 AM 07/11/2022   10:03 AM 05/19/2022    9:37 AM  Advanced Directives  Does Patient Have a Medical Advance Directive? Yes Yes Yes Yes Yes Yes Yes  Type of Advance Directive Out of facility DNR (pink MOST or yellow form) Out of facility DNR (pink MOST or yellow form) Out of facility DNR (pink MOST or yellow form) Out of facility DNR (pink MOST or yellow form) Out of facility DNR (pink MOST or yellow form) Out of facility DNR (pink MOST or yellow form) Out of facility DNR (pink MOST or yellow form)  Does patient want to make changes to medical advance directive? No - Patient declined No - Patient declined No - Patient declined No - Patient declined No - Patient declined No - Patient declined No - Patient declined    Current Medications (verified) Outpatient Encounter Medications as of 10/23/2022  Medication Sig   acetaminophen (TYLENOL) 325 MG tablet Take 2 tablets (650 mg total) by mouth every 4 (four) hours as needed for headache or mild pain.   apixaban (ELIQUIS) 2.5 MG TABS tablet Take 1 tablet (2.5 mg total) by mouth 2 (two) times daily. Start on 11/30/2021   atorvastatin (LIPITOR) 80 MG tablet Take 1 tablet (80 mg total) by mouth daily.   calcium elemental as carbonate (TUMS ULTRA 1000) 400 MG chewable tablet Chew 1,000 mg by mouth 3 (three) times daily as needed for heartburn.   diclofenac Sodium (VOLTAREN) 1 % GEL Apply 2 g topically  every 6 (six) hours as needed. Apply to back topically every shift for pain.   ergocalciferol (VITAMIN D2) 50000 units capsule Take 50,000 Units by mouth once a week.   ipratropium-albuterol (DUONEB) 0.5-2.5 (3) MG/3ML SOLN Take 3 mLs by nebulization 3 (three) times daily as needed.   JARDIANCE 10 MG TABS tablet Take 10 mg by mouth daily.   levothyroxine (SYNTHROID, LEVOTHROID) 25 MCG tablet Take 25 mcg by mouth daily before breakfast.   OXYGEN 4lpm via [place] for SOB continuous.   pantoprazole (PROTONIX) 40 MG tablet Take 40 mg by mouth in the morning and at bedtime.   senna-docusate (SENOKOT-S) 8.6-50 MG tablet Take 2 tablets by mouth daily.   sertraline (ZOLOFT) 50 MG tablet Take 50 mg by mouth daily.   torsemide (DEMADEX) 10 MG tablet Take 20 mg by mouth daily.   valsartan (DIOVAN) 80 MG tablet Take 160 mg by mouth daily.   No facility-administered encounter medications on file as of 10/23/2022.    Allergies (verified) Ciprofloxacin, Digoxin and related, Entresto [sacubitril-valsartan], Erythromycin, Lopid [gemfibrozil], Nitrofurantoin, Spironolactone, Cefuroxime, and Penicillins   History: Past Medical History:  Diagnosis Date   Acute exacerbation of CHF (congestive heart failure) (HCC) 11/18/2021   AKI (acute kidney injury) (HCC) 11/26/2021   Anemia  CHF (congestive heart failure) (HCC)    Diabetes mellitus without complication (HCC) 03/13/2017   Currently no high BS.  Has come off meds.  But having episodes of low BS now.   Dilated idiopathic cardiomyopathy (HCC)    Dizziness 02/18/2017   GERD (gastroesophageal reflux disease)    Hyperkalemia 11/26/2021   Hyperlipidemia    Hypertension    Hyponatremia 06/09/2017   Hypothyroidism    Osteoporosis    Renal disorder    stage 3   Past Surgical History:  Procedure Laterality Date   BUNIONECTOMY     COLONOSCOPY     ESOPHAGOGASTRODUODENOSCOPY (EGD) WITH PROPOFOL N/A 10/15/2017   Procedure: ESOPHAGOGASTRODUODENOSCOPY (EGD) WITH  PROPOFOL;  Surgeon: Christena Deem, MD;  Location: [facility] ENDOSCOPY;  Service: Endoscopy;  Laterality: N/A;   KYPHOPLASTY N/A 06/12/2017   Procedure: [id number];  Surgeon: Kennedy Bucker, MD;  Location: [facility] ORS;  Service: Orthopedics;  Laterality: N/A;   TONSILLECTOMY     Family History  Problem Relation Age of Onset   Premature CHD Brother    Social History   Socioeconomic History   Marital status: Widowed    Spouse name: Not on file   Number of children: Not on file   Years of education: Not on file   Highest education level: Not on file  Occupational History   Not on file  Tobacco Use   Smoking status: Former    Types: Cigarettes    Quit date: 04/18/1984    Years since quitting: 38.5   Smokeless tobacco: Never  Vaping Use   Vaping Use: Former   Start date: 05/16/1965   Quit date: 05/16/1989  Substance and Sexual Activity   Alcohol use: No   Drug use: No   Sexual activity: Never  Other Topics Concern   Not on file  Social History Narrative   Not on file   Social Determinants of Health   Financial Resource Strain: Not on file  Food Insecurity: Not on file  Transportation Needs: Not on file  Physical Activity: Not on file  Stress: Not on file  Social Connections: Not on file    Tobacco Counseling Counseling given: Not Answered   Clinical Intake:  Pre-visit preparation completed: Yes  Pain : No/denies pain     Diabetes: No  How often do you need to have someone help you when you read instructions, pamphlets, or other written materials from your doctor or pharmacy?: 4 - Often  Diabetic?yes         Activities of Daily Living    10/23/2022    2:25 PM 12/18/2021   12:35 PM  In your present state of health, do you have any difficulty performing the following activities:  Hearing? 0 1  Vision? 0 1  Difficulty concentrating or making decisions? 0 0  Walking or climbing stairs? 1 1  Dressing or bathing? 1 1  Doing errands, shopping? 1 1   Preparing Food and eating ? Y   Using the Toilet? Y   In the past six months, have you accidently leaked urine? N   Do you have problems with loss of bowel control? N   Managing your Medications? Y   Managing your Finances? Y   Housekeeping or managing your Housekeeping? Y     Patient Care Team: Earnestine Mealing, MD as PCP - General (Family Medicine) Benita Gutter, RN as Registered Nurse  Indicate any recent Medical Services you may have received from other than [facility] providers in the past year (  date may be approximate).     Assessment:   This is a routine wellness examination for [employer].  Hearing/Vision screen No results found.  Dietary issues and exercise activities discussed: Current Exercise Habits: Home exercise routine, Type of exercise: Other - see comments (bike), Time (Minutes): 15, Frequency (Times/Week): 5, Weekly Exercise (Minutes/Week): 75   Goals Addressed   None    Depression Screen    12/18/2021   12:34 PM  PHQ 2/9 Scores  PHQ - 2 Score 0    Fall Risk    10/23/2022    2:27 PM 08/14/2022   11:35 AM 12/18/2021   12:34 PM  Fall Risk   Falls in the past year? 0 0 0  Number falls in past yr:  0 0  Injury with Fall?  0 0  Risk for fall due to :  No Fall Risks Impaired mobility;Impaired balance/gait  Follow up  Falls evaluation completed Falls evaluation completed;Education provided;Falls prevention discussed    FALL RISK PREVENTION PERTAINING TO THE HOME:  Any stairs in or around the home? No  If so, are there any without handrails? na Home free of loose throw rugs in walkways, pet beds, electrical cords, etc? Yes  Adequate lighting in your home to reduce risk of falls? Yes   ASSISTIVE DEVICES UTILIZED TO PREVENT FALLS:  Life alert? no Use of a cane, walker or w/c? Yes  Grab bars in the bathroom? Yes  Shower chair or bench in shower? Yes  Elevated toilet seat or a handicapped toilet? Yes   TIMED UP AND GO:  Was the test performed? No .     Cognitive Function:        Immunizations Immunization History  Administered Date(s) Administered   Influenza Inj Mdck Quad Pf 03/10/2019, 03/18/2021   Influenza Split 04/18/2014, 05/01/2015   Influenza, High Dose Seasonal PF 04/01/2022   Influenza,inj,Quad PF,6+ Mos 03/12/2020   Influenza-Unspecified 03/24/2012, 03/24/2013, 04/18/2014, 05/01/2015   PFIZER Comirnaty(Gray Top)Covid-19 Tri-Sucrose Vaccine 07/21/2019, 08/12/2019   Pfizer Covid-19 Vaccine Bivalent Booster 69yrs & up 11/22/2021   Pneumococcal Polysaccharide-23 08/20/2015, 04/29/2022    TDAP status: Due, Education has been provided regarding the importance of this vaccine. Advised may receive this vaccine at local pharmacy or Health Dept. Aware to provide a copy of the vaccination record if obtained from local pharmacy or Health Dept. Verbalized acceptance and understanding.  Flu Vaccine status: Up to date  Pneumococcal vaccine status: Up to date  Covid-19 vaccine status: Information provided on how to obtain vaccines.   Qualifies for Shingles Vaccine? Yes   Zostavax completed No   Shingrix Completed?: No.    Education has been provided regarding the importance of this vaccine. Patient has been advised to call insurance company to determine out of pocket expense if they have not yet received this vaccine. Advised may also receive vaccine at local pharmacy or Health Dept. Verbalized acceptance and understanding.  Screening Tests Health Maintenance  Topic Date Due   DEXA SCAN  Never done   Zoster Vaccines- Shingrix (1 of 2) 10/23/2023 (Originally 10/23/1951)   COVID-19 Vaccine (4 - 2023-24 season) 11/11/2024 (Originally 02/14/2022)   HEMOGLOBIN A1C  10/27/2022   INFLUENZA VACCINE  01/15/2023   Pneumonia Vaccine 3+ Years old (2 of 2 - PCV) 04/30/2023   OPHTHALMOLOGY EXAM  08/25/2023   FOOT EXAM  08/26/2023   Medicare Annual Wellness (AWV)  10/23/2023   HPV VACCINES  Aged Out   DTaP/Tdap/Td  Discontinued     Health Maintenance  Health Maintenance Due  Topic Date Due   DEXA SCAN  Never done    Colorectal cancer screening: No longer required.   Mammogram status: No longer required due to age.  Bone Density status: Ordered today. Pt provided with contact info and advised to call to schedule appt.  Lung Cancer Screening: (Low Dose CT Chest recommended if Age 52-80 years, 30 pack-year currently smoking OR have quit w/in 15years.) does notdoes not qualify.   Lung Cancer Screening Referral: na  Additional Screening:  Hepatitis C Screening: does not qualify; Completed  Vision Screening: Recommended annual ophthalmology exams for early detection of glaucoma and other disorders of the eye. Is the patient up to date with their annual eye exam?  Yes    Dental Screening: Recommended annual dental exams for proper oral hygiene  Community Resource Referral / Chronic Care Management: CRR required this visit?  No   CCM required this visit?  No      Plan:     I have personally reviewed and noted the following in the patient's chart:   Medical and social history Use of alcohol, tobacco or illicit drugs  Current medications and supplements including opioid prescriptions. Patient is not currently taking opioid prescriptions. Functional ability and status Nutritional status Physical activity Advanced directives List of other physicians Hospitalizations, surgeries, and ER visits in previous 12 months Vitals Screenings to include cognitive, depression, and falls Referrals and appointments  In addition, I have reviewed and discussed with patient certain preventive protocols, quality metrics, and best practice recommendations. A written personalized care plan for preventive services as well as general preventive health recommendations were provided to patient.     Sharon Seller, NP   10/24/2022   Place of service: [facility]

## 2022-10-30 LAB — HEMOGLOBIN A1C: Hemoglobin A1C: 6.5

## 2022-11-06 LAB — HM DEXA SCAN

## 2022-11-26 ENCOUNTER — Encounter: Payer: Self-pay | Admitting: Nurse Practitioner

## 2022-11-27 ENCOUNTER — Encounter: Payer: Self-pay | Admitting: Nurse Practitioner

## 2022-11-27 ENCOUNTER — Non-Acute Institutional Stay (SKILLED_NURSING_FACILITY): Payer: Medicare Other | Admitting: Nurse Practitioner

## 2022-11-27 DIAGNOSIS — E1122 Type 2 diabetes mellitus with diabetic chronic kidney disease: Secondary | ICD-10-CM

## 2022-11-27 DIAGNOSIS — K219 Gastro-esophageal reflux disease without esophagitis: Secondary | ICD-10-CM

## 2022-11-27 DIAGNOSIS — K5904 Chronic idiopathic constipation: Secondary | ICD-10-CM

## 2022-11-27 DIAGNOSIS — I10 Essential (primary) hypertension: Secondary | ICD-10-CM

## 2022-11-27 DIAGNOSIS — M810 Age-related osteoporosis without current pathological fracture: Secondary | ICD-10-CM | POA: Diagnosis not present

## 2022-11-27 DIAGNOSIS — N1832 Chronic kidney disease, stage 3b: Secondary | ICD-10-CM

## 2022-11-27 DIAGNOSIS — I5022 Chronic systolic (congestive) heart failure: Secondary | ICD-10-CM | POA: Diagnosis not present

## 2022-11-27 DIAGNOSIS — I4811 Longstanding persistent atrial fibrillation: Secondary | ICD-10-CM

## 2022-11-27 DIAGNOSIS — J9611 Chronic respiratory failure with hypoxia: Secondary | ICD-10-CM

## 2022-11-27 NOTE — Progress Notes (Signed)
Location:  [facility]) Nursing Home Room Number: 416 A Place of Service:  SNF (31)  Sayler Mickiewicz K. Janyth Contes, NP   Patient Care Team: Earnestine Mealing, MD as PCP - General (Family Medicine) Benita Gutter, RN as Registered Nurse  Extended Emergency Contact Information Primary Emergency Contact: [name] Address: [address]          [place], [place] [address] [name] of [place] Home Phone: [phone] Work Phone: [phone] Mobile Phone: [phone] Relation: Daughter Secondary Emergency Contact: Raina Mina Home Phone: [phone] Relation: Son  Goals of care: Advanced Directive information    11/27/2022   12:20 PM  Advanced Directives  Does Patient Have a Medical Advance Directive? Yes  Type of Advance Directive Out of facility DNR (pink MOST or yellow form)  Does patient want to make changes to medical advance directive? No - Patient declined  Pre-existing out of facility DNR order (yellow form or pink MOST form) Yellow form placed in chart (order not valid for inpatient use)     Chief Complaint  Patient presents with   Medical Management of Chronic Issues    Routine follow-up.    HPI:  Pt is a 89 y.o. female seen today for medical management of chronic disease.  Here with daughter. She is a long term resident at [facility].  Nursing does not have any concerns.  Had bone density but only took measurements of wrist, -5.7 She was on fosamax in the past and tolerated it well but has been off for 5 years.   GERD- on protonix, reports occasionally symptoms.   A fib- rate controlled   Htn- typically well controlled   CHF- euvolemic- typically swells in middle section if she is going to swell.   DM- A1c well controlled on last A1c  Constipation has to strain when having a BM, getting senna every morning.   Past Medical History:  Diagnosis Date   Acute exacerbation of CHF (congestive heart failure) (HCC) 11/18/2021   AKI (acute kidney  injury) (HCC) 11/26/2021   Anemia    CHF (congestive heart failure) (HCC)    Diabetes mellitus without complication (HCC) 03/13/2017   Currently no high BS.  Has come off meds.  But having episodes of low BS now.   Dilated idiopathic cardiomyopathy (HCC)    Dizziness 02/18/2017   GERD (gastroesophageal reflux disease)    Hyperkalemia 11/26/2021   Hyperlipidemia    Hypertension    Hyponatremia 06/09/2017   Hypothyroidism    Osteoporosis    Renal disorder    stage 3   Past Surgical History:  Procedure Laterality Date   BUNIONECTOMY     COLONOSCOPY     ESOPHAGOGASTRODUODENOSCOPY (EGD) WITH PROPOFOL N/A 10/15/2017   Procedure: ESOPHAGOGASTRODUODENOSCOPY (EGD) WITH PROPOFOL;  Surgeon: Christena Deem, MD;  Location: [facility] ENDOSCOPY;  Service: Endoscopy;  Laterality: N/A;   KYPHOPLASTY N/A 06/12/2017   Procedure: [id number];  Surgeon: Kennedy Bucker, MD;  Location: [facility] ORS;  Service: Orthopedics;  Laterality: N/A;   TONSILLECTOMY      Allergies  Allergen Reactions   Ciprofloxacin    Digoxin And Related    Entresto [Sacubitril-Valsartan] Other (See Comments)    Hypotensive    Erythromycin    Lopid [Gemfibrozil]    Nitrofurantoin    Spironolactone Other (See Comments)    Hospitalized for dehydration   Cefuroxime Rash   Penicillins Rash    Has patient had a PCN reaction causing immediate rash, facial/tongue/throat swelling, SOB or lightheadedness with hypotension: Unknown  Has patient had a PCN reaction causing severe rash involving mucus membranes or skin necrosis: No Has patient had a PCN reaction that required hospitalization: No Has patient had a PCN reaction occurring within the last 10 years: No If all of the above answers are "NO", then may proceed with Cephalosporin use.     Outpatient Encounter Medications as of 11/27/2022  Medication Sig   acetaminophen (TYLENOL) 325 MG tablet Take 2 tablets (650 mg total) by mouth every 4 (four) hours as needed for headache or  mild pain.   apixaban (ELIQUIS) 2.5 MG TABS tablet Take 1 tablet (2.5 mg total) by mouth 2 (two) times daily. Start on 11/30/2021   ARTIFICIAL TEAR SOLUTION OP Apply 1 drop to eye 2 (two) times daily. Both Eyes.   atorvastatin (LIPITOR) 80 MG tablet Take 1 tablet (80 mg total) by mouth daily.   calcium elemental as carbonate (TUMS ULTRA 1000) 400 MG chewable tablet Chew 1,000 mg by mouth 3 (three) times daily as needed for heartburn.   diclofenac Sodium (VOLTAREN) 1 % GEL Apply 2 g topically every 6 (six) hours as needed. Apply to back topically every shift for pain.   ergocalciferol (VITAMIN D2) 50000 units capsule Take 50,000 Units by mouth once a week. Monday   ipratropium-albuterol (DUONEB) 0.5-2.5 (3) MG/3ML SOLN Take 3 mLs by nebulization 3 (three) times daily as needed.   JARDIANCE 10 MG TABS tablet Take 10 mg by mouth daily.   levothyroxine (SYNTHROID, LEVOTHROID) 25 MCG tablet Take 25 mcg by mouth daily before breakfast.   OXYGEN 4lpm via [place] for SOB continuous.   pantoprazole (PROTONIX) 40 MG tablet Take 40 mg by mouth in the morning and at bedtime.   senna-docusate (SENOKOT-S) 8.6-50 MG tablet Take 2 tablets by mouth daily.   sertraline (ZOLOFT) 50 MG tablet Take 50 mg by mouth daily.   torsemide (DEMADEX) 10 MG tablet Take 20 mg by mouth daily.   valsartan (DIOVAN) 80 MG tablet Take 160 mg by mouth daily.   No facility-administered encounter medications on file as of 11/27/2022.    Review of Systems  Constitutional:  Negative for activity change, appetite change, fatigue and unexpected weight change.  HENT:  Negative for congestion and hearing loss.   Eyes: Negative.   Respiratory:  Negative for cough and shortness of breath.   Cardiovascular:  Negative for chest pain, palpitations and leg swelling.  Gastrointestinal:  Positive for constipation. Negative for abdominal pain and diarrhea.  Genitourinary:  Negative for difficulty urinating and dysuria.  Musculoskeletal:  Negative  for arthralgias and myalgias.  Skin:  Negative for color change and wound.  Neurological:  Negative for dizziness and weakness.  Psychiatric/Behavioral:  Negative for agitation, behavioral problems and confusion.      Immunization History  Administered Date(s) Administered   Influenza Inj Mdck Quad Pf 03/10/2019, 03/18/2021   Influenza Split 04/18/2014, 05/01/2015   Influenza, High Dose Seasonal PF 04/01/2022   Influenza,inj,Quad PF,6+ Mos 03/12/2020   Influenza-Unspecified 03/24/2012, 03/24/2013, 04/18/2014, 05/01/2015   PFIZER Comirnaty(Gray Top)Covid-19 Tri-Sucrose Vaccine 07/21/2019, 08/12/2019   Pfizer Covid-19 Vaccine Bivalent Booster 70yrs & up 11/22/2021   Pneumococcal Polysaccharide-23 08/20/2015, 04/29/2022   Pertinent  Health Maintenance Due  Topic Date Due   INFLUENZA VACCINE  01/15/2023   HEMOGLOBIN A1C  05/02/2023   OPHTHALMOLOGY EXAM  08/25/2023   FOOT EXAM  08/26/2023   DEXA SCAN  Completed      11/28/2021    7:57 PM 11/29/2021    7:59 AM  12/18/2021   12:34 PM 08/14/2022   11:35 AM 10/23/2022    2:27 PM  Fall Risk  Falls in the past year?   0 0 0  Was there an injury with Fall?   0 0   Fall Risk Category Calculator   0 0   Fall Risk Category (Retired)   Low    (RETIRED) Patient Fall Risk Level High fall risk High fall risk Moderate fall risk    Patient at Risk for Falls Due to   Impaired mobility;Impaired balance/gait No Fall Risks   Fall risk Follow up   Falls evaluation completed;Education provided;Falls prevention discussed Falls evaluation completed    Functional Status Survey:    Vitals:   11/27/22 1219 11/27/22 1354  BP: (!) 163/82 109/68  Pulse: 89   Resp: 18   Temp: (!) 97.1 F (36.2 C)   SpO2: 95%   Weight: 120 lb 12.8 oz (54.8 kg)   Height: 4\' 9"  (1.448 m)    Body mass index is 26.14 kg/m. Physical Exam Constitutional:      General: She is not in acute distress.    Appearance: She is well-developed. She is not diaphoretic.  HENT:      Head: Normocephalic and atraumatic.     Mouth/Throat:     Pharynx: No oropharyngeal exudate.  Eyes:     Conjunctiva/sclera: Conjunctivae normal.     Pupils: Pupils are equal, round, and reactive to light.  Cardiovascular:     Rate and Rhythm: Normal rate and regular rhythm.     Heart sounds: Normal heart sounds.  Pulmonary:     Effort: Pulmonary effort is normal.     Breath sounds: Normal breath sounds.  Abdominal:     General: Bowel sounds are normal.     Palpations: Abdomen is soft.  Musculoskeletal:     Cervical back: Normal range of motion and neck supple.     Right lower leg: No edema.     Left lower leg: No edema.  Skin:    General: Skin is warm and dry.  Neurological:     Mental Status: She is alert.     Motor: Weakness present.     Gait: Gait abnormal.  Psychiatric:        Mood and Affect: Mood normal.     Labs reviewed: Recent Labs    11/28/21 0612 11/29/21 0653 02/10/22 0000 08/18/22 0000  NA 139 139 142 141  K 3.9 4.2 3.9 3.5  CL 104 106 100 104  CO2 27 28 36* 28*  GLUCOSE 105* 120*  --   --   BUN 51* 37* 27* 29*  CREATININE 0.95 0.89 1.2* 1.3*  CALCIUM 8.7* 8.4* 9.2 8.8   Recent Labs    02/10/22 0000 08/18/22 0000  AST  --  28  ALT  --  21  ALKPHOS  --  89  ALBUMIN 3.8 3.7   Recent Labs    11/28/21 0612 08/18/22 0000 09/01/22 0000 09/11/22 0000  WBC 6.1 5.5 7.5 6.3  NEUTROABS  --  3,746.00 5,363.00  --   HGB 15.1* 13.6 13.5 13.5  HCT 47.5* 42 40 41  MCV 91.9  --   --   --   PLT 167 124* 146* 147*   Lab Results  Component Value Date   TSH 2.925 11/18/2021   Lab Results  Component Value Date   HGBA1C 6.5 10/30/2022   Lab Results  Component Value Date   CHOL 146 07/17/2022  HDL 38 07/17/2022   LDLCALC 77 07/17/2022   TRIG 226 (A) 07/17/2022   CHOLHDL 7.7 11/27/2021    Significant Diagnostic Results in last 30 days:  No results found.  Assessment/Plan 1. Age-related osteoporosis without current pathological fracture -t  score of -5.7 on wrist.  She has been on fosamax in the past, will restart fosamax 70 mg weekly -continues on vit d, will add calcium 600 mg daily   2. Gastroesophageal reflux disease without esophagitis -stable on protonix,   3. Type 2 diabetes mellitus with stage 3b chronic kidney disease, without long-term current use of insulin (HCC) -well controlled, Encouraged dietary compliance, routine foot care/monitoring and to keep up with diabetic eye exams through ophthalmology  -will continue current regimen  4. Chronic systolic CHF (congestive heart failure), NYHA class 3 (HCC) -euvolemic, continues on demasex 10 mg daily  5. Chronic idiopathic constipation -will add miralax 17 gm every other day to regimen to help with bowel movements.   6. Chronic respiratory failure with hypoxia (HCC) -continues on long term o2  7. Longstanding persistent atrial fibrillation (HCC) -rate controlled, continues on eliquis BID  8. Primary hypertension -Blood pressure well controlled, goal bp <140/90 Continue current medications and dietary modifications follow metabolic panel    Emmalee Solivan K. Biagio Borg [facility] [phone]

## 2022-12-04 ENCOUNTER — Encounter: Payer: Self-pay | Admitting: Student

## 2022-12-04 ENCOUNTER — Non-Acute Institutional Stay (SKILLED_NURSING_FACILITY): Payer: Medicare Other | Admitting: Student

## 2022-12-04 DIAGNOSIS — L601 Onycholysis: Secondary | ICD-10-CM | POA: Diagnosis not present

## 2022-12-04 DIAGNOSIS — M109 Gout, unspecified: Secondary | ICD-10-CM

## 2022-12-04 NOTE — Progress Notes (Signed)
Location:  Other [facility].  Nursing Home Room Number: [facility] [address] Place of Service:  SNF [phone]) Provider:  Earnestine Mealing, MD  Patient Care Team: Earnestine Mealing, MD as PCP - General (Family Medicine) Benita Gutter, RN as Registered Nurse  Extended Emergency Contact Information Primary Emergency Contact: [name] Address: [address]          [place], [place] [address] [name] of [place] Home Phone: [phone] Work Phone: [phone] Mobile Phone: [phone] Relation: Daughter Secondary Emergency Contact: Raina Mina Home Phone: [phone] Relation: Son  Code Status:  DNR Goals of care: Advanced Directive information    12/04/2022   12:05 PM  Advanced Directives  Does Patient Have a Medical Advance Directive? Yes  Type of Advance Directive Out of facility DNR (pink MOST or yellow form)  Does patient want to make changes to medical advance directive? No - Patient declined     Chief Complaint  Patient presents with   Acute Visit    Toe Pain    HPI:  Pt is a 89 y.o. female seen today for an acute visit for Toe Pain.   Left toe pain and toenail pain started yesterday. No drainage. Daughter at beside.   Past Medical History:  Diagnosis Date   Acute exacerbation of CHF (congestive heart failure) (HCC) 11/18/2021   AKI (acute kidney injury) (HCC) 11/26/2021   Anemia    CHF (congestive heart failure) (HCC)    Diabetes mellitus without complication (HCC) 03/13/2017   Currently no high BS.  Has come off meds.  But having episodes of low BS now.   Dilated idiopathic cardiomyopathy (HCC)    Dizziness 02/18/2017   GERD (gastroesophageal reflux disease)    Hyperkalemia 11/26/2021   Hyperlipidemia    Hypertension    Hyponatremia 06/09/2017   Hypothyroidism    Osteoporosis    Renal disorder    stage 3   Past Surgical History:  Procedure Laterality Date   BUNIONECTOMY     COLONOSCOPY     ESOPHAGOGASTRODUODENOSCOPY (EGD) WITH PROPOFOL  N/A 10/15/2017   Procedure: ESOPHAGOGASTRODUODENOSCOPY (EGD) WITH PROPOFOL;  Surgeon: Christena Deem, MD;  Location: [facility] ENDOSCOPY;  Service: Endoscopy;  Laterality: N/A;   KYPHOPLASTY N/A 06/12/2017   Procedure: [id number]-Y4;  Surgeon: Kennedy Bucker, MD;  Location: [facility] ORS;  Service: Orthopedics;  Laterality: N/A;   TONSILLECTOMY      Allergies  Allergen Reactions   Ciprofloxacin    Digoxin And Related    Entresto [Sacubitril-Valsartan] Other (See Comments)    Hypotensive    Erythromycin    Lopid [Gemfibrozil]    Nitrofurantoin    Spironolactone Other (See Comments)    Hospitalized for dehydration   Cefuroxime Rash   Penicillins Rash    Has patient had a PCN reaction causing immediate rash, facial/tongue/throat swelling, SOB or lightheadedness with hypotension: Unknown Has patient had a PCN reaction causing severe rash involving mucus membranes or skin necrosis: No Has patient had a PCN reaction that required hospitalization: No Has patient had a PCN reaction occurring within the last 10 years: No If all of the above answers are "NO", then may proceed with Cephalosporin use.     Outpatient Encounter Medications as of 12/04/2022  Medication Sig   acetaminophen (TYLENOL) 325 MG tablet Take 2 tablets (650 mg total) by mouth every 4 (four) hours as needed for headache or mild pain.   alendronate (FOSAMAX) 70 MG tablet Take 70 mg by mouth once a week. Take with a full glass  of water on an empty stomach.   apixaban (ELIQUIS) 2.5 MG TABS tablet Take 1 tablet (2.5 mg total) by mouth 2 (two) times daily. Start on 11/30/2021   ARTIFICIAL TEAR SOLUTION OP Apply 1 drop to eye 2 (two) times daily. Both Eyes.   atorvastatin (LIPITOR) 80 MG tablet Take 1 tablet (80 mg total) by mouth daily.   calcium elemental as carbonate (TUMS ULTRA 1000) 400 MG chewable tablet Chew 1,000 mg by mouth 3 (three) times daily as needed for heartburn.   diclofenac Sodium (VOLTAREN) 1 % GEL Apply 2 g topically  every 6 (six) hours as needed. Apply to back topically every shift for pain.   ergocalciferol (VITAMIN D2) 50000 units capsule Take 50,000 Units by mouth once a week. Monday   ipratropium-albuterol (DUONEB) 0.5-2.5 (3) MG/3ML SOLN Take 3 mLs by nebulization 3 (three) times daily as needed.   JARDIANCE 10 MG TABS tablet Take 10 mg by mouth daily.   levothyroxine (SYNTHROID, LEVOTHROID) 25 MCG tablet Take 25 mcg by mouth daily before breakfast.   magnesium hydroxide (MILK OF MAGNESIA) 400 MG/5ML suspension Take 30 mLs by mouth daily as needed for mild constipation.   OXYGEN 4lpm via [place] for SOB continuous.   pantoprazole (PROTONIX) 40 MG tablet Take 40 mg by mouth in the morning and at bedtime.   polyethylene glycol (MIRALAX / GLYCOLAX) 17 g packet Take 17 g by mouth every other day.   senna-docusate (SENOKOT-S) 8.6-50 MG tablet Take 2 tablets by mouth daily.   sertraline (ZOLOFT) 50 MG tablet Take 50 mg by mouth daily.   torsemide (DEMADEX) 10 MG tablet Take 20 mg by mouth daily.   valsartan (DIOVAN) 80 MG tablet Take 160 mg by mouth daily.   No facility-administered encounter medications on file as of 12/04/2022.    Review of Systems  Immunization History  Administered Date(s) Administered   Influenza Inj Mdck Quad Pf 03/10/2019, 03/18/2021   Influenza Split 04/18/2014, 05/01/2015   Influenza, High Dose Seasonal PF 04/01/2022   Influenza,inj,Quad PF,6+ Mos 03/12/2020   Influenza-Unspecified 03/24/2012, 03/24/2013, 04/18/2014, 05/01/2015   PFIZER Comirnaty(Gray Top)Covid-19 Tri-Sucrose Vaccine 07/21/2019, 08/12/2019   Pfizer Covid-19 Vaccine Bivalent Booster 52yrs & up 11/22/2021   Pneumococcal Polysaccharide-23 08/20/2015, 04/29/2022   Pertinent  Health Maintenance Due  Topic Date Due   INFLUENZA VACCINE  01/15/2023   HEMOGLOBIN A1C  05/02/2023   OPHTHALMOLOGY EXAM  08/25/2023   FOOT EXAM  08/26/2023   DEXA SCAN  Completed      11/28/2021    7:57 PM 11/29/2021    7:59 AM 12/18/2021    12:34 PM 08/14/2022   11:35 AM 10/23/2022    2:27 PM  Fall Risk  Falls in the past year?   0 0 0  Was there an injury with Fall?   0 0   Fall Risk Category Calculator   0 0   Fall Risk Category (Retired)   Low    (RETIRED) Patient Fall Risk Level High fall risk High fall risk Moderate fall risk    Patient at Risk for Falls Due to   Impaired mobility;Impaired balance/gait No Fall Risks   Fall risk Follow up   Falls evaluation completed;Education provided;Falls prevention discussed Falls evaluation completed    Functional Status Survey:    Vitals:   12/04/22 1155 12/04/22 1203  BP: (!) 167/90 130/75  Pulse: 87   Resp: 18   Temp: (!) 97.1 F (36.2 C)   SpO2: 94%   Weight: 121 lb  12.8 oz (55.2 kg)   Height: 4\' 9"  (1.448 m)    Body mass index is 26.36 kg/m. Physical Exam Musculoskeletal:     Comments: Left great toe with tenderness of great toe, warm, red, swollen. Toenail at a 80 degree angle from the nail bed.   Neurological:     Mental Status: She is alert.     Labs reviewed: Recent Labs    02/10/22 0000 08/18/22 0000  NA 142 141  K 3.9 3.5  CL 100 104  CO2 36* 28*  BUN 27* 29*  CREATININE 1.2* 1.3*  CALCIUM 9.2 8.8   Recent Labs    02/10/22 0000 08/18/22 0000  AST  --  28  ALT  --  21  ALKPHOS  --  89  ALBUMIN 3.8 3.7   Recent Labs    08/18/22 0000 09/01/22 0000 09/11/22 0000  WBC 5.5 7.5 6.3  NEUTROABS 3,746.00 5,363.00  --   HGB 13.6 13.5 13.5  HCT 42 40 41  PLT 124* 146* 147*   Lab Results  Component Value Date   TSH 2.925 11/18/2021   Lab Results  Component Value Date   HGBA1C 6.5 10/30/2022   Lab Results  Component Value Date   CHOL 146 07/17/2022   HDL 38 07/17/2022   LDLCALC 77 07/17/2022   TRIG 226 (A) 07/17/2022   CHOLHDL 7.7 11/27/2021    Significant Diagnostic Results in last 30 days:  No results found.  Assessment/Plan Acute gout involving toe of left foot, unspecified cause  Onycholysis Patient with warm, swollen  red toe. No history of gout. Hx of CKD. Some concern it is contributing to the development of disease. Uric acid 7.0. Plan to start Colchicine 0.3mg  today and again every 3 days until symptoms subside.   Family/ staff Communication: nursing  Labs/tests ordered:  uric acid

## 2022-12-09 LAB — HM DIABETES FOOT EXAM

## 2022-12-12 ENCOUNTER — Encounter: Payer: Self-pay | Admitting: Student

## 2023-01-08 LAB — CBC AND DIFFERENTIAL
HCT: 42 (ref 36–46)
Hemoglobin: 13.2 (ref 12.0–16.0)
Platelets: 147 10*3/uL — AB (ref 150–400)
WBC: 6

## 2023-01-08 LAB — CBC: RBC: 4.81 (ref 3.87–5.11)

## 2023-02-05 LAB — CBC AND DIFFERENTIAL
HCT: 44 (ref 36–46)
Hemoglobin: 13.7 (ref 12.0–16.0)
Neutrophils Absolute: 4381
Platelets: 163 10*3/uL (ref 150–400)
WBC: 6.5

## 2023-02-05 LAB — CBC: RBC: 5.11 (ref 3.87–5.11)

## 2023-02-11 ENCOUNTER — Non-Acute Institutional Stay (SKILLED_NURSING_FACILITY): Payer: Medicare Other | Admitting: Student

## 2023-02-11 ENCOUNTER — Encounter: Payer: Self-pay | Admitting: Student

## 2023-02-11 DIAGNOSIS — U071 COVID-19: Secondary | ICD-10-CM

## 2023-02-11 DIAGNOSIS — E119 Type 2 diabetes mellitus without complications: Secondary | ICD-10-CM | POA: Diagnosis not present

## 2023-02-11 DIAGNOSIS — J9611 Chronic respiratory failure with hypoxia: Secondary | ICD-10-CM

## 2023-02-11 DIAGNOSIS — E1122 Type 2 diabetes mellitus with diabetic chronic kidney disease: Secondary | ICD-10-CM | POA: Diagnosis not present

## 2023-02-11 DIAGNOSIS — I420 Dilated cardiomyopathy: Secondary | ICD-10-CM

## 2023-02-11 DIAGNOSIS — N183 Chronic kidney disease, stage 3 unspecified: Secondary | ICD-10-CM

## 2023-02-11 DIAGNOSIS — I10 Essential (primary) hypertension: Secondary | ICD-10-CM

## 2023-02-11 DIAGNOSIS — K7460 Unspecified cirrhosis of liver: Secondary | ICD-10-CM

## 2023-02-11 DIAGNOSIS — I252 Old myocardial infarction: Secondary | ICD-10-CM | POA: Diagnosis not present

## 2023-02-11 DIAGNOSIS — I482 Chronic atrial fibrillation, unspecified: Secondary | ICD-10-CM

## 2023-02-11 DIAGNOSIS — I700 Atherosclerosis of aorta: Secondary | ICD-10-CM

## 2023-02-11 NOTE — Progress Notes (Signed)
Location:  Other [facility].  Nursing Home Room Number: [facility] [address] Place of Service:  SNF [phone]) Provider:  Earnestine Mealing, MD  Patient Care Team: Earnestine Mealing, MD as PCP - General (Family Medicine) Benita Gutter, RN as Registered Nurse  Extended Emergency Contact Information Primary Emergency Contact: [name] Address: [address]          [place], [place] [address] [name] of [place] Home Phone: [phone] Work Phone: [phone] Mobile Phone: [phone] Relation: Daughter Secondary Emergency Contact: Raina Mina Home Phone: [phone] Relation: Son  Code Status:  DNR Goals of care: Advanced Directive information    02/11/2023    9:49 AM  Advanced Directives  Does Patient Have a Medical Advance Directive? Yes  Type of Advance Directive Out of facility DNR (pink MOST or yellow form)  Does patient want to make changes to medical advance directive? No - Patient declined     Chief Complaint  Patient presents with   Medical Management of Chronic Issues    Medical Management of Chronic Issues.     HPI:  Pt is a 89 y.o. female seen today for medical management of chronic diseases.     Past Medical History:  Diagnosis Date   Acute exacerbation of CHF (congestive heart failure) (HCC) 11/18/2021   AKI (acute kidney injury) (HCC) 11/26/2021   Anemia    CHF (congestive heart failure) (HCC)    Diabetes mellitus without complication (HCC) 03/13/2017   Currently no high BS.  Has come off meds.  But having episodes of low BS now.   Dilated idiopathic cardiomyopathy (HCC)    Dizziness 02/18/2017   GERD (gastroesophageal reflux disease)    Hyperkalemia 11/26/2021   Hyperlipidemia    Hypertension    Hyponatremia 06/09/2017   Hypothyroidism    Osteoporosis    Renal disorder    stage 3   Past Surgical History:  Procedure Laterality Date   BUNIONECTOMY     COLONOSCOPY     ESOPHAGOGASTRODUODENOSCOPY (EGD) WITH PROPOFOL N/A 10/15/2017    Procedure: ESOPHAGOGASTRODUODENOSCOPY (EGD) WITH PROPOFOL;  Surgeon: Christena Deem, MD;  Location: [facility] ENDOSCOPY;  Service: Endoscopy;  Laterality: N/A;   KYPHOPLASTY N/A 06/12/2017   Procedure: [id number]-Y4;  Surgeon: Kennedy Bucker, MD;  Location: [facility] ORS;  Service: Orthopedics;  Laterality: N/A;   TONSILLECTOMY      Allergies  Allergen Reactions   Ciprofloxacin    Digoxin And Related    Entresto [Sacubitril-Valsartan] Other (See Comments)    Hypotensive    Erythromycin    Lopid [Gemfibrozil]    Nitrofurantoin    Spironolactone Other (See Comments)    Hospitalized for dehydration   Cefuroxime Rash   Penicillins Rash    Has patient had a PCN reaction causing immediate rash, facial/tongue/throat swelling, SOB or lightheadedness with hypotension: Unknown Has patient had a PCN reaction causing severe rash involving mucus membranes or skin necrosis: No Has patient had a PCN reaction that required hospitalization: No Has patient had a PCN reaction occurring within the last 10 years: No If all of the above answers are "NO", then may proceed with Cephalosporin use.     Outpatient Encounter Medications as of 02/11/2023  Medication Sig   acetaminophen (TYLENOL) 325 MG tablet Take 2 tablets (650 mg total) by mouth every 4 (four) hours as needed for headache or mild pain.   alendronate (FOSAMAX) 70 MG tablet Take 70 mg by mouth once a week. Take with a full glass of water on an empty stomach.  apixaban (ELIQUIS) 2.5 MG TABS tablet Take 1 tablet (2.5 mg total) by mouth 2 (two) times daily. Start on 11/30/2021   ARTIFICIAL TEAR SOLUTION OP Apply 1 drop to eye 2 (two) times daily. Both Eyes.   atorvastatin (LIPITOR) 80 MG tablet Take 1 tablet (80 mg total) by mouth daily.   calcium elemental as carbonate (TUMS ULTRA 1000) 400 MG chewable tablet Chew 1,000 mg by mouth 3 (three) times daily as needed for heartburn.   diclofenac Sodium (VOLTAREN) 1 % GEL Apply 2 g topically every 6 (six)  hours as needed. Apply to back topically every shift for pain.   ergocalciferol (VITAMIN D2) 50000 units capsule Take 50,000 Units by mouth once a week. Monday   ipratropium-albuterol (DUONEB) 0.5-2.5 (3) MG/3ML SOLN Take 3 mLs by nebulization 3 (three) times daily as needed.   JARDIANCE 10 MG TABS tablet Take 10 mg by mouth daily.   levothyroxine (SYNTHROID, LEVOTHROID) 25 MCG tablet Take 25 mcg by mouth daily before breakfast.   magnesium hydroxide (MILK OF MAGNESIA) 400 MG/5ML suspension Take 30 mLs by mouth daily as needed for mild constipation.   OXYGEN 4lpm via [place] for SOB continuous.   pantoprazole (PROTONIX) 40 MG tablet Take 40 mg by mouth in the morning and at bedtime.   polyethylene glycol (MIRALAX / GLYCOLAX) 17 g packet Take 17 g by mouth every other day.   senna-docusate (SENOKOT-S) 8.6-50 MG tablet Take 2 tablets by mouth daily.   sertraline (ZOLOFT) 50 MG tablet Take 50 mg by mouth daily.   torsemide (DEMADEX) 10 MG tablet Take 20 mg by mouth daily.   valsartan (DIOVAN) 80 MG tablet Take 160 mg by mouth daily.   No facility-administered encounter medications on file as of 02/11/2023.    Review of Systems  Immunization History  Administered Date(s) Administered   Influenza Inj Mdck Quad Pf 03/10/2019, 03/18/2021   Influenza Split 04/18/2014, 05/01/2015   Influenza, High Dose Seasonal PF 04/01/2022   Influenza,inj,Quad PF,6+ Mos 03/12/2020   Influenza-Unspecified 03/24/2012, 03/24/2013, 04/18/2014, 05/01/2015   PFIZER Comirnaty(Gray Top)Covid-19 Tri-Sucrose Vaccine 07/21/2019, 08/12/2019   PNEUMOCOCCAL CONJUGATE-20 04/29/2022   Pfizer Covid-19 Vaccine Bivalent Booster 54yrs & up 11/22/2021   Pneumococcal Polysaccharide-23 08/20/2015, 04/29/2022   Unspecified SARS-COV-2 Vaccination 07/21/2019, 08/11/2019   Pertinent  Health Maintenance Due  Topic Date Due   INFLUENZA VACCINE  01/15/2023   HEMOGLOBIN A1C  05/02/2023   OPHTHALMOLOGY EXAM  08/25/2023   FOOT EXAM   12/09/2023   DEXA SCAN  Completed      11/28/2021    7:57 PM 11/29/2021    7:59 AM 12/18/2021   12:34 PM 08/14/2022   11:35 AM 10/23/2022    2:27 PM  Fall Risk  Falls in the past year?   0 0 0  Was there an injury with Fall?   0 0   Fall Risk Category Calculator   0 0   Fall Risk Category (Retired)   Low    (RETIRED) Patient Fall Risk Level High fall risk High fall risk Moderate fall risk    Patient at Risk for Falls Due to   Impaired mobility;Impaired balance/gait No Fall Risks   Fall risk Follow up   Falls evaluation completed;Education provided;Falls prevention discussed Falls evaluation completed    Functional Status Survey:    Vitals:   02/11/23 0942  BP: 126/66  Pulse: 96  Resp: 18  Temp: (!) 97 F (36.1 C)  SpO2: 92%  Weight: 118 lb (53.5 kg)  Height:  4\' 9"  (1.448 m)   Body mass index is 25.53 kg/m. Physical Exam Vitals reviewed.  Constitutional:      Comments: Alert sitting in recliner, O2 in place.   Cardiovascular:     Rate and Rhythm: Normal rate. Rhythm irregular.     Pulses: Normal pulses.  Pulmonary:     Comments: Decreased breath sounds bilaterally Abdominal:     General: Abdomen is flat.     Palpations: Abdomen is soft.  Neurological:     General: No focal deficit present.     Mental Status: She is alert.     Labs reviewed: Recent Labs    08/18/22 0000  NA 141  K 3.5  CL 104  CO2 28*  BUN 29*  CREATININE 1.3*  CALCIUM 8.8   Recent Labs    08/18/22 0000  AST 28  ALT 21  ALKPHOS 89  ALBUMIN 3.7   Recent Labs    08/18/22 0000 09/01/22 0000 09/11/22 0000 01/08/23 0000 02/05/23 0000  WBC 5.5 7.5 6.3 6.0 6.5  NEUTROABS 3,746.00 5,363.00  --   --  4,381.00  HGB 13.6 13.5 13.5 13.2 13.7  HCT 42 40 41 42 44  PLT 124* 146* 147* 147* 163   Lab Results  Component Value Date   TSH 2.925 11/18/2021   Lab Results  Component Value Date   HGBA1C 6.5 10/30/2022   Lab Results  Component Value Date   CHOL 146 07/17/2022   HDL 38  07/17/2022   LDLCALC 77 07/17/2022   TRIG 226 (A) 07/17/2022   CHOLHDL 7.7 11/27/2021    Significant Diagnostic Results in last 30 days:  No results found.  Assessment/Plan COVID-19 virus infection  Hx of myocardial infarction  CKD stage 3 due to type 2 diabetes mellitus (HCC)  Type 2 diabetes mellitus without complication, without long-term current use of insulin (HCC)  Chronic respiratory failure with hypoxia (HCC)  Hepatic cirrhosis, unspecified hepatic cirrhosis type, unspecified whether ascites present (HCC)  Atherosclerosis of aorta (HCC)  Chronic atrial fibrillation (HCC)  Dilated idiopathic cardiomyopathy (HCC)  Primary hypertension Patient tested positive for covid. Taking molnupiravir and tolerating well. Encouraged high protein intake. Denies chest pain at this time. BP well-controlled. Kidney function stable. Avoid nephrotoxic and hepatotoxic medications when possible. Periodic bleeding while on anticoagulation, hgb stable. Appears euvolemic on exam.   Family/ staff Communication: nursing  Labs/tests ordered:  none

## 2023-02-13 ENCOUNTER — Encounter: Payer: Self-pay | Admitting: Student

## 2023-02-13 DIAGNOSIS — I252 Old myocardial infarction: Secondary | ICD-10-CM | POA: Insufficient documentation

## 2023-02-17 ENCOUNTER — Non-Acute Institutional Stay (SKILLED_NURSING_FACILITY): Payer: Medicare Other | Admitting: Nurse Practitioner

## 2023-02-17 ENCOUNTER — Encounter: Payer: Self-pay | Admitting: Nurse Practitioner

## 2023-02-17 DIAGNOSIS — U071 COVID-19: Secondary | ICD-10-CM

## 2023-02-17 NOTE — Progress Notes (Unsigned)
Location:  Other [facility] [place].  Nursing Home Room Number: [facility] [address] Place of Service:  SNF [id number]) Abbey Chatters, NP  PCP: Earnestine Mealing, MD  Patient Care Team: Earnestine Mealing, MD as PCP - General (Family Medicine) Benita Gutter, RN as Registered Nurse  Extended Emergency Contact Information Primary Emergency Contact: [name] Address: [address]          [place], [place] [address] [name] of [place] Home Phone: [phone] Mobile Phone: [phone] Relation: Daughter Secondary Emergency Contact: Raina Mina Home Phone: [phone] Relation: Son  Goals of care: Advanced Directive information    02/17/2023    9:32 AM  Advanced Directives  Does Patient Have a Medical Advance Directive? Yes  Type of Advance Directive Out of facility DNR (pink MOST or yellow form)  Does patient want to make changes to medical advance directive? No - Patient declined     Chief Complaint  Patient presents with   Acute Visit    Covid, SOB, Cough and Congestion.     HPI:  Pt is a 89 y.o. female seen today for an acute visit for Covid, SOB, Cough and Congestion.  Pt was diagnosised with COVID on 8/22 On 9/1 had chest xray due to worsening shortness of breath, cough and congestion and chest xray was without acute findings.  Pt reports ongoing shortness of breath but not in respiratory distress. She is on O2 and at this time maintain sats at 93% No fevers or chills Completed molnupiravir (unable to use paxlovid over contraindications)    Past Medical History:  Diagnosis Date   Acute exacerbation of CHF (congestive heart failure) (HCC) 11/18/2021   AKI (acute kidney injury) (HCC) 11/26/2021   Anemia    CHF (congestive heart failure) (HCC)    Diabetes mellitus without complication (HCC) 03/13/2017   Currently no high BS.  Has come off meds.  But having episodes of low BS now.   Dilated idiopathic cardiomyopathy (HCC)    Dizziness 02/18/2017   GERD  (gastroesophageal reflux disease)    Hyperkalemia 11/26/2021   Hyperlipidemia    Hypertension    Hyponatremia 06/09/2017   Hypothyroidism    Osteoporosis    Renal disorder    stage 3   Past Surgical History:  Procedure Laterality Date   BUNIONECTOMY     COLONOSCOPY     ESOPHAGOGASTRODUODENOSCOPY (EGD) WITH PROPOFOL N/A 10/15/2017   Procedure: ESOPHAGOGASTRODUODENOSCOPY (EGD) WITH PROPOFOL;  Surgeon: Christena Deem, MD;  Location: [facility] ENDOSCOPY;  Service: Endoscopy;  Laterality: N/A;   KYPHOPLASTY N/A 06/12/2017   Procedure: [id number];  Surgeon: Kennedy Bucker, MD;  Location: [facility] ORS;  Service: Orthopedics;  Laterality: N/A;   TONSILLECTOMY      Allergies  Allergen Reactions   Ciprofloxacin    Digoxin And Related    Entresto [Sacubitril-Valsartan] Other (See Comments)    Hypotensive    Erythromycin    Lopid [Gemfibrozil]    Nitrofurantoin    Spironolactone Other (See Comments)    Hospitalized for dehydration   Cefuroxime Rash   Penicillins Rash    Has patient had a PCN reaction causing immediate rash, facial/tongue/throat swelling, SOB or lightheadedness with hypotension: Unknown Has patient had a PCN reaction causing severe rash involving mucus membranes or skin necrosis: No Has patient had a PCN reaction that required hospitalization: No Has patient had a PCN reaction occurring within the last 10 years: No If all of the above answers are "NO", then Aria Jarrard proceed with Cephalosporin use.  Outpatient Encounter Medications as of 02/17/2023  Medication Sig   acetaminophen (TYLENOL) 325 MG tablet Take 2 tablets (650 mg total) by mouth every 4 (four) hours as needed for headache or mild pain.   alendronate (FOSAMAX) 70 MG tablet Take 70 mg by mouth once a week. Take with a full glass of water on an empty stomach.   apixaban (ELIQUIS) 2.5 MG TABS tablet Take 1 tablet (2.5 mg total) by mouth 2 (two) times daily. Start on 11/30/2021   ARTIFICIAL TEAR SOLUTION OP Apply 1  drop to eye 2 (two) times daily. Both Eyes.   atorvastatin (LIPITOR) 80 MG tablet Take 1 tablet (80 mg total) by mouth daily.   calcium elemental as carbonate (TUMS ULTRA 1000) 400 MG chewable tablet Chew 1,000 mg by mouth 3 (three) times daily as needed for heartburn.   diclofenac Sodium (VOLTAREN) 1 % GEL Apply 2 g topically every 6 (six) hours as needed. Apply to back topically every shift for pain.   ergocalciferol (VITAMIN D2) 50000 units capsule Take 50,000 Units by mouth once a week. Monday   ipratropium-albuterol (DUONEB) 0.5-2.5 (3) MG/3ML SOLN Take 3 mLs by nebulization 3 (three) times daily as needed.   JARDIANCE 10 MG TABS tablet Take 10 mg by mouth daily.   levothyroxine (SYNTHROID, LEVOTHROID) 25 MCG tablet Take 25 mcg by mouth daily before breakfast.   magnesium hydroxide (MILK OF MAGNESIA) 400 MG/5ML suspension Take 30 mLs by mouth daily as needed for mild constipation.   OXYGEN 4lpm via  for SOB continuous.   pantoprazole (PROTONIX) 40 MG tablet Take 40 mg by mouth in the morning and at bedtime.   polyethylene glycol (MIRALAX / GLYCOLAX) 17 g packet Take 17 g by mouth every other day.   senna-docusate (SENOKOT-S) 8.6-50 MG tablet Take 2 tablets by mouth daily.   sertraline (ZOLOFT) 50 MG tablet Take 50 mg by mouth daily.   torsemide (DEMADEX) 10 MG tablet Take 20 mg by mouth daily.   valsartan (DIOVAN) 80 MG tablet Take 160 mg by mouth daily.   No facility-administered encounter medications on file as of 02/17/2023.    Review of Systems  Constitutional:  Positive for appetite change and fatigue. Negative for activity change and unexpected weight change.  HENT:  Negative for congestion and hearing loss.   Eyes: Negative.   Respiratory:  Positive for cough and shortness of breath.   Cardiovascular:  Negative for chest pain, palpitations and leg swelling.  Gastrointestinal:  Negative for abdominal pain, constipation and diarrhea.  Genitourinary:  Negative for difficulty  urinating and dysuria.  Musculoskeletal:  Negative for arthralgias and myalgias.  Skin:  Negative for color change and wound.  Neurological:  Positive for weakness. Negative for dizziness.  Psychiatric/Behavioral:  Negative for agitation, behavioral problems and confusion.     Immunization History  Administered Date(s) Administered   Influenza Inj Mdck Quad Pf 03/10/2019, 03/18/2021   Influenza Split 04/18/2014, 05/01/2015   Influenza, High Dose Seasonal PF 04/01/2022   Influenza,inj,Quad PF,6+ Mos 03/12/2020   Influenza-Unspecified 03/24/2012, 03/24/2013, 04/18/2014, 05/01/2015   PFIZER Comirnaty(Gray Top)Covid-19 Tri-Sucrose Vaccine 07/21/2019, 08/12/2019   PNEUMOCOCCAL CONJUGATE-20 04/29/2022   Pfizer Covid-19 Vaccine Bivalent Booster 43yrs & up 11/22/2021   Pneumococcal Polysaccharide-23 08/20/2015, 04/29/2022   Unspecified SARS-COV-2 Vaccination 07/21/2019, 08/11/2019   Zoster Recombinant(Shingrix) 04/29/2022   Pertinent  Health Maintenance Due  Topic Date Due   INFLUENZA VACCINE  01/15/2023   HEMOGLOBIN A1C  05/02/2023   OPHTHALMOLOGY EXAM  08/25/2023   FOOT  EXAM  12/09/2023   DEXA SCAN  Completed      11/28/2021    7:57 PM 11/29/2021    7:59 AM 12/18/2021   12:34 PM 08/14/2022   11:35 AM 10/23/2022    2:27 PM  Fall Risk  Falls in the past year?   0 0 0  Was there an injury with Fall?   0 0   Fall Risk Category Calculator   0 0   Fall Risk Category (Retired)   Low    (RETIRED) Patient Fall Risk Level High fall risk High fall risk Moderate fall risk    Patient at Risk for Falls Due to   Impaired mobility;Impaired balance/gait No Fall Risks   Fall risk Follow up   Falls evaluation completed;Education provided;Falls prevention discussed Falls evaluation completed    Functional Status Survey:    Vitals:   02/17/23 0924  BP: 134/72  Pulse: 87  Resp: 20  Temp: 97.7 F (36.5 C)  SpO2: 93%  Weight: 116 lb 12.8 oz (53 kg)  Height: 4\' 9"  (1.448 m)   Body mass index is  25.28 kg/m. Physical Exam Constitutional:      General: She is not in acute distress.    Appearance: She is well-developed. She is not diaphoretic.  HENT:     Head: Normocephalic and atraumatic.     Mouth/Throat:     Pharynx: No oropharyngeal exudate.  Eyes:     Conjunctiva/sclera: Conjunctivae normal.     Pupils: Pupils are equal, round, and reactive to light.  Cardiovascular:     Rate and Rhythm: Normal rate and regular rhythm.     Heart sounds: Normal heart sounds.  Pulmonary:     Effort: Pulmonary effort is normal.     Comments: Breath sounds diminished increase RR with activity Abdominal:     General: Bowel sounds are normal.     Palpations: Abdomen is soft.  Musculoskeletal:     Cervical back: Normal range of motion and neck supple.     Right lower leg: No edema.     Left lower leg: No edema.  Skin:    General: Skin is warm and dry.  Neurological:     Mental Status: She is alert.  Psychiatric:        Mood and Affect: Mood normal.     Labs reviewed: Recent Labs    08/18/22 0000  NA 141  K 3.5  CL 104  CO2 28*  BUN 29*  CREATININE 1.3*  CALCIUM 8.8   Recent Labs    08/18/22 0000  AST 28  ALT 21  ALKPHOS 89  ALBUMIN 3.7   Recent Labs    08/18/22 0000 09/01/22 0000 09/11/22 0000 01/08/23 0000 02/05/23 0000  WBC 5.5 7.5 6.3 6.0 6.5  NEUTROABS 3,746.00 5,363.00  --   --  4,381.00  HGB 13.6 13.5 13.5 13.2 13.7  HCT 42 40 41 42 44  PLT 124* 146* 147* 147* 163   Lab Results  Component Value Date   TSH 2.925 11/18/2021   Lab Results  Component Value Date   HGBA1C 6.5 10/30/2022   Lab Results  Component Value Date   CHOL 146 07/17/2022   HDL 38 07/17/2022   LDLCALC 77 07/17/2022   TRIG 226 (A) 07/17/2022   CHOLHDL 7.7 11/27/2021    Significant Diagnostic Results in last 30 days:  No results found.  Assessment/Plan 1. COVID-19 -has completed molnupiravir  -continues to have cough and congestion Mucinex DM by mouth  BID for 5 days  with full glass of water scheduled Encouraged to sit up with deep breathing Continue O2 for support  Due to positive covid they are not allowed to do neb treatments at this time but she can use inhaler PRN shortness of breath/wheezing Continue close monitoring and notify PRN.    Janene Harvey. [facility] [phone]

## 2023-02-19 LAB — CBC AND DIFFERENTIAL
HCT: 40 (ref 36–46)
Hemoglobin: 12.5 (ref 12.0–16.0)
Neutrophils Absolute: 7776
Platelets: 222 10*3/uL (ref 150–400)
WBC: 9.6

## 2023-02-19 LAB — CBC: RBC: 4.7 (ref 3.87–5.11)

## 2023-02-19 LAB — BASIC METABOLIC PANEL
BUN: 36 — AB (ref 4–21)
CO2: 23 — AB (ref 13–22)
Chloride: 101 (ref 99–108)
Creatinine: 1.4 — AB (ref 0.5–1.1)
Glucose: 157
Potassium: 3.7 meq/L (ref 3.5–5.1)
Sodium: 136 — AB (ref 137–147)

## 2023-02-19 LAB — COMPREHENSIVE METABOLIC PANEL
Calcium: 9 (ref 8.7–10.7)
eGFR: 37

## 2023-02-22 ENCOUNTER — Inpatient Hospital Stay
Admission: EM | Admit: 2023-02-22 | Discharge: 2023-02-25 | DRG: 193 | Disposition: A | Discharge status: Skilled Nursing Facility | Payer: Medicare Other | Source: Skilled Nursing Facility | Attending: Internal Medicine | Admitting: Internal Medicine

## 2023-02-22 ENCOUNTER — Telehealth: Payer: Medicare Other | Admitting: Adult Health

## 2023-02-22 ENCOUNTER — Other Ambulatory Visit: Payer: Self-pay

## 2023-02-22 ENCOUNTER — Emergency Department: Payer: Medicare Other

## 2023-02-22 DIAGNOSIS — J9621 Acute and chronic respiratory failure with hypoxia: Secondary | ICD-10-CM | POA: Diagnosis present

## 2023-02-22 DIAGNOSIS — I5033 Acute on chronic diastolic (congestive) heart failure: Secondary | ICD-10-CM | POA: Diagnosis present

## 2023-02-22 DIAGNOSIS — Z7984 Long term (current) use of oral hypoglycemic drugs: Secondary | ICD-10-CM

## 2023-02-22 DIAGNOSIS — Z9981 Dependence on supplemental oxygen: Secondary | ICD-10-CM

## 2023-02-22 DIAGNOSIS — I5023 Acute on chronic systolic (congestive) heart failure: Secondary | ICD-10-CM | POA: Diagnosis present

## 2023-02-22 DIAGNOSIS — I2489 Other forms of acute ischemic heart disease: Secondary | ICD-10-CM | POA: Diagnosis present

## 2023-02-22 DIAGNOSIS — U071 COVID-19: Principal | ICD-10-CM | POA: Diagnosis present

## 2023-02-22 DIAGNOSIS — N390 Urinary tract infection, site not specified: Secondary | ICD-10-CM | POA: Diagnosis present

## 2023-02-22 DIAGNOSIS — J189 Pneumonia, unspecified organism: Principal | ICD-10-CM | POA: Diagnosis present

## 2023-02-22 DIAGNOSIS — A419 Sepsis, unspecified organism: Secondary | ICD-10-CM | POA: Diagnosis not present

## 2023-02-22 DIAGNOSIS — Z66 Do not resuscitate: Secondary | ICD-10-CM | POA: Diagnosis present

## 2023-02-22 DIAGNOSIS — Z1152 Encounter for screening for COVID-19: Secondary | ICD-10-CM

## 2023-02-22 DIAGNOSIS — Z7901 Long term (current) use of anticoagulants: Secondary | ICD-10-CM | POA: Diagnosis not present

## 2023-02-22 DIAGNOSIS — E785 Hyperlipidemia, unspecified: Secondary | ICD-10-CM | POA: Diagnosis present

## 2023-02-22 DIAGNOSIS — L89151 Pressure ulcer of sacral region, stage 1: Secondary | ICD-10-CM | POA: Diagnosis present

## 2023-02-22 DIAGNOSIS — J440 Chronic obstructive pulmonary disease with acute lower respiratory infection: Secondary | ICD-10-CM | POA: Diagnosis present

## 2023-02-22 DIAGNOSIS — I420 Dilated cardiomyopathy: Secondary | ICD-10-CM | POA: Diagnosis present

## 2023-02-22 DIAGNOSIS — E1122 Type 2 diabetes mellitus with diabetic chronic kidney disease: Secondary | ICD-10-CM | POA: Diagnosis present

## 2023-02-22 DIAGNOSIS — R339 Retention of urine, unspecified: Secondary | ICD-10-CM | POA: Diagnosis present

## 2023-02-22 DIAGNOSIS — E039 Hypothyroidism, unspecified: Secondary | ICD-10-CM | POA: Diagnosis present

## 2023-02-22 DIAGNOSIS — J069 Acute upper respiratory infection, unspecified: Secondary | ICD-10-CM | POA: Diagnosis not present

## 2023-02-22 DIAGNOSIS — Z7983 Long term (current) use of bisphosphonates: Secondary | ICD-10-CM

## 2023-02-22 DIAGNOSIS — Z881 Allergy status to other antibiotic agents status: Secondary | ICD-10-CM

## 2023-02-22 DIAGNOSIS — Z888 Allergy status to other drugs, medicaments and biological substances status: Secondary | ICD-10-CM

## 2023-02-22 DIAGNOSIS — M810 Age-related osteoporosis without current pathological fracture: Secondary | ICD-10-CM | POA: Diagnosis present

## 2023-02-22 DIAGNOSIS — E119 Type 2 diabetes mellitus without complications: Secondary | ICD-10-CM

## 2023-02-22 DIAGNOSIS — U099 Post covid-19 condition, unspecified: Secondary | ICD-10-CM | POA: Diagnosis present

## 2023-02-22 DIAGNOSIS — I480 Paroxysmal atrial fibrillation: Secondary | ICD-10-CM | POA: Diagnosis present

## 2023-02-22 DIAGNOSIS — K219 Gastro-esophageal reflux disease without esophagitis: Secondary | ICD-10-CM | POA: Diagnosis present

## 2023-02-22 DIAGNOSIS — Z87891 Personal history of nicotine dependence: Secondary | ICD-10-CM | POA: Diagnosis not present

## 2023-02-22 DIAGNOSIS — L8990 Pressure ulcer of unspecified site, unspecified stage: Secondary | ICD-10-CM | POA: Diagnosis present

## 2023-02-22 DIAGNOSIS — N184 Chronic kidney disease, stage 4 (severe): Secondary | ICD-10-CM | POA: Diagnosis present

## 2023-02-22 DIAGNOSIS — I10 Essential (primary) hypertension: Secondary | ICD-10-CM | POA: Diagnosis present

## 2023-02-22 DIAGNOSIS — Z880 Allergy status to penicillin: Secondary | ICD-10-CM

## 2023-02-22 DIAGNOSIS — N1831 Chronic kidney disease, stage 3a: Secondary | ICD-10-CM | POA: Diagnosis present

## 2023-02-22 DIAGNOSIS — I130 Hypertensive heart and chronic kidney disease with heart failure and stage 1 through stage 4 chronic kidney disease, or unspecified chronic kidney disease: Secondary | ICD-10-CM | POA: Diagnosis present

## 2023-02-22 DIAGNOSIS — I482 Chronic atrial fibrillation, unspecified: Secondary | ICD-10-CM | POA: Diagnosis present

## 2023-02-22 DIAGNOSIS — Z8249 Family history of ischemic heart disease and other diseases of the circulatory system: Secondary | ICD-10-CM

## 2023-02-22 DIAGNOSIS — Z79890 Hormone replacement therapy (postmenopausal): Secondary | ICD-10-CM

## 2023-02-22 DIAGNOSIS — J9611 Chronic respiratory failure with hypoxia: Secondary | ICD-10-CM | POA: Diagnosis present

## 2023-02-22 DIAGNOSIS — Z79899 Other long term (current) drug therapy: Secondary | ICD-10-CM

## 2023-02-22 DIAGNOSIS — J1282 Pneumonia due to coronavirus disease 2019: Secondary | ICD-10-CM | POA: Diagnosis not present

## 2023-02-22 LAB — LACTIC ACID, PLASMA: Lactic Acid, Venous: 1.7 mmol/L (ref 0.5–1.9)

## 2023-02-22 LAB — COMPREHENSIVE METABOLIC PANEL
ALT: 15 U/L (ref 0–44)
AST: 20 U/L (ref 15–41)
Albumin: 3.8 g/dL (ref 3.5–5.0)
Alkaline Phosphatase: 91 U/L (ref 38–126)
Anion gap: 10 (ref 5–15)
BUN: 26 mg/dL — ABNORMAL HIGH (ref 8–23)
CO2: 25 mmol/L (ref 22–32)
Calcium: 8.7 mg/dL — ABNORMAL LOW (ref 8.9–10.3)
Chloride: 100 mmol/L (ref 98–111)
Creatinine, Ser: 1.04 mg/dL — ABNORMAL HIGH (ref 0.44–1.00)
GFR, Estimated: 52 mL/min — ABNORMAL LOW (ref >60)
Glucose, Bld: 145 mg/dL — ABNORMAL HIGH (ref 70–99)
Potassium: 3.7 mmol/L (ref 3.5–5.1)
Sodium: 135 mmol/L (ref 135–145)
Total Bilirubin: 1.1 mg/dL (ref 0.3–1.2)
Total Protein: 7.9 g/dL (ref 6.5–8.1)

## 2023-02-22 LAB — URINALYSIS, ROUTINE W REFLEX MICROSCOPIC
Bilirubin Urine: NEGATIVE
Glucose, UA: >=500 mg/dL — AB
Ketones, ur: NEGATIVE mg/dL
Nitrite: NEGATIVE
Protein, ur: 30 mg/dL — AB
Specific Gravity, Urine: 1.007 (ref 1.005–1.030)
Squamous Epithelial / HPF: NONE SEEN /HPF (ref 0–5)
WBC, UA: >50 WBC/hpf (ref 0–5)
pH: 5 (ref 5.0–8.0)

## 2023-02-22 LAB — CBC
HCT: 47 % — ABNORMAL HIGH (ref 36.0–46.0)
Hemoglobin: 14.7 g/dL (ref 12.0–15.0)
MCH: 26.5 pg (ref 26.0–34.0)
MCHC: 31.3 g/dL (ref 30.0–36.0)
MCV: 84.7 fL (ref 80.0–100.0)
Platelets: 249 10*3/uL (ref 150–400)
RBC: 5.55 MIL/uL — ABNORMAL HIGH (ref 3.87–5.11)
RDW: 16.4 % — ABNORMAL HIGH (ref 11.5–15.5)
WBC: 15 10*3/uL — ABNORMAL HIGH (ref 4.0–10.5)
nRBC: 0 % (ref 0.0–0.2)

## 2023-02-22 LAB — CBG MONITORING, ED
Glucose-Capillary: 137 mg/dL — ABNORMAL HIGH (ref 70–99)
Glucose-Capillary: 117 mg/dL — ABNORMAL HIGH (ref 70–99)
Glucose-Capillary: 101 mg/dL — ABNORMAL HIGH (ref 70–99)

## 2023-02-22 LAB — BRAIN NATRIURETIC PEPTIDE: B Natriuretic Peptide: 746.2 pg/mL — ABNORMAL HIGH (ref 0.0–100.0)

## 2023-02-22 LAB — TROPONIN I (HIGH SENSITIVITY)
Troponin I (High Sensitivity): 49 ng/L — ABNORMAL HIGH (ref <18)
Troponin I (High Sensitivity): 53 ng/L — ABNORMAL HIGH (ref <18)

## 2023-02-22 LAB — PROCALCITONIN: Procalcitonin: <0.1 ng/mL

## 2023-02-22 LAB — SARS CORONAVIRUS 2 BY RT PCR: SARS Coronavirus 2 by RT PCR: POSITIVE — AB

## 2023-02-22 MED ORDER — IPRATROPIUM-ALBUTEROL 20-100 MCG/ACT IN AERS
1.0000 | INHALATION_SPRAY | Freq: Four times a day (QID) | RESPIRATORY_TRACT | Status: DC
  Administered 2023-02-22 – 2023-02-25 (×9): 1 via RESPIRATORY_TRACT
  Filled 2023-02-22: qty 4

## 2023-02-22 MED ORDER — INSULIN ASPART 100 UNIT/ML IJ SOLN
0.0000 [IU] | Freq: Three times a day (TID) | INTRAMUSCULAR | Status: DC
  Administered 2023-02-23 – 2023-02-24 (×3): 1 [IU] via SUBCUTANEOUS
  Filled 2023-02-22 (×5): qty 1

## 2023-02-22 MED ORDER — SODIUM CHLORIDE 0.9 % IV BOLUS: 1000.0000 mL | Freq: Once | INTRAVENOUS | Status: DC

## 2023-02-22 MED ORDER — IPRATROPIUM-ALBUTEROL 0.5-2.5 (3) MG/3ML IN SOLN: 3.0000 mL | RESPIRATORY_TRACT | Status: DC

## 2023-02-22 MED ORDER — ONDANSETRON HCL 4 MG/2ML IJ SOLN: 4.0000 mg | Freq: Four times a day (QID) | INTRAMUSCULAR | Status: DC | PRN

## 2023-02-22 MED ORDER — GUAIFENESIN ER 600 MG PO TB12
600.0000 mg | ORAL_TABLET | Freq: Two times a day (BID) | ORAL | Status: DC
  Administered 2023-02-22 – 2023-02-25 (×7): 600 mg via ORAL
  Filled 2023-02-22 (×7): qty 1

## 2023-02-22 MED ORDER — PANTOPRAZOLE SODIUM 40 MG PO TBEC
40.0000 mg | DELAYED_RELEASE_TABLET | Freq: Two times a day (BID) | ORAL | Status: DC
  Administered 2023-02-22 – 2023-02-25 (×7): 40 mg via ORAL
  Filled 2023-02-22 (×7): qty 1

## 2023-02-22 MED ORDER — VANCOMYCIN HCL IN DEXTROSE 1-5 GM/200ML-% IV SOLN
1000.0000 mg | Freq: Once | INTRAVENOUS | Status: AC
  Administered 2023-02-22: 1000 mg via INTRAVENOUS
  Filled 2023-02-22: qty 200

## 2023-02-22 MED ORDER — TORSEMIDE 20 MG PO TABS
20.0000 mg | ORAL_TABLET | Freq: Every day | ORAL | Status: DC
  Administered 2023-02-23 – 2023-02-25 (×3): 20 mg via ORAL
  Filled 2023-02-22 (×3): qty 1

## 2023-02-22 MED ORDER — EMPAGLIFLOZIN 10 MG PO TABS
10.0000 mg | ORAL_TABLET | Freq: Every day | ORAL | Status: DC
  Administered 2023-02-22 – 2023-02-25 (×4): 10 mg via ORAL
  Filled 2023-02-22 (×5): qty 1

## 2023-02-22 MED ORDER — SERTRALINE HCL 50 MG PO TABS
50.0000 mg | ORAL_TABLET | Freq: Every day | ORAL | Status: DC
  Administered 2023-02-22 – 2023-02-25 (×4): 50 mg via ORAL
  Filled 2023-02-22 (×4): qty 1

## 2023-02-22 MED ORDER — APIXABAN 2.5 MG PO TABS
2.5000 mg | ORAL_TABLET | Freq: Two times a day (BID) | ORAL | Status: DC
  Administered 2023-02-22 – 2023-02-25 (×7): 2.5 mg via ORAL
  Filled 2023-02-22 (×9): qty 1

## 2023-02-22 MED ORDER — ATORVASTATIN CALCIUM 80 MG PO TABS
80.0000 mg | ORAL_TABLET | Freq: Every day | ORAL | Status: DC
  Administered 2023-02-24: 80 mg via ORAL
  Filled 2023-02-22 (×2): qty 1

## 2023-02-22 MED ORDER — IPRATROPIUM BROMIDE HFA 17 MCG/ACT IN AERS: 2.0000 | INHALATION_SPRAY | Freq: Four times a day (QID) | RESPIRATORY_TRACT | Status: DC | PRN

## 2023-02-22 MED ORDER — IRBESARTAN 150 MG PO TABS
150.0000 mg | ORAL_TABLET | Freq: Every day | ORAL | Status: DC
  Administered 2023-02-22 – 2023-02-25 (×4): 150 mg via ORAL
  Filled 2023-02-22 (×4): qty 1

## 2023-02-22 MED ORDER — SODIUM CHLORIDE 0.9 % IV SOLN
100.0000 mg | Freq: Two times a day (BID) | INTRAVENOUS | Status: DC
  Administered 2023-02-22 – 2023-02-23 (×3): 100 mg via INTRAVENOUS
  Filled 2023-02-22 (×3): qty 100

## 2023-02-22 MED ORDER — ACETAMINOPHEN 325 MG PO TABS: 650.0000 mg | ORAL_TABLET | ORAL | Status: DC | PRN

## 2023-02-22 MED ORDER — FUROSEMIDE 10 MG/ML IJ SOLN
60.0000 mg | Freq: Once | INTRAMUSCULAR | Status: AC
  Administered 2023-02-22: 60 mg via INTRAVENOUS

## 2023-02-22 MED ORDER — IPRATROPIUM-ALBUTEROL 0.5-2.5 (3) MG/3ML IN SOLN: 3.0000 mL | Freq: Three times a day (TID) | RESPIRATORY_TRACT | Status: DC | PRN

## 2023-02-22 MED ORDER — ONDANSETRON HCL 4 MG PO TABS: 4.0000 mg | ORAL_TABLET | Freq: Four times a day (QID) | ORAL | Status: DC | PRN

## 2023-02-22 MED ORDER — DICLOFENAC SODIUM 1 % EX GEL: 2.0000 g | Freq: Four times a day (QID) | CUTANEOUS | Status: DC | PRN

## 2023-02-22 MED ORDER — SENNOSIDES-DOCUSATE SODIUM 8.6-50 MG PO TABS
2.0000 | ORAL_TABLET | Freq: Every day | ORAL | Status: DC
  Administered 2023-02-22 – 2023-02-25 (×4): 2 via ORAL
  Filled 2023-02-22 (×4): qty 2

## 2023-02-22 MED ORDER — SODIUM CHLORIDE 0.9 % IV SOLN
2.0000 g | INTRAVENOUS | Status: DC
  Administered 2023-02-23: 2 g via INTRAVENOUS
  Filled 2023-02-22 (×2): qty 20

## 2023-02-22 MED ORDER — SODIUM CHLORIDE 0.9 % IV SOLN
2.0000 g | Freq: Once | INTRAVENOUS | Status: AC
  Administered 2023-02-22: 2 g via INTRAVENOUS
  Filled 2023-02-22: qty 12.5

## 2023-02-22 MED ORDER — SODIUM CHLORIDE 0.9 % IV SOLN
2.0000 g | Freq: Once | INTRAVENOUS | Status: DC
  Filled 2023-02-22: qty 10

## 2023-02-22 MED ORDER — POLYETHYLENE GLYCOL 3350 17 G PO PACK
17.0000 g | PACK | ORAL | Status: DC
  Administered 2023-02-24: 17 g via ORAL
  Filled 2023-02-22: qty 1

## 2023-02-22 MED ORDER — LEVOTHYROXINE SODIUM 25 MCG PO TABS
25.0000 ug | ORAL_TABLET | Freq: Every day | ORAL | Status: DC
  Administered 2023-02-23 – 2023-02-25 (×3): 25 ug via ORAL
  Filled 2023-02-22 (×3): qty 1

## 2023-02-22 NOTE — ED Triage Notes (Signed)
Pt arrives via [facility] from [facility] for SOB. Pt recently recovered from Covid. Pt with hx of COPD. SpO2 71% on RA per EMS with rhonchi. Pt received duoneb prior to EMS arrival and arrives to ER with duoneb finishing. SpO2 improved to 93% with intervention. Pt tachynpneic on arrival.

## 2023-02-22 NOTE — ED Notes (Addendum)
This RN to bedside to introduce self to pt. Pt resting comfortably and family at bedside.

## 2023-02-22 NOTE — ED Notes (Signed)
This RN noticed pt airway was still showing she was wearing BiPaP. This RN asked the family when it was removed. They advised it was after breakfast and before lunch. This RN will change documentation.,

## 2023-02-22 NOTE — ED Notes (Signed)
Pt daughter, Theodosia Blender would like to be notified when pt goes upstairs.  [phone]

## 2023-02-22 NOTE — ED Provider Notes (Signed)
[facility] Provider Note    Event Date/Time   First MD Initiated Contact with Patient 02/22/23 [phone]     (approximate)  History   Chief Complaint: Shortness of Breath  HPI  Jacqueline [name] is a 89 y.o. female with a past medical history of CHF, diabetes, hypertension, hyperlipidemia, CKD, reported recent COVID infection presents to the emergency department from a nursing facility with shortness of breath.  According to EMS they were called out to [facility] nursing facility this morning with the patient complaining of shortness of breath.  Patient found to be satting in the 70s on room air with no baseline O2 requirement per EMS.  Patient placed on nonrebreather and brought to the emergency department.  Patient has diffuse rhonchi with occasional wet sounding cough.  No reported fever.  Currently satting 84% tachypneic and tachycardic.  Physical Exam   Triage Vital Signs: ED Triage Vitals  Encounter Vitals Group     BP 02/22/23 0739 (!) 154/91     Systolic BP Percentile --      Diastolic BP Percentile --      Pulse Rate 02/22/23 0739 (!) 126     Resp 02/22/23 0739 (!) 36     Temp 02/22/23 0739 98.9 F (37.2 C)     Temp Source 02/22/23 0739 Axillary     SpO2 02/22/23 0738 (!) 86 %     Weight --      Height --      Head Circumference --      Peak Flow --      Pain Score 02/22/23 0739 0     Pain Loc --      Pain Education --      Exclude from Growth Chart --     Most recent vital signs: Vitals:   02/22/23 0739 02/22/23 0742  BP: (!) 154/91   Pulse: (!) 126   Resp: (!) 36   Temp: 98.9 F (37.2 C)   SpO2: (!) 84% 94%    General: Awake, no distress.  CV:  Good peripheral perfusion.  Regular rate and rhythm  Resp:  Normal effort.  Equal breath sounds bilaterally.  Abd:  No distention.  Soft, nontender.  No rebound or guarding. Other:  Minimal lower extremity edema bilaterally.   ED Results / Procedures / Treatments   EKG  EKG viewed and  interpreted by myself shows sinus tachycardia 121 bpm with a slightly widened QRS, left axis deviation, slight QTc prolongation otherwise normal intervals with nonspecific ST changes  RADIOLOGY  I have reviewed and interpreted chest x-ray images.  Possible edema on chest x-ray images.  Chest x-ray read as negative.   MEDICATIONS ORDERED IN ED: Medications - No data to display   IMPRESSION / MDM / ASSESSMENT AND PLAN / ED COURSE  I reviewed the triage vital signs and the nursing notes.  Patient's presentation is most consistent with acute presentation with potential threat to life or bodily function.  Patient presents emergency department for shortness of breath with diffuse rhonchi on examination.  Patient is tachycardic and tachypneic as well as hypoxic to the 70s on room air, 84% on room air in the emergency department.  We will place the patient on BiPAP.  Will check labs, chest x-ray.  Differential is quite broad at this time but would include CHF exacerbation/pulmonary edema, viral pneumonia/COVID-19, bacterial pneumonia, ACS.  Given patient's vital signs meeting sepsis criteria we will cover with broad-spectrum antibiotics while awaiting further  results.  We will hold off on significant fluid administration given hypertensive blood pressures and concern for possible CHF.  Patient's COVID test is positive.  Family is now here with the patient they state that the patient tested positive for COVID back August 22 but has since tested negative.  I believe would be unlikely that the patient had COVID recovered and had COVID again so quickly it is likely a residual positive result.  The remainder the patient's workup shows a leukocytosis of 15,000 again meeting sepsis criteria.  However patient continues to sound very wet I have reviewed the chest x-ray images possible mild edema.  Will hold off on significant fluid administration given the patient is hypertensive.  We will continue with IV  antibiotics given the leukocytosis, I have added a procalcitonin to help [place] differentiate bacterial from viral infection.  Patient's BNP is also elevated to 750 which is mildly increased from her historic values.  Troponin currently 49 although this is decreased from the last time this was checked a year ago.  Lactic acid is 1.7.  Chemistry shows no significant findings.  I have discussed patient's findings with the family.  Concerning for possible CHF fluid overload versus infection (bacterial versus COVID).  We will dose IV Lasix we we will continue antibiotics.  Patient has a DO NOT RESUSCITATE order.  I spoke to the daughter they have not made any decisions as far as intubation if that would be needed.  Patient remains on BiPAP.  CRITICAL CARE Performed by: Minna Antis   Total critical care time: 45 minutes  Critical care time was exclusive of separately billable procedures and treating other patients.  Critical care was necessary to treat or prevent imminent or life-threatening deterioration.  Critical care was time spent personally by me on the following activities: development of treatment plan with patient and/or surrogate as well as nursing, discussions with consultants, evaluation of patient's response to treatment, examination of patient, obtaining history from patient or surrogate, ordering and performing treatments and interventions, ordering and review of laboratory studies, ordering and review of radiographic studies, pulse oximetry and re-evaluation of patient's condition.   FINAL CLINICAL IMPRESSION(S) / ED DIAGNOSES   Dyspnea Hypoxia Sepsis   Note:  This document was prepared using Dragon voice recognition software and may include unintentional dictation errors.   Minna Antis, MD 02/22/23 [phone]

## 2023-02-22 NOTE — Progress Notes (Signed)
Pt taken off bipap and placed on HFNC at 10L, sats 97%, respiratory rate 28-30. Tolerating well at this time.

## 2023-02-22 NOTE — Consult Note (Signed)
PHARMACY -  BRIEF ANTIBIOTIC NOTE   Pharmacy has received consult(s) for vancomycin and aztreonam from an ED provider.  The patient's profile has been reviewed for ht/wt/allergies/indication/available labs.   Per consult instructions, aztreonam changed to cefepime given documented history of use of cephalosporins (ceftriaxone) previously. Patient with reported PCN / cefuroxime allergies (low severity, rash). Do not anticipate issue with cross reactivity since cefepime does not shared side chain w/ cefuroxime or PCN.  One time order(s) placed for  --Vancomycin 1 g IV --Cefepime 2 g IV  Further antibiotics/pharmacy consults should be ordered by admitting physician if indicated.                       Thank you, Tressie Ellis 02/22/2023  8:10 AM

## 2023-02-22 NOTE — Progress Notes (Signed)
[facility]link following for sepsis protocol. 

## 2023-02-22 NOTE — ED Notes (Signed)
RT at bedside to place BiPap.

## 2023-02-22 NOTE — Telephone Encounter (Signed)
Nurse called to report pt is having worsening sob and sats are 78% on 4 liters of oxygen. She has had covid and is not improving. Completed molnupiravir.  Nurse requesting to send pt to the ER. May need dexamethasone or solu medrol. Order given to send to the ER for further evaluation.

## 2023-02-22 NOTE — ED Notes (Signed)
Advised nurse that patient has ready bed 

## 2023-02-22 NOTE — H&P (Addendum)
History and Physical    Jacqueline [name] FAO:[id number] DOB: 06-03-1933 DOA: 02/22/2023  PCP: Earnestine Mealing, MD (Confirm with patient/family/NH records and if not entered, this has to be entered at [facility] point of entry) Patient coming from: SNF  I have personally briefly reviewed patient's old medical records in [facility]  Chief Complaint: Cough, SOB  HPI: Jacqueline [name] is a 89 y.o. female with medical history significant of chronic HFrEF with LVEF 40-45%, PAF on Eliquis, COPD, chronic hypoxic respiratory failure on 4 L continuously, CKD stage II, IIDM, hypothyroidism, sent from nursing home for evaluation of worsening of cough and shortness of breath.  Patient was tested positive for COVID on August 22 on the routine COVID screening, for which she received Paxlovid x 5 days.  Last Saturday-Sunday, patient developed a productive cough with whitish phlegm however patient was weak and unable to cough up much of the plan.  Along with worsening of hypoxia required 5 L instead of 4 L as her baseline.  Last 3 to 4 days patient also has had poor appetite with decreased oral intake and family also suspect the patient had a new UTI as patient started complaining about bladder spasm and burning sensation when urinate.  No fever or chills.  This morning patient also Serrone dropped to 70s and nursing home called EMS. ED Course: Tachycardia tachypneic blood pressure elevated O2 saturation 84% on room air and placed on BiPAP.  X-ray showed no seemingly obvious infiltrates.  Prob-BNP>700, patient was given IV antibiotics and Lasix 60 mg IV x 1  Nursing also reported patient has acute urinary retention with bladder scan> 600 ml.  Review of Systems: As per HPI otherwise 14 point review of systems negative.    Past Medical History:  Diagnosis Date   Acute exacerbation of CHF (congestive heart failure) (HCC) 11/18/2021   AKI (acute kidney injury) (HCC) 11/26/2021   Anemia    CHF (congestive heart  failure) (HCC)    Diabetes mellitus without complication (HCC) 03/13/2017   Currently no high BS.  Has come off meds.  But having episodes of low BS now.   Dilated idiopathic cardiomyopathy (HCC)    Dizziness 02/18/2017   GERD (gastroesophageal reflux disease)    Hyperkalemia 11/26/2021   Hyperlipidemia    Hypertension    Hyponatremia 06/09/2017   Hypothyroidism    Osteoporosis    Renal disorder    stage 3    Past Surgical History:  Procedure Laterality Date   BUNIONECTOMY     COLONOSCOPY     ESOPHAGOGASTRODUODENOSCOPY (EGD) WITH PROPOFOL N/A 10/15/2017   Procedure: ESOPHAGOGASTRODUODENOSCOPY (EGD) WITH PROPOFOL;  Surgeon: Christena Deem, MD;  [facility]: [facility] END[facility];  Service: Endoscopy;  Laterality: N/A;   KYPHOPLASTY N/A 06/12/2017   Procedure: [id number];  Surgeon: Kennedy Bucker, MD;  Location: [facility] ORS;  Service: Orthopedics;  Laterality: N/A;   TONSILLECTOMY       reports that she quit smoking about 38 years ago. Her smoking use included cigarettes. She has never used smokeless tobacco. She reports that she does not drink alcohol and does not use drugs.  Allergies  Allergen Reactions   Ciprofloxacin    Digoxin And Related    Entresto [Sacubitril-Valsartan] Other (See Comments)    Hypotensive    Erythromycin    Lopid [Gemfibrozil]    Nitrofurantoin    Spironolactone Other (See Comments)    Hospitalized for dehydration   Cefuroxime Rash   Penicillins Rash    Has patient had a  PCN reaction causing immediate rash, facial/tongue/throat swelling, SOB or lightheadedness with hypotension: Unknown Has patient had a PCN reaction causing severe rash involving mucus membranes or skin necrosis: No Has patient had a PCN reaction that required hospitalization: No Has patient had a PCN reaction occurring within the last 10 years: No If all of the above answers are "NO", then may proceed with Cephalosporin use.     Family History  Problem Relation Age of Onset    Premature CHD Brother      Prior to Admission medications   Medication Sig Start Date End Date Taking? Authorizing Provider  acetaminophen (TYLENOL) 325 MG tablet Take 2 tablets (650 mg total) by mouth every 4 (four) hours as needed for headache or mild pain. 11/29/21   Pennie Banter, DO  alendronate (FOSAMAX) 70 MG tablet Take 70 mg by mouth once a week. Take with a full glass of water on an empty stomach.    [provider]  apixaban (ELIQUIS) 2.5 MG TABS tablet Take 1 tablet (2.5 mg total) by mouth 2 (two) times daily. Start on 11/30/2021 11/29/21   Pennie Banter, DO  ART[employer] OP Apply 1 drop to eye 2 (two) times daily. Both Eyes.    [provider]  atorvastatin (LIPITOR) 80 MG tablet Take 1 tablet (80 mg total) by mouth daily. 11/30/21   Esaw Grandchild A, DO  calcium elemental as carbonate (TUMS ULTRA 1000) 400 MG chewable tablet Chew 1,000 mg by mouth 3 (three) times daily as needed for heartburn.    [provider]  diclofenac Sodium (VOLTAREN) 1 % GEL Apply 2 g topically every 6 (six) hours as needed. Apply to back topically every shift for pain.    [provider]  ergocalciferol (VITAMIN D2) 50000 units capsule Take 50,000 Units by mouth once a week. Monday    [provider]  ipratropium-albuterol (DUONEB) 0.5-2.5 (3) MG/3ML SOLN Take 3 mLs by nebulization 3 (three) times daily as needed. 08/30/22   Fletcher Anon, NP  JARDIANCE 10 MG TABS tablet Take 10 mg by mouth daily. 09/23/21   [provider]  levothyroxine (SYNTHROID, LEVOTHROID) 25 MCG tablet Take 25 mcg by mouth daily before breakfast.    [provider]  magnesium hydroxide (MILK OF MAGNESIA) 400 MG/5ML suspension Take 30 mLs by mouth daily as needed for mild constipation.    [provider]  OXYGEN 4lpm via [place] for SOB continuous.    [provider]  pantoprazole (PROTONIX) 40 MG tablet Take 40 mg by mouth in the morning and  at bedtime.    [provider]  polyethylene glycol (MIRALAX / GLYCOLAX) 17 g packet Take 17 g by mouth every other day.    [provider]  senna-docusate (SENOKOT-S) 8.6-50 MG tablet Take 2 tablets by mouth daily.    [provider]  sertraline (ZOLOFT) 50 MG tablet Take 50 mg by mouth daily.    [provider]  torsemide (DEMADEX) 10 MG tablet Take 20 mg by mouth daily. 11/27/21   [provider]  valsartan (DIOVAN) 80 MG tablet Take 160 mg by mouth daily.    [provider]    Physical Exam: Vitals:   02/22/23 0830 02/22/23 0900 02/22/23 1000 02/22/23 1030  BP: (!) 144/94 (!) 170/88 133/70 120/72  Pulse: (!) 113 (!) 116 97 98  Resp: (!) 29 (!) 31 (!) 25 (!) 28  Temp:      TempSrc:  SpO2: 97% 96% 98% 100%    Constitutional: NAD, calm, comfortable Vitals:   02/22/23 0830 02/22/23 0900 02/22/23 1000 02/22/23 1030  BP: (!) 144/94 (!) 170/88 133/70 120/72  Pulse: (!) 113 (!) 116 97 98  Resp: (!) 29 (!) 31 (!) 25 (!) 28  Temp:      TempSrc:      SpO2: 97% 96% 98% 100%   Eyes: PERRL, lids and conjunctivae normal ENMT: Mucous membranes are moist. Posterior pharynx clear of any exudate or lesions.Normal dentition.  Neck: normal, supple, no masses, no thyromegaly Respiratory: clear to auscultation bilaterally, no wheezing, coarse crackles bilaterally, increasing respiratory effort. No accessory muscle use.  Cardiovascular: Regular rate and rhythm, no murmurs / rubs / gallops. No extremity edema. 2+ pedal pulses. No carotid bruits.  Abdomen: no tenderness, no masses palpated. No hepatosplenomegaly. Bowel sounds positive.  Musculoskeletal: no clubbing / cyanosis. No joint deformity upper and lower extremities. Good ROM, no contractures. Normal muscle tone.  Skin: no rashes, lesions, ulcers. No induration Neurologic: CN 2-12 grossly intact. Sensation intact, DTR normal. Strength 5/5 in all 4.  Psychiatric: Normal judgment and  insight. Alert and oriented x 3. Normal mood.     Labs on Admission: I have personally reviewed following labs and imaging studies  CBC: Recent Labs  Lab 02/22/23 0745  WBC 15.0*  HGB 14.7  HCT 47.0*  MCV 84.7  PLT 249   Basic Metabolic Panel: Recent Labs  Lab 02/22/23 0745  NA 135  K 3.7  CL 100  CO2 25  GLUCOSE 145*  BUN 26*  CREATININE 1.04*  CALCIUM 8.7*   GFR: Estimated Creatinine Clearance: 26.2 mL/min (A) (by C-G formula based on SCr of 1.04 mg/dL (H)). Liver Function Tests: Recent Labs  Lab 02/22/23 0745  AST 20  ALT 15  ALKPHOS 91  BILITOT 1.1  PROT 7.9  ALBUMIN 3.8   No results for input(s): "LIPASE", "AMYLASE" in the last 168 hours. No results for input(s): "AMMONIA" in the last 168 hours. Coagulation Profile: No results for input(s): "INR", "PROTIME" in the last 168 hours. Cardiac Enzymes: No results for input(s): "CKTOTAL", "CKMB", "CKMBINDEX", "TROPONINI" in the last 168 hours. BNP (last 3 results) No results for input(s): "PROBNP" in the last 8760 hours. HbA1C: No results for input(s): "HGBA1C" in the last 72 hours. CBG: No results for input(s): "GLUCAP" in the last 168 hours. Lipid Profile: No results for input(s): "CHOL", "HDL", "LDLCALC", "TRIG", "CHOLHDL", "LDLDIRECT" in the last 72 hours. Thyroid Function Tests: No results for input(s): "TSH", "T4TOTAL", "FREET4", "T3FREE", "THYROIDAB" in the last 72 hours. Anemia Panel: No results for input(s): "VITAMINB12", "FOLATE", "FERRITIN", "TIBC", "IRON", "RETICCTPCT" in the last 72 hours. Urine analysis:    Component Value Date/Time   COLORURINE YELLOW (A) 02/22/2023 0745   APPEARANCEUR TURBID (A) 02/22/2023 0745   APPEARANCEUR Clear 06/13/2014 2201   LABSPEC 1.007 02/22/2023 0745   LABSPEC 1.008 06/13/2014 2201   PHURINE 5.0 02/22/2023 0745   GLUCOSEU >=500 (A) 02/22/2023 0745   GLUCOSEU 150 mg/dL 16/03/9603 5409   HGBUR MODERATE (A) 02/22/2023 0745   BILIRUBINUR NEGATIVE 02/22/2023  0745   BILIRUBINUR Negative 06/13/2014 2201   KETONESUR NEGATIVE 02/22/2023 0745   PROTEINUR 30 (A) 02/22/2023 0745   NITRITE NEGATIVE 02/22/2023 0745   LEUKOCYTESUR LARGE (A) 02/22/2023 0745   LEUKOCYTESUR Trace 06/13/2014 2201    Radiological Exams on Admission: DG Chest Portable 1 View  Result Date: 02/22/2023 CLINICAL DATA:  89 year old female with history of shortness of breath.  EXAM: PORTABLE CHEST 1 VIEW COMPARISON:  Chest x-ray 11/26/2021. FINDINGS: Lung volumes are normal. No consolidative airspace disease. No pleural effusions. No pneumothorax. No pulmonary nodule or mass noted. Pulmonary vasculature and the cardiomediastinal silhouette are within normal limits. Atherosclerosis in the thoracic aorta. Sclerotic lesion in the left proximal humerus, similar to prior studies, likely an enchondroma. IMPRESSION: 1.  No radiographic evidence of acute cardiopulmonary disease. 2. Aortic atherosclerosis. Electronically Signed   By: Trudie Reed M.D.   On: 02/22/2023 08:36    EKG: Ordered  Assessment/Plan Principal Problem:   PNA (pneumonia) Active Problems:   Acute on chronic systolic CHF (congestive heart failure) (HCC)   CAP (community acquired pneumonia)  (please populate well all problems here in Problem List. (For example, if patient is on BP meds at home and you resume or decide to hold them, it is a problem that needs to be her. Same for CAD, COPD, HLD and so on)  Acute on chronic hypoxic respiratory failure -Clinically suspect postviral pneumonia, bacterial, continue antibiotics coverage ceftriaxone and azithromycin -CHF decompensation cannot be completely ruled out at this point and patient did receive 60 mg of IV Lasix in the ED.  Family reported that the patient kidney function 10 to serge with higher dose of diuresis, will hold off IV diuresis today and recheck kidney function and x-ray tomorrow to decide further diuresis plan. -Sputum culture atypical pneumonia  study -Breathing treatment -Incentive spirometry and flutter valve -Admit to PCU for weaning off BIPAP  Acute on chronic HFrEF decompensation -Patient was tachycardia and hypertensive on arrival however clinically appears to be mild volume overloaded-euvolemic and received 1 dose of IV Lasix in the ED -Recheck kidney function and chest x-ray, resume p.o. torsemide tomorrow  UTI -Check urine culture -Cover with ceftriaxone  Acute urinary retention -Foley was placed in, expect void trial and discontinue Foley in 1 to 2 days.  Elevated troponins -No chest pain, Trop elevation 49>53, pattern is flat, likely demanding ischemia from hypoxia, ACS unlikely.  COPD -Stable, continue LABA and ICS  PAF -In sinus rhythm, continue Eliquis  IIDM -SSI for now  CKD stage IV -Mild fluid overloaded-euvolemic, 1 dose of IV Lasix and follow-up chest x-ray tomorrow  Hypothyroidism -Continue Synthroid  Deconditioning -PT evaluation  DVT prophylaxis: Eliquis Code Status: DNR Family Communication: Daughter at bedside Disposition Plan: Patient is sick with postviral pneumonia requiring BiPAP, IV antibiotics, expect more than 2 midnight hospital stay. Consults called: None Admission status: PCU   Emeline General MD [facility] Pager [phone]  02/22/2023, 10:53 AM

## 2023-02-22 NOTE — Consult Note (Signed)
CODE SEPSIS - PHARMACY COMMUNICATION  **Broad Spectrum Antibiotics should be administered within 1 hour of Sepsis diagnosis**  Time Code Sepsis Called/Page Received: 1308  Antibiotics Ordered: Cefepime + vancomycin  Time of 1st antibiotic administration: 0815  Additional action taken by pharmacy: N/A  Tressie Ellis 02/22/2023  8:14 AM

## 2023-02-23 ENCOUNTER — Encounter: Payer: Self-pay | Admitting: Internal Medicine

## 2023-02-23 DIAGNOSIS — A419 Sepsis, unspecified organism: Secondary | ICD-10-CM

## 2023-02-23 DIAGNOSIS — I5023 Acute on chronic systolic (congestive) heart failure: Secondary | ICD-10-CM

## 2023-02-23 DIAGNOSIS — U071 COVID-19: Secondary | ICD-10-CM | POA: Diagnosis not present

## 2023-02-23 DIAGNOSIS — L8990 Pressure ulcer of unspecified site, unspecified stage: Secondary | ICD-10-CM | POA: Diagnosis present

## 2023-02-23 DIAGNOSIS — J069 Acute upper respiratory infection, unspecified: Secondary | ICD-10-CM | POA: Diagnosis not present

## 2023-02-23 LAB — BASIC METABOLIC PANEL
Anion gap: 10 (ref 5–15)
BUN: 24 mg/dL — ABNORMAL HIGH (ref 8–23)
CO2: 26 mmol/L (ref 22–32)
Calcium: 8.2 mg/dL — ABNORMAL LOW (ref 8.9–10.3)
Chloride: 102 mmol/L (ref 98–111)
Creatinine, Ser: 0.94 mg/dL (ref 0.44–1.00)
GFR, Estimated: 58 mL/min — ABNORMAL LOW (ref >60)
Glucose, Bld: 111 mg/dL — ABNORMAL HIGH (ref 70–99)
Potassium: 3.5 mmol/L (ref 3.5–5.1)
Sodium: 138 mmol/L (ref 135–145)

## 2023-02-23 LAB — GLUCOSE, CAPILLARY: Glucose-Capillary: 122 mg/dL — ABNORMAL HIGH (ref 70–99)

## 2023-02-23 LAB — CBC
HCT: 41.6 % (ref 36.0–46.0)
Hemoglobin: 13.1 g/dL (ref 12.0–15.0)
MCH: 26.2 pg (ref 26.0–34.0)
MCHC: 31.5 g/dL (ref 30.0–36.0)
MCV: 83.2 fL (ref 80.0–100.0)
Platelets: 210 10*3/uL (ref 150–400)
RBC: 5 MIL/uL (ref 3.87–5.11)
RDW: 16.3 % — ABNORMAL HIGH (ref 11.5–15.5)
WBC: 10.9 10*3/uL — ABNORMAL HIGH (ref 4.0–10.5)
nRBC: 0 % (ref 0.0–0.2)

## 2023-02-23 LAB — CBG MONITORING, ED
Glucose-Capillary: 95 mg/dL (ref 70–99)
Glucose-Capillary: 121 mg/dL — ABNORMAL HIGH (ref 70–99)

## 2023-02-23 MED ORDER — BISACODYL 10 MG RE SUPP
10.0000 mg | Freq: Every day | RECTAL | Status: DC | PRN
  Filled 2023-02-23: qty 1

## 2023-02-23 NOTE — TOC Initial Note (Signed)
Transition of Care [facility]) - Initial/Assessment Note    Patient Details  Name: Jacqueline [name] MRN: [id number] Date of Birth: 04/05/1933  [facility]) CM/SW Contact:    Margarito Liner, LCSW Phone Number: 02/23/2023, 12:55 PM  Clinical Narrative:   Patient not fully oriented and on airborne isolation precautions. Per H&P, patient tested positive for COVID on 8/22. Per chart review, daughter [name] is HCPOA. CSW called [name], introduced role, and explained that discharge planning would be discussed. [name] confirmed that patient is a long-term resident at [facility]. She is agreeable to rehab when she returns and stated she was receiving therapy prior to admission. CSW has discussed with [facility]. No further concerns. CSW encouraged patient's daughter to contact CSW as needed. CSW will continue to follow patient and her daughter for support and facilitate return to SNF once medically stable.               Expected Discharge Plan: Skilled Nursing Facility Barriers to Discharge: Continued Medical Work up   Patient Goals and CMS Choice     Choice offered to / list presented to : Adult Children      Expected Discharge Plan and Services     Post Acute Care Choice: Resumption of Svcs/PTA Provider Living arrangements for the past 2 months: Skilled Nursing Facility                                      Prior Living Arrangements/Services Living arrangements for the past 2 months: Skilled Nursing Facility Lives with:: Facility Resident Patient language and need for interpreter reviewed:: Yes Do you feel safe going back to the place where you live?: Yes      Need for Family Participation in Patient Care: Yes (Comment) Care giver support system in place?: Yes (comment)   Criminal Activity/Legal Involvement Pertinent to Current Situation/Hospitalization: No - Comment as needed  Activities of Daily Living Home Assistive Devices/Equipment: Walker  (specify type), Eyeglasses ADL Screening (condition at time of admission) Patient's cognitive ability adequate to safely complete daily activities?: Yes Is the patient deaf or have difficulty hearing?: No Does the patient have difficulty seeing, even when wearing glasses/contacts?: No Does the patient have difficulty concentrating, remembering, or making decisions?: No Patient able to express need for assistance with ADLs?: Yes Does the patient have difficulty dressing or bathing?: Yes Independently performs ADLs?: No Communication: Independent Dressing (OT): Needs assistance Is this a change from baseline?: Pre-admission baseline Grooming: Independent Feeding: Independent Bathing: Needs assistance Is this a change from baseline?: Pre-admission baseline Toileting: Needs assistance Is this a change from baseline?: Pre-admission baseline In/Out Bed: Needs assistance Is this a change from baseline?: Pre-admission baseline Walks in Home: Needs assistance Is this a change from baseline?: Pre-admission baseline Does the patient have difficulty walking or climbing stairs?: Yes Weakness of Legs: Both Weakness of Arms/Hands: None  Permission Sought/Granted Permission sought to share information with : Facility Medical sales representative, Family Supports    Share Information with NAME: Caprice Kluver  Permission granted to share info w AGENCY: [facility]  Permission granted to share info w Relationship: Daughter/HCPOA  Permission granted to share info w Contact Information: [phone]  Emotional Assessment   Attitude/Demeanor/Rapport: Unable to Assess Affect (typically observed): Unable to Assess Orientation: : Oriented to Self, Oriented to Place Alcohol / Substance Use: Not Applicable Psych Involvement: No (comment)  Admission diagnosis:  PNA (pneumonia) [J18.9] Patient Active Problem List   Diagnosis Date Noted   CAP (community acquired pneumonia) 02/22/2023   PNA (pneumonia)  02/22/2023   Hx of myocardial infarction 02/13/2023   Hepatic cirrhosis, unspecified hepatic cirrhosis type, unspecified whether ascites present (HCC) 07/13/2022   Recurrent major depressive disorder, in partial remission (HCC) 07/13/2022   Atherosclerosis of aorta (HCC) 07/13/2022   Hyperlipidemia 04/28/2022   Dilated idiopathic cardiomyopathy (HCC) 02/26/2022   Dependence on supplemental oxygen 02/26/2022   Chronic atrial fibrillation (HCC) 11/27/2021   Non-ST elevation myocardial infarction (NSTEMI), subendocardial infarction, subsequent episode of care (HCC) 11/26/2021   Depression 11/26/2021   Slurred speech 11/26/2021   Weakness 11/22/2021   CKD stage 3 due to type 2 diabetes mellitus (HCC) 11/21/2021   Thrombocytopenia (HCC) 07/01/2018   Chronic respiratory failure with hypoxia (HCC) 07/01/2018   Proteinuria 07/01/2018   Age-related osteoporosis without current pathological fracture 08/12/2017   S/P kyphoplasty 07/16/2017   MI, acute, non ST segment elevation (HCC) 06/01/2017   Acute upper back pain 03/05/2017   Carcinoma of unknown primary (HCC) 03/05/2017   Liver lesion 02/27/2017   Hypertension 02/24/2017   GERD (gastroesophageal reflux disease) 02/24/2017   Acquired hypothyroidism 02/24/2017   Hypoglycemia 02/24/2017   Acute on chronic systolic CHF (congestive heart failure) (HCC) 02/05/2017   Bilateral leg edema 01/12/2017   AMD (age related macular degeneration) 08/20/2015   Type 2 diabetes mellitus without complications (HCC) 08/18/2014   PCP:  Earnestine Mealing, MD Pharmacy:   [facility] DELIVERY - [address], [place] - [address] [place] [address] Phone: [phone] Fax: [phone]  [facility] - [place], [place] - [address] [place] [address] Phone: [phone] Fax: [phone]  [facility] - [place], [place] - [address] [place] [address] Phone: [phone] Fax: [phone]     Social Determinants of Health (SDOH) Social History: SDOH Screenings   Food Insecurity: No Food Insecurity (02/23/2023)  Housing: Low Risk  (02/23/2023)  Transportation Needs: No Transportation Needs (02/23/2023)  Utilities: Not At Risk (02/23/2023)  Depression (PHQ2-9): Low Risk  (12/18/2021)  Tobacco Use: Medium Risk (02/23/2023)   SDOH Interventions:     Readmission Risk Interventions     No data to display

## 2023-02-23 NOTE — Progress Notes (Addendum)
[facility]  - [place] at [facility]   PATIENT NAME: Jacqueline [name]    MR#:  [id number]  DATE OF BIRTH:  1932-09-25  SUBJECTIVE:  daughter at bedside who gives most history. Patient is brought in for increasing shortness of breath and was found to be hypoxic at [facility]. Being treated for CHF and possible secondary bacterial infection in the setting of COVID. Tolerating PO diet better today. Overall daughter feels patient is improving. Has cough. Foley was placed in the ER.   VITALS:  Blood pressure 125/66, pulse 91, temperature 98.1 F (36.7 C), temperature source Oral, resp. rate (!) 28, SpO2 97%.  PHYSICAL EXAMINATION:   GENERAL:  89 y.o.-year-old patient with no acute distress.  LUNGS: Normal breath sounds bilaterally, no wheezing CARDIOVASCULAR: S1, S2 normal. No murmur   ABDOMEN: Soft, nontender, nondistended. Bowel sounds present. FOLEY+ EXTREMITIES: No  edema b/l.    NEUROLOGIC: nonfocal  patient is alert and awake, weak Pressure Injury 02/22/23 Coccyx Stage 1 -  Intact skin with non-blanchable redness of a localized area usually over a bony prominence. (Active)  02/22/23 2000  Location: Coccyx  Location Orientation:   Staging: Stage 1 -  Intact skin with non-blanchable redness of a localized area usually over a bony prominence.  Wound Description (Comments):   Present on Admission: Yes     LABORATORY PANEL:  CBC Recent Labs  Lab 02/23/23 0213  WBC 10.9*  HGB 13.1  HCT 41.6  PLT 210    Chemistries  Recent Labs  Lab 02/22/23 0745 02/23/23 0213  NA 135 138  K 3.7 3.5  CL 100 102  CO2 25 26  GLUCOSE 145* 111*  BUN 26* 24*  CREATININE 1.04* 0.94  CALCIUM 8.7* 8.2*  AST 20  --   ALT 15  --   ALKPHOS 91  --   BILITOT 1.1  --    Cardiac Enzymes No results for input(s): "TROPONINI" in the last 168 hours. RADIOLOGY:  DG Chest Portable 1 View  Result Date: 02/22/2023 CLINICAL DATA:  89 year old female with history of shortness of  breath. EXAM: PORTABLE CHEST 1 VIEW COMPARISON:  Chest x-ray 11/26/2021. FINDINGS: Lung volumes are normal. No consolidative airspace disease. No pleural effusions. No pneumothorax. No pulmonary nodule or mass noted. Pulmonary vasculature and the cardiomediastinal silhouette are within normal limits. Atherosclerosis in the thoracic aorta. Sclerotic lesion in the left proximal humerus, similar to prior studies, likely an enchondroma. IMPRESSION: 1.  No radiographic evidence of acute cardiopulmonary disease. 2. Aortic atherosclerosis. Electronically Signed   By: Trudie Reed M.D.   On: 02/22/2023 08:36    Assessment and Plan  Jacqueline [name] is a 89 y.o. female with medical history significant of chronic HFrEF with LVEF 40-45%, PAF on Eliquis, COPD, chronic hypoxic respiratory failure on 4 L continuously, CKD stage II, IIDM, hypothyroidism, sent from nursing home for evaluation of worsening of cough and shortness of breath.   Patient was tested positive for COVID on August 22 on the routine COVID screening, for which she received Paxlovid x 5 days.  Last Saturday-Sunday, patient developed a productive cough with whitish phlegm however patient was weak and unable to cough up much of the plan.  Along with worsening of hypoxia required 5 L instead of 4 L as her baseline.    Acute on chronic hypoxic respiratory failure -Clinically suspect postviral upper respiratory tract infection, bacterial -- continue antibiotics coverage ceftriaxone and azithromycin -- chest x-ray negative for pneumonia -- Pro calcitonin  less than 0.01 -CHF decompensation cannot be completely ruled out at this point and patient did receive 60 mg of IV Lasix in the ED. ---now on po torsemide. Creat stable --Incentive spirometry and flutter valve  Acute on chronic HFrEF decompensation -Patient was tachycardia and hypertensive on arrival however clinically appears to be mild volume overloaded-euvolemic and received 1 dose of IV Lasix  in the ED - resume p.o. torsemide   UTI -Check urine culture -Cover with ceftriaxone   Acute urinary retention -Foley was placed in, expect void trial and discontinue Foley in 1 to 2 days. --bladder scan 02/22/23--600 cc   Elevated troponins -No chest pain, Trop elevation 49>53, pattern is flat, likely demanding ischemia from hypoxia, ACS unlikely.   COPD -Stable, continue LABA and ICS   PAF -In sinus rhythm, continue Eliquis   DM--2, w/o complication -SSI for now   CKD stage IIIa --creat back to baseline with crcl of 58   Hypothyroidism -Continue Synthroid   Deconditioning -PT evaluation--to [facility]. Pt is long term resident at [facility]   DVT prophylaxis: Eliquis Code Status: DNR Family Communication: Daughter at bedside Disposition Plan: [facility] SNF Level of care: Progressive Status is: Inpatient Remains inpatient appropriate because: Patient is sick with postviral URI, CHF    TOTAL TIME TAKING CARE OF THIS PATIENT: 45 minutes.  >50% time spent on counselling and coordination of care  Note: This dictation was prepared with Dragon dictation along with smaller phrase technology. Any transcriptional errors that result from this process are unintentional.  Jacqueline [name] M.D    [facility]   CC: Primary care physician; Earnestine Mealing, MD

## 2023-02-23 NOTE — Evaluation (Signed)
Occupational Therapy Evaluation Patient Details Name: Jacqueline [name] MRN: [id number] DOB: December 20, 1932 Today's Date: 02/23/2023   History of Present Illness Pt is an 89 year old female admitted with PNA, acute on Acute on chronic systolic CHF    PMH chronic HFrEF with LVEF 40-45%, PAF on Eliquis, COPD, chronic hypoxic respiratory failure on 4 L continuously, CKD stage II, IIDM, hypothyroidism   Clinical Impression   Chart reviewed, pt greeted in bed with daughter present. Pt is oriented [name] self, increased time for processing. PTA pt lives in SNF, performs feeding/grooming with supervision, assist for dressing, bathing, toileting however pt does participate. Pt has assist for all IADLs and amb approx 10-14ft with RW per daughter report. Pt presents with deficits in strength, endurance, activity tolerance, balance, pulmonary status affecting safe and optimal ADL completion. MAX A +1-2 required for bed mobility, MOD A +2 for STS however pt requires MAX A [name] maintain. Poor static sitting balance noted with MAX A for UB/LB dressing anticipated on this date. Pt will benefit from acute OT [name] address deficits and [name] facilitate return [name] PLOF.       If plan is discharge home, recommend the following: A lot of help with walking and/or transfers;A lot of help with bathing/dressing/bathroom;Assistance with feeding;Assistance with cooking/housework;Direct supervision/assist for financial management;Assist for transportation;Help with stairs or ramp for entrance;Direct supervision/assist for medications management    Functional Status Assessment  Patient has had a recent decline in their functional status and demonstrates the ability [name] make significant improvements in function in a reasonable and predictable amount of time.  Equipment Recommendations  Other (comment) (per next venue of care)    Recommendations for Other Services       Precautions / Restrictions Precautions Precautions:  Fall Restrictions Weight Bearing Restrictions: No      Mobility Bed Mobility Overal bed mobility: Needs Assistance Bed Mobility: Supine [name] Sit, Sit [name] Supine     Supine [name] sit: Max assist Sit [name] supine: Max assist, +2 for physical assistance   General bed mobility comments: step by step multi modal cues    Transfers Overall transfer level: Needs assistance Equipment used: Rolling walker (2 wheels) Transfers: Sit [name]/from Stand Sit [name] Stand: Mod assist, +2 physical assistance           General transfer comment: step by step multi modal cues for technique      Balance Overall balance assessment: Needs assistance Sitting-balance support: Feet supported Sitting balance-Leahy Scale: Poor Sitting balance - Comments: at least CGA   Standing balance support: Bilateral upper extremity supported, During functional activity, Reliant on assistive device for balance Standing balance-Leahy Scale: Poor Standing balance comment: posterior lean                           ADL either performed or assessed with clinical judgement   ADL Overall ADL's : Needs assistance/impaired     Grooming: Minimal assistance;Sitting           Upper Body Dressing : Maximal assistance   Lower Body Dressing: Maximal assistance   Toilet Transfer: Maximal assistance;+2 for physical assistance;+2 for safety/equipment Toilet Transfer Details (indicate cue type and reason): simulated Toileting- Clothing Manipulation and Hygiene: Maximal assistance Toileting - Clothing Manipulation Details (indicate cue type and reason): foley       General ADL Comments: participation limited by current level of cognition     Vision Patient Visual Report: No change from baseline Additional Comments: will  continue [name] assess     Perception         Praxis         Pertinent Vitals/Pain Pain Assessment Pain Assessment: Faces Faces Pain Scale: Hurts a little bit Pain Location: back Pain  Descriptors / Indicators: Discomfort Pain Intervention(s): Limited activity within patient's tolerance, Repositioned     Extremity/Trunk Assessment Upper Extremity Assessment Upper Extremity Assessment: Generalized weakness;Right hand dominant (LUE impairments from previous strokes per pt report; spasticity noted throughout L hand)   Lower Extremity Assessment Lower Extremity Assessment: Generalized weakness   Cervical / Trunk Assessment Cervical / Trunk Assessment: Normal   Communication Communication Communication: Hearing impairment Cueing Techniques: Verbal cues;Tactile cues;Visual cues   Cognition Arousal: Alert Behavior During Therapy: Flat affect Overall Cognitive Status: Impaired/Different from baseline Area of Impairment: Orientation, Attention, Memory, Following commands, Safety/judgement, Awareness, Problem solving                 Orientation Level: Disoriented [name], Time, Situation, Place Current Attention Level: Focused Memory: Decreased short-term memory Following Commands: Follows one step commands inconsistently, Follows one step commands with increased time Safety/Judgement: Decreased awareness of deficits, Decreased awareness of safety Awareness: Emergent Problem Solving: Slow processing, Decreased initiation, Requires verbal cues, Requires tactile cues       General Comments  spo2 >90% on 5L via HFNC throughout    Exercises     Shoulder Instructions      Home Living Family/patient expects [name] be discharged [name]:: Skilled nursing facility                                 Additional Comments: pt lives at [facility] per daugther report      Prior Functioning/Environment Prior Level of Function : Needs assist             Mobility Comments: amb 10-15 ft with RW [name] bathroom per daugther report ADLs Comments: daughter reports pt feeds herself, performs grooming; assist with dressing, bathing, toileting, IADLs        OT  Problem List: Decreased strength;Decreased activity tolerance;Impaired balance (sitting and/or standing);Decreased safety awareness;Decreased cognition;Decreased knowledge of use of DME or AE;Decreased knowledge of precautions;Cardiopulmonary status limiting activity      OT Treatment/Interventions: Self-care/ADL training;Balance training;Therapeutic exercise;Modalities;Therapeutic activities;Energy conservation;Patient/family education;DME and/or AE instruction    OT Goals(Current goals can be found in the care plan section) Acute Rehab OT Goals Patient Stated Goal: rehab in hospital OT Goal Formulation: With patient Time For Goal Achievement: 03/09/23 Potential [name] Achieve Goals: Good ADL Goals Pt Will Perform Grooming: with supervision;sitting Pt Will Perform Lower Body Dressing: with mod assist;sitting/lateral leans Pt Will Transfer [name] Toilet: with min assist;stand pivot transfer Pt Will Perform Toileting - Clothing Manipulation and hygiene: with mod assist;sitting/lateral leans  OT Frequency: Min 1X/week    Co-evaluation PT/OT/SLP Co-Evaluation/Treatment: Yes Reason for Co-Treatment: Complexity of the patient's impairments (multi-system involvement);For patient/therapist safety;[name] address functional/ADL transfers   OT goals addressed during session: ADL's and self-care      AM-PAC OT "6 Clicks" Daily Activity     Outcome Measure Help from another person eating meals?: A Little Help from another person taking care of personal grooming?: A Lot Help from another person toileting, which includes using toliet, bedpan, or urinal?: A Lot Help from another person bathing (including washing, rinsing, drying)?: A Lot Help from another person [name] put on and taking off regular upper body clothing?: A Lot Help  from another person [name] put on and taking off regular lower body clothing?: A Lot 6 Click Score: 13   End of Session Equipment Utilized During Treatment: Rolling walker (2  wheels);Oxygen Nurse Communication: Mobility status (IV)  Activity Tolerance: Patient limited by fatigue Patient left: in bed;with call bell/phone within reach;with bed alarm set;with family/visitor present  OT Visit Diagnosis: Other abnormalities of gait and mobility (R26.89);Muscle weakness (generalized) (M62.81)                Time: 4098-1191 OT Time Calculation (min): 18 min Charges:  OT General Charges $OT Visit: 1 Visit OT Evaluation $OT Eval Moderate Complexity: 1 Mod Oleta Mouse, OTD OTR/L  02/23/23, 12:45 PM

## 2023-02-23 NOTE — [place] FL2 (Signed)
[place] MEDICAID FL2 LEVEL OF CARE FORM     IDENTIFICATION  Patient Name: Jacqueline [name] Birthdate: 01-30-1933 Sex: female Admission Date (Current Location): 02/22/2023  [facility] and [place] Number:  Chiropodist and Address:  [facility], [address], [place], [place] [address]      Provider Number: [id number]  Attending Physician Name and Address:  Enedina Finner, MD  Relative Name and Phone Number:       Current Level of Care: Hospital Recommended Level of Care: Skilled Nursing Facility Prior Approval Number:    Date Approved/Denied:   PASRR Number: [id number]  Discharge Plan: SNF    Current Diagnoses: Patient Active Problem List   Diagnosis Date Noted   CAP (community acquired pneumonia) 02/22/2023   PNA (pneumonia) 02/22/2023   Hx of myocardial infarction 02/13/2023   Hepatic cirrhosis, unspecified hepatic cirrhosis type, unspecified whether ascites present (HCC) 07/13/2022   Recurrent major depressive disorder, in partial remission (HCC) 07/13/2022   Atherosclerosis of aorta (HCC) 07/13/2022   Hyperlipidemia 04/28/2022   Dilated idiopathic cardiomyopathy (HCC) 02/26/2022   Dependence on supplemental oxygen 02/26/2022   Chronic atrial fibrillation (HCC) 11/27/2021   Non-ST elevation myocardial infarction (NSTEMI), subendocardial infarction, subsequent episode of care (HCC) 11/26/2021   Depression 11/26/2021   Slurred speech 11/26/2021   Weakness 11/22/2021   CKD stage 3 due to type 2 diabetes mellitus (HCC) 11/21/2021   Thrombocytopenia (HCC) 07/01/2018   Chronic respiratory failure with hypoxia (HCC) 07/01/2018   Proteinuria 07/01/2018   Age-related osteoporosis without current pathological fracture 08/12/2017   S/P kyphoplasty 07/16/2017   MI, acute, non ST segment elevation (HCC) 06/01/2017   Acute upper back pain 03/05/2017   Carcinoma of unknown primary (HCC) 03/05/2017   Liver lesion 02/27/2017   Hypertension  02/24/2017   GERD (gastroesophageal reflux disease) 02/24/2017   Acquired hypothyroidism 02/24/2017   Hypoglycemia 02/24/2017   Acute on chronic systolic CHF (congestive heart failure) (HCC) 02/05/2017   Bilateral leg edema 01/12/2017   AMD (age related macular degeneration) 08/20/2015   Type 2 diabetes mellitus without complications (HCC) 08/18/2014    Orientation RESPIRATION BLADDER Height & Weight     Self, Place  O2 (Nasal Cannula 5 L. Bipap order discontinued 9/8.) Indwelling catheter Weight:   Height:     BEHAVIORAL SYMPTOMS/MOOD NEUROLOGICAL BOWEL NUTRITION STATUS     (None) Continent Diet (Soft diet)  AMBULATORY STATUS COMMUNICATION OF NEEDS Skin   Extensive Assist Verbally PU Stage and Appropriate Care PU Stage 1 Dressing:  (Coccyx: Foam.)                     Personal Care Assistance Level of Assistance  Bathing, Feeding, Dressing Bathing Assistance: Maximum assistance Feeding assistance: Limited assistance Dressing Assistance: Maximum assistance     Functional Limitations Info  Sight, Hearing, Speech Sight Info: Adequate Hearing Info: Adequate Speech Info: Adequate    SPECIAL CARE FACTORS FREQUENCY  PT (By licensed PT), OT (By licensed OT)     PT Frequency: 5 x week OT Frequency: 5 x week            Contractures Contractures Info: Not present    Additional Factors Info  Code Status, Allergies Code Status Info: DNR Allergies Info: Ciprofloxacin, Digoxin And Related, Entresto (Sacubitril-valsartan), Erythromycin, Lopid (Gemfibrozil), Nitrofurantoin, Spironolactone, Cefuroxime, Penicillins           Current Medications (02/23/2023):  This is the current hospital active medication list Current Facility-Administered Medications  Medication Dose  Route Frequency Provider Last Rate Last Admin   acetaminophen (TYLENOL) tablet 650 mg  650 mg Oral Q4H PRN Mikey College T, MD       apixaban Everlene Balls) tablet 2.5 mg  2.5 mg Oral BID Mikey College T, MD   2.5 mg  at 02/23/23 0902   atorvastatin (LIPITOR) tablet 80 mg  80 mg Oral Daily Mikey College T, MD       cefTRIAXone (ROCEPHIN) 2 g in sodium chloride 0.9 % 100 mL IVPB  2 g Intravenous Q24H Mikey College T, MD   Stopped at 02/23/23 1006   diclofenac Sodium (VOLTAREN) 1 % topical gel 2 g  2 g Topical Q6H PRN Mikey College T, MD       doxycycline (VIBRAMYCIN) 100 mg in sodium chloride 0.9 % 250 mL IVPB  100 mg Intravenous BID Mikey College T, MD 125 mL/hr at 02/23/23 1009 100 mg at 02/23/23 1009   empagliflozin (JARDIANCE) tablet 10 mg  10 mg Oral Daily Mikey College T, MD   10 mg at 02/23/23 0903   guaiFENesin (MUCINEX) 12 hr tablet 600 mg  600 mg Oral BID Mikey College T, MD   600 mg at 02/23/23 0901   insulin aspart (novoLOG) injection 0-9 Units  0-9 Units Subcutaneous TID WC Emeline General, MD   1 Units at 02/23/23 1215   ipratropium (ATROVENT HFA) inhaler 2 puff  2 puff Inhalation Q6H PRN Emeline General, MD       Ipratropium-Albuterol (COMBIVENT) respimat 1 puff  1 puff Inhalation Q6H Mikey College T, MD   1 puff at 02/23/23 0908   irbesartan (AVAPRO) tablet 150 mg  150 mg Oral Daily Mikey College T, MD   150 mg at 02/23/23 9629   levothyroxine (SYNTHROID) tablet 25 mcg  25 mcg Oral QAC breakfast Mikey College T, MD   25 mcg at 02/23/23 0633   ondansetron (ZOFRAN) tablet 4 mg  4 mg Oral Q6H PRN Emeline General, MD       Or   ondansetron [facility]) injection 4 mg  4 mg Intravenous Q6H PRN Mikey College T, MD       pantoprazole (PROTONIX) EC tablet 40 mg  40 mg Oral BID Mikey College T, MD   40 mg at 02/23/23 0902   polyethylene glycol (MIRALAX / GLYCOLAX) packet 17 g  17 g Oral Venda Rodes, MD       senna-docusate (Senokot-S) tablet 2 tablet  2 tablet Oral Daily Emeline General, MD   2 tablet at 02/23/23 0916   sertraline (ZOLOFT) tablet 50 mg  50 mg Oral Daily Mikey College T, MD   50 mg at 02/23/23 0902   torsemide (DEMADEX) tablet 20 mg  20 mg Oral Daily Mikey College T, MD   20 mg at 02/23/23 5284   Current Outpatient  Medications  Medication Sig Dispense Refill   acetaminophen (TYLENOL) 325 MG tablet Take 2 tablets (650 mg total) by mouth every 4 (four) hours as needed for headache or mild pain.     alendronate (FOSAMAX) 70 MG tablet Take 70 mg by mouth once a week. Take with a full glass of water on an empty stomach.     atorvastatin (LIPITOR) 80 MG tablet Take 1 tablet (80 mg total) by mouth daily.     calcium elemental as carbonate (TUMS ULTRA 1000) 400 MG chewable tablet Chew 1,000 mg by mouth 3 (three) times daily as needed for heartburn.  Dextromethorphan-guaiFENesin 20-400 MG TABS Take 1 tablet by mouth 2 (two) times daily.     ipratropium-albuterol (DUONEB) 0.5-2.5 (3) MG/3ML SOLN Take 3 mLs by nebulization 3 (three) times daily as needed. (Patient taking differently: Take 3 mLs by nebulization 4 (four) times daily as needed (congestion/SOB).) 360 mL 0   levothyroxine (SYNTHROID, LEVOTHROID) 25 MCG tablet Take 25 mcg by mouth daily before breakfast.     magnesium hydroxide (MILK OF MAGNESIA) 400 MG/5ML suspension Take 30 mLs by mouth daily as needed for mild constipation.     apixaban (ELIQUIS) 2.5 MG TABS tablet Take 1 tablet (2.5 mg total) by mouth 2 (two) times daily. Start on 11/30/2021 60 tablet    ARTIFICIAL TEAR SOLUTION OP Apply 1 drop to eye 2 (two) times daily. Both Eyes.     diclofenac Sodium (VOLTAREN) 1 % GEL Apply 2 g topically daily as needed (pain). Apply to back topically every day shift for pain.     ergocalciferol (VITAMIN D2) 50000 units capsule Take 50,000 Units by mouth once a week. Monday     JARDIANCE 10 MG TABS tablet Take 10 mg by mouth daily.     OXYGEN 4lpm via [place] for SOB continuous.     pantoprazole (PROTONIX) 40 MG tablet Take 40 mg by mouth in the morning and at bedtime.     polyethylene glycol (MIRALAX / GLYCOLAX) 17 g packet Take 17 g by mouth every other day.     senna-docusate (SENOKOT-S) 8.6-50 MG tablet Take 2 tablets by mouth daily.     sertraline (ZOLOFT) 50 MG  tablet Take 50 mg by mouth daily.     torsemide (DEMADEX) 10 MG tablet Take 20 mg by mouth daily.     valsartan (DIOVAN) 80 MG tablet Take 160 mg by mouth daily.       Discharge Medications: Please see discharge summary for a list of discharge medications.  Relevant Imaging Results:  Relevant Lab Results:   Additional Information SS#: [id number]  Margarito Liner, LCSW

## 2023-02-23 NOTE — Evaluation (Signed)
Physical Therapy Evaluation Patient Details Name: Jacqueline [name] MRN: [id number] DOB: [birth year] Today's Date: 02/23/2023  History of Present Illness  Jacqueline [name] is a 89 y.o. female with medical history significant of chronic HFrEF with LVEF 40-45%, PAF on Eliquis, COPD, chronic hypoxic respiratory failure on 4 L continuously, CKD stage II, IIDM, hypothyroidism, sent from nursing home for evaluation of worsening of cough and shortness of breath. Patient is Covid +   Clinical Impression  Patient received sitting up on side of bed with OT and daughter present in room. Patient presents with flat affect. Decreased initiation for mobility, requires verbal at tactile cues. She is able to stand with mod/max +2 assist with RW. Poor standing balance with posterior leaning. She is unable to attempt taking steps today.  She required +2 total assist to return to supine and for positioning in bed. Patient will continue to benefit from skilled PT to improve functional mobility and strength.            If plan is discharge home, recommend the following: A lot of help with walking and/or transfers;A lot of help with bathing/dressing/bathroom   Can travel by private vehicle   No    Equipment Recommendations None recommended by PT  Recommendations for Other Services       Functional Status Assessment Patient has had a recent decline in their functional status and demonstrates the ability to make significant improvements in function in a reasonable and predictable amount of time.     Precautions / Restrictions Precautions Precautions: Fall Restrictions Weight Bearing Restrictions: No      Mobility  Bed Mobility Overal bed mobility: Needs Assistance Bed Mobility: Supine to Sit, Sit to Supine     Supine to sit: Max assist Sit to supine: Max assist, +2 for physical assistance        Transfers Overall transfer level: Needs assistance Equipment used: Rolling walker (2 wheels) Transfers: Sit  to/from Stand Sit to Stand: Mod assist, +2 physical assistance                Ambulation/Gait               General Gait Details: unable at this time  Stairs            Wheelchair Mobility     Tilt Bed    Modified Rankin (Stroke Patients Only)       Balance Overall balance assessment: Needs assistance Sitting-balance support: Feet supported Sitting balance-Leahy Scale: Fair     Standing balance support: Bilateral upper extremity supported, During functional activity, Reliant on assistive device for balance Standing balance-Leahy Scale: Poor Standing balance comment: poor standing balance, she has posterior leaning in standing                             Pertinent Vitals/Pain Pain Assessment Pain Assessment: Faces Faces Pain Scale: Hurts a little bit Pain Location: back Pain Descriptors / Indicators: Discomfort Pain Intervention(s): Monitored during session, Repositioned    Home Living Family/patient expects to be discharged to:: Skilled nursing facility                        Prior Function Prior Level of Function : Needs assist             Mobility Comments: ambulates ab out 10 feet at baseline with walker and assistance ADLs Comments: Daughter provides ADLs for patient  Extremity/Trunk Assessment   Upper Extremity Assessment Upper Extremity Assessment: Defer to OT evaluation    Lower Extremity Assessment Lower Extremity Assessment: Generalized weakness (daughter reports L side is weaker from prior CVA)    Cervical / Trunk Assessment Cervical / Trunk Assessment: Normal  Communication   Communication Communication: Hearing impairment Cueing Techniques: Verbal cues;Gestural cues;Tactile cues;Visual cues  Cognition Arousal: Alert Behavior During Therapy: Flat affect Overall Cognitive Status: Impaired/Different from baseline Area of Impairment: Problem solving, Following commands, Safety/judgement,  Awareness                       Following Commands: Follows one step commands inconsistently, Follows one step commands with increased time   Awareness: Emergent Problem Solving: Slow processing, Decreased initiation, Requires verbal cues, Requires tactile cues          General Comments      Exercises     Assessment/Plan    PT Assessment Patient needs continued PT services  PT Problem List Decreased strength;Decreased balance;Decreased activity tolerance;Decreased mobility       PT Treatment Interventions DME instruction;Gait training;Stair training;Functional mobility training;Therapeutic activities;Therapeutic exercise;Patient/family education;Balance training    PT Goals (Current goals can be found in the Care Plan section)  Acute Rehab PT Goals Patient Stated Goal: to return to baseline PT Goal Formulation: With family Time For Goal Achievement: 03/09/23 Potential to Achieve Goals: Fair    Frequency Min 1X/week     Co-evaluation               AM-PAC PT "6 Clicks" Mobility  Outcome Measure Help needed turning from your back to your side while in a flat bed without using bedrails?: A Lot Help needed moving from lying on your back to sitting on the side of a flat bed without using bedrails?: A Lot Help needed moving to and from a bed to a chair (including a wheelchair)?: A Lot Help needed standing up from a chair using your arms (e.g., wheelchair or bedside chair)?: A Lot Help needed to walk in hospital room?: Total Help needed climbing 3-5 steps with a railing? : Total 6 Click Score: 10    End of Session Equipment Utilized During Treatment: Oxygen Activity Tolerance: Patient limited by fatigue Patient left: in bed;with call bell/phone within reach;with family/visitor present Nurse Communication: Mobility status PT Visit Diagnosis: Other abnormalities of gait and mobility (R26.89);Muscle weakness (generalized) (M62.81);Difficulty in walking, not  elsewhere classified (R26.2);Unsteadiness on feet (R26.81)    Time: 1100-1115 PT Time Calculation (min) (ACUTE ONLY): 15 min   Charges:   PT Evaluation $PT Eval Moderate Complexity: 1 Mod   PT General Charges $$ ACUTE PT VISIT: 1 Visit         Price Lachapelle, PT, GCS 02/23/23,11:55 AM

## 2023-02-24 ENCOUNTER — Other Ambulatory Visit (HOSPITAL_COMMUNITY): Payer: Self-pay

## 2023-02-24 DIAGNOSIS — A419 Sepsis, unspecified organism: Secondary | ICD-10-CM | POA: Diagnosis not present

## 2023-02-24 DIAGNOSIS — I5023 Acute on chronic systolic (congestive) heart failure: Secondary | ICD-10-CM | POA: Diagnosis not present

## 2023-02-24 DIAGNOSIS — U071 COVID-19: Secondary | ICD-10-CM | POA: Diagnosis not present

## 2023-02-24 LAB — LEGIONELLA PNEUMOPHILA SEROGP 1 UR AG: L. pneumophila Serogp 1 Ur Ag: NEGATIVE

## 2023-02-24 LAB — GLUCOSE, CAPILLARY
Glucose-Capillary: 121 mg/dL — ABNORMAL HIGH (ref 70–99)
Glucose-Capillary: 139 mg/dL — ABNORMAL HIGH (ref 70–99)
Glucose-Capillary: 100 mg/dL — ABNORMAL HIGH (ref 70–99)

## 2023-02-24 LAB — MYCOPLASMA PNEUMONIAE ANTIBODY, IGM: Mycoplasma pneumo IgM: <770 U/mL (ref 0–769)

## 2023-02-24 MED ORDER — CEFPODOXIME PROXETIL 200 MG PO TABS
200.0000 mg | ORAL_TABLET | Freq: Two times a day (BID) | ORAL | Status: DC
  Administered 2023-02-24 – 2023-02-25 (×2): 200 mg via ORAL
  Filled 2023-02-24 (×4): qty 1

## 2023-02-24 NOTE — Discharge Instructions (Signed)

## 2023-02-24 NOTE — TOC Progression Note (Signed)
Transition of Care [facility]) - Progression Note    Patient Details  Name: Jacqueline [name] MRN: [id number] Date of Birth: 1932-07-07  Transition [facility]) CM/SW Contact  Darolyn Rua, [place] Phone Number: 02/24/2023, 3:25 PM  Clinical Narrative:     CSW spoke with [name] with [facility] she confirms patient can return to [facility] tomorrow, MD updated.   Expected Discharge Plan: Skilled Nursing Facility Barriers to Discharge: Continued Medical Work up  Expected Discharge Plan and Services     Post Acute Care Choice: Resumption of Svcs/PTA Provider Living arrangements for the past 2 months: Skilled Nursing Facility                                       Social Determinants of Health (SDOH) Interventions SDOH Screenings   Food Insecurity: No Food Insecurity (02/23/2023)  Housing: Low Risk  (02/23/2023)  Transportation Needs: No Transportation Needs (02/23/2023)  Utilities: Not At Risk (02/23/2023)  Depression (PHQ2-9): Low Risk  (12/18/2021)  Tobacco Use: Medium Risk (02/23/2023)    Readmission Risk Interventions     No data to display

## 2023-02-24 NOTE — Plan of Care (Signed)
  Problem: Education: Goal: Ability to describe self-care measures that may prevent or decrease complications (Diabetes Survival Skills Education) will improve Outcome: Progressing Goal: Individualized Educational Video(s) Outcome: Progressing   Problem: Coping: Goal: Ability to adjust to condition or change in health will improve Outcome: Progressing   Problem: Fluid Volume: Goal: Ability to maintain a balanced intake and output will improve Outcome: Progressing   Problem: Health Behavior/Discharge Planning: Goal: Ability to identify and utilize available resources and services will improve Outcome: Progressing Goal: Ability to manage health-related needs will improve Outcome: Progressing   Problem: Metabolic: Goal: Ability to maintain appropriate glucose levels will improve Outcome: Progressing   Problem: Nutritional: Goal: Maintenance of adequate nutrition will improve Outcome: Progressing Goal: Progress toward achieving an optimal weight will improve Outcome: Progressing   Problem: Skin Integrity: Goal: Risk for impaired skin integrity will decrease Outcome: Progressing   Problem: Education: Goal: Knowledge of General Education information will improve Description: Including pain rating scale, medication(s)/side effects and non-pharmacologic comfort measures Outcome: Progressing

## 2023-02-24 NOTE — Progress Notes (Addendum)
[facility]  - [facility] at [facility]   PATIENT NAME: Jacqueline [name]    MR#:  [id number]  DATE OF BIRTH:  [birth year]  SUBJECTIVE:  no family at bedside during my evaluation. Spoke with patient's daughter Ms. Jacqueline [name] on the phone and updated. Patient ate good breakfast tolerating PO liquids. No fever. Answered some of the questions appropriately. Has some pleasant confusion at baseline. No other issues per RN. Worked with physical therapy occupational therapy yesterday.  VITALS:  Blood pressure 135/67, pulse 87, temperature 97.6 F (36.4 C), temperature source Oral, resp. rate 16, SpO2 96%.  PHYSICAL EXAMINATION:   GENERAL:  89 y.o.-year-old patient with no acute distress.  LUNGS: decreased breath sounds bilaterally, no wheezing CARDIOVASCULAR: S1, S2 normal. No murmur   ABDOMEN: Soft, nontender, nondistended. Marland Kitchen  EXTREMITIES: No  edema b/l.    NEUROLOGIC: nonfocal  patient is alert and awake, weak Pressure Injury 02/22/23 Coccyx Stage 1 -  Intact skin with non-blanchable redness of a localized area usually over a bony prominence. (Active)  02/22/23 2000  Location: Coccyx  Location Orientation:   Staging: Stage 1 -  Intact skin with non-blanchable redness of a localized area usually over a bony prominence.  Wound Description (Comments):   Present on Admission: Yes     LABORATORY PANEL:  CBC Recent Labs  Lab 02/23/23 0213  WBC 10.9*  HGB 13.1  HCT 41.6  PLT 210    Chemistries  Recent Labs  Lab 02/22/23 0745 02/23/23 0213  NA 135 138  K 3.7 3.5  CL 100 102  CO2 25 26  GLUCOSE 145* 111*  BUN 26* 24*  CREATININE 1.04* 0.94  CALCIUM 8.7* 8.2*  AST 20  --   ALT 15  --   ALKPHOS 91  --   BILITOT 1.1  --     Assessment and Plan  Jacqueline [name] is a 89 y.o. female with medical history significant of chronic HFrEF with LVEF 40-45%, PAF on Eliquis, COPD, chronic hypoxic respiratory failure on 4 L continuously, CKD stage II, IIDM, hypothyroidism, sent from  nursing home for evaluation of worsening of cough and shortness of breath.   Patient was tested positive for COVID on August 22 on the routine COVID screening, for which she received Paxlovid x 5 days.  Last Saturday-Sunday, patient developed a productive cough with whitish phlegm however patient was weak and unable to cough up much of the plan.  Along with worsening of hypoxia required 5 L instead of 4 L as her baseline.    Acute on chronic hypoxic respiratory failure -Clinically suspect postviral upper respiratory tract infection, bacterial -- continue antibiotics coverage ceftriaxone and azithromycin -- chest x-ray negative for pneumonia -- Pro calcitonin less than 0.01 ---now on po torsemide. Creat stable --Incentive spirometry and flutter valve  Acute on chronic HFrEF decompensation -Patient was tachycardia and hypertensive on arrival however clinically appears to be mild volume overloaded-euvolemic and received 1 dose of IV Lasix in the ED - resume p.o. torsemide --good uop   Abnormal Urinalysis --UC not sent from ER -Cover with ceftriaxone   Acute urinary retention -Foley was placed in, expect void trial and discontinue Foley in 1 to 2 days. --bladder scan 02/22/23--600 cc --d/c foley today   Elevated troponins -No chest pain, Trop elevation 49>53, pattern is flat, likely demanding ischemia from hypoxia, ACS unlikely.   COPD -Stable, continue LABA and ICS   PAF -In sinus rhythm, continue Eliquis   DM--2, w/o complication -SSI for  now --Jardiance   CKD stage IIIa --creat back to baseline with crcl of 58   Hypothyroidism -Continue Synthroid   Deconditioning -PT evaluation--to [facility]. Pt is long term resident at [facility]   DVT prophylaxis: Eliquis Code Status: DNR Family Communication: Daughter Jacqueline [name] on the phone Disposition Plan: [facility] SNF Level of care: Progressive Status is: Inpatient Remains inpatient appropriate because: monitor one more day     TOTAL TIME TAKING CARE OF THIS PATIENT: 35 minutes.  >50% time spent on counselling and coordination of care  Note: This dictation was prepared with Dragon dictation along with smaller phrase technology. Any transcriptional errors that result from this process are unintentional.  Enedina Finner M.D    [facility]   CC: Primary care physician; Earnestine Mealing, MD

## 2023-02-25 DIAGNOSIS — U071 COVID-19: Secondary | ICD-10-CM | POA: Diagnosis not present

## 2023-02-25 DIAGNOSIS — J1282 Pneumonia due to coronavirus disease 2019: Secondary | ICD-10-CM

## 2023-02-25 LAB — CBC WITH DIFFERENTIAL/PLATELET
Abs Immature Granulocytes: 0.05 10*3/uL (ref 0.00–0.07)
Basophils Absolute: 0 10*3/uL (ref 0.0–0.1)
Basophils Relative: 0 %
Eosinophils Absolute: 0 10*3/uL (ref 0.0–0.5)
Eosinophils Relative: 0 %
HCT: 45 % (ref 36.0–46.0)
Hemoglobin: 13.9 g/dL (ref 12.0–15.0)
Immature Granulocytes: 1 %
Lymphocytes Relative: 20 %
Lymphs Abs: 1.7 10*3/uL (ref 0.7–4.0)
MCH: 25.9 pg — ABNORMAL LOW (ref 26.0–34.0)
MCHC: 30.9 g/dL (ref 30.0–36.0)
MCV: 84 fL (ref 80.0–100.0)
Monocytes Absolute: 0.5 10*3/uL (ref 0.1–1.0)
Monocytes Relative: 6 %
Neutro Abs: 6 10*3/uL (ref 1.7–7.7)
Neutrophils Relative %: 73 %
Platelets: 237 10*3/uL (ref 150–400)
RBC: 5.36 MIL/uL — ABNORMAL HIGH (ref 3.87–5.11)
RDW: 16.2 % — ABNORMAL HIGH (ref 11.5–15.5)
WBC: 8.2 10*3/uL (ref 4.0–10.5)
nRBC: 0 % (ref 0.0–0.2)

## 2023-02-25 LAB — BASIC METABOLIC PANEL
Anion gap: 8 (ref 5–15)
BUN: 23 mg/dL (ref 8–23)
CO2: 26 mmol/L (ref 22–32)
Calcium: 8.2 mg/dL — ABNORMAL LOW (ref 8.9–10.3)
Chloride: 104 mmol/L (ref 98–111)
Creatinine, Ser: 0.79 mg/dL (ref 0.44–1.00)
GFR, Estimated: >60 mL/min (ref >60)
Glucose, Bld: 157 mg/dL — ABNORMAL HIGH (ref 70–99)
Potassium: 3.5 mmol/L (ref 3.5–5.1)
Sodium: 138 mmol/L (ref 135–145)

## 2023-02-25 LAB — GLUCOSE, CAPILLARY
Glucose-Capillary: 110 mg/dL — ABNORMAL HIGH (ref 70–99)
Glucose-Capillary: 116 mg/dL — ABNORMAL HIGH (ref 70–99)
Glucose-Capillary: 123 mg/dL — ABNORMAL HIGH (ref 70–99)

## 2023-02-25 MED ORDER — CEFPODOXIME PROXETIL 200 MG PO TABS: 200.0000 mg | ORAL_TABLET | Freq: Two times a day (BID) | ORAL | Status: AC

## 2023-02-25 NOTE — TOC Transition Note (Signed)
Transition of Care [facility]) - Progression Note    Patient Details  Name: YASMINA GEVORKYAN MRN: [id number] Date of Birth: 07/04/1932  [facility]) CM/SW Contact  Truddie Hidden, RN Phone Number: 02/25/2023, 2:22 PM  Clinical Narrative:    Sherron Monday with [name]  in admissions at [facility]  Per facility patient admission confirmed for today. Patient assigned room # 416 Nurse will call report to [phone] Nurse, and family notified spoke with  Face sheet and medical necessity forms printed to the floor to be added to the EMS pack EMS arranged  Discharge summary and SNF transfer report sent in HUB.  TOC signing off.    Expected Discharge Plan: Skilled Nursing Facility Barriers to Discharge: Continued Medical Work up  Expected Discharge Plan and Services     Post Acute Care Choice: Resumption of Svcs/PTA Provider Living arrangements for the past 2 months: Skilled Nursing Facility Expected Discharge Date: 02/25/23                                     Social Determinants of Health (SDOH) Interventions SDOH Screenings   Food Insecurity: No Food Insecurity (02/23/2023)  Housing: Low Risk  (02/23/2023)  Transportation Needs: No Transportation Needs (02/23/2023)  Utilities: Not At Risk (02/23/2023)  Depression (PHQ2-9): Low Risk  (12/18/2021)  Tobacco Use: Medium Risk (02/23/2023)    Readmission Risk Interventions     No data to display

## 2023-02-25 NOTE — Discharge Summary (Signed)
Physician Discharge Summary   [name] MRN: [id number] DOB: 1933/03/20  Admit date:     02/22/2023  Discharge date: 02/25/23  Discharge Physician: Jonah Blue   PCP: Earnestine Mealing, MD   Recommendations at discharge:   Complete antibiotics (Cefpodoxime x 5 days) You are being discharged to SNF rehab at [facility] Follow up with Dr. Sydnee Cabal  Discharge Diagnoses: Principal Problem:   PNA (pneumonia) Active Problems:   Chronic atrial fibrillation (HCC)   Chronic respiratory failure with hypoxia (HCC)   Hypertension   Acquired hypothyroidism   Acute on chronic systolic CHF (congestive heart failure) (HCC)   CKD stage 3 due to type 2 diabetes mellitus (HCC)   Type 2 diabetes mellitus without complications (HCC)   Hyperlipidemia   CAP (community acquired pneumonia)   COVID-19   Pressure injury of skin    Hospital Course: 89yo with h/o HFrEF, afib on Eliquis, COPD on 4L [place] O2, DM, and hypothyroidism who presented on 9/8 with cough and SOB, O2 sats in the 70s.  She was previously diagnosed with COVID on 8/22 and treated with Paxlovid for 5 days.  She was diagnosed with post-viral bacterial infection and treated with Ceftriaxone and Azithromycin.  She was also treated for acute on chronic systolic CHF.    Assessment and Plan:   Acute on chronic hypoxic respiratory failure associated with post-COVID PNA Started on CAP antibiotics on admission and continued to dc Will dc on Cefpodoxime for 5 days Chest x-ray negative for pneumonia Procalcitonin less than 0.01 Incentive spirometry and flutter valve   Chronic HFrEF Patient was tachycardia and hypertensive on arrival however clinically appears to be mild volume overloaded-euvolemic Received 1 dose of IV Lasix in the ED Resume p.o. torsemide Good uop   Abnormal Urinalysis UC not sent from ER Treated with abx as above   Acute urinary retention Foley was placed but eventually passed voiding trial and so foley  was discontinued   Elevated troponins No chest pain, Trop elevation 49>53, pattern is flat, likely demanding ischemia from hypoxia, ACS unlikely.   COPD Stable, continue LABA and ICS   PAF In sinus rhythm, continue Eliquis   DM--2, w/o complication Resume Jardiance   CKD stage IIIa Creatinine back to baseline with GFR >60   Hypothyroidism Continue Synthroid   Deconditioning PT evaluation--to [facility]. Pt is long term resident at [facility]  Pressure injury Pressure Injury 02/22/23 Coccyx Stage 1 -  Intact skin with non-blanchable redness of a localized area usually over a bony prominence. (Active)  02/22/23 2000  Location: Coccyx  Location Orientation:   Staging: Stage 1 -  Intact skin with non-blanchable redness of a localized area usually over a bony prominence.  Wound Description (Comments):   Present on Admission: Yes        Consultants: PT/OT TOC team  Procedures: None  Antibiotics: Cefepime once Vancomycin once Ceftriaxone 9/9-10 Doxycyline once Cefpodoxime 9/10-15  30 Day Unplanned Readmission Risk Score    Flowsheet Row ED to Hosp-Admission (Current) from 02/22/2023 in [facility]  30 Day Unplanned Readmission Risk Score (%) 14.78 Filed at 02/25/2023 1200       This score is the patient's risk of an unplanned readmission within 30 days of being discharged (0 -100%). The score is based on dignosis, age, lab data, medications, orders, and past utilization.   Low:  0-14.9   Medium: 15-21.9   High: 22-29.9   Extreme: 30 and above  Disposition: Skilled nursing facility Diet recommendation:  Carb modified diet DISCHARGE MEDICATION: Allergies as of 02/25/2023       Reactions   Ciprofloxacin    Digoxin And Related    Entresto [sacubitril-valsartan] Other (See Comments)   Hypotensive   Erythromycin    Lopid [gemfibrozil]    Nitrofurantoin    Spironolactone Other (See Comments)   Hospitalized for dehydration    Cefuroxime Rash   Penicillins Rash   Has patient had a PCN reaction causing immediate rash, facial/tongue/throat swelling, SOB or lightheadedness with hypotension: Unknown Has patient had a PCN reaction causing severe rash involving mucus membranes or skin necrosis: No Has patient had a PCN reaction that required hospitalization: No Has patient had a PCN reaction occurring within the last 10 years: No If all of the above answers are "NO", then may proceed with Cephalosporin use.        Medication List     TAKE these medications    acetaminophen 325 MG tablet Commonly known as: TYLENOL Take 2 tablets (650 mg total) by mouth every 4 (four) hours as needed for headache or mild pain.   alendronate 70 MG tablet Commonly known as: FOSAMAX Take 70 mg by mouth once a week. Take with a full glass of water on an empty stomach.   apixaban 2.5 MG Tabs tablet Commonly known as: ELIQUIS Take 1 tablet (2.5 mg total) by mouth 2 (two) times daily. Start on 11/30/2021   ARTIFICIAL TEAR SOLUTION OP Apply 1 drop to eye 2 (two) times daily. Both Eyes.   atorvastatin 80 MG tablet Commonly known as: LIPITOR Take 1 tablet (80 mg total) by mouth daily.   cefpodoxime 200 MG tablet Commonly known as: VANTIN Take 1 tablet (200 mg total) by mouth every 12 (twelve) hours for 4 days.   Dextromethorphan-guaiFENesin 20-400 MG Tabs Take 1 tablet by mouth 2 (two) times daily.   diclofenac Sodium 1 % Gel Commonly known as: VOLTAREN Apply 2 g topically daily as needed (pain). Apply to back topically every day shift for pain.   ergocalciferol 1.25 MG (50000 UT) capsule Commonly known as: VITAMIN D2 Take 50,000 Units by mouth once a week. Monday   ipratropium-albuterol 0.5-2.5 (3) MG/3ML Soln Commonly known as: DUONEB Take 3 mLs by nebulization 3 (three) times daily as needed. What changed:  when to take this reasons to take this   Jardiance 10 MG Tabs tablet Generic drug: empagliflozin Take 10 mg  by mouth daily.   levothyroxine 25 MCG tablet Commonly known as: SYNTHROID Take 25 mcg by mouth daily before breakfast.   magnesium hydroxide 400 MG/5ML suspension Commonly known as: MILK OF MAGNESIA Take 30 mLs by mouth daily as needed for mild constipation.   OXYGEN 4lpm via [place] for SOB continuous.   pantoprazole 40 MG tablet Commonly known as: PROTONIX Take 40 mg by mouth in the morning and at bedtime.   polyethylene glycol 17 g packet Commonly known as: MIRALAX / GLYCOLAX Take 17 g by mouth every other day.   senna-docusate 8.6-50 MG tablet Commonly known as: Senokot-S Take 2 tablets by mouth daily.   sertraline 50 MG tablet Commonly known as: ZOLOFT Take 50 mg by mouth daily.   torsemide 10 MG tablet Commonly known as: DEMADEX Take 20 mg by mouth daily.   Tums Ultra 1000 1000 MG chewable tablet Generic drug: calcium elemental as carbonate Chew 1,000 mg by mouth 3 (three) times daily as needed for heartburn.   valsartan 80 MG tablet  Commonly known as: DIOVAN Take 160 mg by mouth daily.        Contact information for after-discharge care     Destination     [facility] .   Service: Skilled Nursing Contact information: [address] [place] [address] [phone]                    Discharge Exam:    Subjective: Little to say but no specific complaints.  Denies SOB.   Objective: Vitals:   02/25/23 0824 02/25/23 1252  BP: (!) 142/78 134/71  Pulse: 85 86  Resp: 20 20  Temp: (!) 97.5 F (36.4 C) 97.7 F (36.5 C)  SpO2: 98% 97%    Intake/Output Summary (Last 24 hours) at 02/25/2023 1300 Last data filed at 02/25/2023 0431 Gross per 24 hour  Intake 382.71 ml  Output 1200 ml  Net -817.29 ml   There were no vitals filed for this visit.  Exam:  General:  Appears calm and comfortable and is in NAD, on [place] O2 Eyes:   EOMI, normal lids, iris ENT:  grossly normal hearing, lips & tongue, mmm Neck:   no LAD, masses or thyromegaly Cardiovascular:  RRR, no m/r/g. No LE edema.  Respiratory:   CTA bilaterally with diffuse coarse breath sounds.  Normal respiratory effort. Abdomen:  soft, NT, ND Skin:  no rash or induration seen on limited exam Musculoskeletal:  grossly normal tone BUE/BLE, good ROM, no bony abnormality Psychiatric:  blunted mood and affect, speech sparse Neurologic:  CN 2-12 grossly intact, moves all extremities in coordinated fashion   Data Reviewed: I have reviewed the patient's lab results since admission.  Pertinent labs for today include:  Glucose 157 WBC 8.2     Condition at discharge: improving  The results of significant diagnostics from this hospitalization (including imaging, microbiology, ancillary and laboratory) are listed below for reference.   Imaging Studies: DG Chest Portable 1 View  Result Date: 02/22/2023 CLINICAL DATA:  89 year old female with history of shortness of breath. EXAM: PORTABLE CHEST 1 VIEW COMPARISON:  Chest x-ray 11/26/2021. FINDINGS: Lung volumes are normal. No consolidative airspace disease. No pleural effusions. No pneumothorax. No pulmonary nodule or mass noted. Pulmonary vasculature and the cardiomediastinal silhouette are within normal limits. Atherosclerosis in the thoracic aorta. Sclerotic lesion in the left proximal humerus, similar to prior studies, likely an enchondroma. IMPRESSION: 1.  No radiographic evidence of acute cardiopulmonary disease. 2. Aortic atherosclerosis. Electronically Signed   By: Trudie Reed M.D.   On: 02/22/2023 08:36    Microbiology: Results for orders placed or performed during the hospital encounter of 02/22/23  Blood culture (routine x 2)     Status: None (Preliminary result)   Collection Time: 02/22/23  7:45 AM   Specimen: BLOOD  Result Value Ref Range Status   Specimen Description BLOOD RIGHT ANTECUBITAL  Final   Special Requests   Final    BOTTLES DRAWN AEROBIC AND ANAEROBIC Blood Culture  results may not be optimal due to an excessive volume of blood received in culture bottles   Culture   Final    NO GROWTH 3 DAYS Performed at [facility], [address] [place], [place] [address]    Report Status PENDING  Incomplete  Blood culture (routine x 2)     Status: None (Preliminary result)   Collection Time: 02/22/23  7:45 AM   Specimen: BLOOD  Result Value Ref Range Status   Specimen Description BLOOD  BLOOD LEFT WRIST  Final   Special Requests   Final    BOTTLES DRAWN AEROBIC AND ANAEROBIC Blood Culture results may not be optimal due to an inadequate volume of blood received in culture bottles   Culture   Final    NO GROWTH 3 DAYS Performed at [facility], [address], [place], [place] [address]    Report Status PENDING  Incomplete  SARS Coronavirus 2 by RT PCR (hospital order, performed in [facility] hospital lab) *cepheid single result test* Anterior Nasal Swab     Status: Abnormal   Collection Time: 02/22/23  7:47 AM   Specimen: Anterior Nasal Swab  Result Value Ref Range Status   SARS Coronavirus 2 by RT PCR POSITIVE (A) NEGATIVE Final    Comment: (NOTE) SARS-CoV-2 target nucleic acids are DETECTED  SARS-CoV-2 RNA is generally detectable in upper respiratory specimens  during the acute phase of infection.  Positive results are indicative  of the presence of the identified virus, but do not rule out bacterial infection or co-infection with other pathogens not detected by the test.  Clinical correlation with patient history and  other diagnostic information is necessary to determine patient infection status.  The expected result is negative.  Fact Sheet for Patients:   RoadLapTop.co.za   Fact Sheet for Healthcare Providers:   http://kim-miller.com/    This test is not yet approved or cleared by the [place] FDA and  has been authorized for detection and/or diagnosis of SARS-CoV-2 by FDA  under an Emergency Use Authorization (EUA).  This EUA will remain in effect (meaning this test can be used) for the duration of  the COVID-19 declaration under Section 564(b)(1)  of the Act, 21 U.S.C. section 360-bbb-3(b)(1), unless the authorization is terminated or revoked sooner.   Performed at [facility], [address] [place], [place] [address]     Labs: CBC: Recent Labs  Lab 02/22/23 0745 02/23/23 0213 02/25/23 0957  WBC 15.0* 10.9* 8.2  NEUTROABS  --   --  6.0  HGB 14.7 13.1 13.9  HCT 47.0* 41.6 45.0  MCV 84.7 83.2 84.0  PLT 249 210 237   Basic Metabolic Panel: Recent Labs  Lab 02/22/23 0745 02/23/23 0213 02/25/23 0957  NA 135 138 138  K 3.7 3.5 3.5  CL 100 102 104  CO2 25 26 26   GLUCOSE 145* 111* 157*  BUN 26* 24* 23  CREATININE 1.04* 0.94 0.79  CALCIUM 8.7* 8.2* 8.2*   Liver Function Tests: Recent Labs  Lab 02/22/23 0745  AST 20  ALT 15  ALKPHOS 91  BILITOT 1.1  PROT 7.9  ALBUMIN 3.8   CBG: Recent Labs  Lab 02/24/23 0756 02/24/23 1123 02/24/23 1623 02/25/23 0827 02/25/23 1256  GLUCAP 121* 139* 100* 110* 116*    Discharge time spent: greater than 30 minutes.  Signed: Jonah Blue, MD [facility] 02/25/2023

## 2023-02-25 NOTE — Hospital Course (Signed)
89yo with h/o HFrEF, afib on Eliquis, COPD on 4L [place] O2, DM, and hypothyroidism who presented on 9/8 with cough and SOB, O2 sats in the 70s.  She was previously diagnosed with COVID on 8/22 and treated with Paxlovid for 5 days.  She was diagnosed with post-viral bacterial infection and treated with Ceftriaxone and Azithromycin.  She was also treated for acute on chronic systolic CHF.

## 2023-02-27 ENCOUNTER — Non-Acute Institutional Stay (SKILLED_NURSING_FACILITY): Payer: Self-pay | Admitting: Student

## 2023-02-27 ENCOUNTER — Encounter: Payer: Self-pay | Admitting: Student

## 2023-02-27 ENCOUNTER — Telehealth: Payer: Self-pay | Admitting: Student

## 2023-02-27 DIAGNOSIS — I10 Essential (primary) hypertension: Secondary | ICD-10-CM

## 2023-02-27 DIAGNOSIS — J9611 Chronic respiratory failure with hypoxia: Secondary | ICD-10-CM | POA: Diagnosis not present

## 2023-02-27 DIAGNOSIS — I252 Old myocardial infarction: Secondary | ICD-10-CM

## 2023-02-27 DIAGNOSIS — K7460 Unspecified cirrhosis of liver: Secondary | ICD-10-CM

## 2023-02-27 DIAGNOSIS — I482 Chronic atrial fibrillation, unspecified: Secondary | ICD-10-CM | POA: Diagnosis not present

## 2023-02-27 DIAGNOSIS — R410 Disorientation, unspecified: Secondary | ICD-10-CM | POA: Diagnosis not present

## 2023-02-27 DIAGNOSIS — J189 Pneumonia, unspecified organism: Secondary | ICD-10-CM

## 2023-02-27 DIAGNOSIS — E1122 Type 2 diabetes mellitus with diabetic chronic kidney disease: Secondary | ICD-10-CM

## 2023-02-27 DIAGNOSIS — E039 Hypothyroidism, unspecified: Secondary | ICD-10-CM

## 2023-02-27 DIAGNOSIS — N183 Chronic kidney disease, stage 3 unspecified: Secondary | ICD-10-CM

## 2023-02-27 DIAGNOSIS — F3341 Major depressive disorder, recurrent, in partial remission: Secondary | ICD-10-CM

## 2023-02-27 LAB — CULTURE, BLOOD (ROUTINE X 2)
Culture: NO GROWTH
Culture: NO GROWTH

## 2023-02-27 NOTE — Telephone Encounter (Signed)
Received call from nursing that patient had low temperature 94.1. Ordered rectal termperature.

## 2023-02-27 NOTE — Progress Notes (Unsigned)
Provider:  Dr. Earnestine Mealing Location:  Other [place].  Nursing Home Room Number: [facility] 416A Place of Service:  SNF ([id number])  PCP: Earnestine Mealing, MD Patient Care Team: Earnestine Mealing, MD as PCP - General (Family Medicine) Benita Gutter, RN as Registered Nurse  Extended Emergency Contact Information Primary Emergency Contact: [facility] Phone: [phone] Relation: Son Secondary Emergency Contact: [name] Address: [address]          [place], [place] [address] [name] of [place] Home Phone: [phone] Mobile Phone: [phone] Relation: Daughter  Code Status: DNR Goals of Care: Advanced Directive information    02/27/2023   10:01 AM  Advanced Directives  Does Patient Have a Medical Advance Directive? Yes  Type of Advance Directive Out of facility DNR (pink MOST or yellow form)  Does patient want to make changes to medical advance directive? No - Patient declined      Chief Complaint  Patient presents with   New Admit To SNF    Admission.     HPI: Patient is a 89 y.o. female seen today for admission to [facility]   Patient is sleeping and difficult to arouse.  Nursing with concern she has been week and less interactive.   Spoke with daughter [name] [facility]) given concern patient has been less interactive, eating less, and has a poor prognosis at baseline given atrial fibrillation, hx of strokes, and chronic use of oxygen 4LNC> Discussed concern that she has delirium which may imrpove, but my not get back to where she was before. She states she would want her to continue going back to the hospital, but understands if she does not improve would be reason to avoid agressive therapy, but for now would like to give an additional chance at recovering.   Past Medical History:  Diagnosis Date   Acute exacerbation of CHF (congestive heart failure) (HCC) 11/18/2021   AKI (acute kidney injury) (HCC) 11/26/2021   Anemia    CHF (congestive  heart failure) (HCC)    Diabetes mellitus without complication (HCC) 03/13/2017   Currently no high BS.  Has come off meds.  But having episodes of low BS now.   Dilated idiopathic cardiomyopathy (HCC)    Dizziness 02/18/2017   GERD (gastroesophageal reflux disease)    Hyperkalemia 11/26/2021   Hyperlipidemia    Hypertension    Hyponatremia 06/09/2017   Hypothyroidism    Osteoporosis    Renal disorder    stage 3   Past Surgical History:  Procedure Laterality Date   BUNIONECTOMY     COLONOSCOPY     ESOPHAGOGASTRODUODENOSCOPY (EGD) WITH PROPOFOL N/A 10/15/2017   Procedure: ESOPHAGOGASTRODUODENOSCOPY (EGD) WITH PROPOFOL;  Surgeon: Christena Deem, MD;  Location: [facility] ENDOSCOPY;  Service: Endoscopy;  Laterality: N/A;   KYPHOPLASTY N/A 06/12/2017   Procedure: [id number];  Surgeon: Kennedy Bucker, MD;  Location: [facility] ORS;  Service: Orthopedics;  Laterality: N/A;   TONSILLECTOMY      reports that she quit smoking about 38 years ago. Her smoking use included cigarettes. She has never used smokeless tobacco. She reports that she does not drink alcohol and does not use drugs. Social History   Socioeconomic History   Marital status: Widowed    Spouse name: Not on file   Number of children: Not on file   Years of education: Not on file   Highest education level: Not on file  Occupational History   Not on file  Tobacco Use   Smoking status: Former  Current packs/day: 0.00    Types: Cigarettes    Quit date: 04/18/1984    Years since quitting: 38.8   Smokeless tobacco: Never  Vaping Use   Vaping status: Former   Start date: 05/16/1965   Quit date: 05/16/1989  Substance and Sexual Activity   Alcohol use: No   Drug use: No   Sexual activity: Never  Other Topics Concern   Not on file  Social History Narrative   Not on file   Social Determinants of Health   Financial Resource Strain: Not on file  Food Insecurity: No Food Insecurity (02/23/2023)   Hunger Vital Sign     Worried About Running Out of Food in the Last Year: Never true    Ran Out of Food in the Last Year: Never true  Transportation Needs: No Transportation Needs (02/23/2023)   PRAPARE - Administrator, Civil Service (Medical): No    Lack of Transportation (Non-Medical): No  Physical Activity: Not on file  Stress: Not on file  Social Connections: Not on file  Intimate Partner Violence: Not At Risk (02/23/2023)   Humiliation, Afraid, Rape, and Kick questionnaire    Fear of Current or Ex-Partner: No    Emotionally Abused: No    Physically Abused: No    Sexually Abused: No    Functional Status Survey:    Family History  Problem Relation Age of Onset   Premature CHD Brother     Health Maintenance  Topic Date Due   INFLUENZA VACCINE  01/15/2023   COVID-19 Vaccine (6 - 2023-24 season) 02/15/2023   Zoster Vaccines- Shingrix (2 of 2) 10/23/2023 (Originally 06/24/2022)   HEMOGLOBIN A1C  05/02/2023   OPHTHALMOLOGY EXAM  08/25/2023   Medicare Annual Wellness (AWV)  10/23/2023   FOOT EXAM  12/09/2023   Pneumonia Vaccine 43+ Years old  Completed   DEXA SCAN  Completed   HPV VACCINES  Aged Out   DTaP/Tdap/Td  Discontinued    Allergies  Allergen Reactions   Ciprofloxacin    Digoxin And Related    Entresto [Sacubitril-Valsartan] Other (See Comments)    Hypotensive    Erythromycin    Lopid [Gemfibrozil]    Nitrofurantoin    Spironolactone Other (See Comments)    Hospitalized for dehydration   Cefuroxime Rash   Penicillins Rash    Has patient had a PCN reaction causing immediate rash, facial/tongue/throat swelling, SOB or lightheadedness with hypotension: Unknown Has patient had a PCN reaction causing severe rash involving mucus membranes or skin necrosis: No Has patient had a PCN reaction that required hospitalization: No Has patient had a PCN reaction occurring within the last 10 years: No If all of the above answers are "NO", then may proceed with Cephalosporin use.      Outpatient Encounter Medications as of 02/27/2023  Medication Sig   acetaminophen (TYLENOL) 325 MG tablet Take 2 tablets (650 mg total) by mouth every 4 (four) hours as needed for headache or mild pain.   alendronate (FOSAMAX) 70 MG tablet Take 70 mg by mouth once a week. Take with a full glass of water on an empty stomach.   apixaban (ELIQUIS) 2.5 MG TABS tablet Take 1 tablet (2.5 mg total) by mouth 2 (two) times daily. Start on 11/30/2021   ARTIFICIAL TEAR SOLUTION OP Apply 1 drop to eye 2 (two) times daily. Both Eyes.   atorvastatin (LIPITOR) 80 MG tablet Take 1 tablet (80 mg total) by mouth daily.   calcium elemental as carbonate (TUMS  ULTRA 1000) 400 MG chewable tablet Chew 1,000 mg by mouth 3 (three) times daily as needed for heartburn.   cefpodoxime (VANTIN) 200 MG tablet Take 1 tablet (200 mg total) by mouth every 12 (twelve) hours for 4 days.   Dextromethorphan-guaiFENesin 20-400 MG TABS Take 1 tablet by mouth 2 (two) times daily.   diclofenac Sodium (VOLTAREN) 1 % GEL Apply 2 g topically daily as needed (pain). Apply to back topically every day shift for pain.   ergocalciferol (VITAMIN D2) 50000 units capsule Take 50,000 Units by mouth once a week. Monday   ipratropium-albuterol (DUONEB) 0.5-2.5 (3) MG/3ML SOLN Take 3 mLs by nebulization 3 (three) times daily as needed.   JARDIANCE 10 MG TABS tablet Take 10 mg by mouth daily.   levothyroxine (SYNTHROID, LEVOTHROID) 25 MCG tablet Take 25 mcg by mouth daily before breakfast.   magnesium hydroxide (MILK OF MAGNESIA) 400 MG/5ML suspension Take 30 mLs by mouth daily as needed for mild constipation.   OXYGEN 4lpm via [place] for SOB continuous.   pantoprazole (PROTONIX) 40 MG tablet Take 40 mg by mouth in the morning and at bedtime.   polyethylene glycol (MIRALAX / GLYCOLAX) 17 g packet Take 17 g by mouth every other day.   senna-docusate (SENOKOT-S) 8.6-50 MG tablet Take 2 tablets by mouth daily.   sertraline (ZOLOFT) 50 MG tablet Take 50 mg  by mouth daily.   torsemide (DEMADEX) 10 MG tablet Take 20 mg by mouth daily.   valsartan (DIOVAN) 80 MG tablet Take 160 mg by mouth daily.   No facility-administered encounter medications on file as of 02/27/2023.    Review of Systems  Vitals:   02/27/23 0955  BP: 106/60  Pulse: 85  Resp: 17  Temp: (!) 97.5 F (36.4 C)  SpO2: 95%  Weight: 117 lb 12.8 oz (53.4 kg)  Height: 4\' 9"  (1.448 m)   Body mass index is 25.49 kg/m. Physical Exam Constitutional:      Comments: Sleeping, but awakens to name.   Cardiovascular:     Rate and Rhythm: Normal rate. Rhythm irregular.     Pulses: Normal pulses.  Pulmonary:     Effort: Pulmonary effort is normal.     Comments: 4LNC Abdominal:     General: Abdomen is flat.     Palpations: Abdomen is soft.  Skin:    General: Skin is warm and dry.  Neurological:     General: No focal deficit present.     Mental Status: She is disoriented.     Labs reviewed: Basic Metabolic Panel: Recent Labs    02/22/23 0745 02/23/23 0213 02/25/23 0957  NA 135 138 138  K 3.7 3.5 3.5  CL 100 102 104  CO2 25 26 26   GLUCOSE 145* 111* 157*  BUN 26* 24* 23  CREATININE 1.04* 0.94 0.79  CALCIUM 8.7* 8.2* 8.2*   Liver Function Tests: Recent Labs    08/18/22 0000 02/22/23 0745  AST 28 20  ALT 21 15  ALKPHOS 89 91  BILITOT  --  1.1  PROT  --  7.9  ALBUMIN 3.7 3.8   No results for input(s): "LIPASE", "AMYLASE" in the last 8760 hours. No results for input(s): "AMMONIA" in the last 8760 hours. CBC: Recent Labs    02/05/23 0000 02/19/23 0000 02/22/23 0745 02/23/23 0213 02/25/23 0957  WBC 6.5 9.6 15.0* 10.9* 8.2  NEUTROABS 4,381.00 7,776.00  --   --  6.0  HGB 13.7 12.5 14.7 13.1 13.9  HCT 44 40 47.0*  41.6 45.0  MCV  --   --  84.7 83.2 84.0  PLT 163 222 249 210 237   Cardiac Enzymes: No results for input(s): "CKTOTAL", "CKMB", "CKMBINDEX", "TROPONINI" in the last 8760 hours. BNP: Invalid input(s): "POCBNP" Lab Results  Component  Value Date   HGBA1C 6.5 10/30/2022   Lab Results  Component Value Date   TSH 2.925 11/18/2021   No results found for: "VITAMINB12" No results found for: "FOLATE" No results found for: "IRON", "TIBC", "FERRITIN"  Imaging and Procedures obtained prior to SNF admission: DG Chest Portable 1 View  Result Date: 02/22/2023 CLINICAL DATA:  89 year old female with history of shortness of breath. EXAM: PORTABLE CHEST 1 VIEW COMPARISON:  Chest x-ray 11/26/2021. FINDINGS: Lung volumes are normal. No consolidative airspace disease. No pleural effusions. No pneumothorax. No pulmonary nodule or mass noted. Pulmonary vasculature and the cardiomediastinal silhouette are within normal limits. Atherosclerosis in the thoracic aorta. Sclerotic lesion in the left proximal humerus, similar to prior studies, likely an enchondroma. IMPRESSION: 1.  No radiographic evidence of acute cardiopulmonary disease. 2. Aortic atherosclerosis. Electronically Signed   By: Trudie Reed M.D.   On: 02/22/2023 08:36    Assessment/Plan Pneumonia of both lungs due to infectious organism, unspecified part of lung  Delirium  Chronic atrial fibrillation (HCC)  Chronic respiratory failure with hypoxia (HCC)  CKD stage 3 due to type 2 diabetes mellitus (HCC)  Hepatic cirrhosis, unspecified hepatic cirrhosis type, unspecified whether ascites present (HCC)  Primary hypertension  Hx of myocardial infarction  Acquired hypothyroidism  Recurrent major depressive disorder, in partial remission (HCC) Patient is a long-term care resident of [facility] with a past medical history atrial fibrillation, chronic hypoxemic respiratory failure on 4 L, hypertension, history of myocardial infarction, history of strokes, history of cirrhosis, history of hypothyroidism and depression who is admitted for post viral pneumonia after having COVID 19 infection.  Patient completed antibiotics upon return.  Patient with notable delirium on exam as she  is awake however not interactive.  Discussed concern for delirium and its potential effects on cognition as well as ability to maintain adequate p.o. nutrition over the upcoming days.  Encouraged to continue reorientation and reestablish routine upon return to facility.  Appears euvolemic on exam continue home torsemide.  Contributing factors could have been urinary retention as well as infection and numerous underlying chronic illnesses such as A-fib, chronic hypoxia, and history of strokes with dementia.  Continue supportive care and encourage adequate sleep at night.  Physical therapy to resume made with patient's recovery.  Continue Synthroid, LABA, ICS, Eliquis, Jardiance for chronic diseases.   Family/ staff Communication: HCPOA  Labs/tests ordered: CBC BMP

## 2023-03-01 ENCOUNTER — Encounter: Payer: Self-pay | Admitting: Student

## 2023-03-02 ENCOUNTER — Other Ambulatory Visit: Payer: Self-pay | Admitting: Student

## 2023-03-02 LAB — BASIC METABOLIC PANEL
BUN: 46 — AB (ref 4–21)
CO2: 25 — AB (ref 13–22)
Chloride: 99 (ref 99–108)
Creatinine: 1.1 (ref 0.5–1.1)
Glucose: 100
Potassium: 3.8 mEq/L (ref 3.5–5.1)
Sodium: 134 — AB (ref 137–147)

## 2023-03-02 LAB — CBC AND DIFFERENTIAL
HCT: 42 (ref 36–46)
Hemoglobin: 13.3 (ref 12.0–16.0)
Neutrophils Absolute: 4290
Platelets: 190 10*3/uL (ref 150–400)
WBC: 6.6

## 2023-03-02 LAB — CBC: RBC: 5.11 (ref 3.87–5.11)

## 2023-03-02 LAB — COMPREHENSIVE METABOLIC PANEL
Calcium: 8.3 — AB (ref 8.7–10.7)
eGFR: 47

## 2023-03-02 NOTE — Progress Notes (Signed)
Patient unavailable for evaluation. 5lb weight gain in 1 week. Plan to repeat echocardiogram. 1 year ago 45% EF. Lasix Prn ofr 5lb in 1wk and 3 lb overnight.

## 2023-03-06 ENCOUNTER — Encounter: Payer: Self-pay | Admitting: Student

## 2023-03-06 ENCOUNTER — Non-Acute Institutional Stay (SKILLED_NURSING_FACILITY): Payer: Self-pay | Admitting: Student

## 2023-03-06 DIAGNOSIS — I10 Essential (primary) hypertension: Secondary | ICD-10-CM | POA: Diagnosis not present

## 2023-03-06 DIAGNOSIS — L89152 Pressure ulcer of sacral region, stage 2: Secondary | ICD-10-CM

## 2023-03-06 DIAGNOSIS — I5022 Chronic systolic (congestive) heart failure: Secondary | ICD-10-CM | POA: Diagnosis not present

## 2023-03-06 NOTE — Progress Notes (Unsigned)
Location:  Other [facility].  Nursing Home Room Number: [facility] [address]A Place of Service:  SNF [phone]) Provider:  Earnestine Mealing, MD  Patient Care Team: Earnestine Mealing, MD as PCP - General (Family Medicine) Benita Gutter, RN as Registered Nurse  Extended Emergency Contact Information Primary Emergency Contact: [facility] Phone: [phone] Relation: Son Secondary Emergency Contact: [name] Address: [address]          [place], [place] [address] [name] of [place] Home Phone: [phone] Mobile Phone: [phone] Relation: Daughter  Code Status:  DNR Goals of care: Advanced Directive information    03/06/2023   10:44 AM  Advanced Directives  Does Patient Have a Medical Advance Directive? Yes  Type of Advance Directive Out of facility DNR (pink MOST or yellow form)  Does patient want to make changes to medical advance directive? No - Patient declined     Chief Complaint  Patient presents with   Acute Visit    Blood Pressure Management.     HPI:  Pt is a 89 y.o. female seen today for an acute visit for Blood Pressure Management. Patient is pleasant and in no acute distress.   Nursing notes notable for new stage 2 pressure injury.    Past Medical History:  Diagnosis Date   Acute exacerbation of CHF (congestive heart failure) (HCC) 11/18/2021   AKI (acute kidney injury) (HCC) 11/26/2021   Anemia    CHF (congestive heart failure) (HCC)    Diabetes mellitus without complication (HCC) 03/13/2017   Currently no high BS.  Has come off meds.  But having episodes of low BS now.   Dilated idiopathic cardiomyopathy (HCC)    Dizziness 02/18/2017   GERD (gastroesophageal reflux disease)    Hyperkalemia 11/26/2021   Hyperlipidemia    Hypertension    Hyponatremia 06/09/2017   Hypothyroidism    Osteoporosis    Renal disorder    stage 3   Past Surgical History:  Procedure Laterality Date   BUNIONECTOMY     COLONOSCOPY      ESOPHAGOGASTRODUODENOSCOPY (EGD) WITH PROPOFOL N/A 10/15/2017   Procedure: ESOPHAGOGASTRODUODENOSCOPY (EGD) WITH PROPOFOL;  Surgeon: Christena Deem, MD;  Location: [facility] ENDOSCOPY;  Service: Endoscopy;  Laterality: N/A;   KYPHOPLASTY N/A 06/12/2017   Procedure: [id number];  Surgeon: Kennedy Bucker, MD;  Location: [facility] ORS;  Service: Orthopedics;  Laterality: N/A;   TONSILLECTOMY      Allergies  Allergen Reactions   Ciprofloxacin    Digoxin And Related    Entresto [Sacubitril-Valsartan] Other (See Comments)    Hypotensive    Erythromycin    Lopid [Gemfibrozil]    Nitrofurantoin    Spironolactone Other (See Comments)    Hospitalized for dehydration   Cefuroxime Rash   Penicillins Rash    Has patient had a PCN reaction causing immediate rash, facial/tongue/throat swelling, SOB or lightheadedness with hypotension: Unknown Has patient had a PCN reaction causing severe rash involving mucus membranes or skin necrosis: No Has patient had a PCN reaction that required hospitalization: No Has patient had a PCN reaction occurring within the last 10 years: No If all of the above answers are "NO", then may proceed with Cephalosporin use.     Outpatient Encounter Medications as of 03/06/2023  Medication Sig   acetaminophen (TYLENOL) 325 MG tablet Take 2 tablets (650 mg total) by mouth every 4 (four) hours as needed for headache or mild pain.   alendronate (FOSAMAX) 70 MG tablet Take 70 mg by mouth once a week. Take  with a full glass of water on an empty stomach.   apixaban (ELIQUIS) 2.5 MG TABS tablet Take 1 tablet (2.5 mg total) by mouth 2 (two) times daily. Start on 11/30/2021   ARTIFICIAL TEAR SOLUTION OP Apply 1 drop to eye 2 (two) times daily. Both Eyes.   atorvastatin (LIPITOR) 80 MG tablet Take 1 tablet (80 mg total) by mouth daily.   calcium elemental as carbonate (TUMS ULTRA 1000) 400 MG chewable tablet Chew 1,000 mg by mouth 3 (three) times daily as needed for heartburn.    Dextromethorphan-guaiFENesin 20-400 MG TABS Take 1 tablet by mouth 2 (two) times daily.   diclofenac Sodium (VOLTAREN) 1 % GEL Apply 2 g topically daily as needed (pain). Apply to back topically every day shift for pain.   ergocalciferol (VITAMIN D2) 50000 units capsule Take 50,000 Units by mouth once a week. Monday   furosemide (LASIX) 40 MG tablet Take 40 mg by mouth daily. Every 24 hours as needed for Weight gain of 5lbs in 1 week or 3lbs overnight.   ipratropium-albuterol (DUONEB) 0.5-2.5 (3) MG/3ML SOLN Take 3 mLs by nebulization 3 (three) times daily as needed.   JARDIANCE 10 MG TABS tablet Take 10 mg by mouth daily.   levothyroxine (SYNTHROID, LEVOTHROID) 25 MCG tablet Take 25 mcg by mouth daily before breakfast.   magnesium hydroxide (MILK OF MAGNESIA) 400 MG/5ML suspension Take 30 mLs by mouth daily as needed for mild constipation.   OXYGEN 4lpm via [place] for SOB continuous.   pantoprazole (PROTONIX) 40 MG tablet Take 40 mg by mouth in the morning and at bedtime.   polyethylene glycol (MIRALAX / GLYCOLAX) 17 g packet Take 17 g by mouth every other day.   senna-docusate (SENOKOT-S) 8.6-50 MG tablet Take 2 tablets by mouth daily.   sertraline (ZOLOFT) 50 MG tablet Take 50 mg by mouth daily.   torsemide (DEMADEX) 10 MG tablet Take 20 mg by mouth daily.   valsartan (DIOVAN) 80 MG tablet Take 160 mg by mouth daily.   No facility-administered encounter medications on file as of 03/06/2023.    Review of Systems  Immunization History  Administered Date(s) Administered   Influenza Inj Mdck Quad Pf 03/10/2019, 03/18/2021   Influenza Split 04/18/2014, 05/01/2015   Influenza, High Dose Seasonal PF 04/01/2022   Influenza,inj,Quad PF,6+ Mos 03/12/2020   Influenza-Unspecified 03/24/2012, 03/24/2013, 04/18/2014, 05/01/2015   PFIZER Comirnaty(Gray Top)Covid-19 Tri-Sucrose Vaccine 07/21/2019, 08/12/2019   PNEUMOCOCCAL CONJUGATE-20 04/29/2022   Pfizer Covid-19 Vaccine Bivalent Booster 73yrs & up  11/22/2021   Pneumococcal Polysaccharide-23 08/20/2015, 04/29/2022   Unspecified SARS-COV-2 Vaccination 07/21/2019, 08/11/2019   Zoster Recombinant(Shingrix) 04/29/2022   Pertinent  Health Maintenance Due  Topic Date Due   INFLUENZA VACCINE  01/15/2023   HEMOGLOBIN A1C  05/02/2023   OPHTHALMOLOGY EXAM  08/25/2023   FOOT EXAM  12/09/2023   DEXA SCAN  Completed      11/28/2021    7:57 PM 11/29/2021    7:59 AM 12/18/2021   12:34 PM 08/14/2022   11:35 AM 10/23/2022    2:27 PM  Fall Risk  Falls in the past year?   0 0 0  Was there an injury with Fall?   0 0   Fall Risk Category Calculator   0 0   Fall Risk Category (Retired)   Low    (RETIRED) Patient Fall Risk Level High fall risk High fall risk Moderate fall risk    Patient at Risk for Falls Due to   Impaired mobility;Impaired balance/gait No  Fall Risks   Fall risk Follow up   Falls evaluation completed;Education provided;Falls prevention discussed Falls evaluation completed    Functional Status Survey:    Vitals:   03/06/23 1037  BP: 107/61  Pulse: 84  Resp: (!) 22  Temp: (!) 97.4 F (36.3 C)  SpO2: 95%  Weight: 120 lb 9.6 oz (54.7 kg)  Height: 4\' 9"  (1.448 m)   Body mass index is 26.1 kg/m. Physical Exam Cardiovascular:     Rate and Rhythm: Tachycardia present.  Pulmonary:     Effort: Pulmonary effort is normal.     Comments: 4LNC Genitourinary:    Comments: deferred Neurological:     Mental Status: She is alert. Mental status is at baseline.     Labs reviewed: Recent Labs    02/22/23 0745 02/23/23 0213 02/25/23 0957 03/02/23 0000  NA 135 138 138 134*  K 3.7 3.5 3.5 3.8  CL 100 102 104 99  CO2 25 26 26  25*  GLUCOSE 145* 111* 157*  --   BUN 26* 24* 23 46*  CREATININE 1.04* 0.94 0.79 1.1  CALCIUM 8.7* 8.2* 8.2* 8.3*   Recent Labs    08/18/22 0000 02/22/23 0745  AST 28 20  ALT 21 15  ALKPHOS 89 91  BILITOT  --  1.1  PROT  --  7.9  ALBUMIN 3.7 3.8   Recent Labs    02/19/23 0000 02/19/23 0000  02/22/23 0745 02/23/23 0213 02/25/23 0957 03/02/23 0000  WBC 9.6   < > 15.0* 10.9* 8.2 6.6  NEUTROABS 7,776.00  --   --   --  6.0 4,290.00  HGB 12.5  --  14.7 13.1 13.9 13.3  HCT 40  --  47.0* 41.6 45.0 42  MCV  --   --  84.7 83.2 84.0  --   PLT 222  --  249 210 237 190   < > = values in this interval not displayed.   Lab Results  Component Value Date   TSH 2.925 11/18/2021   Lab Results  Component Value Date   HGBA1C 6.5 10/30/2022   Lab Results  Component Value Date   CHOL 146 07/17/2022   HDL 38 07/17/2022   LDLCALC 77 07/17/2022   TRIG 226 (A) 07/17/2022   CHOLHDL 7.7 11/27/2021    Significant Diagnostic Results in last 30 days:  DG Chest Portable 1 View  Result Date: 02/22/2023 CLINICAL DATA:  89 year old female with history of shortness of breath. EXAM: PORTABLE CHEST 1 VIEW COMPARISON:  Chest x-ray 11/26/2021. FINDINGS: Lung volumes are normal. No consolidative airspace disease. No pleural effusions. No pneumothorax. No pulmonary nodule or mass noted. Pulmonary vasculature and the cardiomediastinal silhouette are within normal limits. Atherosclerosis in the thoracic aorta. Sclerotic lesion in the left proximal humerus, similar to prior studies, likely an enchondroma. IMPRESSION: 1.  No radiographic evidence of acute cardiopulmonary disease. 2. Aortic atherosclerosis. Electronically Signed   By: Trudie Reed M.D.   On: 02/22/2023 08:36    Assessment/Plan Chronic systolic CHF (congestive heart failure), NYHA class 3 (HCC)  Primary hypertension  Pressure injury of coccygeal region, stage 2 (HCC) Patient appears euvolemic, however, has needed additional doses of lasix. At this time patient with numerous BP less than 100/70, will decrease valsartan to 80 mg daily. Based on response, may need further decrease in medication. New sacral wound, encourage protein intake. Prosource daily.  Unable to assess as patient is finishing breakfast.   Family/ staff Communication:  nursing  Labs/tests ordered:  none

## 2023-03-09 ENCOUNTER — Encounter: Payer: Self-pay | Admitting: Family

## 2023-03-09 ENCOUNTER — Ambulatory Visit: Payer: Medicare Other | Attending: Family | Admitting: Family

## 2023-03-09 VITALS — BP 105/51 | HR 53 | Ht <= 58 in

## 2023-03-09 DIAGNOSIS — E785 Hyperlipidemia, unspecified: Secondary | ICD-10-CM | POA: Diagnosis not present

## 2023-03-09 DIAGNOSIS — E119 Type 2 diabetes mellitus without complications: Secondary | ICD-10-CM | POA: Diagnosis not present

## 2023-03-09 DIAGNOSIS — I10 Essential (primary) hypertension: Secondary | ICD-10-CM | POA: Diagnosis not present

## 2023-03-09 DIAGNOSIS — I130 Hypertensive heart and chronic kidney disease with heart failure and stage 1 through stage 4 chronic kidney disease, or unspecified chronic kidney disease: Secondary | ICD-10-CM | POA: Diagnosis not present

## 2023-03-09 DIAGNOSIS — I480 Paroxysmal atrial fibrillation: Secondary | ICD-10-CM | POA: Insufficient documentation

## 2023-03-09 DIAGNOSIS — I252 Old myocardial infarction: Secondary | ICD-10-CM | POA: Diagnosis present

## 2023-03-09 DIAGNOSIS — I428 Other cardiomyopathies: Secondary | ICD-10-CM | POA: Diagnosis not present

## 2023-03-09 DIAGNOSIS — M810 Age-related osteoporosis without current pathological fracture: Secondary | ICD-10-CM | POA: Diagnosis present

## 2023-03-09 DIAGNOSIS — Z888 Allergy status to other drugs, medicaments and biological substances status: Secondary | ICD-10-CM | POA: Insufficient documentation

## 2023-03-09 DIAGNOSIS — I5032 Chronic diastolic (congestive) heart failure: Secondary | ICD-10-CM | POA: Diagnosis not present

## 2023-03-09 DIAGNOSIS — N189 Chronic kidney disease, unspecified: Secondary | ICD-10-CM | POA: Insufficient documentation

## 2023-03-09 DIAGNOSIS — I5022 Chronic systolic (congestive) heart failure: Secondary | ICD-10-CM | POA: Diagnosis not present

## 2023-03-09 DIAGNOSIS — E1122 Type 2 diabetes mellitus with diabetic chronic kidney disease: Secondary | ICD-10-CM | POA: Diagnosis not present

## 2023-03-09 LAB — CBC: RBC: 4.73 (ref 3.87–5.11)

## 2023-03-09 LAB — CBC AND DIFFERENTIAL
HCT: 41 (ref 36–46)
Hemoglobin: 12.3 (ref 12.0–16.0)
Neutrophils Absolute: 2974
Platelets: 171 10*3/uL (ref 150–400)
WBC: 5.2

## 2023-03-09 NOTE — Progress Notes (Signed)
PCP: Earnestine Mealing, MD (last seen 09/24) Primary Cardiologist: Marcina Millard, MD (last seen 08/24; returns 12/24)  HPI:   Ms Simard is a 89 y/o female with a history of NSTEMI 11/2021, T2DM, paroxysmal atrial fibrillation, CVA 11/2021 hyperlipidemia, HTN, CKD, thyroid disease, anemia, GERD, hyperkalemia, hypothyroidism 03/2017, osteoporosis and chronic heart failure.  Admitted 11/18/21 due to acute on chronic hypoxic respiratory failure initially requiring biPAP due to actue on chronic HF. Initially given IV lasix with transition to oral diuretics. Cardiology consult obtained.  PT/OT evaluations done. Discharged after 7 days to SNF. Admitted 11/26/21 due to lethargy, slurred speech diaphoresis with left shoulder pain radiating to the arm and back pain.  MRI brain was obtained and showed acute right cerebellar infarct. Troponin elevated. Neurology, cardiology and palliative care consults obtained. Diagnosed with NSTEMI but heparin deferred due to stroke. Found to have new onset AF. 6/15: repeated head CT given recurrent episode on minimal responsiveness, worsened speech and diaphoresis, no hemorrhagic conversion. Discharged after 3 days.   Admitted 02/22/23 due to cough and SOB, O2 sats in the 70s. She was previously diagnosed with COVID on 8/22 and treated with Paxlovid for 5 days. She was diagnosed with post-viral bacterial infection and treated with Ceftriaxone and Azithromycin. Chest x-ray negative for pneumonia. 1 dose of IV lasix given. Trop elevation 49>53, pattern is flat, likely demanding ischemia from hypoxia. Discharged to rehab at [facility].    Echo 06/02/17: EF 30-35% with mild LVH, mild AR, mild/ moderate MR/TR, mild LAE Echo 01/05/19: EF 55-60% with mild LAE Echo 11/18/21: EF 20-25% with severe LVH, Grade II DD, mild LAE, mild MR Echo 11/27/21: EF 40-45% along with mild LVH and mild MR.  Echo 03/06/23: EF 50% (@ [facility])  She presents today with her daughter for a HF f/u visit  with a chief complaint of moderate SOB with minimal exertion. Chronic in nature. Has associated intermittent chest pain, fatigue, dizziness, pedal edema, headache and productive persistent cough after covid along with this. Denies palpitations, abdominal distention or difficulty sleeping.   Wearing her oxygen at 3-4L around the clock. Currently wearing compression socks during the day and does have her legs elevated during the day. Facility med list shows that patient is taking both furosemide and torsemide.   ROS: All systems negative except as listed in HPI, PMH and Problem List.  SH:  Social History   Socioeconomic History   Marital status: Widowed    Spouse name: Not on file   Number of children: Not on file   Years of education: Not on file   Highest education level: Not on file  Occupational History   Not on file  Tobacco Use   Smoking status: Former    Current packs/day: 0.00    Types: Cigarettes    Quit date: 04/18/1984    Years since quitting: 38.9   Smokeless tobacco: Never  Vaping Use   Vaping status: Former   Start date: 05/16/1965   Quit date: 05/16/1989  Substance and Sexual Activity   Alcohol use: No   Drug use: No   Sexual activity: Never  Other Topics Concern   Not on file  Social History Narrative   Not on file   Social Determinants of Health   Financial Resource Strain: Not on file  Food Insecurity: No Food Insecurity (02/23/2023)   Hunger Vital Sign    Worried About Running Out of Food in the Last Year: Never true    Ran Out of Food in  the Last Year: Never true  Transportation Needs: No Transportation Needs (02/23/2023)   PRAPARE - Administrator, Civil Service (Medical): No    Lack of Transportation (Non-Medical): No  Physical Activity: Not on file  Stress: Not on file  Social Connections: Not on file  Intimate Partner Violence: Not At Risk (02/23/2023)   Humiliation, Afraid, Rape, and Kick questionnaire    Fear of Current or Ex-Partner: No     Emotionally Abused: No    Physically Abused: No    Sexually Abused: No    FH:  Family History  Problem Relation Age of Onset   Premature CHD Brother     Past Medical History:  Diagnosis Date   Acute exacerbation of CHF (congestive heart failure) (HCC) 11/18/2021   AKI (acute kidney injury) (HCC) 11/26/2021   Anemia    CHF (congestive heart failure) (HCC)    Diabetes mellitus without complication (HCC) 03/13/2017   Currently no high BS.  Has come off meds.  But having episodes of low BS now.   Dilated idiopathic cardiomyopathy (HCC)    Dizziness 02/18/2017   GERD (gastroesophageal reflux disease)    Hyperkalemia 11/26/2021   Hyperlipidemia    Hypertension    Hyponatremia 06/09/2017   Hypothyroidism    Osteoporosis    Renal disorder    stage 3    Current Outpatient Medications  Medication Sig Dispense Refill   acetaminophen (TYLENOL) 325 MG tablet Take 2 tablets (650 mg total) by mouth every 4 (four) hours as needed for headache or mild pain.     alendronate (FOSAMAX) 70 MG tablet Take 70 mg by mouth once a week. Take with a full glass of water on an empty stomach.     apixaban (ELIQUIS) 2.5 MG TABS tablet Take 1 tablet (2.5 mg total) by mouth 2 (two) times daily. Start on 11/30/2021 60 tablet    ARTIFICIAL TEAR SOLUTION OP Apply 1 drop to eye 2 (two) times daily. Both Eyes.     atorvastatin (LIPITOR) 80 MG tablet Take 1 tablet (80 mg total) by mouth daily.     calcium elemental as carbonate (TUMS ULTRA 1000) 400 MG chewable tablet Chew 1,000 mg by mouth 3 (three) times daily as needed for heartburn.     Dextromethorphan-guaiFENesin 20-400 MG TABS Take 1 tablet by mouth 2 (two) times daily.     diclofenac Sodium (VOLTAREN) 1 % GEL Apply 2 g topically daily as needed (pain). Apply to back topically every day shift for pain.     ergocalciferol (VITAMIN D2) 50000 units capsule Take 50,000 Units by mouth once a week. Monday     furosemide (LASIX) 40 MG tablet Take 40 mg by  mouth daily. Every 24 hours as needed for Weight gain of 5lbs in 1 week or 3lbs overnight.     ipratropium-albuterol (DUONEB) 0.5-2.5 (3) MG/3ML SOLN Take 3 mLs by nebulization 3 (three) times daily as needed. 360 mL 0   JARDIANCE 10 MG TABS tablet Take 10 mg by mouth daily.     levothyroxine (SYNTHROID, LEVOTHROID) 25 MCG tablet Take 25 mcg by mouth daily before breakfast.     magnesium hydroxide (MILK OF MAGNESIA) 400 MG/5ML suspension Take 30 mLs by mouth daily as needed for mild constipation.     OXYGEN 4lpm via [place] for SOB continuous.     pantoprazole (PROTONIX) 40 MG tablet Take 40 mg by mouth in the morning and at bedtime.     polyethylene glycol (MIRALAX / GLYCOLAX)  17 g packet Take 17 g by mouth every other day.     senna-docusate (SENOKOT-S) 8.6-50 MG tablet Take 2 tablets by mouth daily.     sertraline (ZOLOFT) 50 MG tablet Take 50 mg by mouth daily.     torsemide (DEMADEX) 10 MG tablet Take 20 mg by mouth daily.     valsartan (DIOVAN) 80 MG tablet Take 160 mg by mouth daily.     No current facility-administered medications for this visit.   Vitals:   03/09/23 1449  BP: (!) 105/51  Pulse: (!) 53  SpO2: 96%  Height: 4\' 9"  (1.448 m)   Wt Readings from Last 3 Encounters:  03/06/23 120 lb 9.6 oz (54.7 kg)  02/27/23 117 lb 12.8 oz (53.4 kg)  02/17/23 116 lb 12.8 oz (53 kg)   Lab Results  Component Value Date   CREATININE 1.1 03/02/2023   CREATININE 0.79 02/25/2023   CREATININE 0.94 02/23/2023   PHYSICAL EXAM:  General:  Well appearing. No resp difficulty HEENT: normal Neck: supple. JVP flat. No lymphadenopathy or thryomegaly appreciated. Cor: PMI normal. Regular rhythm, bradycardic. No rubs, gallops or murmurs. Lungs: clear Abdomen: soft, nontender, nondistended. No hepatosplenomegaly. No bruits or masses. Extremities: no cyanosis, clubbing, rash, 1+ pitting edema bilateral lower legs Neuro: alert & oriented x3, cranial nerves grossly intact. Moves all 4 extremities w/o  difficulty. Affect pleasant.   ECG: not done  ReDs: 31%   ASSESSMENT & PLAN:  1: NICM with preserved ejection fraction- - suspect due to atrial fibrillation - NYHA class III - euvolemic today - getting weighed daily at facility; reminded to call for an overnight weight gain of > 2 pounds or a weekly weight gain of > 5 pounds - ReDs 31% - Echo 06/02/17: EF 30-35% with mild LVH, mild AR, mild/ moderate MR/TR, mild LAE - Echo 01/05/19: EF 55-60% with mild LAE - Echo 11/18/21: EF 20-25% with severe LVH, Grade II DD, mild LAE, mild MR - Echo 11/27/21: EF 40-45% along with mild LVH and mild MR.  - continue jardiance 10mg  daily - d/c furosemide - continue torsemide 20mg  daily but increase slightly to 20mg  M, W, F & 40mg  the other days of the week - BMP/ BNP to be drawn in 10 days with results faxed to [place] - continue valsartan 160mg  daily - has allergy to spironolactone & entresto (hypotension) - has been hypotensive in the past so may not be able to tolerate beta-blocker - had video visit with palliative care on 04/23 - wearing oxygen at 3-4L around the clock - not adding any salt to her food - BNP 02/22/23 was 746.2  2: HTN- - BP 105/51 - saw PCP (Beamer) 09/24 - BMP 03/02/23 reviewed and showed sodium 134, potassium 3.8, creatinine 1.1 and GFR 47  3: DM- - A1c 10/30/22 was 6.5%  4: PAF- - saw cardiology (Paraschos) 08/24 - continue apixaban 2.5mg  BID  5: Hyperlipidemia- - continue atorvastatin 80mg  daily - LDL 07/17/22 was 77  Return in 1 month, sooner if needed.

## 2023-03-09 NOTE — Patient Instructions (Addendum)
START TAKING YOUR TORSEMIDE 20 MG ON MONDAY, WEDNESDAY, AND FRIDAY. TAKE 40 MG SUNDAY, TUESDAY, THURSDAY, AND SATURDAY.  GET YOUR BMP AND BNP LABS DRAWN 10 DAYS AFTER YOU START TORSEMIDE AND HAVE THE RESULTS FAXED OVER OR CALLED IN TO OUR CLINIC. PHONE- [phone]   FAX- [phone]  RETURN IN 1 MONTH

## 2023-03-19 LAB — BASIC METABOLIC PANEL
BUN: 28 — AB (ref 4–21)
CO2: 25 — AB (ref 13–22)
Chloride: 102 (ref 99–108)
Creatinine: 1.1 (ref 0.5–1.1)
Glucose: 75
Potassium: 3.3 meq/L — AB (ref 3.5–5.1)
Sodium: 141 (ref 137–147)

## 2023-03-19 LAB — COMPREHENSIVE METABOLIC PANEL
Calcium: 8.8 (ref 8.7–10.7)
eGFR: 49

## 2023-03-19 LAB — VITAMIN D 25 HYDROXY (VIT D DEFICIENCY, FRACTURES): Vit D, 25-Hydroxy: 46

## 2023-04-01 ENCOUNTER — Encounter: Payer: Self-pay | Admitting: Student

## 2023-04-01 ENCOUNTER — Non-Acute Institutional Stay (SKILLED_NURSING_FACILITY): Payer: Medicare Other | Admitting: Student

## 2023-04-01 DIAGNOSIS — I5022 Chronic systolic (congestive) heart failure: Secondary | ICD-10-CM

## 2023-04-01 DIAGNOSIS — I10 Essential (primary) hypertension: Secondary | ICD-10-CM

## 2023-04-01 DIAGNOSIS — N183 Chronic kidney disease, stage 3 unspecified: Secondary | ICD-10-CM

## 2023-04-01 DIAGNOSIS — I482 Chronic atrial fibrillation, unspecified: Secondary | ICD-10-CM | POA: Diagnosis not present

## 2023-04-01 DIAGNOSIS — E119 Type 2 diabetes mellitus without complications: Secondary | ICD-10-CM

## 2023-04-01 DIAGNOSIS — K7460 Unspecified cirrhosis of liver: Secondary | ICD-10-CM

## 2023-04-01 DIAGNOSIS — J9611 Chronic respiratory failure with hypoxia: Secondary | ICD-10-CM | POA: Diagnosis not present

## 2023-04-01 DIAGNOSIS — E1122 Type 2 diabetes mellitus with diabetic chronic kidney disease: Secondary | ICD-10-CM

## 2023-04-01 DIAGNOSIS — L89152 Pressure ulcer of sacral region, stage 2: Secondary | ICD-10-CM

## 2023-04-01 DIAGNOSIS — F3341 Major depressive disorder, recurrent, in partial remission: Secondary | ICD-10-CM

## 2023-04-01 NOTE — Progress Notes (Unsigned)
Location:  Other [place].  Nursing Home Room Number: [facility] [address] Place of Service:  SNF [phone]) Provider:  Earnestine Mealing, MD  Patient Care Team: Earnestine Mealing, MD as PCP - General (Family Medicine) Benita Gutter, RN as Registered Nurse  Extended Emergency Contact Information Primary Emergency Contact: [facility] Phone: [phone] Relation: Son Secondary Emergency Contact: [name] Address: [address]          [place], [place] [address] [name] Home Phone: [phone] Mobile Phone: [phone] Relation: Daughter  Code Status:  DNR Goals of care: Advanced Directive information    04/01/2023    9:55 AM  Advanced Directives  Does Patient Have a Medical Advance Directive? Yes  Type of Advance Directive Out of facility DNR (pink MOST or yellow form)  Does patient want to make changes to medical advance directive? No - Patient declined     Chief Complaint  Patient presents with   Medical Management of Chronic Issues    Medical Management of Chronic Issues.     HPI:  Pt is a 89 y.o. female seen today for medical management of chronic diseases.    Patient states she is doing okay she would like to go to the music in the grand room if possible.  She was supposed to go at 1030 and now she is late.  She denies pain at this time she denies shortness of breath.  She starting to feel a bit better.  Nursing states patient is closer to baseline functional status more engaging for meals as well as participating in daily activities.  Patient had couple weeks of low activity due to deconditioning in the setting of recent hospitalization.  On baseline oxygen requirement.  Requires assistance for transitions.  In physical therapy again at this time.  Past Medical History:  Diagnosis Date   Acute exacerbation of CHF (congestive heart failure) (HCC) 11/18/2021   AKI (acute kidney injury) (HCC) 11/26/2021   Anemia    CHF (congestive heart  failure) (HCC)    Diabetes mellitus without complication (HCC) 03/13/2017   Currently no high BS.  Has come off meds.  But having episodes of low BS now.   Dilated idiopathic cardiomyopathy (HCC)    Dizziness 02/18/2017   GERD (gastroesophageal reflux disease)    Hyperkalemia 11/26/2021   Hyperlipidemia    Hypertension    Hyponatremia 06/09/2017   Hypothyroidism    Osteoporosis    Renal disorder    stage 3   Past Surgical History:  Procedure Laterality Date   BUNIONECTOMY     COLONOSCOPY     ESOPHAGOGASTRODUODENOSCOPY (EGD) WITH PROPOFOL N/A 10/15/2017   Procedure: ESOPHAGOGASTRODUODENOSCOPY (EGD) WITH PROPOFOL;  Surgeon: Christena Deem, MD;  Location: [facility] ENDOSCOPY;  Service: Endoscopy;  Laterality: N/A;   KYPHOPLASTY N/A 06/12/2017   Procedure: [id number];  Surgeon: Kennedy Bucker, MD;  Location: [facility] ORS;  Service: Orthopedics;  Laterality: N/A;   TONSILLECTOMY      Allergies  Allergen Reactions   Ciprofloxacin    Digoxin And Related    Entresto [Sacubitril-Valsartan] Other (See Comments)    Hypotensive    Erythromycin    Lopid [Gemfibrozil]    Nitrofurantoin    Spironolactone Other (See Comments)    Hospitalized for dehydration   Cefuroxime Rash   Penicillins Rash    Has patient had a PCN reaction causing immediate rash, facial/tongue/throat swelling, SOB or lightheadedness with hypotension: Unknown Has patient had a PCN reaction causing severe rash involving mucus membranes or  skin necrosis: No Has patient had a PCN reaction that required hospitalization: No Has patient had a PCN reaction occurring within the last 10 years: No If all of the above answers are "NO", then may proceed with Cephalosporin use.     Outpatient Encounter Medications as of 04/01/2023  Medication Sig   acetaminophen (TYLENOL) 325 MG tablet Take 2 tablets (650 mg total) by mouth every 4 (four) hours as needed for headache or mild pain.   alendronate (FOSAMAX) 70 MG tablet Take 70 mg  by mouth once a week. Take with a full glass of water on an empty stomach.   apixaban (ELIQUIS) 2.5 MG TABS tablet Take 1 tablet (2.5 mg total) by mouth 2 (two) times daily. Start on 11/30/2021   ARTIFICIAL TEAR SOLUTION OP Apply 1 drop to eye 2 (two) times daily. Both Eyes.   atorvastatin (LIPITOR) 80 MG tablet Take 1 tablet (80 mg total) by mouth daily.   calcium elemental as carbonate (TUMS ULTRA 1000) 400 MG chewable tablet Chew 1,000 mg by mouth 3 (three) times daily as needed for heartburn.   Dextromethorphan-guaiFENesin 20-400 MG TABS Take 1 tablet by mouth 2 (two) times daily.   diclofenac Sodium (VOLTAREN) 1 % GEL Apply 2 g topically daily as needed (pain). Apply to back topically every day shift for pain.   ergocalciferol (VITAMIN D2) 50000 units capsule Take 50,000 Units by mouth once a week. Monday   ipratropium-albuterol (DUONEB) 0.5-2.5 (3) MG/3ML SOLN Take 3 mLs by nebulization 3 (three) times daily as needed.   JARDIANCE 10 MG TABS tablet Take 10 mg by mouth daily.   levothyroxine (SYNTHROID, LEVOTHROID) 25 MCG tablet Take 25 mcg by mouth daily before breakfast.   magnesium hydroxide (MILK OF MAGNESIA) 400 MG/5ML suspension Take 30 mLs by mouth daily as needed for mild constipation.   OXYGEN 4lpm via  for SOB continuous.   pantoprazole (PROTONIX) 40 MG tablet Take 40 mg by mouth in the morning and at bedtime.   polyethylene glycol (MIRALAX / GLYCOLAX) 17 g packet Take 17 g by mouth every other day.   senna-docusate (SENOKOT-S) 8.6-50 MG tablet Take 2 tablets by mouth daily.   sertraline (ZOLOFT) 50 MG tablet Take 50 mg by mouth daily.   torsemide (DEMADEX) 10 MG tablet Take 20 mg by mouth daily. Tue,Thu,Sat, Sun   valsartan (DIOVAN) 80 MG tablet Take 80 mg by mouth daily.   [DISCONTINUED] furosemide (LASIX) 40 MG tablet Take 40 mg by mouth daily. Every 24 hours as needed for Weight gain of 5lbs in 1 week or 3lbs overnight.   No facility-administered encounter medications on file  as of 04/01/2023.    Review of Systems  Immunization History  Administered Date(s) Administered   Influenza Inj Mdck Quad Pf 03/10/2019, 03/18/2021   Influenza Split 04/18/2014, 05/01/2015   Influenza, High Dose Seasonal PF 04/01/2022   Influenza,inj,Quad PF,6+ Mos 03/12/2020   Influenza-Unspecified 03/24/2012, 03/24/2013, 04/18/2014, 05/01/2015   PFIZER Comirnaty(Gray Top)Covid-19 Tri-Sucrose Vaccine 07/21/2019, 08/12/2019   PNEUMOCOCCAL CONJUGATE-20 04/29/2022   Pfizer Covid-19 Vaccine Bivalent Booster 34yrs & up 11/22/2021   Pneumococcal Polysaccharide-23 08/20/2015, 04/29/2022   Unspecified SARS-COV-2 Vaccination 07/21/2019, 08/11/2019   Zoster Recombinant(Shingrix) 04/29/2022   Pertinent  Health Maintenance Due  Topic Date Due   INFLUENZA VACCINE  01/15/2023   HEMOGLOBIN A1C  05/02/2023   OPHTHALMOLOGY EXAM  08/25/2023   FOOT EXAM  12/09/2023   DEXA SCAN  Completed      11/28/2021    7:57 PM 11/29/2021  7:59 AM 12/18/2021   12:34 PM 08/14/2022   11:35 AM 10/23/2022    2:27 PM  Fall Risk  Falls in the past year?   0 0 0  Was there an injury with Fall?   0 0   Fall Risk Category Calculator   0 0   Fall Risk Category (Retired)   Low    (RETIRED) Patient Fall Risk Level High fall risk High fall risk Moderate fall risk    Patient at Risk for Falls Due to   Impaired mobility;Impaired balance/gait No Fall Risks   Fall risk Follow up   Falls evaluation completed;Education provided;Falls prevention discussed Falls evaluation completed    Functional Status Survey:    Vitals:   04/01/23 0947  BP: 117/61  Pulse: 76  Resp: 18  Temp: (!) 97.5 F (36.4 C)  SpO2: 95%  Weight: 118 lb (53.5 kg)  Height: 4\' 9"  (1.448 m)   Body mass index is 25.53 kg/m. Physical Exam Constitutional:      Comments: 4 L nasal cannula in place.  HENT:     Mouth/Throat:     Mouth: Mucous membranes are moist.  Cardiovascular:     Rate and Rhythm: Normal rate and regular rhythm.     Pulses:  Normal pulses.  Pulmonary:     Effort: Pulmonary effort is normal.     Comments: Decreased breath sounds bilaterally, some cough with deep inspiration.  No rhonchi present Abdominal:     General: Abdomen is flat.     Palpations: Abdomen is soft.  Neurological:     Mental Status: She is alert and oriented to person, place, and time.     Labs reviewed: Recent Labs    02/22/23 0745 02/23/23 0213 02/25/23 0957 03/02/23 0000 03/19/23 0000  NA 135 138 138 134* 141  K 3.7 3.5 3.5 3.8 3.3*  CL 100 102 104 99 102  CO2 25 26 26  25* 25*  GLUCOSE 145* 111* 157*  --   --   BUN 26* 24* 23 46* 28*  CREATININE 1.04* 0.94 0.79 1.1 1.1  CALCIUM 8.7* 8.2* 8.2* 8.3* 8.8   Recent Labs    08/18/22 0000 02/22/23 0745  AST 28 20  ALT 21 15  ALKPHOS 89 91  BILITOT  --  1.1  PROT  --  7.9  ALBUMIN 3.7 3.8   Recent Labs    02/22/23 0745 02/23/23 0213 02/25/23 0957 03/02/23 0000 03/09/23 0000  WBC 15.0* 10.9* 8.2 6.6 5.2  NEUTROABS  --   --  6.0 4,290.00 2,974.00  HGB 14.7 13.1 13.9 13.3 12.3  HCT 47.0* 41.6 45.0 42 41  MCV 84.7 83.2 84.0  --   --   PLT 249 210 237 190 171   Lab Results  Component Value Date   TSH 2.925 11/18/2021   Lab Results  Component Value Date   HGBA1C 6.5 10/30/2022   Lab Results  Component Value Date   CHOL 146 07/17/2022   HDL 38 07/17/2022   LDLCALC 77 07/17/2022   TRIG 226 (A) 07/17/2022   CHOLHDL 7.7 11/27/2021    Significant Diagnostic Results in last 30 days:  No results found.  Assessment/Plan Chronic systolic CHF (congestive heart failure), NYHA class 3 (HCC)  Chronic atrial fibrillation (HCC)  CKD stage 3 due to type 2 diabetes mellitus (HCC)  Chronic respiratory failure with hypoxia (HCC)  Hepatic cirrhosis, unspecified hepatic cirrhosis type, unspecified whether ascites present (HCC)  Recurrent major depressive disorder, in partial remission (  HCC)  Type 2 diabetes mellitus without complication, without long-term current  use of insulin (HCC)  Primary hypertension  Pressure injury of coccygeal region, stage 2 [facility]) Patient with recent hospital admission for acute hypoxic respiratory failure in the setting of COVID-pneumonia.  Patient now on her baseline oxygen requirement of 4 L nasal cannula.  She has had improvement of symptoms, appears euvolemic on exam, rate well-controlled at this time.  No signs of bleeding at this time.  Continue anticoagulation with Eliquis 2.5 mg twice daily.  Continue Jardiance 10 mg daily for diabetes and heart failure.  Continue torsemide 10 mg daily.  Continue 50 mg of Zoloft for depression which is stable at this time.  Blood pressure stable at this time continue valsartan.  Osteoporosis without any current fracture, continue alendronate 70 mg weekly.  Continue physical therapy given debility in the setting of recent hospitalization.  Family/ staff Communication: Nursing  Labs/tests ordered: None

## 2023-04-02 ENCOUNTER — Encounter: Payer: Self-pay | Admitting: Student

## 2023-04-06 LAB — CBC AND DIFFERENTIAL
HCT: 46 (ref 36–46)
Hemoglobin: 13.9 (ref 12.0–16.0)
Neutrophils Absolute: 7302
Platelets: 210 10*3/uL (ref 150–400)
WBC: 10.1

## 2023-04-06 LAB — CBC: RBC: 5.46 — AB (ref 3.87–5.11)

## 2023-04-08 NOTE — Progress Notes (Unsigned)
PCP: Earnestine Mealing, MD (last seen 10/24) Primary Cardiologist: Marcina Millard, MD (last seen 08/24; returns 12/24)  HPI:   Jacqueline [name] is a 89 y/o female with a history of NSTEMI 11/2021, T2DM, paroxysmal atrial fibrillation, CVA 11/2021, hyperlipidemia, HTN, CKD, thyroid disease, anemia, GERD, hyperkalemia, hypothyroidism 03/2017, osteoporosis and chronic heart failure.  Admitted 11/18/21 due to acute on chronic hypoxic respiratory failure initially requiring biPAP due to actue on chronic HF. Initially given IV lasix with transition to oral diuretics. Cardiology consult obtained.  PT/OT evaluations done. Discharged after 7 days to SNF. Admitted 11/26/21 due to lethargy, slurred speech diaphoresis with left shoulder pain radiating to the arm and back pain.  MRI brain was obtained and showed acute right cerebellar infarct. Troponin elevated. Neurology, cardiology and palliative care consults obtained. Diagnosed with NSTEMI but heparin deferred due to stroke. Found to have new onset AF. 6/15: repeated head CT given recurrent episode on minimal responsiveness, worsened speech and diaphoresis, no hemorrhagic conversion. Discharged after 3 days.   Admitted 02/22/23 due to cough and SOB, O2 sats in the 70s. She was previously diagnosed with COVID on 8/22 and treated with Paxlovid for 5 days. She was diagnosed with post-viral bacterial infection and treated with Ceftriaxone and Azithromycin. Chest x-ray negative for pneumonia. 1 dose of IV lasix given. Trop elevation 49>53, pattern is flat, likely demanding ischemia from hypoxia. Discharged to rehab at [facility].    Echo 06/02/17: EF 30-35% with mild LVH, mild AR, mild/ moderate MR/TR, mild LAE Echo 01/05/19: EF 55-60% with mild LAE Echo 11/18/21: EF 20-25% with severe LVH, Grade II DD, mild LAE, mild MR Echo 11/27/21: EF 40-45% along with mild LVH and mild MR.  Echo 03/06/23: EF 50% (@ [facility])  She presents today with her daughter for a HF f/u visit  with a chief complaint of moderate SOB with little exertion. She says that she feels like her SOB is worsening where sometimes she feels a little short of breath at rest. Has associated fatigue, palpitations, productive cough, trouble sleeping (due to cough) and pedal edema along with this. Denies chest pain, abdominal distention or weight gain. Her daughter says that her oxygen has recently been turned up from 4L to 5L due to her SOB.   At last visit, torsemide was increased to 20mg  M, W, F & 40mg  the other days of the week. Wearing compression socks and edema seems to be a little bit better.   ROS: All systems negative except as listed in HPI, PMH and Problem List.  SH:  Social History   Socioeconomic History   Marital status: Widowed    Spouse name: Not on file   Number of children: Not on file   Years of education: Not on file   Highest education level: Not on file  Occupational History   Not on file  Tobacco Use   Smoking status: Former    Current packs/day: 0.00    Types: Cigarettes    Quit date: 04/18/1984    Years since quitting: 38.9   Smokeless tobacco: Never  Vaping Use   Vaping status: Former   Start date: 05/16/1965   Quit date: 05/16/1989  Substance and Sexual Activity   Alcohol use: No   Drug use: No   Sexual activity: Never  Other Topics Concern   Not on file  Social History Narrative   Not on file   Social Determinants of Health   Financial Resource Strain: Not on file  Food Insecurity: No  Food Insecurity (02/23/2023)   Hunger Vital Sign    Worried About Running Out of Food in the Last Year: Never true    Ran Out of Food in the Last Year: Never true  Transportation Needs: No Transportation Needs (02/23/2023)   PRAPARE - Administrator, Civil Service (Medical): No    Lack of Transportation (Non-Medical): No  Physical Activity: Not on file  Stress: Not on file  Social Connections: Not on file  Intimate Partner Violence: Not At Risk (02/23/2023)    Humiliation, Afraid, Rape, and Kick questionnaire    Fear of Current or Ex-Partner: No    Emotionally Abused: No    Physically Abused: No    Sexually Abused: No    FH:  Family History  Problem Relation Age of Onset   Premature CHD Brother     Past Medical History:  Diagnosis Date   Acute exacerbation of CHF (congestive heart failure) (HCC) 11/18/2021   AKI (acute kidney injury) (HCC) 11/26/2021   Anemia    CHF (congestive heart failure) (HCC)    Diabetes mellitus without complication (HCC) 03/13/2017   Currently no high BS.  Has come off meds.  But having episodes of low BS now.   Dilated idiopathic cardiomyopathy (HCC)    Dizziness 02/18/2017   GERD (gastroesophageal reflux disease)    Hyperkalemia 11/26/2021   Hyperlipidemia    Hypertension    Hyponatremia 06/09/2017   Hypothyroidism    Osteoporosis    Renal disorder    stage 3    Current Outpatient Medications  Medication Sig Dispense Refill   acetaminophen (TYLENOL) 325 MG tablet Take 2 tablets (650 mg total) by mouth every 4 (four) hours as needed for headache or mild pain.     alendronate (FOSAMAX) 70 MG tablet Take 70 mg by mouth once a week. Take with a full glass of water on an empty stomach.     apixaban (ELIQUIS) 2.5 MG TABS tablet Take 1 tablet (2.5 mg total) by mouth 2 (two) times daily. Start on 11/30/2021 60 tablet    ARTIFICIAL TEAR SOLUTION OP Apply 1 drop to eye 2 (two) times daily. Both Eyes.     atorvastatin (LIPITOR) 80 MG tablet Take 1 tablet (80 mg total) by mouth daily.     calcium elemental as carbonate (TUMS ULTRA 1000) 400 MG chewable tablet Chew 1,000 mg by mouth 3 (three) times daily as needed for heartburn.     Dextromethorphan-guaiFENesin 20-400 MG TABS Take 1 tablet by mouth 2 (two) times daily.     diclofenac Sodium (VOLTAREN) 1 % GEL Apply 2 g topically daily as needed (pain). Apply to back topically every day shift for pain.     ergocalciferol (VITAMIN D2) 50000 units capsule Take 50,000  Units by mouth once a week. Monday     ipratropium-albuterol (DUONEB) 0.5-2.5 (3) MG/3ML SOLN Take 3 mLs by nebulization 3 (three) times daily as needed. 360 mL 0   JARDIANCE 10 MG TABS tablet Take 10 mg by mouth daily.     levothyroxine (SYNTHROID, LEVOTHROID) 25 MCG tablet Take 25 mcg by mouth daily before breakfast.     magnesium hydroxide (MILK OF MAGNESIA) 400 MG/5ML suspension Take 30 mLs by mouth daily as needed for mild constipation.     OXYGEN 4lpm via [place] for SOB continuous.     pantoprazole (PROTONIX) 40 MG tablet Take 40 mg by mouth in the morning and at bedtime.     polyethylene glycol (MIRALAX / GLYCOLAX)  17 g packet Take 17 g by mouth every other day.     senna-docusate (SENOKOT-S) 8.6-50 MG tablet Take 2 tablets by mouth daily.     sertraline (ZOLOFT) 50 MG tablet Take 50 mg by mouth daily.     torsemide (DEMADEX) 10 MG tablet Take 20 mg by mouth daily. Tue,Thu,Sat, Sun     valsartan (DIOVAN) 80 MG tablet Take 80 mg by mouth daily.     No current facility-administered medications for this visit.   Vitals:   04/09/23 1353 04/09/23 1404  BP: (!) 115/57   Pulse: 77 79  SpO2: 92% 97%   Wt Readings from Last 3 Encounters:  04/01/23 118 lb (53.5 kg)  03/06/23 120 lb 9.6 oz (54.7 kg)  02/27/23 117 lb 12.8 oz (53.4 kg)   Lab Results  Component Value Date   CREATININE 1.1 03/19/2023   CREATININE 1.1 03/02/2023   CREATININE 0.79 02/25/2023    PHYSICAL EXAM:  General:  Well appearing. No resp difficulty HEENT: normal Neck: supple. JVP flat. No lymphadenopathy or thryomegaly appreciated. Cor: PMI normal. Regular rhythm, rate. No rubs, gallops or murmurs. Lungs: clear Abdomen: soft, nontender, nondistended. No hepatosplenomegaly. No bruits or masses. Extremities: no cyanosis, clubbing, rash, 1+ pitting edema bilateral lower legs although softer Neuro: alert & oriented x3, cranial nerves grossly intact. Moves all 4 extremities w/o difficulty. Affect pleasant.   ECG: not  done  ReDs: 32%   ASSESSMENT & PLAN:  1: NICM with preserved ejection fraction- - suspect due to atrial fibrillation - NYHA class III - euvolemic today - getting weighed daily at facility; reminded to call for an overnight weight gain of > 2 pounds or a weekly weight gain of > 5 pounds - ReDs 32% - Echo 06/02/17: EF 30-35% with mild LVH, mild AR, mild/ moderate MR/TR, mild LAE - Echo 01/05/19: EF 55-60% with mild LAE - Echo 11/18/21: EF 20-25% with severe LVH, Grade II DD, mild LAE, mild MR - Echo 11/27/21: EF 40-45% along with mild LVH and mild MR.  - continue jardiance 10mg  daily - continue torsemide 20mg  M, W, F & 40mg  the other days of the week - continue valsartan 160mg  daily - has allergy to spironolactone & entresto (hypotension) - has been hypotensive in the past so may not be able to tolerate beta-blocker - had video visit with palliative care on 04/23 - wearing oxygen 5L around the clock; this has recently been increased from 4L due to her SOB - pulmonology referral placed today for her increasing oxygen requirement - not adding any salt to her food - BNP 02/22/23 was 746.2  2: HTN- - BP 115/57 - saw PCP (Beamer) 10/24 - BMP 03/19/23 reviewed and showed sodium 141, potassium 3.3, creatinine 1.1 and GFR 49 - begin potassium daily  3: DM- - A1c 10/30/22 was 6.5%  4: PAF- - saw cardiology (Paraschos) 08/24 - continue apixaban 2.5mg  BID  5: Hyperlipidemia- - continue atorvastatin 80mg  daily - LDL 07/17/22 was 77  Return in 2 months, sooner if needed.

## 2023-04-09 ENCOUNTER — Ambulatory Visit: Payer: Medicare Other | Attending: Family | Admitting: Family

## 2023-04-09 ENCOUNTER — Telehealth: Payer: Self-pay

## 2023-04-09 ENCOUNTER — Encounter: Payer: Self-pay | Admitting: Family

## 2023-04-09 VITALS — BP 115/57 | HR 79

## 2023-04-09 DIAGNOSIS — E039 Hypothyroidism, unspecified: Secondary | ICD-10-CM | POA: Diagnosis not present

## 2023-04-09 DIAGNOSIS — E785 Hyperlipidemia, unspecified: Secondary | ICD-10-CM | POA: Diagnosis not present

## 2023-04-09 DIAGNOSIS — I10 Essential (primary) hypertension: Secondary | ICD-10-CM

## 2023-04-09 DIAGNOSIS — Z8673 Personal history of transient ischemic attack (TIA), and cerebral infarction without residual deficits: Secondary | ICD-10-CM | POA: Insufficient documentation

## 2023-04-09 DIAGNOSIS — I130 Hypertensive heart and chronic kidney disease with heart failure and stage 1 through stage 4 chronic kidney disease, or unspecified chronic kidney disease: Secondary | ICD-10-CM | POA: Insufficient documentation

## 2023-04-09 DIAGNOSIS — I252 Old myocardial infarction: Secondary | ICD-10-CM | POA: Diagnosis present

## 2023-04-09 DIAGNOSIS — I480 Paroxysmal atrial fibrillation: Secondary | ICD-10-CM | POA: Diagnosis present

## 2023-04-09 DIAGNOSIS — D631 Anemia in chronic kidney disease: Secondary | ICD-10-CM | POA: Diagnosis not present

## 2023-04-09 DIAGNOSIS — K219 Gastro-esophageal reflux disease without esophagitis: Secondary | ICD-10-CM | POA: Insufficient documentation

## 2023-04-09 DIAGNOSIS — E1122 Type 2 diabetes mellitus with diabetic chronic kidney disease: Secondary | ICD-10-CM | POA: Diagnosis not present

## 2023-04-09 DIAGNOSIS — I428 Other cardiomyopathies: Secondary | ICD-10-CM | POA: Diagnosis not present

## 2023-04-09 DIAGNOSIS — N183 Chronic kidney disease, stage 3 unspecified: Secondary | ICD-10-CM | POA: Insufficient documentation

## 2023-04-09 DIAGNOSIS — E119 Type 2 diabetes mellitus without complications: Secondary | ICD-10-CM | POA: Diagnosis present

## 2023-04-09 DIAGNOSIS — M810 Age-related osteoporosis without current pathological fracture: Secondary | ICD-10-CM | POA: Insufficient documentation

## 2023-04-09 DIAGNOSIS — I5032 Chronic diastolic (congestive) heart failure: Secondary | ICD-10-CM | POA: Insufficient documentation

## 2023-04-09 DIAGNOSIS — E875 Hyperkalemia: Secondary | ICD-10-CM | POA: Insufficient documentation

## 2023-04-09 LAB — BASIC METABOLIC PANEL
BUN: 36 — AB (ref 4–21)
CO2: 28 — AB (ref 13–22)
Chloride: 103 (ref 99–108)
Creatinine: 1.2 — AB (ref 0.5–1.1)
Glucose: 77
Potassium: 3.8 meq/L (ref 3.5–5.1)
Sodium: 141 (ref 137–147)

## 2023-04-09 LAB — COMPREHENSIVE METABOLIC PANEL
Calcium: 8.4 — AB (ref 8.7–10.7)
eGFR: 44

## 2023-04-09 NOTE — Patient Instructions (Addendum)
Go DOWN to LOWER LEVEL (LL) to have your blood work completed inside of [employer] office.  You have been referred to Pulmonology. You will receive a call to schedule this appointment.

## 2023-04-09 NOTE — Progress Notes (Signed)
   04/09/23 1430  ReDS Vest / Clip  Station Marker A  Ruler Value 27  ReDS Value Range < 36  ReDS Actual Value 32

## 2023-04-10 ENCOUNTER — Encounter: Payer: Self-pay | Admitting: Student

## 2023-04-10 NOTE — Telephone Encounter (Signed)
Facility phone sent to VM twice then phone hung up while attempting to lvm. Trying to confirm pts appt today and get most recent labs faxed over [phone].

## 2023-05-06 ENCOUNTER — Ambulatory Visit
Admission: RE | Admit: 2023-05-06 | Discharge: 2023-05-06 | Disposition: A | Discharge status: Home / Self Care | Payer: Medicare Other | Source: Ambulatory Visit | Attending: Pulmonary Disease | Admitting: Pulmonary Disease

## 2023-05-06 ENCOUNTER — Encounter: Payer: Self-pay | Admitting: Pulmonary Disease

## 2023-05-06 ENCOUNTER — Ambulatory Visit: Payer: Medicare Other | Admitting: Pulmonary Disease

## 2023-05-06 VITALS — BP 98/54 | HR 82 | Temp 97.6°F | Ht <= 58 in

## 2023-05-06 DIAGNOSIS — R0602 Shortness of breath: Secondary | ICD-10-CM | POA: Insufficient documentation

## 2023-05-06 MED ORDER — BUDESONIDE 0.25 MG/2ML IN SUSP
0.2500 mg | Freq: Two times a day (BID) | RESPIRATORY_TRACT | 12 refills | Status: DC
Start: 2023-05-06 — End: 2023-08-18

## 2023-05-06 MED ORDER — ARFORMOTEROL TARTRATE 15 MCG/2ML IN NEBU: 15.0000 ug | INHALATION_SOLUTION | Freq: Two times a day (BID) | RESPIRATORY_TRACT | 6 refills | Status: DC

## 2023-05-06 NOTE — Progress Notes (Signed)
Synopsis: Referred in by Delma Freeze, FNP   Subjective:   PATIENT ID: Jacqueline [name] GENDER: female DOB: 1932/09/19, MRN: [id number]  Chief Complaint  Patient presents with   Consult    Here with daughter Vincent Gros.     HPI 89 year old female patient with a past medical history of nonischemic cardiomyopathy EF 40 to 45% in 2023 NYHA class III, type 2 diabetes mellitus, paroxysmal A-fib on Eliquis, CVA in 2023 complicated by left-sided hemiparesis and tremors, hypothyroidism presenting today to the pulmonary clinic for evaluation of ongoing shortness of breath despite optimization of volume status.  Her daughter was with her during visit today reports that since she had COVID about 6 to 9 months ago her breathing became worse with increased oxygen requirement from 2 L nasal cannula up to 5 L nasal cannula.  She is mostly sedentary now and walks barely few feet before she needs to stop to catch her breath.  Before that she was walking with a walker.  She is [facility] a resident at [facility].  Chest x-ray in 2024 with no radiographic evidence of acute cardiopulmonary disease.  H&H stable.  No peripheral eosinophilia  Social history: Remote history of smoking.   ROS All systems were reviewed and are negative except for the above. Objective:   Vitals:   05/06/23 1529  BP: (!) 98/54  Pulse: 82  Temp: 97.6 F (36.4 C)  TempSrc: Temporal  SpO2: 99%  Height: 4\' 9"  (1.448 m)   99% on \\RA  BMI Readings from Last 3 Encounters:  05/06/23 25.53 kg/m  04/01/23 25.53 kg/m  03/09/23 26.10 kg/m   Wt Readings from Last 3 Encounters:  04/01/23 118 lb (53.5 kg)  03/06/23 120 lb 9.6 oz (54.7 kg)  02/27/23 117 lb 12.8 oz (53.4 kg)    Physical Exam GEN: NAD, in wheelchair. HEENT: Supple Neck, Reactive Pupils, EOMI  CVS: Normal S1, Normal S2, irregularly irregular rhythm, no murmurs appreciated. Lungs: Clear bilateral air entry.  Abdomen: Soft, non tender, non distended, + BS   Extremities: Left lower extremity edema +1 to +2.  Labs and imaging were reviewed.  Ancillary Information   CBC    Component Value Date/Time   WBC 10.1 04/06/2023 0000   WBC 8.2 02/25/2023 0957   RBC 5.46 (A) 04/06/2023 0000   HGB 13.9 04/06/2023 0000   HGB 13.4 06/15/2014 0346   HCT 46 04/06/2023 0000   HCT 40.1 06/15/2014 0346   PLT 210 04/06/2023 0000   PLT 206 06/15/2014 0346   MCV 84.0 02/25/2023 0957   MCV 94 06/15/2014 0346   MCH 25.9 (L) 02/25/2023 0957   MCHC 30.9 02/25/2023 0957   RDW 16.2 (H) 02/25/2023 0957   RDW 13.3 06/15/2014 0346   LYMPHSABS 1.7 02/25/2023 0957   LYMPHSABS 1.4 06/15/2014 0346   MONOABS 0.5 02/25/2023 0957   MONOABS 0.6 06/15/2014 0346   EOSABS 0.0 02/25/2023 0957   EOSABS 0.2 06/15/2014 0346   BASOSABS 0.0 02/25/2023 0957   BASOSABS 0.0 06/15/2014 0346        No data to display           Assessment & Plan:  89 year old female patient with a past medical history of nonischemic cardiomyopathy EF 40 to 45% in 2023 NYHA class III, type 2 diabetes mellitus, paroxysmal A-fib on Eliquis, CVA in 2023 complicated by left-sided hemiparesis and tremors, hypothyroidism presenting today to the pulmonary clinic for evaluation of ongoing shortness of breath despite optimization of volume status.  #  Dyspnea  Multifactorial secondary to NICM and possible reactive airway disease post COVID-19.   []  Start budesonide Neb 0.25 bid scheduled  []  Start Arformoterol Neb BID scheduled  []  C/w Duonebs Q6H PRN as needed.   #Left lower Extremity edema  []  US duplex today   #NICM EF 40-45%  Follows with [facility]  Euvolemic on exam today.  On Valsartan 160mg  , jardiance 10mg , torsemide 20mg .   #PAF  On eliquis 2.5mg  BID.    Return in about 3 months (around 08/06/2023).  I spent 60 minutes caring for this patient today, including preparing to see the patient, obtaining a medical history , reviewing a separately obtained history,  performing a medically appropriate examination and/or evaluation, counseling and educating the patient/family/caregiver, ordering medications, tests, or procedures, documenting clinical information in the electronic health record, and independently interpreting results (not separately reported/billed) and communicating results to the patient/family/caregiver  Janann Colonel, MD [facility] 05/06/2023 4:16 PM

## 2023-05-07 LAB — CBC: RBC: 5.89 — AB (ref 3.87–5.11)

## 2023-05-07 LAB — CBC AND DIFFERENTIAL
HCT: 48 — AB (ref 36–46)
Hemoglobin: 14.7 (ref 12.0–16.0)
Neutrophils Absolute: 6295
Platelets: 187 10*3/uL (ref 150–400)
WBC: 9.7

## 2023-05-12 ENCOUNTER — Encounter: Payer: Self-pay | Admitting: Nurse Practitioner

## 2023-05-12 ENCOUNTER — Non-Acute Institutional Stay (SKILLED_NURSING_FACILITY): Payer: Self-pay | Admitting: Nurse Practitioner

## 2023-05-12 DIAGNOSIS — N939 Abnormal uterine and vaginal bleeding, unspecified: Secondary | ICD-10-CM | POA: Diagnosis not present

## 2023-05-12 DIAGNOSIS — Z66 Do not resuscitate: Secondary | ICD-10-CM

## 2023-05-12 DIAGNOSIS — I482 Chronic atrial fibrillation, unspecified: Secondary | ICD-10-CM | POA: Diagnosis not present

## 2023-05-12 DIAGNOSIS — I10 Essential (primary) hypertension: Secondary | ICD-10-CM

## 2023-05-12 DIAGNOSIS — J9611 Chronic respiratory failure with hypoxia: Secondary | ICD-10-CM

## 2023-05-12 DIAGNOSIS — E119 Type 2 diabetes mellitus without complications: Secondary | ICD-10-CM | POA: Diagnosis not present

## 2023-05-12 DIAGNOSIS — I5022 Chronic systolic (congestive) heart failure: Secondary | ICD-10-CM

## 2023-05-12 NOTE — Progress Notes (Signed)
Location:  Other [facility] Home Room Number: 416 A Place of Service:  SNF (31)  Earnesteen Birnie K. Janyth Contes, NP   Patient Care Team: Earnestine Mealing, MD as PCP - General (Family Medicine) Benita Gutter, RN as Registered Nurse  Extended Emergency Contact Information Primary Emergency Contact: [name] Address: [address]          [place], [place] [address] [name] of [place] Home Phone: [phone] Mobile Phone: [phone] Relation: Daughter Secondary Emergency Contact: [name] [place] Home Phone: [phone] Relation: Son  Goals of care: Advanced Directive information    05/12/2023   11:23 AM  Advanced Directives  Does Patient Have a Medical Advance Directive? Yes  Type of Advance Directive Out of facility DNR (pink MOST or yellow form)  Does patient want to make changes to medical advance directive? No - Patient declined  Pre-existing out of facility DNR order (yellow form or pink MOST form) Yellow form placed in chart (order not valid for inpatient use)     Chief Complaint  Patient presents with   Medical Management of Chronic Issues    Routine follow-up. Discuss need for covid booster and A1c     HPI:  Pt is a 89 y.o. female seen today for medical management of chronic disease.   Recently went to heart failure clinic and pulmonary.  New nebs added and daughter reports improvement in breathing. Does not seem as short of breath. Pt reports she feels like they are working well.  She continues at 4L [place] and has not heard she has needed any more supplemental o2  Daughter noted ongoing intermittent vaginal bleeding, happened a year ago and has been on and off since- maybe 3 or 4 times since.   Good appetite.  Eating well at meals   Past Medical History:  Diagnosis Date   Acute exacerbation of CHF (congestive heart failure) (HCC) 11/18/2021   AKI (acute kidney injury) (HCC) 11/26/2021   Anemia    CHF (congestive heart failure)  (HCC)    Diabetes mellitus without complication (HCC) 03/13/2017   Currently no high BS.  Has come off meds.  But having episodes of low BS now.   Dilated idiopathic cardiomyopathy (HCC)    Dizziness 02/18/2017   GERD (gastroesophageal reflux disease)    Hyperkalemia 11/26/2021   Hyperlipidemia    Hypertension    Hyponatremia 06/09/2017   Hypothyroidism    Osteoporosis    Renal disorder    stage 3   Past Surgical History:  Procedure Laterality Date   BUNIONECTOMY     COLONOSCOPY     ESOPHAGOGASTRODUODENOSCOPY (EGD) WITH PROPOFOL N/A 10/15/2017   Procedure: ESOPHAGOGASTRODUODENOSCOPY (EGD) WITH PROPOFOL;  Surgeon: Christena Deem, MD;  Location: [facility] ENDOSCOPY;  Service: Endoscopy;  Laterality: N/A;   KYPHOPLASTY N/A 06/12/2017   Procedure: [id number];  Surgeon: Kennedy Bucker, MD;  Location: [facility] ORS;  Service: Orthopedics;  Laterality: N/A;   TONSILLECTOMY      Allergies  Allergen Reactions   Ciprofloxacin    Digoxin And Related    Entresto [Sacubitril-Valsartan] Other (See Comments)    Hypotensive    Erythromycin    Lopid [Gemfibrozil]    Nitrofurantoin    Spironolactone Other (See Comments)    Hospitalized for dehydration   Cefuroxime Rash   Penicillins Rash    Has patient had a PCN reaction causing immediate rash, facial/tongue/throat swelling, SOB or lightheadedness with hypotension: Unknown Has patient had a PCN reaction causing severe rash involving mucus membranes or  skin necrosis: No Has patient had a PCN reaction that required hospitalization: No Has patient had a PCN reaction occurring within the last 10 years: No If all of the above answers are "NO", then may proceed with Cephalosporin use.     Outpatient Encounter Medications as of 05/12/2023  Medication Sig   acetaminophen (TYLENOL) 325 MG tablet Take 2 tablets (650 mg total) by mouth every 4 (four) hours as needed for headache or mild pain.   alendronate (FOSAMAX) 70 MG tablet Take 70 mg by mouth  once a week. Take with a full glass of water on an empty stomach.   apixaban (ELIQUIS) 2.5 MG TABS tablet Take 1 tablet (2.5 mg total) by mouth 2 (two) times daily. Start on 11/30/2021   arformoterol (BROVANA) 15 MCG/2ML NEBU Take 2 mLs (15 mcg total) by nebulization in the morning and at bedtime.   atorvastatin (LIPITOR) 80 MG tablet Take 1 tablet (80 mg total) by mouth daily.   budesonide (PULMICORT) 0.25 MG/2ML nebulizer solution Take 2 mLs (0.25 mg total) by nebulization in the morning and at bedtime.   calcium elemental as carbonate (TUMS ULTRA 1000) 400 MG chewable tablet Chew 1,000 mg by mouth 3 (three) times daily as needed for heartburn.   Cholecalciferol (D3-1000) 25 MCG (1000 UT) capsule Take 1,000 Units by mouth daily.   Dextromethorphan-guaiFENesin 20-400 MG TABS Take 1 tablet by mouth 2 (two) times daily.   diclofenac Sodium (VOLTAREN) 1 % GEL Apply 2 g topically daily as needed (pain). Apply to back topically every day shift for pain.   ipratropium-albuterol (DUONEB) 0.5-2.5 (3) MG/3ML SOLN Take 3 mLs by nebulization 3 (three) times daily as needed.   JARDIANCE 10 MG TABS tablet Take 10 mg by mouth daily.   levothyroxine (SYNTHROID, LEVOTHROID) 25 MCG tablet Take 25 mcg by mouth daily before breakfast.   magnesium hydroxide (MILK OF MAGNESIA) 400 MG/5ML suspension Take 30 mLs by mouth daily as needed for mild constipation.   Nutritional Supplements (,FEEDING SUPPLEMENT, PROSOURCE PLUS) liquid Take 30 mLs by mouth daily.   Nutritional Supplements (ENSURE ENLIVE PO) Take 1 Can by mouth as directed. After meals if she eats less than 50 % due to poor appetite and weight loss s/p covid   OXYGEN 4lpm via [place] for SOB continuous.   pantoprazole (PROTONIX) 40 MG tablet Take 40 mg by mouth in the morning and at bedtime.   polyethylene glycol (MIRALAX / GLYCOLAX) 17 g packet Take 17 g by mouth every other day.   potassium chloride (KLOR-CON) 20 MEQ packet Take 20 mEq by mouth daily.    senna-docusate (SENOKOT-S) 8.6-50 MG tablet Take 2 tablets by mouth daily.   sertraline (ZOLOFT) 50 MG tablet Take 50 mg by mouth daily.   torsemide (DEMADEX) 10 MG tablet Take 20 mg by mouth daily.   valsartan (DIOVAN) 80 MG tablet Take 80 mg by mouth daily.   ARTIFICIAL TEAR SOLUTION OP Apply 1 drop to eye 2 (two) times daily. Both Eyes. (Patient not taking: Reported on 05/12/2023)   ergocalciferol (VITAMIN D2) 50000 units capsule Take 50,000 Units by mouth once a week. Monday (Patient not taking: Reported on 05/12/2023)   No facility-administered encounter medications on file as of 05/12/2023.    Review of Systems  Constitutional:  Negative for activity change, appetite change, fatigue and unexpected weight change.  HENT:  Negative for congestion and hearing loss.   Eyes: Negative.   Respiratory:  Negative for cough and shortness of breath.  Cardiovascular:  Negative for chest pain, palpitations and leg swelling.  Gastrointestinal:  Negative for abdominal pain, constipation and diarrhea.  Genitourinary:  Negative for difficulty urinating and dysuria.  Musculoskeletal:  Negative for arthralgias and myalgias.  Skin:  Negative for color change and wound.  Neurological:  Negative for dizziness and weakness.  Psychiatric/Behavioral:  Negative for agitation, behavioral problems and confusion.      Immunization History  Administered Date(s) Administered   Influenza Inj Mdck Quad Pf 03/10/2019, 03/18/2021   Influenza Split 04/18/2014, 05/01/2015   Influenza, High Dose Seasonal PF 04/01/2022   Influenza,inj,Quad PF,6+ Mos 03/12/2020   Influenza-Unspecified 03/24/2012, 03/24/2013, 04/18/2014, 05/01/2015, 04/08/2023   PFIZER Comirnaty(Gray Top)Covid-19 Tri-Sucrose Vaccine 07/21/2019, 08/12/2019   PNEUMOCOCCAL CONJUGATE-20 04/29/2022   Pfizer Covid-19 Vaccine Bivalent Booster 93yrs & up 11/22/2021   Pneumococcal Polysaccharide-23 08/20/2015, 04/29/2022   Unspecified SARS-COV-2 Vaccination  07/21/2019, 08/11/2019   Zoster Recombinant(Shingrix) 04/29/2022   Pertinent  Health Maintenance Due  Topic Date Due   HEMOGLOBIN A1C  05/02/2023   OPHTHALMOLOGY EXAM  08/25/2023   FOOT EXAM  12/09/2023   INFLUENZA VACCINE  Completed   DEXA SCAN  Completed      11/28/2021    7:57 PM 11/29/2021    7:59 AM 12/18/2021   12:34 PM 08/14/2022   11:35 AM 10/23/2022    2:27 PM  Fall Risk  Falls in the past year?   0 0 0  Was there an injury with Fall?   0 0   Fall Risk Category Calculator   0 0   Fall Risk Category (Retired)   Low    (RETIRED) Patient Fall Risk Level High fall risk High fall risk Moderate fall risk    Patient at Risk for Falls Due to   Impaired mobility;Impaired balance/gait No Fall Risks   Fall risk Follow up   Falls evaluation completed;Education provided;Falls prevention discussed Falls evaluation completed    Functional Status Survey:    Vitals:   05/12/23 1025  BP: (!) 106/58  Pulse: 86  Temp: (!) 97.3 F (36.3 C)  SpO2: 92%  Weight: 109 lb (49.4 kg)  Height: 4\' 9"  (1.448 m)   Body mass index is 23.59 kg/m. Physical Exam Constitutional:      General: She is not in acute distress.    Appearance: She is well-developed. She is not diaphoretic.  HENT:     Head: Normocephalic and atraumatic.     Mouth/Throat:     Pharynx: No oropharyngeal exudate.  Eyes:     Conjunctiva/sclera: Conjunctivae normal.     Pupils: Pupils are equal, round, and reactive to light.  Cardiovascular:     Rate and Rhythm: Normal rate and regular rhythm.     Heart sounds: Normal heart sounds.  Pulmonary:     Effort: Pulmonary effort is normal.     Breath sounds: Normal breath sounds.  Abdominal:     General: Bowel sounds are normal.     Palpations: Abdomen is soft.  Musculoskeletal:        General: No swelling.     Cervical back: Normal range of motion and neck supple.     Right lower leg: No edema.     Left lower leg: No edema.  Skin:    General: Skin is warm and dry.   Neurological:     Mental Status: She is alert.  Psychiatric:        Mood and Affect: Mood normal.     Labs reviewed: Recent Labs  02/22/23 0745 02/23/23 0213 02/25/23 0957 03/02/23 0000 03/19/23 0000 04/09/23 0000  NA 135 138 138 134* 141 141  K 3.7 3.5 3.5 3.8 3.3* 3.8  CL 100 102 104 99 102 103  CO2 25 26 26  25* 25* 28*  GLUCOSE 145* 111* 157*  --   --   --   BUN 26* 24* 23 46* 28* 36*  CREATININE 1.04* 0.94 0.79 1.1 1.1 1.2*  CALCIUM 8.7* 8.2* 8.2* 8.3* 8.8 8.4*   Recent Labs    08/18/22 0000 02/22/23 0745  AST 28 20  ALT 21 15  ALKPHOS 89 91  BILITOT  --  1.1  PROT  --  7.9  ALBUMIN 3.7 3.8   Recent Labs    02/22/23 0745 02/23/23 0213 02/25/23 0957 03/02/23 0000 03/09/23 0000 04/06/23 0000 05/07/23 0000  WBC 15.0* 10.9* 8.2   < > 5.2 10.1 9.7  NEUTROABS  --   --  6.0   < > 2,974.00 7,302.00 6,295.00  HGB 14.7 13.1 13.9   < > 12.3 13.9 14.7  HCT 47.0* 41.6 45.0   < > 41 46 48*  MCV 84.7 83.2 84.0  --   --   --   --   PLT 249 210 237   < > 171 210 187   < > = values in this interval not displayed.   Lab Results  Component Value Date   TSH 2.925 11/18/2021   Lab Results  Component Value Date   HGBA1C 6.5 10/30/2022   Lab Results  Component Value Date   CHOL 146 07/17/2022   HDL 38 07/17/2022   LDLCALC 77 07/17/2022   TRIG 226 (A) 07/17/2022   CHOLHDL 7.7 11/27/2021    Significant Diagnostic Results in last 30 days:  US Venous Img Lower Bilateral (DVT)  Result Date: 05/06/2023 CLINICAL DATA:  Left lower extremity pain and edema. Evaluate for DVT. EXAM: BILATERAL LOWER EXTREMITY VENOUS DOPPLER ULTRASOUND TECHNIQUE: Gray-scale sonography with graded compression, as well as color Doppler and duplex ultrasound were performed to evaluate the lower extremity deep venous systems from the level of the common femoral vein and including the common femoral, femoral, profunda femoral, popliteal and calf veins including the posterior tibial, peroneal  and gastrocnemius veins when visible. The superficial great saphenous vein was also interrogated. Spectral Doppler was utilized to evaluate flow at rest and with distal augmentation maneuvers in the common femoral, femoral and popliteal veins. COMPARISON:  None Available. FINDINGS: RIGHT LOWER EXTREMITY Common Femoral Vein: No evidence of thrombus. Normal compressibility, respiratory phasicity and response to augmentation. Saphenofemoral Junction: No evidence of thrombus. Normal compressibility and flow on color Doppler imaging. Profunda Femoral Vein: No evidence of thrombus. Normal compressibility and flow on color Doppler imaging. Femoral Vein: No evidence of thrombus. Normal compressibility, respiratory phasicity and response to augmentation. Popliteal Vein: No evidence of thrombus. Normal compressibility, respiratory phasicity and response to augmentation. Calf Veins: No evidence of thrombus. Normal compressibility and flow on color Doppler imaging. Superficial Great Saphenous Vein: No evidence of thrombus. Normal compressibility. Venous Reflux:  None. Other Findings:  None. LEFT LOWER EXTREMITY Common Femoral Vein: No evidence of thrombus. Normal compressibility, respiratory phasicity and response to augmentation. Saphenofemoral Junction: No evidence of thrombus. Normal compressibility and flow on color Doppler imaging. Profunda Femoral Vein: No evidence of thrombus. Normal compressibility and flow on color Doppler imaging. Femoral Vein: No evidence of thrombus. Normal compressibility, respiratory phasicity and response to augmentation. Popliteal Vein: No evidence of thrombus. Normal compressibility,  respiratory phasicity and response to augmentation. Calf Veins: No evidence of thrombus. Normal compressibility and flow on color Doppler imaging. Superficial Great Saphenous Vein: No evidence of thrombus. Normal compressibility. Venous Reflux:  None. Other Findings: There is a minimal amount of subcutaneous edema  at the level of the left calf (image 27). IMPRESSION: No evidence of deep venous thrombosis within either lower extremity. Electronically Signed   By: Simonne Come M.D.   On: 05/06/2023 17:19    Assessment/Plan 1. Type 2 diabetes mellitus without complication, without long-term current use of insulin (HCC) -Encouraged dietary compliance, routine foot care/monitoring and to keep up with diabetic eye exams through ophthalmology  Will follow up a1c  2. Vaginal bleeding -off and on for over a year Hgb is stable.  Due to comorbidies and age, will not do further workup   3. Chronic atrial fibrillation (HCC) Rate controlled, on eliquis for anticoagulation  4. DNR (do not resuscitate) - Do not attempt resuscitation (DNR)  5. Primary hypertension -Blood pressure well controlled, goal bp <140/90 Continue current medications and dietary modifications follow metabolic panel  6. Chronic systolic CHF (congestive heart failure), NYHA class 3 (HCC) Stable, euvolemic at this time. Continues on demadex daily   7. Chronic respiratory failure with hypoxia (HCC) -recently seen by pulmonary and started on budesonide and arformoterol with good results.  Continues on O2 at [employer]. Biagio Borg [facility] [phone]

## 2023-05-18 LAB — BASIC METABOLIC PANEL
BUN: 46 — AB (ref 4–21)
CO2: 27 — AB (ref 13–22)
Chloride: 103 (ref 99–108)
Creatinine: 1.2 — AB (ref 0.5–1.1)
Glucose: 107
Potassium: 4 meq/L (ref 3.5–5.1)
Sodium: 137 (ref 137–147)

## 2023-05-18 LAB — HEMOGLOBIN A1C: Hemoglobin A1C: 6.4

## 2023-05-18 LAB — COMPREHENSIVE METABOLIC PANEL
Calcium: 8.6 — AB (ref 8.7–10.7)
eGFR: 42

## 2023-05-20 ENCOUNTER — Encounter: Payer: Self-pay | Admitting: Student

## 2023-06-04 LAB — CBC AND DIFFERENTIAL
HCT: 40 (ref 36–46)
Hemoglobin: 12.2 (ref 12.0–16.0)
Neutrophils Absolute: 3822
Platelets: 172 10*3/uL (ref 150–400)
WBC: 5.8

## 2023-06-04 LAB — CBC: RBC: 4.83 (ref 3.87–5.11)

## 2023-06-08 LAB — CBC AND DIFFERENTIAL
HCT: 40 (ref 36–46)
Hemoglobin: 12.2 (ref 12.0–16.0)
Platelets: 155 10*3/uL (ref 150–400)
WBC: 5.2

## 2023-06-08 LAB — CBC: RBC: 4.73 (ref 3.87–5.11)

## 2023-06-12 NOTE — Progress Notes (Unsigned)
PCP: Earnestine Mealing, MD (last seen 10/24) Primary Cardiologist: Marcina Millard, MD (last seen 12/24)  Chief Complaint: shortness of breath   HPI:   Jacqueline [name] is a 89 y/o female with a history of NSTEMI 11/2021, T2DM, paroxysmal atrial fibrillation, CVA 11/2021, hyperlipidemia, HTN, CKD, thyroid disease, anemia, GERD, hyperkalemia, hypothyroidism 03/2017, osteoporosis and chronic heart failure.  Admitted 11/18/21 due to acute on chronic hypoxic respiratory failure initially requiring biPAP due to actue on chronic HF. Initially given IV lasix with transition to oral diuretics. Cardiology consult obtained.  PT/OT evaluations done. Discharged after 7 days to SNF. Admitted 11/26/21 due to lethargy, slurred speech diaphoresis with left shoulder pain radiating to the arm and back pain.  MRI brain was obtained and showed acute right cerebellar infarct. Troponin elevated. Neurology, cardiology and palliative care consults obtained. Diagnosed with NSTEMI but heparin deferred due to stroke. Found to have new onset AF. 6/15: repeated head CT given recurrent episode on minimal responsiveness, worsened speech and diaphoresis, no hemorrhagic conversion. Discharged after 3 days.   Admitted 02/22/23 due to cough and SOB, O2 sats in the 70s. She was previously diagnosed with COVID on 8/22 and treated with Paxlovid for 5 days. She was diagnosed with post-viral bacterial infection and treated with Ceftriaxone and Azithromycin. Chest x-ray negative for pneumonia. 1 dose of IV lasix given. Trop elevation 49>53, pattern is flat, likely demanding ischemia from hypoxia. Discharged to rehab at [facility].    Echo 06/02/17: EF 30-35% with mild LVH, mild AR, mild/ moderate MR/TR, mild LAE Echo 01/05/19: EF 55-60% with mild LAE Echo 11/18/21: EF 20-25% with severe LVH, Grade II DD, mild LAE, mild MR Echo 11/27/21: EF 40-45% along with mild LVH and mild MR.  Echo 03/06/23: EF 50% (@ [facility])  She presents today with her  daughter for a HF f/u visit with a chief complaint of moderate shortness of breath with little exertion. She does feel like her breathing has improved since she started using the nebulizer treatments. Her oxygen requirement is now down to 4L with starting nebulizer treatment. Has associated fatigue, dizziness, back pain, pedal edema and headaches along with this. Denies chest pain, cough, palpitations or abdominal distention. Wearing compression socks daily.   Wearing oxygen at 4L around the clock (was previously wearing 5L)  Potassium started at last visit.   ROS: All systems negative except as listed in HPI, PMH and Problem List.  SH:  Social History   Socioeconomic History   Marital status: Widowed    Spouse name: Not on file   Number of children: Not on file   Years of education: Not on file   Highest education level: Not on file  Occupational History   Not on file  Tobacco Use   Smoking status: Former    Current packs/day: 0.00    Types: Cigarettes    Quit date: 04/18/1984    Years since quitting: 39.1   Smokeless tobacco: Never  Vaping Use   Vaping status: Former   Start date: 05/16/1965   Quit date: 05/16/1989  Substance and Sexual Activity   Alcohol use: No   Drug use: No   Sexual activity: Never  Other Topics Concern   Not on file  Social History Narrative   Not on file   Social Drivers of Health   Financial Resource Strain: Not on file  Food Insecurity: No Food Insecurity (02/23/2023)   Hunger Vital Sign    Worried About Running Out of Food in the Last  Year: Never true    Ran Out of Food in the Last Year: Never true  Transportation Needs: No Transportation Needs (02/23/2023)   PRAPARE - Administrator, Civil Service (Medical): No    Lack of Transportation (Non-Medical): No  Physical Activity: Not on file  Stress: Not on file  Social Connections: Not on file  Intimate Partner Violence: Not At Risk (02/23/2023)   Humiliation, Afraid, Rape, and Kick  questionnaire    Fear of Current or Ex-Partner: No    Emotionally Abused: No    Physically Abused: No    Sexually Abused: No    FH:  Family History  Problem Relation Age of Onset   Premature CHD Brother     Past Medical History:  Diagnosis Date   Acute exacerbation of CHF (congestive heart failure) (HCC) 11/18/2021   AKI (acute kidney injury) (HCC) 11/26/2021   Anemia    CHF (congestive heart failure) (HCC)    Diabetes mellitus without complication (HCC) 03/13/2017   Currently no high BS.  Has come off meds.  But having episodes of low BS now.   Dilated idiopathic cardiomyopathy (HCC)    Dizziness 02/18/2017   GERD (gastroesophageal reflux disease)    Hyperkalemia 11/26/2021   Hyperlipidemia    Hypertension    Hyponatremia 06/09/2017   Hypothyroidism    Osteoporosis    Renal disorder    stage 3    Current Outpatient Medications  Medication Sig Dispense Refill   acetaminophen (TYLENOL) 325 MG tablet Take 2 tablets (650 mg total) by mouth every 4 (four) hours as needed for headache or mild pain.     alendronate (FOSAMAX) 70 MG tablet Take 70 mg by mouth once a week. Take with a full glass of water on an empty stomach.     apixaban (ELIQUIS) 2.5 MG TABS tablet Take 1 tablet (2.5 mg total) by mouth 2 (two) times daily. Start on 11/30/2021 60 tablet    arformoterol (BROVANA) 15 MCG/2ML NEBU Take 2 mLs (15 mcg total) by nebulization in the morning and at bedtime. 120 mL 6   ARTIFICIAL TEAR SOLUTION OP Apply 1 drop to eye 2 (two) times daily. Both Eyes. (Patient not taking: Reported on 05/12/2023)     atorvastatin (LIPITOR) 80 MG tablet Take 1 tablet (80 mg total) by mouth daily.     budesonide (PULMICORT) 0.25 MG/2ML nebulizer solution Take 2 mLs (0.25 mg total) by nebulization in the morning and at bedtime. 60 mL 12   calcium elemental as carbonate (TUMS ULTRA 1000) 400 MG chewable tablet Chew 1,000 mg by mouth 3 (three) times daily as needed for heartburn.     Cholecalciferol  (D3-1000) 25 MCG (1000 UT) capsule Take 1,000 Units by mouth daily.     Dextromethorphan-guaiFENesin 20-400 MG TABS Take 1 tablet by mouth 2 (two) times daily.     diclofenac Sodium (VOLTAREN) 1 % GEL Apply 2 g topically daily as needed (pain). Apply to back topically every day shift for pain.     ergocalciferol (VITAMIN D2) 50000 units capsule Take 50,000 Units by mouth once a week. Monday (Patient not taking: Reported on 05/12/2023)     ipratropium-albuterol (DUONEB) 0.5-2.5 (3) MG/3ML SOLN Take 3 mLs by nebulization 3 (three) times daily as needed. 360 mL 0   JARDIANCE 10 MG TABS tablet Take 10 mg by mouth daily.     levothyroxine (SYNTHROID, LEVOTHROID) 25 MCG tablet Take 25 mcg by mouth daily before breakfast.     magnesium  hydroxide (MILK OF MAGNESIA) 400 MG/5ML suspension Take 30 mLs by mouth daily as needed for mild constipation.     Nutritional Supplements (,FEEDING SUPPLEMENT, PROSOURCE PLUS) liquid Take 30 mLs by mouth daily.     Nutritional Supplements (ENSURE ENLIVE PO) Take 1 Can by mouth as directed. After meals if she eats less than 50 % due to poor appetite and weight loss s/p covid     OXYGEN 4lpm via [place] for SOB continuous.     pantoprazole (PROTONIX) 40 MG tablet Take 40 mg by mouth in the morning and at bedtime.     polyethylene glycol (MIRALAX / GLYCOLAX) 17 g packet Take 17 g by mouth every other day.     potassium chloride (KLOR-CON) 20 MEQ packet Take 20 mEq by mouth daily.     senna-docusate (SENOKOT-S) 8.6-50 MG tablet Take 2 tablets by mouth daily.     sertraline (ZOLOFT) 50 MG tablet Take 50 mg by mouth daily.     torsemide (DEMADEX) 10 MG tablet Take 20 mg by mouth daily.     valsartan (DIOVAN) 80 MG tablet Take 80 mg by mouth daily.     No current facility-administered medications for this visit.   Vitals:   06/15/23 1351 06/15/23 1355  BP: (!) 81/46 (!) 89/43  Pulse:  82  SpO2:  97%  Weight: 107 lb (48.5 kg)    Wt Readings from Last 3 Encounters:  06/15/23  107 lb (48.5 kg)  05/12/23 109 lb (49.4 kg)  04/01/23 118 lb (53.5 kg)   Lab Results  Component Value Date   CREATININE 1.2 (A) 05/18/2023   CREATININE 1.2 (A) 04/09/2023   CREATININE 1.1 03/19/2023    PHYSICAL EXAM:  General:  Well appearing. No resp difficulty HEENT: normal Neck: supple. JVP flat. No lymphadenopathy or thryomegaly appreciated. Cor: PMI normal. Regular rhythm, rate. No rubs, gallops or murmurs. Lungs: clear Abdomen: soft, nontender, nondistended. No hepatosplenomegaly. No bruits or masses. Extremities: no cyanosis, clubbing, rash, 1+ pitting edema bilateral lower legs  Neuro: alert & oriented x3, cranial nerves grossly intact. Moves all 4 extremities w/o difficulty. Affect pleasant.   ECG: not done   ASSESSMENT & PLAN:  1: NICM with preserved ejection fraction- - suspect due to atrial fibrillation - NYHA class III - euvolemic today - getting weighed daily at facility; reminded to call for an overnight weight gain of > 2 pounds or a weekly weight gain of > 5 pounds - Echo 06/02/17: EF 30-35% with mild LVH, mild AR, mild/ moderate MR/TR, mild LAE - Echo 01/05/19: EF 55-60% with mild LAE - Echo 11/18/21: EF 20-25% with severe LVH, Grade II DD, mild LAE, mild MR - Echo 11/27/21: EF 40-45% along with mild LVH and mild MR.  - Echo 03/06/23: EF 50% (@ [facility]) - continue jardiance 10mg  daily - continue torsemide 20mg  daily & 20mg  potassium - continue valsartan 80mg  daily but move this to bedtime due to low BP - has allergy to spironolactone & entresto (hypotension) - has been hypotensive in the past so may not be able to tolerate beta-blocker - had video visit with palliative care on 04/23 - not adding any salt to her food - wearing compression socks daily - BNP 02/22/23 was 746.2  2: HTN- - BP 81/46 & 89/42 in office this afternoon after morning medications - facility BP before meds is 118-135/62-72 - moving valsartan to bedtime per above - saw PCP (Jacqueline [name])  10/24 - BMP 05/18/23 reviewed and showed sodium  137, potassium 4.0, creatinine 1.2 and GFR 42  3: DM- - A1c 05/18/23 was 6.4%  4: PAF- - saw cardiology ([place]) 12/24 - continue apixaban 2.5mg  BID  5: Hyperlipidemia- - continue atorvastatin 80mg  daily - LDL 07/17/22 was 77  6: Possible reactive airway disease- - saw pulmonology ([place]) 11/24 - wearing oxygen at 4L around the clock   Due to HF stability and close follow-up with cardiology, will not make a return appointment at this time. Advised patient and her daughter that they could call back at anytime for questions or to make another appointment. Daughter was comfortable with this plan.

## 2023-06-15 ENCOUNTER — Encounter: Payer: Self-pay | Admitting: Adult Health

## 2023-06-15 ENCOUNTER — Ambulatory Visit: Payer: Medicare Other | Attending: Family | Admitting: Family

## 2023-06-15 ENCOUNTER — Encounter: Payer: Self-pay | Admitting: Family

## 2023-06-15 ENCOUNTER — Non-Acute Institutional Stay (SKILLED_NURSING_FACILITY): Payer: Self-pay | Admitting: Adult Health

## 2023-06-15 VITALS — BP 89/43 | HR 82 | Wt 107.0 lb

## 2023-06-15 DIAGNOSIS — I130 Hypertensive heart and chronic kidney disease with heart failure and stage 1 through stage 4 chronic kidney disease, or unspecified chronic kidney disease: Secondary | ICD-10-CM | POA: Insufficient documentation

## 2023-06-15 DIAGNOSIS — Z9981 Dependence on supplemental oxygen: Secondary | ICD-10-CM | POA: Diagnosis not present

## 2023-06-15 DIAGNOSIS — I10 Essential (primary) hypertension: Secondary | ICD-10-CM | POA: Diagnosis not present

## 2023-06-15 DIAGNOSIS — I252 Old myocardial infarction: Secondary | ICD-10-CM | POA: Insufficient documentation

## 2023-06-15 DIAGNOSIS — N189 Chronic kidney disease, unspecified: Secondary | ICD-10-CM | POA: Insufficient documentation

## 2023-06-15 DIAGNOSIS — K219 Gastro-esophageal reflux disease without esophagitis: Secondary | ICD-10-CM | POA: Diagnosis not present

## 2023-06-15 DIAGNOSIS — I480 Paroxysmal atrial fibrillation: Secondary | ICD-10-CM | POA: Diagnosis not present

## 2023-06-15 DIAGNOSIS — M79675 Pain in left toe(s): Secondary | ICD-10-CM | POA: Diagnosis not present

## 2023-06-15 DIAGNOSIS — I5022 Chronic systolic (congestive) heart failure: Secondary | ICD-10-CM | POA: Diagnosis not present

## 2023-06-15 DIAGNOSIS — I5032 Chronic diastolic (congestive) heart failure: Secondary | ICD-10-CM | POA: Diagnosis not present

## 2023-06-15 DIAGNOSIS — R21 Rash and other nonspecific skin eruption: Secondary | ICD-10-CM | POA: Diagnosis not present

## 2023-06-15 DIAGNOSIS — I509 Heart failure, unspecified: Secondary | ICD-10-CM | POA: Diagnosis not present

## 2023-06-15 DIAGNOSIS — Z87891 Personal history of nicotine dependence: Secondary | ICD-10-CM | POA: Diagnosis not present

## 2023-06-15 DIAGNOSIS — Z8673 Personal history of transient ischemic attack (TIA), and cerebral infarction without residual deficits: Secondary | ICD-10-CM | POA: Diagnosis not present

## 2023-06-15 DIAGNOSIS — Z7901 Long term (current) use of anticoagulants: Secondary | ICD-10-CM | POA: Diagnosis not present

## 2023-06-15 DIAGNOSIS — E785 Hyperlipidemia, unspecified: Secondary | ICD-10-CM | POA: Diagnosis not present

## 2023-06-15 DIAGNOSIS — E039 Hypothyroidism, unspecified: Secondary | ICD-10-CM | POA: Diagnosis not present

## 2023-06-15 DIAGNOSIS — I340 Nonrheumatic mitral (valve) insufficiency: Secondary | ICD-10-CM | POA: Diagnosis not present

## 2023-06-15 DIAGNOSIS — E119 Type 2 diabetes mellitus without complications: Secondary | ICD-10-CM

## 2023-06-15 DIAGNOSIS — E1122 Type 2 diabetes mellitus with diabetic chronic kidney disease: Secondary | ICD-10-CM | POA: Diagnosis not present

## 2023-06-15 DIAGNOSIS — I428 Other cardiomyopathies: Secondary | ICD-10-CM | POA: Diagnosis not present

## 2023-06-15 DIAGNOSIS — J45909 Unspecified asthma, uncomplicated: Secondary | ICD-10-CM

## 2023-06-15 DIAGNOSIS — R0602 Shortness of breath: Secondary | ICD-10-CM | POA: Diagnosis present

## 2023-06-15 NOTE — Progress Notes (Unsigned)
Location:  Other [name] Home Room Number: [facility]) Place of Service:  SNF (31) Provider:  Kenard Gower, DNP, FNP-BC  Patient Care Team: Jacqueline Mealing, MD as PCP - General (Family Medicine) Jacqueline Gutter, RN as Registered Nurse  Extended Emergency Contact Information Primary Emergency Contact: Jacqueline [name] Address: [address]          [place], [place] [address] Jacqueline [name] of [place] Home Phone: [phone] Mobile Phone: [phone] Relation: Daughter Secondary Emergency Contact: Jacqueline [name] [place] of [place] Home Phone: [phone] Relation: Son  Code Status:  ***  Goals of care: Advanced Directive information    05/12/2023   11:23 AM  Advanced Directives  Does Patient Have a Medical Advance Directive? Yes  Type of Advance Directive Out of facility DNR (pink MOST or yellow form)  Does patient want to make changes to medical advance directive? No - Patient declined  Pre-existing out of facility DNR order (yellow form or pink MOST form) Yellow form placed in chart (order not valid for inpatient use)     Chief Complaint  Patient presents with   Acute Visit    dry blister on right hip and red area on left great toe    HPI:  Pt is a 89 y.o. female seen today for medical management of chronic diseases.  ***   Past Medical History:  Diagnosis Date   Acute exacerbation of CHF (congestive heart failure) (HCC) 11/18/2021   AKI (acute kidney injury) (HCC) 11/26/2021   Anemia    CHF (congestive heart failure) (HCC)    Diabetes mellitus without complication (HCC) 03/13/2017   Currently no high BS.  Has come off meds.  But having episodes of low BS now.   Dilated idiopathic cardiomyopathy (HCC)    Dizziness 02/18/2017   GERD (gastroesophageal reflux disease)    Hyperkalemia 11/26/2021   Hyperlipidemia    Hypertension    Hyponatremia 06/09/2017   Hypothyroidism    Osteoporosis    Renal disorder     stage 3   Past Surgical History:  Procedure Laterality Date   BUNIONECTOMY     COLONOSCOPY     ESOPHAGOGASTRODUODENOSCOPY (EGD) WITH PROPOFOL N/A 10/15/2017   Procedure: ESOPHAGOGASTRODUODENOSCOPY (EGD) WITH PROPOFOL;  Surgeon: Christena Deem, MD;  Location: [facility] ENDOSCOPY;  Service: Endoscopy;  Laterality: N/A;   KYPHOPLASTY N/A 06/12/2017   Procedure: [id number];  Surgeon: Kennedy Bucker, MD;  Location: [facility] ORS;  Service: Orthopedics;  Laterality: N/A;   TONSILLECTOMY      Allergies  Allergen Reactions   Ciprofloxacin    Digoxin And Related    Entresto [Sacubitril-Valsartan] Other (See Comments)    Hypotensive    Erythromycin    Lopid [Gemfibrozil]    Nitrofurantoin    Spironolactone Other (See Comments)    Hospitalized for dehydration   Cefuroxime Rash   Penicillins Rash    Has patient had a PCN reaction causing immediate rash, facial/tongue/throat swelling, SOB or lightheadedness with hypotension: Unknown Has patient had a PCN reaction causing severe rash involving mucus membranes or skin necrosis: No Has patient had a PCN reaction that required hospitalization: No Has patient had a PCN reaction occurring within the last 10 years: No If all of the above answers are "NO", then may proceed with Cephalosporin use.     Outpatient Encounter Medications as of 06/15/2023  Medication Sig   acetaminophen (TYLENOL) 325 MG tablet Take 2 tablets (650 mg total) by mouth every 4 (four) hours as needed for  headache or mild pain.   alendronate (FOSAMAX) 70 MG tablet Take 70 mg by mouth once a week. Take with a full glass of water on an empty stomach.   apixaban (ELIQUIS) 2.5 MG TABS tablet Take 1 tablet (2.5 mg total) by mouth 2 (two) times daily. Start on 11/30/2021   arformoterol (BROVANA) 15 MCG/2ML NEBU Take 2 mLs (15 mcg total) by nebulization in the morning and at bedtime.   ARTIFICIAL TEAR SOLUTION OP Apply 1 drop to eye 2 (two) times daily. Both Eyes.   atorvastatin  (LIPITOR) 80 MG tablet Take 1 tablet (80 mg total) by mouth daily.   budesonide (PULMICORT) 0.25 MG/2ML nebulizer solution Take 2 mLs (0.25 mg total) by nebulization in the morning and at bedtime.   calcium elemental as carbonate (TUMS ULTRA 1000) 400 MG chewable tablet Chew 1,000 mg by mouth 3 (three) times daily as needed for heartburn.   Cholecalciferol (D3-1000) 25 MCG (1000 UT) capsule Take 1,000 Units by mouth daily.   Dextromethorphan-guaiFENesin 20-400 MG TABS Take 1 tablet by mouth 2 (two) times daily.   diclofenac Sodium (VOLTAREN) 1 % GEL Apply 2 g topically daily as needed (pain). Apply to back topically every day shift for pain.   ergocalciferol (VITAMIN D2) 50000 units capsule Take 50,000 Units by mouth once a week. Monday   ipratropium-albuterol (DUONEB) 0.5-2.5 (3) MG/3ML SOLN Take 3 mLs by nebulization 3 (three) times daily as needed.   JARDIANCE 10 MG TABS tablet Take 10 mg by mouth daily.   levothyroxine (SYNTHROID, LEVOTHROID) 25 MCG tablet Take 25 mcg by mouth daily before breakfast.   magnesium hydroxide (MILK OF MAGNESIA) 400 MG/5ML suspension Take 30 mLs by mouth daily as needed for mild constipation.   Nutritional Supplements (,FEEDING SUPPLEMENT, PROSOURCE PLUS) liquid Take 30 mLs by mouth daily.   Nutritional Supplements (ENSURE ENLIVE PO) Take 1 Can by mouth as directed. After meals if she eats less than 50 % due to poor appetite and weight loss s/p covid   OXYGEN 4lpm via [place] for SOB continuous.   pantoprazole (PROTONIX) 40 MG tablet Take 40 mg by mouth in the morning and at bedtime.   polyethylene glycol (MIRALAX / GLYCOLAX) 17 g packet Take 17 g by mouth every other day.   potassium chloride (KLOR-CON) 20 MEQ packet Take 20 mEq by mouth daily.   senna-docusate (SENOKOT-S) 8.6-50 MG tablet Take 2 tablets by mouth daily.   sertraline (ZOLOFT) 50 MG tablet Take 50 mg by mouth daily.   torsemide (DEMADEX) 10 MG tablet Take 20 mg by mouth daily.   valsartan (DIOVAN) 80 MG  tablet Take 80 mg by mouth at bedtime.   No facility-administered encounter medications on file as of 06/15/2023.    Review of Systems ***    Immunization History  Administered Date(s) Administered   Influenza Inj Mdck Quad Pf 03/10/2019, 03/18/2021   Influenza Split 04/18/2014, 05/01/2015   Influenza, High Dose Seasonal PF 04/01/2022   Influenza,inj,Quad PF,6+ Mos 03/12/2020   Influenza-Unspecified 03/24/2012, 03/24/2013, 04/18/2014, 05/01/2015, 04/08/2023   PFIZER Comirnaty(Gray Top)Covid-19 Tri-Sucrose Vaccine 07/21/2019, 08/12/2019   PNEUMOCOCCAL CONJUGATE-20 04/29/2022   Pfizer Covid-19 Vaccine Bivalent Booster 78yrs & up 11/22/2021   Pneumococcal Polysaccharide-23 08/20/2015, 04/29/2022   Unspecified SARS-COV-2 Vaccination 07/21/2019, 08/11/2019   Zoster Recombinant(Shingrix) 04/29/2022   Pertinent  Health Maintenance Due  Topic Date Due   OPHTHALMOLOGY EXAM  08/25/2023   HEMOGLOBIN A1C  11/16/2023   FOOT EXAM  12/09/2023   INFLUENZA VACCINE  Completed  DEXA SCAN  Completed      11/28/2021    7:57 PM 11/29/2021    7:59 AM 12/18/2021   12:34 PM 08/14/2022   11:35 AM 10/23/2022    2:27 PM  Fall Risk  Falls in the past year?   0 0 0  Was there an injury with Fall?   0 0   Fall Risk Category Calculator   0 0   Fall Risk Category (Retired)   Low    (RETIRED) Patient Fall Risk Level High fall risk High fall risk Moderate fall risk    Patient at Risk for Falls Due to   Impaired mobility;Impaired balance/gait No Fall Risks   Fall risk Follow up   Falls evaluation completed;Education provided;Falls prevention discussed Falls evaluation completed      Vitals:   06/15/23 1657  BP: 118/67  Pulse: 70  Resp: 18  Temp: (!) 97.5 F (36.4 C)  SpO2: 94%  Weight: 113 lb 12.8 oz (51.6 kg)  Height: 4\' 9"  (1.448 m)   Body mass index is 24.63 kg/m.  Physical Exam     Labs reviewed: Recent Labs    02/22/23 0745 02/23/23 0213 02/25/23 0957 03/02/23 0000 03/19/23 0000  04/09/23 0000 05/18/23 0000  NA 135 138 138   < > 141 141 137  K 3.7 3.5 3.5   < > 3.3* 3.8 4.0  CL 100 102 104   < > 102 103 103  CO2 25 26 26    < > 25* 28* 27*  GLUCOSE 145* 111* 157*  --   --   --   --   BUN 26* 24* 23   < > 28* 36* 46*  CREATININE 1.04* 0.94 0.79   < > 1.1 1.2* 1.2*  CALCIUM 8.7* 8.2* 8.2*   < > 8.8 8.4* 8.6*   < > = values in this interval not displayed.   Recent Labs    08/18/22 0000 02/22/23 0745  AST 28 20  ALT 21 15  ALKPHOS 89 91  BILITOT  --  1.1  PROT  --  7.9  ALBUMIN 3.7 3.8   Recent Labs    02/22/23 0745 02/23/23 0213 02/25/23 0957 03/02/23 0000 03/09/23 0000 04/06/23 0000 05/07/23 0000  WBC 15.0* 10.9* 8.2   < > 5.2 10.1 9.7  NEUTROABS  --   --  6.0   < > 2,974.00 7,302.00 6,295.00  HGB 14.7 13.1 13.9   < > 12.3 13.9 14.7  HCT 47.0* 41.6 45.0   < > 41 46 48*  MCV 84.7 83.2 84.0  --   --   --   --   PLT 249 210 237   < > 171 210 187   < > = values in this interval not displayed.   Lab Results  Component Value Date   TSH 2.925 11/18/2021   Lab Results  Component Value Date   HGBA1C 6.4 05/18/2023   Lab Results  Component Value Date   CHOL 146 07/17/2022   HDL 38 07/17/2022   LDLCALC 77 07/17/2022   TRIG 226 (A) 07/17/2022   CHOLHDL 7.7 11/27/2021    Significant Diagnostic Results in last 30 days:  No results found.  Assessment/Plan     Family/ staff Communication: Discussed plan of care with resident and charge nurse  Labs/tests ordered:     Jacqueline Gower, DNP, MSN, FNP-BC [facility] [phone] (Monday-Friday 8:00 a.m. - 5:00 p.m.) [phone] (after hours)

## 2023-06-15 NOTE — Patient Instructions (Signed)
Please call us in the future if you have any concerns or symptoms.  Have a Happy New Year!

## 2023-06-16 ENCOUNTER — Non-Acute Institutional Stay (INDEPENDENT_AMBULATORY_CARE_PROVIDER_SITE_OTHER): Payer: Self-pay | Admitting: Nurse Practitioner

## 2023-06-16 ENCOUNTER — Encounter: Payer: Self-pay | Admitting: Nurse Practitioner

## 2023-06-16 DIAGNOSIS — I482 Chronic atrial fibrillation, unspecified: Secondary | ICD-10-CM | POA: Diagnosis not present

## 2023-06-16 DIAGNOSIS — N939 Abnormal uterine and vaginal bleeding, unspecified: Secondary | ICD-10-CM

## 2023-06-16 DIAGNOSIS — E1122 Type 2 diabetes mellitus with diabetic chronic kidney disease: Secondary | ICD-10-CM

## 2023-06-16 DIAGNOSIS — L89893 Pressure ulcer of other site, stage 3: Secondary | ICD-10-CM

## 2023-06-16 DIAGNOSIS — E119 Type 2 diabetes mellitus without complications: Secondary | ICD-10-CM

## 2023-06-16 DIAGNOSIS — F3341 Major depressive disorder, recurrent, in partial remission: Secondary | ICD-10-CM

## 2023-06-16 DIAGNOSIS — I5022 Chronic systolic (congestive) heart failure: Secondary | ICD-10-CM | POA: Diagnosis not present

## 2023-06-16 DIAGNOSIS — I10 Essential (primary) hypertension: Secondary | ICD-10-CM

## 2023-06-16 DIAGNOSIS — N183 Chronic kidney disease, stage 3 unspecified: Secondary | ICD-10-CM

## 2023-06-16 DIAGNOSIS — D500 Iron deficiency anemia secondary to blood loss (chronic): Secondary | ICD-10-CM

## 2023-06-16 DIAGNOSIS — J9611 Chronic respiratory failure with hypoxia: Secondary | ICD-10-CM

## 2023-06-16 NOTE — Progress Notes (Signed)
 Location:  Other [facility].  Nursing Home Room Number: [facility] [address] Place of Service:  SNF [phone]) Harlene An, NP  PCP: Abdul Fine, MD  Patient Care Team: Abdul Fine, MD as PCP - General (Family Medicine) Maurie Rayfield BIRCH, RN as Registered Nurse  Extended Emergency Contact Information Primary Emergency Contact: Waddell Edna ORN Address: [address]          [place], [place] [address] [place] Home Phone: [phone] Mobile Phone: [phone] Relation: Daughter Secondary Emergency Contact: Avants,Steven  [place] Home Phone: [phone] Relation: Son  Goals of care: Advanced Directive information    06/16/2023    8:29 AM  Advanced Directives  Does Patient Have a Medical Advance Directive? Yes  Type of Advance Directive Out of facility DNR (pink MOST or yellow form)  Does patient want to make changes to medical advance directive? No - Patient declined     Chief Complaint  Patient presents with   Medical Management of Chronic Issues    Medical Management of Chronic Issues.     HPI:  Pt is a 89 y.o. female seen today for medical management of chronic disease.  Pt was seen yesterday due to toe pain and rash Continues to have pain in toe- area red She continues to have intermittent vaginal bleeding but without significant change.  She followed up with cardiologist yesterday Euvolemic at this time. Continues on jardiance  and torsemide    Without worsening shortness of breath. Doing well on pulmicort  and brovana    Bowels moving well.   Past Medical History:  Diagnosis Date   Acute exacerbation of CHF (congestive heart failure) (HCC) 11/18/2021   AKI (acute kidney injury) (HCC) 11/26/2021   Anemia    CHF (congestive heart failure) (HCC)    Diabetes mellitus without complication (HCC) 03/13/2017   Currently no high BS.  Has come off meds.  But having episodes of low BS now.   Dilated idiopathic cardiomyopathy (HCC)     Dizziness 02/18/2017   GERD (gastroesophageal reflux disease)    Hyperkalemia 11/26/2021   Hyperlipidemia    Hypertension    Hyponatremia 06/09/2017   Hypothyroidism    Osteoporosis    Renal disorder    stage 3   Past Surgical History:  Procedure Laterality Date   BUNIONECTOMY     COLONOSCOPY     ESOPHAGOGASTRODUODENOSCOPY (EGD) WITH PROPOFOL  N/A 10/15/2017   Procedure: ESOPHAGOGASTRODUODENOSCOPY (EGD) WITH PROPOFOL ;  Surgeon: Gaylyn Gladis PENNER, MD;  Location: [facility] ENDOSCOPY;  Service: Endoscopy;  Laterality: N/A;   KYPHOPLASTY N/A 06/12/2017   Procedure: [id number]-U3;  Surgeon: Kathlynn Sharper, MD;  Location: [facility] ORS;  Service: Orthopedics;  Laterality: N/A;   TONSILLECTOMY      Allergies  Allergen Reactions   Ciprofloxacin    Digoxin  And Related    Entresto  [Sacubitril -Valsartan ] Other (See Comments)    Hypotensive    Erythromycin    Lopid [Gemfibrozil]    Nitrofurantoin    Spironolactone Other (See Comments)    Hospitalized for dehydration   Cefuroxime Rash   Penicillins Rash    Has patient had a PCN reaction causing immediate rash, facial/tongue/throat swelling, SOB or lightheadedness with hypotension: Unknown Has patient had a PCN reaction causing severe rash involving mucus membranes or skin necrosis: No Has patient had a PCN reaction that required hospitalization: No Has patient had a PCN reaction occurring within the last 10 years: No If all of the above answers are NO, then may proceed with Cephalosporin use.  Outpatient Encounter Medications as of 06/16/2023  Medication Sig   acetaminophen  (TYLENOL ) 325 MG tablet Take 2 tablets (650 mg total) by mouth every 4 (four) hours as needed for headache or mild pain.   alendronate (FOSAMAX) 70 MG tablet Take 70 mg by mouth once a week. Take with a full glass of water on an empty stomach.   apixaban  (ELIQUIS ) 2.5 MG TABS tablet Take 1 tablet (2.5 mg total) by mouth 2 (two) times daily. Start on 11/30/2021    arformoterol  (BROVANA ) 15 MCG/2ML NEBU Take 2 mLs (15 mcg total) by nebulization in the morning and at bedtime.   ARTIFICIAL TEAR SOLUTION OP Apply 1 drop to eye 2 (two) times daily. Both Eyes.   atorvastatin  (LIPITOR ) 80 MG tablet Take 1 tablet (80 mg total) by mouth daily.   budesonide  (PULMICORT ) 0.25 MG/2ML nebulizer solution Take 2 mLs (0.25 mg total) by nebulization in the morning and at bedtime.   Cholecalciferol (D3-1000) 25 MCG (1000 UT) capsule Take 1,000 Units by mouth daily.   Dextromethorphan -guaiFENesin  20-400 MG TABS Take 1 tablet by mouth 2 (two) times daily.   diclofenac  Sodium (VOLTAREN ) 1 % GEL Apply 2 g topically daily as needed (pain). Apply to back topically every day shift for pain.   hydrocortisone (ANUSOL-HC) 25 MG suppository Place 25 mg rectally 2 (two) times daily as needed for hemorrhoids or anal itching.   JARDIANCE  10 MG TABS tablet Take 10 mg by mouth daily.   levothyroxine  (SYNTHROID , LEVOTHROID) 25 MCG tablet Take 25 mcg by mouth daily before breakfast.   Nutritional Supplements (,FEEDING SUPPLEMENT, PROSOURCE PLUS) liquid Take 30 mLs by mouth daily.   Nutritional Supplements (ENSURE ENLIVE PO) Take 1 Can by mouth as directed. After meals if she eats less than 50 % due to poor appetite and weight loss s/p covid   OXYGEN 4lpm via [place] for SOB continuous.   pantoprazole  (PROTONIX ) 40 MG tablet Take 40 mg by mouth in the morning and at bedtime.   polyethylene glycol (MIRALAX  / GLYCOLAX ) 17 g packet Take 17 g by mouth every other day.   potassium chloride  (KLOR-CON ) 20 MEQ packet Take 20 mEq by mouth daily.   senna-docusate (SENOKOT-S) 8.6-50 MG tablet Take 2 tablets by mouth daily.   sertraline  (ZOLOFT ) 50 MG tablet Take 50 mg by mouth daily.   torsemide  (DEMADEX ) 20 MG tablet Take 40 mg by mouth daily.   TRIAMCINOLONE ACETONIDE, TOP, 0.05 % OINT Apply to right hip rash topically two times daily.   valsartan  (DIOVAN ) 80 MG tablet Take 80 mg by mouth at bedtime.    calcium  elemental as carbonate (TUMS ULTRA 1000) 400 MG chewable tablet Chew 1,000 mg by mouth 3 (three) times daily as needed for heartburn.   [DISCONTINUED] ergocalciferol  (VITAMIN D2) 50000 units capsule Take 50,000 Units by mouth once a week. Monday   [DISCONTINUED] ipratropium-albuterol  (DUONEB) 0.5-2.5 (3) MG/3ML SOLN Take 3 mLs by nebulization 3 (three) times daily as needed.   [DISCONTINUED] magnesium hydroxide (MILK OF MAGNESIA) 400 MG/5ML suspension Take 30 mLs by mouth daily as needed for mild constipation.   [DISCONTINUED] torsemide  (DEMADEX ) 10 MG tablet Take 20 mg by mouth daily.   No facility-administered encounter medications on file as of 06/16/2023.    Review of Systems  Constitutional:  Negative for activity change, appetite change, fatigue and unexpected weight change.  HENT:  Negative for congestion and hearing loss.   Eyes: Negative.   Respiratory:  Negative for cough and shortness of breath.  Cardiovascular:  Negative for chest pain, palpitations and leg swelling.  Gastrointestinal:  Negative for abdominal pain, constipation and diarrhea.  Genitourinary:  Negative for difficulty urinating and dysuria.  Musculoskeletal:  Positive for arthralgias and joint swelling. Negative for myalgias.  Skin:  Negative for color change and wound.  Neurological:  Negative for dizziness and weakness.  Psychiatric/Behavioral:  Positive for confusion. Negative for agitation and behavioral problems.      Immunization History  Administered Date(s) Administered   Influenza Inj Mdck Quad Pf 03/10/2019, 03/18/2021   Influenza Split 04/18/2014, 05/01/2015   Influenza, High Dose Seasonal PF 04/01/2022   Influenza,inj,Quad PF,6+ Mos 03/12/2020   Influenza-Unspecified 03/24/2012, 03/24/2013, 04/18/2014, 05/01/2015, 04/08/2023   PFIZER Comirnaty(Gray Top)Covid-19 Tri-Sucrose Vaccine 07/21/2019, 08/12/2019   PNEUMOCOCCAL CONJUGATE-20 04/29/2022   Pfizer Covid-19 Vaccine Bivalent Booster  75yrs & up 11/22/2021   Pneumococcal Polysaccharide-23 08/20/2015, 04/29/2022   Unspecified SARS-COV-2 Vaccination 07/21/2019, 08/11/2019   Zoster Recombinant(Shingrix) 04/29/2022   Pertinent  Health Maintenance Due  Topic Date Due   OPHTHALMOLOGY EXAM  08/25/2023   HEMOGLOBIN A1C  11/16/2023   FOOT EXAM  12/09/2023   INFLUENZA VACCINE  Completed   DEXA SCAN  Completed      11/28/2021    7:57 PM 11/29/2021    7:59 AM 12/18/2021   12:34 PM 08/14/2022   11:35 AM 10/23/2022    2:27 PM  Fall Risk  Falls in the past year?   0 0 0  Was there an injury with Fall?   0 0   Fall Risk Category Calculator   0 0   Fall Risk Category (Retired)   Low    (RETIRED) Patient Fall Risk Level High fall risk High fall risk Moderate fall risk    Patient at Risk for Falls Due to   Impaired mobility;Impaired balance/gait No Fall Risks   Fall risk Follow up   Falls evaluation completed;Education provided;Falls prevention discussed Falls evaluation completed    Functional Status Survey:    Vitals:   06/16/23 0813  BP: 118/67  Pulse: 70  Resp: 18  Temp: (!) 97.5 F (36.4 C)  SpO2: 93%  Weight: 113 lb 12.8 oz (51.6 kg)  Height: 4' 9 (1.448 m)   Body mass index is 24.63 kg/m. Physical Exam Constitutional:      General: She is not in acute distress.    Appearance: She is well-developed. She is not diaphoretic.  HENT:     Head: Normocephalic and atraumatic.     Mouth/Throat:     Pharynx: No oropharyngeal exudate.  Eyes:     Conjunctiva/sclera: Conjunctivae normal.     Pupils: Pupils are equal, round, and reactive to light.  Cardiovascular:     Rate and Rhythm: Normal rate and regular rhythm.     Heart sounds: Normal heart sounds.  Pulmonary:     Effort: Pulmonary effort is normal.     Breath sounds: Normal breath sounds.  Abdominal:     General: Bowel sounds are normal.     Palpations: Abdomen is soft.  Musculoskeletal:     Cervical back: Normal range of motion and neck supple.     Right  lower leg: No edema.     Left lower leg: No edema.  Feet:     Right foot:     Skin integrity: Blister (noted to left great toe- mild redness surrounding) present.  Skin:    General: Skin is warm and dry.  Neurological:     Mental Status: She is alert.  Psychiatric:        Mood and Affect: Mood normal.     Labs reviewed: Recent Labs    02/22/23 0745 02/23/23 0213 02/25/23 0957 03/02/23 0000 03/19/23 0000 04/09/23 0000 05/18/23 0000  NA 135 138 138   < > 141 141 137  K 3.7 3.5 3.5   < > 3.3* 3.8 4.0  CL 100 102 104   < > 102 103 103  CO2 25 26 26    < > 25* 28* 27*  GLUCOSE 145* 111* 157*  --   --   --   --   BUN 26* 24* 23   < > 28* 36* 46*  CREATININE 1.04* 0.94 0.79   < > 1.1 1.2* 1.2*  CALCIUM  8.7* 8.2* 8.2*   < > 8.8 8.4* 8.6*   < > = values in this interval not displayed.   Recent Labs    08/18/22 0000 02/22/23 0745  AST 28 20  ALT 21 15  ALKPHOS 89 91  BILITOT  --  1.1  PROT  --  7.9  ALBUMIN 3.7 3.8   Recent Labs    02/22/23 0745 02/23/23 0213 02/25/23 0957 03/02/23 0000 04/06/23 0000 05/07/23 0000 06/04/23 0000 06/08/23 0000  WBC 15.0* 10.9* 8.2   < > 10.1 9.7 5.8 5.2  NEUTROABS  --   --  6.0   < > 7,302.00 6,295.00 3,822.00  --   HGB 14.7 13.1 13.9   < > 13.9 14.7 12.2 12.2  HCT 47.0* 41.6 45.0   < > 46 48* 40 40  MCV 84.7 83.2 84.0  --   --   --   --   --   PLT 249 210 237   < > 210 187 172 155   < > = values in this interval not displayed.   Lab Results  Component Value Date   TSH 2.925 11/18/2021   Lab Results  Component Value Date   HGBA1C 6.4 05/18/2023   Lab Results  Component Value Date   CHOL 146 07/17/2022   HDL 38 07/17/2022   LDLCALC 77 07/17/2022   TRIG 226 (A) 07/17/2022   CHOLHDL 7.7 11/27/2021    Significant Diagnostic Results in last 30 days:  No results found.  Assessment/Plan 1. Type 2 diabetes mellitus without complication, without long-term current use of insulin  (HCC) (Primary) -Encouraged dietary  compliance, routine foot care/monitoring and to keep up with diabetic eye exams through ophthalmology   2. Chronic atrial fibrillation (HCC) Rate controlled. Continues on eliquis  for anticoagulation  3. Vaginal bleeding Stable at this time, may need to hold eliquis  if worsens/continues  4. Chronic systolic CHF (congestive heart failure), NYHA class 3 (HCC) -euvolemic, continue current regimen  5. CKD stage 3 due to type 2 diabetes mellitus (HCC) -Chronic and stable Encourage proper hydration Follow metabolic panel Avoid nephrotoxic meds (NSAIDS)   6. Primary hypertension Blood pressure well controlled, goal bp <140/90 Continue current medications and dietary modifications follow metabolic panel  7. Chronic respiratory failure with hypoxia (HCC) Stable, maintained on 4L [place]  8. Recurrent major depressive disorder, in partial remission (HCC) Stable on zoloft   9. Pressure injury of toe of left foot, stage 3 (HCC) Tenderness noted, gout level pending Will have wound care follow  Pressure reduction in place  10. Iron  deficiency anemia due to chronic blood loss -will follow up cbc and iron  studies, having chronic vaginal bleeding. No further work up, due to comorbidites and has been discussed with daughter  11. Hypothyroid TSH next lab day  Reianna Batdorf K. Caro BODILY [facility] [phone]

## 2023-06-18 LAB — CBC AND DIFFERENTIAL
HCT: 42 (ref 36–46)
Hemoglobin: 12.8 (ref 12.0–16.0)
Platelets: 194 10*3/uL (ref 150–400)
WBC: 4.3

## 2023-06-18 LAB — IRON,TIBC AND FERRITIN PANEL
%SAT: 8
Ferritin: 28
Iron: 23
TIBC: 300

## 2023-06-18 LAB — TSH: TSH: 5.8 (ref 0.41–5.90)

## 2023-06-18 LAB — CBC: RBC: 5.17 — AB (ref 3.87–5.11)

## 2023-06-29 LAB — BASIC METABOLIC PANEL
BUN: 40 — AB (ref 4–21)
CO2: 31 — AB (ref 13–22)
Chloride: 101 (ref 99–108)
Creatinine: 1.3 — AB (ref 0.5–1.1)
Glucose: 84
Potassium: 4 meq/L (ref 3.5–5.1)
Sodium: 138 (ref 137–147)

## 2023-06-29 LAB — COMPREHENSIVE METABOLIC PANEL
Calcium: 8.9 (ref 8.7–10.7)
eGFR: 38

## 2023-07-20 LAB — CBC: RBC: 5.05 (ref 3.87–5.11)

## 2023-07-20 LAB — CBC AND DIFFERENTIAL
HCT: 41 (ref 36–46)
Hemoglobin: 12.4 (ref 12.0–16.0)
Neutrophils Absolute: 3597
Platelets: 192 10*3/uL (ref 150–400)
WBC: 6.4

## 2023-07-29 ENCOUNTER — Encounter: Payer: Self-pay | Admitting: Family

## 2023-07-29 ENCOUNTER — Non-Acute Institutional Stay (SKILLED_NURSING_FACILITY): Payer: Self-pay | Admitting: Student

## 2023-07-29 ENCOUNTER — Encounter: Payer: Self-pay | Admitting: Student

## 2023-07-29 DIAGNOSIS — I252 Old myocardial infarction: Secondary | ICD-10-CM

## 2023-07-29 DIAGNOSIS — I5023 Acute on chronic systolic (congestive) heart failure: Secondary | ICD-10-CM

## 2023-07-29 DIAGNOSIS — E1122 Type 2 diabetes mellitus with diabetic chronic kidney disease: Secondary | ICD-10-CM

## 2023-07-29 DIAGNOSIS — N183 Chronic kidney disease, stage 3 unspecified: Secondary | ICD-10-CM

## 2023-07-29 DIAGNOSIS — Z9981 Dependence on supplemental oxygen: Secondary | ICD-10-CM | POA: Diagnosis not present

## 2023-07-29 NOTE — Progress Notes (Signed)
Location:  Other Nursing Home Room Number: [facility] [address] Place of Service:  SNF [phone]) Provider:  Ander Gaster, Benetta Spar, MD  Patient Care Team: Earnestine Mealing, MD as PCP - General (Family Medicine) Benita Gutter, RN as Registered Nurse  Extended Emergency Contact Information Primary Emergency Contact: [name] Address: [address]          [place], [place] [address] [name] Home Phone: [phone] Mobile Phone: [phone] Relation: Daughter Secondary Emergency Contact: [name] [place] Home Phone: [phone] Relation: Son  Code Status:  DNR Goals of care: Advanced Directive information    06/16/2023    8:29 AM  Advanced Directives  Does Patient Have a Medical Advance Directive? Yes  Type of Advance Directive Out of facility DNR (pink MOST or yellow form)  Does patient want to make changes to medical advance directive? No - Patient declined     Chief Complaint  Patient presents with   Medical Management of Chronic Issues    Medical Management of Chronic Issues.     HPI:  Pt is a 89 y.o. female seen today for medical management of chronic diseases.   History of Present Illness The patient presents with shortness of breath and leg swelling. She is accompanied by her daughter.  Shortness of breath began a couple of days ago, accompanied by purple discoloration of the fingers during the day, which may affect oxygen saturation readings. Anxiety is noted, potentially contributing to breathing difficulties. She does not always communicate when experiencing breathing issues.  There is swelling in the leg, with a weight increase from 107 pounds at the beginning of December to 114 pounds recently. This weight gain was initially seen as positive but has become challenging to manage.  Current medications include Eliquis 2.5 mg BID, Jardiance 10 mg, LABA BID, budesonide BID, valsartan, levothyroxine, iron, and torsemide,  taken once daily by mouth.   Past Medical History:  Diagnosis Date   Acute exacerbation of CHF (congestive heart failure) (HCC) 11/18/2021   AKI (acute kidney injury) (HCC) 11/26/2021   Anemia    CHF (congestive heart failure) (HCC)    Diabetes mellitus without complication (HCC) 03/13/2017   Currently no high BS.  Has come off meds.  But having episodes of low BS now.   Dilated idiopathic cardiomyopathy (HCC)    Dizziness 02/18/2017   GERD (gastroesophageal reflux disease)    Hyperkalemia 11/26/2021   Hyperlipidemia    Hypertension    Hyponatremia 06/09/2017   Hypothyroidism    Osteoporosis    Renal disorder    stage 3   Past Surgical History:  Procedure Laterality Date   BUNIONECTOMY     COLONOSCOPY     ESOPHAGOGASTRODUODENOSCOPY (EGD) WITH PROPOFOL N/A 10/15/2017   Procedure: ESOPHAGOGASTRODUODENOSCOPY (EGD) WITH PROPOFOL;  Surgeon: Christena Deem, MD;  Location: [facility] ENDOSCOPY;  Service: Endoscopy;  Laterality: N/A;   KYPHOPLASTY N/A 06/12/2017   Procedure: [id number];  Surgeon: Kennedy Bucker, MD;  Location: [facility] ORS;  Service: Orthopedics;  Laterality: N/A;   TONSILLECTOMY      Allergies  Allergen Reactions   Ciprofloxacin    Digoxin And Related    Entresto [Sacubitril-Valsartan] Other (See Comments)    Hypotensive    Erythromycin    Lopid [Gemfibrozil]    Nitrofurantoin    Spironolactone Other (See Comments)    Hospitalized for dehydration   Cefuroxime Rash   Penicillins Rash    Has patient had a PCN reaction causing immediate rash, facial/tongue/throat  swelling, SOB or lightheadedness with hypotension: Unknown Has patient had a PCN reaction causing severe rash involving mucus membranes or skin necrosis: No Has patient had a PCN reaction that required hospitalization: No Has patient had a PCN reaction occurring within the last 10 years: No If all of the above answers are "NO", then may proceed with Cephalosporin use.     Outpatient Encounter  Medications as of 07/29/2023  Medication Sig   acetaminophen (TYLENOL) 325 MG tablet Take 2 tablets (650 mg total) by mouth every 4 (four) hours as needed for headache or mild pain.   alendronate (FOSAMAX) 70 MG tablet Take 70 mg by mouth once a week. Take with a full glass of water on an empty stomach.   apixaban (ELIQUIS) 2.5 MG TABS tablet Take 1 tablet (2.5 mg total) by mouth 2 (two) times daily. Start on 11/30/2021   arformoterol (BROVANA) 15 MCG/2ML NEBU Take 2 mLs (15 mcg total) by nebulization in the morning and at bedtime.   ARTIFICIAL TEAR SOLUTION OP Apply 1 drop to eye 2 (two) times daily. Both Eyes.   atorvastatin (LIPITOR) 80 MG tablet Take 1 tablet (80 mg total) by mouth daily.   budesonide (PULMICORT) 0.25 MG/2ML nebulizer solution Take 2 mLs (0.25 mg total) by nebulization in the morning and at bedtime.   Cholecalciferol (D3-1000) 25 MCG (1000 UT) capsule Take 1,000 Units by mouth daily.   Dextromethorphan-guaiFENesin 20-400 MG TABS Take 1 tablet by mouth 2 (two) times daily.   diclofenac Sodium (VOLTAREN) 1 % GEL Apply 2 g topically daily as needed (pain). Apply to back topically every day shift for pain.   ferrous sulfate 325 (65 FE) MG EC tablet Take 325 mg by mouth. Every Monday, Wednesday and Friday.   hydrocortisone (ANUSOL-HC) 25 MG suppository Place 25 mg rectally 2 (two) times daily as needed for hemorrhoids or anal itching.   JARDIANCE 10 MG TABS tablet Take 10 mg by mouth daily.   levothyroxine (SYNTHROID, LEVOTHROID) 25 MCG tablet Take 25 mcg by mouth daily before breakfast.   Nutritional Supplements (,FEEDING SUPPLEMENT, PROSOURCE PLUS) liquid Take 30 mLs by mouth daily.   Nutritional Supplements (ENSURE ENLIVE PO) Take 1 Can by mouth as directed. After meals if she eats less than 50 % due to poor appetite and weight loss s/p covid   OXYGEN 4lpm via [place] for SOB continuous.   pantoprazole (PROTONIX) 40 MG tablet Take 40 mg by mouth in the morning and at bedtime.    polyethylene glycol (MIRALAX / GLYCOLAX) 17 g packet Take 17 g by mouth every other day.   potassium chloride (KLOR-CON) 20 MEQ packet Take 20 mEq by mouth daily.   senna-docusate (SENOKOT-S) 8.6-50 MG tablet Take 2 tablets by mouth daily.   sertraline (ZOLOFT) 50 MG tablet Take 50 mg by mouth daily.   torsemide (DEMADEX) 20 MG tablet Take 20 mg by mouth daily.   valsartan (DIOVAN) 80 MG tablet Take 80 mg by mouth at bedtime.   [DISCONTINUED] calcium elemental as carbonate (TUMS ULTRA 1000) 400 MG chewable tablet Chew 1,000 mg by mouth 3 (three) times daily as needed for heartburn.   [DISCONTINUED] TRIAMCINOLONE ACETONIDE, TOP, 0.05 % OINT Apply to right hip rash topically two times daily.   No facility-administered encounter medications on file as of 07/29/2023.    Review of Systems  Immunization History  Administered Date(s) Administered   Influenza Inj Mdck Quad Pf 03/10/2019, 03/18/2021   Influenza Split 04/18/2014, 05/01/2015   Influenza, High Dose  Seasonal PF 04/01/2022   Influenza,inj,Quad PF,6+ Mos 03/12/2020   Influenza-Unspecified 03/24/2012, 03/24/2013, 04/18/2014, 05/01/2015, 04/08/2023   PFIZER Comirnaty(Gray Top)Covid-19 Tri-Sucrose Vaccine 07/21/2019, 08/12/2019   PNEUMOCOCCAL CONJUGATE-20 04/29/2022   Pfizer Covid-19 Vaccine Bivalent Booster 22yrs & up 11/22/2021   Pneumococcal Polysaccharide-23 08/20/2015, 04/29/2022   Unspecified SARS-COV-2 Vaccination 07/21/2019, 08/11/2019   Zoster Recombinant(Shingrix) 04/29/2022   Pertinent  Health Maintenance Due  Topic Date Due   OPHTHALMOLOGY EXAM  08/25/2023   HEMOGLOBIN A1C  11/16/2023   FOOT EXAM  12/09/2023   INFLUENZA VACCINE  Completed   DEXA SCAN  Completed      11/28/2021    7:57 PM 11/29/2021    7:59 AM 12/18/2021   12:34 PM 08/14/2022   11:35 AM 10/23/2022    2:27 PM  Fall Risk  Falls in the past year?   0 0 0  Was there an injury with Fall?   0 0   Fall Risk Category Calculator   0 0   Fall Risk Category  (Retired)   Low    (RETIRED) Patient Fall Risk Level High fall risk High fall risk Moderate fall risk    Patient at Risk for Falls Due to   Impaired mobility;Impaired balance/gait No Fall Risks   Fall risk Follow up   Falls evaluation completed;Education provided;Falls prevention discussed Falls evaluation completed    Functional Status Survey:    Vitals:   07/29/23 1224  BP: 110/61  Pulse: 96  Resp: 20  Temp: (!) 97.2 F (36.2 C)  SpO2: 95%  Weight: 114 lb 12.8 oz (52.1 kg)  Height: 4\' 9"  (1.448 m)   Body mass index is 24.84 kg/m. Physical Exam Physical Exam VITALS: SaO2 fluctuated between 98% and 93%. CHEST: Lungs clear to auscultation. EXTREMITIES: Leg edema. SKIN: Fingernails with purple discoloration.   Labs reviewed: Recent Labs    02/22/23 0745 02/23/23 0213 02/25/23 0957 03/02/23 0000 04/09/23 0000 05/18/23 0000 06/29/23 0000  NA 135 138 138   < > 141 137 138  K 3.7 3.5 3.5   < > 3.8 4.0 4.0  CL 100 102 104   < > 103 103 101  CO2 25 26 26    < > 28* 27* 31*  GLUCOSE 145* 111* 157*  --   --   --   --   BUN 26* 24* 23   < > 36* 46* 40*  CREATININE 1.04* 0.94 0.79   < > 1.2* 1.2* 1.3*  CALCIUM 8.7* 8.2* 8.2*   < > 8.4* 8.6* 8.9   < > = values in this interval not displayed.   Recent Labs    08/18/22 0000 02/22/23 0745  AST 28 20  ALT 21 15  ALKPHOS 89 91  BILITOT  --  1.1  PROT  --  7.9  ALBUMIN 3.7 3.8   Recent Labs    02/22/23 0745 02/23/23 0213 02/25/23 0957 03/02/23 0000 05/07/23 0000 06/04/23 0000 06/08/23 0000 06/18/23 0000 07/20/23 0000  WBC 15.0* 10.9* 8.2   < > 9.7 5.8 5.2 4.3 6.4  NEUTROABS  --   --  6.0   < > 6,295.00 3,822.00  --   --  3,597.00  HGB 14.7 13.1 13.9   < > 14.7 12.2 12.2 12.8 12.4  HCT 47.0* 41.6 45.0   < > 48* 40 40 42 41  MCV 84.7 83.2 84.0  --   --   --   --   --   --   PLT 249 210  237   < > 187 172 155 194 192   < > = values in this interval not displayed.   Lab Results  Component Value Date   TSH 5.80  06/18/2023   Lab Results  Component Value Date   HGBA1C 6.4 05/18/2023   Lab Results  Component Value Date   CHOL 146 07/17/2022   HDL 38 07/17/2022   LDLCALC 77 07/17/2022   TRIG 226 (A) 07/17/2022   CHOLHDL 7.7 11/27/2021   Results LABS BNP: 634 pg/mL (06/2023)  Assessment/Plan Heart Failure  CKD 3 Presents with symptoms of heart failure, including dyspnea and peripheral edema. Weight increased from 107 lbs in early December to 114 lbs, suggesting fluid retention. BNP in January was 634, indicating ongoing heart failure. Kidney function complicates diuretic management. Discussed risks of increased diuretic use, including potential renal function decline, and benefits such as reduced fluid overload and improved breathing. Plan to monitor renal function closely. Contacted patient's daughter who scheduled her an appointment with CHF clinic tomorrow afternoon. Currently requiring 5 LNC. O2 difficult to perform. Discussed potential medications to help with her breathing. Daughter states, her primary goal is for her mother to remain comfortable. Will plan to f/u on Friday to evaluate success of treatment plan.  - Administer 40 mg of Lasix tonight and tomorrow - Alternate 40 mg of Lasix on odd days and 20 mg on even days starting tomorrow - Order BNP on Monday - Order BMP in one week  Chronic Obstructive Pulmonary Disease (COPD) On albuterol, budesonide, and another medication for COPD. Anxiety may exacerbate breathing difficulties. Discussed that anxiety management may improve respiratory symptoms. - Continue current COPD medications: albuterol, budesonide, and other prescribed medications - some concern this could be related to CHF, if no improvement with diuretics, will plan for steroid burst.  Follow-up - Follow-up with cardiologist - Call patient's daughter to update on the plan. Family/ staff Communication: Mardene Celeste, Nursing  Labs/tests ordered:  BMP, BNP

## 2023-07-30 ENCOUNTER — Other Ambulatory Visit: Payer: Self-pay

## 2023-07-30 ENCOUNTER — Encounter: Payer: Self-pay | Admitting: *Deleted

## 2023-07-30 ENCOUNTER — Ambulatory Visit: Payer: Medicare Other | Attending: Internal Medicine | Admitting: Internal Medicine

## 2023-07-30 ENCOUNTER — Inpatient Hospital Stay
Admission: EM | Admit: 2023-07-30 | Discharge: 2023-08-03 | DRG: 291 | Disposition: A | Discharge status: Skilled Nursing Facility | Payer: Medicare Other | Source: Ambulatory Visit | Attending: Internal Medicine | Admitting: Internal Medicine

## 2023-07-30 ENCOUNTER — Emergency Department: Payer: Medicare Other

## 2023-07-30 VITALS — BP 87/44 | HR 61

## 2023-07-30 DIAGNOSIS — N179 Acute kidney failure, unspecified: Secondary | ICD-10-CM | POA: Diagnosis present

## 2023-07-30 DIAGNOSIS — D631 Anemia in chronic kidney disease: Secondary | ICD-10-CM | POA: Diagnosis present

## 2023-07-30 DIAGNOSIS — Z881 Allergy status to other antibiotic agents status: Secondary | ICD-10-CM

## 2023-07-30 DIAGNOSIS — I4819 Other persistent atrial fibrillation: Secondary | ICD-10-CM | POA: Diagnosis present

## 2023-07-30 DIAGNOSIS — Z66 Do not resuscitate: Secondary | ICD-10-CM | POA: Diagnosis present

## 2023-07-30 DIAGNOSIS — E785 Hyperlipidemia, unspecified: Secondary | ICD-10-CM | POA: Diagnosis present

## 2023-07-30 DIAGNOSIS — Z7901 Long term (current) use of anticoagulants: Secondary | ICD-10-CM | POA: Diagnosis not present

## 2023-07-30 DIAGNOSIS — F3341 Major depressive disorder, recurrent, in partial remission: Secondary | ICD-10-CM | POA: Diagnosis present

## 2023-07-30 DIAGNOSIS — E872 Acidosis, unspecified: Secondary | ICD-10-CM | POA: Diagnosis present

## 2023-07-30 DIAGNOSIS — J449 Chronic obstructive pulmonary disease, unspecified: Secondary | ICD-10-CM | POA: Diagnosis present

## 2023-07-30 DIAGNOSIS — E1122 Type 2 diabetes mellitus with diabetic chronic kidney disease: Secondary | ICD-10-CM | POA: Diagnosis present

## 2023-07-30 DIAGNOSIS — Z9981 Dependence on supplemental oxygen: Secondary | ICD-10-CM

## 2023-07-30 DIAGNOSIS — Z888 Allergy status to other drugs, medicaments and biological substances status: Secondary | ICD-10-CM

## 2023-07-30 DIAGNOSIS — M810 Age-related osteoporosis without current pathological fracture: Secondary | ICD-10-CM | POA: Diagnosis present

## 2023-07-30 DIAGNOSIS — N1831 Chronic kidney disease, stage 3a: Secondary | ICD-10-CM | POA: Diagnosis present

## 2023-07-30 DIAGNOSIS — E875 Hyperkalemia: Secondary | ICD-10-CM | POA: Diagnosis present

## 2023-07-30 DIAGNOSIS — Z87891 Personal history of nicotine dependence: Secondary | ICD-10-CM

## 2023-07-30 DIAGNOSIS — Z1152 Encounter for screening for COVID-19: Secondary | ICD-10-CM | POA: Diagnosis not present

## 2023-07-30 DIAGNOSIS — I5033 Acute on chronic diastolic (congestive) heart failure: Secondary | ICD-10-CM | POA: Diagnosis present

## 2023-07-30 DIAGNOSIS — L89153 Pressure ulcer of sacral region, stage 3: Secondary | ICD-10-CM | POA: Diagnosis present

## 2023-07-30 DIAGNOSIS — I5023 Acute on chronic systolic (congestive) heart failure: Secondary | ICD-10-CM | POA: Diagnosis present

## 2023-07-30 DIAGNOSIS — I482 Chronic atrial fibrillation, unspecified: Secondary | ICD-10-CM | POA: Diagnosis present

## 2023-07-30 DIAGNOSIS — R0603 Acute respiratory distress: Secondary | ICD-10-CM | POA: Diagnosis present

## 2023-07-30 DIAGNOSIS — I5082 Biventricular heart failure: Secondary | ICD-10-CM | POA: Diagnosis present

## 2023-07-30 DIAGNOSIS — J9601 Acute respiratory failure with hypoxia: Secondary | ICD-10-CM

## 2023-07-30 DIAGNOSIS — I420 Dilated cardiomyopathy: Secondary | ICD-10-CM | POA: Diagnosis present

## 2023-07-30 DIAGNOSIS — I2510 Atherosclerotic heart disease of native coronary artery without angina pectoris: Secondary | ICD-10-CM | POA: Diagnosis present

## 2023-07-30 DIAGNOSIS — I959 Hypotension, unspecified: Secondary | ICD-10-CM | POA: Diagnosis present

## 2023-07-30 DIAGNOSIS — I509 Heart failure, unspecified: Secondary | ICD-10-CM

## 2023-07-30 DIAGNOSIS — Z7984 Long term (current) use of oral hypoglycemic drugs: Secondary | ICD-10-CM

## 2023-07-30 DIAGNOSIS — M1990 Unspecified osteoarthritis, unspecified site: Secondary | ICD-10-CM | POA: Diagnosis present

## 2023-07-30 DIAGNOSIS — J9621 Acute and chronic respiratory failure with hypoxia: Secondary | ICD-10-CM | POA: Diagnosis present

## 2023-07-30 DIAGNOSIS — E119 Type 2 diabetes mellitus without complications: Secondary | ICD-10-CM

## 2023-07-30 DIAGNOSIS — I6930 Unspecified sequelae of cerebral infarction: Secondary | ICD-10-CM

## 2023-07-30 DIAGNOSIS — E039 Hypothyroidism, unspecified: Secondary | ICD-10-CM | POA: Diagnosis present

## 2023-07-30 DIAGNOSIS — I4892 Unspecified atrial flutter: Secondary | ICD-10-CM | POA: Diagnosis present

## 2023-07-30 DIAGNOSIS — R230 Cyanosis: Secondary | ICD-10-CM | POA: Diagnosis present

## 2023-07-30 DIAGNOSIS — J441 Chronic obstructive pulmonary disease with (acute) exacerbation: Secondary | ICD-10-CM

## 2023-07-30 DIAGNOSIS — Z880 Allergy status to penicillin: Secondary | ICD-10-CM

## 2023-07-30 DIAGNOSIS — I130 Hypertensive heart and chronic kidney disease with heart failure and stage 1 through stage 4 chronic kidney disease, or unspecified chronic kidney disease: Principal | ICD-10-CM | POA: Diagnosis present

## 2023-07-30 DIAGNOSIS — I10 Essential (primary) hypertension: Secondary | ICD-10-CM | POA: Diagnosis not present

## 2023-07-30 DIAGNOSIS — I252 Old myocardial infarction: Secondary | ICD-10-CM

## 2023-07-30 DIAGNOSIS — Z79899 Other long term (current) drug therapy: Secondary | ICD-10-CM

## 2023-07-30 DIAGNOSIS — F0390 Unspecified dementia without behavioral disturbance: Secondary | ICD-10-CM | POA: Diagnosis present

## 2023-07-30 DIAGNOSIS — J9622 Acute and chronic respiratory failure with hypercapnia: Secondary | ICD-10-CM | POA: Diagnosis not present

## 2023-07-30 DIAGNOSIS — R000 Tachycardia, unspecified: Secondary | ICD-10-CM | POA: Diagnosis present

## 2023-07-30 DIAGNOSIS — Z7983 Long term (current) use of bisphosphonates: Secondary | ICD-10-CM

## 2023-07-30 DIAGNOSIS — Z79890 Hormone replacement therapy (postmenopausal): Secondary | ICD-10-CM

## 2023-07-30 DIAGNOSIS — K219 Gastro-esophageal reflux disease without esophagitis: Secondary | ICD-10-CM | POA: Diagnosis present

## 2023-07-30 DIAGNOSIS — Z8673 Personal history of transient ischemic attack (TIA), and cerebral infarction without residual deficits: Secondary | ICD-10-CM

## 2023-07-30 LAB — CBC
HCT: 49.7 % — ABNORMAL HIGH (ref 36.0–46.0)
Hemoglobin: 14.9 g/dL (ref 12.0–15.0)
MCH: 25.5 pg — ABNORMAL LOW (ref 26.0–34.0)
MCHC: 30 g/dL (ref 30.0–36.0)
MCV: 85 fL (ref 80.0–100.0)
Platelets: 178 10*3/uL (ref 150–400)
RBC: 5.85 MIL/uL — ABNORMAL HIGH (ref 3.87–5.11)
RDW: 22.4 % — ABNORMAL HIGH (ref 11.5–15.5)
WBC: 11.8 10*3/uL — ABNORMAL HIGH (ref 4.0–10.5)
nRBC: 0 % (ref 0.0–0.2)

## 2023-07-30 LAB — RESP PANEL BY RT-PCR (RSV, FLU A&B, COVID)  RVPGX2
Influenza A by PCR: NEGATIVE
Influenza B by PCR: NEGATIVE
Resp Syncytial Virus by PCR: NEGATIVE
SARS Coronavirus 2 by RT PCR: NEGATIVE

## 2023-07-30 LAB — BLOOD GAS, ARTERIAL
Acid-base deficit: 7.7 mmol/L — ABNORMAL HIGH (ref 0.0–2.0)
Acid-base deficit: 6.3 mmol/L — ABNORMAL HIGH (ref 0.0–2.0)
Bicarbonate: 16.6 mmol/L — ABNORMAL LOW (ref 20.0–28.0)
Bicarbonate: 18.1 mmol/L — ABNORMAL LOW (ref 20.0–28.0)
FIO2: 40 %
O2 Content: 5 L/min
O2 Content: 40 L/min
O2 Saturation: 93.3 %
O2 Saturation: 98.5 %
Patient temperature: 37
Patient temperature: 37
pCO2 arterial: 30 mm[Hg] — ABNORMAL LOW (ref 32–48)
pCO2 arterial: 32 mm[Hg] (ref 32–48)
pH, Arterial: 7.35 (ref 7.35–7.45)
pH, Arterial: 7.36 (ref 7.35–7.45)
pO2, Arterial: 66 mm[Hg] — ABNORMAL LOW (ref 83–108)
pO2, Arterial: 88 mm[Hg] (ref 83–108)

## 2023-07-30 LAB — BASIC METABOLIC PANEL
Anion gap: 11 (ref 5–15)
BUN: 52 mg/dL — ABNORMAL HIGH (ref 8–23)
CO2: 17 mmol/L — ABNORMAL LOW (ref 22–32)
Calcium: 8.8 mg/dL — ABNORMAL LOW (ref 8.9–10.3)
Chloride: 110 mmol/L (ref 98–111)
Creatinine, Ser: 1.86 mg/dL — ABNORMAL HIGH (ref 0.44–1.00)
GFR, Estimated: 26 mL/min — ABNORMAL LOW (ref >60)
Glucose, Bld: 109 mg/dL — ABNORMAL HIGH (ref 70–99)
Potassium: 5.1 mmol/L (ref 3.5–5.1)
Sodium: 138 mmol/L (ref 135–145)

## 2023-07-30 LAB — TROPONIN I (HIGH SENSITIVITY)
Troponin I (High Sensitivity): 35 ng/L — ABNORMAL HIGH (ref <18)
Troponin I (High Sensitivity): 37 ng/L — ABNORMAL HIGH (ref <18)

## 2023-07-30 LAB — CBG MONITORING, ED: Glucose-Capillary: 105 mg/dL — ABNORMAL HIGH (ref 70–99)

## 2023-07-30 LAB — PROTIME-INR
INR: 1.4 — ABNORMAL HIGH (ref 0.8–1.2)
Prothrombin Time: 17.4 s — ABNORMAL HIGH (ref 11.4–15.2)

## 2023-07-30 LAB — BRAIN NATRIURETIC PEPTIDE: B Natriuretic Peptide: 1404.2 pg/mL — ABNORMAL HIGH (ref 0.0–100.0)

## 2023-07-30 MED ORDER — INSULIN ASPART 100 UNIT/ML IJ SOLN
0.0000 [IU] | Freq: Every day | INTRAMUSCULAR | Status: DC
  Filled 2023-07-30: qty 1

## 2023-07-30 MED ORDER — ARFORMOTEROL TARTRATE 15 MCG/2ML IN NEBU
15.0000 ug | INHALATION_SOLUTION | Freq: Two times a day (BID) | RESPIRATORY_TRACT | Status: DC
  Administered 2023-07-31 – 2023-08-03 (×7): 15 ug via RESPIRATORY_TRACT
  Filled 2023-07-30 (×8): qty 2

## 2023-07-30 MED ORDER — APIXABAN 2.5 MG PO TABS
2.5000 mg | ORAL_TABLET | Freq: Two times a day (BID) | ORAL | Status: DC
  Administered 2023-07-31 – 2023-08-03 (×8): 2.5 mg via ORAL
  Filled 2023-07-30 (×8): qty 1

## 2023-07-30 MED ORDER — EMPAGLIFLOZIN 10 MG PO TABS
10.0000 mg | ORAL_TABLET | Freq: Every day | ORAL | Status: DC
  Administered 2023-07-31: 10 mg via ORAL
  Filled 2023-07-30: qty 1

## 2023-07-30 MED ORDER — INSULIN ASPART 100 UNIT/ML IJ SOLN
0.0000 [IU] | Freq: Three times a day (TID) | INTRAMUSCULAR | Status: DC
  Administered 2023-08-02: 2 [IU] via SUBCUTANEOUS
  Filled 2023-07-30 (×5): qty 1

## 2023-07-30 MED ORDER — SERTRALINE HCL 50 MG PO TABS
50.0000 mg | ORAL_TABLET | Freq: Every day | ORAL | Status: DC
  Administered 2023-07-31 – 2023-08-03 (×4): 50 mg via ORAL
  Filled 2023-07-30 (×4): qty 1

## 2023-07-30 MED ORDER — PANTOPRAZOLE SODIUM 40 MG PO TBEC
40.0000 mg | DELAYED_RELEASE_TABLET | Freq: Two times a day (BID) | ORAL | Status: DC
  Administered 2023-07-31 – 2023-08-03 (×8): 40 mg via ORAL
  Filled 2023-07-30 (×8): qty 1

## 2023-07-30 MED ORDER — ATORVASTATIN CALCIUM 80 MG PO TABS
80.0000 mg | ORAL_TABLET | Freq: Every day | ORAL | Status: DC
  Administered 2023-07-31 – 2023-08-03 (×4): 80 mg via ORAL
  Filled 2023-07-30 (×3): qty 1
  Filled 2023-07-30: qty 4

## 2023-07-30 MED ORDER — BUDESONIDE 0.25 MG/2ML IN SUSP
0.2500 mg | Freq: Two times a day (BID) | RESPIRATORY_TRACT | Status: DC
  Administered 2023-07-31 – 2023-08-03 (×7): 0.25 mg via RESPIRATORY_TRACT
  Filled 2023-07-30 (×7): qty 2

## 2023-07-30 MED ORDER — ACETAMINOPHEN 325 MG PO TABS
650.0000 mg | ORAL_TABLET | Freq: Four times a day (QID) | ORAL | Status: DC | PRN
  Filled 2023-07-30: qty 2

## 2023-07-30 MED ORDER — LEVOTHYROXINE SODIUM 50 MCG PO TABS
50.0000 ug | ORAL_TABLET | Freq: Every day | ORAL | Status: DC
  Administered 2023-07-31 – 2023-08-03 (×4): 50 ug via ORAL
  Filled 2023-07-30 (×4): qty 1

## 2023-07-30 MED ORDER — ACETAMINOPHEN 650 MG RE SUPP: 650.0000 mg | Freq: Four times a day (QID) | RECTAL | Status: DC | PRN

## 2023-07-30 MED ORDER — ONDANSETRON HCL 4 MG/2ML IJ SOLN: 4.0000 mg | Freq: Four times a day (QID) | INTRAMUSCULAR | Status: DC | PRN

## 2023-07-30 MED ORDER — HYDROCODONE-ACETAMINOPHEN 5-325 MG PO TABS: 1.0000 | ORAL_TABLET | ORAL | Status: DC | PRN

## 2023-07-30 MED ORDER — ONDANSETRON HCL 4 MG PO TABS: 4.0000 mg | ORAL_TABLET | Freq: Four times a day (QID) | ORAL | Status: DC | PRN

## 2023-07-30 MED ORDER — FUROSEMIDE 10 MG/ML IJ SOLN
40.0000 mg | Freq: Once | INTRAMUSCULAR | Status: AC
  Administered 2023-07-30: 40 mg via INTRAVENOUS
  Filled 2023-07-30: qty 4

## 2023-07-30 MED ORDER — IRBESARTAN 75 MG PO TABS
75.0000 mg | ORAL_TABLET | Freq: Every day | ORAL | Status: DC
  Administered 2023-07-31: 75 mg via ORAL
  Filled 2023-07-30: qty 1

## 2023-07-30 MED ORDER — SENNOSIDES-DOCUSATE SODIUM 8.6-50 MG PO TABS
2.0000 | ORAL_TABLET | Freq: Every day | ORAL | Status: DC
  Administered 2023-07-31 – 2023-08-03 (×5): 2 via ORAL
  Filled 2023-07-30 (×5): qty 2

## 2023-07-30 MED ORDER — FUROSEMIDE 10 MG/ML IJ SOLN
40.0000 mg | Freq: Two times a day (BID) | INTRAMUSCULAR | Status: DC
Start: 2023-07-30 — End: 2023-07-31
  Administered 2023-07-31: 40 mg via INTRAVENOUS
  Filled 2023-07-30 (×2): qty 4

## 2023-07-30 MED ORDER — POTASSIUM CHLORIDE 20 MEQ PO PACK
20.0000 meq | PACK | Freq: Every day | ORAL | Status: DC
  Administered 2023-07-31 – 2023-08-03 (×4): 20 meq via ORAL
  Filled 2023-07-30 (×4): qty 1

## 2023-07-30 NOTE — H&P (Signed)
History and Physical    [name] QIH:[id number] DOB: 06/26/1932 DOA: 07/30/2023 DOS: the patient was seen and examined on 07/30/2023 PCP: Earnestine Mealing, MD  Patient coming from: ALF/ILF  Chief Complaint:  Chief Complaint  Patient presents with   Shortness of Breath    HPI: Jacqueline [name] is a 89 y.o. female with medical history significant for NSTEMI 11/2021, T2DM, paroxysmal atrial fibrillation on Eliquis, CVA 11/2021, HTN, hypothyroidism, anemia, COPD on home O2 at 4 L, HFrEF with improved EF secondary to NICM, last EF 50% in September 2024 (previously 20 to 25% 2023), who was sent in by cardiology office where she presented today with shortness of breath due to concerns for CHF and hypoxia.  Patient was noted to be volume overloaded and O2 sats were in the 80s at her facility at [facility]  There was also concern for her having cyanotic peripheries.  Daughter at the bedside contributes to history. ED course: Soft blood pressure to 103/56 on arrival with tachypnea to 28.  O2 sat was in the low 90s on 5 L. ABG on 5 L showed pO2 of 66 and she was subsequently transition to heated high flow with improvement in PaO2 to 88 and improvement in cyanosis of fingers. Troponin 37 and BNP 1404 Respiratory viral panel negative for COVID flu and RSV Creatinine 1.86, slightly above baseline of 1.2 with bicarb of 17 WBC 11,800 EKG personally viewed and interpreted showing sinus at 87 with T wave inversion lateral leads Chest x-ray with stable chronic findings.  No consolidation or effusion. Patient treated with IV Lasix Hospitalist consulted for admission.     Past Medical History:  Diagnosis Date   Acute exacerbation of CHF (congestive heart failure) (HCC) 11/18/2021   AKI (acute kidney injury) (HCC) 11/26/2021   Anemia    CHF (congestive heart failure) (HCC)    Diabetes mellitus without complication (HCC) 03/13/2017   Currently no high BS.  Has come off meds.  But having  episodes of low BS now.   Dilated idiopathic cardiomyopathy (HCC)    Dizziness 02/18/2017   GERD (gastroesophageal reflux disease)    Hyperkalemia 11/26/2021   Hyperlipidemia    Hypertension    Hyponatremia 06/09/2017   Hypothyroidism    Osteoporosis    Renal disorder    stage 3   Past Surgical History:  Procedure Laterality Date   BUNIONECTOMY     COLONOSCOPY     ESOPHAGOGASTRODUODENOSCOPY (EGD) WITH PROPOFOL N/A 10/15/2017   Procedure: ESOPHAGOGASTRODUODENOSCOPY (EGD) WITH PROPOFOL;  Surgeon: Christena Deem, MD;  Location: [facility] ENDOSCOPY;  Service: Endoscopy;  Laterality: N/A;   KYPHOPLASTY N/A 06/12/2017   Procedure: [id number];  Surgeon: Kennedy Bucker, MD;  Location: [facility] ORS;  Service: Orthopedics;  Laterality: N/A;   TONSILLECTOMY     Social History:  reports that she quit smoking about 39 years ago. Her smoking use included cigarettes. She has never used smokeless tobacco. She reports that she does not drink alcohol and does not use drugs.  Allergies  Allergen Reactions   Ciprofloxacin    Digoxin And Related    Entresto [Sacubitril-Valsartan] Other (See Comments)    Hypotensive    Erythromycin    Lopid [Gemfibrozil]    Nitrofurantoin    Spironolactone Other (See Comments)    Hospitalized for dehydration   Cefuroxime Rash   Penicillins Rash    Has patient had a PCN reaction causing immediate rash, facial/tongue/throat swelling, SOB or lightheadedness with hypotension: Unknown Has patient had a  PCN reaction causing severe rash involving mucus membranes or skin necrosis: No Has patient had a PCN reaction that required hospitalization: No Has patient had a PCN reaction occurring within the last 10 years: No If all of the above answers are "NO", then may proceed with Cephalosporin use.     Family History  Problem Relation Age of Onset   Premature CHD Brother     Prior to Admission medications   Medication Sig Start Date End Date Taking? Authorizing  Provider  acetaminophen (TYLENOL) 325 MG tablet Take 2 tablets (650 mg total) by mouth every 4 (four) hours as needed for headache or mild pain. 11/29/21   Pennie Banter, DO  alendronate (FOSAMAX) 70 MG tablet Take 70 mg by mouth once a week. Take with a full glass of water on an empty stomach.    [provider]  apixaban (ELIQUIS) 2.5 MG TABS tablet Take 1 tablet (2.5 mg total) by mouth 2 (two) times daily. Start on 11/30/2021 11/29/21   Pennie Banter, DO  arformoterol (BROVANA) 15 MCG/2ML NEBU Take 2 mLs (15 mcg total) by nebulization in the morning and at bedtime. 05/06/23   Assaker, [place], MD  ARTIFICIAL TEAR SOLUTION OP Apply 1 drop to eye 2 (two) times daily. Both Eyes.    [provider]  atorvastatin (LIPITOR) 80 MG tablet Take 1 tablet (80 mg total) by mouth daily. 11/30/21   Esaw Grandchild A, DO  budesonide (PULMICORT) 0.25 MG/2ML nebulizer solution Take 2 mLs (0.25 mg total) by nebulization in the morning and at bedtime. 05/06/23   Assaker, [place], MD  Cholecalciferol (D3-1000) 25 MCG (1000 UT) capsule Take 1,000 Units by mouth daily.    [provider]  Dextromethorphan-guaiFENesin 20-400 MG TABS Take 1 tablet by mouth 2 (two) times daily.    [provider]  diclofenac Sodium (VOLTAREN) 1 % GEL Apply 2 g topically daily as needed (pain). Apply to back topically every day shift for pain.    [provider]  ferrous sulfate 325 (65 FE) MG EC tablet Take 325 mg by mouth. Every Monday, Wednesday and Friday.    [provider]  hydrocortisone (ANUSOL-HC) 25 MG suppository Place 25 mg rectally 2 (two) times daily as needed for hemorrhoids or anal itching.    [provider]  JARDIANCE 10 MG TABS tablet Take 10 mg by mouth daily. 09/23/21   [provider]  levothyroxine (SYNTHROID) 50 MCG tablet Take 50 mcg by mouth daily before breakfast.    [provider]  Nutritional Supplements (,FEEDING  SUPPLEMENT, PROSOURCE PLUS) liquid Take 30 mLs by mouth daily.    [provider]  Nutritional Supplements (ENSURE ENLIVE PO) Take 1 Can by mouth as directed. After meals if she eats less than 50 % due to poor appetite and weight loss s/p covid    [provider]  OXYGEN 4lpm via [place] for SOB continuous.    [provider]  pantoprazole (PROTONIX) 40 MG tablet Take 40 mg by mouth in the morning and at bedtime.    [provider]  polyethylene glycol (MIRALAX / GLYCOLAX) 17 g packet Take 17 g by mouth every other day.    [provider]  potassium chloride (KLOR-CON) 20 MEQ packet Take 20 mEq by mouth daily.    [provider]  senna-docusate (SENOKOT-S) 8.6-50 MG tablet Take 2 tablets by mouth daily.    [provider]  sertraline (ZOLOFT) 50 MG tablet Take 50 mg by  mouth daily.    [provider]  torsemide (DEMADEX) 20 MG tablet Take 20 mg by mouth daily.    [provider]  valsartan (DIOVAN) 80 MG tablet Take 80 mg by mouth at bedtime.    [provider]    Physical Exam: Vitals:   07/30/23 1840 07/30/23 1842 07/30/23 1943 07/30/23 2113  BP: 118/61  120/60 (!) 100/53  Pulse: 81 81 82 83  Resp:   18 (!) 2  Temp:   98.4 F (36.9 C)   TempSrc:   Oral   SpO2:  92% 93% 96%  Weight:      Height:       Physical Exam Vitals and nursing note reviewed.  Constitutional:      General: She is sleeping. She is not in acute distress.    Comments: Chronically ill-appearing female  HENT:     Head: Normocephalic and atraumatic.  Cardiovascular:     Rate and Rhythm: Normal rate and regular rhythm.     Heart sounds: Normal heart sounds.  Pulmonary:     Effort: Pulmonary effort is normal.     Breath sounds: Rales present.  Abdominal:     Palpations: Abdomen is soft.     Tenderness: There is no abdominal tenderness.  Musculoskeletal:     Right lower leg: Edema present.     Left lower leg: Edema present.   Neurological:     Mental Status: She is easily aroused. Mental status is at baseline.     Labs on Admission: I have personally reviewed following labs and imaging studies  CBC: Recent Labs  Lab 07/30/23 1546  WBC 11.8*  HGB 14.9  HCT 49.7*  MCV 85.0  PLT 178   Basic Metabolic Panel: Recent Labs  Lab 07/30/23 1546  NA 138  K 5.1  CL 110  CO2 17*  GLUCOSE 109*  BUN 52*  CREATININE 1.86*  CALCIUM 8.8*   GFR: Estimated Creatinine Clearance: 14.5 mL/min (A) (by C-G formula based on SCr of 1.86 mg/dL (H)). Liver Function Tests: No results for input(s): "AST", "ALT", "ALKPHOS", "BILITOT", "PROT", "ALBUMIN" in the last 168 hours. No results for input(s): "LIPASE", "AMYLASE" in the last 168 hours. No results for input(s): "AMMONIA" in the last 168 hours. Coagulation Profile: Recent Labs  Lab 07/30/23 1546  INR 1.4*   Cardiac Enzymes: No results for input(s): "CKTOTAL", "CKMB", "CKMBINDEX", "TROPONINI" in the last 168 hours. BNP (last 3 results) No results for input(s): "PROBNP" in the last 8760 hours. HbA1C: No results for input(s): "HGBA1C" in the last 72 hours. CBG: Recent Labs  Lab 07/30/23 2108  GLUCAP 105*   Lipid Profile: No results for input(s): "CHOL", "HDL", "LDLCALC", "TRIG", "CHOLHDL", "LDLDIRECT" in the last 72 hours. Thyroid Function Tests: No results for input(s): "TSH", "T4TOTAL", "FREET4", "T3FREE", "THYROIDAB" in the last 72 hours. Anemia Panel: No results for input(s): "VITAMINB12", "FOLATE", "FERRITIN", "TIBC", "IRON", "RETICCTPCT" in the last 72 hours. Urine analysis:    Component Value Date/Time   COLORURINE YELLOW (A) 02/22/2023 0745   APPEARANCEUR TURBID (A) 02/22/2023 0745   APPEARANCEUR Clear 06/13/2014 2201   LABSPEC 1.007 02/22/2023 0745   LABSPEC 1.008 06/13/2014 2201   PHURINE 5.0 02/22/2023 0745   GLUCOSEU >=500 (A) 02/22/2023 0745   GLUCOSEU 150 mg/dL 16/03/9603 5409   HGBUR MODERATE (A) 02/22/2023 0745   BILIRUBINUR  NEGATIVE 02/22/2023 0745   BILIRUBINUR Negative 06/13/2014 2201   KETONESUR NEGATIVE 02/22/2023 0745   PROTEINUR 30 (A) 02/22/2023 0745  NITRITE NEGATIVE 02/22/2023 0745   LEUKOCYTESUR LARGE (A) 02/22/2023 0745   LEUKOCYTESUR Trace 06/13/2014 2201    Radiological Exams on Admission: DG Chest 2 View Result Date: 07/30/2023 CLINICAL DATA:  Shortness of breath EXAM: CHEST - 2 VIEW COMPARISON:  X-ray 02/22/2023 and older. FINDINGS: Normal cardiopericardial silhouette with calcified aorta. There is fullness of the hila bilaterally which could be enlarged pulmonary arteries. No pneumothorax, effusion or edema. Film is under penetrated. There is compression deformity of midthoracic spine vertebral level with some augmentation cement, unchanged. Sclerotic lesion along the left proximal humerus is again noted and could be a chondroid lesion. IMPRESSION: Enlarged central pulmonary arteries as on prior. No consolidation or effusion. Stable compression deformity of a midthoracic spine vertebral level with augmentation cement Electronically Signed   By: Karen Kays M.D.   On: 07/30/2023 17:17     Data Reviewed: Relevant notes from primary care and specialist visits, past discharge summaries as available in EHR, including Care Everywhere. Prior diagnostic testing as pertinent to current admission diagnoses Updated medications and problem lists for reconciliation ED course, including vitals, labs, imaging, treatment and response to treatment Triage notes, nursing and pharmacy notes and ED provider's notes Notable results as noted in HPI   Assessment and Plan: Acute on chronic systolic CHF (congestive heart failure) (HCC) Dilated cardiomyopathy (HFrEF with improved EF) Acute on chronic respiratory failure with hypoxia Borderline hypotension IV Lasix with hold parameters Follow-up Co. oximetry to evaluate for cardiogenic hypoperfusion/circulatory shock Echocardiogram Daily weights with intake and  output monitoring Holding valsartan due to low blood pressures Continue high flow oxygen and wean as tolerated. Cardiology consult to follow  Peripheral cyanosis Improved with heated high flow ABG with improvement in pO2 from 66 on 5 L to 88 on heated high flow at 41  Hypertension Holding antihypertensives due to soft blood pressures  Chronic atrial fibrillation (HCC) Continue apixaban Holding rate control agents due to soft blood pressures  Acute renal failure superimposed on stage 3a chronic kidney disease (HCC) Might expect worsening with Lasix treatment Continue to monitor and avoid nephrotoxins  COPD (chronic obstructive pulmonary disease) (HCC) Not acutely exacerbated Continue home inhalers DuoNebs as needed  Acquired hypothyroidism Continue levothyroxine  History of CVA (cerebrovascular accident) Continue apixaban  Recurrent major depressive disorder, in partial remission (HCC) Continue sertraline  Type 2 diabetes mellitus without complications (HCC) Sliding scale insulin coverage        DVT prophylaxis: Apixaban  Consults: cardiology  Advance Care Planning:   Code Status: Limited: Do not attempt resuscitation (DNR) -DNR-LIMITED -Do Not Intubate/DNI    Family Communication: Daughter at bedside  Disposition Plan: Back to previous home environment  Severity of Illness: The appropriate patient status for this patient is INPATIENT. Inpatient status is judged to be reasonable and necessary in order to provide the required intensity of service to ensure the patient's safety. The patient's presenting symptoms, physical exam findings, and initial radiographic and laboratory data in the context of their chronic comorbidities is felt to place them at high risk for further clinical deterioration. Furthermore, it is not anticipated that the patient will be medically stable for discharge from the hospital within 2 midnights of admission.   * I certify that at the  point of admission it is my clinical judgment that the patient will require inpatient hospital care spanning beyond 2 midnights from the point of admission due to high intensity of service, high risk for further deterioration and high frequency of surveillance required.*  Author:  Andris Baumann, MD 07/30/2023 10:22 PM  For on call review [email or web address].

## 2023-07-30 NOTE — Assessment & Plan Note (Signed)
Continue apixaban

## 2023-07-30 NOTE — Assessment & Plan Note (Signed)
Might expect worsening with Lasix treatment Continue to monitor and avoid nephrotoxins

## 2023-07-30 NOTE — Assessment & Plan Note (Signed)
Sliding scale insulin coverage

## 2023-07-30 NOTE — Assessment & Plan Note (Signed)
Not acutely exacerbated Continue home inhalers.  DuoNebs as needed

## 2023-07-30 NOTE — Assessment & Plan Note (Signed)
Continue apixaban Holding rate control agents due to soft blood pressures

## 2023-07-30 NOTE — Assessment & Plan Note (Signed)
Improved with heated high flow ABG with improvement in pO2 from 66 on 5 L to 88 on heated high flow at 41

## 2023-07-30 NOTE — Assessment & Plan Note (Addendum)
Dilated cardiomyopathy (HFrEF with improved EF) Acute on chronic respiratory failure with hypoxia Borderline hypotension IV Lasix with hold parameters Follow-up Co. oximetry to evaluate for cardiogenic hypoperfusion/circulatory shock Echocardiogram Daily weights with intake and output monitoring Holding valsartan due to low blood pressures Continue high flow oxygen and wean as tolerated. Cardiology consult to follow

## 2023-07-30 NOTE — Progress Notes (Signed)
Pt transported to [facility] ED for further eval, daughter present

## 2023-07-30 NOTE — Assessment & Plan Note (Signed)
Holding antihypertensives due to soft blood pressures

## 2023-07-30 NOTE — ED Notes (Signed)
RT at bedside.

## 2023-07-30 NOTE — Assessment & Plan Note (Signed)
Continue sertraline

## 2023-07-30 NOTE — Assessment & Plan Note (Signed)
Continue levothyroxine

## 2023-07-30 NOTE — Progress Notes (Signed)
[facility]  PCP: Earnestine Mealing, MD (last seen 10/24) Primary Cardiologist: Marcina Millard, MD (last seen 12/24)  Chief Complaint: shortness of breath   HPI:   Jacqueline [name] is a 89 y/o female with a history of NSTEMI 11/2021, T2DM, paroxysmal atrial fibrillation, CVA 11/2021, hyperlipidemia, HTN, CKD, thyroid disease, anemia, GERD, hyperkalemia, hypothyroidism 03/2017, osteoporosis and chronic heart failure.  Admitted 11/18/21 due to acute on chronic hypoxic respiratory failure initially requiring biPAP due to actue on chronic HF. Initially given IV lasix with transition to oral diuretics. Cardiology consult obtained.  PT/OT evaluations done. Discharged after 7 days to SNF. Admitted 11/26/21 due to lethargy, slurred speech diaphoresis with left shoulder pain radiating to the arm and back pain.  MRI brain was obtained and showed acute right cerebellar infarct. Troponin elevated. Neurology, cardiology and palliative care consults obtained. Diagnosed with NSTEMI but heparin deferred due to stroke. Found to have new onset AF. 6/15: repeated head CT given recurrent episode on minimal responsiveness, worsened speech and diaphoresis, no hemorrhagic conversion. Discharged after 3 days.   Admitted 02/22/23 due to cough and SOB, O2 sats in the 70s. She was previously diagnosed with COVID on 8/22 and treated with Paxlovid for 5 days. She was diagnosed with post-viral bacterial infection and treated with Ceftriaxone and Azithromycin. Chest x-ray negative for pneumonia. 1 dose of IV lasix given. Trop elevation 49>53, pattern is flat, likely demanding ischemia from hypoxia. Discharged to rehab at [facility].    Echo 06/02/17: EF 30-35% with mild LVH, mild AR, mild/ moderate MR/TR, mild LAE Echo 01/05/19: EF 55-60% with mild LAE Echo 11/18/21: EF 20-25% with severe LVH, Grade II DD, mild LAE, mild MR Echo 11/27/21: EF 40-45% along with mild LVH and mild MR.  Echo 03/06/23: EF 50% (@ [place])  Jacqueline.  Dewan lives at [facility]. She was brought here by her daughter to see Jacqueline. [facility] but she was not in the office today so I was asked to see her. Jacqueline. Armato is not able to provide me much history. In December apparently her torsemide dose got dropped from 40 mg every other day/20 every other day to 20 daily inadvertently. Since that time she has been much more SOB. Hands have been cyanotic and unable to get pulse ok. Finally got it with ear probe today and was 81%  + thick white mucus. Less interactive. No Chest pain   ROS: All systems negative except as listed in HPI, PMH and Problem List.  SH:  Social History   Socioeconomic History   Marital status: Widowed    Spouse name: Not on file   Number of children: Not on file   Years of education: Not on file   Highest education level: Not on file  Occupational History   Not on file  Tobacco Use   Smoking status: Former    Current packs/day: 0.00    Types: Cigarettes    Quit date: 04/18/1984    Years since quitting: 39.3   Smokeless tobacco: Never  Vaping Use   Vaping status: Former   Start date: 05/16/1965   Quit date: 05/16/1989  Substance and Sexual Activity   Alcohol use: No   Drug use: No   Sexual activity: Never  Other Topics Concern   Not on file  Social History Narrative   Not on file   Social Drivers of Health   Financial Resource Strain: Not on file  Food Insecurity: No Food Insecurity (02/23/2023)   Hunger Vital  Sign    Worried About Programme researcher, broadcasting/film/video in the Last Year: Never true    Ran Out of Food in the Last Year: Never true  Transportation Needs: No Transportation Needs (02/23/2023)   PRAPARE - Administrator, Civil Service (Medical): No    Lack of Transportation (Non-Medical): No  Physical Activity: Not on file  Stress: Not on file  Social Connections: Not on file  Intimate Partner Violence: Not At Risk (02/23/2023)   Humiliation, Afraid, Rape, and Kick questionnaire    Fear of Current or Ex-Partner:  No    Emotionally Abused: No    Physically Abused: No    Sexually Abused: No    FH:  Family History  Problem Relation Age of Onset   Premature CHD Brother     Past Medical History:  Diagnosis Date   Acute exacerbation of CHF (congestive heart failure) (HCC) 11/18/2021   AKI (acute kidney injury) (HCC) 11/26/2021   Anemia    CHF (congestive heart failure) (HCC)    Diabetes mellitus without complication (HCC) 03/13/2017   Currently no high BS.  Has come off meds.  But having episodes of low BS now.   Dilated idiopathic cardiomyopathy (HCC)    Dizziness 02/18/2017   GERD (gastroesophageal reflux disease)    Hyperkalemia 11/26/2021   Hyperlipidemia    Hypertension    Hyponatremia 06/09/2017   Hypothyroidism    Osteoporosis    Renal disorder    stage 3    Current Outpatient Medications  Medication Sig Dispense Refill   acetaminophen (TYLENOL) 325 MG tablet Take 2 tablets (650 mg total) by mouth every 4 (four) hours as needed for headache or mild pain.     alendronate (FOSAMAX) 70 MG tablet Take 70 mg by mouth once a week. Take with a full glass of water on an empty stomach.     apixaban (ELIQUIS) 2.5 MG TABS tablet Take 1 tablet (2.5 mg total) by mouth 2 (two) times daily. Start on 11/30/2021 60 tablet    arformoterol (BROVANA) 15 MCG/2ML NEBU Take 2 mLs (15 mcg total) by nebulization in the morning and at bedtime. 120 mL 6   ARTIFICIAL TEAR SOLUTION OP Apply 1 drop to eye 2 (two) times daily. Both Eyes.     atorvastatin (LIPITOR) 80 MG tablet Take 1 tablet (80 mg total) by mouth daily.     budesonide (PULMICORT) 0.25 MG/2ML nebulizer solution Take 2 mLs (0.25 mg total) by nebulization in the morning and at bedtime. 60 mL 12   Cholecalciferol (D3-1000) 25 MCG (1000 UT) capsule Take 1,000 Units by mouth daily.     Dextromethorphan-guaiFENesin 20-400 MG TABS Take 1 tablet by mouth 2 (two) times daily.     diclofenac Sodium (VOLTAREN) 1 % GEL Apply 2 g topically daily as needed  (pain). Apply to back topically every day shift for pain.     ferrous sulfate 325 (65 FE) MG EC tablet Take 325 mg by mouth. Every Monday, Wednesday and Friday.     hydrocortisone (ANUSOL-HC) 25 MG suppository Place 25 mg rectally 2 (two) times daily as needed for hemorrhoids or anal itching.     JARDIANCE 10 MG TABS tablet Take 10 mg by mouth daily.     levothyroxine (SYNTHROID) 50 MCG tablet Take 50 mcg by mouth daily before breakfast.     Nutritional Supplements (,FEEDING SUPPLEMENT, PROSOURCE PLUS) liquid Take 30 mLs by mouth daily.     Nutritional Supplements (ENSURE ENLIVE PO) Take 1  Can by mouth as directed. After meals if she eats less than 50 % due to poor appetite and weight loss s/p covid     OXYGEN 4lpm via [place] for SOB continuous.     pantoprazole (PROTONIX) 40 MG tablet Take 40 mg by mouth in the morning and at bedtime.     polyethylene glycol (MIRALAX / GLYCOLAX) 17 g packet Take 17 g by mouth every other day.     potassium chloride (KLOR-CON) 20 MEQ packet Take 20 mEq by mouth daily.     senna-docusate (SENOKOT-S) 8.6-50 MG tablet Take 2 tablets by mouth daily.     sertraline (ZOLOFT) 50 MG tablet Take 50 mg by mouth daily.     torsemide (DEMADEX) 20 MG tablet Take 20 mg by mouth daily.     valsartan (DIOVAN) 80 MG tablet Take 80 mg by mouth at bedtime.     No current facility-administered medications for this visit.   Vitals:   07/30/23 1423  BP: (!) 87/44  Pulse: 61   Wt Readings from Last 3 Encounters:  07/29/23 114 lb 12.8 oz (52.1 kg)  06/16/23 113 lb 12.8 oz (51.6 kg)  06/15/23 113 lb 12.8 oz (51.6 kg)   Lab Results  Component Value Date   CREATININE 1.3 (A) 06/29/2023   CREATININE 1.2 (A) 05/18/2023   CREATININE 1.2 (A) 04/09/2023    PHYSICAL EXAM:  General:  Elderly ill appearing woman in WC on O2 HEENT: normal Neck: supple. JVP to jaw.  Cor: Irreg 2/6 SEM RUSB Lungs: crackles at bases  Abdomen: soft, nontender, + distended. No hepatosplenomegaly. No  bruits or masses. Good bowel sounds. Extremities: + cyanosis in both hands 2+ edema into thighs  Neuro: lethargic weak. Moves all 4   ASSESSMENT & PLAN:  1. Acute hypoxic respiratory failure - we are unable to get pulse-ox in clinic today due to severe peripheral cyanosis - ear probe from [facility] suggests 81% - suspect primarily HF but may be mixed picture - will send to ED for further evaluation - May be end-stage - Needs ABG  2. Acute on chronic HF with mildly decreased EF - volume up today in setting of recent torsemide decrease - Echo 06/02/17: EF 30-35% with mild LVH, mild AR, mild/ moderate MR/TR, mild LAE - Echo 01/05/19: EF 55-60% with mild LAE - Echo 11/18/21: EF 20-25% with severe LVH, Grade II DD, mild LAE, mild MR - Echo 11/27/21: EF 40-45% along with mild LVH and mild MR.  - Echo 03/06/23: EF 50% (@ [facility]) - continue jardiance 10mg  daily - continue valsartan 80mg  daily but move this to bedtime due to low BP - has allergy to spironolactone & entresto (hypotension) - As above, volume overloaded. Send to ED for IV diuresis - On d/c consider torsemide 40 every other day alternating with 20 every other day  3. HTN- - BP low in clinic today - sending to ER  4: DM- - A1c 05/18/23 was 6.4%  5: PAF- - saw cardiology ([facility]) 12/24 - continue apixaban 2.5mg  BID - seems to be back in AF today  Arvilla Meres, MD  4:16 PM

## 2023-07-30 NOTE — ED Triage Notes (Addendum)
Pt sent from heart failure clinic at [facility].  Pt has sob and is on 5 liters oxygen [place].   No chest pain.   Swelling to both legs/feet.   No n/v/  pt brought by daughter.  Pt is resident of [facility].  Cyanosis noted of fingers/nail beds.  Unable to get pulse ox reading in triage.  Pt alert in wheelchair.

## 2023-07-30 NOTE — ED Notes (Signed)
RT called about ABG

## 2023-07-30 NOTE — ED Provider Triage Note (Signed)
Emergency Medicine Provider Triage Evaluation Note  BRIGETTE HOPFER , a 89 y.o. female  was evaluated in triage.  Pt complains of possible fluid overload. Sent by CHF clinic due to cyanosis of the finger tips bilaterally and lips.   Review of Systems  Positive: SOB, bilateral leg swelling  Negative:   Physical Exam  BP (!) 103/56 (BP Location: Left Arm)   Temp 97.7 F (36.5 C) (Oral)   Resp (!) 28   Ht 4\' 9"  (1.448 m)   Wt 51.7 kg   BMI 24.67 kg/m  Gen:   Awake, no distress   Resp:  Normal effort  MSK:   Moves extremities without difficulty  Other:  >3 sec capillary refill. No pitting edema of LE bilateral   Medical Decision Making  Medically screening exam initiated at 3:48 PM.  Appropriate orders placed.  Oval Linsey was informed that the remainder of the evaluation will be completed by another provider, this initial triage assessment does not replace that evaluation, and the importance of remaining in the ED until their evaluation is complete.    Romeo Apple, Colvin Blatt A, PA-C 07/30/23 1550

## 2023-07-30 NOTE — ED Provider Notes (Signed)
[facility] Provider Note    Event Date/Time   First MD Initiated Contact with Patient 07/30/23 1703     (approximate)   History   Shortness of Breath   HPI  Jacqueline [name] is a 89 y.o. female  NSTEMI 11/2021, T2DM, paroxysmal atrial fibrillation, CVA 11/2021, hyperlipidemia, HTN, CKD, thyroid disease, anemia, GERD, hyperkalemia, hypothyroidism 03/2017, osteoporosis and chronic heart failure.   I reviewed the note from cardiology today where patient had increasing shortness of breath and her torsemide was dropped from 40 mg to 20 mg every other day and then to 20 daily.  They have noticed some cyanosis of her fingertips.    Patient is on Eliquis.  According to family over the past week and a half they noticed increasing shortness of breath and increasing swelling.  Denies any known falls.  Denies any abdominal pain.  Physical Exam   Triage Vital Signs: ED Triage Vitals  Encounter Vitals Group     BP 07/30/23 1544 (!) 103/56     Systolic BP Percentile --      Diastolic BP Percentile --      Pulse --      Resp 07/30/23 1544 (!) 28     Temp 07/30/23 1544 97.7 F (36.5 C)     Temp Source 07/30/23 1544 Oral     SpO2 --      Weight 07/30/23 1542 114 lb (51.7 kg)     Height 07/30/23 1542 4\' 9"  (1.448 m)     Head Circumference --      Peak Flow --      Pain Score 07/30/23 1542 0     Pain Loc --      Pain Education --      Exclude from Growth Chart --     Most recent vital signs: Vitals:   07/30/23 1544  BP: (!) 103/56  Resp: (!) 28  Temp: 97.7 F (36.5 C)     General: Awake, no distress.  CV:  Good peripheral perfusion.  Resp:  Normal effort.  No wheezing Abd:  No distention.  Soft and nontender Other:  Swelling noted on bilateral legs   ED Results / Procedures / Treatments   Labs (all labs ordered are listed, but only abnormal results are displayed) Labs Reviewed  BASIC METABOLIC PANEL - Abnormal; Notable for the following  components:      Result Value   CO2 17 (*)    Glucose, Bld 109 (*)    BUN 52 (*)    Creatinine, Ser 1.86 (*)    Calcium 8.8 (*)    GFR, Estimated 26 (*)    All other components within normal limits  CBC - Abnormal; Notable for the following components:   WBC 11.8 (*)    RBC 5.85 (*)    HCT 49.7 (*)    MCH 25.5 (*)    RDW 22.4 (*)    All other components within normal limits  PROTIME-INR - Abnormal; Notable for the following components:   Prothrombin Time 17.4 (*)    INR 1.4 (*)    All other components within normal limits  TROPONIN I (HIGH SENSITIVITY) - Abnormal; Notable for the following components:   Troponin I (High Sensitivity) 35 (*)    All other components within normal limits  BRAIN NATRIURETIC PEPTIDE     EKG  My interpretation of EKG:  Sinus rhythm 87 without any ST elevation or T wave inversions but there is a lot  of artifact therefore I will have them repeated  RADIOLOGY I have reviewed the xray personally and interpreted and no evidence of obvious consolidation PROCEDURES:  Critical Care performed: Yes, see critical care procedure note(s)  .Critical Care  Performed by: Concha Se, MD Authorized by: Concha Se, MD   Critical care provider statement:    Critical care time (minutes):  30   Critical care was necessary to treat or prevent imminent or life-threatening deterioration of the following conditions:  Respiratory failure   Critical care was time spent personally by me on the following activities:  Development of treatment plan with patient or surrogate, discussions with consultants, evaluation of patient's response to treatment, examination of patient, ordering and review of laboratory studies, ordering and review of radiographic studies, ordering and performing treatments and interventions, pulse oximetry, re-evaluation of patient's condition and review of old charts .1-3 Lead EKG Interpretation  Performed by: Concha Se, MD Authorized by:  Concha Se, MD     Interpretation: normal     ECG rate:  80   ECG rate assessment: normal     Rhythm: sinus rhythm     Ectopy: none     Conduction: normal      MEDICATIONS ORDERED IN ED: Medications  furosemide (LASIX) injection 40 mg (40 mg Intravenous Given 07/30/23 1851)     IMPRESSION / MDM / ASSESSMENT AND PLAN / ED COURSE  I reviewed the triage vital signs and the nursing notes.   Patient's presentation is most consistent with acute presentation with potential threat to life or bodily function.   Patient comes in with concerns for difficulty getting pulse ox at home and purplish discolored fingers.  Differential includes ACS, COVID, flu, CHF.  Doubt PE given patient is on Eliquis.  Patient is a ear probe was placed and saturations were 88 to 92% so I increased to 6 L.  Troponins are flat.  PaO2 showed 66 this was 1 patient was on the 5 L.  I did discuss with the respiratory therapist given she does still have some purplish discoloration in her fingers to place patient on high flow nasal cannula.  Her BMP shows slightly elevated creatinine and her BNP is elevated so I suspect this is related to CHF especially given she is been taking less of her Lasix and she is supposed to be.  Therefore I will give 40 of IV Lasix.  Given the increasing oxygen demand and need for diuresis I will discuss with the hospital team for admission  I did notify Dr. Juliann Pares who has come down to see patient.   The patient is on the cardiac monitor to evaluate for evidence of arrhythmia and/or significant heart rate changes.   FINAL CLINICAL IMPRESSION(S) / ED DIAGNOSES   Final diagnoses:  Acute respiratory failure with hypoxia (HCC)  Acute on chronic congestive heart failure, unspecified heart failure type (HCC)     Rx / DC Orders   ED Discharge Orders     None        Note:  This document was prepared using Dragon voice recognition software and may include unintentional dictation  errors.   Concha Se, MD 07/30/23 1900

## 2023-07-31 ENCOUNTER — Inpatient Hospital Stay
Admit: 2023-07-31 | Discharge: 2023-07-31 | Disposition: A | Discharge status: Home / Self Care | Payer: Medicare Other | Attending: Internal Medicine | Admitting: Internal Medicine

## 2023-07-31 DIAGNOSIS — I5023 Acute on chronic systolic (congestive) heart failure: Secondary | ICD-10-CM | POA: Diagnosis not present

## 2023-07-31 DIAGNOSIS — J9621 Acute and chronic respiratory failure with hypoxia: Secondary | ICD-10-CM | POA: Diagnosis not present

## 2023-07-31 DIAGNOSIS — I5033 Acute on chronic diastolic (congestive) heart failure: Secondary | ICD-10-CM | POA: Diagnosis not present

## 2023-07-31 LAB — PROCALCITONIN: Procalcitonin: <0.1 ng/mL

## 2023-07-31 LAB — URINALYSIS, COMPLETE (UACMP) WITH MICROSCOPIC
Bilirubin Urine: NEGATIVE
Glucose, UA: 50 mg/dL — AB
Ketones, ur: NEGATIVE mg/dL
Nitrite: NEGATIVE
Protein, ur: NEGATIVE mg/dL
Specific Gravity, Urine: 1.009 (ref 1.005–1.030)
Squamous Epithelial / HPF: 0 /[HPF] (ref 0–5)
WBC, UA: >50 WBC/hpf (ref 0–5)
pH: 5 (ref 5.0–8.0)

## 2023-07-31 LAB — ECHOCARDIOGRAM COMPLETE
AR max vel: 1.95 cm2
AV Area VTI: 1.85 cm2
AV Area mean vel: 1.8 cm2
AV Mean grad: 3 mm[Hg]
AV Peak grad: 5.8 mm[Hg]
Ao pk vel: 1.21 m/s
Area-P 1/2: 3.72 cm2
Height: 57 in
MV VTI: 1.68 cm2
S' Lateral: 3.3 cm
Weight: 1824 [oz_av]

## 2023-07-31 LAB — LACTIC ACID, PLASMA: Lactic Acid, Venous: 1.2 mmol/L (ref 0.5–1.9)

## 2023-07-31 LAB — BASIC METABOLIC PANEL
Anion gap: 9 (ref 5–15)
BUN: 52 mg/dL — ABNORMAL HIGH (ref 8–23)
CO2: 20 mmol/L — ABNORMAL LOW (ref 22–32)
Calcium: 8.4 mg/dL — ABNORMAL LOW (ref 8.9–10.3)
Chloride: 109 mmol/L (ref 98–111)
Creatinine, Ser: 1.79 mg/dL — ABNORMAL HIGH (ref 0.44–1.00)
GFR, Estimated: 27 mL/min — ABNORMAL LOW (ref >60)
Glucose, Bld: 105 mg/dL — ABNORMAL HIGH (ref 70–99)
Potassium: 4.3 mmol/L (ref 3.5–5.1)
Sodium: 138 mmol/L (ref 135–145)

## 2023-07-31 LAB — CBC
HCT: 40.2 % (ref 36.0–46.0)
Hemoglobin: 12.1 g/dL (ref 12.0–15.0)
MCH: 24.5 pg — ABNORMAL LOW (ref 26.0–34.0)
MCHC: 30.1 g/dL (ref 30.0–36.0)
MCV: 81.5 fL (ref 80.0–100.0)
Platelets: 160 10*3/uL (ref 150–400)
RBC: 4.93 MIL/uL (ref 3.87–5.11)
RDW: 21.7 % — ABNORMAL HIGH (ref 11.5–15.5)
WBC: 6.2 10*3/uL (ref 4.0–10.5)
nRBC: 0 % (ref 0.0–0.2)

## 2023-07-31 LAB — CBG MONITORING, ED
Glucose-Capillary: 105 mg/dL — ABNORMAL HIGH (ref 70–99)
Glucose-Capillary: 111 mg/dL — ABNORMAL HIGH (ref 70–99)
Glucose-Capillary: 110 mg/dL — ABNORMAL HIGH (ref 70–99)

## 2023-07-31 LAB — GLUCOSE, CAPILLARY: Glucose-Capillary: 127 mg/dL — ABNORMAL HIGH (ref 70–99)

## 2023-07-31 MED ORDER — FUROSEMIDE 10 MG/ML IJ SOLN
80.0000 mg | Freq: Two times a day (BID) | INTRAMUSCULAR | Status: DC
Start: 2023-07-31 — End: 2023-08-01
  Filled 2023-07-31: qty 8

## 2023-07-31 NOTE — Progress Notes (Signed)
CSW spoke with [name] at [facility] who states patient is LTC at the facility and can return to the facility once medically stable for discharge.  Edwin Dada, MSW, LCSW Transitions of Care  Clinical Social Worker II [phone]

## 2023-07-31 NOTE — Progress Notes (Addendum)
Progress Note    Jacqueline [name]  ZOX:[id number] DOB: 14-Jun-1933  DOA: 07/30/2023 PCP: Earnestine Mealing, MD      Brief Narrative:    Medical records reviewed and are as summarized below:  Jacqueline [name] is a 89 y.o. female  with medical history significant for NSTEMI 11/2021, T2DM, paroxysmal atrial fibrillation on Eliquis, CVA 11/2021, HTN, hypothyroidism, anemia, COPD on home O2 at 4 L, HFrEF with improved EF secondary to NICM, last EF 50% in September 2024 (previously 20 to 25% 2023).  She was referred from the cardiologist office to the emergency department because of concerns of CHF exacerbation.  Her daughter reported that patient was found to have bluish discoloration of her fingers up to her knuckles and increasing swelling of her lower extremities.  She had also become increasingly short of breath.      Assessment/Plan:   Active Problems:   Acute on chronic heart failure with preserved ejection fraction (HFpEF) (HCC)   Dilated idiopathic cardiomyopathy (HCC)   Acute on chronic respiratory failure with hypoxia (HCC)   Hypertension   Peripheral cyanosis   Hypotension   Chronic atrial fibrillation (HCC)   Acute renal failure superimposed on stage 3a chronic kidney disease (HCC)   COPD (chronic obstructive pulmonary disease) (HCC)   Acquired hypothyroidism   Type 2 diabetes mellitus without complications (HCC)   Recurrent major depressive disorder, in partial remission (HCC)   History of CVA (cerebrovascular accident)    Body mass index is 24.67 kg/m.   Acute on chronic HFpEF:  Continue IV Lasix.  BMP, daily weight and urine output.  2D echo in September 2024 showed EF estimated at 50%. 2D echo is pending   Acute on chronic hypoxemic respiratory failure: She is on oxygen via heated humidified high flow nasal cannula (FiO2 40% at 40 L/min).  Wean down oxygen as able.  She uses 4 L oxygen at baseline.    AKI on CKD stage IIIa: This probably from cardiorenal  syndrome.  Monitor BMP.   Paroxysmal atrial fibrillation/atrial flutter, history of stroke: Continue Eliquis   Type 2 diabetes mellitus: Continue correctional scale NovoLog.   Comorbidities include hypothyroidism, recurrent major depression, hypertension, cognitive decline, ? dementia     Diet Order             Diet heart healthy/carb modified Room service appropriate? Yes; Fluid consistency: Thin  Diet effective now                            Consultants: None  Procedures: None    Medications:    apixaban  2.5 mg Oral BID   arformoterol  15 mcg Nebulization BID   atorvastatin  80 mg Oral Daily   budesonide  0.25 mg Nebulization BID   empagliflozin  10 mg Oral Daily   furosemide  40 mg Intravenous Q12H   insulin aspart  0-15 Units Subcutaneous TID WC   insulin aspart  0-5 Units Subcutaneous QHS   irbesartan  75 mg Oral Daily   levothyroxine  50 mcg Oral QAC breakfast   pantoprazole  40 mg Oral BID   potassium chloride  20 mEq Oral Daily   senna-docusate  2 tablet Oral Daily   sertraline  50 mg Oral Daily   Continuous Infusions:   Anti-infectives (From admission, onward)    None              Family Communication/Anticipated D/C date  and plan/Code Status   DVT prophylaxis: apixaban (ELIQUIS) tablet 2.5 mg Start: 07/30/23 2330 apixaban (ELIQUIS) tablet 2.5 mg     Code Status: Limited: Do not attempt resuscitation (DNR) -DNR-LIMITED -Do Not Intubate/DNI   Family Communication: Plan discussed with [name], daughter, at the bedside Disposition Plan: Plan to discharge to SNF   Status is: Inpatient Remains inpatient appropriate because: CHF exacerbation       Subjective:   Interval events noted.  She has no complaints.  No shortness of breath.  Speech is soft and difficult to understand.  [name], daughter, was at the bedside  Objective:    Vitals:   07/31/23 0827 07/31/23 0829 07/31/23 0915 07/31/23 1130  BP:  (!) 129/103   111/61  Pulse: 84 84  86  Resp: 20 20  20   Temp:   97.9 F (36.6 C)   TempSrc:   Oral   SpO2: 99% 99%  98%  Weight:      Height:       No data found.  No intake or output data in the 24 hours ending 07/31/23 1331 Filed Weights   07/30/23 1542  Weight: 51.7 kg    Exam:  GEN: NAD, on oxygen via heated humidified high flow nasal cannula SKIN: Warm and dry EYES: No pallor or icterus ENT: MMM CV: RRR PULM: Bibasilar rales, no wheezing ABD: soft, ND, NT, +BS CNS: Alert, speech is soft, slow and difficult to comprehend EXT: B/l thigh, leg and pedal edema, no erythema or tenderness      Data Reviewed:   I have personally reviewed following labs and imaging studies:  Labs: Labs show the following:   Basic Metabolic Panel: Recent Labs  Lab 07/30/23 1546 07/31/23 0520  NA 138 138  K 5.1 4.3  CL 110 109  CO2 17* 20*  GLUCOSE 109* 105*  BUN 52* 52*  CREATININE 1.86* 1.79*  CALCIUM 8.8* 8.4*   GFR Estimated Creatinine Clearance: 15 mL/min (A) (by C-G formula based on SCr of 1.79 mg/dL (H)). Liver Function Tests: No results for input(s): "AST", "ALT", "ALKPHOS", "BILITOT", "PROT", "ALBUMIN" in the last 168 hours. No results for input(s): "LIPASE", "AMYLASE" in the last 168 hours. No results for input(s): "AMMONIA" in the last 168 hours. Coagulation profile Recent Labs  Lab 07/30/23 1546  INR 1.4*    CBC: Recent Labs  Lab 07/30/23 1546 07/31/23 0520  WBC 11.8* 6.2  HGB 14.9 12.1  HCT 49.7* 40.2  MCV 85.0 81.5  PLT 178 160   Cardiac Enzymes: No results for input(s): "CKTOTAL", "CKMB", "CKMBINDEX", "TROPONINI" in the last 168 hours. BNP (last 3 results) No results for input(s): "PROBNP" in the last 8760 hours. CBG: Recent Labs  Lab 07/30/23 2108 07/31/23 0911 07/31/23 1134  GLUCAP 105* 105* 111*   D-Dimer: No results for input(s): "DDIMER" in the last 72 hours. Hgb A1c: No results for input(s): "HGBA1C" in the last 72 hours. Lipid  Profile: No results for input(s): "CHOL", "HDL", "LDLCALC", "TRIG", "CHOLHDL", "LDLDIRECT" in the last 72 hours. Thyroid function studies: No results for input(s): "TSH", "T4TOTAL", "T3FREE", "THYROIDAB" in the last 72 hours.  Invalid input(s): "FREET3" Anemia work up: No results for input(s): "VITAMINB12", "FOLATE", "FERRITIN", "TIBC", "IRON", "RETICCTPCT" in the last 72 hours. Sepsis Labs: Recent Labs  Lab 07/30/23 1546 07/30/23 2352 07/31/23 0520  PROCALCITON  --  <0.10  --   WBC 11.8*  --  6.2  LATICACIDVEN  --  1.2  --     Microbiology Recent  Results (from the past 240 hours)  Resp panel by RT-PCR (RSV, Flu A&B, Covid) Anterior Nasal Swab     Status: None   Collection Time: 07/30/23  6:07 PM   Specimen: Anterior Nasal Swab  Result Value Ref Range Status   SARS Coronavirus 2 by RT PCR NEGATIVE NEGATIVE Final    Comment: (NOTE) SARS-CoV-2 target nucleic acids are NOT DETECTED.  The SARS-CoV-2 RNA is generally detectable in upper respiratory specimens during the acute phase of infection. The lowest concentration of SARS-CoV-2 viral copies this assay can detect is 138 copies/mL. A negative result does not preclude SARS-Cov-2 infection and should not be used as the sole basis for treatment or other patient management decisions. A negative result may occur with  improper specimen collection/handling, submission of specimen other than nasopharyngeal swab, presence of viral mutation(s) within the areas targeted by this assay, and inadequate number of viral copies(<138 copies/mL). A negative result must be combined with clinical observations, patient history, and epidemiological information. The expected result is Negative.  Fact Sheet for Patients:  BloggerCourse.com  Fact Sheet for Healthcare Providers:  SeriousBroker.it  This test is no t yet approved or cleared by the [place] FDA and  has been authorized for  detection and/or diagnosis of SARS-CoV-2 by FDA under an Emergency Use Authorization (EUA). This EUA will remain  in effect (meaning this test can be used) for the duration of the COVID-19 declaration under Section 564(b)(1) of the Act, 21 U.S.C.section 360bbb-3(b)(1), unless the authorization is terminated  or revoked sooner.       Influenza A by PCR NEGATIVE NEGATIVE Final   Influenza B by PCR NEGATIVE NEGATIVE Final    Comment: (NOTE) The Xpert Xpress SARS-CoV-2/FLU/RSV plus assay is intended as an aid in the diagnosis of influenza from Nasopharyngeal swab specimens and should not be used as a sole basis for treatment. Nasal washings and aspirates are unacceptable for Xpert Xpress SARS-CoV-2/FLU/RSV testing.  Fact Sheet for Patients: BloggerCourse.com  Fact Sheet for Healthcare Providers: SeriousBroker.it  This test is not yet approved or cleared by the [place] FDA and has been authorized for detection and/or diagnosis of SARS-CoV-2 by FDA under an Emergency Use Authorization (EUA). This EUA will remain in effect (meaning this test can be used) for the duration of the COVID-19 declaration under Section 564(b)(1) of the Act, 21 U.S.C. section 360bbb-3(b)(1), unless the authorization is terminated or revoked.     Resp Syncytial Virus by PCR NEGATIVE NEGATIVE Final    Comment: (NOTE) Fact Sheet for Patients: BloggerCourse.com  Fact Sheet for Healthcare Providers: SeriousBroker.it  This test is not yet approved or cleared by the [place] FDA and has been authorized for detection and/or diagnosis of SARS-CoV-2 by FDA under an Emergency Use Authorization (EUA). This EUA will remain in effect (meaning this test can be used) for the duration of the COVID-19 declaration under Section 564(b)(1) of the Act, 21 U.S.C. section 360bbb-3(b)(1), unless the authorization is  terminated or revoked.  Performed at [facility], [address] [place], [place] [address]     Procedures and diagnostic studies:  DG Chest 2 View Result Date: 07/30/2023 CLINICAL DATA:  Shortness of breath EXAM: CHEST - 2 VIEW COMPARISON:  X-ray 02/22/2023 and older. FINDINGS: Normal cardiopericardial silhouette with calcified aorta. There is fullness of the hila bilaterally which could be enlarged pulmonary arteries. No pneumothorax, effusion or edema. Film is under penetrated. There is compression deformity of midthoracic spine vertebral level with some augmentation cement,  unchanged. Sclerotic lesion along the left proximal humerus is again noted and could be a chondroid lesion. IMPRESSION: Enlarged central pulmonary arteries as on prior. No consolidation or effusion. Stable compression deformity of a midthoracic spine vertebral level with augmentation cement Electronically Signed   By: Karen Kays M.D.   On: 07/30/2023 17:17               LOS: 1 day   Vega Withrow  [facility]   Pager on [email or web address]. If 7PM-7AM, please contact night-coverage at [email or web address]     07/31/2023, 1:31 PM

## 2023-07-31 NOTE — Consult Note (Signed)
Advanced Heart Failure Team Consult Note   Primary Physician: Earnestine Mealing, MD Cardiologist:  Dr. Darrold Junker  Reason for Consultation: Heart failure  HPI:    Jacqueline [name] is seen today for evaluation of heart failure at the request of Dr. Juliann Pares.   Jacqueline [name] is a 89 y/o female with a history of NSTEMI 11/2021, T2DM, paroxysmal atrial fibrillation, CVA 11/2021, hyperlipidemia, HTN, CKD, thyroid disease, anemia, GERD, hyperkalemia, hypothyroidism 03/2017, osteoporosis and chronic heart failure she has been followed by Dr. Darrold Junker with [facility] and Clarisa Kindred, NP in the HF Clinic.   Jacqueline. [name] lives at [facility]. She was brought to the HF Clinic yesterday by her daughter to see Jacqueline. [facility] but she was not in the office so I evaluated her. In the office she appeared quite lethargic. Exam also notable for significant volume overload and cyanotic digits with sats in the 80s. Per her daughter, her torsemide dose got dropped from 40 mg every other day/20 every other day to 20 daily inadvertently. I referred her to the ER.  In the ER w/u revealed. SBP 103/50   O2 sat was in the low 90s on 5 L. ABG on 5 L 7.35/30/66/93% with bicarb 16 and acid-base deficit of 7.7 She subsequently transition to heated high flow with improvement in PaO2 to 88 and improvement in cyanosis of fingers. Troponin 37 and BNP 1404. Respiratory viral panel negative for COVID flu and RSV Creatinine 1.86, slightly above baseline of 1.2 with bicarb of 17. WBC 11,800   There was some concern for overlying spesis but PCT and UA were unremarkable.   She has been treated with HFO2 and lasix 40IV bid.   She has diuresed well and today she is more interactive. Denies SOB. Cyanosis improved (on HFO2)   She is back in AF on the monitor but rate is controlled   Echo 06/02/17: EF 30-35% with mild LVH, mild AR, mild/ moderate MR/TR, mild LAE Echo 01/05/19: EF 55-60% with mild LAE Echo 11/18/21: EF 20-25% with severe LVH,  Grade II DD, mild LAE, mild MR Echo 11/27/21: EF 40-45% along with mild LVH and mild MR.  Echo 03/06/23: EF 50% (@ [place])    Home Medications Prior to Admission medications   Medication Sig Start Date End Date Taking? Authorizing Provider  acetaminophen (TYLENOL) 325 MG tablet Take 2 tablets (650 mg total) by mouth every 4 (four) hours as needed for headache or mild pain. 11/29/21  Yes Esaw Grandchild A, DO  alendronate (FOSAMAX) 70 MG tablet Take 70 mg by mouth once a week. Take with a full glass of water on an empty stomach.   Yes [provider]  apixaban (ELIQUIS) 2.5 MG TABS tablet Take 1 tablet (2.5 mg total) by mouth 2 (two) times daily. Start on 11/30/2021 11/29/21  Yes Esaw Grandchild A, DO  arformoterol (BROVANA) 15 MCG/2ML NEBU Take 2 mLs (15 mcg total) by nebulization in the morning and at bedtime. 05/06/23  Yes Assaker, [place], MD  ARTIFICIAL TEAR SOLUTION OP Apply 1 drop to eye 2 (two) times daily. Both Eyes.   Yes [provider]  atorvastatin (LIPITOR) 80 MG tablet Take 1 tablet (80 mg total) by mouth daily. 11/30/21  Yes Esaw Grandchild A, DO  budesonide (PULMICORT) 0.25 MG/2ML nebulizer solution Take 2 mLs (0.25 mg total) by nebulization in the morning and at bedtime. 05/06/23  Yes Assaker, [place], MD  Cholecalciferol (D3-1000) 25 MCG (1000 UT) capsule Take 1,000 Units by mouth  daily.   Yes [provider]  Dextromethorphan-guaiFENesin 20-400 MG TABS Take 1 tablet by mouth 2 (two) times daily.   Yes [provider]  diclofenac Sodium (VOLTAREN) 1 % GEL Apply 2 g topically daily as needed (pain). Apply to back topically every day shift for pain.   Yes [provider]  ferrous sulfate 325 (65 FE) MG EC tablet Take 325 mg by mouth. Every Monday, Wednesday and Friday.   Yes [provider]  hydrocortisone (ANUSOL-HC) 25 MG suppository Place 25 mg rectally 2 (two) times daily as needed for hemorrhoids or anal itching.    Yes [provider]  JARDIANCE 10 MG TABS tablet Take 10 mg by mouth daily. 09/23/21  Yes [provider]  levothyroxine (SYNTHROID) 50 MCG tablet Take 50 mcg by mouth daily before breakfast.   Yes [provider]  pantoprazole (PROTONIX) 40 MG tablet Take 40 mg by mouth in the morning and at bedtime.   Yes [provider]  polyethylene glycol (MIRALAX / GLYCOLAX) 17 g packet Take 17 g by mouth every other day.   Yes [provider]  potassium chloride (KLOR-CON) 20 MEQ packet Take 20 mEq by mouth daily.   Yes [provider]  senna-docusate (SENOKOT-S) 8.6-50 MG tablet Take 2 tablets by mouth daily.   Yes [provider]  sertraline (ZOLOFT) 50 MG tablet Take 50 mg by mouth daily.   Yes [provider]  torsemide (DEMADEX) 20 MG tablet Take 20-40 mg by mouth every other day.   Yes [provider]  valsartan (DIOVAN) 80 MG tablet Take 80 mg by mouth at bedtime.   Yes [provider]  Nutritional Supplements (,FEEDING SUPPLEMENT, PROSOURCE PLUS) liquid Take 30 mLs by mouth daily.    [provider]  Nutritional Supplements (ENSURE ENLIVE PO) Take 1 Can by mouth as directed. After meals if she eats less than 50 % due to poor appetite and weight loss s/p covid    [provider]  OXYGEN 4lpm via [place] for SOB continuous.    [provider]    Past Medical History: Past Medical History:  Diagnosis Date   Acute exacerbation of CHF (congestive heart failure) (HCC) 11/18/2021   AKI (acute kidney injury) (HCC) 11/26/2021   Anemia    CHF (congestive heart failure) (HCC)    Diabetes mellitus without complication (HCC) 03/13/2017   Currently no high BS.  Has come off meds.  But having episodes of low BS now.   Dilated idiopathic cardiomyopathy (HCC)    Dizziness 02/18/2017   GERD (gastroesophageal reflux disease)    Hyperkalemia 11/26/2021   Hyperlipidemia    Hypertension     Hyponatremia 06/09/2017   Hypothyroidism    Osteoporosis    Renal disorder    stage 3    Past Surgical History: Past Surgical History:  Procedure Laterality Date   BUNIONECTOMY     COLONOSCOPY     ESOPHAGOGASTRODUODENOSCOPY (EGD) WITH PROPOFOL N/A 10/15/2017   Procedure: ESOPHAGOGASTRODUODENOSCOPY (EGD) WITH PROPOFOL;  Surgeon: Christena Deem, MD;  Location: [facility] ENDOSCOPY;  Service: Endoscopy;  Laterality: N/A;   KYPHOPLASTY N/A 06/12/2017   Procedure: [id number];  Surgeon: Kennedy Bucker, MD;  Location: [facility] ORS;  Service: Orthopedics;  Laterality: N/A;   TONSILLECTOMY      Family History: Family History  Problem Relation Age of Onset   Premature CHD Brother     Social History: Social History   Socioeconomic History   Marital status: Widowed  Spouse name: Not on file   Number of children: Not on file   Years of education: Not on file   Highest education level: Not on file  Occupational History   Not on file  Tobacco Use   Smoking status: Former    Current packs/day: 0.00    Types: Cigarettes    Quit date: 04/18/1984    Years since quitting: 39.3   Smokeless tobacco: Never  Vaping Use   Vaping status: Former   Start date: 05/16/1965   Quit date: 05/16/1989  Substance and Sexual Activity   Alcohol use: No   Drug use: No   Sexual activity: Never  Other Topics Concern   Not on file  Social History Narrative   Not on file   Social Drivers of Health   Financial Resource Strain: Not on file  Food Insecurity: No Food Insecurity (02/23/2023)   Hunger Vital Sign    Worried About Running Out of Food in the Last Year: Never true    Ran Out of Food in the Last Year: Never true  Transportation Needs: No Transportation Needs (02/23/2023)   PRAPARE - Administrator, Civil Service (Medical): No    Lack of Transportation (Non-Medical): No  Physical Activity: Not on file  Stress: Not on file  Social Connections: Not on file    Allergies:  Allergies   Allergen Reactions   Ciprofloxacin    Digoxin And Related    Entresto [Sacubitril-Valsartan] Other (See Comments)    Hypotensive    Erythromycin    Lopid [Gemfibrozil]    Nitrofurantoin    Spironolactone Other (See Comments)    Hospitalized for dehydration   Cefuroxime Rash   Penicillins Rash    Has patient had a PCN reaction causing immediate rash, facial/tongue/throat swelling, SOB or lightheadedness with hypotension: Unknown Has patient had a PCN reaction causing severe rash involving mucus membranes or skin necrosis: No Has patient had a PCN reaction that required hospitalization: No Has patient had a PCN reaction occurring within the last 10 years: No If all of the above answers are "NO", then may proceed with Cephalosporin use.     Objective:    Vital Signs:   Temp:  [97.8 F (36.6 C)-98.4 F (36.9 C)] 97.9 F (36.6 C) (02/14 0915) Pulse Rate:  [79-86] 85 (02/14 1500) Resp:  [13-20] 19 (02/14 1500) BP: (100-129)/(53-103) 103/65 (02/14 1500) SpO2:  [92 %-99 %] 99 % (02/14 1500) FiO2 (%):  [40 %] 40 % (02/14 0850)    Weight change: Filed Weights   07/30/23 1542  Weight: 51.7 kg    Intake/Output:  No intake or output data in the 24 hours ending 07/31/23 1723    Physical Exam    General:  Elderly woman lying in bed on HHF O2  HEENT: normal Neck: supple. JVP to ear . Carotids 2+ bilat; no bruits. No lymphadenopathy or thyromegaly appreciated. Cor: Irregular rate & rhythm. 2/6 TR Lungs: clear Abdomen: soft, nontender, + distended. No hepatosplenomegaly. No bruits or masses. Good bowel sounds. Extremities: no cyanosis, clubbing, rash, 2+ edema  crusted  ulcer on L great toe Neuro:awake follows commands non-focal   Telemetry   AFL 80s Personally reviewed  EKG    AFL 83 nonspecific ST abnormality Personally reviewed   Labs   Basic Metabolic Panel: Recent Labs  Lab 07/30/23 1546 07/31/23 0520  NA 138 138  K 5.1 4.3  CL 110 109  CO2 17* 20*   GLUCOSE 109* 105*  BUN 52* 52*  CREATININE 1.86* 1.79*  CALCIUM 8.8* 8.4*    Liver Function Tests: No results for input(s): "AST", "ALT", "ALKPHOS", "BILITOT", "PROT", "ALBUMIN" in the last 168 hours. No results for input(s): "LIPASE", "AMYLASE" in the last 168 hours. No results for input(s): "AMMONIA" in the last 168 hours.  CBC: Recent Labs  Lab 07/30/23 1546 07/31/23 0520  WBC 11.8* 6.2  HGB 14.9 12.1  HCT 49.7* 40.2  MCV 85.0 81.5  PLT 178 160    Cardiac Enzymes: No results for input(s): "CKTOTAL", "CKMB", "CKMBINDEX", "TROPONINI" in the last 168 hours.  BNP: BNP (last 3 results) Recent Labs    02/22/23 0745 07/30/23 1546  BNP 746.2* 1,404.2*    ProBNP (last 3 results) No results for input(s): "PROBNP" in the last 8760 hours.   CBG: Recent Labs  Lab 07/30/23 2108 07/31/23 0911 07/31/23 1134  GLUCAP 105* 105* 111*    Coagulation Studies: Recent Labs    07/30/23 1546  LABPROT 17.4*  INR 1.4*     Imaging   No results found.   Medications:     Current Medications:  apixaban  2.5 mg Oral BID   arformoterol  15 mcg Nebulization BID   atorvastatin  80 mg Oral Daily   budesonide  0.25 mg Nebulization BID   empagliflozin  10 mg Oral Daily   furosemide  80 mg Intravenous BID   insulin aspart  0-15 Units Subcutaneous TID WC   insulin aspart  0-5 Units Subcutaneous QHS   irbesartan  75 mg Oral Daily   levothyroxine  50 mcg Oral QAC breakfast   pantoprazole  40 mg Oral BID   potassium chloride  20 mEq Oral Daily   senna-docusate  2 tablet Oral Daily   sertraline  50 mg Oral Daily    Infusions:     Assessment/Plan   1. Acute on chronic hypoxic respiratory failure - wears 4L O2 at baseline - based on work-up, exacerbation seems to be primarily due to HF but degree of hypoxemia seems to out of proportion to HF alone - CXR suggestive of chronic PH but otherwise unremarkable - continue diuresis. (Increase lasix to 80IV bid) and HF O2 -  If unable to wean oxygen may need CT   2. Acute on chronic HF with mildly decreased EF - Echo 06/02/17: EF 30-35% with mild LVH, mild AR, mild/ moderate MR/TR, mild LAE - Echo 01/05/19: EF 55-60% with mild LAE - Echo 11/18/21: EF 20-25% with severe LVH, Grade II DD, mild LAE, mild MR - Echo 11/27/21: EF 40-45% along with mild LVH and mild MR.  - Echo 03/06/23: EF 50% (@ [place]) - Remains markedly volume overloaded. Increase lasix to 80 IV bid - Would hold ARB with AKI - Hold jardiance with acidosis on admit (not evaluated for DKA)  - has allergy to spironolactone & entresto (hypotension) - On d/c consider torsemide 40 every other day alternating with 20 every other day - Consider repeat echo as needed   3. CAD - has h/o NSTEMI but I do not see cath results.  - hs trop ok. No evidence of ACS - continue Eliquis/statin   4: PAF- - Back in AFL now but rate controlled - Continue Eliquis - No role for DC-CV at this point  5. AKI  - suspect due to cardiorenal syndrome - b/l Scr 1.2-1.3 - should improve with diuresis - follow daily BMET - Hold ARB and SGLT2i   Length of Stay: 1  Reuel Boom  Lochlann Mastrangelo, MD  07/31/2023, 5:23 PM  Advanced Heart Failure Team Pager [phone] (M-F; 7a - 5p)  Please contact [facility] for night-coverage after hours (4p -7a ) and weekends on [email or web address]

## 2023-07-31 NOTE — Progress Notes (Signed)
Patient ID: Jacqueline [name], female   DOB: 06-03-1933, 89 y.o.   MRN: [id number] [facility]    SUBJECTIVE: Patient still short of breath supplemental oxygen lying in bed in supine position no chest pain no fever chills or sweats no coughing still somewhat tachycardic   Vitals:   07/31/23 1745 07/31/23 1830 07/31/23 2006 07/31/23 2146  BP:  (!) 105/56 105/61 104/64  Pulse: 84 79 83 83  Resp: (!) 24 (!) 21 17 18   Temp:   98 F (36.7 C) 97.9 F (36.6 C)  TempSrc:   Oral Oral  SpO2: 99% 95% 100% 99%  Weight:      Height:         Intake/Output Summary (Last 24 hours) at 07/31/2023 2215 Last data filed at 07/31/2023 1200 Gross per 24 hour  Intake --  Output 900 ml  Net -900 ml      PHYSICAL EXAM  General: Well developed, well nourished, in no acute distress HEENT:  Normocephalic and atramatic Neck:  No JVD.  Lungs: Clear bilaterally to auscultation and percussion. Heart: HRRR . Normal S1 and S2 without gallops or murmurs.  Abdomen: Bowel sounds are positive, abdomen soft and non-tender  Msk:  Back normal, normal gait. Normal strength and tone for age. Extremities: No clubbing, cyanosis or edema.   Neuro: Alert and oriented X 3. Psych:  Good affect, responds appropriately   LABS: Basic Metabolic Panel: Recent Labs    07/30/23 1546 07/31/23 0520  NA 138 138  K 5.1 4.3  CL 110 109  CO2 17* 20*  GLUCOSE 109* 105*  BUN 52* 52*  CREATININE 1.86* 1.79*  CALCIUM 8.8* 8.4*   Liver Function Tests: No results for input(s): "AST", "ALT", "ALKPHOS", "BILITOT", "PROT", "ALBUMIN" in the last 72 hours. No results for input(s): "LIPASE", "AMYLASE" in the last 72 hours. CBC: Recent Labs    07/30/23 1546 07/31/23 0520  WBC 11.8* 6.2  HGB 14.9 12.1  HCT 49.7* 40.2  MCV 85.0 81.5  PLT 178 160   Cardiac Enzymes: No results for input(s): "CKTOTAL", "CKMB", "CKMBINDEX", "TROPONINI" in the last 72 hours. BNP: Invalid input(s): "POCBNP" D-Dimer: No results for input(s):  "DDIMER" in the last 72 hours. Hemoglobin A1C: No results for input(s): "HGBA1C" in the last 72 hours. Fasting Lipid Panel: No results for input(s): "CHOL", "HDL", "LDLCALC", "TRIG", "CHOLHDL", "LDLDIRECT" in the last 72 hours. Thyroid Function Tests: No results for input(s): "TSH", "T4TOTAL", "T3FREE", "THYROIDAB" in the last 72 hours.  Invalid input(s): "FREET3" Anemia Panel: No results for input(s): "VITAMINB12", "FOLATE", "FERRITIN", "TIBC", "IRON", "RETICCTPCT" in the last 72 hours.  ECHOCARDIOGRAM COMPLETE Result Date: 07/31/2023    ECHOCARDIOGRAM REPORT   Patient Name:   Jacqueline [name] Date of Exam: 07/31/2023 Medical Rec #:  [id number]     Height:       57.0 in Accession #:    [id number]    Weight:       114.0 lb Date of Birth:  1933/02/23     BSA:          1.416 m Patient Age:    89 years      BP:           103/65 mmHg Patient Gender: F             HR:           86 bpm. Exam Location:  [facility] Procedure: 2D Echo, Cardiac Doppler and Color Doppler (Both Spectral and Color  Flow Doppler were utilized during procedure). Indications:     Acute Respiratory Distress  History:         Patient has prior history of Echocardiogram examinations, most                  recent 11/27/2021. CHF and Cardiomyopathy, Previous Myocardial                  Infarction, COPD and Stroke, Arrythmias:Atrial Fibrillation;                  Risk Factors:Hypertension, Diabetes and Dyslipidemia. CKD.  Sonographer:     Mikki Harbor Referring Phys:  [id number] Andris Baumann Diagnosing Phys: Alwyn Pea MD  Sonographer Comments: Image acquisition challenging due to respiratory motion. IMPRESSIONS  1. Left ventricular ejection fraction, by estimation, is 40 to 45%. The left ventricle has mildly decreased function. The left ventricle demonstrates global hypokinesis. There is mild asymmetric left ventricular hypertrophy of the inferior and basal segments. Left ventricular diastolic parameters are consistent with Grade I  diastolic dysfunction (impaired relaxation).  2. Right ventricular systolic function is moderately reduced. The right ventricular size is moderately enlarged. There is mildly elevated pulmonary artery systolic pressure.  3. The mitral valve is grossly normal. Mild mitral valve regurgitation.  4. The aortic valve is normal in structure. Aortic valve regurgitation is mild. FINDINGS  Left Ventricle: Left ventricular ejection fraction, by estimation, is 40 to 45%. The left ventricle has mildly decreased function. The left ventricle demonstrates global hypokinesis. Strain imaging was not performed. The left ventricular internal cavity  size was normal in size. There is mild asymmetric left ventricular hypertrophy of the inferior and basal segments. Left ventricular diastolic parameters are consistent with Grade I diastolic dysfunction (impaired relaxation). Right Ventricle: The right ventricular size is moderately enlarged. No increase in right ventricular wall thickness. Right ventricular systolic function is moderately reduced. There is mildly elevated pulmonary artery systolic pressure. The tricuspid regurgitant velocity is 2.71 m/s, and with an assumed right atrial pressure of 15 mmHg, the estimated right ventricular systolic pressure is 44.4 mmHg. Left Atrium: Left atrial size was normal in size. Right Atrium: Right atrial size was normal in size. Pericardium: There is no evidence of pericardial effusion. Mitral Valve: The mitral valve is grossly normal. Mild to moderate mitral annular calcification. Mild mitral valve regurgitation. MV peak gradient, 4.9 mmHg. The mean mitral valve gradient is 2.0 mmHg. Tricuspid Valve: The tricuspid valve is normal in structure. Tricuspid valve regurgitation is mild. Aortic Valve: The aortic valve is normal in structure. Aortic valve regurgitation is mild. Aortic valve mean gradient measures 3.0 mmHg. Aortic valve peak gradient measures 5.8 mmHg. Aortic valve area, by VTI measures  1.85 cm. Pulmonic Valve: The pulmonic valve was normal in structure. Pulmonic valve regurgitation is mild to moderate. Aorta: The ascending aorta was not well visualized. IAS/Shunts: No atrial level shunt detected by color flow Doppler. Additional Comments: 3D imaging was not performed.  LEFT VENTRICLE PLAX 2D LVIDd:         4.30 cm   Diastology LVIDs:         3.30 cm   LV e' lateral:   9.36 cm/s LV PW:         1.30 cm   LV E/e' lateral: 11.3 LV IVS:        1.00 cm LVOT diam:     2.00 cm LV SV:         39 LV SV  Index:   28 LVOT Area:     3.14 cm  RIGHT VENTRICLE RV Basal diam:  3.95 cm RV Mid diam:    5.20 cm LEFT ATRIUM             Index        RIGHT ATRIUM           Index LA diam:        4.10 cm 2.89 cm/m   RA Area:     20.10 cm LA Vol (A2C):   62.8 ml 44.34 ml/m  RA Volume:   61.90 ml  43.70 ml/m LA Vol (A4C):   49.0 ml 34.59 ml/m LA Biplane Vol: 56.1 ml 39.61 ml/m  AORTIC VALVE                    PULMONIC VALVE AV Area (Vmax):    1.95 cm     PV Vmax:       0.76 m/s AV Area (Vmean):   1.80 cm     PV Peak grad:  2.3 mmHg AV Area (VTI):     1.85 cm AV Vmax:           120.67 cm/s AV Vmean:          78.367 cm/s AV VTI:            0.212 m AV Peak Grad:      5.8 mmHg AV Mean Grad:      3.0 mmHg LVOT Vmax:         74.83 cm/s LVOT Vmean:        44.833 cm/s LVOT VTI:          0.125 m LVOT/AV VTI ratio: 0.59  AORTA Ao Root diam: 3.20 cm Ao Asc diam:  3.30 cm MITRAL VALVE                TRICUSPID VALVE MV Area (PHT): 3.72 cm     TR Peak grad:   29.4 mmHg MV Area VTI:   1.68 cm     TR Vmax:        271.00 cm/s MV Peak grad:  4.9 mmHg MV Mean grad:  2.0 mmHg     SHUNTS MV Vmax:       1.11 m/s     Systemic VTI:  0.12 m MV Vmean:      65.7 cm/s    Systemic Diam: 2.00 cm MV Decel Time: 204 msec MV E velocity: 106.00 cm/s Alwyn Pea MD Electronically signed by Alwyn Pea MD Signature Date/Time: 07/31/2023/10:02:24 PM    Final    DG Chest 2 View Result Date: 07/30/2023 CLINICAL DATA:  Shortness of  breath EXAM: CHEST - 2 VIEW COMPARISON:  X-ray 02/22/2023 and older. FINDINGS: Normal cardiopericardial silhouette with calcified aorta. There is fullness of the hila bilaterally which could be enlarged pulmonary arteries. No pneumothorax, effusion or edema. Film is under penetrated. There is compression deformity of midthoracic spine vertebral level with some augmentation cement, unchanged. Sclerotic lesion along the left proximal humerus is again noted and could be a chondroid lesion. IMPRESSION: Enlarged central pulmonary arteries as on prior. No consolidation or effusion. Stable compression deformity of a midthoracic spine vertebral level with augmentation cement Electronically Signed   By: Karen Kays M.D.   On: 07/30/2023 17:17     Echo mildly reduced left ventricular function EF around 45 to 50% mild to moderate pulmonary hypertension  TELEMETRY: Sinus tach rate of 110:  ASSESSMENT AND PLAN:  Active Problems:   Acute on chronic heart failure with preserved ejection fraction (HFpEF) (HCC)   Hypertension   Acquired hypothyroidism   Acute renal failure superimposed on stage 3a chronic kidney disease (HCC)   Chronic atrial fibrillation (HCC)   Type 2 diabetes mellitus without complications (HCC)   Dilated idiopathic cardiomyopathy (HCC)   Recurrent major depressive disorder, in partial remission (HCC)   Acute on chronic respiratory failure with hypoxia (HCC)   Peripheral cyanosis   Hypotension   COPD (chronic obstructive pulmonary disease) (HCC)   History of CVA (cerebrovascular accident)    Plan Shortness of breath related to heart failure continue diuretic therapy supplemental oxygen as necessary agree with advanced heart failure consult by Dr. Gala Romney Nonischemic cardiomyopathy ejection fraction somewhat improved at around 45 to 50% continue current medical therapy Hypoxemia multifactorial continue supplemental oxygen consider pulmonary input Acute on chronic renal  insufficiency stage III recommend adequate renal perfusion consider nephrology input COPD chronic with hypoxemia supplemental oxygen inhalers consider pulmonary input Diabetes type 2 continue current medical therapy Chronic recurrent hypotension hold ARB until diuresis with Lasix Echocardiogram for assessment of left ventricular function wall motion and valvular structures   Alwyn Pea, MD, 07/31/2023 10:15 PM

## 2023-07-31 NOTE — Progress Notes (Signed)
*  PRELIMINARY RESULTS* Echocardiogram 2D Echocardiogram has been performed.  Carolyne Fiscal 07/31/2023, 6:21 PM

## 2023-07-31 NOTE — Progress Notes (Signed)
Heart Failure Navigator Progress Note  Assessed for Heart & Vascular TOC clinic readiness.  Patient does not meet criteria due to Advanced Heart Failure Team patient of Arvilla Meres, MD.  Navigator will sign of at this time.  [name], RN, BSN [facility] Heart Failure Navigator Secure Chat Only

## 2023-07-31 NOTE — ED Notes (Signed)
Dr. Myriam Forehand has been notified of pt's BP and holding the lasix at this time.

## 2023-08-01 DIAGNOSIS — I5033 Acute on chronic diastolic (congestive) heart failure: Secondary | ICD-10-CM | POA: Diagnosis not present

## 2023-08-01 DIAGNOSIS — J9621 Acute and chronic respiratory failure with hypoxia: Secondary | ICD-10-CM | POA: Diagnosis not present

## 2023-08-01 LAB — BASIC METABOLIC PANEL
Anion gap: 8 (ref 5–15)
BUN: 51 mg/dL — ABNORMAL HIGH (ref 8–23)
CO2: 23 mmol/L (ref 22–32)
Calcium: 8.6 mg/dL — ABNORMAL LOW (ref 8.9–10.3)
Chloride: 109 mmol/L (ref 98–111)
Creatinine, Ser: 1.66 mg/dL — ABNORMAL HIGH (ref 0.44–1.00)
GFR, Estimated: 29 mL/min — ABNORMAL LOW (ref >60)
Glucose, Bld: 96 mg/dL (ref 70–99)
Potassium: 4.1 mmol/L (ref 3.5–5.1)
Sodium: 140 mmol/L (ref 135–145)

## 2023-08-01 LAB — GLUCOSE, CAPILLARY
Glucose-Capillary: 93 mg/dL (ref 70–99)
Glucose-Capillary: 119 mg/dL — ABNORMAL HIGH (ref 70–99)
Glucose-Capillary: 103 mg/dL — ABNORMAL HIGH (ref 70–99)
Glucose-Capillary: 90 mg/dL (ref 70–99)

## 2023-08-01 LAB — MAGNESIUM: Magnesium: 2.3 mg/dL (ref 1.7–2.4)

## 2023-08-01 MED ORDER — FUROSEMIDE 10 MG/ML IJ SOLN
40.0000 mg | Freq: Two times a day (BID) | INTRAMUSCULAR | Status: DC
  Administered 2023-08-01 – 2023-08-02 (×3): 40 mg via INTRAVENOUS
  Filled 2023-08-01 (×3): qty 4

## 2023-08-01 NOTE — Progress Notes (Addendum)
Progress Note    Jacqueline [name]  RUE:[id number] DOB: 1932-07-28  DOA: 07/30/2023 PCP: Earnestine Mealing, MD      Brief Narrative:    Medical records reviewed and are as summarized below:  Jacqueline [name] is a 89 y.o. female  with medical history significant for NSTEMI 11/2021, T2DM, paroxysmal atrial fibrillation on Eliquis, CVA 11/2021, HTN, hypothyroidism, anemia, COPD on home O2 at 4 L, HFrEF with improved EF secondary to NICM, last EF 50% in September 2024 (previously 20 to 25% 2023).  She was referred from the cardiologist office to the emergency department because of concerns of CHF exacerbation.  Her daughter reported that patient was found to have bluish discoloration of her fingers up to her knuckles and increasing swelling of her lower extremities.  She had also become increasingly short of breath.      Assessment/Plan:   Active Problems:   Acute on chronic heart failure with preserved ejection fraction (HFpEF) (HCC)   Dilated idiopathic cardiomyopathy (HCC)   Acute on chronic respiratory failure with hypoxia (HCC)   Hypertension   Peripheral cyanosis   Hypotension   Chronic atrial fibrillation (HCC)   Acute renal failure superimposed on stage 3a chronic kidney disease (HCC)   COPD (chronic obstructive pulmonary disease) (HCC)   Acquired hypothyroidism   Type 2 diabetes mellitus without complications (HCC)   Recurrent major depressive disorder, in partial remission (HCC)   History of CVA (cerebrovascular accident)    Body mass index is 25.48 kg/m.   Acute on chronic HFpEF: Recent IV Lasix from 80 mg to 40 mg twice daily because of low blood pressure.  BMP, daily weight and urine output.   2D echo on 07/30/2022 showed EF estimated at 40 to 45%, mild LVH, grade 1 diastolic dysfunction, mildly elevated pulmonary artery systolic pressure, mild MR 2D echo in September 2024 showed EF estimated at 50%.   Hypotension: Decrease IV Lasix.  Home valsartan is on  hold. Monitor BP closely.   Acute on chronic hypoxemic respiratory failure: She is still on heated humidified high flow oxygen (weaned down to FiO2 of 35% at 35 L/min).  Wean down oxygen as able.  She uses 4 L oxygen at baseline.    AKI on CKD stage IIIa: Improving.  Creatinine down from 1.86-1.66.  This probably from cardiorenal syndrome.  Monitor BMP.   Paroxysmal atrial fibrillation/atrial flutter, history of stroke: Continue Eliquis   Type 2 diabetes mellitus: Continue correctional scale NovoLog.   Comorbidities include hypothyroidism, recurrent major depression, hypertension, cognitive decline, ? dementia   Plan discussed with [name], daughter, at the bedside  Diet Order             Diet heart healthy/carb modified Room service appropriate? Yes; Fluid consistency: Thin  Diet effective now                            Consultants: None  Procedures: None    Medications:    apixaban  2.5 mg Oral BID   arformoterol  15 mcg Nebulization BID   atorvastatin  80 mg Oral Daily   budesonide  0.25 mg Nebulization BID   furosemide  40 mg Intravenous BID   insulin aspart  0-15 Units Subcutaneous TID WC   insulin aspart  0-5 Units Subcutaneous QHS   levothyroxine  50 mcg Oral QAC breakfast   pantoprazole  40 mg Oral BID   potassium chloride  20 mEq  Oral Daily   senna-docusate  2 tablet Oral Daily   sertraline  50 mg Oral Daily   Continuous Infusions:   Anti-infectives (From admission, onward)    None              Family Communication/Anticipated D/C date and plan/Code Status   DVT prophylaxis: apixaban (ELIQUIS) tablet 2.5 mg Start: 07/30/23 2330 apixaban (ELIQUIS) tablet 2.5 mg     Code Status: Limited: Do not attempt resuscitation (DNR) -DNR-LIMITED -Do Not Intubate/DNI   Family Communication: Plan discussed with [name], daughter, at the bedside Disposition Plan: Plan to discharge to SNF   Status is: Inpatient Remains inpatient  appropriate because: CHF exacerbation       Subjective:   Interval events noted.  She complained of hunger.  Chelsea, RN and nurse assistant were at the bedside.  [name] , daughter, also walked into the room during this encounter.  Objective:    Vitals:   08/01/23 0729 08/01/23 0753 08/01/23 0909 08/01/23 1158  BP:   108/60 (!) 90/53  Pulse:   83   Resp: 16  19   Temp:   98.1 F (36.7 C) 97.7 F (36.5 C)  TempSrc:   Oral Oral  SpO2:  95% 96% 99%  Weight:      Height:       No data found.   Intake/Output Summary (Last 24 hours) at 08/01/2023 1339 Last data filed at 08/01/2023 0900 Gross per 24 hour  Intake 240 ml  Output --  Net 240 ml   Filed Weights   07/30/23 1542 08/01/23 0500  Weight: 51.7 kg 53.4 kg    Exam:  GEN: NAD SKIN: Warm and dry EYES: No pallor or icterus ENT: MMM CV: RRR PULM: CTA B ABD: soft, ND, NT, +BS CNS: AAO x 2 (person and place), speech is slow and soft, non focal EXT: Bilateral leg and pedal edema.  Tenderness on tip of left big toe.  She has a chronic healing wound near the tip of the left big toe     Data Reviewed:   I have personally reviewed following labs and imaging studies:  Labs: Labs show the following:   Basic Metabolic Panel: Recent Labs  Lab 07/30/23 1546 07/31/23 0520 08/01/23 0515  NA 138 138 140  K 5.1 4.3 4.1  CL 110 109 109  CO2 17* 20* 23  GLUCOSE 109* 105* 96  BUN 52* 52* 51*  CREATININE 1.86* 1.79* 1.66*  CALCIUM 8.8* 8.4* 8.6*  MG  --   --  2.3   GFR Estimated Creatinine Clearance: 16.5 mL/min (A) (by C-G formula based on SCr of 1.66 mg/dL (H)). Liver Function Tests: No results for input(s): "AST", "ALT", "ALKPHOS", "BILITOT", "PROT", "ALBUMIN" in the last 168 hours. No results for input(s): "LIPASE", "AMYLASE" in the last 168 hours. No results for input(s): "AMMONIA" in the last 168 hours. Coagulation profile Recent Labs  Lab 07/30/23 1546  INR 1.4*    CBC: Recent Labs  Lab  07/30/23 1546 07/31/23 0520  WBC 11.8* 6.2  HGB 14.9 12.1  HCT 49.7* 40.2  MCV 85.0 81.5  PLT 178 160   Cardiac Enzymes: No results for input(s): "CKTOTAL", "CKMB", "CKMBINDEX", "TROPONINI" in the last 168 hours. BNP (last 3 results) No results for input(s): "PROBNP" in the last 8760 hours. CBG: Recent Labs  Lab 07/31/23 1134 07/31/23 1749 07/31/23 2147 08/01/23 0907 08/01/23 1155  GLUCAP 111* 110* 127* 93 119*   D-Dimer: No results for input(s): "DDIMER" in  the last 72 hours. Hgb A1c: No results for input(s): "HGBA1C" in the last 72 hours. Lipid Profile: No results for input(s): "CHOL", "HDL", "LDLCALC", "TRIG", "CHOLHDL", "LDLDIRECT" in the last 72 hours. Thyroid function studies: No results for input(s): "TSH", "T4TOTAL", "T3FREE", "THYROIDAB" in the last 72 hours.  Invalid input(s): "FREET3" Anemia work up: No results for input(s): "VITAMINB12", "FOLATE", "FERRITIN", "TIBC", "IRON", "RETICCTPCT" in the last 72 hours. Sepsis Labs: Recent Labs  Lab 07/30/23 1546 07/30/23 2352 07/31/23 0520  PROCALCITON  --  <0.10  --   WBC 11.8*  --  6.2  LATICACIDVEN  --  1.2  --     Microbiology Recent Results (from the past 240 hours)  Resp panel by RT-PCR (RSV, Flu A&B, Covid) Anterior Nasal Swab     Status: None   Collection Time: 07/30/23  6:07 PM   Specimen: Anterior Nasal Swab  Result Value Ref Range Status   SARS Coronavirus 2 by RT PCR NEGATIVE NEGATIVE Final    Comment: (NOTE) SARS-CoV-2 target nucleic acids are NOT DETECTED.  The SARS-CoV-2 RNA is generally detectable in upper respiratory specimens during the acute phase of infection. The lowest concentration of SARS-CoV-2 viral copies this assay can detect is 138 copies/mL. A negative result does not preclude SARS-Cov-2 infection and should not be used as the sole basis for treatment or other patient management decisions. A negative result may occur with  improper specimen collection/handling, submission of  specimen other than nasopharyngeal swab, presence of viral mutation(s) within the areas targeted by this assay, and inadequate number of viral copies(<138 copies/mL). A negative result must be combined with clinical observations, patient history, and epidemiological information. The expected result is Negative.  Fact Sheet for Patients:  BloggerCourse.com  Fact Sheet for Healthcare Providers:  SeriousBroker.it  This test is no t yet approved or cleared by the [place] FDA and  has been authorized for detection and/or diagnosis of SARS-CoV-2 by FDA under an Emergency Use Authorization (EUA). This EUA will remain  in effect (meaning this test can be used) for the duration of the COVID-19 declaration under Section 564(b)(1) of the Act, 21 U.S.C.section 360bbb-3(b)(1), unless the authorization is terminated  or revoked sooner.       Influenza A by PCR NEGATIVE NEGATIVE Final   Influenza B by PCR NEGATIVE NEGATIVE Final    Comment: (NOTE) The Xpert Xpress SARS-CoV-2/FLU/RSV plus assay is intended as an aid in the diagnosis of influenza from Nasopharyngeal swab specimens and should not be used as a sole basis for treatment. Nasal washings and aspirates are unacceptable for Xpert Xpress SARS-CoV-2/FLU/RSV testing.  Fact Sheet for Patients: BloggerCourse.com  Fact Sheet for Healthcare Providers: SeriousBroker.it  This test is not yet approved or cleared by the [place] FDA and has been authorized for detection and/or diagnosis of SARS-CoV-2 by FDA under an Emergency Use Authorization (EUA). This EUA will remain in effect (meaning this test can be used) for the duration of the COVID-19 declaration under Section 564(b)(1) of the Act, 21 U.S.C. section 360bbb-3(b)(1), unless the authorization is terminated or revoked.     Resp Syncytial Virus by PCR NEGATIVE NEGATIVE Final     Comment: (NOTE) Fact Sheet for Patients: BloggerCourse.com  Fact Sheet for Healthcare Providers: SeriousBroker.it  This test is not yet approved or cleared by the [place] FDA and has been authorized for detection and/or diagnosis of SARS-CoV-2 by FDA under an Emergency Use Authorization (EUA). This EUA will remain in effect (meaning this test can be  used) for the duration of the COVID-19 declaration under Section 564(b)(1) of the Act, 21 U.S.C. section 360bbb-3(b)(1), unless the authorization is terminated or revoked.  Performed at [facility], [address] [place], [place] [address]     Procedures and diagnostic studies:  ECHOCARDIOGRAM COMPLETE Result Date: 07/31/2023    ECHOCARDIOGRAM REPORT   Patient Name:   Jacqueline [name] Date of Exam: 07/31/2023 Medical Rec #:  [id number]     Height:       57.0 in Accession #:    [id number]    Weight:       114.0 lb Date of Birth:  1932-07-02     BSA:          1.416 m Patient Age:    89 years      BP:           103/65 mmHg Patient Gender: F             HR:           86 bpm. Exam Location:  [facility] Procedure: 2D Echo, Cardiac Doppler and Color Doppler (Both Spectral and Color            Flow Doppler were utilized during procedure). Indications:     Acute Respiratory Distress  History:         Patient has prior history of Echocardiogram examinations, most                  recent 11/27/2021. CHF and Cardiomyopathy, Previous Myocardial                  Infarction, COPD and Stroke, Arrythmias:Atrial Fibrillation;                  Risk Factors:Hypertension, Diabetes and Dyslipidemia. CKD.  Sonographer:     Mikki Harbor Referring Phys:  [id number] Andris Baumann Diagnosing Phys: Alwyn Pea MD  Sonographer Comments: Image acquisition challenging due to respiratory motion. IMPRESSIONS  1. Left ventricular ejection fraction, by estimation, is 40 to 45%. The left ventricle has mildly  decreased function. The left ventricle demonstrates global hypokinesis. There is mild asymmetric left ventricular hypertrophy of the inferior and basal segments. Left ventricular diastolic parameters are consistent with Grade I diastolic dysfunction (impaired relaxation).  2. Right ventricular systolic function is moderately reduced. The right ventricular size is moderately enlarged. There is mildly elevated pulmonary artery systolic pressure.  3. The mitral valve is grossly normal. Mild mitral valve regurgitation.  4. The aortic valve is normal in structure. Aortic valve regurgitation is mild. FINDINGS  Left Ventricle: Left ventricular ejection fraction, by estimation, is 40 to 45%. The left ventricle has mildly decreased function. The left ventricle demonstrates global hypokinesis. Strain imaging was not performed. The left ventricular internal cavity  size was normal in size. There is mild asymmetric left ventricular hypertrophy of the inferior and basal segments. Left ventricular diastolic parameters are consistent with Grade I diastolic dysfunction (impaired relaxation). Right Ventricle: The right ventricular size is moderately enlarged. No increase in right ventricular wall thickness. Right ventricular systolic function is moderately reduced. There is mildly elevated pulmonary artery systolic pressure. The tricuspid regurgitant velocity is 2.71 m/s, and with an assumed right atrial pressure of 15 mmHg, the estimated right ventricular systolic pressure is 44.4 mmHg. Left Atrium: Left atrial size was normal in size. Right Atrium: Right atrial size was normal in size. Pericardium: There is no evidence of pericardial effusion. Mitral Valve: The mitral valve  is grossly normal. Mild to moderate mitral annular calcification. Mild mitral valve regurgitation. MV peak gradient, 4.9 mmHg. The mean mitral valve gradient is 2.0 mmHg. Tricuspid Valve: The tricuspid valve is normal in structure. Tricuspid valve regurgitation  is mild. Aortic Valve: The aortic valve is normal in structure. Aortic valve regurgitation is mild. Aortic valve mean gradient measures 3.0 mmHg. Aortic valve peak gradient measures 5.8 mmHg. Aortic valve area, by VTI measures 1.85 cm. Pulmonic Valve: The pulmonic valve was normal in structure. Pulmonic valve regurgitation is mild to moderate. Aorta: The ascending aorta was not well visualized. IAS/Shunts: No atrial level shunt detected by color flow Doppler. Additional Comments: 3D imaging was not performed.  LEFT VENTRICLE PLAX 2D LVIDd:         4.30 cm   Diastology LVIDs:         3.30 cm   LV e' lateral:   9.36 cm/s LV PW:         1.30 cm   LV E/e' lateral: 11.3 LV IVS:        1.00 cm LVOT diam:     2.00 cm LV SV:         39 LV SV Index:   28 LVOT Area:     3.14 cm  RIGHT VENTRICLE RV Basal diam:  3.95 cm RV Mid diam:    5.20 cm LEFT ATRIUM             Index        RIGHT ATRIUM           Index LA diam:        4.10 cm 2.89 cm/m   RA Area:     20.10 cm LA Vol (A2C):   62.8 ml 44.34 ml/m  RA Volume:   61.90 ml  43.70 ml/m LA Vol (A4C):   49.0 ml 34.59 ml/m LA Biplane Vol: 56.1 ml 39.61 ml/m  AORTIC VALVE                    PULMONIC VALVE AV Area (Vmax):    1.95 cm     PV Vmax:       0.76 m/s AV Area (Vmean):   1.80 cm     PV Peak grad:  2.3 mmHg AV Area (VTI):     1.85 cm AV Vmax:           120.67 cm/s AV Vmean:          78.367 cm/s AV VTI:            0.212 m AV Peak Grad:      5.8 mmHg AV Mean Grad:      3.0 mmHg LVOT Vmax:         74.83 cm/s LVOT Vmean:        44.833 cm/s LVOT VTI:          0.125 m LVOT/AV VTI ratio: 0.59  AORTA Ao Root diam: 3.20 cm Ao Asc diam:  3.30 cm MITRAL VALVE                TRICUSPID VALVE MV Area (PHT): 3.72 cm     TR Peak grad:   29.4 mmHg MV Area VTI:   1.68 cm     TR Vmax:        271.00 cm/s MV Peak grad:  4.9 mmHg MV Mean grad:  2.0 mmHg     SHUNTS MV Vmax:       1.11 m/s  Systemic VTI:  0.12 m MV Vmean:      65.7 cm/s    Systemic Diam: 2.00 cm MV Decel Time: 204 msec  MV E velocity: 106.00 cm/s Alwyn Pea MD Electronically signed by Alwyn Pea MD Signature Date/Time: 07/31/2023/10:02:24 PM    Final    DG Chest 2 View Result Date: 07/30/2023 CLINICAL DATA:  Shortness of breath EXAM: CHEST - 2 VIEW COMPARISON:  X-ray 02/22/2023 and older. FINDINGS: Normal cardiopericardial silhouette with calcified aorta. There is fullness of the hila bilaterally which could be enlarged pulmonary arteries. No pneumothorax, effusion or edema. Film is under penetrated. There is compression deformity of midthoracic spine vertebral level with some augmentation cement, unchanged. Sclerotic lesion along the left proximal humerus is again noted and could be a chondroid lesion. IMPRESSION: Enlarged central pulmonary arteries as on prior. No consolidation or effusion. Stable compression deformity of a midthoracic spine vertebral level with augmentation cement Electronically Signed   By: Karen Kays M.D.   On: 07/30/2023 17:17               LOS: 2 days   Twanna Resh  [facility]   Pager on [email or web address]. If 7PM-7AM, please contact night-coverage at [email or web address]     08/01/2023, 1:39 PM

## 2023-08-01 NOTE — Progress Notes (Addendum)
SUBJECTIVE: Patienis feeling much better   Vitals:   08/01/23 0729 08/01/23 0753 08/01/23 0909 08/01/23 1158  BP:   108/60 (!) 90/53  Pulse:   83   Resp: 16  19   Temp:   98.1 F (36.7 C) 97.7 F (36.5 C)  TempSrc:   Oral Oral  SpO2:  95% 96% 99%  Weight:      Height:        Intake/Output Summary (Last 24 hours) at 08/01/2023 1226 Last data filed at 08/01/2023 0900 Gross per 24 hour  Intake 240 ml  Output --  Net 240 ml    LABS: Basic Metabolic Panel: Recent Labs    07/31/23 0520 08/01/23 0515  NA 138 140  K 4.3 4.1  CL 109 109  CO2 20* 23  GLUCOSE 105* 96  BUN 52* 51*  CREATININE 1.79* 1.66*  CALCIUM 8.4* 8.6*  MG  --  2.3   Liver Function Tests: No results for input(s): "AST", "ALT", "ALKPHOS", "BILITOT", "PROT", "ALBUMIN" in the last 72 hours. No results for input(s): "LIPASE", "AMYLASE" in the last 72 hours. CBC: Recent Labs    07/30/23 1546 07/31/23 0520  WBC 11.8* 6.2  HGB 14.9 12.1  HCT 49.7* 40.2  MCV 85.0 81.5  PLT 178 160   Cardiac Enzymes: No results for input(s): "CKTOTAL", "CKMB", "CKMBINDEX", "TROPONINI" in the last 72 hours. BNP: Invalid input(s): "POCBNP" D-Dimer: No results for input(s): "DDIMER" in the last 72 hours. Hemoglobin A1C: No results for input(s): "HGBA1C" in the last 72 hours. Fasting Lipid Panel: No results for input(s): "CHOL", "HDL", "LDLCALC", "TRIG", "CHOLHDL", "LDLDIRECT" in the last 72 hours. Thyroid Function Tests: No results for input(s): "TSH", "T4TOTAL", "T3FREE", "THYROIDAB" in the last 72 hours.  Invalid input(s): "FREET3" Anemia Panel: No results for input(s): "VITAMINB12", "FOLATE", "FERRITIN", "TIBC", "IRON", "RETICCTPCT" in the last 72 hours.   PHYSICAL EXAM General: Well developed, well nourished, in no acute distress HEENT:  Normocephalic and atramatic Neck:  No JVD.  Lungs: Clear bilaterally to auscultation and percussion. Heart: HRRR . Normal S1 and S2 without gallops or murmurs.  Abdomen:  Bowel sounds are positive, abdomen soft and non-tender  Msk:  Back normal, normal gait. Normal strength and tone for age. Extremities: No clubbing, cyanosis or edema.   Neuro: Alert and oriented X 3. Psych:  Good affect, responds appropriately  TELEMETRY: NSR 80/min  ASSESSMENT AND PLAN:    ICD-10-CM   1. Acute respiratory failure with hypoxia (HCC)  J96.01     2. Acute on chronic congestive heart failure, unspecified heart failure type (HCC)  I50.9       Active Problems:   Acute on chronic heart failure with preserved ejection fraction (HFpEF) (HCC)   Hypertension   Acquired hypothyroidism   Acute renal failure superimposed on stage 3a chronic kidney disease (HCC)   Chronic atrial fibrillation (HCC)   Type 2 diabetes mellitus without complications (HCC)   Dilated idiopathic cardiomyopathy (HCC)   Recurrent major depressive disorder, in partial remission (HCC)   Acute on chronic respiratory failure with hypoxia (HCC)   Peripheral cyanosis   Hypotension   COPD (chronic obstructive pulmonary disease) (HCC)   History of CVA (cerebrovascular accident)        Plan Shortness of breath related to heart failure continue diuretic therapy supplemental oxygen as necessary agree with advanced heart failure consult by Dr. Gala Romney Nonischemic cardiomyopathy ejection fraction somewhat improved at around 45 to 50% continue current medical therapy Hypoxemia multifactorial continue supplemental oxygen  consider pulmonary input Acute on chronic renal insufficiency stage III recommend adequate renal perfusion consider nephrology input COPD chronic with hypoxemia supplemental oxygen inhalers consider pulmonary input Diabetes type 2 continue current medical therapy Chronic recurrent hypotension hold ARB until diuresis with Lasix Echocardiogram showedleft ventricular function showed LVEF 45%        Adrian Blackwater, MD, [facility] 08/01/2023 12:26 PM

## 2023-08-02 DIAGNOSIS — I5033 Acute on chronic diastolic (congestive) heart failure: Secondary | ICD-10-CM | POA: Diagnosis not present

## 2023-08-02 LAB — BASIC METABOLIC PANEL
Anion gap: 11 (ref 5–15)
BUN: 52 mg/dL — ABNORMAL HIGH (ref 8–23)
CO2: 19 mmol/L — ABNORMAL LOW (ref 22–32)
Calcium: 8.7 mg/dL — ABNORMAL LOW (ref 8.9–10.3)
Chloride: 109 mmol/L (ref 98–111)
Creatinine, Ser: 1.68 mg/dL — ABNORMAL HIGH (ref 0.44–1.00)
GFR, Estimated: 29 mL/min — ABNORMAL LOW (ref >60)
Glucose, Bld: 102 mg/dL — ABNORMAL HIGH (ref 70–99)
Potassium: 3.9 mmol/L (ref 3.5–5.1)
Sodium: 139 mmol/L (ref 135–145)

## 2023-08-02 LAB — GLUCOSE, CAPILLARY
Glucose-Capillary: 125 mg/dL — ABNORMAL HIGH (ref 70–99)
Glucose-Capillary: 126 mg/dL — ABNORMAL HIGH (ref 70–99)
Glucose-Capillary: 105 mg/dL — ABNORMAL HIGH (ref 70–99)
Glucose-Capillary: 120 mg/dL — ABNORMAL HIGH (ref 70–99)
Glucose-Capillary: 143 mg/dL — ABNORMAL HIGH (ref 70–99)

## 2023-08-02 MED ORDER — POLYETHYLENE GLYCOL 3350 17 G PO PACK
17.0000 g | PACK | Freq: Every day | ORAL | Status: DC
  Administered 2023-08-02 – 2023-08-03 (×2): 17 g via ORAL
  Filled 2023-08-02 (×2): qty 1

## 2023-08-02 NOTE — Progress Notes (Addendum)
Progress Note    Jacqueline [name]  UEA:[id number] DOB: December 28, 1932  DOA: 07/30/2023 PCP: Earnestine Mealing, MD      Brief Narrative:    Medical records reviewed and are as summarized below:  Jacqueline [name] is a 89 y.o. female  with medical history significant for NSTEMI 11/2021, T2DM, paroxysmal atrial fibrillation on Eliquis, CVA 11/2021, HTN, hypothyroidism, anemia, COPD on home O2 at 4 L, HFrEF with improved EF secondary to NICM, last EF 50% in September 2024 (previously 20 to 25% 2023).  She was referred from the cardiologist office to the emergency department because of concerns of CHF exacerbation.  Her daughter reported that patient was found to have bluish discoloration of her fingers up to her knuckles and increasing swelling of her lower extremities.  She had also become increasingly short of breath.      Assessment/Plan:   Active Problems:   Acute on chronic heart failure with preserved ejection fraction (HFpEF) (HCC)   Dilated idiopathic cardiomyopathy (HCC)   Acute on chronic respiratory failure with hypoxia (HCC)   Hypertension   Peripheral cyanosis   Hypotension   Chronic atrial fibrillation (HCC)   Acute renal failure superimposed on stage 3a chronic kidney disease (HCC)   COPD (chronic obstructive pulmonary disease) (HCC)   Acquired hypothyroidism   Type 2 diabetes mellitus without complications (HCC)   Recurrent major depressive disorder, in partial remission (HCC)   History of CVA (cerebrovascular accident)    Body mass index is 25.48 kg/m.   Acute on chronic HFpEF: Hold IV Lasix because of hypotension.  Monitor BMP, daily weight and urine output.   2D echo on 07/30/2022 showed EF estimated at 40 to 45%, mild LVH, grade 1 diastolic dysfunction, mildly elevated pulmonary artery systolic pressure, mild MR 2D echo in September 2024 showed EF estimated at 50%.   Hypotension: BP down to 87/45.  Discontinue IV Lasix. Continue to monitor BP.   Acute on  chronic hypoxemic respiratory failure: Improving.  Oxygen therapy has been weaned down to 4.5 L/min via nasal cannula.  She uses 4 L oxygen at baseline.    AKI on CKD stage IIIa: Creatinine is stable. 1.86-1.66-1.68.  This probably from cardiorenal syndrome.  Monitor BMP. Baseline creatinine around 1.3   Paroxysmal atrial fibrillation/atrial flutter, history of stroke: Continue Eliquis   Type 2 diabetes mellitus: Continue correctional scale NovoLog.   General weakness: Daughter requested PT evaluation because she wants her to be able to transfer from bed to chair or chair to bed.   Comorbidities include hypothyroidism, recurrent major depression, hypertension, cognitive decline, ? dementia   Plan discussed with [name], daughter, at the bedside  Diet Order             Diet heart healthy/carb modified Room service appropriate? Yes; Fluid consistency: Thin  Diet effective now                            Consultants: None  Procedures: None    Medications:    apixaban  2.5 mg Oral BID   arformoterol  15 mcg Nebulization BID   atorvastatin  80 mg Oral Daily   budesonide  0.25 mg Nebulization BID   furosemide  40 mg Intravenous BID   insulin aspart  0-15 Units Subcutaneous TID WC   insulin aspart  0-5 Units Subcutaneous QHS   levothyroxine  50 mcg Oral QAC breakfast   pantoprazole  40 mg Oral  BID   polyethylene glycol  17 g Oral Daily   potassium chloride  20 mEq Oral Daily   senna-docusate  2 tablet Oral Daily   sertraline  50 mg Oral Daily   Continuous Infusions:   Anti-infectives (From admission, onward)    None              Family Communication/Anticipated D/C date and plan/Code Status   DVT prophylaxis: apixaban (ELIQUIS) tablet 2.5 mg Start: 07/30/23 2330 apixaban (ELIQUIS) tablet 2.5 mg     Code Status: Limited: Do not attempt resuscitation (DNR) -DNR-LIMITED -Do Not Intubate/DNI   Family Communication: Plan discussed with  [name], daughter, at the bedside Disposition Plan: Plan to discharge to SNF   Status is: Inpatient Remains inpatient appropriate because: CHF exacerbation       Subjective:   Interval events noted.  She has no complaints.  She feels better.  No shortness of breath or chest pain.  [name], daughter, was at the bedside  Objective:    Vitals:   08/02/23 0314 08/02/23 1000 08/02/23 1300 08/02/23 1337  BP: (!) 99/56 (!) 102/57 (!) 87/45 (!) 87/45  Pulse: 83 84 84 84  Resp: 20  18   Temp: 98 F (36.7 C) 97.7 F (36.5 C)  (!) 97.5 F (36.4 C)  TempSrc:  Oral  Oral  SpO2: 96% 97% 94% 94%  Weight:      Height:       No data found.   Intake/Output Summary (Last 24 hours) at 08/02/2023 1347 Last data filed at 08/02/2023 0655 Gross per 24 hour  Intake 120 ml  Output 950 ml  Net -830 ml   Filed Weights   07/30/23 1542 08/01/23 0500  Weight: 51.7 kg 53.4 kg    Exam:  GEN: NAD SKIN: Warm and dry EYES: No pallor or icterus ENT: MMM CV: RRR PULM: CTA B ABD: soft, ND, NT, +BS CNS: AAO x 2, non focal EXT: Mild arthritis has improved.  Bilateral leg edema.  Chronic wound on the tip of the left big toe with some tenderness at the tip of the left big toe.        Data Reviewed:   I have personally reviewed following labs and imaging studies:  Labs: Labs show the following:   Basic Metabolic Panel: Recent Labs  Lab 07/30/23 1546 07/31/23 0520 08/01/23 0515 08/02/23 0508  NA 138 138 140 139  K 5.1 4.3 4.1 3.9  CL 110 109 109 109  CO2 17* 20* 23 19*  GLUCOSE 109* 105* 96 102*  BUN 52* 52* 51* 52*  CREATININE 1.86* 1.79* 1.66* 1.68*  CALCIUM 8.8* 8.4* 8.6* 8.7*  MG  --   --  2.3  --    GFR Estimated Creatinine Clearance: 16.3 mL/min (A) (by C-G formula based on SCr of 1.68 mg/dL (H)). Liver Function Tests: No results for input(s): "AST", "ALT", "ALKPHOS", "BILITOT", "PROT", "ALBUMIN" in the last 168 hours. No results for input(s): "LIPASE", "AMYLASE" in  the last 168 hours. No results for input(s): "AMMONIA" in the last 168 hours. Coagulation profile Recent Labs  Lab 07/30/23 1546  INR 1.4*    CBC: Recent Labs  Lab 07/30/23 1546 07/31/23 0520  WBC 11.8* 6.2  HGB 14.9 12.1  HCT 49.7* 40.2  MCV 85.0 81.5  PLT 178 160   Cardiac Enzymes: No results for input(s): "CKTOTAL", "CKMB", "CKMBINDEX", "TROPONINI" in the last 168 hours. BNP (last 3 results) No results for input(s): "PROBNP" in the last 8760  hours. CBG: Recent Labs  Lab 08/01/23 1745 08/01/23 2120 08/02/23 0825 08/02/23 1146 08/02/23 1337  GLUCAP 103* 90 125* 126* 105*   D-Dimer: No results for input(s): "DDIMER" in the last 72 hours. Hgb A1c: No results for input(s): "HGBA1C" in the last 72 hours. Lipid Profile: No results for input(s): "CHOL", "HDL", "LDLCALC", "TRIG", "CHOLHDL", "LDLDIRECT" in the last 72 hours. Thyroid function studies: No results for input(s): "TSH", "T4TOTAL", "T3FREE", "THYROIDAB" in the last 72 hours.  Invalid input(s): "FREET3" Anemia work up: No results for input(s): "VITAMINB12", "FOLATE", "FERRITIN", "TIBC", "IRON", "RETICCTPCT" in the last 72 hours. Sepsis Labs: Recent Labs  Lab 07/30/23 1546 07/30/23 2352 07/31/23 0520  PROCALCITON  --  <0.10  --   WBC 11.8*  --  6.2  LATICACIDVEN  --  1.2  --     Microbiology Recent Results (from the past 240 hours)  Resp panel by RT-PCR (RSV, Flu A&B, Covid) Anterior Nasal Swab     Status: None   Collection Time: 07/30/23  6:07 PM   Specimen: Anterior Nasal Swab  Result Value Ref Range Status   SARS Coronavirus 2 by RT PCR NEGATIVE NEGATIVE Final    Comment: (NOTE) SARS-CoV-2 target nucleic acids are NOT DETECTED.  The SARS-CoV-2 RNA is generally detectable in upper respiratory specimens during the acute phase of infection. The lowest concentration of SARS-CoV-2 viral copies this assay can detect is 138 copies/mL. A negative result does not preclude SARS-Cov-2 infection and  should not be used as the sole basis for treatment or other patient management decisions. A negative result may occur with  improper specimen collection/handling, submission of specimen other than nasopharyngeal swab, presence of viral mutation(s) within the areas targeted by this assay, and inadequate number of viral copies(<138 copies/mL). A negative result must be combined with clinical observations, patient history, and epidemiological information. The expected result is Negative.  Fact Sheet for Patients:  BloggerCourse.com  Fact Sheet for Healthcare Providers:  SeriousBroker.it  This test is no t yet approved or cleared by the [place] FDA and  has been authorized for detection and/or diagnosis of SARS-CoV-2 by FDA under an Emergency Use Authorization (EUA). This EUA will remain  in effect (meaning this test can be used) for the duration of the COVID-19 declaration under Section 564(b)(1) of the Act, 21 U.S.C.section 360bbb-3(b)(1), unless the authorization is terminated  or revoked sooner.       Influenza A by PCR NEGATIVE NEGATIVE Final   Influenza B by PCR NEGATIVE NEGATIVE Final    Comment: (NOTE) The Xpert Xpress SARS-CoV-2/FLU/RSV plus assay is intended as an aid in the diagnosis of influenza from Nasopharyngeal swab specimens and should not be used as a sole basis for treatment. Nasal washings and aspirates are unacceptable for Xpert Xpress SARS-CoV-2/FLU/RSV testing.  Fact Sheet for Patients: BloggerCourse.com  Fact Sheet for Healthcare Providers: SeriousBroker.it  This test is not yet approved or cleared by the [place] FDA and has been authorized for detection and/or diagnosis of SARS-CoV-2 by FDA under an Emergency Use Authorization (EUA). This EUA will remain in effect (meaning this test can be used) for the duration of the COVID-19 declaration  under Section 564(b)(1) of the Act, 21 U.S.C. section 360bbb-3(b)(1), unless the authorization is terminated or revoked.     Resp Syncytial Virus by PCR NEGATIVE NEGATIVE Final    Comment: (NOTE) Fact Sheet for Patients: BloggerCourse.com  Fact Sheet for Healthcare Providers: SeriousBroker.it  This test is not yet approved or cleared by the  [place] [employer] trader and has been authorized for detection and/or diagnosis of SARS-CoV-2 by FDA under an TEFL teacher (EUA). This EUA will remain in effect (meaning this test can be used) for the duration of the COVID-19 declaration under Section 564(b)(1) of the Act, 21 U.S.C. section 360bbb-3(b)(1), unless the authorization is terminated or revoked.  Performed at [facility], [address] [place], [place] [address]     Procedures and diagnostic studies:  ECHOCARDIOGRAM COMPLETE Result Date: 07/31/2023    ECHOCARDIOGRAM REPORT   Patient Name:   Jacqueline [name] Date of Exam: 07/31/2023 Medical Rec #:  [id number]     Height:       57.0 in Accession #:    [id number]    Weight:       114.0 lb Date of Birth:  09/22/1932     BSA:          1.416 m Patient Age:    89 years      BP:           103/65 mmHg Patient Gender: F             HR:           86 bpm. Exam Location:  [facility] Procedure: 2D Echo, Cardiac Doppler and Color Doppler (Both Spectral and Color            Flow Doppler were utilized during procedure). Indications:     Acute Respiratory Distress  History:         Patient has prior history of Echocardiogram examinations, most                  recent 11/27/2021. CHF and Cardiomyopathy, Previous Myocardial                  Infarction, COPD and Stroke, Arrythmias:Atrial Fibrillation;                  Risk Factors:Hypertension, Diabetes and Dyslipidemia. CKD.  Sonographer:     Mikki Harbor Referring Phys:  [id number] Andris Baumann Diagnosing Phys: Alwyn Pea MD  Sonographer  Comments: Image acquisition challenging due to respiratory motion. IMPRESSIONS  1. Left ventricular ejection fraction, by estimation, is 40 to 45%. The left ventricle has mildly decreased function. The left ventricle demonstrates global hypokinesis. There is mild asymmetric left ventricular hypertrophy of the inferior and basal segments. Left ventricular diastolic parameters are consistent with Grade I diastolic dysfunction (impaired relaxation).  2. Right ventricular systolic function is moderately reduced. The right ventricular size is moderately enlarged. There is mildly elevated pulmonary artery systolic pressure.  3. The mitral valve is grossly normal. Mild mitral valve regurgitation.  4. The aortic valve is normal in structure. Aortic valve regurgitation is mild. FINDINGS  Left Ventricle: Left ventricular ejection fraction, by estimation, is 40 to 45%. The left ventricle has mildly decreased function. The left ventricle demonstrates global hypokinesis. Strain imaging was not performed. The left ventricular internal cavity  size was normal in size. There is mild asymmetric left ventricular hypertrophy of the inferior and basal segments. Left ventricular diastolic parameters are consistent with Grade I diastolic dysfunction (impaired relaxation). Right Ventricle: The right ventricular size is moderately enlarged. No increase in right ventricular wall thickness. Right ventricular systolic function is moderately reduced. There is mildly elevated pulmonary artery systolic pressure. The tricuspid regurgitant velocity is 2.71 m/s, and with an assumed right atrial pressure of 15 mmHg, the estimated right ventricular systolic pressure is 44.4  mmHg. Left Atrium: Left atrial size was normal in size. Right Atrium: Right atrial size was normal in size. Pericardium: There is no evidence of pericardial effusion. Mitral Valve: The mitral valve is grossly normal. Mild to moderate mitral annular calcification. Mild mitral valve  regurgitation. MV peak gradient, 4.9 mmHg. The mean mitral valve gradient is 2.0 mmHg. Tricuspid Valve: The tricuspid valve is normal in structure. Tricuspid valve regurgitation is mild. Aortic Valve: The aortic valve is normal in structure. Aortic valve regurgitation is mild. Aortic valve mean gradient measures 3.0 mmHg. Aortic valve peak gradient measures 5.8 mmHg. Aortic valve area, by VTI measures 1.85 cm. Pulmonic Valve: The pulmonic valve was normal in structure. Pulmonic valve regurgitation is mild to moderate. Aorta: The ascending aorta was not well visualized. IAS/Shunts: No atrial level shunt detected by color flow Doppler. Additional Comments: 3D imaging was not performed.  LEFT VENTRICLE PLAX 2D LVIDd:         4.30 cm   Diastology LVIDs:         3.30 cm   LV e' lateral:   9.36 cm/s LV PW:         1.30 cm   LV E/e' lateral: 11.3 LV IVS:        1.00 cm LVOT diam:     2.00 cm LV SV:         39 LV SV Index:   28 LVOT Area:     3.14 cm  RIGHT VENTRICLE RV Basal diam:  3.95 cm RV Mid diam:    5.20 cm LEFT ATRIUM             Index        RIGHT ATRIUM           Index LA diam:        4.10 cm 2.89 cm/m   RA Area:     20.10 cm LA Vol (A2C):   62.8 ml 44.34 ml/m  RA Volume:   61.90 ml  43.70 ml/m LA Vol (A4C):   49.0 ml 34.59 ml/m LA Biplane Vol: 56.1 ml 39.61 ml/m  AORTIC VALVE                    PULMONIC VALVE AV Area (Vmax):    1.95 cm     PV Vmax:       0.76 m/s AV Area (Vmean):   1.80 cm     PV Peak grad:  2.3 mmHg AV Area (VTI):     1.85 cm AV Vmax:           120.67 cm/s AV Vmean:          78.367 cm/s AV VTI:            0.212 m AV Peak Grad:      5.8 mmHg AV Mean Grad:      3.0 mmHg LVOT Vmax:         74.83 cm/s LVOT Vmean:        44.833 cm/s LVOT VTI:          0.125 m LVOT/AV VTI ratio: 0.59  AORTA Ao Root diam: 3.20 cm Ao Asc diam:  3.30 cm MITRAL VALVE                TRICUSPID VALVE MV Area (PHT): 3.72 cm     TR Peak grad:   29.4 mmHg MV Area VTI:   1.68 cm     TR Vmax:  271.00 cm/s MV  Peak grad:  4.9 mmHg MV Mean grad:  2.0 mmHg     SHUNTS MV Vmax:       1.11 m/s     Systemic VTI:  0.12 m MV Vmean:      65.7 cm/s    Systemic Diam: 2.00 cm MV Decel Time: 204 msec MV E velocity: 106.00 cm/s Alwyn Pea MD Electronically signed by Alwyn Pea MD Signature Date/Time: 07/31/2023/10:02:24 PM    Final                LOS: 3 days   Dallon Dacosta  [facility]   Pager on [email or web address]. If 7PM-7AM, please contact night-coverage at [email or web address]     08/02/2023, 1:47 PM

## 2023-08-02 NOTE — Plan of Care (Signed)

## 2023-08-02 NOTE — Progress Notes (Signed)
SUBJECTIVE: Patient is lying in bed comfortable denies any chest pain or shortness of breath.   Vitals:   08/02/23 0314 08/02/23 1000 08/02/23 1300 08/02/23 1337  BP: (!) 99/56 (!) 102/57 (!) 87/45 (!) 87/45  Pulse: 83 84 84 84  Resp: 20  18   Temp: 98 F (36.7 C) 97.7 F (36.5 C)  (!) 97.5 F (36.4 C)  TempSrc:  Oral  Oral  SpO2: 96% 97% 94% 94%  Weight:      Height:        Intake/Output Summary (Last 24 hours) at 08/02/2023 1414 Last data filed at 08/02/2023 0655 Gross per 24 hour  Intake 120 ml  Output 950 ml  Net -830 ml    LABS: Basic Metabolic Panel: Recent Labs    08/01/23 0515 08/02/23 0508  NA 140 139  K 4.1 3.9  CL 109 109  CO2 23 19*  GLUCOSE 96 102*  BUN 51* 52*  CREATININE 1.66* 1.68*  CALCIUM 8.6* 8.7*  MG 2.3  --    Liver Function Tests: No results for input(s): "AST", "ALT", "ALKPHOS", "BILITOT", "PROT", "ALBUMIN" in the last 72 hours. No results for input(s): "LIPASE", "AMYLASE" in the last 72 hours. CBC: Recent Labs    07/30/23 1546 07/31/23 0520  WBC 11.8* 6.2  HGB 14.9 12.1  HCT 49.7* 40.2  MCV 85.0 81.5  PLT 178 160   Cardiac Enzymes: No results for input(s): "CKTOTAL", "CKMB", "CKMBINDEX", "TROPONINI" in the last 72 hours. BNP: Invalid input(s): "POCBNP" D-Dimer: No results for input(s): "DDIMER" in the last 72 hours. Hemoglobin A1C: No results for input(s): "HGBA1C" in the last 72 hours. Fasting Lipid Panel: No results for input(s): "CHOL", "HDL", "LDLCALC", "TRIG", "CHOLHDL", "LDLDIRECT" in the last 72 hours. Thyroid Function Tests: No results for input(s): "TSH", "T4TOTAL", "T3FREE", "THYROIDAB" in the last 72 hours.  Invalid input(s): "FREET3" Anemia Panel: No results for input(s): "VITAMINB12", "FOLATE", "FERRITIN", "TIBC", "IRON", "RETICCTPCT" in the last 72 hours.   PHYSICAL EXAM General: Well developed, well nourished, in no acute distress HEENT:  Normocephalic and atramatic Neck:  No JVD.  Lungs: Clear  bilaterally to auscultation and percussion. Heart: HRRR . Normal S1 and S2 without gallops or murmurs.  Abdomen: Bowel sounds are positive, abdomen soft and non-tender  Msk:  Back normal, normal gait. Normal strength and tone for age. Extremities: No clubbing, cyanosis or edema.   Neuro: Alert and oriented X 3. Psych:  Good affect, responds appropriately  TELEMETRY: Normal sinus rhythm  ASSESSMENT AND PLAN:    ICD-10-CM   1. Acute respiratory failure with hypoxia (HCC)  J96.01     2. Acute on chronic congestive heart failure, unspecified heart failure type (HCC)  I50.9       Active Problems:   Acute on chronic heart failure with preserved ejection fraction (HFpEF) (HCC)   Hypertension   Acquired hypothyroidism   Acute renal failure superimposed on stage 3a chronic kidney disease (HCC)   Chronic atrial fibrillation (HCC)   Type 2 diabetes mellitus without complications (HCC)   Dilated idiopathic cardiomyopathy (HCC)   Recurrent major depressive disorder, in partial remission (HCC)   Acute on chronic respiratory failure with hypoxia (HCC)   Peripheral cyanosis   Hypotension   COPD (chronic obstructive pulmonary disease) (HCC)   History of CVA (cerebrovascular accident)    Plan Shortness of breath related to heart failure continue diuretic therapy supplemental oxygen as necessary agree with advanced heart failure consult by Dr. Gala Romney Nonischemic cardiomyopathy ejection fraction somewhat  improved at around 45 to 50% continue current medical therapy Hypoxemia multifactorial continue supplemental oxygen consider pulmonary input Acute on chronic renal insufficiency stage III recommend adequate renal perfusion consider nephrology input COPD chronic with hypoxemia supplemental oxygen inhalers consider pulmonary input Diabetes type 2 continue current medical therapy Chronic recurrent hypotension hold ARB until diuresis with Lasix Echocardiogram showed left ventricular function  showed LVEF 45% on this admission.                    Jacqueline Blackwater, MD, [facility] 08/02/2023 2:14 PM

## 2023-08-02 NOTE — Evaluation (Signed)
Physical Therapy Evaluation Patient Details Name: Jacqueline [name] MRN: [id number] DOB: 1933/01/31 Today's Date: 08/02/2023  History of Present Illness  Jacqueline [name] is a 89 y.o. female  with medical history significant for NSTEMI 11/2021, T2DM, paroxysmal atrial fibrillation on Eliquis, CVA 11/2021, HTN, hypothyroidism, anemia, COPD on home O2 at 4 L, HFrEF with improved EF secondary to NICM, last EF 50% in September 2024 (previously 20 to 25% 2023).  She was referred from the cardiologist office to the emergency department because of concerns of CHF exacerbation.  Her daughter reported that patient was found to have bluish discoloration of her fingers up to her knuckles and increasing swelling of her lower extremities.  She had also become increasingly short of breath.  Clinical Impression  Pt received in bed with daughter by her side agreeable to PT Interventions. Daughter is very supportive and is with the pt every day during the day. As per daughter pt either rest in bed or is W/C bound. Pt is a  [name] resident where she is able to transfer in and Out of bed and W/C with one person assist. Pt uses toilet with one person assist. PT assessment revealed pt is needs min assist for Rolling and max for supine to sit. Transfers with Min assist of 1 using FWW. Pt is close to her baseline. Pt moves slow but is steady using FWW. PT will continue in acute and beyond acute at [name] to return to PLOF.       If plan is discharge home, recommend the following: A lot of help with walking and/or transfers;A lot of help with bathing/dressing/bathroom;Assistance with cooking/housework;Assistance with feeding;Direct supervision/assist for medications management;Assist for transportation;Help with stairs or ramp for entrance;Supervision due to cognitive status   Can travel by private vehicle   No    Equipment Recommendations None recommended by PT  Recommendations for Other Services       Functional  Status Assessment Patient has had a recent decline in their functional status and demonstrates the ability to make significant improvements in function in a reasonable and predictable amount of time.     Precautions / Restrictions Precautions Precautions: Fall Restrictions Weight Bearing Restrictions Per Provider Order: No      Mobility  Bed Mobility Overal bed mobility: Needs Assistance Bed Mobility: Rolling, Supine to Sit Rolling: Min assist, Contact guard assist   Supine to sit: Max assist     General bed mobility comments: slow and needs VC for hand placement.    Transfers Overall transfer level: Needs assistance Equipment used: Rolling walker (2 wheels) Transfers: Sit to/from Stand, Bed to chair/wheelchair/BSC Sit to Stand: Min assist   Step pivot transfers: Min assist       General transfer comment: Relies heavily on the FWW. moves slow.    Ambulation/Gait               General Gait Details: Unable.  Stairs            Wheelchair Mobility     Tilt Bed    Modified Rankin (Stroke Patients Only)       Balance Overall balance assessment: Needs assistance Sitting-balance support: Bilateral upper extremity supported Sitting balance-Leahy Scale: Good Sitting balance - Comments: No LOB noted.   Standing balance support: Bilateral upper extremity supported Standing balance-Leahy Scale: Fair Standing balance comment: NO LOB but depends heavily on FWW and VC.  Pertinent Vitals/Pain Pain Assessment Pain Assessment: No/denies pain    Home Living Family/patient expects to be discharged to:: Skilled nursing facility                   Additional Comments: Pt lives at [facility] and want to return there.    Prior Function Prior Level of Function : Needs assist             Mobility Comments: As per daughter, At [facility]  Pt needs Min assit for supine ot sit and sit to supine and for STS.  Transfer to Toilet with CGA using FWW. ADLs Comments: Staff and duaghter assists with ADLs, meals, and meds.     Extremity/Trunk Assessment   Upper Extremity Assessment Upper Extremity Assessment: Generalized weakness    Lower Extremity Assessment Lower Extremity Assessment: Generalized weakness       Communication   Communication Communication: No apparent difficulties Factors Affecting Communication: Hearing impaired    Cognition Arousal: Lethargic                               Following commands: Intact       Cueing Cueing Techniques: Verbal cues     General Comments      Exercises     Assessment/Plan    PT Assessment Patient needs continued PT services  PT Problem List         PT Treatment Interventions Gait training;Therapeutic activities;Therapeutic exercise;Patient/family education    PT Goals (Current goals can be found in the Care Plan section)  Acute Rehab PT Goals Patient Stated Goal: " return opt [facility]" PT Goal Formulation: With family Time For Goal Achievement: 08/16/23 Potential to Achieve Goals: Good    Frequency Min 2X/week     Co-evaluation               AM-PAC PT "6 Clicks" Mobility  Outcome Measure Help needed turning from your back to your side while in a flat bed without using bedrails?: A Little Help needed moving from lying on your back to sitting on the side of a flat bed without using bedrails?: A Lot Help needed moving to and from a bed to a chair (including a wheelchair)?: A Lot Help needed standing up from a chair using your arms (e.g., wheelchair or bedside chair)?: A Little Help needed to walk in hospital room?: A Lot Help needed climbing 3-5 steps with a railing? : Total 6 Click Score: 13    End of Session Equipment Utilized During Treatment: Gait belt;Oxygen Activity Tolerance: Patient limited by lethargy;Patient limited by fatigue Patient left:  (BSC with daughter by her side.) Nurse  Communication: Mobility status PT Visit Diagnosis: Muscle weakness (generalized) (M62.81)    Time: 1610-9604 PT Time Calculation (min) (ACUTE ONLY): 16 min   Charges:   PT Evaluation $PT Eval Low Complexity: 1 Low   PT General Charges $$ ACUTE PT VISIT: 1 Visit         Jacqueline [name] PT DPT 5:04 PM,08/02/23

## 2023-08-02 NOTE — Consult Note (Signed)
Please note that the [facility] nursing team is utilizing a standardized work plan to manage patient consults. We are triaging consults and will try to see the patients within 48 hours. Wound photos in the patient's chart allow [place] to consult on the patient in the most efficient and timely manner.    Guneet Delpino [facility], CNS, [employer] [phone]

## 2023-08-03 ENCOUNTER — Other Ambulatory Visit: Payer: Self-pay

## 2023-08-03 ENCOUNTER — Emergency Department: Payer: Medicare Other

## 2023-08-03 ENCOUNTER — Emergency Department
Admission: EM | Admit: 2023-08-03 | Discharge: 2023-08-03 | Disposition: A | Discharge status: Skilled Nursing Facility | Payer: Medicare Other | Attending: Emergency Medicine | Admitting: Emergency Medicine

## 2023-08-03 DIAGNOSIS — Z9981 Dependence on supplemental oxygen: Secondary | ICD-10-CM | POA: Diagnosis not present

## 2023-08-03 DIAGNOSIS — Z8679 Personal history of other diseases of the circulatory system: Secondary | ICD-10-CM

## 2023-08-03 DIAGNOSIS — R0603 Acute respiratory distress: Secondary | ICD-10-CM | POA: Insufficient documentation

## 2023-08-03 DIAGNOSIS — N179 Acute kidney failure, unspecified: Secondary | ICD-10-CM

## 2023-08-03 DIAGNOSIS — I5033 Acute on chronic diastolic (congestive) heart failure: Secondary | ICD-10-CM | POA: Diagnosis not present

## 2023-08-03 DIAGNOSIS — I509 Heart failure, unspecified: Secondary | ICD-10-CM | POA: Insufficient documentation

## 2023-08-03 LAB — CBC WITH DIFFERENTIAL/PLATELET
Abs Immature Granulocytes: 0.01 10*3/uL (ref 0.00–0.07)
Basophils Absolute: 0 10*3/uL (ref 0.0–0.1)
Basophils Relative: 0 %
Eosinophils Absolute: 0 10*3/uL (ref 0.0–0.5)
Eosinophils Relative: 1 %
HCT: 47.3 % — ABNORMAL HIGH (ref 36.0–46.0)
Hemoglobin: 14 g/dL (ref 12.0–15.0)
Immature Granulocytes: 0 %
Lymphocytes Relative: 26 %
Lymphs Abs: 1.6 10*3/uL (ref 0.7–4.0)
MCH: 24.3 pg — ABNORMAL LOW (ref 26.0–34.0)
MCHC: 29.6 g/dL — ABNORMAL LOW (ref 30.0–36.0)
MCV: 82 fL (ref 80.0–100.0)
Monocytes Absolute: 0.5 10*3/uL (ref 0.1–1.0)
Monocytes Relative: 8 %
Neutro Abs: 4.2 10*3/uL (ref 1.7–7.7)
Neutrophils Relative %: 65 %
Platelets: 177 10*3/uL (ref 150–400)
RBC: 5.77 MIL/uL — ABNORMAL HIGH (ref 3.87–5.11)
RDW: 21.9 % — ABNORMAL HIGH (ref 11.5–15.5)
Smear Review: NORMAL
WBC: 6.4 10*3/uL (ref 4.0–10.5)
nRBC: 0 % (ref 0.0–0.2)

## 2023-08-03 LAB — BASIC METABOLIC PANEL
Anion gap: 8 (ref 5–15)
BUN: 48 mg/dL — ABNORMAL HIGH (ref 8–23)
CO2: 21 mmol/L — ABNORMAL LOW (ref 22–32)
Calcium: 8.6 mg/dL — ABNORMAL LOW (ref 8.9–10.3)
Chloride: 107 mmol/L (ref 98–111)
Creatinine, Ser: 1.55 mg/dL — ABNORMAL HIGH (ref 0.44–1.00)
GFR, Estimated: 32 mL/min — ABNORMAL LOW (ref >60)
Glucose, Bld: 108 mg/dL — ABNORMAL HIGH (ref 70–99)
Potassium: 4.1 mmol/L (ref 3.5–5.1)
Sodium: 136 mmol/L (ref 135–145)

## 2023-08-03 LAB — COMPREHENSIVE METABOLIC PANEL
ALT: 13 U/L (ref 0–44)
AST: 20 U/L (ref 15–41)
Albumin: 3.5 g/dL (ref 3.5–5.0)
Alkaline Phosphatase: 60 U/L (ref 38–126)
Anion gap: 7 (ref 5–15)
BUN: 50 mg/dL — ABNORMAL HIGH (ref 8–23)
CO2: 25 mmol/L (ref 22–32)
Calcium: 8.8 mg/dL — ABNORMAL LOW (ref 8.9–10.3)
Chloride: 104 mmol/L (ref 98–111)
Creatinine, Ser: 1.66 mg/dL — ABNORMAL HIGH (ref 0.44–1.00)
GFR, Estimated: 29 mL/min — ABNORMAL LOW (ref >60)
Glucose, Bld: 113 mg/dL — ABNORMAL HIGH (ref 70–99)
Potassium: 4.8 mmol/L (ref 3.5–5.1)
Sodium: 136 mmol/L (ref 135–145)
Total Bilirubin: 0.7 mg/dL (ref 0.0–1.2)
Total Protein: 7.3 g/dL (ref 6.5–8.1)

## 2023-08-03 LAB — BLOOD GAS, VENOUS

## 2023-08-03 LAB — GLUCOSE, CAPILLARY
Glucose-Capillary: 116 mg/dL — ABNORMAL HIGH (ref 70–99)
Glucose-Capillary: 110 mg/dL — ABNORMAL HIGH (ref 70–99)

## 2023-08-03 LAB — TROPONIN I (HIGH SENSITIVITY)
Troponin I (High Sensitivity): 50 ng/L — ABNORMAL HIGH (ref <18)
Troponin I (High Sensitivity): 54 ng/L — ABNORMAL HIGH (ref <18)

## 2023-08-03 LAB — BRAIN NATRIURETIC PEPTIDE: B Natriuretic Peptide: 1263.5 pg/mL — ABNORMAL HIGH (ref 0.0–100.0)

## 2023-08-03 MED ORDER — TORSEMIDE 20 MG PO TABS: ORAL_TABLET | ORAL | Status: DC

## 2023-08-03 MED ORDER — TORSEMIDE 20 MG PO TABS
20.0000 mg | ORAL_TABLET | ORAL | Status: DC
  Administered 2023-08-03: 20 mg via ORAL
  Filled 2023-08-03: qty 1

## 2023-08-03 MED ORDER — TORSEMIDE 20 MG PO TABS: 40.0000 mg | ORAL_TABLET | ORAL | Status: DC

## 2023-08-03 MED ORDER — TORSEMIDE 20 MG PO TABS: 20.0000 mg | ORAL_TABLET | Freq: Every day | ORAL | Status: DC

## 2023-08-03 NOTE — ED Notes (Signed)
Per MD Bradler, pt provided with meal tray and water at this time.

## 2023-08-03 NOTE — Consult Note (Signed)
WOC Nurse Consult Note: Reason for Consult: sacral wound and toe wound, toe appears to be fungal nail, no topical wound care needed  Wound type: Stage 3 Pressure injury; right sacral region Incidentally noted scabbed area on the right thigh Pressure Injury POA: Yes Measurement: see nursing flow sheet Wound bed: Buttock/sacral wound 100% clean, pink, moist Scabbed on there thigh Drainage (amount, consistency, odor) scant Periwound: erythema to the buttock wound Dressing procedure/placement/frequency: Cleanse buttock/sacral wound with saline, pat dry. Apply hydrogel [place] # [phone]) to wound bed, top with dry dressing. Change daily.  Cover scabbed area with single layer of xeroform gauze, top with foam. Change every other day Turn and reposition per hospital policy     Re consult if needed, will not follow at this time. Thanks  Laquinda Moller [employer], RN,CWOCN, CNS, CWON-AP [phone])

## 2023-08-03 NOTE — ED Provider Notes (Signed)
[facility] Provider Note   Event Date/Time   First MD Initiated Contact with Patient 08/03/23 1501     (approximate) History  Respiratory Distress  HPI Jacqueline [name] is a 89 y.o. female with a past medical history of chronic atrial fibrillation and CHF who presents after being discharged earlier from our hospital due to severe pulmonary edema after EMS services found patient to be hypoxic on her normal 4 L.  Patient was reportedly showing no signs of respiratory distress however oxygen levels were reading at 70 and she returned to the hospital due to this hypoxia.  Patient has been on 4 L nasal cannula with pulse ox in the nostril and has shown 100%.  Patient has no complaints ROS: Patient currently denies any vision changes, tinnitus, difficulty speaking, facial droop, sore throat, chest pain, shortness of breath, abdominal pain, nausea/vomiting/diarrhea, dysuria, or weakness/numbness/paresthesias in any extremity   Physical Exam  Triage Vital Signs: ED Triage Vitals  Encounter Vitals Group     BP 08/03/23 1502 98/64     Systolic BP Percentile --      Diastolic BP Percentile --      Pulse Rate 08/03/23 1502 87     Resp 08/03/23 1502 (!) 22     Temp 08/03/23 1507 97.6 F (36.4 C)     Temp Source 08/03/23 1507 Axillary     SpO2 08/03/23 1501 90 %     Weight 08/03/23 1503 120 lb 2.4 oz (54.5 kg)     Height 08/03/23 1503 4\' 9"  (1.448 m)     Head Circumference --      Peak Flow --      Pain Score 08/03/23 1503 0     Pain Loc --      Pain Education --      Exclude from Growth Chart --    Most recent vital signs: Vitals:   08/03/23 1530 08/03/23 1700  BP: (!) 101/49 103/62  Pulse: 69 91  Resp: 20 19  Temp:    SpO2: 93% 95%   General: Awake, oriented x4. CV:  Good peripheral perfusion.  Resp:  Normal effort.  4 L nasal cannula in place.  Mild bilateral Rales over lower lung fields Abd:  No distention.  Other:  Elderly [place] Asian overweight female  resting comfortably in no acute distress ED Results / Procedures / Treatments  Labs (all labs ordered are listed, but only abnormal results are displayed) Labs Reviewed  BRAIN NATRIURETIC PEPTIDE - Abnormal; Notable for the following components:      Result Value   B Natriuretic Peptide 1,263.5 (*)    All other components within normal limits  COMPREHENSIVE METABOLIC PANEL - Abnormal; Notable for the following components:   Glucose, Bld 113 (*)    BUN 50 (*)    Creatinine, Ser 1.66 (*)    Calcium 8.8 (*)    GFR, Estimated 29 (*)    All other components within normal limits  CBC WITH DIFFERENTIAL/PLATELET - Abnormal; Notable for the following components:   RBC 5.77 (*)    HCT 47.3 (*)    MCH 24.3 (*)    MCHC 29.6 (*)    RDW 21.9 (*)    All other components within normal limits  BLOOD GAS, VENOUS - Abnormal; Notable for the following components:   pO2, Ven <31 (*)    All other components within normal limits  TROPONIN I (HIGH SENSITIVITY) - Abnormal; Notable for the following components:  Troponin I (High Sensitivity) 50 (*)    All other components within normal limits  TROPONIN I (HIGH SENSITIVITY) - Abnormal; Notable for the following components:   Troponin I (High Sensitivity) 54 (*)    All other components within normal limits   EKG ED ECG REPORT I, Merwyn Katos, the attending physician, personally viewed and interpreted this ECG. Date: 08/03/2023 EKG Time: 1507 Rate: 91 Rhythm: Atrial fibrillation QRS Axis: normal Intervals: normal ST/T Wave abnormalities: normal Narrative Interpretation: Rate controlled atrial fibrillation.  No evidence of acute ischemia RADIOLOGY ED MD interpretation: One-view portable chest x-ray interpreted by me shows no evidence of acute abnormalities including no pneumonia, pneumothorax, or widened mediastinum -Agree with radiology assessment Official radiology report(s): DG Chest Port 1 View Result Date: 08/03/2023 CLINICAL DATA:   Shortness of breath.  History of pulmonary edema. EXAM: PORTABLE CHEST 1 VIEW COMPARISON:  Chest radiograph dated 07/30/2023. FINDINGS: No focal consolidation, pleural effusion, or pneumothorax. The cardiac silhouette is within limits. Atherosclerotic calcification of the aorta. Osteopenia with degenerative changes of the spine. No acute osseous pathology. Similar sclerotic lesion of the proximal left humerus. IMPRESSION: No active disease. Electronically Signed   By: Elgie Collard M.D.   On: 08/03/2023 17:55   PROCEDURES: Critical Care performed: No .1-3 Lead EKG Interpretation  Performed by: Merwyn Katos, MD Authorized by: Merwyn Katos, MD     Interpretation: abnormal     ECG rate:  92   ECG rate assessment: normal     Rhythm: atrial fibrillation     Ectopy: none     Conduction: normal    MEDICATIONS ORDERED IN ED: Medications - No data to display IMPRESSION / MDM / ASSESSMENT AND PLAN / ED COURSE  I reviewed the triage vital signs and the nursing notes.                             The patient is on the cardiac monitor to evaluate for evidence of arrhythmia and/or significant heart rate changes. Patient's presentation is most consistent with acute presentation with potential threat to life or bodily function. The patient presents after suffering from shortness of breath, but the immediate cause is not apparent.  Potential causes considered include, but are not limited to, asthma or COPD, congestive heart failure, pulmonary embolism, pneumothorax, coronary syndrome, pneumonia, and pleural effusion.  Despite the evaluation including history, exam, and testing, the cause of the shortness of breath remains unclear. However, during the ED stay, patient's condition improved, and at the time of discharge the shortness of breath is resolved, they are feeling well, and want to go home.  Patient will be discharged with strict return precautions and advice to follow up with primary MD  within 24 hours for further evaluation.   FINAL CLINICAL IMPRESSION(S) / ED DIAGNOSES   Final diagnoses:  Respiratory distress  History of chronic congestive heart failure  Supplemental oxygen dependent   Rx / DC Orders   ED Discharge Orders     None      Note:  This document was prepared using Dragon voice recognition software and may include unintentional dictation errors.   Merwyn Katos, MD 08/03/23 [phone]

## 2023-08-03 NOTE — ED Notes (Signed)
Pts brief changed, pt repositioned, and pure wick reapplied at this time. Pt resting comfortably w/ daughter at bedside.

## 2023-08-03 NOTE — Progress Notes (Signed)
Advanced Heart Failure Rounding Note  Cardiologist: Dr. Darrold Junker  Chief Complaint: Acute on chronic HFmrEF  Subjective:    Daughter at bedside and provides most of the history.   Improving each day. O2 requirement back to baseline. Lower extremity edema resolved. Great appetite this morning.    Objective:   Weight Range: 54.5 kg Body mass index is 26 kg/m.   Vital Signs:   Temp:  [97.5 F (36.4 C)-98.1 F (36.7 C)] 97.7 F (36.5 C) (02/17 0411) Pulse Rate:  [67-87] 73 (02/17 0411) Resp:  [16-20] 20 (02/17 0411) BP: (87-112)/(45-79) 92/65 (02/17 0411) SpO2:  [94 %-97 %] 97 % (02/17 0740) Weight:  [54.5 kg] 54.5 kg (02/17 0500) Last BM Date : 08/02/23  Weight change: Filed Weights   07/30/23 1542 08/01/23 0500 08/03/23 0500  Weight: 51.7 kg 53.4 kg 54.5 kg    Intake/Output:   Intake/Output Summary (Last 24 hours) at 08/03/2023 0804 Last data filed at 08/02/2023 1700 Gross per 24 hour  Intake 480 ml  Output 400 ml  Net 80 ml      Physical Exam    General:  Thin, elderly female Neck: No JVD Cor: Irregular rhythm. No rubs, gallops or murmurs. Lungs: Clear. No distress on 3L [place] Abdomen: Soft, nontender, nondistended.  Extremities: No edema Neuro: Alert. Looks to daughter to answer questions. Follows commands.   Telemetry   Afib 70s-80s   Labs    CBC No results for input(s): "WBC", "NEUTROABS", "HGB", "HCT", "MCV", "PLT" in the last 72 hours. Basic Metabolic Panel Recent Labs    47/82/95 0515 08/02/23 0508 08/03/23 0602  NA 140 139 136  K 4.1 3.9 4.1  CL 109 109 107  CO2 23 19* 21*  GLUCOSE 96 102* 108*  BUN 51* 52* 48*  CREATININE 1.66* 1.68* 1.55*  CALCIUM 8.6* 8.7* 8.6*  MG 2.3  --   --    Liver Function Tests No results for input(s): "AST", "ALT", "ALKPHOS", "BILITOT", "PROT", "ALBUMIN" in the last 72 hours. No results for input(s): "LIPASE", "AMYLASE" in the last 72 hours. Cardiac Enzymes No results for input(s): "CKTOTAL",  "CKMB", "CKMBINDEX", "TROPONINI" in the last 72 hours.  BNP: BNP (last 3 results) Recent Labs    02/22/23 0745 07/30/23 1546  BNP 746.2* 1,404.2*    ProBNP (last 3 results) No results for input(s): "PROBNP" in the last 8760 hours.   D-Dimer No results for input(s): "DDIMER" in the last 72 hours. Hemoglobin A1C No results for input(s): "HGBA1C" in the last 72 hours. Fasting Lipid Panel No results for input(s): "CHOL", "HDL", "LDLCALC", "TRIG", "CHOLHDL", "LDLDIRECT" in the last 72 hours. Thyroid Function Tests No results for input(s): "TSH", "T4TOTAL", "T3FREE", "THYROIDAB" in the last 72 hours.  Invalid input(s): "FREET3"  Other results:   Imaging    No results found.   Medications:     Scheduled Medications:  apixaban  2.5 mg Oral BID   arformoterol  15 mcg Nebulization BID   atorvastatin  80 mg Oral Daily   budesonide  0.25 mg Nebulization BID   insulin aspart  0-15 Units Subcutaneous TID WC   insulin aspart  0-5 Units Subcutaneous QHS   levothyroxine  50 mcg Oral QAC breakfast   pantoprazole  40 mg Oral BID   polyethylene glycol  17 g Oral Daily   potassium chloride  20 mEq Oral Daily   senna-docusate  2 tablet Oral Daily   sertraline  50 mg Oral Daily    Infusions:  PRN Medications: acetaminophen **OR** acetaminophen, HYDROcodone-acetaminophen, ondansetron **OR** ondansetron (ZOFRAN) IV    Assessment/Plan  1. Acute on chronic hypoxic respiratory failure - based on work-up, exacerbation seems to be primarily due to HF - CXR suggestive of chronic PH but otherwise unremarkable - O2 weaned to baseline with diuresis   2. Acute on chronic HF with mildly decreased EF - Echo 06/02/17: EF 30-35% with mild LVH, mild AR, mild/ moderate MR/TR, mild LAE - Echo 01/05/19: EF 55-60% with mild LAE - Echo 11/18/21: EF 20-25% with severe LVH, Grade II DD, mild LAE, mild MR - Echo 11/27/21: EF 40-45% along with mild LVH and mild MR.  - Echo 03/06/23: EF 50% (@ [facility]  [facility]) - Echo this admit: EF 40-45%, RV moderately reduced - Diuresed well with IV lasix. Has been started on po Torsemide 40 mg alternating with 20 mg every other day per [facility]. Agree with regimen. - Keep off Jardiance and ARB at discharge - has allergy to spironolactone & entresto (hypotension)   3. CAD - has h/o NSTEMI but I do not see cath results.  - hs trop ok. No evidence of ACS - continue Eliquis/statin    4: PAF- - Back in AFL now but rate controlled - Continue Eliquis - No role for DC-CV at this point   5. AKI  - suspect due to cardiorenal syndrome - b/l Scr 1.2-1.3, peaked at 1.86. - Cr improved to 1.55 this am - Holding ARB and SGLT2i as above   Planning for discharge today. Has f/u in HF clinic and with [facility] scheduled.  Length of Stay: 4  Makhya Arave N, PA-C  08/03/2023, 8:04 AM  Advanced Heart Failure Team Pager [phone] (M-F; 7a - 5p)  Please contact [facility] for night-coverage after hours (5p -7a ) and weekends on [email or web address]

## 2023-08-03 NOTE — [place] FL2 (Signed)
MEDICAID FL2 LEVEL OF CARE FORM     IDENTIFICATION  Patient Name: Jacqueline [name] Birthdate: January 25, 1933 Sex: female Admission Date (Current Location): 07/30/2023  [facility] and [place] Number:  Chiropodist and Address:  [facility], [address], [place], [place] [address]      Provider Number: [id number]  Attending Physician Name and Address:  Lurene Shadow, MD  Relative Name and Phone Number:  [name] (Daughter)  [phone] [facility])    Current Level of Care: Hospital Recommended Level of Care: Skilled Nursing Facility Prior Approval Number:    Date Approved/Denied:   PASRR Number: [id number]  Discharge Plan: SNF    Current Diagnoses: Patient Active Problem List   Diagnosis Date Noted   Acute on chronic respiratory failure with hypoxia (HCC) 07/30/2023   Peripheral cyanosis 07/30/2023   Hypotension 07/30/2023   COPD (chronic obstructive pulmonary disease) (HCC) 07/30/2023   History of CVA (cerebrovascular accident) 07/30/2023   Sepsis (HCC) 02/23/2023   COVID-19 02/23/2023   Upper respiratory tract infection 02/23/2023   Pressure injury of skin 02/23/2023   CAP (community acquired pneumonia) 02/22/2023   PNA (pneumonia) 02/22/2023   Hx of myocardial infarction 02/13/2023   Hepatic cirrhosis, unspecified hepatic cirrhosis type, unspecified whether ascites present (HCC) 07/13/2022   Recurrent major depressive disorder, in partial remission (HCC) 07/13/2022   Atherosclerosis of aorta (HCC) 07/13/2022   Hyperlipidemia 04/28/2022   Dilated idiopathic cardiomyopathy (HCC) 02/26/2022   Dependence on supplemental oxygen 02/26/2022   Chronic atrial fibrillation (HCC) 11/27/2021   Non-ST elevation myocardial infarction (NSTEMI), subendocardial infarction, subsequent episode of care (HCC) 11/26/2021   Acute renal failure superimposed on stage 3a chronic kidney disease (HCC) 11/26/2021   Depression 11/26/2021   Slurred  speech 11/26/2021   Weakness 11/22/2021   CKD stage 3 due to type 2 diabetes mellitus (HCC) 11/21/2021   Thrombocytopenia (HCC) 07/01/2018   Chronic respiratory failure with hypoxia (HCC) 07/01/2018   Proteinuria 07/01/2018   Age-related osteoporosis without current pathological fracture 08/12/2017   S/P kyphoplasty 07/16/2017   MI, acute, non ST segment elevation (HCC) 06/01/2017   Acute upper back pain 03/05/2017   Carcinoma of unknown primary (HCC) 03/05/2017   Liver lesion 02/27/2017   Hypertension 02/24/2017   GERD (gastroesophageal reflux disease) 02/24/2017   Acquired hypothyroidism 02/24/2017   Hypoglycemia 02/24/2017   Acute on chronic heart failure with preserved ejection fraction (HFpEF) (HCC) 02/05/2017   Bilateral leg edema 01/12/2017   AMD (age related macular degeneration) 08/20/2015   Type 2 diabetes mellitus without complications (HCC) 08/18/2014    Orientation RESPIRATION BLADDER Height & Weight     Self  O2 (023L) Incontinent Weight: 54.5 kg Height:  4\' 9"  (144.8 cm)  BEHAVIORAL SYMPTOMS/MOOD NEUROLOGICAL BOWEL NUTRITION STATUS  Other (Comment) (n/a)  (n/a) Continent Diet (Heart healthy/ Carb modified)  AMBULATORY STATUS COMMUNICATION OF NEEDS Skin   Limited Assist Verbally Other (Comment), PU Stage and Appropriate Care (stage 3 full thickness,)     PU Stage 3 Dressing: Daily                 Personal Care Assistance Level of Assistance  Bathing, Dressing, Feeding Bathing Assistance: Limited assistance Feeding assistance: Limited assistance Dressing Assistance: Limited assistance     Functional Limitations Info  Hearing   Hearing Info: Impaired      SPECIAL CARE FACTORS FREQUENCY  PT (By licensed PT), OT (By licensed OT)     PT Frequency: Min 2x weekly OT Frequency:  Min 2x weekly            Contractures Contractures Info: Not present    Additional Factors Info  Code Status, Allergies Code Status Info: DNR Allergies Info:  Ciprofloxacin, Digoxin And Related, Entresto (Sacubitril-valsartan), Erythromycin, Lopid (Gemfibrozil), Nitrofurantoin, Spironolactone, Cefuroxime, Penicillins           Current Medications (08/03/2023):  This is the current hospital active medication list Current Facility-Administered Medications  Medication Dose Route Frequency Provider Last Rate Last Admin   acetaminophen (TYLENOL) tablet 650 mg  650 mg Oral Q6H PRN Andris Baumann, MD       Or   acetaminophen (TYLENOL) suppository 650 mg  650 mg Rectal Q6H PRN Andris Baumann, MD       apixaban Everlene Balls) tablet 2.5 mg  2.5 mg Oral BID Lindajo Royal V, MD   2.5 mg at 08/03/23 1018   arformoterol (BROVANA) nebulizer solution 15 mcg  15 mcg Nebulization BID Andris Baumann, MD   15 mcg at 08/03/23 0738   atorvastatin (LIPITOR) tablet 80 mg  80 mg Oral Daily Lindajo Royal V, MD   80 mg at 08/03/23 1017   budesonide (PULMICORT) nebulizer solution 0.25 mg  0.25 mg Nebulization BID Lindajo Royal V, MD   0.25 mg at 08/03/23 0737   HYDROcodone-acetaminophen (NORCO/VICODIN) 5-325 MG per tablet 1-2 tablet  1-2 tablet Oral Q4H PRN Andris Baumann, MD       insulin aspart (novoLOG) injection 0-15 Units  0-15 Units Subcutaneous TID WC Lindajo Royal V, MD   2 Units at 08/02/23 1010   insulin aspart (novoLOG) injection 0-5 Units  0-5 Units Subcutaneous QHS Andris Baumann, MD       levothyroxine (SYNTHROID) tablet 50 mcg  50 mcg Oral QAC breakfast Lindajo Royal V, MD   50 mcg at 08/03/23 0609   ondansetron (ZOFRAN) tablet 4 mg  4 mg Oral Q6H PRN Andris Baumann, MD       Or   ondansetron [facility]) injection 4 mg  4 mg Intravenous Q6H PRN Andris Baumann, MD       pantoprazole (PROTONIX) EC tablet 40 mg  40 mg Oral BID Lindajo Royal V, MD   40 mg at 08/03/23 1018   polyethylene glycol (MIRALAX / GLYCOLAX) packet 17 g  17 g Oral Daily Lurene Shadow, MD   17 g at 08/03/23 1018   potassium chloride (KLOR-CON) packet 20 mEq  20 mEq Oral Daily Andris Baumann,  MD   20 mEq at 08/03/23 1018   senna-docusate (Senokot-S) tablet 2 tablet  2 tablet Oral Daily Andris Baumann, MD   2 tablet at 08/03/23 1017   sertraline (ZOLOFT) tablet 50 mg  50 mg Oral Daily Lindajo Royal V, MD   50 mg at 08/03/23 1018   torsemide (DEMADEX) tablet 20 mg  20 mg Oral QODAY Hudson, Caralyn, PA-C   20 mg at 08/03/23 1018   [START ON 08/04/2023] torsemide (DEMADEX) tablet 40 mg  40 mg Oral QODAY Hudson, Caralyn, PA-C         Discharge Medications: Please see discharge summary for a list of discharge medications.  Relevant Imaging Results:  Relevant Lab Results:   Additional Information SS#: [id number]  Truddie Hidden, RN

## 2023-08-03 NOTE — ED Notes (Signed)
BLS TRANSPORT TO FACILITY BUT UNABLE TO TAKE PT DUE TO CARDIAC MONITORING IN MED NEC. [facility] CALLED TO UPDATE AND PT ON LIST FOR ALS TRANSPORT.

## 2023-08-03 NOTE — ED Notes (Signed)
 [facility] called for transport to [facility]

## 2023-08-03 NOTE — ED Notes (Signed)
Low BP readings. MD notified. Will continue to monitor.

## 2023-08-03 NOTE — Evaluation (Signed)
Occupational Therapy Evaluation Patient Details Name: OLIVIA PAVELKO MRN: [id number] DOB: 1932-12-08 Today's Date: 08/03/2023   History of Present Illness   Pt is an 89 y.o. female referred from the cardiologist office to the ED because of concerns of CHF exacerbation, Dilated cardiomyopathy, Acute on chronic respiratory failure with hypoxia and Borderline hypotension. PMH includes NSTEMI 11/2021, T2DM, paroxysmal a-fib on Eliquis, CVA 11/2021, HTN, hypothyroidism, anemia, COPD on home O2 at 4 L, HFrEF with improved EF secondary to NICM, last EF 50% in September 2024 (previously 20 to 25% 2023).     Clinical Impressions Pt was seen for OT evaluation this date. Prior to hospital admission, pt was a resident at [facility] where she was a 1 person assist for SPT and had assist from staff or her daughter for all ADLs.   Pt presents to acute OT demonstrating impaired ADL performance and functional mobility 2/2 weakness and low activity tolerance (See OT problem list for additional functional deficits). Pt currently requires Max A for bed mobility with increased time and cueing for hand placement. Mod A for STS from EOB to RW and Min A for SPT to recliner using RW. Pt unable to scoot back in recliner, required Max A to scoot back. Set up for grooming tasks in recliner and able to wash face, needed Min A for oral care to open toothpaste, place it on toothbrush. She is very close to her baseline function, but will benefit from skilled OT services to prevent further decline and ensure return to PLOF. She may benefit from therapy follow up upon return to her facility.      If plan is discharge home, recommend the following:   A little help with walking and/or transfers;A lot of help with bathing/dressing/bathroom     Functional Status Assessment   Patient has had a recent decline in their functional status and demonstrates the ability to make significant improvements in function in a reasonable  and predictable amount of time.     Equipment Recommendations   None recommended by OT     Recommendations for Other Services         Precautions/Restrictions   Precautions Precautions: Fall Restrictions Weight Bearing Restrictions Per Provider Order: No     Mobility Bed Mobility Overal bed mobility: Needs Assistance Bed Mobility: Supine to Sit     Supine to sit: Max assist     General bed mobility comments: increased time, verb cues for hand placement on bed rails; Max A    Transfers Overall transfer level: Needs assistance Equipment used: Rolling walker (2 wheels) Transfers: Sit to/from Stand, Bed to chair/wheelchair/BSC Sit to Stand: Mod assist     Step pivot transfers: Min assist     General transfer comment: Mod A for STS from EOB at lowest height; Min A for SPT to recliner with increased time and heavy UE reliance on RW      Balance Overall balance assessment: Needs assistance Sitting-balance support: Bilateral upper extremity supported Sitting balance-Leahy Scale: Good Sitting balance - Comments: No LOB noted.   Standing balance support: Bilateral upper extremity supported Standing balance-Leahy Scale: Fair Standing balance comment: no LOB during transfer                           ADL either performed or assessed with clinical judgement   ADL Overall ADL's : Needs assistance/impaired;At baseline     Grooming: Wash/dry face;Oral care;Minimal assistance;Sitting Grooming Details (indicate cue  type and reason): seated in recliner                 Toilet Transfer: Rolling walker (2 wheels) Toilet Transfer Details (indicate cue type and reason): simulated to recliner with Min A                 Vision         Perception         Praxis         Pertinent Vitals/Pain Pain Assessment Pain Assessment: No/denies pain     Extremity/Trunk Assessment Upper Extremity Assessment Upper Extremity Assessment:  Generalized weakness   Lower Extremity Assessment Lower Extremity Assessment: Generalized weakness       Communication Communication Communication: No apparent difficulties (slightly hard to understanding d/t low volume) Factors Affecting Communication: Hearing impaired   Cognition Arousal: Alert Behavior During Therapy: WFL for tasks assessed/performed                                 Following commands: Intact       Cueing  General Comments   Cueing Techniques: Verbal cues      Exercises Other Exercises Other Exercises: Edu on role of OT in acute setting and importance of therapy.   Shoulder Instructions      Home Living Family/patient expects to be discharged to:: Skilled nursing facility                                 Additional Comments: Pt lives at [facility] and want to return there.      Prior Functioning/Environment Prior Level of Function : Needs assist             Mobility Comments: As per daughter, [facility]  Pt needs Min assit for supine ot sit and sit to supine and for STS. Transfer to Toilet with CGA using FWW. ADLs Comments: Staff and duaghter assists with ADLs, meals, and meds.    OT Problem List: Decreased strength;Impaired balance (sitting and/or standing);Decreased activity tolerance   OT Treatment/Interventions: Self-care/ADL training;Therapeutic exercise;Therapeutic activities;Patient/family education;Balance training      OT Goals(Current goals can be found in the care plan section)   Acute Rehab OT Goals Patient Stated Goal: return to [facility] OT Goal Formulation: With patient/family Time For Goal Achievement: 08/17/23 Potential to Achieve Goals: Good ADL Goals Pt Will Perform Grooming: with supervision;sitting Pt Will Transfer to Toilet: with contact guard assist;stand pivot transfer;bedside commode   OT Frequency:  Min 1X/week    Co-evaluation              AM-PAC OT "6 Clicks" Daily  Activity     Outcome Measure Help from another person eating meals?: A Little Help from another person taking care of personal grooming?: A Little Help from another person toileting, which includes using toliet, bedpan, or urinal?: A Lot Help from another person bathing (including washing, rinsing, drying)?: A Lot Help from another person to put on and taking off regular upper body clothing?: A Little Help from another person to put on and taking off regular lower body clothing?: A Lot 6 Click Score: 15   End of Session Equipment Utilized During Treatment: Rolling walker (2 wheels) Nurse Communication: Mobility status  Activity Tolerance: Patient tolerated treatment well Patient left: in chair;with call bell/phone within reach;with chair alarm set;with family/visitor  present  OT Visit Diagnosis: Other abnormalities of gait and mobility (R26.89);Muscle weakness (generalized) (M62.81)                Time: 4098-1191 OT Time Calculation (min): 29 min Charges:  OT General Charges $OT Visit: 1 Visit OT Evaluation $OT Eval Moderate Complexity: 1 Mod OT Treatments $Self Care/Home Management : 8-22 mins Devaney Segers, OTR/L  08/03/23, 11:17 AM  Zephaniah Lubrano E Jarrette Dehner 08/03/2023, 11:14 AM

## 2023-08-03 NOTE — Discharge Instructions (Addendum)
Please check central pulse ox if patient shows signs of hypoxia via pulse ox on the extremities Please check patient's blood pressure tomorrow morning and hold any nondiuretic blood pressure medicines if hypotensive

## 2023-08-03 NOTE — ED Notes (Signed)
[facility] unable to transport pt due to cardiac monitor need. [facility] called to send ALS truck

## 2023-08-03 NOTE — Plan of Care (Signed)
   Problem: Education: Goal: Ability to describe self-care measures that may prevent or decrease complications (Diabetes Survival Skills Education) will improve Outcome: Progressing   Problem: Fluid Volume: Goal: Ability to maintain a balanced intake and output will improve Outcome: Progressing   Problem: Nutritional: Goal: Maintenance of adequate nutrition will improve Outcome: Progressing   Problem: Skin Integrity: Goal: Risk for impaired skin integrity will decrease Outcome: Progressing

## 2023-08-03 NOTE — Consult Note (Signed)
[facility] Liaison Consult Note   08/03/2023  Jacqueline [name] 1932-07-10 [id number]  [facility] [[facility]] Consult: on ACO high risk list on banner   Primary Care Provider:  Earnestine Mealing, MD with [facility] (at facility).  * Coverage for Elliot Cousin, [facility] Liaison remote coverage review for patient admitted to [facility] Medicare ACO REACH  Patient was reviewed for high risk score and for barriers to care in community.  Patient is noted to be a "resident" at [facility].   Patient was screened for hospitalization and on behalf of [facility] to assess for post hospital community care needs.    Will update [facility] RN that patient is a "resident' at [facility] with supportive daughter noted. No VBCI needs noted.  For questions or referrals, please contact:  Charlesetta Shanks, RN, BSN, CCM [facility]  [facility] Liaison Direct Dial: [phone] or secure chat Email: [email or web address]

## 2023-08-03 NOTE — Care Management Important Message (Signed)
Important Message  Patient Details  Name: Jacqueline [name] MRN: [id number] Date of Birth: Apr 21, 1933   Important Message Given:  Yes - Medicare IM     Cristela Blue, CMA 08/03/2023, 10:17 AM

## 2023-08-03 NOTE — Discharge Summary (Signed)
Physician Discharge Summary   [name] MRN: [id number] DOB: 1932-09-07  Admit date:     07/30/2023  Discharge date: 08/03/23  Discharge Physician: Lurene Shadow   PCP: Earnestine Mealing, MD   Recommendations at discharge:   Follow-up with physician at the nursing home within 3 days of discharge  Discharge Diagnoses: Active Problems:   Acute on chronic heart failure with preserved ejection fraction (HFpEF) (HCC)   Dilated idiopathic cardiomyopathy (HCC)   Acute on chronic respiratory failure with hypoxia (HCC)   Hypertension   Peripheral cyanosis   Hypotension   Chronic atrial fibrillation (HCC)   Acute renal failure superimposed on stage 3a chronic kidney disease (HCC)   COPD (chronic obstructive pulmonary disease) (HCC)   Acquired hypothyroidism   Type 2 diabetes mellitus without complications (HCC)   Recurrent major depressive disorder, in partial remission (HCC)   History of CVA (cerebrovascular accident)  Resolved Problems:   * No resolved hospital problems. [facility] Course:   Jacqueline [name] is a 89 y.o. female  with medical history significant for NSTEMI 11/2021, T2DM, paroxysmal atrial fibrillation on Eliquis, CVA 11/2021, HTN, hypothyroidism, anemia, COPD on home O2 at 4 L, HFrEF with improved EF secondary to NICM, last EF 50% in September 2024 (previously 20 to 25% 2023).  She was referred from the cardiologist office to the emergency department because of concerns of CHF exacerbation.  Her daughter reported that patient was found to have bluish discoloration of her fingers up to her knuckles and increasing swelling of her lower extremities.  She had also become increasingly short of breath.   Assessment and Plan:   Acute on chronic HFpEF: Improved. He will be discharged on torsemide 20 mg every Monday, Wednesday, Friday and Sunday. Torsemide 40 mg every Tuesday, Thursday and Saturday.  2D echo on 07/30/2022 showed EF estimated at 40 to 45%, mild LVH,  grade 1 diastolic dysfunction, mildly elevated pulmonary artery systolic pressure, mild MR 2D echo in September 2024 showed EF estimated at 50%.     Hypotension: BP is better.  Discontinue valsartan at discharge.     Acute on chronic hypoxemic respiratory failure: Improved.  She is tolerating 3.5 L/min oxygen via nasal cannula.  She uses 4 L/min oxygen at baseline.  She was previously requiring oxygen via heated humidified high flow nasal cannula, 40% at 40 L/min.     AKI on CKD stage IIIa: Creatinine is stable. 1.86-1.66-1.68-1.55.   Baseline creatinine around 1.3     Paroxysmal atrial fibrillation/atrial flutter, history of stroke: Continue Eliquis     Type 2 diabetes mellitus: Hold Jardiance for now because of mild metabolic acidosis and elevated creatinine above baseline.  Follow-up with PCP for further recommendations.      General weakness: PT recommended discharge back to SNF.     Comorbidities include hypothyroidism, recurrent major depression, hypertension, cognitive decline, ? dementia     Discharge plan was discussed with [name], daughter, at the bedside.         Consultants: Cardiologist Procedures performed: None Disposition: Skilled nursing facility Diet recommendation:  Cardiac and Carb modified diet DISCHARGE MEDICATION: Allergies as of 08/03/2023       Reactions   Ciprofloxacin    Digoxin And Related    Entresto [sacubitril-valsartan] Other (See Comments)   Hypotensive   Erythromycin    Lopid [gemfibrozil]    Nitrofurantoin    Spironolactone Other (See Comments)   Hospitalized for dehydration   Cefuroxime Rash   Penicillins Rash  Has patient had a PCN reaction causing immediate rash, facial/tongue/throat swelling, SOB or lightheadedness with hypotension: Unknown Has patient had a PCN reaction causing severe rash involving mucus membranes or skin necrosis: No Has patient had a PCN reaction that required hospitalization: No Has patient had a  PCN reaction occurring within the last 10 years: No If all of the above answers are "NO", then may proceed with Cephalosporin use.        Medication List     STOP taking these medications    Jardiance 10 MG Tabs tablet Generic drug: empagliflozin   valsartan 80 MG tablet Commonly known as: DIOVAN       TAKE these medications    acetaminophen 325 MG tablet Commonly known as: TYLENOL Take 2 tablets (650 mg total) by mouth every 4 (four) hours as needed for headache or mild pain.   alendronate 70 MG tablet Commonly known as: FOSAMAX Take 70 mg by mouth once a week. Take with a full glass of water on an empty stomach.   apixaban 2.5 MG Tabs tablet Commonly known as: ELIQUIS Take 1 tablet (2.5 mg total) by mouth 2 (two) times daily. Start on 11/30/2021   arformoterol 15 MCG/2ML Nebu Commonly known as: BROVANA Take 2 mLs (15 mcg total) by nebulization in the morning and at bedtime.   ARTIFICIAL TEAR SOLUTION OP Apply 1 drop to eye 2 (two) times daily. Both Eyes.   atorvastatin 80 MG tablet Commonly known as: LIPITOR Take 1 tablet (80 mg total) by mouth daily.   budesonide 0.25 MG/2ML nebulizer solution Commonly known as: Pulmicort Take 2 mLs (0.25 mg total) by nebulization in the morning and at bedtime.   D3-1000 25 MCG (1000 UT) capsule Generic drug: Cholecalciferol Take 1,000 Units by mouth daily.   Dextromethorphan-guaiFENesin 20-400 MG Tabs Take 1 tablet by mouth 2 (two) times daily.   diclofenac Sodium 1 % Gel Commonly known as: VOLTAREN Apply 2 g topically daily as needed (pain). Apply to back topically every day shift for pain.   ENSURE ENLIVE PO Take 1 Can by mouth as directed. After meals if she eats less than 50 % due to poor appetite and weight loss s/p covid   (feeding supplement) PROSource Plus liquid Take 30 mLs by mouth daily.   ferrous sulfate 325 (65 FE) MG EC tablet Take 325 mg by mouth. Every Monday, Wednesday and Friday.    hydrocortisone 25 MG suppository Commonly known as: ANUSOL-HC Place 25 mg rectally 2 (two) times daily as needed for hemorrhoids or anal itching.   levothyroxine 50 MCG tablet Commonly known as: SYNTHROID Take 50 mcg by mouth daily before breakfast.   OXYGEN 4lpm via [place] for SOB continuous.   pantoprazole 40 MG tablet Commonly known as: PROTONIX Take 40 mg by mouth in the morning and at bedtime.   polyethylene glycol 17 g packet Commonly known as: MIRALAX / GLYCOLAX Take 17 g by mouth every other day.   potassium chloride 20 MEQ packet Commonly known as: KLOR-CON Take 20 mEq by mouth daily.   senna-docusate 8.6-50 MG tablet Commonly known as: Senokot-S Take 2 tablets by mouth daily.   sertraline 50 MG tablet Commonly known as: ZOLOFT Take 50 mg by mouth daily.   torsemide 20 MG tablet Commonly known as: DEMADEX Take torsemide 20 mg every Monday, Wednesday, Friday and Sunday. Take torsemide 40 mg every Tuesday, Thursday, Saturday What changed:  how much to take how to take this when to take this additional instructions  Discharge Care Instructions  (From admission, onward)           Start     Ordered   08/03/23 0000  Discharge wound care:       Comments: Wound care  Daily      Comments: Cleanse buttock wound with saline, pat dry Apply hydrogel [place] # [phone]) directly to wound bed, top with dry dressing.  Change daily  Wound care  Every other day    Comments: Cleanse leg wound with saline, cover with single layer of xeroform gauze, top with foam. Change every other day   08/03/23 1026            Follow-up Information     Paraschos, Alexander, MD. Go in 2 week(s).   Specialty: Cardiology Contact information: [address] [facility] [place] [address] [phone]         [facility]. Go on 08/07/2023.   Specialty: Cardiology Why: Hospital  Follow-Up 08/07/23 @ 1:00pm Please bring all meds to follow-up appt [facility] [address] Floor, [address] [employer] parking at the [employer] information: [address] [place] [address] [phone]               Discharge Exam: Ceasar Mons Weights   07/30/23 1542 08/01/23 0500 08/03/23 0500  Weight: 51.7 kg 53.4 kg 54.5 kg   GEN: NAD SKIN: Warm and dry EYES: No pallor or icterus ENT: MMM CV: RRR PULM: CTA B ABD: soft, ND, NT, +BS CNS: AAO x 2 (person and place), non focal EXT: No edema or tenderness   Condition at discharge: good  The results of significant diagnostics from this hospitalization (including imaging, microbiology, ancillary and laboratory) are listed below for reference.   Imaging Studies: ECHOCARDIOGRAM COMPLETE Result Date: 07/31/2023    ECHOCARDIOGRAM REPORT   Patient Name:   ANDEE CHIVERS Date of Exam: 07/31/2023 Medical Rec #:  [id number]     Height:       57.0 in Accession #:    [id number]    Weight:       114.0 lb Date of Birth:  December 31, 1932     BSA:          1.416 m Patient Age:    89 years      BP:           103/65 mmHg Patient Gender: F             HR:           86 bpm. Exam Location:  [facility] Procedure: 2D Echo, Cardiac Doppler and Color Doppler (Both Spectral and Color            Flow Doppler were utilized during procedure). Indications:     Acute Respiratory Distress  History:         Patient has prior history of Echocardiogram examinations, most                  recent 11/27/2021. CHF and Cardiomyopathy, Previous Myocardial                  Infarction, COPD and Stroke, Arrythmias:Atrial Fibrillation;                  Risk Factors:Hypertension, Diabetes and Dyslipidemia. CKD.  Sonographer:     Mikki Harbor Referring Phys:  [id number] Andris Baumann Diagnosing Phys: Alwyn Pea MD  Sonographer Comments: Image acquisition challenging  due to respiratory motion. IMPRESSIONS  1. Left ventricular ejection fraction,  by estimation, is 40 to 45%. The left ventricle has mildly decreased function. The left ventricle demonstrates global hypokinesis. There is mild asymmetric left ventricular hypertrophy of the inferior and basal segments. Left ventricular diastolic parameters are consistent with Grade I diastolic dysfunction (impaired relaxation).  2. Right ventricular systolic function is moderately reduced. The right ventricular size is moderately enlarged. There is mildly elevated pulmonary artery systolic pressure.  3. The mitral valve is grossly normal. Mild mitral valve regurgitation.  4. The aortic valve is normal in structure. Aortic valve regurgitation is mild. FINDINGS  Left Ventricle: Left ventricular ejection fraction, by estimation, is 40 to 45%. The left ventricle has mildly decreased function. The left ventricle demonstrates global hypokinesis. Strain imaging was not performed. The left ventricular internal cavity  size was normal in size. There is mild asymmetric left ventricular hypertrophy of the inferior and basal segments. Left ventricular diastolic parameters are consistent with Grade I diastolic dysfunction (impaired relaxation). Right Ventricle: The right ventricular size is moderately enlarged. No increase in right ventricular wall thickness. Right ventricular systolic function is moderately reduced. There is mildly elevated pulmonary artery systolic pressure. The tricuspid regurgitant velocity is 2.71 m/s, and with an assumed right atrial pressure of 15 mmHg, the estimated right ventricular systolic pressure is 44.4 mmHg. Left Atrium: Left atrial size was normal in size. Right Atrium: Right atrial size was normal in size. Pericardium: There is no evidence of pericardial effusion. Mitral Valve: The mitral valve is grossly normal. Mild to moderate mitral annular calcification. Mild mitral valve regurgitation. MV peak gradient, 4.9 mmHg. The mean mitral valve gradient is 2.0 mmHg. Tricuspid Valve: The tricuspid  valve is normal in structure. Tricuspid valve regurgitation is mild. Aortic Valve: The aortic valve is normal in structure. Aortic valve regurgitation is mild. Aortic valve mean gradient measures 3.0 mmHg. Aortic valve peak gradient measures 5.8 mmHg. Aortic valve area, by VTI measures 1.85 cm. Pulmonic Valve: The pulmonic valve was normal in structure. Pulmonic valve regurgitation is mild to moderate. Aorta: The ascending aorta was not well visualized. IAS/Shunts: No atrial level shunt detected by color flow Doppler. Additional Comments: 3D imaging was not performed.  LEFT VENTRICLE PLAX 2D LVIDd:         4.30 cm   Diastology LVIDs:         3.30 cm   LV e' lateral:   9.36 cm/s LV PW:         1.30 cm   LV E/e' lateral: 11.3 LV IVS:        1.00 cm LVOT diam:     2.00 cm LV SV:         39 LV SV Index:   28 LVOT Area:     3.14 cm  RIGHT VENTRICLE RV Basal diam:  3.95 cm RV Mid diam:    5.20 cm LEFT ATRIUM             Index        RIGHT ATRIUM           Index LA diam:        4.10 cm 2.89 cm/m   RA Area:     20.10 cm LA Vol (A2C):   62.8 ml 44.34 ml/m  RA Volume:   61.90 ml  43.70 ml/m LA Vol (A4C):   49.0 ml 34.59 ml/m LA Biplane Vol: 56.1 ml 39.61 ml/m  AORTIC VALVE  PULMONIC VALVE AV Area (Vmax):    1.95 cm     PV Vmax:       0.76 m/s AV Area (Vmean):   1.80 cm     PV Peak grad:  2.3 mmHg AV Area (VTI):     1.85 cm AV Vmax:           120.67 cm/s AV Vmean:          78.367 cm/s AV VTI:            0.212 m AV Peak Grad:      5.8 mmHg AV Mean Grad:      3.0 mmHg LVOT Vmax:         74.83 cm/s LVOT Vmean:        44.833 cm/s LVOT VTI:          0.125 m LVOT/AV VTI ratio: 0.59  AORTA Ao Root diam: 3.20 cm Ao Asc diam:  3.30 cm MITRAL VALVE                TRICUSPID VALVE MV Area (PHT): 3.72 cm     TR Peak grad:   29.4 mmHg MV Area VTI:   1.68 cm     TR Vmax:        271.00 cm/s MV Peak grad:  4.9 mmHg MV Mean grad:  2.0 mmHg     SHUNTS MV Vmax:       1.11 m/s     Systemic VTI:  0.12 m MV Vmean:       65.7 cm/s    Systemic Diam: 2.00 cm MV Decel Time: 204 msec MV E velocity: 106.00 cm/s Alwyn Pea MD Electronically signed by Alwyn Pea MD Signature Date/Time: 07/31/2023/10:02:24 PM    Final    DG Chest 2 View Result Date: 07/30/2023 CLINICAL DATA:  Shortness of breath EXAM: CHEST - 2 VIEW COMPARISON:  X-ray 02/22/2023 and older. FINDINGS: Normal cardiopericardial silhouette with calcified aorta. There is fullness of the hila bilaterally which could be enlarged pulmonary arteries. No pneumothorax, effusion or edema. Film is under penetrated. There is compression deformity of midthoracic spine vertebral level with some augmentation cement, unchanged. Sclerotic lesion along the left proximal humerus is again noted and could be a chondroid lesion. IMPRESSION: Enlarged central pulmonary arteries as on prior. No consolidation or effusion. Stable compression deformity of a midthoracic spine vertebral level with augmentation cement Electronically Signed   By: Karen Kays M.D.   On: 07/30/2023 17:17    Microbiology: Results for orders placed or performed during the hospital encounter of 07/30/23  Resp panel by RT-PCR (RSV, Flu A&B, Covid) Anterior Nasal Swab     Status: None   Collection Time: 07/30/23  6:07 PM   Specimen: Anterior Nasal Swab  Result Value Ref Range Status   SARS Coronavirus 2 by RT PCR NEGATIVE NEGATIVE Final    Comment: (NOTE) SARS-CoV-2 target nucleic acids are NOT DETECTED.  The SARS-CoV-2 RNA is generally detectable in upper respiratory specimens during the acute phase of infection. The lowest concentration of SARS-CoV-2 viral copies this assay can detect is 138 copies/mL. A negative result does not preclude SARS-Cov-2 infection and should not be used as the sole basis for treatment or other patient management decisions. A negative result may occur with  improper specimen collection/handling, submission of specimen other than nasopharyngeal swab, presence of viral  mutation(s) within the areas targeted by this assay, and inadequate number of viral copies(<138 copies/mL). A negative result must  be combined with clinical observations, patient history, and epidemiological information. The expected result is Negative.  Fact Sheet for Patients:  BloggerCourse.com  Fact Sheet for Healthcare Providers:  SeriousBroker.it  This test is no t yet approved or cleared by the [place] FDA and  has been authorized for detection and/or diagnosis of SARS-CoV-2 by FDA under an Emergency Use Authorization (EUA). This EUA will remain  in effect (meaning this test can be used) for the duration of the COVID-19 declaration under Section 564(b)(1) of the Act, 21 U.S.C.section 360bbb-3(b)(1), unless the authorization is terminated  or revoked sooner.       Influenza A by PCR NEGATIVE NEGATIVE Final   Influenza B by PCR NEGATIVE NEGATIVE Final    Comment: (NOTE) The Xpert Xpress SARS-CoV-2/FLU/RSV plus assay is intended as an aid in the diagnosis of influenza from Nasopharyngeal swab specimens and should not be used as a sole basis for treatment. Nasal washings and aspirates are unacceptable for Xpert Xpress SARS-CoV-2/FLU/RSV testing.  Fact Sheet for Patients: BloggerCourse.com  Fact Sheet for Healthcare Providers: SeriousBroker.it  This test is not yet approved or cleared by the [place] FDA and has been authorized for detection and/or diagnosis of SARS-CoV-2 by FDA under an Emergency Use Authorization (EUA). This EUA will remain in effect (meaning this test can be used) for the duration of the COVID-19 declaration under Section 564(b)(1) of the Act, 21 U.S.C. section 360bbb-3(b)(1), unless the authorization is terminated or revoked.     Resp Syncytial Virus by PCR NEGATIVE NEGATIVE Final    Comment: (NOTE) Fact Sheet for  Patients: BloggerCourse.com  Fact Sheet for Healthcare Providers: SeriousBroker.it  This test is not yet approved or cleared by the [place] FDA and has been authorized for detection and/or diagnosis of SARS-CoV-2 by FDA under an Emergency Use Authorization (EUA). This EUA will remain in effect (meaning this test can be used) for the duration of the COVID-19 declaration under Section 564(b)(1) of the Act, 21 U.S.C. section 360bbb-3(b)(1), unless the authorization is terminated or revoked.  Performed at [facility], [address] [place], [place] [address]     Labs: CBC: Recent Labs  Lab 07/30/23 1546 07/31/23 0520  WBC 11.8* 6.2  HGB 14.9 12.1  HCT 49.7* 40.2  MCV 85.0 81.5  PLT 178 160   Basic Metabolic Panel: Recent Labs  Lab 07/30/23 1546 07/31/23 0520 08/01/23 0515 08/02/23 0508 08/03/23 0602  NA 138 138 140 139 136  K 5.1 4.3 4.1 3.9 4.1  CL 110 109 109 109 107  CO2 17* 20* 23 19* 21*  GLUCOSE 109* 105* 96 102* 108*  BUN 52* 52* 51* 52* 48*  CREATININE 1.86* 1.79* 1.66* 1.68* 1.55*  CALCIUM 8.8* 8.4* 8.6* 8.7* 8.6*  MG  --   --  2.3  --   --    Liver Function Tests: No results for input(s): "AST", "ALT", "ALKPHOS", "BILITOT", "PROT", "ALBUMIN" in the last 168 hours. CBG: Recent Labs  Lab 08/02/23 1146 08/02/23 1337 08/02/23 1614 08/02/23 2103 08/03/23 0818  GLUCAP 126* 105* 120* 143* 116*    Discharge time spent: greater than 30 minutes.  Signed: Lurene Shadow, MD [facility] 08/03/2023

## 2023-08-03 NOTE — ED Triage Notes (Signed)
Patient had been discharged from [facility] ED 45 minutes ago. [facility] after arriving at [facility]. Pt was SOB and Spo2 was in 70s. Patient on 10L Non-rebreather. Baseline pt wears 5L [place]. Patient is altered. Patient speaks [place] and [place].  BP129/63 RA stat at 75%. Applied 5LNC to patient. Put patient on non-rebreather to maintain oxygen saturations.

## 2023-08-03 NOTE — ED Notes (Signed)
Patient now maintaining 98% on 4L [place]

## 2023-08-03 NOTE — TOC Transition Note (Signed)
Transition of Care [facility]) - Discharge Note   Patient Details  Name: Jacqueline [name] MRN: [id number] Date of Birth: [birth year]  [facility] CM/SW Contact:  Truddie Hidden, RN Phone Number: 08/03/2023, 11:35 AM   Clinical Narrative:    Sherron Monday with [name]  in admissions at [facility] Per facility patient admission confirmed for today. Patient assigned room # 416 Nurse will call report to [phone] Face sheet and medical necessity forms printed to the floor to be added to the EMS pack EMS arranged "She's next."  Discharge summary and SNF transfer report sent in HUB.  Nurse, and patient's daughter [name] provided with details of discharge. TOC signing off.          Patient Goals and CMS Choice            Discharge Placement                       Discharge Plan and Services Additional resources added to the After Visit Summary for                                       Social Drivers of Health (SDOH) Interventions SDOH Screenings   Food Insecurity: No Food Insecurity (08/01/2023)  Housing: Low Risk  (08/01/2023)  Transportation Needs: No Transportation Needs (08/01/2023)  Utilities: Not At Risk (08/01/2023)  Depression (PHQ2-9): Low Risk  (12/18/2021)  Social Connections: Moderately Isolated (08/01/2023)  Tobacco Use: Medium Risk (07/30/2023)     Readmission Risk Interventions     No data to display

## 2023-08-03 NOTE — Progress Notes (Signed)
[facility] CLINIC CARDIOLOGY PROGRESS NOTE   Patient ID: Jacqueline [name] MRN: [id number] DOB/AGE: [birth year] 89 y.o.  Admit date: 07/30/2023 Referring Physician Dr. Lindajo Royal Primary Physician Earnestine Mealing, MD  Primary Cardiologist Dr. Darrold Junker Reason for Consultation acute on chronic heart failure  HPI: Jacqueline [name] is a 89 y.o. female with a past medical history of NICM / HFrEF (LVEF 40-45%, G1 DD), chronic respiratory failure on 4 L at baseline, accessible atrial fibrillation, hypertension, hyperlipidemia, CKD 3, type 2 diabetes, hypothyroidism  who presented to the ED on 07/30/2023 for shortness of breath and hypoxia.  She was sent over from heart failure clinic by Dr. Gala Romney.  Cardiology was consulted for further evaluation.  Interval History:  -Patient seen and examined this morning, resting comfortably in bed with daughter present at bedside. -She has diuresed well and has been transitioned to p.o. diuretics.  Renal function improved. -She is back to baseline O2 requirement, reports a little shortness of breath. -Lower extremity edema significantly improved.  BP remains soft.  Review of systems complete and found to be negative unless listed above    Vitals:   08/03/23 0411 08/03/23 0500 08/03/23 0740 08/03/23 0819  BP: 92/65   103/65  Pulse: 73   80  Resp: 20   18  Temp: 97.7 F (36.5 C)   (!) 97.5 F (36.4 C)  TempSrc:    Oral  SpO2: 96%  97% 99%  Weight:  54.5 kg    Height:         Intake/Output Summary (Last 24 hours) at 08/03/2023 1023 Last data filed at 08/02/2023 1700 Gross per 24 hour  Intake 480 ml  Output 400 ml  Net 80 ml     PHYSICAL EXAM General: Chronically ill-appearing elderly female, well nourished, in no acute distress. HEENT: Normocephalic and atraumatic. Neck: No JVD.  Lungs: Normal respiratory effort on 3L [place]. Clear bilaterally to auscultation. No wheezes, crackles, rhonchi.  Heart: Irregularly irregular, controlled rate. Normal S1  and S2 without gallops or murmurs. Radial & DP pulses 2+ bilaterally. Abdomen: Non-distended appearing.  Msk: Normal strength and tone for age. Extremities: No clubbing, cyanosis or edema.   Neuro: Alert and oriented X 3. Psych: Mood appropriate, affect congruent.    LABS: Basic Metabolic Panel: Recent Labs    08/01/23 0515 08/02/23 0508 08/03/23 0602  NA 140 139 136  K 4.1 3.9 4.1  CL 109 109 107  CO2 23 19* 21*  GLUCOSE 96 102* 108*  BUN 51* 52* 48*  CREATININE 1.66* 1.68* 1.55*  CALCIUM 8.6* 8.7* 8.6*  MG 2.3  --   --    Liver Function Tests: No results for input(s): "AST", "ALT", "ALKPHOS", "BILITOT", "PROT", "ALBUMIN" in the last 72 hours. No results for input(s): "LIPASE", "AMYLASE" in the last 72 hours. CBC: No results for input(s): "WBC", "NEUTROABS", "HGB", "HCT", "MCV", "PLT" in the last 72 hours. Cardiac Enzymes: No results for input(s): "CKTOTAL", "CKMB", "CKMBINDEX", "TROPONINIHS" in the last 72 hours. BNP: No results for input(s): "BNP" in the last 72 hours. D-Dimer: No results for input(s): "DDIMER" in the last 72 hours. Hemoglobin A1C: No results for input(s): "HGBA1C" in the last 72 hours. Fasting Lipid Panel: No results for input(s): "CHOL", "HDL", "LDLCALC", "TRIG", "CHOLHDL", "LDLDIRECT" in the last 72 hours. Thyroid Function Tests: No results for input(s): "TSH", "T4TOTAL", "T3FREE", "THYROIDAB" in the last 72 hours.  Invalid input(s): "FREET3" Anemia Panel: No results for input(s): "VITAMINB12", "FOLATE", "FERRITIN", "TIBC", "IRON", "RETICCTPCT" in the last  72 hours.  No results found.   ECHO 07/31/2023: 1. Left ventricular ejection fraction, by estimation, is 40 to 45%. The left ventricle has mildly decreased function. The left ventricle demonstrates global hypokinesis. There is mild asymmetric left ventricular hypertrophy of the inferior and basal segments. Left ventricular diastolic parameters are consistent with Grade I diastolic dysfunction  (impaired relaxation).   2. Right ventricular systolic function is moderately reduced. The right  ventricular size is moderately enlarged. There is mildly elevated  pulmonary artery systolic pressure.   3. The mitral valve is grossly normal. Mild mitral valve regurgitation.   4. The aortic valve is normal in structure. Aortic valve regurgitation is mild.   TELEMETRY reviewed by me 08/03/23: Atrial fibrillation rate 80s  EKG reviewed by me 08/03/23: A flutter rate 83 bpm  DATA reviewed by me 08/03/23: last 24h vitals tele labs imaging I/O, hospitalist progress note  Active Problems:   Acute on chronic heart failure with preserved ejection fraction (HFpEF) (HCC)   Hypertension   Acquired hypothyroidism   Acute renal failure superimposed on stage 3a chronic kidney disease (HCC)   Chronic atrial fibrillation (HCC)   Type 2 diabetes mellitus without complications (HCC)   Dilated idiopathic cardiomyopathy (HCC)   Recurrent major depressive disorder, in partial remission (HCC)   Acute on chronic respiratory failure with hypoxia (HCC)   Peripheral cyanosis   Hypotension   COPD (chronic obstructive pulmonary disease) (HCC)   History of CVA (cerebrovascular accident)    ASSESSMENT AND PLAN: Jacqueline [name] is a 89 y.o. female with a past medical history of NICM / HFrEF (LVEF 40-45%, G1 DD), chronic respiratory failure on 4 L at baseline, paroxysmal atrial fibrillation, hypertension, hyperlipidemia, CKD 3, type 2 diabetes, hypothyroidism who presented to the ED on 07/30/2023 for shortness of breath and hypoxia.  She was sent over from heart failure clinic by Dr. Gala Romney.  Cardiology was consulted for further evaluation.  # Acute on chronic HFrEF # Non-ischemic cardiomyopathy Patient initially sent to the ED for worsening shortness of breath and hypoxia.  Found to be in acute heart failure exacerbation with elevated BNP at 1400.  She was started on IV diuresis.  Has diuresed well and is now  without any lower extremity edema and back to baseline O2 requirement.  Echo this admission showed EF 40-45%.  Renal function improved with diuresis, creatinine 1.55 today. -Transition to p.o. torsemide alternating 40 mg and 20 mg every other day. -Further additions to GDMT limited by BP and renal function.  # Persistent atrial fibrillation Patient maintaining in atrial fibrillation/flutter with controlled heart rate. -Continue Eliquis 2.5 mg twice daily for stroke risk reduction (dose appropriate given age greater than 80 years and weight less than 60 kg). -Not currently on any rate control medications due to hypotension.  Ok for discharge today from a cardiac perspective. Will arrange for follow up in clinic with Dr. Darrold Junker in 2 weeks.  She will be set up with heart failure clinic appointment with Clarisa Kindred, NP within 5 to 7 days of discharge.  This patient's case was discussed and created with Dr. Corky Sing and he is in agreement.  Signed:  Gale Journey, PA-C  08/03/2023, 10:23 AM [facility] Cardiology

## 2023-08-03 NOTE — ED Notes (Signed)
Called Report to [place], Charity fundraiser at [employer]. Call Back number [phone].

## 2023-08-04 ENCOUNTER — Non-Acute Institutional Stay (SKILLED_NURSING_FACILITY): Payer: Self-pay | Admitting: Nurse Practitioner

## 2023-08-04 ENCOUNTER — Encounter: Payer: Self-pay | Admitting: Nurse Practitioner

## 2023-08-04 DIAGNOSIS — E119 Type 2 diabetes mellitus without complications: Secondary | ICD-10-CM

## 2023-08-04 DIAGNOSIS — E1122 Type 2 diabetes mellitus with diabetic chronic kidney disease: Secondary | ICD-10-CM

## 2023-08-04 DIAGNOSIS — N183 Type 2 diabetes mellitus with diabetic chronic kidney disease: Secondary | ICD-10-CM

## 2023-08-04 DIAGNOSIS — I482 Chronic atrial fibrillation, unspecified: Secondary | ICD-10-CM

## 2023-08-04 DIAGNOSIS — I5023 Acute on chronic systolic (congestive) heart failure: Secondary | ICD-10-CM | POA: Diagnosis not present

## 2023-08-04 DIAGNOSIS — Z66 Do not resuscitate: Secondary | ICD-10-CM

## 2023-08-04 DIAGNOSIS — N939 Abnormal uterine and vaginal bleeding, unspecified: Secondary | ICD-10-CM

## 2023-08-04 NOTE — Progress Notes (Unsigned)
Location:  Other [facility].  Nursing Home Room Number: [facility] [address] Place of Service:  SNF [phone]) Abbey Chatters, NP  PCP: Earnestine Mealing, MD  Patient Care Team: Earnestine Mealing, MD as PCP - General (Family Medicine) Benita Gutter, RN as Registered Nurse  Extended Emergency Contact Information Primary Emergency Contact: [name] Address: [address]          [place], [place] [address] [name] of [place] Home Phone: [phone] Mobile Phone: [phone] Relation: Daughter Secondary Emergency Contact: [name] of [place] Home Phone: [phone] Relation: Son  Goals of care: Advanced Directive information    08/03/2023    3:04 PM  Advanced Directives  Does Patient Have a Medical Advance Directive? Yes  Does patient want to make changes to medical advance directive? Yes (ED - Information included in AVS)     Chief Complaint  Patient presents with   Hospitalization Follow-up    Hospital Follow up    HPI:  Pt is a 89 y.o. female seen today for Hospital Follow up. Pt with medical history significant for NSTEMI 11/2021, T2DM, paroxysmal atrial fibrillation on Eliquis, CVA 11/2021, HTN, hypothyroidism, anemia, COPD on home O2 at 4 L, HFrEF with improved EF secondary to NICM, last EF 50% in September 2024 (previously 20 to 25% 2023). She had been admitted to hospital from 07/30/2023-08/03/2023 due to heart failure.  Daughter also reports her cognition has declined since hospitalization.  She reports she is having ongoing shortness of breath since hospitalization but appears comfortable in chair watching TV.  Nursing has no acute concerns at [facility] time.   She is a lot weaker with 2 person assist. More shortness of breath noted with transfers.    Past Medical History:  Diagnosis Date   Acute exacerbation of CHF (congestive heart failure) (HCC) 11/18/2021   AKI (acute kidney injury) (HCC) 11/26/2021   Anemia    CHF (congestive heart  failure) (HCC)    Diabetes mellitus without complication (HCC) 03/13/2017   Currently no high BS.  Has come off meds.  But having episodes of low BS now.   Dilated idiopathic cardiomyopathy (HCC)    Dizziness 02/18/2017   GERD (gastroesophageal reflux disease)    Hyperkalemia 11/26/2021   Hyperlipidemia    Hypertension    Hyponatremia 06/09/2017   Hypothyroidism    Osteoporosis    Renal disorder    stage 3   Past Surgical History:  Procedure Laterality Date   BUNIONECTOMY     COLONOSCOPY     ESOPHAGOGASTRODUODENOSCOPY (EGD) WITH PROPOFOL N/A 10/15/2017   Procedure: ESOPHAGOGASTRODUODENOSCOPY (EGD) WITH PROPOFOL;  Surgeon: Christena Deem, MD;  Location: [facility] ENDOSCOPY;  Service: Endoscopy;  Laterality: N/A;   KYPHOPLASTY N/A 06/12/2017   Procedure: UXNATFTDDUK-G2;  Surgeon: Kennedy Bucker, MD;  Location: [facility] ORS;  Service: Orthopedics;  Laterality: N/A;   TONSILLECTOMY      Allergies  Allergen Reactions   Ciprofloxacin    Digoxin And Related    Entresto [Sacubitril-Valsartan] Other (See Comments)    Hypotensive    Erythromycin    Lopid [Gemfibrozil]    Nitrofurantoin    Spironolactone Other (See Comments)    Hospitalized for dehydration   Cefuroxime Rash   Penicillins Rash    Has patient had a PCN reaction causing immediate rash, facial/tongue/throat swelling, SOB or lightheadedness with hypotension: Unknown Has patient had a PCN reaction causing severe rash involving mucus membranes or skin necrosis: No Has patient had a PCN reaction that required hospitalization: No  Has patient had a PCN reaction occurring within the last 10 years: No If all of the above answers are "NO", then may proceed with Cephalosporin use.     Outpatient Encounter Medications as of 08/04/2023  Medication Sig   acetaminophen (TYLENOL) 325 MG tablet Take 2 tablets (650 mg total) by mouth every 4 (four) hours as needed for headache or mild pain.   alendronate (FOSAMAX) 70 MG tablet Take 70 mg  by mouth once a week. Take with a full glass of water on an empty stomach.   apixaban (ELIQUIS) 2.5 MG TABS tablet Take 1 tablet (2.5 mg total) by mouth 2 (two) times daily. Start on 11/30/2021   arformoterol (BROVANA) 15 MCG/2ML NEBU Take 2 mLs (15 mcg total) by nebulization in the morning and at bedtime.   ARTIFICIAL TEAR SOLUTION OP Apply 1 drop to eye 2 (two) times daily. Both Eyes.   atorvastatin (LIPITOR) 80 MG tablet Take 1 tablet (80 mg total) by mouth daily.   budesonide (PULMICORT) 0.25 MG/2ML nebulizer solution Take 2 mLs (0.25 mg total) by nebulization in the morning and at bedtime.   Cholecalciferol (D3-1000) 25 MCG (1000 UT) capsule Take 1,000 Units by mouth daily.   Dextromethorphan-guaiFENesin 20-400 MG TABS Take 1 tablet by mouth 2 (two) times daily.   diclofenac Sodium (VOLTAREN) 1 % GEL Apply 2 g topically daily as needed (pain). Apply to back topically every day shift for pain.   ferrous sulfate 325 (65 FE) MG EC tablet Take 325 mg by mouth. Every Monday, Wednesday and Friday.   hydrocortisone (ANUSOL-HC) 25 MG suppository Place 25 mg rectally 2 (two) times daily as needed for hemorrhoids or anal itching.   levothyroxine (SYNTHROID) 50 MCG tablet Take 50 mcg by mouth daily before breakfast.   Nutritional Supplements (,FEEDING SUPPLEMENT, PROSOURCE PLUS) liquid Take 30 mLs by mouth daily.   Nutritional Supplements (ENSURE ENLIVE PO) Take 1 Can by mouth as directed. After meals if she eats less than 50 % due to poor appetite and weight loss s/p covid   OXYGEN 4lpm via [place] for SOB continuous.   pantoprazole (PROTONIX) 40 MG tablet Take 40 mg by mouth in the morning and at bedtime.   polyethylene glycol (MIRALAX / GLYCOLAX) 17 g packet Take 17 g by mouth every other day.   potassium chloride (KLOR-CON) 20 MEQ packet Take 20 mEq by mouth daily.   senna-docusate (SENOKOT-S) 8.6-50 MG tablet Take 2 tablets by mouth daily.   sertraline (ZOLOFT) 50 MG tablet Take 50 mg by mouth daily.    torsemide (DEMADEX) 20 MG tablet Take torsemide 20 mg every Monday, Wednesday, Friday and Sunday. Take torsemide 40 mg every Tuesday, Thursday, Saturday   No facility-administered encounter medications on file as of 08/04/2023.    Review of Systems  Unable to perform ROS: Other   ***  Immunization History  Administered Date(s) Administered   Influenza Inj Mdck Quad Pf 03/10/2019, 03/18/2021   Influenza Split 04/18/2014, 05/01/2015   Influenza, High Dose Seasonal PF 04/01/2022   Influenza,inj,Quad PF,6+ Mos 03/12/2020   Influenza-Unspecified 03/24/2012, 03/24/2013, 04/18/2014, 05/01/2015, 03/18/2021, 04/01/2022, 04/08/2023   PFIZER Comirnaty(Gray Top)Covid-19 Tri-Sucrose Vaccine 07/21/2019, 08/12/2019   PNEUMOCOCCAL CONJUGATE-20 04/29/2022   Pfizer Covid-19 Vaccine Bivalent Booster 95yrs & up 11/22/2021   Pneumococcal Polysaccharide-23 08/20/2015, 04/29/2022   Unspecified SARS-COV-2 Vaccination 07/21/2019, 08/11/2019   Zoster Recombinant(Shingrix) 04/29/2022   Pertinent  Health Maintenance Due  Topic Date Due   OPHTHALMOLOGY EXAM  08/25/2023   HEMOGLOBIN A1C  11/16/2023  FOOT EXAM  12/09/2023   INFLUENZA VACCINE  Completed   DEXA SCAN  Completed      11/28/2021    7:57 PM 11/29/2021    7:59 AM 12/18/2021   12:34 PM 08/14/2022   11:35 AM 10/23/2022    2:27 PM  Fall Risk  Falls in the past year?   0 0 0  Was there an injury with Fall?   0 0   Fall Risk Category Calculator   0 0   Fall Risk Category (Retired)   Low    (RETIRED) Patient Fall Risk Level High fall risk High fall risk Moderate fall risk    Patient at Risk for Falls Due to   Impaired mobility;Impaired balance/gait No Fall Risks   Fall risk Follow up   Falls evaluation completed;Education provided;Falls prevention discussed Falls evaluation completed    Functional Status Survey:    Vitals:   08/04/23 1115  BP: 132/66  Pulse: 94  Resp: 18  Temp: (!) 97.2 F (36.2 C)  SpO2: 95%  Weight: 111 lb (50.3 kg)   Height: 4\' 9"  (1.448 m)   Body mass index is 24.02 kg/m. Physical Exam Constitutional:      General: She is not in acute distress.    Appearance: She is well-developed. She is not diaphoretic.  HENT:     Head: Normocephalic and atraumatic.     Mouth/Throat:     Pharynx: No oropharyngeal exudate.  Eyes:     Conjunctiva/sclera: Conjunctivae normal.     Pupils: Pupils are equal, round, and reactive to light.  Cardiovascular:     Rate and Rhythm: Normal rate and regular rhythm.     Heart sounds: Normal heart sounds.  Pulmonary:     Effort: Pulmonary effort is normal.     Breath sounds: Normal breath sounds.  Abdominal:     General: Bowel sounds are normal.     Palpations: Abdomen is soft.  Musculoskeletal:     Cervical back: Normal range of motion and neck supple.     Right lower leg: No edema.     Left lower leg: No edema.  Skin:    General: Skin is warm and dry.  Neurological:     Mental Status: She is alert.  Psychiatric:        Mood and Affect: Mood normal.   ***  Labs reviewed: Recent Labs    08/01/23 0515 08/02/23 0508 08/03/23 0602 08/03/23 1506  NA 140 139 136 136  K 4.1 3.9 4.1 4.8  CL 109 109 107 104  CO2 23 19* 21* 25  GLUCOSE 96 102* 108* 113*  BUN 51* 52* 48* 50*  CREATININE 1.66* 1.68* 1.55* 1.66*  CALCIUM 8.6* 8.7* 8.6* 8.8*  MG 2.3  --   --   --    Recent Labs    08/18/22 0000 02/22/23 0745 08/03/23 1506  AST 28 20 20   ALT 21 15 13   ALKPHOS 89 91 60  BILITOT  --  1.1 0.7  PROT  --  7.9 7.3  ALBUMIN 3.7 3.8 3.5   Recent Labs    06/04/23 0000 06/08/23 0000 07/20/23 0000 07/30/23 1546 07/31/23 0520 08/03/23 1506  WBC 5.8   < > 6.4 11.8* 6.2 6.4  NEUTROABS 3,822.00  --  3,597.00  --   --  4.2  HGB 12.2   < > 12.4 14.9 12.1 14.0  HCT 40   < > 41 49.7* 40.2 47.3*  MCV  --   --   --  85.0 81.5 82.0  PLT 172   < > 192 178 160 177   < > = values in this interval not displayed.   Lab Results  Component Value Date   TSH 5.80  06/18/2023   Lab Results  Component Value Date   HGBA1C 6.4 05/18/2023   Lab Results  Component Value Date   CHOL 146 07/17/2022   HDL 38 07/17/2022   LDLCALC 77 07/17/2022   TRIG 226 (A) 07/17/2022   CHOLHDL 7.7 11/27/2021    Significant Diagnostic Results in last 30 days:  DG Chest Port 1 View Result Date: 08/03/2023 CLINICAL DATA:  Shortness of breath.  History of pulmonary edema. EXAM: PORTABLE CHEST 1 VIEW COMPARISON:  Chest radiograph dated 07/30/2023. FINDINGS: No focal consolidation, pleural effusion, or pneumothorax. The cardiac silhouette is within limits. Atherosclerotic calcification of the aorta. Osteopenia with degenerative changes of the spine. No acute osseous pathology. Similar sclerotic lesion of the proximal left humerus. IMPRESSION: No active disease. Electronically Signed   By: Elgie Collard M.D.   On: 08/03/2023 17:55   ECHOCARDIOGRAM COMPLETE Result Date: 07/31/2023    ECHOCARDIOGRAM REPORT   Patient Name:   Jacqueline [name] Date of Exam: 07/31/2023 Medical Rec #:  [id number]     Height:       57.0 in Accession #:    [id number]    Weight:       114.0 lb Date of Birth:  [birth year]     BSA:          1.416 m Patient Age:    89 years      BP:           103/65 mmHg Patient Gender: F             HR:           86 bpm. Exam Location:  [facility] Procedure: 2D Echo, Cardiac Doppler and Color Doppler (Both Spectral and Color            Flow Doppler were utilized during procedure). Indications:     Acute Respiratory Distress  History:         Patient has prior history of Echocardiogram examinations, most                  recent 11/27/2021. CHF and Cardiomyopathy, Previous Myocardial                  Infarction, COPD and Stroke, Arrythmias:Atrial Fibrillation;                  Risk Factors:Hypertension, Diabetes and Dyslipidemia. CKD.  Sonographer:     Mikki Harbor Referring Phys:  [id number] Andris Baumann Diagnosing Phys: Alwyn Pea MD  Sonographer Comments: Image acquisition  challenging due to respiratory motion. IMPRESSIONS  1. Left ventricular ejection fraction, by estimation, is 40 to 45%. The left ventricle has mildly decreased function. The left ventricle demonstrates global hypokinesis. There is mild asymmetric left ventricular hypertrophy of the inferior and basal segments. Left ventricular diastolic parameters are consistent with Grade I diastolic dysfunction (impaired relaxation).  2. Right ventricular systolic function is moderately reduced. The right ventricular size is moderately enlarged. There is mildly elevated pulmonary artery systolic pressure.  3. The mitral valve is grossly normal. Mild mitral valve regurgitation.  4. The aortic valve is normal in structure. Aortic valve regurgitation is mild. FINDINGS  Left Ventricle: Left ventricular ejection fraction, by estimation, is 40 to 45%. The left ventricle has mildly  decreased function. The left ventricle demonstrates global hypokinesis. Strain imaging was not performed. The left ventricular internal cavity  size was normal in size. There is mild asymmetric left ventricular hypertrophy of the inferior and basal segments. Left ventricular diastolic parameters are consistent with Grade I diastolic dysfunction (impaired relaxation). Right Ventricle: The right ventricular size is moderately enlarged. No increase in right ventricular wall thickness. Right ventricular systolic function is moderately reduced. There is mildly elevated pulmonary artery systolic pressure. The tricuspid regurgitant velocity is 2.71 m/s, and with an assumed right atrial pressure of 15 mmHg, the estimated right ventricular systolic pressure is 44.4 mmHg. Left Atrium: Left atrial size was normal in size. Right Atrium: Right atrial size was normal in size. Pericardium: There is no evidence of pericardial effusion. Mitral Valve: The mitral valve is grossly normal. Mild to moderate mitral annular calcification. Mild mitral valve regurgitation. MV peak  gradient, 4.9 mmHg. The mean mitral valve gradient is 2.0 mmHg. Tricuspid Valve: The tricuspid valve is normal in structure. Tricuspid valve regurgitation is mild. Aortic Valve: The aortic valve is normal in structure. Aortic valve regurgitation is mild. Aortic valve mean gradient measures 3.0 mmHg. Aortic valve peak gradient measures 5.8 mmHg. Aortic valve area, by VTI measures 1.85 cm. Pulmonic Valve: The pulmonic valve was normal in structure. Pulmonic valve regurgitation is mild to moderate. Aorta: The ascending aorta was not well visualized. IAS/Shunts: No atrial level shunt detected by color flow Doppler. Additional Comments: 3D imaging was not performed.  LEFT VENTRICLE PLAX 2D LVIDd:         4.30 cm   Diastology LVIDs:         3.30 cm   LV e' lateral:   9.36 cm/s LV PW:         1.30 cm   LV E/e' lateral: 11.3 LV IVS:        1.00 cm LVOT diam:     2.00 cm LV SV:         39 LV SV Index:   28 LVOT Area:     3.14 cm  RIGHT VENTRICLE RV Basal diam:  3.95 cm RV Mid diam:    5.20 cm LEFT ATRIUM             Index        RIGHT ATRIUM           Index LA diam:        4.10 cm 2.89 cm/m   RA Area:     20.10 cm LA Vol (A2C):   62.8 ml 44.34 ml/m  RA Volume:   61.90 ml  43.70 ml/m LA Vol (A4C):   49.0 ml 34.59 ml/m LA Biplane Vol: 56.1 ml 39.61 ml/m  AORTIC VALVE                    PULMONIC VALVE AV Area (Vmax):    1.95 cm     PV Vmax:       0.76 m/s AV Area (Vmean):   1.80 cm     PV Peak grad:  2.3 mmHg AV Area (VTI):     1.85 cm AV Vmax:           120.67 cm/s AV Vmean:          78.367 cm/s AV VTI:            0.212 m AV Peak Grad:      5.8 mmHg AV Mean Grad:      3.0  mmHg LVOT Vmax:         74.83 cm/s LVOT Vmean:        44.833 cm/s LVOT VTI:          0.125 m LVOT/AV VTI ratio: 0.59  AORTA Ao Root diam: 3.20 cm Ao Asc diam:  3.30 cm MITRAL VALVE                TRICUSPID VALVE MV Area (PHT): 3.72 cm     TR Peak grad:   29.4 mmHg MV Area VTI:   1.68 cm     TR Vmax:        271.00 cm/s MV Peak grad:  4.9 mmHg MV  Mean grad:  2.0 mmHg     SHUNTS MV Vmax:       1.11 m/s     Systemic VTI:  0.12 m MV Vmean:      65.7 cm/s    Systemic Diam: 2.00 cm MV Decel Time: 204 msec MV E velocity: 106.00 cm/s Alwyn Pea MD Electronically signed by Alwyn Pea MD Signature Date/Time: 07/31/2023/10:02:24 PM    Final    DG Chest 2 View Result Date: 07/30/2023 CLINICAL DATA:  Shortness of breath EXAM: CHEST - 2 VIEW COMPARISON:  X-ray 02/22/2023 and older. FINDINGS: Normal cardiopericardial silhouette with calcified aorta. There is fullness of the hila bilaterally which could be enlarged pulmonary arteries. No pneumothorax, effusion or edema. Film is under penetrated. There is compression deformity of midthoracic spine vertebral level with some augmentation cement, unchanged. Sclerotic lesion along the left proximal humerus is again noted and could be a chondroid lesion. IMPRESSION: Enlarged central pulmonary arteries as on prior. No consolidation or effusion. Stable compression deformity of a midthoracic spine vertebral level with augmentation cement Electronically Signed   By: Karen Kays M.D.   On: 07/30/2023 17:17    Assessment/Plan No problem-specific Assessment & Plan notes found for this encounter.     Janene Harvey. Biagio Borg [facility] [phone]

## 2023-08-05 LAB — BLOOD GAS, VENOUS
Bicarbonate: 27.8 mmol/L (ref 20.0–28.0)
O2 Saturation: 26.3 mmol/L (ref 0.0–2.0)
Patient temperature: 37
Patient temperature: 37 %
pCO2, Ven: 54 mm[Hg] (ref 44–60)
pH, Ven: 7.32 (ref 7.25–7.43)
pO2, Ven: <31 mmol/L — CL (ref 32–45)

## 2023-08-06 ENCOUNTER — Telehealth: Payer: Self-pay | Admitting: Family

## 2023-08-06 ENCOUNTER — Ambulatory Visit: Payer: Medicare Other | Admitting: Pulmonary Disease

## 2023-08-06 NOTE — Progress Notes (Unsigned)
Advanced Heart Failure Clinic Note    PCP: Earnestine Mealing, MD (last seen 10/24) Primary Cardiologist: Marcina Millard, MD (last seen 12/24)  Chief Complaint: shortness of breath    HPI:   Ms Kilker is a 89 y/o female with a history of NSTEMI 11/2021, T2DM, paroxysmal atrial fibrillation, CVA 11/2021, hyperlipidemia, HTN, CKD, thyroid disease, anemia, GERD, hyperkalemia, hypothyroidism 03/2017, chronic respiratory failure on baseline 4L, osteoporosis and chronic heart failure.  Admitted 11/18/21 due to acute on chronic hypoxic respiratory failure initially requiring biPAP due to actue on chronic HF. Initially given IV lasix with transition to oral diuretics. Cardiology consult obtained.  PT/OT evaluations done. Discharged after 7 days to SNF. Admitted 11/26/21 due to lethargy, slurred speech diaphoresis with left shoulder pain radiating to the arm and back pain.  MRI brain was obtained and showed acute right cerebellar infarct. Troponin elevated. Neurology, cardiology and palliative care consults obtained. Diagnosed with NSTEMI but heparin deferred due to stroke. Found to have new onset AF. 6/15: repeated head CT given recurrent episode on minimal responsiveness, worsened speech and diaphoresis, no hemorrhagic conversion. Discharged after 3 days.   Admitted 02/22/23 due to cough and SOB, O2 sats in the 70s. She was previously diagnosed with COVID on 8/22 and treated with Paxlovid for 5 days. She was diagnosed with post-viral bacterial infection and treated with Ceftriaxone and Azithromycin. Chest x-ray negative for pneumonia. 1 dose of IV lasix given. Trop elevation 49>53, pattern is flat, likely demanding ischemia from hypoxia. Discharged to rehab at [facility].    Admitted 07/30/23 from HF clinic due to bluish discoloration of her fingers up to her knuckles and increasing swelling of her lower extremities along with increased shortness of breath. ABG on 5 L showed pO2 of 66 and she was  subsequently transition to heated high flow with improvement in PaO2 to 88 and improvement in cyanosis of fingers. Troponin 37 and BNP 1404. IV diuresed. O2 weaned to baseline. Diuretic changed to oral. Symptoms improved.   Was in the ED 08/03/23 due to oxygen levels in the 70's with baseline oxygen on without respiratory distress after being discharged earlier in the day. BNP elevated. Sats in the 90's in the ED. No change in medication therapy.   Echo 01/05/19: EF 55-60% with mild LAE Echo 11/18/21: EF 20-25% with severe LVH, Grade II DD, mild LAE, mild MR Echo 11/27/21: EF 40-45% along with mild LVH and mild MR.  Echo 03/06/23: EF 50% (@ [facility]) Echo 07/31/23: EF 40-45% with mild LVH, RV moderately reduced, mildly elevated PA pressure, mild MR/AR  Ms. Stoffel lives at [facility]. She presents today, with her daughter for a hospital follow-up visit with a chief complaint of moderate shortness of breath with little exertion. Has associated fatigue and pedal edema along with this. Denies chest pain, palpitations, cough, abdominal distention, dizziness or difficulty sleeping. Daughter says that patient is "much better" from when she was here last time which ended up with her being in the hospital. Wearing oxygen at 4L around the clock.   Has upcoming pulmonology appointment on 08/10/23.   ROS: All systems negative except as listed in HPI, PMH and Problem List.  SH:  Social History   Socioeconomic History   Marital status: Widowed    Spouse name: Not on file   Number of children: Not on file   Years of education: Not on file   Highest education level: Not on file  Occupational History   Not on file  Tobacco  Use   Smoking status: Former    Current packs/day: 0.00    Types: Cigarettes    Quit date: 04/18/1984    Years since quitting: 39.3   Smokeless tobacco: Never  Vaping Use   Vaping status: Former   Start date: 05/16/1965   Quit date: 05/16/1989  Substance and Sexual Activity   Alcohol  use: No   Drug use: No   Sexual activity: Never  Other Topics Concern   Not on file  Social History Narrative   Not on file   Social Drivers of Health   Financial Resource Strain: Not on file  Food Insecurity: No Food Insecurity (08/01/2023)   Hunger Vital Sign    Worried About Running Out of Food in the Last Year: Never true    Ran Out of Food in the Last Year: Never true  Transportation Needs: No Transportation Needs (08/01/2023)   PRAPARE - Administrator, Civil Service (Medical): No    Lack of Transportation (Non-Medical): No  Physical Activity: Not on file  Stress: Not on file  Social Connections: Moderately Isolated (08/01/2023)   Social Connection and Isolation Panel [NHANES]    Frequency of Communication with Friends and Family: More than three times a week    Frequency of Social Gatherings with Friends and Family: Once a week    Attends Religious Services: 1 to 4 times per year    Active Member of [employer] or Organizations: No    Attends Banker Meetings: Never    Marital Status: Widowed  Intimate Partner Violence: Not At Risk (08/01/2023)   Humiliation, Afraid, Rape, and Kick questionnaire    Fear of Current or Ex-Partner: No    Emotionally Abused: No    Physically Abused: No    Sexually Abused: No    FH:  Family History  Problem Relation Age of Onset   Premature CHD Brother     Past Medical History:  Diagnosis Date   Acute exacerbation of CHF (congestive heart failure) (HCC) 11/18/2021   AKI (acute kidney injury) (HCC) 11/26/2021   Anemia    CHF (congestive heart failure) (HCC)    Diabetes mellitus without complication (HCC) 03/13/2017   Currently no high BS.  Has come off meds.  But having episodes of low BS now.   Dilated idiopathic cardiomyopathy (HCC)    Dizziness 02/18/2017   GERD (gastroesophageal reflux disease)    Hyperkalemia 11/26/2021   Hyperlipidemia    Hypertension    Hyponatremia 06/09/2017   Hypothyroidism     Osteoporosis    Renal disorder    stage 3    Current Outpatient Medications  Medication Sig Dispense Refill   acetaminophen (TYLENOL) 325 MG tablet Take 2 tablets (650 mg total) by mouth every 4 (four) hours as needed for headache or mild pain.     alendronate (FOSAMAX) 70 MG tablet Take 70 mg by mouth once a week. Take with a full glass of water on an empty stomach.     apixaban (ELIQUIS) 2.5 MG TABS tablet Take 1 tablet (2.5 mg total) by mouth 2 (two) times daily. Start on 11/30/2021 60 tablet    arformoterol (BROVANA) 15 MCG/2ML NEBU Take 2 mLs (15 mcg total) by nebulization in the morning and at bedtime. 120 mL 6   ARTIFICIAL TEAR SOLUTION OP Apply 1 drop to eye 2 (two) times daily. Both Eyes.     atorvastatin (LIPITOR) 80 MG tablet Take 1 tablet (80 mg total) by mouth daily.  budesonide (PULMICORT) 0.25 MG/2ML nebulizer solution Take 2 mLs (0.25 mg total) by nebulization in the morning and at bedtime. 60 mL 12   Cholecalciferol (D3-1000) 25 MCG (1000 UT) capsule Take 1,000 Units by mouth daily.     Dextromethorphan-guaiFENesin 20-400 MG TABS Take 1 tablet by mouth 2 (two) times daily.     diclofenac Sodium (VOLTAREN) 1 % GEL Apply 2 g topically daily as needed (pain). Apply to back topically every day shift for pain.     ferrous sulfate 325 (65 FE) MG EC tablet Take 325 mg by mouth. Every Monday, Wednesday and Friday.     hydrocortisone (ANUSOL-HC) 25 MG suppository Place 25 mg rectally 2 (two) times daily as needed for hemorrhoids or anal itching.     levothyroxine (SYNTHROID) 50 MCG tablet Take 50 mcg by mouth daily before breakfast.     Nutritional Supplements (,FEEDING SUPPLEMENT, PROSOURCE PLUS) liquid Take 30 mLs by mouth daily.     Nutritional Supplements (ENSURE ENLIVE PO) Take 1 Can by mouth as directed. After meals if she eats less than 50 % due to poor appetite and weight loss s/p covid     OXYGEN 4lpm via [place] for SOB continuous.     pantoprazole (PROTONIX) 40 MG tablet Take  40 mg by mouth in the morning and at bedtime.     polyethylene glycol (MIRALAX / GLYCOLAX) 17 g packet Take 17 g by mouth every other day.     potassium chloride (KLOR-CON) 20 MEQ packet Take 20 mEq by mouth daily.     senna-docusate (SENOKOT-S) 8.6-50 MG tablet Take 2 tablets by mouth daily.     sertraline (ZOLOFT) 50 MG tablet Take 50 mg by mouth daily.     torsemide (DEMADEX) 20 MG tablet Take torsemide 20 mg every Monday, Wednesday, Friday and Sunday. Take torsemide 40 mg every Tuesday, Thursday, Saturday     No current facility-administered medications for this visit.   Vitals:   08/07/23 1326  BP: 106/60  Pulse: 81  SpO2: 94%   Wt Readings from Last 3 Encounters:  08/04/23 111 lb (50.3 kg)  08/03/23 120 lb 2.4 oz (54.5 kg)  08/03/23 120 lb 2.4 oz (54.5 kg)   Lab Results  Component Value Date   CREATININE 1.66 (H) 08/03/2023   CREATININE 1.55 (H) 08/03/2023   CREATININE 1.68 (H) 08/02/2023    EKG: not done   PHYSICAL EXAM:  General: elderly, well-appearing. No resp difficulty, in wheelchair on oxygen HEENT: normal Neck: supple, no JVD Cor: HR irregular, 2/6 SEM RUSB Lungs: clear Abdomen: soft, nontender, nondistended. Extremities: no cyanosis, clubbing, rash, 1+ pitting edema bilateral lower legs Neuro: alert & oriented X 3. Moves all 4 extremities w/o difficulty. Affect pleasant; does defer to daughter to answer most questions   ASSESSMENT & PLAN:  1. NICM with mildly decreased EF - NYHA class III - euvolemic - unable to stand to get weighed; getting weighed at facility - Echo 01/05/19: EF 55-60% with mild LAE - Echo 11/18/21: EF 20-25% with severe LVH, Grade II DD, mild LAE, mild MR - Echo 11/27/21: EF 40-45% along with mild LVH and mild MR.  - Echo 03/06/23: EF 50% (@ [facility]) - Echo 07/31/23: EF 40-45% with mild LVH, RV moderately reduced, mildly elevated PA pressure, mild MR/AR - jardiance and losartan both on hold since recent admission due to  hypotension - continue torsemide 20mg  M, W, F & Sun alternating with 40mg  Tue, Thurs & Sat - BMET today -  has allergy to spironolactone & entresto (hypotension) - wearing compression socks daily - discussed palliative care referral to assist with GOC and management of multiple disease states; daughter is agreeable to this - BNP 08/03/23 reviewed and was 1263.5  2. HTN- - BP 106/60 - currently seeing PCP at [facility] - BMET 08/03/23 reviewed and showed sodium 136, potassium 4.8, creatinine 1.66 & GFR 29 - BMET today  3: DM- - A1c 05/18/23 was 6.4% - continue atorvastatin 80mg  daily - LDL 07/17/22 reviewed and was 77  4: PAF- - saw cardiology ([facility]) 12/24; returns 04/25 - continue apixaban 2.5mg  BID  5: Chronic respiratory therapy- - wearing oxygen at 4L around the clock - to see pulmonology ([place]) next week   Return in 1 month, sooner if needed  Delma Freeze, FNP 08/06/23

## 2023-08-06 NOTE — Telephone Encounter (Signed)
Pt confirmed appt for 08/07/23

## 2023-08-07 ENCOUNTER — Encounter: Payer: Self-pay | Admitting: Family

## 2023-08-07 ENCOUNTER — Ambulatory Visit: Payer: Medicare Other | Attending: Family | Admitting: Family

## 2023-08-07 VITALS — BP 106/60 | HR 81

## 2023-08-07 DIAGNOSIS — I130 Hypertensive heart and chronic kidney disease with heart failure and stage 1 through stage 4 chronic kidney disease, or unspecified chronic kidney disease: Secondary | ICD-10-CM | POA: Insufficient documentation

## 2023-08-07 DIAGNOSIS — N183 Chronic kidney disease, stage 3 unspecified: Secondary | ICD-10-CM | POA: Insufficient documentation

## 2023-08-07 DIAGNOSIS — M810 Age-related osteoporosis without current pathological fracture: Secondary | ICD-10-CM | POA: Diagnosis not present

## 2023-08-07 DIAGNOSIS — D631 Anemia in chronic kidney disease: Secondary | ICD-10-CM | POA: Insufficient documentation

## 2023-08-07 DIAGNOSIS — Z7901 Long term (current) use of anticoagulants: Secondary | ICD-10-CM | POA: Diagnosis not present

## 2023-08-07 DIAGNOSIS — E119 Type 2 diabetes mellitus without complications: Secondary | ICD-10-CM | POA: Diagnosis not present

## 2023-08-07 DIAGNOSIS — K219 Gastro-esophageal reflux disease without esophagitis: Secondary | ICD-10-CM | POA: Diagnosis not present

## 2023-08-07 DIAGNOSIS — I420 Dilated cardiomyopathy: Secondary | ICD-10-CM | POA: Insufficient documentation

## 2023-08-07 DIAGNOSIS — I480 Paroxysmal atrial fibrillation: Secondary | ICD-10-CM | POA: Diagnosis not present

## 2023-08-07 DIAGNOSIS — I252 Old myocardial infarction: Secondary | ICD-10-CM | POA: Insufficient documentation

## 2023-08-07 DIAGNOSIS — Z79899 Other long term (current) drug therapy: Secondary | ICD-10-CM | POA: Insufficient documentation

## 2023-08-07 DIAGNOSIS — E1122 Type 2 diabetes mellitus with diabetic chronic kidney disease: Secondary | ICD-10-CM | POA: Insufficient documentation

## 2023-08-07 DIAGNOSIS — E785 Hyperlipidemia, unspecified: Secondary | ICD-10-CM | POA: Diagnosis not present

## 2023-08-07 DIAGNOSIS — Z9981 Dependence on supplemental oxygen: Secondary | ICD-10-CM | POA: Diagnosis not present

## 2023-08-07 DIAGNOSIS — E875 Hyperkalemia: Secondary | ICD-10-CM | POA: Diagnosis not present

## 2023-08-07 DIAGNOSIS — Z8673 Personal history of transient ischemic attack (TIA), and cerebral infarction without residual deficits: Secondary | ICD-10-CM | POA: Insufficient documentation

## 2023-08-07 DIAGNOSIS — J9610 Chronic respiratory failure, unspecified whether with hypoxia or hypercapnia: Secondary | ICD-10-CM | POA: Diagnosis not present

## 2023-08-07 DIAGNOSIS — Z888 Allergy status to other drugs, medicaments and biological substances status: Secondary | ICD-10-CM | POA: Diagnosis not present

## 2023-08-07 DIAGNOSIS — Z87891 Personal history of nicotine dependence: Secondary | ICD-10-CM | POA: Insufficient documentation

## 2023-08-07 DIAGNOSIS — I10 Essential (primary) hypertension: Secondary | ICD-10-CM | POA: Diagnosis not present

## 2023-08-07 DIAGNOSIS — E039 Hypothyroidism, unspecified: Secondary | ICD-10-CM | POA: Insufficient documentation

## 2023-08-07 DIAGNOSIS — I5022 Chronic systolic (congestive) heart failure: Secondary | ICD-10-CM | POA: Insufficient documentation

## 2023-08-07 NOTE — Patient Instructions (Signed)
 Go DOWN to LOWER LEVEL (LL) to have your blood work completed inside of [employer] office.  We will only call you if the results are abnormal or if the provider would like to make medication changes.

## 2023-08-08 ENCOUNTER — Encounter: Payer: Self-pay | Admitting: Family

## 2023-08-08 LAB — BASIC METABOLIC PANEL
BUN/Creatinine Ratio: 29 — ABNORMAL HIGH (ref 12–28)
BUN: 51 mg/dL — ABNORMAL HIGH (ref 8–27)
CO2: 20 mmol/L (ref 20–29)
Calcium: 8.5 mg/dL — ABNORMAL LOW (ref 8.7–10.3)
Chloride: 105 mmol/L (ref 96–106)
Creatinine, Ser: 1.75 mg/dL — ABNORMAL HIGH (ref 0.57–1.00)
Glucose: 94 mg/dL (ref 70–99)
Potassium: 4.7 mmol/L (ref 3.5–5.2)
Sodium: 139 mmol/L (ref 134–144)
eGFR: 28 mL/min/{1.73_m2} — ABNORMAL LOW (ref >59)

## 2023-08-10 ENCOUNTER — Telehealth: Payer: Self-pay | Admitting: Pulmonary Disease

## 2023-08-10 ENCOUNTER — Ambulatory Visit (INDEPENDENT_AMBULATORY_CARE_PROVIDER_SITE_OTHER): Payer: Medicare Other | Admitting: Pulmonary Disease

## 2023-08-10 ENCOUNTER — Encounter: Payer: Self-pay | Admitting: Pulmonary Disease

## 2023-08-10 VITALS — BP 110/60 | HR 83 | Temp 96.6°F

## 2023-08-10 DIAGNOSIS — J439 Emphysema, unspecified: Secondary | ICD-10-CM

## 2023-08-10 DIAGNOSIS — R0602 Shortness of breath: Secondary | ICD-10-CM | POA: Diagnosis not present

## 2023-08-10 MED ORDER — LEVALBUTEROL HCL 1.25 MG/0.5ML IN NEBU: 1.2500 mg | INHALATION_SOLUTION | RESPIRATORY_TRACT | 2 refills | Status: DC | PRN

## 2023-08-10 MED ORDER — YUPELRI 175 MCG/3ML IN SOLN: 175.0000 ug | Freq: Every day | RESPIRATORY_TRACT | 6 refills | Status: DC

## 2023-08-10 NOTE — Telephone Encounter (Signed)
 Per [employer], because she is at [facility] they have to file everything under Medicare part A.   Lm x1 for [place].

## 2023-08-10 NOTE — Addendum Note (Signed)
 Addended by: Clarisa Kindred A on: 08/10/2023 08:13 AM   Modules accepted: Orders

## 2023-08-10 NOTE — Telephone Encounter (Signed)
 Pts insurance is charging $1700.00 per month for Yupelri. Other options?

## 2023-08-10 NOTE — Progress Notes (Unsigned)
 Synopsis: Referred in by Earnestine Mealing, MD   Subjective:   PATIENT ID: Oval Linsey GENDER: female DOB: 1932-12-09, MRN: [id number]  Chief Complaint  Patient presents with  . Follow-up    SOB. Cough with light brown/clear. No wheezing.    HPI 89 year old female patient with a past medical history of nonischemic cardiomyopathy EF 40 to 45% in 2023 NYHA class III, type 2 diabetes mellitus, paroxysmal A-fib on Eliquis, CVA in 2023 complicated by left-sided hemiparesis and tremors, hypothyroidism presenting today to the pulmonary clinic for evaluation of ongoing shortness of breath despite optimization of volume status.  Her daughter was with her during visit today reports that since she had COVID about 6 to 9 months ago her breathing became worse with increased oxygen requirement from 2 L nasal cannula up to 5 L nasal cannula.  She is mostly sedentary now and walks barely few feet before she needs to stop to catch her breath.  Before that she was walking with a walker.  She is currently a resident at [facility].  Chest x-ray in 2024 with no radiographic evidence of acute cardiopulmonary disease.  H&H stable.  No peripheral eosinophilia  Social history: Remote history of smoking.   ROS All systems were reviewed and are negative except for the above. Objective:   There were no vitals filed for this visit.    on \\RA  BMI Readings from Last 3 Encounters:  08/04/23 24.02 kg/m  08/03/23 26.00 kg/m  08/03/23 26.00 kg/m   Wt Readings from Last 3 Encounters:  08/04/23 111 lb (50.3 kg)  08/03/23 120 lb 2.4 oz (54.5 kg)  08/03/23 120 lb 2.4 oz (54.5 kg)    Physical Exam GEN: NAD, in wheelchair. HEENT: Supple Neck, Reactive Pupils, EOMI  CVS: Normal S1, Normal S2, irregularly irregular rhythm, no murmurs appreciated. Lungs: Clear bilateral air entry.  Abdomen: Soft, non tender, non distended, + BS  Extremities: Left lower extremity edema +1 to +2.  Labs and imaging were  reviewed.  Ancillary Information   CBC    Component Value Date/Time   WBC 6.4 08/03/2023 1506   RBC 5.77 (H) 08/03/2023 1506   HGB 14.0 08/03/2023 1506   HGB 13.4 06/15/2014 0346   HCT 47.3 (H) 08/03/2023 1506   HCT 40.1 06/15/2014 0346   PLT 177 08/03/2023 1506   PLT 206 06/15/2014 0346   MCV 82.0 08/03/2023 1506   MCV 94 06/15/2014 0346   MCH 24.3 (L) 08/03/2023 1506   MCHC 29.6 (L) 08/03/2023 1506   RDW 21.9 (H) 08/03/2023 1506   RDW 13.3 06/15/2014 0346   LYMPHSABS 1.6 08/03/2023 1506   LYMPHSABS 1.4 06/15/2014 0346   MONOABS 0.5 08/03/2023 1506   MONOABS 0.6 06/15/2014 0346   EOSABS 0.0 08/03/2023 1506   EOSABS 0.2 06/15/2014 0346   BASOSABS 0.0 08/03/2023 1506   BASOSABS 0.0 06/15/2014 0346        No data to display           Assessment & Plan:  89 year old female patient with a past medical history of nonischemic cardiomyopathy EF 40 to 45% in 2023 NYHA class III, type 2 diabetes mellitus, paroxysmal A-fib on Eliquis, CVA in 2023 complicated by left-sided hemiparesis and tremors, hypothyroidism presenting today to the pulmonary clinic for evaluation of ongoing shortness of breath despite optimization of volume status.  #Dyspnea  Multifactorial secondary to NICM and possible reactive airway disease post COVID-19.   []  Start budesonide Neb 0.25 bid scheduled  []  Start  Arformoterol Neb BID scheduled  []  C/w Duonebs Q6H PRN as needed.   #Left lower Extremity edema  []  US duplex today   #NICM EF 40-45%  Follows with [facility]  Euvolemic on exam today.  On Valsartan 160mg  , jardiance 10mg , torsemide 20mg .   #PAF  On eliquis 2.5mg  BID.    No follow-ups on file.  I spent 60 minutes caring for this patient today, including preparing to see the patient, obtaining a medical history , reviewing a separately obtained history, performing a medically appropriate examination and/or evaluation, counseling and educating the patient/family/caregiver,  ordering medications, tests, or procedures, documenting clinical information in the electronic health record, and independently interpreting results (not separately reported/billed) and communicating results to the patient/family/caregiver  Janann Colonel, MD [facility] 08/10/2023 2:04 PM

## 2023-08-10 NOTE — Telephone Encounter (Signed)
 Jacqueline [name] is calling for an alternative for her mother's medication. She would like to know if all 3 coverages were tried for the medication? [phone]

## 2023-08-11 NOTE — Telephone Encounter (Signed)
 I notified [name]. She said she will reach out to the pharmacy and get more information as to why they will not cover the medication.   Dr. Rise Patience you like to send in an alternative medication?

## 2023-08-12 ENCOUNTER — Other Ambulatory Visit: Payer: Self-pay

## 2023-08-12 ENCOUNTER — Telehealth: Payer: Self-pay | Admitting: Student

## 2023-08-12 ENCOUNTER — Inpatient Hospital Stay
Admission: EM | Admit: 2023-08-12 | Discharge: 2023-09-15 | DRG: 291 | Disposition: E | Payer: Medicare Other | Attending: Student in an Organized Health Care Education/Training Program | Admitting: Student in an Organized Health Care Education/Training Program

## 2023-08-12 ENCOUNTER — Emergency Department: Payer: Medicare Other

## 2023-08-12 DIAGNOSIS — Z6824 Body mass index (BMI) 24.0-24.9, adult: Secondary | ICD-10-CM

## 2023-08-12 DIAGNOSIS — N1831 Chronic kidney disease, stage 3a: Secondary | ICD-10-CM | POA: Diagnosis present

## 2023-08-12 DIAGNOSIS — I420 Dilated cardiomyopathy: Secondary | ICD-10-CM | POA: Diagnosis present

## 2023-08-12 DIAGNOSIS — J441 Chronic obstructive pulmonary disease with (acute) exacerbation: Secondary | ICD-10-CM | POA: Diagnosis present

## 2023-08-12 DIAGNOSIS — R7989 Other specified abnormal findings of blood chemistry: Secondary | ICD-10-CM | POA: Diagnosis present

## 2023-08-12 DIAGNOSIS — J4541 Moderate persistent asthma with (acute) exacerbation: Secondary | ICD-10-CM

## 2023-08-12 DIAGNOSIS — Z515 Encounter for palliative care: Secondary | ICD-10-CM

## 2023-08-12 DIAGNOSIS — R627 Adult failure to thrive: Secondary | ICD-10-CM | POA: Diagnosis not present

## 2023-08-12 DIAGNOSIS — I69354 Hemiplegia and hemiparesis following cerebral infarction affecting left non-dominant side: Secondary | ICD-10-CM

## 2023-08-12 DIAGNOSIS — I4819 Other persistent atrial fibrillation: Secondary | ICD-10-CM | POA: Diagnosis present

## 2023-08-12 DIAGNOSIS — Z87891 Personal history of nicotine dependence: Secondary | ICD-10-CM

## 2023-08-12 DIAGNOSIS — L89153 Pressure ulcer of sacral region, stage 3: Secondary | ICD-10-CM | POA: Diagnosis present

## 2023-08-12 DIAGNOSIS — N179 Acute kidney failure, unspecified: Secondary | ICD-10-CM | POA: Diagnosis present

## 2023-08-12 DIAGNOSIS — I5023 Acute on chronic systolic (congestive) heart failure: Secondary | ICD-10-CM

## 2023-08-12 DIAGNOSIS — R079 Chest pain, unspecified: Secondary | ICD-10-CM | POA: Diagnosis present

## 2023-08-12 DIAGNOSIS — J439 Emphysema, unspecified: Secondary | ICD-10-CM | POA: Diagnosis present

## 2023-08-12 DIAGNOSIS — I4430 Unspecified atrioventricular block: Secondary | ICD-10-CM | POA: Diagnosis present

## 2023-08-12 DIAGNOSIS — Z7983 Long term (current) use of bisphosphonates: Secondary | ICD-10-CM

## 2023-08-12 DIAGNOSIS — I130 Hypertensive heart and chronic kidney disease with heart failure and stage 1 through stage 4 chronic kidney disease, or unspecified chronic kidney disease: Secondary | ICD-10-CM | POA: Diagnosis present

## 2023-08-12 DIAGNOSIS — I4892 Unspecified atrial flutter: Secondary | ICD-10-CM | POA: Diagnosis present

## 2023-08-12 DIAGNOSIS — R0602 Shortness of breath: Secondary | ICD-10-CM | POA: Diagnosis present

## 2023-08-12 DIAGNOSIS — Z880 Allergy status to penicillin: Secondary | ICD-10-CM

## 2023-08-12 DIAGNOSIS — E1122 Type 2 diabetes mellitus with diabetic chronic kidney disease: Secondary | ICD-10-CM | POA: Diagnosis present

## 2023-08-12 DIAGNOSIS — F419 Anxiety disorder, unspecified: Secondary | ICD-10-CM | POA: Diagnosis present

## 2023-08-12 DIAGNOSIS — R54 Age-related physical debility: Secondary | ICD-10-CM

## 2023-08-12 DIAGNOSIS — I6930 Unspecified sequelae of cerebral infarction: Secondary | ICD-10-CM

## 2023-08-12 DIAGNOSIS — I272 Pulmonary hypertension, unspecified: Secondary | ICD-10-CM | POA: Diagnosis present

## 2023-08-12 DIAGNOSIS — I482 Chronic atrial fibrillation, unspecified: Secondary | ICD-10-CM | POA: Diagnosis present

## 2023-08-12 DIAGNOSIS — Z66 Do not resuscitate: Secondary | ICD-10-CM | POA: Diagnosis present

## 2023-08-12 DIAGNOSIS — E785 Hyperlipidemia, unspecified: Secondary | ICD-10-CM | POA: Diagnosis present

## 2023-08-12 DIAGNOSIS — I2489 Other forms of acute ischemic heart disease: Secondary | ICD-10-CM | POA: Diagnosis present

## 2023-08-12 DIAGNOSIS — J9622 Acute and chronic respiratory failure with hypercapnia: Secondary | ICD-10-CM | POA: Diagnosis present

## 2023-08-12 DIAGNOSIS — I252 Old myocardial infarction: Secondary | ICD-10-CM

## 2023-08-12 DIAGNOSIS — M19041 Primary osteoarthritis, right hand: Secondary | ICD-10-CM | POA: Diagnosis present

## 2023-08-12 DIAGNOSIS — J9621 Acute and chronic respiratory failure with hypoxia: Secondary | ICD-10-CM | POA: Diagnosis present

## 2023-08-12 DIAGNOSIS — F32A Depression, unspecified: Secondary | ICD-10-CM | POA: Diagnosis present

## 2023-08-12 DIAGNOSIS — J45901 Unspecified asthma with (acute) exacerbation: Secondary | ICD-10-CM | POA: Diagnosis present

## 2023-08-12 DIAGNOSIS — E039 Hypothyroidism, unspecified: Secondary | ICD-10-CM | POA: Diagnosis present

## 2023-08-12 DIAGNOSIS — M19042 Primary osteoarthritis, left hand: Secondary | ICD-10-CM | POA: Diagnosis present

## 2023-08-12 DIAGNOSIS — Z888 Allergy status to other drugs, medicaments and biological substances status: Secondary | ICD-10-CM

## 2023-08-12 DIAGNOSIS — Z79899 Other long term (current) drug therapy: Secondary | ICD-10-CM

## 2023-08-12 DIAGNOSIS — Z8616 Personal history of COVID-19: Secondary | ICD-10-CM

## 2023-08-12 DIAGNOSIS — Z7901 Long term (current) use of anticoagulants: Secondary | ICD-10-CM

## 2023-08-12 DIAGNOSIS — Z883 Allergy status to other anti-infective agents status: Secondary | ICD-10-CM

## 2023-08-12 DIAGNOSIS — I5043 Acute on chronic combined systolic (congestive) and diastolic (congestive) heart failure: Secondary | ICD-10-CM | POA: Diagnosis present

## 2023-08-12 DIAGNOSIS — M810 Age-related osteoporosis without current pathological fracture: Secondary | ICD-10-CM | POA: Diagnosis present

## 2023-08-12 DIAGNOSIS — Z9981 Dependence on supplemental oxygen: Secondary | ICD-10-CM

## 2023-08-12 DIAGNOSIS — Z1152 Encounter for screening for COVID-19: Secondary | ICD-10-CM

## 2023-08-12 DIAGNOSIS — Z79890 Hormone replacement therapy (postmenopausal): Secondary | ICD-10-CM

## 2023-08-12 DIAGNOSIS — Z881 Allergy status to other antibiotic agents status: Secondary | ICD-10-CM

## 2023-08-12 LAB — URINALYSIS, ROUTINE W REFLEX MICROSCOPIC
Bacteria, UA: NONE SEEN
Bilirubin Urine: NEGATIVE
Glucose, UA: NEGATIVE mg/dL
Ketones, ur: NEGATIVE mg/dL
Nitrite: NEGATIVE
Protein, ur: NEGATIVE mg/dL
Specific Gravity, Urine: 1.009 (ref 1.005–1.030)
WBC, UA: >50 WBC/hpf (ref 0–5)
pH: 5 (ref 5.0–8.0)

## 2023-08-12 LAB — CBC
HCT: 44.8 % (ref 36.0–46.0)
Hemoglobin: 13.4 g/dL (ref 12.0–15.0)
MCH: 24.7 pg — ABNORMAL LOW (ref 26.0–34.0)
MCHC: 29.9 g/dL — ABNORMAL LOW (ref 30.0–36.0)
MCV: 82.5 fL (ref 80.0–100.0)
Platelets: 178 10*3/uL (ref 150–400)
RBC: 5.43 MIL/uL — ABNORMAL HIGH (ref 3.87–5.11)
RDW: 21.8 % — ABNORMAL HIGH (ref 11.5–15.5)
WBC: 7.6 10*3/uL (ref 4.0–10.5)
nRBC: 0 % (ref 0.0–0.2)

## 2023-08-12 LAB — BASIC METABOLIC PANEL
Anion gap: 8 (ref 5–15)
BUN: 60 mg/dL — ABNORMAL HIGH (ref 8–23)
CO2: 22 mmol/L (ref 22–32)
Calcium: 8.8 mg/dL — ABNORMAL LOW (ref 8.9–10.3)
Chloride: 109 mmol/L (ref 98–111)
Creatinine, Ser: 1.77 mg/dL — ABNORMAL HIGH (ref 0.44–1.00)
GFR, Estimated: 27 mL/min — ABNORMAL LOW (ref >60)
Glucose, Bld: 103 mg/dL — ABNORMAL HIGH (ref 70–99)
Potassium: 4.7 mmol/L (ref 3.5–5.1)
Sodium: 139 mmol/L (ref 135–145)

## 2023-08-12 LAB — TROPONIN I (HIGH SENSITIVITY)
Troponin I (High Sensitivity): 36 ng/L — ABNORMAL HIGH (ref <18)
Troponin I (High Sensitivity): 37 ng/L — ABNORMAL HIGH (ref <18)

## 2023-08-12 LAB — RESP PANEL BY RT-PCR (RSV, FLU A&B, COVID)  RVPGX2
Influenza A by PCR: NEGATIVE
Influenza B by PCR: NEGATIVE
Resp Syncytial Virus by PCR: NEGATIVE
SARS Coronavirus 2 by RT PCR: NEGATIVE

## 2023-08-12 LAB — BRAIN NATRIURETIC PEPTIDE: B Natriuretic Peptide: 1847.1 pg/mL — ABNORMAL HIGH (ref 0.0–100.0)

## 2023-08-12 MED ORDER — HYDROCODONE-ACETAMINOPHEN 5-325 MG PO TABS
1.0000 | ORAL_TABLET | ORAL | Status: DC | PRN
  Administered 2023-08-13: 2 via ORAL
  Administered 2023-08-13 (×2): 1 via ORAL
  Administered 2023-08-14: 2 via ORAL
  Administered 2023-08-15: 1 via ORAL
  Filled 2023-08-12 (×3): qty 1
  Filled 2023-08-12 (×2): qty 2

## 2023-08-12 MED ORDER — PANTOPRAZOLE SODIUM 40 MG PO TBEC
40.0000 mg | DELAYED_RELEASE_TABLET | Freq: Two times a day (BID) | ORAL | Status: DC
  Administered 2023-08-12 – 2023-08-15 (×6): 40 mg via ORAL
  Filled 2023-08-12 (×7): qty 1

## 2023-08-12 MED ORDER — SERTRALINE HCL 50 MG PO TABS
50.0000 mg | ORAL_TABLET | Freq: Every day | ORAL | Status: DC
  Administered 2023-08-13 – 2023-08-17 (×4): 50 mg via ORAL
  Filled 2023-08-12 (×5): qty 1

## 2023-08-12 MED ORDER — METHYLPREDNISOLONE SODIUM SUCC 125 MG IJ SOLR
125.0000 mg | Freq: Once | INTRAMUSCULAR | Status: AC
  Administered 2023-08-12: 125 mg via INTRAVENOUS
  Filled 2023-08-12: qty 2

## 2023-08-12 MED ORDER — ENSURE ENLIVE PO LIQD
237.0000 mL | Freq: Two times a day (BID) | ORAL | Status: DC
  Administered 2023-08-13 – 2023-08-16 (×5): 237 mL via ORAL

## 2023-08-12 MED ORDER — DM-GUAIFENESIN ER 30-600 MG PO TB12
1.0000 | ORAL_TABLET | Freq: Two times a day (BID) | ORAL | Status: DC
  Administered 2023-08-13 – 2023-08-17 (×6): 1 via ORAL
  Filled 2023-08-12 (×8): qty 1

## 2023-08-12 MED ORDER — ACETAMINOPHEN 325 MG PO TABS: 650.0000 mg | ORAL_TABLET | Freq: Four times a day (QID) | ORAL | Status: DC | PRN

## 2023-08-12 MED ORDER — APIXABAN 2.5 MG PO TABS
2.5000 mg | ORAL_TABLET | Freq: Two times a day (BID) | ORAL | Status: DC
Start: 2023-08-12 — End: 2023-08-16
  Administered 2023-08-13 – 2023-08-15 (×6): 2.5 mg via ORAL
  Filled 2023-08-12 (×8): qty 1

## 2023-08-12 MED ORDER — ONDANSETRON HCL 4 MG PO TABS: 4.0000 mg | ORAL_TABLET | Freq: Four times a day (QID) | ORAL | Status: DC | PRN

## 2023-08-12 MED ORDER — LEVALBUTEROL HCL 1.25 MG/0.5ML IN NEBU
1.2500 mg | INHALATION_SOLUTION | RESPIRATORY_TRACT | Status: DC | PRN
  Filled 2023-08-12: qty 0.5

## 2023-08-12 MED ORDER — FERROUS SULFATE 325 (65 FE) MG PO TABS
325.0000 mg | ORAL_TABLET | Freq: Every day | ORAL | Status: DC
  Administered 2023-08-13 – 2023-08-15 (×3): 325 mg via ORAL
  Filled 2023-08-12 (×4): qty 1

## 2023-08-12 MED ORDER — ACETAMINOPHEN 650 MG RE SUPP: 650.0000 mg | Freq: Four times a day (QID) | RECTAL | Status: DC | PRN

## 2023-08-12 MED ORDER — ONDANSETRON HCL 4 MG/2ML IJ SOLN
4.0000 mg | Freq: Four times a day (QID) | INTRAMUSCULAR | Status: DC | PRN
  Filled 2023-08-12: qty 2

## 2023-08-12 MED ORDER — PREDNISONE 20 MG PO TABS
40.0000 mg | ORAL_TABLET | Freq: Every day | ORAL | Status: DC
  Administered 2023-08-14: 40 mg via ORAL
  Filled 2023-08-12: qty 2

## 2023-08-12 MED ORDER — METHYLPREDNISOLONE SODIUM SUCC 40 MG IJ SOLR
40.0000 mg | Freq: Two times a day (BID) | INTRAMUSCULAR | Status: AC
  Administered 2023-08-13 (×2): 40 mg via INTRAVENOUS
  Filled 2023-08-12 (×2): qty 1

## 2023-08-12 MED ORDER — IPRATROPIUM BROMIDE 0.02 % IN SOLN: 0.5000 mg | Freq: Four times a day (QID) | RESPIRATORY_TRACT | 12 refills | Status: DC

## 2023-08-12 MED ORDER — IPRATROPIUM-ALBUTEROL 0.5-2.5 (3) MG/3ML IN SOLN
3.0000 mL | Freq: Four times a day (QID) | RESPIRATORY_TRACT | Status: DC
  Administered 2023-08-13 (×4): 3 mL via RESPIRATORY_TRACT
  Filled 2023-08-12 (×4): qty 3

## 2023-08-12 MED ORDER — LEVOTHYROXINE SODIUM 50 MCG PO TABS
50.0000 ug | ORAL_TABLET | Freq: Every day | ORAL | Status: DC
  Administered 2023-08-13 – 2023-08-16 (×4): 50 ug via ORAL
  Filled 2023-08-12 (×4): qty 1

## 2023-08-12 MED ORDER — IPRATROPIUM-ALBUTEROL 0.5-2.5 (3) MG/3ML IN SOLN
3.0000 mL | Freq: Once | RESPIRATORY_TRACT | Status: AC
  Administered 2023-08-12: 3 mL via RESPIRATORY_TRACT
  Filled 2023-08-12: qty 3

## 2023-08-12 MED ORDER — ATORVASTATIN CALCIUM 80 MG PO TABS
80.0000 mg | ORAL_TABLET | Freq: Every day | ORAL | Status: DC
  Administered 2023-08-13 – 2023-08-15 (×3): 80 mg via ORAL
  Filled 2023-08-12: qty 4
  Filled 2023-08-12 (×3): qty 1

## 2023-08-12 MED ORDER — POLYETHYLENE GLYCOL 3350 17 G PO PACK
17.0000 g | PACK | ORAL | Status: DC
  Administered 2023-08-13 – 2023-08-15 (×2): 17 g via ORAL
  Filled 2023-08-12 (×2): qty 1

## 2023-08-12 MED ORDER — FUROSEMIDE 10 MG/ML IJ SOLN
40.0000 mg | Freq: Two times a day (BID) | INTRAMUSCULAR | Status: DC
  Administered 2023-08-12 – 2023-08-16 (×8): 40 mg via INTRAVENOUS
  Filled 2023-08-12 (×8): qty 4

## 2023-08-12 MED ORDER — DICLOFENAC SODIUM 1 % EX GEL: 2.0000 g | Freq: Every day | CUTANEOUS | Status: DC | PRN

## 2023-08-12 MED ORDER — SENNOSIDES-DOCUSATE SODIUM 8.6-50 MG PO TABS: 2.0000 | ORAL_TABLET | Freq: Every day | ORAL | Status: DC

## 2023-08-12 NOTE — ED Triage Notes (Signed)
 First Nurse Note: Patient to ED via [facility] form [facility] for SOB and CP. HX afib. At facility, given 12.5mg  metoprolol due to HR of 212 and 70% RA. Seen by PCP on Monday and lasix dose upped from 20 to 40.   94% on 4L- baseline 78-80 HR- afib 113/67 115 cbg

## 2023-08-12 NOTE — Telephone Encounter (Addendum)
 Received call from nurse on the hall concerning for elevated HR and low oxygen level -- in the 70s requiring 6LNC. 5L is the maximum we have on the in-room concentrator.   Spoke with patient's daughter regarding situation. She would be open to patient going back to the hospital. Patient is starting to have some anxiety from the symptoms. Discussed concern that she has end stage heart disease, lung disease, and renal disease and interventions are limited. In hospitalization, jardiance was discontinued. Multple RN evaluated in the facility and concern for stability.   Will send patient to the ED for an evaluation.

## 2023-08-12 NOTE — H&P (Signed)
 History and Physical    [name] GNF:[id number] DOB: [birth year] DOA: 08/12/2023 DOS: the patient was seen and examined on 08/12/2023 PCP: Earnestine Mealing, MD  Patient coming from: Home  Chief Complaint:  Chief Complaint  Patient presents with   Chest Pain    HPI: NAKOMA GOTWALT is a 89 y.o. female with medical history significant for NSTEMI 11/2021, T2DM, paroxysmal atrial fibrillation on Eliquis, CVA 11/2021, HTN, hypothyroidism, anemia, COPD on home O2 at 4 L, HFrEF with improved EF secondary to NICM, (EF 40-45% 07/31/23, down from 50% 02/2023 but previously 20-25% 2023), and recently hospitalized from 2/13 to 08/03/2023 with respiratory failure attributed to acute on chronic HFrEF, improved with high-dose Lasix, who is being admitted with worsening respiratory failure, requiring 10 L high flow nasal cannula in the ED, now being attributed to COPD. Patient was evaluated postdischarge by cardiology on 2/21 and reportedly was feeling much better. She was subsequently evaluated by pulmonology outpatient, on 2/24 when she complained of ongoing shortness of breath despite improved volume status.  She was continued on budesonide twice daily, Xopenex and her Rosalyn Gess was replaced with Yupelri.  Following that visit however, shortness of breath progressively worsening leading to the ED visit today. Per pulmonology note, her daughter noticed that she had increasing episodes of A-fib over the prior 4 weeks Additionally, PCP upped her Lasix from 20 mg to 40 mg on 2/24. Patient arrives via EMS from [facility] with chest pain and shortness of breath.  At the facility she was noted to have a heart rate of 212 and O2 sat 70% on room air.  Was given metoprolol 12.5 mg. ED course and data review: Heart rate in the 70s to 80s on arrival, respirations in the teens to low 20s, O2 sat 87% on 4 L subsequently requiring up to 15 L HFNC to maintain sats in the high end 90s subsequently down titrated to 10 L  HFNC by admission with sats in the mid 90s. Workup notable for  normal CBC Troponin of 37 (was in the 50s last admission) BNP 1847 (up from 1263 a couple weeks prior) Creatinine 1.77 up from 1.55 UA with large leukocytes EKG, personally viewed and interpreted showing a flutter with variable AV block at a rate of 78 with T wave inversion in lateral leads CT chest with contrast showing no acute abnormality and otherwise stable findings from prior exams  The ED provider spoke with cardiologist, Dr. Darrold Junker who per EDP had nothing additional to add regarding treatment of heart failure  Patient treated for COPD with DuoNebs and Solu-Medrol with some improvement  Hospitalist consulted for admission.     Past Medical History:  Diagnosis Date   Acute exacerbation of CHF (congestive heart failure) (HCC) 11/18/2021   AKI (acute kidney injury) (HCC) 11/26/2021   Anemia    CHF (congestive heart failure) (HCC)    Diabetes mellitus without complication (HCC) 03/13/2017   Currently no high BS.  Has come off meds.  But having episodes of low BS now.   Dilated idiopathic cardiomyopathy (HCC)    Dizziness 02/18/2017   GERD (gastroesophageal reflux disease)    Hyperkalemia 11/26/2021   Hyperlipidemia    Hypertension    Hyponatremia 06/09/2017   Hypothyroidism    Osteoporosis    Renal disorder    stage 3   Past Surgical History:  Procedure Laterality Date   BUNIONECTOMY     COLONOSCOPY     ESOPHAGOGASTRODUODENOSCOPY (EGD) WITH PROPOFOL N/A 10/15/2017  Procedure: ESOPHAGOGASTRODUODENOSCOPY (EGD) WITH PROPOFOL;  Surgeon: Christena Deem, MD;  Location: [facility] ENDOSCOPY;  Service: Endoscopy;  Laterality: N/A;   KYPHOPLASTY N/A 06/12/2017   Procedure: [id number];  Surgeon: Kennedy Bucker, MD;  Location: [facility] ORS;  Service: Orthopedics;  Laterality: N/A;   TONSILLECTOMY     Social History:  reports that she quit smoking about 39 years ago. Her smoking use included cigarettes. She has  never used smokeless tobacco. She reports that she does not drink alcohol and does not use drugs.  Allergies  Allergen Reactions   Ciprofloxacin    Digoxin And Related    Entresto [Sacubitril-Valsartan] Other (See Comments)    Hypotensive    Erythromycin    Lopid [Gemfibrozil]    Nitrofurantoin    Spironolactone Other (See Comments)    Hospitalized for dehydration   Cefuroxime Rash   Penicillins Rash    Has patient had a PCN reaction causing immediate rash, facial/tongue/throat swelling, SOB or lightheadedness with hypotension: Unknown Has patient had a PCN reaction causing severe rash involving mucus membranes or skin necrosis: No Has patient had a PCN reaction that required hospitalization: No Has patient had a PCN reaction occurring within the last 10 years: No If all of the above answers are "NO", then may proceed with Cephalosporin use.     Family History  Problem Relation Age of Onset   Premature CHD Brother     Prior to Admission medications   Medication Sig Start Date End Date Taking? Authorizing Provider  acetaminophen (TYLENOL) 325 MG tablet Take 2 tablets (650 mg total) by mouth every 4 (four) hours as needed for headache or mild pain. 11/29/21   Pennie Banter, DO  alendronate (FOSAMAX) 70 MG tablet Take 70 mg by mouth once a week. Take with a full glass of water on an empty stomach.    [provider]  apixaban (ELIQUIS) 2.5 MG TABS tablet Take 1 tablet (2.5 mg total) by mouth 2 (two) times daily. Start on 11/30/2021 11/29/21   Pennie Banter, DO  arformoterol (BROVANA) 15 MCG/2ML NEBU Take 2 mLs (15 mcg total) by nebulization in the morning and at bedtime. 05/06/23   Assaker, [place] Bali, MD  ARTIFICIAL TEAR SOLUTION OP Apply 1 drop to eye 2 (two) times daily. Both Eyes.    [provider]  atorvastatin (LIPITOR) 80 MG tablet Take 1 tablet (80 mg total) by mouth daily. 11/30/21   Esaw Grandchild A, DO  budesonide (PULMICORT) 0.25 MG/2ML nebulizer  solution Take 2 mLs (0.25 mg total) by nebulization in the morning and at bedtime. 05/06/23   Assaker, [place]ali, MD  Cholecalciferol (D3-1000) 25 MCG (1000 UT) capsule Take 1,000 Units by mouth daily.    [provider]  Dextromethorphan-guaiFENesin 20-400 MG TABS Take 1 tablet by mouth 2 (two) times daily.    [provider]  diclofenac Sodium (VOLTAREN) 1 % GEL Apply 2 g topically daily as needed (pain). Apply to back topically every day shift for pain.    [provider]  ferrous sulfate 325 (65 FE) MG EC tablet Take 325 mg by mouth. Every Monday, Wednesday and Friday.    [provider]  hydrocortisone (ANUSOL-HC) 25 MG suppository Place 25 mg rectally 2 (two) times daily as needed for hemorrhoids or anal itching.    [provider]  ipratropium (ATROVENT) 0.02 % nebulizer solution Take 2.5 mLs (0.5 mg total) by nebulization 4 (four) times daily. 08/12/23   Janann Colonel, MD  levalbuterol Pauline Aus) 1.25 MG/0.5ML  nebulizer solution Take 1.25 mg by nebulization every 4 (four) hours as needed for wheezing or shortness of breath. 08/10/23 08/09/24  Assaker, [place]ali, MD  levothyroxine (SYNTHROID) 50 MCG tablet Take 50 mcg by mouth daily before breakfast.    [provider]  Nutritional Supplements (,FEEDING SUPPLEMENT, PROSOURCE PLUS) liquid Take 30 mLs by mouth daily.    [provider]  Nutritional Supplements (ENSURE ENLIVE PO) Take 1 Can by mouth as directed. After meals if she eats less than 50 % due to poor appetite and weight loss s/p covid    [provider]  OXYGEN 4lpm via [place] for SOB continuous.    [provider]  pantoprazole (PROTONIX) 40 MG tablet Take 40 mg by mouth in the morning and at bedtime.    [provider]  polyethylene glycol (MIRALAX / GLYCOLAX) 17 g packet Take 17 g by mouth every other day.    [provider]  potassium chloride (KLOR-CON) 20 MEQ packet Take 20 mEq by  mouth daily.    [provider]  revefenacin (YUPELRI) 175 MCG/3ML nebulizer solution Take 3 mLs (175 mcg total) by nebulization daily. 08/10/23   Assaker, [place] Bali, MD  senna-docusate (SENOKOT-S) 8.6-50 MG tablet Take 2 tablets by mouth daily.    [provider]  sertraline (ZOLOFT) 50 MG tablet Take 50 mg by mouth daily.    [provider]  torsemide (DEMADEX) 20 MG tablet Take torsemide 20 mg every Monday, Wednesday, Friday and Sunday. Take torsemide 40 mg every Tuesday, Thursday, Saturday 08/03/23   Lurene Shadow, MD    Physical Exam: Vitals:   08/12/23 2030 08/12/23 2032 08/12/23 2108 08/12/23 2114  BP: 126/67  122/71   Pulse: 82  79   Resp: 20  (!) 22   Temp:    98 F (36.7 C)  TempSrc:    Oral  SpO2: 97% 96% 95%   Weight:      Height:       Physical Exam Vitals and nursing note reviewed.  Constitutional:      General: She is not in acute distress.    Comments: Frail chronically ill-appearing female  HENT:     Head: Normocephalic and atraumatic.  Cardiovascular:     Rate and Rhythm: Normal rate and regular rhythm.     Heart sounds: Normal heart sounds.  Pulmonary:     Effort: Tachypnea present.     Breath sounds: Normal breath sounds. Decreased air movement present.  Abdominal:     Palpations: Abdomen is soft.     Tenderness: There is no abdominal tenderness.  Neurological:     General: No focal deficit present.     Mental Status: She is lethargic.     Labs on Admission: I have personally reviewed following labs and imaging studies  CBC: Recent Labs  Lab 08/12/23 1604  WBC 7.6  HGB 13.4  HCT 44.8  MCV 82.5  PLT 178   Basic Metabolic Panel: Recent Labs  Lab 08/07/23 1412 08/12/23 1604  NA 139 139  K 4.7 4.7  CL 105 109  CO2 20 22  GLUCOSE 94 103*  BUN 51* 60*  CREATININE 1.75* 1.77*  CALCIUM 8.5* 8.8*   GFR: Estimated Creatinine Clearance: 14.9 mL/min (A) (by C-G formula based on SCr of 1.77 mg/dL (H)). Liver  Function Tests: No results for input(s): "AST", "ALT", "ALKPHOS", "BILITOT", "PROT", "ALBUMIN" in the last 168 hours. No results for input(s): "LIPASE", "AMYLASE" in the last 168 hours. No results for  input(s): "AMMONIA" in the last 168 hours. Coagulation Profile: No results for input(s): "INR", "PROTIME" in the last 168 hours. Cardiac Enzymes: No results for input(s): "CKTOTAL", "CKMB", "CKMBINDEX", "TROPONINI" in the last 168 hours. BNP (last 3 results) No results for input(s): "PROBNP" in the last 8760 hours. HbA1C: No results for input(s): "HGBA1C" in the last 72 hours. CBG: No results for input(s): "GLUCAP" in the last 168 hours. Lipid Profile: No results for input(s): "CHOL", "HDL", "LDLCALC", "TRIG", "CHOLHDL", "LDLDIRECT" in the last 72 hours. Thyroid Function Tests: No results for input(s): "TSH", "T4TOTAL", "FREET4", "T3FREE", "THYROIDAB" in the last 72 hours. Anemia Panel: No results for input(s): "VITAMINB12", "FOLATE", "FERRITIN", "TIBC", "IRON", "RETICCTPCT" in the last 72 hours. Urine analysis:    Component Value Date/Time   COLORURINE YELLOW (A) 08/12/2023 1621   APPEARANCEUR CLOUDY (A) 08/12/2023 1621   APPEARANCEUR Clear 06/13/2014 2201   LABSPEC 1.009 08/12/2023 1621   LABSPEC 1.008 06/13/2014 2201   PHURINE 5.0 08/12/2023 1621   GLUCOSEU NEGATIVE 08/12/2023 1621   GLUCOSEU 150 mg/dL 16/03/9603 5409   HGBUR MODERATE (A) 08/12/2023 1621   BILIRUBINUR NEGATIVE 08/12/2023 1621   BILIRUBINUR Negative 06/13/2014 2201   KETONESUR NEGATIVE 08/12/2023 1621   PROTEINUR NEGATIVE 08/12/2023 1621   NITRITE NEGATIVE 08/12/2023 1621   LEUKOCYTESUR LARGE (A) 08/12/2023 1621   LEUKOCYTESUR Trace 06/13/2014 2201    Radiological Exams on Admission: CT Chest Wo Contrast Result Date: 08/12/2023 CLINICAL DATA:  Chronic shortness of breath EXAM: CT CHEST WITHOUT CONTRAST TECHNIQUE: Multidetector CT imaging of the chest was performed following the standard protocol without IV  contrast. RADIATION DOSE REDUCTION: This exam was performed according to the departmental dose-optimization program which includes automated exposure control, adjustment of the mA and/or kV according to patient size and/or use of iterative reconstruction technique. COMPARISON:  Chest x-ray from earlier in the same day, CT from 11/25/2021 FINDINGS: Cardiovascular: Somewhat limited due to lack of IV contrast. Atherosclerotic calcifications are identified without aneurysmal dilatation of the aorta. Coronary calcifications are seen. The heart is not significantly enlarged in size. No pericardial effusion is noted. Mediastinum/Nodes: Thoracic inlet is within normal limits. No hilar or mediastinal adenopathy is noted. The esophagus as visualized is within normal limits. Lungs/Pleura: Diffuse emphysematous changes are noted. Scattered calcified granulomas are noted bilaterally. Stable 5 mm nodule is noted in the left upper lobe best seen on image number 33 of series 3 unchanged from 2020 and felt to be benign in etiology. Stable noncalcified right lower lobe best seen on image number 54 of series 3 dating back to 2020. No new focal nodules are seen. No focal infiltrate or sizable effusion is seen. Upper Abdomen: Visualized upper abdomen shows no acute abnormality. Musculoskeletal: Degenerative changes of the thoracic spine are noted. Prior T6 vertebral augmentation is noted. IMPRESSION: Scattered calcified and noncalcified nodules stable from multiple previous exams. No further follow-up is recommended. Previously seen ground-glass opacity in the right middle lobe has resolved. No acute abnormality is noted. Aortic Atherosclerosis (ICD10-I70.0) and Emphysema (ICD10-J43.9). Electronically Signed   By: Alcide Clever M.D.   On: 08/12/2023 20:35   DG Chest Portable 1 View Result Date: 08/12/2023 CLINICAL DATA:  Shortness of breath EXAM: PORTABLE CHEST 1 VIEW COMPARISON:  08/03/2023, 06/22/2011 FINDINGS: The heart size and  mediastinal contours are within normal limits. Aortic atherosclerosis. No focal airspace consolidation, pleural effusion, or pneumothorax. Stable chondroid lesion in the proximal left humerus, benign. IMPRESSION: No active disease. Electronically Signed   By: Duanne Guess D.O.  On: 08/12/2023 17:36     Data Reviewed: Relevant notes from primary care and specialist visits, past discharge summaries as available in EHR, including Care Everywhere. Prior diagnostic testing as pertinent to current admission diagnoses Updated medications and problem lists for reconciliation ED course, including vitals, labs, imaging, treatment and response to treatment Triage notes, nursing and pharmacy notes and ED provider's notes Notable results as noted in HPI   Assessment and Plan: * Acute on chronic respiratory failure with hypoxia and hypercapnia (HCC) Suspect related to combination of COPD and CHF, superimposed on underlying baseline frailty, physical deconditioning and advanced age CTA chest was nonacute Patient on HFNC at 10 L, at baseline requires 4 L Had recent adjustment in home inhalers on 2/24 and also had doubling of Lasix dose on the same date without improvement Will treat both COPD and CHF as outlined on the specific problem Continue high flow nasal cannula to wean as tolerated Will get respiratory viral panel to evaluate for acute respiratory tract infection  COPD with acute exacerbation (HCC) Scheduled and as needed nebulizers --will use Xopenex as needed due to problems with rapid heart rate Patient recently had an adjustment in her inhalers (budesonide twice daily, Xopenex and Brovana was replaced with Mikael Spray) IV steroids Antitussives flutter valve Supplemental oxygen  Acute on chronic HFrEF (heart failure with reduced ejection fraction) (HCC) Nonischemic cardiomyopathy, EF 40 to 45% 07/31/2023 EF 40-45% 07/31/23, down from 50% 02/2023 but previously 20-25% 2023, Recent increase in  outpatient Lasix from 20 mg daily to 40 on 2/24 BNP remains elevated at 1847, up from 1263 at discharge a couple days prior, however CTA chest not showing pulmonary congestion Patient did require high-dose Lasix up to 80 twice daily during recent admission for CHF Will treat with IV Lasix instead of oral while hospitalized Daily weights with intake and output monitoring   Chest pain Elevated troponin.  History of NSTEMI 11/2021 Last cardiac cath was in 2014 with normal coronaries.  Repeat discussed with cardiologist in 2018 but deferred following shared decision making Believe chest pain and elevated troponin are related to increased work of breathing and demand ischemia from CHF/COPD exacerbation Will continue to trend troponin Continue atorvastatin Cardiology consult  Acute renal failure superimposed on stage 3a chronic kidney disease (HCC) Troponin elevation above baseline suspecting cardiorenal syndrome Monitor renal function as patient has been placed on IV Lasix albeit lower dose  Chronic atrial fibrillation (HCC) Continue apixaban Does not appear to be on a rate control agent for uncertain reasons,?  Pulmonary disease Was treated with a dose of metoprolol at her facility for heart rate in the 200s Will defer to cardiology  Frailty Patient with advanced age, debility frequent admissions multiple chronic medical conditions Mortality in 6 months will not be unexpected Palliative care consult might be appropriate  History of cerebrovascular accident (CVA) with residual deficit Has residual left hemiparesis and tremors PT consult Continue apixaban  Hypothyroidism Continue levothyroxine Last TSH a month ago was 5.8  Depression Continue sertraline  Age-related osteoporosis without current pathological fracture No concerning issues this admission    DVT prophylaxis: Apixaban  Consults: cardiology, [facility]  Advance Care Planning:   Code Status: Limited: Do not attempt  resuscitation (DNR) -DNR-LIMITED -Do Not Intubate/DNI    Family Communication: none  Disposition Plan: Back to previous home environment  Severity of Illness: The appropriate patient status for this patient is INPATIENT. Inpatient status is judged to be reasonable and necessary in order to provide the required intensity  of service to ensure the patient's safety. The patient's presenting symptoms, physical exam findings, and initial radiographic and laboratory data in the context of their chronic comorbidities is felt to place them at high risk for further clinical deterioration. Furthermore, it is not anticipated that the patient will be medically stable for discharge from the hospital within 2 midnights of admission.   * I certify that at the point of admission it is my clinical judgment that the patient will require inpatient hospital care spanning beyond 2 midnights from the point of admission due to high intensity of service, high risk for further deterioration and high frequency of surveillance required.*  Author: Andris Baumann, MD 08/12/2023 10:10 PM  For on call review [email or web address].

## 2023-08-12 NOTE — Assessment & Plan Note (Signed)
 Has residual left hemiparesis and tremors PT consult Continue apixaban

## 2023-08-12 NOTE — Assessment & Plan Note (Signed)
 Continue apixaban Does not appear to be on a rate control agent for uncertain reasons,?  Pulmonary disease Was treated with a dose of metoprolol at her facility for heart rate in the 200s Will defer to cardiology

## 2023-08-12 NOTE — Assessment & Plan Note (Signed)
 No concerning issues this admission

## 2023-08-12 NOTE — Assessment & Plan Note (Signed)
 Nonischemic cardiomyopathy, EF 40 to 45% 07/31/2023 EF 40-45% 07/31/23, down from 50% 02/2023 but previously 20-25% 2023, Recent increase in outpatient Lasix from 20 mg daily to 40 on 2/24 BNP remains elevated at 1847, up from 1263 at discharge a couple days prior, however CTA chest not showing pulmonary congestion Patient did require high-dose Lasix up to 80 twice daily during recent admission for CHF Will treat with IV Lasix instead of oral while hospitalized Daily weights with intake and output monitoring

## 2023-08-12 NOTE — Assessment & Plan Note (Signed)
 Patient with advanced age, debility frequent admissions multiple chronic medical conditions Mortality in 6 months will not be unexpected Palliative care consult might be appropriate

## 2023-08-12 NOTE — ED Notes (Signed)
 CCMD called and pt placed on tele.

## 2023-08-12 NOTE — Assessment & Plan Note (Signed)
 Continue levothyroxine Last TSH a month ago was 5.8

## 2023-08-12 NOTE — Assessment & Plan Note (Addendum)
 Suspect related to combination of COPD and CHF, superimposed on underlying baseline frailty, physical deconditioning and advanced age CTA chest was nonacute Patient on HFNC at 10 L, at baseline requires 4 L Had recent adjustment in home inhalers on 2/24 and also had doubling of Lasix dose on the same date without improvement Will treat both COPD and CHF as outlined on the specific problem Continue high flow nasal cannula to wean as tolerated Will get respiratory viral panel to evaluate for acute respiratory tract infection

## 2023-08-12 NOTE — ED Triage Notes (Signed)
 Patient's daughter reports patient complaining of intermittent shortness of breath since last admission. Today reports shortness of breath has been continuous along with chest pain.

## 2023-08-12 NOTE — Assessment & Plan Note (Signed)
 Troponin elevation above baseline suspecting cardiorenal syndrome Monitor renal function as patient has been placed on IV Lasix albeit lower dose

## 2023-08-12 NOTE — Assessment & Plan Note (Addendum)
 Elevated troponin.  History of NSTEMI 11/2021 Last cardiac cath was in 2014 with normal coronaries.  Repeat discussed with cardiologist in 2018 but deferred following shared decision making Believe chest pain and elevated troponin are related to increased work of breathing and demand ischemia from CHF/COPD exacerbation Will continue to trend troponin Continue atorvastatin Cardiology consult

## 2023-08-12 NOTE — Assessment & Plan Note (Signed)
 Continue sertraline

## 2023-08-12 NOTE — Assessment & Plan Note (Addendum)
 Scheduled and as needed nebulizers --will use Xopenex as needed due to problems with rapid heart rate Patient recently had an adjustment in her inhalers (budesonide twice daily, Xopenex and Brovana was replaced with Mikael Spray) IV steroids Antitussives flutter valve Supplemental oxygen

## 2023-08-12 NOTE — ED Provider Notes (Signed)
 [facility] Provider Note   Event Date/Time   First MD Initiated Contact with Patient 08/12/23 1614     (approximate) History  Chest Pain  HPI Jacqueline [name] is a 89 y.o. female with a past medical history of chronic heart failure with preserved ejection fraction, reactive airway disease post-COVID on 4 L oxygen baseline, chronic atrial fibrillation, stage III CKD, type 2 diabetes, and hyperlipidemia who presents with daughter complaining of patient having significantly worsening shortness of breath  today.  Patient's daughter states that she had perioral cyanosis as well as cyanosis of bilateral hands and feet despite being on her normal 4 L.  Patient was also having increased work of breathing that has been waxing and waning for the last day. ROS: Patient currently denies any vision changes, tinnitus, difficulty speaking, facial droop, sore throat, chest pain, abdominal pain, nausea/vomiting/diarrhea, dysuria, or weakness/numbness/paresthesias in any extremity   Physical Exam  Triage Vital Signs: ED Triage Vitals  Encounter Vitals Group     BP 08/12/23 1558 109/62     Systolic BP Percentile --      Diastolic BP Percentile --      Pulse Rate 08/12/23 1602 71     Resp 08/12/23 1558 16     Temp --      Temp src --      SpO2 08/12/23 1602 92 %     Weight --      Height --      Head Circumference --      Peak Flow --      Pain Score 08/12/23 1558 7     Pain Loc --      Pain Education --      Exclude from Growth Chart --    Most recent vital signs: Vitals:   08/12/23 2108 08/12/23 2114  BP: 122/71   Pulse: 79   Resp: (!) 22   Temp:  98 F (36.7 C)  SpO2: 95%    General: Awake, oriented x4. CV:  Good peripheral perfusion.  Resp:  Increased effort.  Wheezing on expiration over bilateral lung fields Abd:  No distention.  Other:  Elderly well-developed, well-nourished [place] Asian female resting in stretcher with 10 L high flow nasal cannula in place in  mild respiratory distress ED Results / Procedures / Treatments  Labs (all labs ordered are listed, but only abnormal results are displayed) Labs Reviewed  BASIC METABOLIC PANEL - Abnormal; Notable for the following components:      Result Value   Glucose, Bld 103 (*)    BUN 60 (*)    Creatinine, Ser 1.77 (*)    Calcium 8.8 (*)    GFR, Estimated 27 (*)    All other components within normal limits  CBC - Abnormal; Notable for the following components:   RBC 5.43 (*)    MCH 24.7 (*)    MCHC 29.9 (*)    RDW 21.8 (*)    All other components within normal limits  BRAIN NATRIURETIC PEPTIDE - Abnormal; Notable for the following components:   B Natriuretic Peptide 1,847.1 (*)    All other components within normal limits  URINALYSIS, ROUTINE W REFLEX MICROSCOPIC - Abnormal; Notable for the following components:   Color, Urine YELLOW (*)    APPearance CLOUDY (*)    Hgb urine dipstick MODERATE (*)    Leukocytes,Ua LARGE (*)    All other components within normal limits  TROPONIN I (HIGH SENSITIVITY) - Abnormal; Notable for  the following components:   Troponin I (High Sensitivity) 36 (*)    All other components within normal limits  TROPONIN I (HIGH SENSITIVITY) - Abnormal; Notable for the following components:   Troponin I (High Sensitivity) 37 (*)    All other components within normal limits   EKG ED ECG REPORT I, Merwyn Katos, the attending physician, personally viewed and interpreted this ECG. Date: 08/12/2023 EKG Time: 1555 Rate: 78 Rhythm: Atrial flutter with variable AV block QRS Axis: normal Intervals: normal ST/T Wave abnormalities: normal Narrative Interpretation: Atrial flutter with variable AV block.  No evidence of acute ischemia RADIOLOGY ED MD interpretation: One-view portable chest x-ray interpreted by me shows no evidence of acute abnormalities including no pneumonia, pneumothorax, or widened mediastinum CT chest without contrast shows scattered calcified and  noncalcified nodules stable from previous exams.  There is previously seen groundglass opacity in the right middle lobe that has resolved -Agree with radiology assessment Official radiology report(s): CT Chest Wo Contrast Result Date: 08/12/2023 CLINICAL DATA:  Chronic shortness of breath EXAM: CT CHEST WITHOUT CONTRAST TECHNIQUE: Multidetector CT imaging of the chest was performed following the standard protocol without IV contrast. RADIATION DOSE REDUCTION: This exam was performed according to the departmental dose-optimization program which includes automated exposure control, adjustment of the mA and/or kV according to patient size and/or use of iterative reconstruction technique. COMPARISON:  Chest x-ray from earlier in the same day, CT from 11/25/2021 FINDINGS: Cardiovascular: Somewhat limited due to lack of IV contrast. Atherosclerotic calcifications are identified without aneurysmal dilatation of the aorta. Coronary calcifications are seen. The heart is not significantly enlarged in size. No pericardial effusion is noted. Mediastinum/Nodes: Thoracic inlet is within normal limits. No hilar or mediastinal adenopathy is noted. The esophagus as visualized is within normal limits. Lungs/Pleura: Diffuse emphysematous changes are noted. Scattered calcified granulomas are noted bilaterally. Stable 5 mm nodule is noted in the left upper lobe best seen on image number 33 of series 3 unchanged from 2020 and felt to be benign in etiology. Stable noncalcified right lower lobe best seen on image number 54 of series 3 dating back to 2020. No new focal nodules are seen. No focal infiltrate or sizable effusion is seen. Upper Abdomen: Visualized upper abdomen shows no acute abnormality. Musculoskeletal: Degenerative changes of the thoracic spine are noted. Prior T6 vertebral augmentation is noted. IMPRESSION: Scattered calcified and noncalcified nodules stable from multiple previous exams. No further follow-up is  recommended. Previously seen ground-glass opacity in the right middle lobe has resolved. No acute abnormality is noted. Aortic Atherosclerosis (ICD10-I70.0) and Emphysema (ICD10-J43.9). Electronically Signed   By: Alcide Clever M.D.   On: 08/12/2023 20:35   DG Chest Portable 1 View Result Date: 08/12/2023 CLINICAL DATA:  Shortness of breath EXAM: PORTABLE CHEST 1 VIEW COMPARISON:  08/03/2023, 06/22/2011 FINDINGS: The heart size and mediastinal contours are within normal limits. Aortic atherosclerosis. No focal airspace consolidation, pleural effusion, or pneumothorax. Stable chondroid lesion in the proximal left humerus, benign. IMPRESSION: No active disease. Electronically Signed   By: Duanne Guess D.O.   On: 08/12/2023 17:36   PROCEDURES: Critical Care performed: Yes, see critical care procedure note(s) .1-3 Lead EKG Interpretation  Performed by: Merwyn Katos, MD Authorized by: Merwyn Katos, MD     Interpretation: abnormal     ECG rate:  71   ECG rate assessment: normal     Rhythm: atrial flutter     Ectopy: none     Conduction: normal  CRITICAL CARE Performed by: Merwyn Katos  Total critical care time: 35 minutes  Critical care time was exclusive of separately billable procedures and treating other patients.  Critical care was necessary to treat or prevent imminent or life-threatening deterioration.  Critical care was time spent personally by me on the following activities: development of treatment plan with patient and/or surrogate as well as nursing, discussions with consultants, evaluation of patient's response to treatment, examination of patient, obtaining history from patient or surrogate, ordering and performing treatments and interventions, ordering and review of laboratory studies, ordering and review of radiographic studies, pulse oximetry and re-evaluation of patient's condition.  MEDICATIONS ORDERED IN ED: Medications  ipratropium-albuterol (DUONEB) 0.5-2.5  (3) MG/3ML nebulizer solution 3 mL (3 mLs Nebulization Given 08/12/23 2032)  methylPREDNISolone sodium succinate (SOLU-MEDROL) 125 mg/2 mL injection 125 mg (125 mg Intravenous Given 08/12/23 2049)   IMPRESSION / MDM / ASSESSMENT AND PLAN / ED COURSE  I reviewed the triage vital signs and the nursing notes.                             The patient is on the cardiac monitor to evaluate for evidence of arrhythmia and/or significant heart rate changes. Patient's presentation is most consistent with acute presentation with potential threat to life or bodily function. The patient appears to be suffering from a moderate/severe exacerbation of her reactive airway disease  Based on the history, exam, CXR/EKG reviewed by me, and further workup I don't suspect any other emergent cause of this presentation, such as pneumonia, acute coronary syndrome, congestive heart failure, pulmonary embolism, or pneumothorax.  ED Interventions: bronchodilators, steroids, reassess  Reassessment: After treatment, the patient's shortness of breath is improving but patient is still requiring supplemental oxygenation with high flow nasal cannula  Disposition: Admit   FINAL CLINICAL IMPRESSION(S) / ED DIAGNOSES   Final diagnoses:  Shortness of breath  Acute on chronic hypoxic respiratory failure (HCC)  Moderate persistent reactive airway disease with acute exacerbation   Rx / DC Orders   ED Discharge Orders     None      Note:  This document was prepared using Dragon voice recognition software and may include unintentional dictation errors.   Merwyn Katos, MD 08/12/23 2122

## 2023-08-13 ENCOUNTER — Telehealth: Payer: Self-pay | Admitting: Pulmonary Disease

## 2023-08-13 DIAGNOSIS — J9621 Acute and chronic respiratory failure with hypoxia: Secondary | ICD-10-CM | POA: Diagnosis not present

## 2023-08-13 DIAGNOSIS — J9622 Acute and chronic respiratory failure with hypercapnia: Secondary | ICD-10-CM | POA: Diagnosis not present

## 2023-08-13 LAB — COMPREHENSIVE METABOLIC PANEL
ALT: 16 U/L (ref 0–44)
AST: 31 U/L (ref 15–41)
Albumin: 3.5 g/dL (ref 3.5–5.0)
Alkaline Phosphatase: 64 U/L (ref 38–126)
Anion gap: 8 (ref 5–15)
BUN: 59 mg/dL — ABNORMAL HIGH (ref 8–23)
CO2: 23 mmol/L (ref 22–32)
Calcium: 8.7 mg/dL — ABNORMAL LOW (ref 8.9–10.3)
Chloride: 110 mmol/L (ref 98–111)
Creatinine, Ser: 1.6 mg/dL — ABNORMAL HIGH (ref 0.44–1.00)
GFR, Estimated: 31 mL/min — ABNORMAL LOW (ref >60)
Glucose, Bld: 169 mg/dL — ABNORMAL HIGH (ref 70–99)
Potassium: 4.8 mmol/L (ref 3.5–5.1)
Sodium: 141 mmol/L (ref 135–145)
Total Bilirubin: 1.1 mg/dL (ref 0.0–1.2)
Total Protein: 6.9 g/dL (ref 6.5–8.1)

## 2023-08-13 LAB — CBC
HCT: 43.5 % (ref 36.0–46.0)
Hemoglobin: 13.1 g/dL (ref 12.0–15.0)
MCH: 24.8 pg — ABNORMAL LOW (ref 26.0–34.0)
MCHC: 30.1 g/dL (ref 30.0–36.0)
MCV: 82.2 fL (ref 80.0–100.0)
Platelets: 165 10*3/uL (ref 150–400)
RBC: 5.29 MIL/uL — ABNORMAL HIGH (ref 3.87–5.11)
RDW: 21.6 % — ABNORMAL HIGH (ref 11.5–15.5)
WBC: 4.3 10*3/uL (ref 4.0–10.5)
nRBC: 0 % (ref 0.0–0.2)

## 2023-08-13 MED ORDER — IPRATROPIUM-ALBUTEROL 0.5-2.5 (3) MG/3ML IN SOLN
3.0000 mL | Freq: Three times a day (TID) | RESPIRATORY_TRACT | Status: DC
  Administered 2023-08-14 (×2): 3 mL via RESPIRATORY_TRACT
  Filled 2023-08-13 (×2): qty 3

## 2023-08-13 NOTE — Progress Notes (Addendum)
 [facility] Liaison Note  This patient is currently followed by [facility] outpatient based palliative care services and home based primary care.   Please call with any palliative care  or home based primary care questions or concerns.    [facility] Liaison [phone]

## 2023-08-13 NOTE — Telephone Encounter (Signed)
 Daughter would like for the patient's Care Facility [facility]  [phone] ask for [facility] )to know of the note that was given by Dr. Larinda Buttery:     Please notify that she should use the following:  Xopenex Q6H Scheduled  Ipratropium Neb Q6H schedu  Pulmicort Neb BID.

## 2023-08-13 NOTE — Consult Note (Signed)
 [facility] CLINIC CARDIOLOGY CONSULT NOTE       Patient ID: Jacqueline [name] MRN: [id number] DOB/AGE: 09-30-1932 89 y.o.  Admit date: 08/12/2023 Referring Physician Dr. Lindajo Royal Primary Physician Earnestine Mealing, MD  Primary Cardiologist Dr. Darrold Junker Reason for Consultation AoCHF  HPI: Jacqueline [name] is a 89 y.o. female  with a past medical history of  NICM / HFrEF (LVEF 40-45%, G1 DD), chronic respiratory failure on 4 L at baseline, accessible atrial fibrillation, hypertension, hyperlipidemia, CKD 3, type 2 diabetes, hypothyroidism who presented to the ED on 08/12/2023 for SOB. Cardiology was consulted for further evaluation.   Majority of history obtained via chart review due to patient's baseline mental status.  She was seen earlier this week in heart failure clinic and reportedly had been doing quite well.  However on the 24th she saw pulmonology and was having more issues with her breathing.  Despite increases to her inhaler doses her symptoms worsened and she was brought to the ED for further evaluation.  Workup in the ED notable for creatinine 1.77, potassium 4.7, hemoglobin 13.4, WBC 7.6.  BNP elevated at 1847.  Troponin trend 36 > 37.  She was started on IV Lasix in the ED as well as IV steroids, antibiotics, breathing treatments.  Chest x-ray and CTA chest without any obvious pulmonary edema or vascular congestion.  At the time my evaluation this morning she is resting comfortably in ED stretcher.  Laying flat without any increased work of breathing.  She is still on 10 L of supplemental O2.  She denies any complaints at the time of my visit.  Feels that her breathing is okay and denies any chest pain or palpitation symptoms.  Her lungs sound clear however she does have a good amount of lower extremity edema.  Review of systems complete and found to be negative unless listed above    Past Medical History:  Diagnosis Date   Acute exacerbation of CHF (congestive heart failure) (HCC)  11/18/2021   AKI (acute kidney injury) (HCC) 11/26/2021   Anemia    CHF (congestive heart failure) (HCC)    Diabetes mellitus without complication (HCC) 03/13/2017   Currently no high BS.  Has come off meds.  But having episodes of low BS now.   Dilated idiopathic cardiomyopathy (HCC)    Dizziness 02/18/2017   GERD (gastroesophageal reflux disease)    Hyperkalemia 11/26/2021   Hyperlipidemia    Hypertension    Hyponatremia 06/09/2017   Hypothyroidism    Osteoporosis    Renal disorder    stage 3    Past Surgical History:  Procedure Laterality Date   BUNIONECTOMY     COLONOSCOPY     ESOPHAGOGASTRODUODENOSCOPY (EGD) WITH PROPOFOL N/A 10/15/2017   Procedure: ESOPHAGOGASTRODUODENOSCOPY (EGD) WITH PROPOFOL;  Surgeon: Christena Deem, MD;  Location: [facility] ENDOSCOPY;  Service: Endoscopy;  Laterality: N/A;   KYPHOPLASTY N/A 06/12/2017   Procedure: [id number];  Surgeon: Kennedy Bucker, MD;  Location: [facility] ORS;  Service: Orthopedics;  Laterality: N/A;   TONSILLECTOMY      (Not in a hospital admission)  Social History   Socioeconomic History   Marital status: Widowed    Spouse name: Not on file   Number of children: Not on file   Years of education: Not on file   Highest education level: Not on file  Occupational History   Not on file  Tobacco Use   Smoking status: Former    Current packs/day: 0.00    Types: Cigarettes  Quit date: 04/18/1984    Years since quitting: 39.3   Smokeless tobacco: Never  Vaping Use   Vaping status: Former   Start date: 05/16/1965   Quit date: 05/16/1989  Substance and Sexual Activity   Alcohol use: No   Drug use: No   Sexual activity: Never  Other Topics Concern   Not on file  Social History Narrative   Not on file   Social Drivers of Health   Financial Resource Strain: Not on file  Food Insecurity: No Food Insecurity (08/01/2023)   Hunger Vital Sign    Worried About Running Out of Food in the Last Year: Never true    Ran Out of Food  in the Last Year: Never true  Transportation Needs: No Transportation Needs (08/01/2023)   PRAPARE - Administrator, Civil Service (Medical): No    Lack of Transportation (Non-Medical): No  Physical Activity: Not on file  Stress: Not on file  Social Connections: Moderately Isolated (08/01/2023)   Social Connection and Isolation Panel [NHANES]    Frequency of Communication with Friends and Family: More than three times a week    Frequency of Social Gatherings with Friends and Family: Once a week    Attends Religious Services: 1 to 4 times per year    Active Member of [employer] or Organizations: No    Attends Banker Meetings: Never    Marital Status: Widowed  Intimate Partner Violence: Not At Risk (08/01/2023)   Humiliation, Afraid, Rape, and Kick questionnaire    Fear of Current or Ex-Partner: No    Emotionally Abused: No    Physically Abused: No    Sexually Abused: No    Family History  Problem Relation Age of Onset   Premature CHD Brother      Vitals:   08/13/23 0530 08/13/23 0600 08/13/23 0630 08/13/23 0932  BP: 124/83 (!) 97/54 109/62 106/71  Pulse: 89 91 83 78  Resp: 18 16 15 19   Temp:    97.7 F (36.5 C)  TempSrc:    Axillary  SpO2: 96% 94% 96% 91%  Weight:      Height:        PHYSICAL EXAM General: Chronically ill appearing female, well nourished, in no acute distress. HEENT: Normocephalic and atraumatic. Neck: No JVD.  Lungs: Normal respiratory effort on 10L [place]. Clear bilaterally to auscultation. No wheezes, crackles, rhonchi.  Heart: HRRR. Normal S1 and S2 without gallops or murmurs.  Abdomen: Non-distended appearing.  Msk: Normal strength and tone for age. Extremities: Warm and well perfused. No clubbing, cyanosis. 2+ pitting edema.  Neuro: Alert and oriented X 3. Psych: Answers questions appropriately.   Labs: Basic Metabolic Panel: Recent Labs    08/12/23 1604 08/13/23 0400  NA 139 141  K 4.7 4.8  CL 109 110  CO2 22 23   GLUCOSE 103* 169*  BUN 60* 59*  CREATININE 1.77* 1.60*  CALCIUM 8.8* 8.7*   Liver Function Tests: Recent Labs    08/13/23 0400  AST 31  ALT 16  ALKPHOS 64  BILITOT 1.1  PROT 6.9  ALBUMIN 3.5   No results for input(s): "LIPASE", "AMYLASE" in the last 72 hours. CBC: Recent Labs    08/12/23 1604 08/13/23 0400  WBC 7.6 4.3  HGB 13.4 13.1  HCT 44.8 43.5  MCV 82.5 82.2  PLT 178 165   Cardiac Enzymes: Recent Labs    08/12/23 1604 08/12/23 1836  TROPONINIHS 36* 37*   BNP: Recent Labs  08/12/23 1604  BNP 1,847.1*   D-Dimer: No results for input(s): "DDIMER" in the last 72 hours. Hemoglobin A1C: No results for input(s): "HGBA1C" in the last 72 hours. Fasting Lipid Panel: No results for input(s): "CHOL", "HDL", "LDLCALC", "TRIG", "CHOLHDL", "LDLDIRECT" in the last 72 hours. Thyroid Function Tests: No results for input(s): "TSH", "T4TOTAL", "T3FREE", "THYROIDAB" in the last 72 hours.  Invalid input(s): "FREET3" Anemia Panel: No results for input(s): "VITAMINB12", "FOLATE", "FERRITIN", "TIBC", "IRON", "RETICCTPCT" in the last 72 hours.   Radiology: CT Chest Wo Contrast Result Date: 08/12/2023 CLINICAL DATA:  Chronic shortness of breath EXAM: CT CHEST WITHOUT CONTRAST TECHNIQUE: Multidetector CT imaging of the chest was performed following the standard protocol without IV contrast. RADIATION DOSE REDUCTION: This exam was performed according to the departmental dose-optimization program which includes automated exposure control, adjustment of the mA and/or kV according to patient size and/or use of iterative reconstruction technique. COMPARISON:  Chest x-ray from earlier in the same day, CT from 11/25/2021 FINDINGS: Cardiovascular: Somewhat limited due to lack of IV contrast. Atherosclerotic calcifications are identified without aneurysmal dilatation of the aorta. Coronary calcifications are seen. The heart is not significantly enlarged in size. No pericardial effusion is  noted. Mediastinum/Nodes: Thoracic inlet is within normal limits. No hilar or mediastinal adenopathy is noted. The esophagus as visualized is within normal limits. Lungs/Pleura: Diffuse emphysematous changes are noted. Scattered calcified granulomas are noted bilaterally. Stable 5 mm nodule is noted in the left upper lobe best seen on image number 33 of series 3 unchanged from 2020 and felt to be benign in etiology. Stable noncalcified right lower lobe best seen on image number 54 of series 3 dating back to 2020. No new focal nodules are seen. No focal infiltrate or sizable effusion is seen. Upper Abdomen: Visualized upper abdomen shows no acute abnormality. Musculoskeletal: Degenerative changes of the thoracic spine are noted. Prior T6 vertebral augmentation is noted. IMPRESSION: Scattered calcified and noncalcified nodules stable from multiple previous exams. No further follow-up is recommended. Previously seen ground-glass opacity in the right middle lobe has resolved. No acute abnormality is noted. Aortic Atherosclerosis (ICD10-I70.0) and Emphysema (ICD10-J43.9). Electronically Signed   By: Alcide Clever M.D.   On: 08/12/2023 20:35   DG Chest Portable 1 View Result Date: 08/12/2023 CLINICAL DATA:  Shortness of breath EXAM: PORTABLE CHEST 1 VIEW COMPARISON:  08/03/2023, 06/22/2011 FINDINGS: The heart size and mediastinal contours are within normal limits. Aortic atherosclerosis. No focal airspace consolidation, pleural effusion, or pneumothorax. Stable chondroid lesion in the proximal left humerus, benign. IMPRESSION: No active disease. Electronically Signed   By: Duanne Guess D.O.   On: 08/12/2023 17:36   DG Chest Port 1 View Result Date: 08/03/2023 CLINICAL DATA:  Shortness of breath.  History of pulmonary edema. EXAM: PORTABLE CHEST 1 VIEW COMPARISON:  Chest radiograph dated 07/30/2023. FINDINGS: No focal consolidation, pleural effusion, or pneumothorax. The cardiac silhouette is within limits.  Atherosclerotic calcification of the aorta. Osteopenia with degenerative changes of the spine. No acute osseous pathology. Similar sclerotic lesion of the proximal left humerus. IMPRESSION: No active disease. Electronically Signed   By: Elgie Collard M.D.   On: 08/03/2023 17:55   ECHOCARDIOGRAM COMPLETE Result Date: 07/31/2023    ECHOCARDIOGRAM REPORT   Patient Name:   Jacqueline [name] Date of Exam: 07/31/2023 Medical Rec #:  [id number]     Height:       57.0 in Accession #:    [id number]    Weight:  114.0 lb Date of Birth:  1933-04-21     BSA:          1.416 m Patient Age:    89 years      BP:           103/65 mmHg Patient Gender: F             HR:           86 bpm. Exam Location:  [facility] Procedure: 2D Echo, Cardiac Doppler and Color Doppler (Both Spectral and Color            Flow Doppler were utilized during procedure). Indications:     Acute Respiratory Distress  History:         Patient has prior history of Echocardiogram examinations, most                  recent 11/27/2021. CHF and Cardiomyopathy, Previous Myocardial                  Infarction, COPD and Stroke, Arrythmias:Atrial Fibrillation;                  Risk Factors:Hypertension, Diabetes and Dyslipidemia. CKD.  Sonographer:     Mikki Harbor Referring Phys:  [id number] Andris Baumann Diagnosing Phys: Alwyn Pea MD  Sonographer Comments: Image acquisition challenging due to respiratory motion. IMPRESSIONS  1. Left ventricular ejection fraction, by estimation, is 40 to 45%. The left ventricle has mildly decreased function. The left ventricle demonstrates global hypokinesis. There is mild asymmetric left ventricular hypertrophy of the inferior and basal segments. Left ventricular diastolic parameters are consistent with Grade I diastolic dysfunction (impaired relaxation).  2. Right ventricular systolic function is moderately reduced. The right ventricular size is moderately enlarged. There is mildly elevated pulmonary artery systolic pressure.   3. The mitral valve is grossly normal. Mild mitral valve regurgitation.  4. The aortic valve is normal in structure. Aortic valve regurgitation is mild. FINDINGS  Left Ventricle: Left ventricular ejection fraction, by estimation, is 40 to 45%. The left ventricle has mildly decreased function. The left ventricle demonstrates global hypokinesis. Strain imaging was not performed. The left ventricular internal cavity  size was normal in size. There is mild asymmetric left ventricular hypertrophy of the inferior and basal segments. Left ventricular diastolic parameters are consistent with Grade I diastolic dysfunction (impaired relaxation). Right Ventricle: The right ventricular size is moderately enlarged. No increase in right ventricular wall thickness. Right ventricular systolic function is moderately reduced. There is mildly elevated pulmonary artery systolic pressure. The tricuspid regurgitant velocity is 2.71 m/s, and with an assumed right atrial pressure of 15 mmHg, the estimated right ventricular systolic pressure is 44.4 mmHg. Left Atrium: Left atrial size was normal in size. Right Atrium: Right atrial size was normal in size. Pericardium: There is no evidence of pericardial effusion. Mitral Valve: The mitral valve is grossly normal. Mild to moderate mitral annular calcification. Mild mitral valve regurgitation. MV peak gradient, 4.9 mmHg. The mean mitral valve gradient is 2.0 mmHg. Tricuspid Valve: The tricuspid valve is normal in structure. Tricuspid valve regurgitation is mild. Aortic Valve: The aortic valve is normal in structure. Aortic valve regurgitation is mild. Aortic valve mean gradient measures 3.0 mmHg. Aortic valve peak gradient measures 5.8 mmHg. Aortic valve area, by VTI measures 1.85 cm. Pulmonic Valve: The pulmonic valve was normal in structure. Pulmonic valve regurgitation is mild to moderate. Aorta: The ascending aorta was not well visualized. IAS/Shunts: No atrial level  shunt detected by  color flow Doppler. Additional Comments: 3D imaging was not performed.  LEFT VENTRICLE PLAX 2D LVIDd:         4.30 cm   Diastology LVIDs:         3.30 cm   LV e' lateral:   9.36 cm/s LV PW:         1.30 cm   LV E/e' lateral: 11.3 LV IVS:        1.00 cm LVOT diam:     2.00 cm LV SV:         39 LV SV Index:   28 LVOT Area:     3.14 cm  RIGHT VENTRICLE RV Basal diam:  3.95 cm RV Mid diam:    5.20 cm LEFT ATRIUM             Index        RIGHT ATRIUM           Index LA diam:        4.10 cm 2.89 cm/m   RA Area:     20.10 cm LA Vol (A2C):   62.8 ml 44.34 ml/m  RA Volume:   61.90 ml  43.70 ml/m LA Vol (A4C):   49.0 ml 34.59 ml/m LA Biplane Vol: 56.1 ml 39.61 ml/m  AORTIC VALVE                    PULMONIC VALVE AV Area (Vmax):    1.95 cm     PV Vmax:       0.76 m/s AV Area (Vmean):   1.80 cm     PV Peak grad:  2.3 mmHg AV Area (VTI):     1.85 cm AV Vmax:           120.67 cm/s AV Vmean:          78.367 cm/s AV VTI:            0.212 m AV Peak Grad:      5.8 mmHg AV Mean Grad:      3.0 mmHg LVOT Vmax:         74.83 cm/s LVOT Vmean:        44.833 cm/s LVOT VTI:          0.125 m LVOT/AV VTI ratio: 0.59  AORTA Ao Root diam: 3.20 cm Ao Asc diam:  3.30 cm MITRAL VALVE                TRICUSPID VALVE MV Area (PHT): 3.72 cm     TR Peak grad:   29.4 mmHg MV Area VTI:   1.68 cm     TR Vmax:        271.00 cm/s MV Peak grad:  4.9 mmHg MV Mean grad:  2.0 mmHg     SHUNTS MV Vmax:       1.11 m/s     Systemic VTI:  0.12 m MV Vmean:      65.7 cm/s    Systemic Diam: 2.00 cm MV Decel Time: 204 msec MV E velocity: 106.00 cm/s Alwyn Pea MD Electronically signed by Alwyn Pea MD Signature Date/Time: 07/31/2023/10:02:24 PM    Final    DG Chest 2 View Result Date: 07/30/2023 CLINICAL DATA:  Shortness of breath EXAM: CHEST - 2 VIEW COMPARISON:  X-ray 02/22/2023 and older. FINDINGS: Normal cardiopericardial silhouette with calcified aorta. There is fullness of the hila bilaterally which could be enlarged pulmonary arteries.  No pneumothorax,  effusion or edema. Film is under penetrated. There is compression deformity of midthoracic spine vertebral level with some augmentation cement, unchanged. Sclerotic lesion along the left proximal humerus is again noted and could be a chondroid lesion. IMPRESSION: Enlarged central pulmonary arteries as on prior. No consolidation or effusion. Stable compression deformity of a midthoracic spine vertebral level with augmentation cement Electronically Signed   By: Karen Kays M.D.   On: 07/30/2023 17:17    ECHO 07/31/23: 1. Left ventricular ejection fraction, by estimation, is 40 to 45%. The left ventricle has mildly decreased function. The left ventricle demonstrates global hypokinesis. There is mild asymmetric left ventricular hypertrophy of the inferior and basal segments. Left ventricular diastolic parameters are consistent with Grade I diastolic dysfunction (impaired relaxation).   2. Right ventricular systolic function is moderately reduced. The right ventricular size is moderately enlarged. There is mildly elevated pulmonary artery systolic pressure.   3. The mitral valve is grossly normal. Mild mitral valve regurgitation.   4. The aortic valve is normal in structure. Aortic valve regurgitation is mild.   TELEMETRY reviewed by me 08/13/2023: atrial flutter rate 80s  EKG reviewed by me: atrial flutter rate 78 bpm  Data reviewed by me 08/13/2023: last 24h vitals tele labs imaging I/O ED provider note, admission H&P  Principal Problem:   Acute on chronic respiratory failure with hypoxia and hypercapnia (HCC) Active Problems:   Hypothyroidism   Chest pain   Elevated troponin   Acute renal failure superimposed on stage 3a chronic kidney disease (HCC)   Depression   Chronic atrial fibrillation (HCC)   Age-related osteoporosis without current pathological fracture   Dilated idiopathic cardiomyopathy (HCC)   COPD with acute exacerbation (HCC)   History of cerebrovascular accident  (CVA) with residual deficit   Acute on chronic HFrEF (heart failure with reduced ejection fraction) (HCC)   Frailty    ASSESSMENT AND PLAN:  Jacqueline [name] is a 89 y.o. female  with a past medical history of  NICM / HFrEF (LVEF 40-45%, G1 DD), chronic respiratory failure on 4 L at baseline, accessible atrial fibrillation, hypertension, hyperlipidemia, CKD 3, type 2 diabetes, hypothyroidism who presented to the ED on 08/12/2023 for SOB. Cardiology was consulted for further evaluation.   # Acute on chronic HFrEF # Non-ischemic cardiomyopathy Patient initially sent to the ED for worsening shortness of breath and hypoxia.  Found to be in COPD exacerbation as well as acute heart failure exacerbation with elevated BNP at 1800.  Echo last admission showed EF 40-45%.  Renal function improved with diuresis, creatinine 1.6 today. -Continue IV lasix 40 mg twice daily. May consider higher dose pending how she diureses.  -Further additions to GDMT limited by BP and renal function. -Minimally elevated and flat trending troponins most consistent with demand/supply mismatch and not ACS, she denies chest pain.  # Persistent atrial fibrillation/flutter Patient maintaining in atrial fibrillation/flutter with controlled heart rate. -Continue Eliquis 2.5 mg twice daily for stroke risk reduction (dose appropriate given age greater than 80 years and weight less than 60 kg). -Not currently on any rate control medications due to hypotension.    This patient's plan of care was discussed and created with Dr. Darrold Junker and he is in agreement.  Signed: Gale Journey, PA-C  08/13/2023, 12:09 PM [facility]

## 2023-08-13 NOTE — ED Notes (Signed)
 Pt had urinary incontinence. This tech and Cloyde Reams cleaned pt up, provided peri care and placed pt in new brief with new under pads. Pt is now resting comfortably with daughter at bedside.

## 2023-08-13 NOTE — Progress Notes (Signed)
 PROGRESS NOTE  [name]    DOB: 12-Feb-1933, 89 y.o.  [name]    Code Status: Limited: Do not attempt resuscitation (DNR) -DNR-LIMITED -Do Not Intubate/DNI    DOA: 08/12/2023   LOS: 1   Brief hospital course  Jacqueline [name] is a 89 y.o. female with medical history significant for NSTEMI 11/2021, T2DM, paroxysmal atrial fibrillation on Eliquis, CVA 11/2021, HTN, hypothyroidism, anemia, COPD on home O2 at 4 L, HFrEF with improved EF secondary to NICM, (EF 40-45% 07/31/23, down from 50% 02/2023 but previously 20-25% 2023), and recently hospitalized from 2/13 to 08/03/2023 with respiratory failure attributed to acute on chronic HFrEF, improved with high-dose Lasix. They are presenting with worsening respiratory failure, requiring 10 L high flow nasal cannula in the ED, now being attributed to COPD.  ED course: HR 70s to 80s on arrival, respirations in the teens to low 20s, O2 sat 87% on 4 L subsequently requiring up to 15 L HFNC to maintain sats in the high end 90s subsequently down titrated to 10 L HFNC by admission with sats in the mid 90s.normal CBC Troponin of 37 (was in the 50s last admission) BNP 1847 (up from 1263 a couple weeks prior) Creatinine 1.77 up from 1.55 UA with large leukocytes EKG: a flutter with variable AV block at a rate of 78 with T wave inversion in lateral leads CT chest with contrast showing no acute abnormality and otherwise stable findings from prior exams  Patient treated for COPD with DuoNebs and Solu-Medrol with some improvement.  08/13/23 -remains on high level of oxygen. Appears comfortable. Lung example remarkable for shallow breaths. Rate controlled on monitor. Cardiology following  Assessment & Plan  Principal Problem:   Acute on chronic respiratory failure with hypoxia and hypercapnia (HCC) Active Problems:   COPD with acute exacerbation (HCC)   Chest pain   Elevated troponin   Dilated idiopathic cardiomyopathy (HCC)   Acute on chronic HFrEF  (heart failure with reduced ejection fraction) (HCC)   Acute renal failure superimposed on stage 3a chronic kidney disease (HCC)   Chronic atrial fibrillation (HCC)   Frailty   History of cerebrovascular accident (CVA) with residual deficit   Hypothyroidism   Depression   Age-related osteoporosis without current pathological fracture   Acute on chronic respiratory failure with hypoxia and hypercapnia (HCC) Suspect related to combination of COPD and CHF, superimposed on underlying baseline frailty, physical deconditioning and advanced age CTA chest was nonacute, resp panel negative.  Patient on HFNC at 10 L, at baseline requires 4 L Had recent adjustment in home inhalers on 2/24 and also had doubling of Lasix dose on the same date without improvement - continue breathing treatments - continue lasix with volume status monitoring - wean oxygen to 4L as tolerated - obtain additional resp panel to assess for additional viral contribution  - IV steroids - Antitussives  - flutter valve   Acute on chronic HFrEF Nonischemic cardiomyopathy, EF 40 to 45% 07/31/2023 EF 40-45% 07/31/23, down from 50% 02/2023 but previously 20-25% 2023, BNP remains elevated at 1847, up from 1263 at discharge a couple days prior, however CTA chest not showing pulmonary congestion - cardiology following  - continue lasix   Chest pain  Elevated troponin.  History of NSTEMI 11/2021 Continue atorvastatin Cardiology believes most consistent with demand/mismatch    CKD 3a- Cr appears to be at baseline at 1.60 today   Chronic afib- Was treated with a dose of metoprolol at her facility for heart rate  in the 200s - Continue apixaban - no rate control medications at this time and is rate controlled   Frailty Patient with advanced age, debility frequent admissions multiple chronic medical conditions Mortality in 6 months will not be unexpected Palliative care consult might be appropriate - PT/OT once breathing has  improved   History of cerebrovascular accident (CVA) with residual deficit Has residual left hemiparesis and tremors PT consult Continue apixaban   Hypothyroidism Continue levothyroxine Last TSH a month ago was 5.8   Depression Continue sertraline   Age-related osteoporosis without current pathological fracture No concerning issues this admission  Body mass index is 23.8 kg/m.  VTE ppx: apixaban (ELIQUIS) tablet 2.5 mg Start: 08/12/23 2230 apixaban (ELIQUIS) tablet 2.5 mg   Diet:     Diet   Diet heart healthy/carb modified Room service appropriate? Yes; Fluid consistency: Thin   Consultants: Cardiology   Subjective 08/13/23    Pt reports no concerns but she is lethargic and does not hold conversation. Is able to deny having pain or SOB while resting. Daughter is at bedside and we discussed her concerns, plan.    Objective   Vitals:   08/13/23 0500 08/13/23 0530 08/13/23 0600 08/13/23 0630  BP: 104/67 124/83 (!) 97/54 109/62  Pulse: 89 89 91 83  Resp: 16 18 16 15   Temp:      TempSrc:      SpO2: 98% 96% 94% 96%  Weight:      Height:       No intake or output data in the 24 hours ending 08/13/23 0817 Filed Weights   08/12/23 1653  Weight: 49.9 kg     Physical Exam:  General: awake, alert, NAD. Tired-appearing  HEENT: hard of hearing. Respiratory: normal respiratory effort. Shallow breaths, no rales or wheezing Cardiovascular: quick capillary refill, normal S1/S2, RRR, no JVD, murmurs Gastrointestinal: soft, NT, ND Nervous: Alert and able to follow commands. Left sided tremor and weakness which daughter states has been her baseline for months Extremities: no edema, normal tone Skin: dry, intact, normal temperature, normal color. No rashes, lesions or ulcers on exposed skin Psychiatry: normal mood, congruent affect  Labs   I have personally reviewed the following labs and imaging studies CBC    Component Value Date/Time   WBC 4.3 08/13/2023 0400   RBC  5.29 (H) 08/13/2023 0400   HGB 13.1 08/13/2023 0400   HGB 13.4 06/15/2014 0346   HCT 43.5 08/13/2023 0400   HCT 40.1 06/15/2014 0346   PLT 165 08/13/2023 0400   PLT 206 06/15/2014 0346   MCV 82.2 08/13/2023 0400   MCV 94 06/15/2014 0346   MCH 24.8 (L) 08/13/2023 0400   MCHC 30.1 08/13/2023 0400   RDW 21.6 (H) 08/13/2023 0400   RDW 13.3 06/15/2014 0346   LYMPHSABS 1.6 08/03/2023 1506   LYMPHSABS 1.4 06/15/2014 0346   MONOABS 0.5 08/03/2023 1506   MONOABS 0.6 06/15/2014 0346   EOSABS 0.0 08/03/2023 1506   EOSABS 0.2 06/15/2014 0346   BASOSABS 0.0 08/03/2023 1506   BASOSABS 0.0 06/15/2014 0346      Latest Ref Rng & Units 08/13/2023    4:00 AM 08/12/2023    4:04 PM 08/07/2023    2:12 PM  BMP  Glucose 70 - 99 mg/dL 308  657  94   BUN 8 - 23 mg/dL 59  60  51   Creatinine 0.44 - 1.00 mg/dL 8.46  9.62  9.52   BUN/Creat Ratio 12 - 28   29  Sodium 135 - 145 mmol/L 141  139  139   Potassium 3.5 - 5.1 mmol/L 4.8  4.7  4.7   Chloride 98 - 111 mmol/L 110  109  105   CO2 22 - 32 mmol/L 23  22  20    Calcium 8.9 - 10.3 mg/dL 8.7  8.8  8.5     CT Chest Wo Contrast Result Date: 08/12/2023 CLINICAL DATA:  Chronic shortness of breath EXAM: CT CHEST WITHOUT CONTRAST TECHNIQUE: Multidetector CT imaging of the chest was performed following the standard protocol without IV contrast. RADIATION DOSE REDUCTION: This exam was performed according to the departmental dose-optimization program which includes automated exposure control, adjustment of the mA and/or kV according to patient size and/or use of iterative reconstruction technique. COMPARISON:  Chest x-ray from earlier in the same day, CT from 11/25/2021 FINDINGS: Cardiovascular: Somewhat limited due to lack of IV contrast. Atherosclerotic calcifications are identified without aneurysmal dilatation of the aorta. Coronary calcifications are seen. The heart is not significantly enlarged in size. No pericardial effusion is noted. Mediastinum/Nodes:  Thoracic inlet is within normal limits. No hilar or mediastinal adenopathy is noted. The esophagus as visualized is within normal limits. Lungs/Pleura: Diffuse emphysematous changes are noted. Scattered calcified granulomas are noted bilaterally. Stable 5 mm nodule is noted in the left upper lobe best seen on image number 33 of series 3 unchanged from 2020 and felt to be benign in etiology. Stable noncalcified right lower lobe best seen on image number 54 of series 3 dating back to 2020. No new focal nodules are seen. No focal infiltrate or sizable effusion is seen. Upper Abdomen: Visualized upper abdomen shows no acute abnormality. Musculoskeletal: Degenerative changes of the thoracic spine are noted. Prior T6 vertebral augmentation is noted. IMPRESSION: Scattered calcified and noncalcified nodules stable from multiple previous exams. No further follow-up is recommended. Previously seen ground-glass opacity in the right middle lobe has resolved. No acute abnormality is noted. Aortic Atherosclerosis (ICD10-I70.0) and Emphysema (ICD10-J43.9). Electronically Signed   By: Alcide Clever M.D.   On: 08/12/2023 20:35   DG Chest Portable 1 View Result Date: 08/12/2023 CLINICAL DATA:  Shortness of breath EXAM: PORTABLE CHEST 1 VIEW COMPARISON:  08/03/2023, 06/22/2011 FINDINGS: The heart size and mediastinal contours are within normal limits. Aortic atherosclerosis. No focal airspace consolidation, pleural effusion, or pneumothorax. Stable chondroid lesion in the proximal left humerus, benign. IMPRESSION: No active disease. Electronically Signed   By: Duanne Guess D.O.   On: 08/12/2023 17:36    Disposition Plan & Communication  Patient status: Inpatient  Admitted From: SNF Planned disposition location: Skilled nursing facility Anticipated discharge date: TBD pending respiratory improvement   Family Communication: daughter at bedside    Author: Leeroy Bock, DO [facility] 08/13/2023, 8:17 AM    Available by Epic secure chat 7AM-7PM. If 7PM-7AM, please contact night-coverage.  [facility] contact information found on [email or web address].

## 2023-08-13 NOTE — Telephone Encounter (Signed)
 Lm for patient's daughter, [name](DPR)

## 2023-08-13 NOTE — ED Notes (Signed)
 Pt dtr Randa Evens [phone]

## 2023-08-13 NOTE — Telephone Encounter (Signed)
 Per telephone encounter from today (2/27) -    Daughter would like for the patient's Care Facility [facility]  [phone] ask for [facility] )to know of the note that was given by Dr. Larinda Buttery:         I have notified Shanda Bumps at [facility].  Nothing further needed.

## 2023-08-13 NOTE — Telephone Encounter (Signed)
 I have notified Jacqueline [name] at [facility].  Nothing further needed.

## 2023-08-13 NOTE — TOC Initial Note (Signed)
 Transition of Care [facility]) - Initial/Assessment Note    Patient Details  Name: Jacqueline [name] MRN: [id number] Date of Birth: July 06, 1932  [facility]) CM/SW Contact:    Margarito Liner, LCSW Phone Number: 08/13/2023, 2:54 PM  Clinical Narrative:  Per chart review, patient is a long-term resident at [facility]. Admissions coordinator confirmed. CSW will continue to follow patient for support and facilitate return to SNF once medically stable.               Expected Discharge Plan: Skilled Nursing Facility Barriers to Discharge: Continued Medical Work up   Patient Goals and CMS Choice            Expected Discharge Plan and Services       Living arrangements for the past 2 months: Skilled Nursing Facility                                      Prior Living Arrangements/Services Living arrangements for the past 2 months: Skilled Nursing Facility Lives with:: Facility Resident Patient language and need for interpreter reviewed:: Yes        Need for Family Participation in Patient Care: Yes (Comment) Care giver support system in place?: Yes (comment)   Criminal Activity/Legal Involvement Pertinent to Current Situation/Hospitalization: No - Comment as needed  Activities of Daily Living      Permission Sought/Granted                  Emotional Assessment Appearance:: Appears stated age     Orientation: : Oriented to Self, Oriented to Place Alcohol / Substance Use: Not Applicable Psych Involvement: No (comment)  Admission diagnosis:  Acute on chronic respiratory failure with hypoxia and hypercapnia (HCC) [H84.69, J96.22] Patient Active Problem List   Diagnosis Date Noted   Acute on chronic respiratory failure with hypoxia and hypercapnia (HCC) 08/12/2023   Acute on chronic HFrEF (heart failure with reduced ejection fraction) (HCC) 08/12/2023   Frailty 08/12/2023   Acute on chronic respiratory failure with hypoxia (HCC) 07/30/2023    Peripheral cyanosis 07/30/2023   Hypotension 07/30/2023   COPD with acute exacerbation (HCC) 07/30/2023   History of cerebrovascular accident (CVA) with residual deficit 07/30/2023   Sepsis (HCC) 02/23/2023   COVID-19 02/23/2023   Upper respiratory tract infection 02/23/2023   Pressure injury of skin 02/23/2023   CAP (community acquired pneumonia) 02/22/2023   PNA (pneumonia) 02/22/2023   Hx of myocardial infarction 02/13/2023   Hepatic cirrhosis, unspecified hepatic cirrhosis type, unspecified whether ascites present (HCC) 07/13/2022   Recurrent major depressive disorder, in partial remission (HCC) 07/13/2022   Atherosclerosis of aorta (HCC) 07/13/2022   Hyperlipidemia 04/28/2022   Dilated idiopathic cardiomyopathy (HCC) 02/26/2022   Dependence on supplemental oxygen 02/26/2022   Chronic atrial fibrillation (HCC) 11/27/2021   Non-ST elevation myocardial infarction (NSTEMI), subendocardial infarction, subsequent episode of care (HCC) 11/26/2021   Acute renal failure superimposed on stage 3a chronic kidney disease (HCC) 11/26/2021   Depression 11/26/2021   Slurred speech 11/26/2021   Weakness 11/22/2021   CKD stage 3 due to type 2 diabetes mellitus (HCC) 11/21/2021   Elevated troponin 11/18/2021   Chest pain 01/05/2019   Thrombocytopenia (HCC) 07/01/2018   Chronic respiratory failure with hypoxia (HCC) 07/01/2018   Proteinuria 07/01/2018   Age-related osteoporosis without current pathological fracture 08/12/2017   S/P kyphoplasty 07/16/2017   MI, acute, non ST segment elevation (  HCC) 06/01/2017   Acute upper back pain 03/05/2017   Carcinoma of unknown primary (HCC) 03/05/2017   Liver lesion 02/27/2017   Hypertension 02/24/2017   GERD (gastroesophageal reflux disease) 02/24/2017   Hypothyroidism 02/24/2017   Hypoglycemia 02/24/2017   Bilateral leg edema 01/12/2017   AMD (age related macular degeneration) 08/20/2015   Type 2 diabetes mellitus without complications (HCC)  08/18/2014   PCP:  Earnestine Mealing, MD Pharmacy:   [facility] DELIVERY - [address] [place] - [address] [place] [address] Phone: [phone] Fax: [phone]  [facility] - [place], [place] - [address] [place] [address] Phone: [phone] Fax: [phone]  [facility] - [place], [place] - [address] [place] [address] Phone: [phone] Fax: [phone]     Social Drivers of Health (SDOH) Social History: SDOH Screenings   Food Insecurity: No Food Insecurity (08/01/2023)  Housing: Low Risk  (08/01/2023)  Transportation Needs: No Transportation Needs (08/01/2023)  Utilities: Not At Risk (08/01/2023)  Depression (PHQ2-9): Low Risk  (12/18/2021)  Social Connections: Moderately Isolated (08/01/2023)  Tobacco Use: Medium Risk (08/12/2023)   SDOH Interventions:     Readmission Risk Interventions    08/13/2023    2:54 PM  Readmission Risk Prevention Plan  Transportation Screening Complete  PCP or Specialist Appt within 3-5 Days Complete  Social Work Consult for Recovery Care Planning/Counseling Complete  Palliative Care Screening Complete  Medication Review Oceanographer) Complete

## 2023-08-14 DIAGNOSIS — J9622 Acute and chronic respiratory failure with hypercapnia: Secondary | ICD-10-CM | POA: Diagnosis not present

## 2023-08-14 DIAGNOSIS — J9621 Acute and chronic respiratory failure with hypoxia: Secondary | ICD-10-CM | POA: Diagnosis not present

## 2023-08-14 LAB — RESPIRATORY PANEL BY PCR

## 2023-08-14 LAB — BASIC METABOLIC PANEL
Anion gap: 13 (ref 5–15)
BUN: 61 mg/dL — ABNORMAL HIGH (ref 8–23)
CO2: 20 mmol/L — ABNORMAL LOW (ref 22–32)
Calcium: 8.7 mg/dL — ABNORMAL LOW (ref 8.9–10.3)
Chloride: 104 mmol/L (ref 98–111)
Creatinine, Ser: 1.77 mg/dL — ABNORMAL HIGH (ref 0.44–1.00)
GFR, Estimated: 27 mL/min — ABNORMAL LOW (ref >60)
Glucose, Bld: 161 mg/dL — ABNORMAL HIGH (ref 70–99)
Potassium: 4.8 mmol/L (ref 3.5–5.1)
Sodium: 137 mmol/L (ref 135–145)

## 2023-08-14 MED ORDER — BUDESONIDE 0.5 MG/2ML IN SUSP
0.5000 mg | Freq: Two times a day (BID) | RESPIRATORY_TRACT | Status: DC
  Administered 2023-08-14 – 2023-08-16 (×4): 0.5 mg via RESPIRATORY_TRACT
  Filled 2023-08-14 (×4): qty 2

## 2023-08-14 MED ORDER — IPRATROPIUM-ALBUTEROL 0.5-2.5 (3) MG/3ML IN SOLN
3.0000 mL | Freq: Four times a day (QID) | RESPIRATORY_TRACT | Status: DC
Start: 2023-08-14 — End: 2023-08-15
  Administered 2023-08-14 – 2023-08-15 (×4): 3 mL via RESPIRATORY_TRACT
  Filled 2023-08-14 (×4): qty 3

## 2023-08-14 MED ORDER — METHYLPREDNISOLONE SODIUM SUCC 40 MG IJ SOLR
40.0000 mg | Freq: Two times a day (BID) | INTRAMUSCULAR | Status: DC
  Administered 2023-08-14 – 2023-08-16 (×4): 40 mg via INTRAVENOUS
  Filled 2023-08-14 (×4): qty 1

## 2023-08-14 NOTE — Progress Notes (Signed)
 PT Cancellation Note  Patient Details Name: Jacqueline [name] MRN: [id number] DOB: 20-Jul-1932   Cancelled Treatment:    Reason Eval/Treat Not Completed: Other (comment). Orders received and chart reviewed. Pt's daughter asking PT to hold at this time as pt's daughter reports being told her mother is at end of life. Palliative consult pending and daughter agreeable to hold or discharge pending on GOC. PT to monitor EMR and intervene as appropriate per POC and pt wishes.    Delphia Grates. Fairly IV, PT, DPT Physical Therapist-   [facility]  08/14/2023, 1:58 PM

## 2023-08-14 NOTE — Evaluation (Signed)
 Occupational Therapy Evaluation Patient Details Name: Jacqueline [name] MRN: [id number] DOB: September 06, 1932 Today's Date: 08/14/2023   History of Present Illness   Pt is an 89 y.o. female presents from [facility] with c/o chest pain and SOB. Admitted for management of acute on chronic respiratory failure with hypoxia and hypercapnia, COPD exacerbation, acute on chronic heart failure. PMH includes NSTEMI 11/2021, T2DM, paroxysmal a-fib on Eliquis, CVA with residual L hemiparesis 11/2021, HTN, hypothyroidism, anemia, COPD on home O2 at 4 L, HFrEF with improved EF secondary to NICM, last EF 50% in September 2024 (previously 20 to 25% 2023).     Clinical Impressions Pt was seen for OT evaluation this date. PTA, pt was a LTC resident at [facility] where she has assist for all ADLs and is Min/CGA for SPT at baseline using RW.  Pt presents to acute OT demonstrating impaired ADL performance and functional mobility 2/2 weakness and low activity tolerance. Pt currently requires Mod/Max A for bed mobility, Min/Mod A for rolling to bil sides in bed for linen change, peri-care and replacing purewick. STS from EOB x2 to RW with Mod/Max A, increased time and cueing for hand/feet placement to bring feet back. L toe soreness noted. Pt on 45L at 65% on entry with respiratory present and giving the go ahead for therapy. Pt able to progress to taking a few small lateral steps towards HOB with RW and Min A for RW management before needing to return to supine. Lowest sp02 reading of 78% briefly, but mostly maintaining between 84-90% throughout with improvement with rest and PLB.  HR up to 119. Pt would benefit from skilled OT services to address noted impairments and functional limitations (see below for any additional details) in order to maximize safety and independence while minimizing falls risk and caregiver burden. Do anticipate the need for follow up OT services upon acute hospital DC.      If plan is discharge  home, recommend the following:   A lot of help with bathing/dressing/bathroom;A lot of help with walking and/or transfers     Functional Status Assessment   Patient has had a recent decline in their functional status and demonstrates the ability to make significant improvements in function in a reasonable and predictable amount of time.     Equipment Recommendations   None recommended by OT     Recommendations for Other Services         Precautions/Restrictions   Precautions Precautions: Fall Restrictions Weight Bearing Restrictions Per Provider Order: No     Mobility Bed Mobility Overal bed mobility: Needs Assistance Bed Mobility: Supine to Sit, Sit to Supine Rolling: Min assist, Mod assist, Used rails   Supine to sit: Mod assist, HOB elevated, Used rails, Max assist Sit to supine: Max assist   General bed mobility comments: Max A for bed mobility, cueing and pt assisting some with BLE movement to reach EOB; Max A to return to supine; rolls in bed with Min A to L side, Mod A to R side    Transfers Overall transfer level: Needs assistance Equipment used: Rolling walker (2 wheels) Transfers: Sit to/from Stand Sit to Stand: Mod assist, From elevated surface           General transfer comment: Mod A from elevated bed height to stand at RW with increased time and cueing for hand/feet placement-bringing feet back under her once in standing; heavy UE reliance on RW once standing, 2 trials performed and able to take some small  lateral steps with assist for RW management      Balance Overall balance assessment: Needs assistance Sitting-balance support: Bilateral upper extremity supported Sitting balance-Leahy Scale: Good Sitting balance - Comments: No LOB noted, SBA provided   Standing balance support: Bilateral upper extremity supported, Reliant on assistive device for balance Standing balance-Leahy Scale: Fair Standing balance comment: Mod A for STS and Min  A for lateral steps using RW with heavy UE reliance                           ADL either performed or assessed with clinical judgement   ADL Overall ADL's : Needs assistance/impaired;At baseline                             Toileting- Architect and Hygiene: Total assistance;Bed level               Vision         Perception         Praxis         Pertinent Vitals/Pain Pain Assessment Pain Assessment: No/denies pain     Extremity/Trunk Assessment Upper Extremity Assessment Upper Extremity Assessment: Generalized weakness   Lower Extremity Assessment Lower Extremity Assessment: Generalized weakness       Communication Communication Communication: No apparent difficulties Factors Affecting Communication: Hearing impaired   Cognition Arousal: Alert Behavior During Therapy: WFL for tasks assessed/performed Cognition: Difficult to assess Difficult to assess due to: Hard of hearing/deaf                             Following commands: Impaired Following commands impaired: Follows one step commands with increased time     Cueing  General Comments   Cueing Techniques: Verbal cues      Exercises Other Exercises Other Exercises: Edu on role of OT in acute setting and importance of therapy.   Shoulder Instructions      Home Living Family/patient expects to be discharged to:: Skilled nursing facility                                 Additional Comments: Pt lives at [facility] and want to return there.      Prior Functioning/Environment Prior Level of Function : Needs assist             Mobility Comments: As per daughter, At [facility]  Pt needs Min assit for supine ot sit and sit to supine and for STS. Transfer to Toilet with CGA using FWW. ADLs Comments: Staff and duaghter assists with ADLs, meals, and meds.    OT Problem List: Decreased strength;Impaired balance (sitting and/or  standing);Decreased activity tolerance   OT Treatment/Interventions: Self-care/ADL training;Therapeutic exercise;Therapeutic activities;Patient/family education;Balance training;Energy conservation      OT Goals(Current goals can be found in the care plan section)   Acute Rehab OT Goals Patient Stated Goal: return to [facility] OT Goal Formulation: With patient/family Time For Goal Achievement: 09/02/2023 Potential to Achieve Goals: Good ADL Goals Pt Will Transfer to Toilet: bedside commode;stand pivot transfer;with min assist;with contact guard assist Additional ADL Goal #1: Pt will demo implementation of ECS during ADL performance and functional transfers 3/3 trials to prevent overexertion.   OT Frequency:  Min 1X/week    Co-evaluation  AM-PAC OT "6 Clicks" Daily Activity     Outcome Measure Help from another person eating meals?: A Little Help from another person taking care of personal grooming?: A Little Help from another person toileting, which includes using toliet, bedpan, or urinal?: A Lot Help from another person bathing (including washing, rinsing, drying)?: A Lot Help from another person to put on and taking off regular upper body clothing?: A Little Help from another person to put on and taking off regular lower body clothing?: A Lot 6 Click Score: 15   End of Session Equipment Utilized During Treatment: Rolling walker (2 wheels);Oxygen ([facility]) Nurse Communication: Mobility status  Activity Tolerance: Patient tolerated treatment well Patient left: with call bell/phone within reach;in bed;with bed alarm set;with family/visitor present  OT Visit Diagnosis: Other abnormalities of gait and mobility (R26.89);Muscle weakness (generalized) (M62.81)                Time: 6578-4696 OT Time Calculation (min): 46 min Charges:  OT General Charges $OT Visit: 1 Visit OT Evaluation $OT Eval Moderate Complexity: 1 Mod OT Treatments $Self Care/Home Management  : 8-22 mins $Therapeutic Activity: 8-22 mins Kross Swallows, OTR/L 08/14/23, 1:01 PM  Constance Goltz 08/14/2023, 12:57 PM

## 2023-08-14 NOTE — Progress Notes (Signed)
 PROGRESS NOTE  Jacqueline [name]    DOB: [birth year], 89 y.o.  [name]    Code Status: Limited: Do not attempt resuscitation (DNR) -DNR-LIMITED -Do Not Intubate/DNI    DOA: 08/12/2023   LOS: 2   Brief hospital course  Jacqueline [name] is a 89 y.o. female with medical history significant for NSTEMI 11/2021, T2DM, paroxysmal atrial fibrillation on Eliquis, CVA 11/2021, HTN, hypothyroidism, anemia, COPD on home O2 at 4 L, HFrEF with improved EF secondary to NICM, (EF 40-45% 07/31/23, down from 50% 02/2023 but previously 20-25% 2023), and recently hospitalized from 2/13 to 08/03/2023 with respiratory failure attributed to acute on chronic HFrEF, improved with high-dose Lasix. They are presenting with worsening respiratory failure, requiring 10 L high flow nasal cannula in the ED, now being attributed to COPD.  ED course: HR 70s to 80s on arrival, respirations in the teens to low 20s, O2 sat 87% on 4 L subsequently requiring up to 15 L HFNC to maintain sats in the high end 90s subsequently down titrated to 10 L HFNC by admission with sats in the mid 90s.normal CBC Troponin of 37 (was in the 50s last admission) BNP 1847 (up from 1263 a couple weeks prior) Creatinine 1.77 up from 1.55 UA with large leukocytes EKG: a flutter with variable AV block at a rate of 78 with T wave inversion in lateral leads CT chest with contrast showing no acute abnormality and otherwise stable findings from prior exams  Patient treated for COPD with DuoNebs and Solu-Medrol with some improvement.  2/27-2/28/25 -remains on high level of oxygen. Appears comfortable. Lung example remarkable for shallow breaths. Rate controlled on monitor. Cardiology following  Assessment & Plan  Principal Problem:   Acute on chronic respiratory failure with hypoxia and hypercapnia (HCC) Active Problems:   COPD with acute exacerbation (HCC)   Chest pain   Elevated troponin   Dilated idiopathic cardiomyopathy (HCC)   Acute on chronic HFrEF  (heart failure with reduced ejection fraction) (HCC)   Acute renal failure superimposed on stage 3a chronic kidney disease (HCC)   Chronic atrial fibrillation (HCC)   Frailty   History of cerebrovascular accident (CVA) with residual deficit   Hypothyroidism   Depression   Age-related osteoporosis without current pathological fracture   Acute on chronic respiratory failure with hypoxia and hypercapnia (HCC) Suspect related to combination of COPD and CHF, superimposed on underlying baseline frailty, physical deconditioning and advanced age CTA chest was nonacute, resp panel negative.  Patient on HFNC at 10 L yesterday but had to increase to 45L 60% HHFNC today due to continued hypoxia at rest.  Had recent adjustment in home inhalers on 2/24 and also had doubling of Lasix dose on the same date without improvement - continue breathing treatments, increase frequency - ICS scheduled - continue lasix with volume status monitoring - wean oxygen to 4L as tolerated - IV steroids - Antitussives  - flutter valve - RT consulted   Acute on chronic HFrEF Nonischemic cardiomyopathy, EF 40 to 45% 07/31/2023 EF 40-45% 07/31/23, down from 50% 02/2023 but previously 20-25% 2023, BNP remains elevated at 1847, up from 1263 at discharge a couple days prior, however CTA chest not showing pulmonary congestion. Output has not been recorded - cardiology following  - continue lasix   Chest pain  Elevated troponin.  History of NSTEMI 11/2021 Continue atorvastatin Cardiology believes most consistent with demand/mismatch   CKD 3a- Cr appears to be at baseline today   Chronic afib- Was treated with  a dose of metoprolol at her facility for heart rate in the 200s - Continue apixaban - no rate control medications at this time and is rate controlled   Frailty Patient with advanced age, debility frequent admissions multiple chronic medical conditions Mortality in 6 months will not be unexpected Palliative care  consult might be appropriate - PT/OT once breathing has improved   History of cerebrovascular accident (CVA) with residual deficit Has residual left hemiparesis and tremors PT consult Continue apixaban   Hypothyroidism Continue levothyroxine Last TSH a month ago was 5.8   Depression Continue sertraline   Age-related osteoporosis without current pathological fracture No concerning issues this admission  Body mass index is 24.71 kg/m.  VTE ppx: apixaban (ELIQUIS) tablet 2.5 mg Start: 08/12/23 2230 apixaban (ELIQUIS) tablet 2.5 mg   Diet:     Diet   Diet heart healthy/carb modified Room service appropriate? Yes; Fluid consistency: Thin   Consultants: Cardiology   Subjective 08/14/23    When asked how she is doing today, patient waves her hand to say "so-so". She is able to nod and shake head in response to questions but is not conversant. Denies pain or SOB.    Objective   Vitals:   08/13/23 2033 08/14/23 0400 08/14/23 0500 08/14/23 0725  BP:      Pulse:    70  Resp:    14  Temp:  97.9 F (36.6 C)    TempSrc:  Oral    SpO2: 94%   91%  Weight:   51.8 kg   Height:        Intake/Output Summary (Last 24 hours) at 08/14/2023 0744 Last data filed at 08/13/2023 1400 Gross per 24 hour  Intake 120 ml  Output --  Net 120 ml   Filed Weights   08/12/23 1653 08/14/23 0500  Weight: 49.9 kg 51.8 kg    Physical Exam:  General: awake, alert, NAD. Tired-appearing  HEENT: hard of hearing. Respiratory: normal respiratory effort. Shallow breaths, no rales or wheezing Cardiovascular: quick capillary refill, normal S1/S2, RRR, no JVD, murmurs Gastrointestinal: soft, NT, ND Nervous: Alert and able to follow commands. Left sided tremor and weakness which daughter states has been her baseline for months Extremities: no edema, normal tone Skin: dry, intact, normal temperature, normal color. No rashes, lesions or ulcers on exposed skin Psychiatry: flat mood, congruent  affect  Labs   I have personally reviewed the following labs and imaging studies CBC    Component Value Date/Time   WBC 4.3 08/13/2023 0400   RBC 5.29 (H) 08/13/2023 0400   HGB 13.1 08/13/2023 0400   HGB 13.4 06/15/2014 0346   HCT 43.5 08/13/2023 0400   HCT 40.1 06/15/2014 0346   PLT 165 08/13/2023 0400   PLT 206 06/15/2014 0346   MCV 82.2 08/13/2023 0400   MCV 94 06/15/2014 0346   MCH 24.8 (L) 08/13/2023 0400   MCHC 30.1 08/13/2023 0400   RDW 21.6 (H) 08/13/2023 0400   RDW 13.3 06/15/2014 0346   LYMPHSABS 1.6 08/03/2023 1506   LYMPHSABS 1.4 06/15/2014 0346   MONOABS 0.5 08/03/2023 1506   MONOABS 0.6 06/15/2014 0346   EOSABS 0.0 08/03/2023 1506   EOSABS 0.2 06/15/2014 0346   BASOSABS 0.0 08/03/2023 1506   BASOSABS 0.0 06/15/2014 0346      Latest Ref Rng & Units 08/14/2023    4:58 AM 08/13/2023    4:00 AM 08/12/2023    4:04 PM  BMP  Glucose 70 - 99 mg/dL 401  169  103   BUN 8 - 23 mg/dL 61  59  60   Creatinine 0.44 - 1.00 mg/dL 1.61  0.96  0.45   Sodium 135 - 145 mmol/L 137  141  139   Potassium 3.5 - 5.1 mmol/L 4.8  4.8  4.7   Chloride 98 - 111 mmol/L 104  110  109   CO2 22 - 32 mmol/L 20  23  22    Calcium 8.9 - 10.3 mg/dL 8.7  8.7  8.8     CT Chest Wo Contrast Result Date: 08/12/2023 CLINICAL DATA:  Chronic shortness of breath EXAM: CT CHEST WITHOUT CONTRAST TECHNIQUE: Multidetector CT imaging of the chest was performed following the standard protocol without IV contrast. RADIATION DOSE REDUCTION: This exam was performed according to the departmental dose-optimization program which includes automated exposure control, adjustment of the mA and/or kV according to patient size and/or use of iterative reconstruction technique. COMPARISON:  Chest x-ray from earlier in the same day, CT from 11/25/2021 FINDINGS: Cardiovascular: Somewhat limited due to lack of IV contrast. Atherosclerotic calcifications are identified without aneurysmal dilatation of the aorta. Coronary  calcifications are seen. The heart is not significantly enlarged in size. No pericardial effusion is noted. Mediastinum/Nodes: Thoracic inlet is within normal limits. No hilar or mediastinal adenopathy is noted. The esophagus as visualized is within normal limits. Lungs/Pleura: Diffuse emphysematous changes are noted. Scattered calcified granulomas are noted bilaterally. Stable 5 mm nodule is noted in the left upper lobe best seen on image number 33 of series 3 unchanged from 2020 and felt to be benign in etiology. Stable noncalcified right lower lobe best seen on image number 54 of series 3 dating back to 2020. No new focal nodules are seen. No focal infiltrate or sizable effusion is seen. Upper Abdomen: Visualized upper abdomen shows no acute abnormality. Musculoskeletal: Degenerative changes of the thoracic spine are noted. Prior T6 vertebral augmentation is noted. IMPRESSION: Scattered calcified and noncalcified nodules stable from multiple previous exams. No further follow-up is recommended. Previously seen ground-glass opacity in the right middle lobe has resolved. No acute abnormality is noted. Aortic Atherosclerosis (ICD10-I70.0) and Emphysema (ICD10-J43.9). Electronically Signed   By: Alcide Clever M.D.   On: 08/12/2023 20:35   DG Chest Portable 1 View Result Date: 08/12/2023 CLINICAL DATA:  Shortness of breath EXAM: PORTABLE CHEST 1 VIEW COMPARISON:  08/03/2023, 06/22/2011 FINDINGS: The heart size and mediastinal contours are within normal limits. Aortic atherosclerosis. No focal airspace consolidation, pleural effusion, or pneumothorax. Stable chondroid lesion in the proximal left humerus, benign. IMPRESSION: No active disease. Electronically Signed   By: Duanne Guess D.O.   On: 08/12/2023 17:36    Disposition Plan & Communication  Patient status: Inpatient  Admitted From: SNF Planned disposition location: Skilled nursing facility Anticipated discharge date: TBD pending respiratory  improvement   Family Communication: daughter at bedside    Author: Leeroy Bock, DO [facility] 08/14/2023, 7:44 AM   Available by Epic secure chat 7AM-7PM. If 7PM-7AM, please contact night-coverage.  [facility] contact information found on [email or web address].

## 2023-08-14 NOTE — Progress Notes (Signed)
 [facility] CLINIC CARDIOLOGY PROGRESS NOTE       Patient ID: Jacqueline [name] MRN: [id number] DOB/AGE: [birth year] 89 y.o.  Admit date: 08/12/2023 Referring Physician Dr. Lindajo Royal Primary Physician Earnestine Mealing, MD  Primary Cardiologist Dr. Darrold Junker Reason for Consultation AoCHF  HPI: Jacqueline [name] is a 89 y.o. female  with a past medical history of  NICM / HFrEF (LVEF 40-45%, G1 DD), chronic respiratory failure on 4 L at baseline, accessible atrial fibrillation, hypertension, hyperlipidemia, CKD 3, type 2 diabetes, hypothyroidism who presented to the ED on 08/12/2023 for SOB. Cardiology was consulted for further evaluation.   History: -Patient seen and examined this morning, resting comfortably in hospital bed. -She denies any significant shortness of breath, laying nearly flat in bed.  Remains on supplemental O2, this was increased to 14 L. -She denies any chest pain symptoms. -No documented urine output.  Review of systems complete and found to be negative unless listed above    Past Medical History:  Diagnosis Date   Acute exacerbation of CHF (congestive heart failure) (HCC) 11/18/2021   AKI (acute kidney injury) (HCC) 11/26/2021   Anemia    CHF (congestive heart failure) (HCC)    Diabetes mellitus without complication (HCC) 03/13/2017   Currently no high BS.  Has come off meds.  But having episodes of low BS now.   Dilated idiopathic cardiomyopathy (HCC)    Dizziness 02/18/2017   GERD (gastroesophageal reflux disease)    Hyperkalemia 11/26/2021   Hyperlipidemia    Hypertension    Hyponatremia 06/09/2017   Hypothyroidism    Osteoporosis    Renal disorder    stage 3    Past Surgical History:  Procedure Laterality Date   BUNIONECTOMY     COLONOSCOPY     ESOPHAGOGASTRODUODENOSCOPY (EGD) WITH PROPOFOL N/A 10/15/2017   Procedure: ESOPHAGOGASTRODUODENOSCOPY (EGD) WITH PROPOFOL;  Surgeon: Christena Deem, MD;  Location: [facility] ENDOSCOPY;  Service: Endoscopy;   Laterality: N/A;   KYPHOPLASTY N/A 06/12/2017   Procedure: [id number];  Surgeon: Kennedy Bucker, MD;  Location: [facility] ORS;  Service: Orthopedics;  Laterality: N/A;   TONSILLECTOMY      Medications Prior to Admission  Medication Sig Dispense Refill Last Dose/Taking   acetaminophen (TYLENOL) 325 MG tablet Take 2 tablets (650 mg total) by mouth every 4 (four) hours as needed for headache or mild pain.   Taking As Needed   alendronate (FOSAMAX) 70 MG tablet Take 70 mg by mouth once a week. Take with a full glass of water on an empty stomach.   08/08/2023   apixaban (ELIQUIS) 2.5 MG TABS tablet Take 1 tablet (2.5 mg total) by mouth 2 (two) times daily. Start on 11/30/2021 60 tablet  08/12/2023 at  9:00 AM   ARTIFICIAL TEAR SOLUTION OP Apply 1 drop to eye 2 (two) times daily. Both Eyes.   08/12/2023 at  9:00 AM   atorvastatin (LIPITOR) 80 MG tablet Take 1 tablet (80 mg total) by mouth daily.   08/12/2023 at  9:00 AM   budesonide (PULMICORT) 0.25 MG/2ML nebulizer solution Take 2 mLs (0.25 mg total) by nebulization in the morning and at bedtime. 60 mL 12 08/12/2023 at  9:00 AM   Cholecalciferol (D3-1000) 25 MCG (1000 UT) capsule Take 1,000 Units by mouth daily.   08/12/2023 at  9:00 AM   Dextromethorphan-guaiFENesin 20-400 MG TABS Take 1 tablet by mouth 2 (two) times daily.   08/12/2023 at  9:00 AM   diclofenac Sodium (VOLTAREN) 1 % GEL Apply 2 g  topically daily as needed (pain). Apply to back topically every day shift for pain.   08/12/2023 at  9:00 AM   ferrous sulfate 325 (65 FE) MG EC tablet Take 325 mg by mouth. Every Monday, Wednesday and Friday.   08/12/2023 at  9:00 AM   furosemide (LASIX) 40 MG tablet Take 40 mg by mouth daily as needed (for 3lbs weight gain overnight or 5lbs in/week).   Taking As Needed   hydrocortisone (ANUSOL-HC) 25 MG suppository Place 25 mg rectally 2 (two) times daily as needed for hemorrhoids or anal itching.   Taking As Needed   levalbuterol (XOPENEX) 1.25 MG/0.5ML nebulizer  solution Take 1.25 mg by nebulization every 4 (four) hours as needed for wheezing or shortness of breath. 1 each 2 Taking As Needed   levothyroxine (SYNTHROID) 50 MCG tablet Take 50 mcg by mouth at bedtime.   08/11/2023   Nutritional Supplements (,FEEDING SUPPLEMENT, PROSOURCE PLUS) liquid Take 30 mLs by mouth daily.   Taking   Nutritional Supplements (ENSURE ENLIVE PO) Take 1 Can by mouth as directed. After meals if she eats less than 50 % due to poor appetite and weight loss s/p covid   Taking   OXYGEN 4lpm via [place] for SOB continuous.   Taking   pantoprazole (PROTONIX) 40 MG tablet Take 40 mg by mouth in the morning and at bedtime.   08/12/2023 at  9:00 AM   polyethylene glycol (MIRALAX / GLYCOLAX) 17 g packet Take 17 g by mouth every other day.   Taking   potassium chloride (KLOR-CON) 20 MEQ packet Take 20 mEq by mouth daily.   08/12/2023 at  9:00 AM   senna-docusate (SENOKOT-S) 8.6-50 MG tablet Take 2 tablets by mouth daily.   08/12/2023 at  9:00 AM   sertraline (ZOLOFT) 50 MG tablet Take 50 mg by mouth daily.   08/12/2023 at  9:00 AM   torsemide (DEMADEX) 20 MG tablet Take torsemide 20 mg every Monday, Wednesday, Friday and Sunday. Take torsemide 40 mg every Tuesday, Thursday, Saturday   08/12/2023 at  9:00 AM   arformoterol (BROVANA) 15 MCG/2ML NEBU Take 2 mLs (15 mcg total) by nebulization in the morning and at bedtime. (Patient not taking: Reported on 08/13/2023) 120 mL 6 Not Taking   ipratropium (ATROVENT) 0.02 % nebulizer solution Take 2.5 mLs (0.5 mg total) by nebulization 4 (four) times daily. (Patient not taking: Reported on 08/13/2023) 75 mL 12 Not Taking   revefenacin (YUPELRI) 175 MCG/3ML nebulizer solution Take 3 mLs (175 mcg total) by nebulization daily. (Patient not taking: Reported on 08/13/2023) 90 mL 6 Not Taking   Social History   Socioeconomic History   Marital status: Widowed    Spouse name: Not on file   Number of children: Not on file   Years of education: Not on file    Highest education level: Not on file  Occupational History   Not on file  Tobacco Use   Smoking status: Former    Current packs/day: 0.00    Types: Cigarettes    Quit date: 04/18/1984    Years since quitting: 39.3   Smokeless tobacco: Never  Vaping Use   Vaping status: Former   Start date: 05/16/1965   Quit date: 05/16/1989  Substance and Sexual Activity   Alcohol use: No   Drug use: No   Sexual activity: Never  Other Topics Concern   Not on file  Social History Narrative   Not on file   Social Drivers of Health  Financial Resource Strain: Not on file  Food Insecurity: No Food Insecurity (08/13/2023)   Hunger Vital Sign    Worried About Running Out of Food in the Last Year: Never true    Ran Out of Food in the Last Year: Never true  Transportation Needs: No Transportation Needs (08/13/2023)   PRAPARE - Administrator, Civil Service (Medical): No    Lack of Transportation (Non-Medical): No  Physical Activity: Not on file  Stress: Not on file  Social Connections: Moderately Isolated (08/13/2023)   Social Connection and Isolation Panel [NHANES]    Frequency of Communication with Friends and Family: More than three times a week    Frequency of Social Gatherings with Friends and Family: Once a week    Attends Religious Services: 1 to 4 times per year    Active Member of [employer] or Organizations: No    Attends Banker Meetings: Never    Marital Status: Widowed  Intimate Partner Violence: Not At Risk (08/13/2023)   Humiliation, Afraid, Rape, and Kick questionnaire    Fear of Current or Ex-Partner: No    Emotionally Abused: No    Physically Abused: No    Sexually Abused: No    Family History  Problem Relation Age of Onset   Premature CHD Brother      Vitals:   08/13/23 2033 08/14/23 0400 08/14/23 0500 08/14/23 0725  BP:      Pulse:    70  Resp:    14  Temp:  97.9 F (36.6 C)    TempSrc:  Oral    SpO2: 94%   91%  Weight:   51.8 kg   Height:         PHYSICAL EXAM General: Chronically ill appearing female, well nourished, in no acute distress. HEENT: Normocephalic and atraumatic. Neck: No JVD.  Lungs: Normal respiratory effort on 14 L high flow nasal cannula. Clear bilaterally to auscultation. No wheezes, crackles, rhonchi.  Heart: HRRR. Normal S1 and S2 without gallops or murmurs.  Abdomen: Non-distended appearing.  Msk: Normal strength and tone for age. Extremities: Warm and well perfused. No clubbing, cyanosis. 2+ pitting edema.  Neuro: Alert and oriented X 3. Psych: Answers questions appropriately.   Labs: Basic Metabolic Panel: Recent Labs    08/13/23 0400 08/14/23 0458  NA 141 137  K 4.8 4.8  CL 110 104  CO2 23 20*  GLUCOSE 169* 161*  BUN 59* 61*  CREATININE 1.60* 1.77*  CALCIUM 8.7* 8.7*   Liver Function Tests: Recent Labs    08/13/23 0400  AST 31  ALT 16  ALKPHOS 64  BILITOT 1.1  PROT 6.9  ALBUMIN 3.5   No results for input(s): "LIPASE", "AMYLASE" in the last 72 hours. CBC: Recent Labs    08/12/23 1604 08/13/23 0400  WBC 7.6 4.3  HGB 13.4 13.1  HCT 44.8 43.5  MCV 82.5 82.2  PLT 178 165   Cardiac Enzymes: Recent Labs    08/12/23 1604 08/12/23 1836  TROPONINIHS 36* 37*   BNP: Recent Labs    08/12/23 1604  BNP 1,847.1*   D-Dimer: No results for input(s): "DDIMER" in the last 72 hours. Hemoglobin A1C: No results for input(s): "HGBA1C" in the last 72 hours. Fasting Lipid Panel: No results for input(s): "CHOL", "HDL", "LDLCALC", "TRIG", "CHOLHDL", "LDLDIRECT" in the last 72 hours. Thyroid Function Tests: No results for input(s): "TSH", "T4TOTAL", "T3FREE", "THYROIDAB" in the last 72 hours.  Invalid input(s): "FREET3" Anemia Panel: No results for  input(s): "VITAMINB12", "FOLATE", "FERRITIN", "TIBC", "IRON", "RETICCTPCT" in the last 72 hours.   Radiology: CT Chest Wo Contrast Result Date: 08/12/2023 CLINICAL DATA:  Chronic shortness of breath EXAM: CT CHEST WITHOUT CONTRAST  TECHNIQUE: Multidetector CT imaging of the chest was performed following the standard protocol without IV contrast. RADIATION DOSE REDUCTION: This exam was performed according to the departmental dose-optimization program which includes automated exposure control, adjustment of the mA and/or kV according to patient size and/or use of iterative reconstruction technique. COMPARISON:  Chest x-ray from earlier in the same day, CT from 11/25/2021 FINDINGS: Cardiovascular: Somewhat limited due to lack of IV contrast. Atherosclerotic calcifications are identified without aneurysmal dilatation of the aorta. Coronary calcifications are seen. The heart is not significantly enlarged in size. No pericardial effusion is noted. Mediastinum/Nodes: Thoracic inlet is within normal limits. No hilar or mediastinal adenopathy is noted. The esophagus as visualized is within normal limits. Lungs/Pleura: Diffuse emphysematous changes are noted. Scattered calcified granulomas are noted bilaterally. Stable 5 mm nodule is noted in the left upper lobe best seen on image number 33 of series 3 unchanged from 2020 and felt to be benign in etiology. Stable noncalcified right lower lobe best seen on image number 54 of series 3 dating back to 2020. No new focal nodules are seen. No focal infiltrate or sizable effusion is seen. Upper Abdomen: Visualized upper abdomen shows no acute abnormality. Musculoskeletal: Degenerative changes of the thoracic spine are noted. Prior T6 vertebral augmentation is noted. IMPRESSION: Scattered calcified and noncalcified nodules stable from multiple previous exams. No further follow-up is recommended. Previously seen ground-glass opacity in the right middle lobe has resolved. No acute abnormality is noted. Aortic Atherosclerosis (ICD10-I70.0) and Emphysema (ICD10-J43.9). Electronically Signed   By: Alcide Clever M.D.   On: 08/12/2023 20:35   DG Chest Portable 1 View Result Date: 08/12/2023 CLINICAL DATA:  Shortness  of breath EXAM: PORTABLE CHEST 1 VIEW COMPARISON:  08/03/2023, 06/22/2011 FINDINGS: The heart size and mediastinal contours are within normal limits. Aortic atherosclerosis. No focal airspace consolidation, pleural effusion, or pneumothorax. Stable chondroid lesion in the proximal left humerus, benign. IMPRESSION: No active disease. Electronically Signed   By: Duanne Guess D.O.   On: 08/12/2023 17:36   DG Chest Port 1 View Result Date: 08/03/2023 CLINICAL DATA:  Shortness of breath.  History of pulmonary edema. EXAM: PORTABLE CHEST 1 VIEW COMPARISON:  Chest radiograph dated 07/30/2023. FINDINGS: No focal consolidation, pleural effusion, or pneumothorax. The cardiac silhouette is within limits. Atherosclerotic calcification of the aorta. Osteopenia with degenerative changes of the spine. No acute osseous pathology. Similar sclerotic lesion of the proximal left humerus. IMPRESSION: No active disease. Electronically Signed   By: Elgie Collard M.D.   On: 08/03/2023 17:55   ECHOCARDIOGRAM COMPLETE Result Date: 07/31/2023    ECHOCARDIOGRAM REPORT   Patient Name:   Jacqueline [name] Date of Exam: 07/31/2023 Medical Rec #:  [id number]     Height:       57.0 in Accession #:    [id number]    Weight:       114.0 lb Date of Birth:  February 05, 1933     BSA:          1.416 m Patient Age:    89 years      BP:           103/65 mmHg Patient Gender: F             HR:  86 bpm. Exam Location:  [facility] Procedure: 2D Echo, Cardiac Doppler and Color Doppler (Both Spectral and Color            Flow Doppler were utilized during procedure). Indications:     Acute Respiratory Distress  History:         Patient has prior history of Echocardiogram examinations, most                  recent 11/27/2021. CHF and Cardiomyopathy, Previous Myocardial                  Infarction, COPD and Stroke, Arrythmias:Atrial Fibrillation;                  Risk Factors:Hypertension, Diabetes and Dyslipidemia. CKD.  Sonographer:     Mikki Harbor  Referring Phys:  [id number] Andris Baumann Diagnosing Phys: Alwyn Pea MD  Sonographer Comments: Image acquisition challenging due to respiratory motion. IMPRESSIONS  1. Left ventricular ejection fraction, by estimation, is 40 to 45%. The left ventricle has mildly decreased function. The left ventricle demonstrates global hypokinesis. There is mild asymmetric left ventricular hypertrophy of the inferior and basal segments. Left ventricular diastolic parameters are consistent with Grade I diastolic dysfunction (impaired relaxation).  2. Right ventricular systolic function is moderately reduced. The right ventricular size is moderately enlarged. There is mildly elevated pulmonary artery systolic pressure.  3. The mitral valve is grossly normal. Mild mitral valve regurgitation.  4. The aortic valve is normal in structure. Aortic valve regurgitation is mild. FINDINGS  Left Ventricle: Left ventricular ejection fraction, by estimation, is 40 to 45%. The left ventricle has mildly decreased function. The left ventricle demonstrates global hypokinesis. Strain imaging was not performed. The left ventricular internal cavity  size was normal in size. There is mild asymmetric left ventricular hypertrophy of the inferior and basal segments. Left ventricular diastolic parameters are consistent with Grade I diastolic dysfunction (impaired relaxation). Right Ventricle: The right ventricular size is moderately enlarged. No increase in right ventricular wall thickness. Right ventricular systolic function is moderately reduced. There is mildly elevated pulmonary artery systolic pressure. The tricuspid regurgitant velocity is 2.71 m/s, and with an assumed right atrial pressure of 15 mmHg, the estimated right ventricular systolic pressure is 44.4 mmHg. Left Atrium: Left atrial size was normal in size. Right Atrium: Right atrial size was normal in size. Pericardium: There is no evidence of pericardial effusion. Mitral Valve: The  mitral valve is grossly normal. Mild to moderate mitral annular calcification. Mild mitral valve regurgitation. MV peak gradient, 4.9 mmHg. The mean mitral valve gradient is 2.0 mmHg. Tricuspid Valve: The tricuspid valve is normal in structure. Tricuspid valve regurgitation is mild. Aortic Valve: The aortic valve is normal in structure. Aortic valve regurgitation is mild. Aortic valve mean gradient measures 3.0 mmHg. Aortic valve peak gradient measures 5.8 mmHg. Aortic valve area, by VTI measures 1.85 cm. Pulmonic Valve: The pulmonic valve was normal in structure. Pulmonic valve regurgitation is mild to moderate. Aorta: The ascending aorta was not well visualized. IAS/Shunts: No atrial level shunt detected by color flow Doppler. Additional Comments: 3D imaging was not performed.  LEFT VENTRICLE PLAX 2D LVIDd:         4.30 cm   Diastology LVIDs:         3.30 cm   LV e' lateral:   9.36 cm/s LV PW:         1.30 cm   LV E/e' lateral: 11.3 LV IVS:  1.00 cm LVOT diam:     2.00 cm LV SV:         39 LV SV Index:   28 LVOT Area:     3.14 cm  RIGHT VENTRICLE RV Basal diam:  3.95 cm RV Mid diam:    5.20 cm LEFT ATRIUM             Index        RIGHT ATRIUM           Index LA diam:        4.10 cm 2.89 cm/m   RA Area:     20.10 cm LA Vol (A2C):   62.8 ml 44.34 ml/m  RA Volume:   61.90 ml  43.70 ml/m LA Vol (A4C):   49.0 ml 34.59 ml/m LA Biplane Vol: 56.1 ml 39.61 ml/m  AORTIC VALVE                    PULMONIC VALVE AV Area (Vmax):    1.95 cm     PV Vmax:       0.76 m/s AV Area (Vmean):   1.80 cm     PV Peak grad:  2.3 mmHg AV Area (VTI):     1.85 cm AV Vmax:           120.67 cm/s AV Vmean:          78.367 cm/s AV VTI:            0.212 m AV Peak Grad:      5.8 mmHg AV Mean Grad:      3.0 mmHg LVOT Vmax:         74.83 cm/s LVOT Vmean:        44.833 cm/s LVOT VTI:          0.125 m LVOT/AV VTI ratio: 0.59  AORTA Ao Root diam: 3.20 cm Ao Asc diam:  3.30 cm MITRAL VALVE                TRICUSPID VALVE MV Area (PHT):  3.72 cm     TR Peak grad:   29.4 mmHg MV Area VTI:   1.68 cm     TR Vmax:        271.00 cm/s MV Peak grad:  4.9 mmHg MV Mean grad:  2.0 mmHg     SHUNTS MV Vmax:       1.11 m/s     Systemic VTI:  0.12 m MV Vmean:      65.7 cm/s    Systemic Diam: 2.00 cm MV Decel Time: 204 msec MV E velocity: 106.00 cm/s Alwyn Pea MD Electronically signed by Alwyn Pea MD Signature Date/Time: 07/31/2023/10:02:24 PM    Final    DG Chest 2 View Result Date: 07/30/2023 CLINICAL DATA:  Shortness of breath EXAM: CHEST - 2 VIEW COMPARISON:  X-ray 02/22/2023 and older. FINDINGS: Normal cardiopericardial silhouette with calcified aorta. There is fullness of the hila bilaterally which could be enlarged pulmonary arteries. No pneumothorax, effusion or edema. Film is under penetrated. There is compression deformity of midthoracic spine vertebral level with some augmentation cement, unchanged. Sclerotic lesion along the left proximal humerus is again noted and could be a chondroid lesion. IMPRESSION: Enlarged central pulmonary arteries as on prior. No consolidation or effusion. Stable compression deformity of a midthoracic spine vertebral level with augmentation cement Electronically Signed   By: Karen Kays M.D.   On: 07/30/2023 17:17    ECHO 07/31/23:  1. Left ventricular ejection fraction, by estimation, is 40 to 45%. The left ventricle has mildly decreased function. The left ventricle demonstrates global hypokinesis. There is mild asymmetric left ventricular hypertrophy of the inferior and basal segments. Left ventricular diastolic parameters are consistent with Grade I diastolic dysfunction (impaired relaxation).   2. Right ventricular systolic function is moderately reduced. The right ventricular size is moderately enlarged. There is mildly elevated pulmonary artery systolic pressure.   3. The mitral valve is grossly normal. Mild mitral valve regurgitation.   4. The aortic valve is normal in structure. Aortic valve  regurgitation is mild.   TELEMETRY reviewed by me 08/14/2023: atrial flutter rate 80s  EKG reviewed by me: atrial flutter rate 78 bpm  Data reviewed by me 08/14/2023: last 24h vitals tele labs imaging I/O hospitalist progress note  Principal Problem:   Acute on chronic respiratory failure with hypoxia and hypercapnia (HCC) Active Problems:   Hypothyroidism   Chest pain   Elevated troponin   Acute renal failure superimposed on stage 3a chronic kidney disease (HCC)   Depression   Chronic atrial fibrillation (HCC)   Age-related osteoporosis without current pathological fracture   Dilated idiopathic cardiomyopathy (HCC)   COPD with acute exacerbation (HCC)   History of cerebrovascular accident (CVA) with residual deficit   Acute on chronic HFrEF (heart failure with reduced ejection fraction) (HCC)   Frailty    ASSESSMENT AND PLAN:  Jacqueline [name] is a 89 y.o. female  with a past medical history of  NICM / HFrEF (LVEF 40-45%, G1 DD), chronic respiratory failure on 4 L at baseline, accessible atrial fibrillation, hypertension, hyperlipidemia, CKD 3, type 2 diabetes, hypothyroidism who presented to the ED on 08/12/2023 for SOB. Cardiology was consulted for further evaluation.   # Acute on chronic HFrEF # Non-ischemic cardiomyopathy Patient initially sent to the ED for worsening shortness of breath and hypoxia.  Found to be in COPD exacerbation as well as acute heart failure exacerbation with elevated BNP at 1800.  Echo last admission showed EF 40-45%.  Renal function improved with diuresis, creatinine 1.6 today. -Continue IV lasix 40 mg twice daily. May consider higher dose pending how she diureses.  -Further additions to GDMT limited by BP and renal function. -Minimally elevated and flat trending troponins most consistent with demand/supply mismatch and not ACS, she denies chest pain.  # Persistent atrial fibrillation/flutter Patient maintaining in atrial fibrillation/flutter with  controlled heart rate. -Continue Eliquis 2.5 mg twice daily for stroke risk reduction (dose appropriate given age greater than 80 years and weight less than 60 kg). -Not currently on any rate control medications due to hypotension.    This patient's plan of care was discussed and created with Dr. Darrold Junker and he is in agreement.  Signed: Gale Journey, PA-C  08/14/2023, 9:06 AM [facility] Cardiology

## 2023-08-14 NOTE — Plan of Care (Signed)
  Problem: Pain Managment: Goal: General experience of comfort will improve and/or be controlled Outcome: Progressing   Problem: Safety: Goal: Ability to remain free from injury will improve Outcome: Progressing   Problem: Safety: Goal: Ability to remain free from injury will improve Outcome: Progressing   Problem: Skin Integrity: Goal: Risk for impaired skin integrity will decrease Outcome: Progressing   Problem: Education: Goal: Ability to demonstrate management of disease process will improve Outcome: Progressing

## 2023-08-14 NOTE — Progress Notes (Signed)
 Heart Failure Navigator Progress Note   Current patient of The Advanced Heart Failure Team Arvilla Meres MD & Clarisa Kindred, FNP) Patient was scheduled for post hospitalization follow-up 08/24/23 @ 11:30.  Daughter aware of scheduled appointment date and time. Follow-up details added to discharge paperwork.  [name], RN, BSN [facility] Heart Failure Navigator Secure Chat Only

## 2023-08-15 ENCOUNTER — Inpatient Hospital Stay

## 2023-08-15 DIAGNOSIS — I482 Chronic atrial fibrillation, unspecified: Secondary | ICD-10-CM

## 2023-08-15 DIAGNOSIS — J439 Emphysema, unspecified: Secondary | ICD-10-CM

## 2023-08-15 DIAGNOSIS — J9622 Acute and chronic respiratory failure with hypercapnia: Secondary | ICD-10-CM | POA: Diagnosis not present

## 2023-08-15 DIAGNOSIS — J9621 Acute and chronic respiratory failure with hypoxia: Secondary | ICD-10-CM | POA: Diagnosis not present

## 2023-08-15 DIAGNOSIS — I5043 Acute on chronic combined systolic (congestive) and diastolic (congestive) heart failure: Secondary | ICD-10-CM

## 2023-08-15 DIAGNOSIS — R627 Adult failure to thrive: Secondary | ICD-10-CM

## 2023-08-15 LAB — BASIC METABOLIC PANEL
Anion gap: 11 (ref 5–15)
BUN: 68 mg/dL — ABNORMAL HIGH (ref 8–23)
CO2: 21 mmol/L — ABNORMAL LOW (ref 22–32)
Calcium: 8.6 mg/dL — ABNORMAL LOW (ref 8.9–10.3)
Chloride: 105 mmol/L (ref 98–111)
Creatinine, Ser: 1.58 mg/dL — ABNORMAL HIGH (ref 0.44–1.00)
GFR, Estimated: 31 mL/min — ABNORMAL LOW (ref >60)
Glucose, Bld: 134 mg/dL — ABNORMAL HIGH (ref 70–99)
Potassium: 4.5 mmol/L (ref 3.5–5.1)
Sodium: 137 mmol/L (ref 135–145)

## 2023-08-15 LAB — GLUCOSE, CAPILLARY: Glucose-Capillary: 152 mg/dL — ABNORMAL HIGH (ref 70–99)

## 2023-08-15 MED ORDER — MORPHINE SULFATE (PF) 2 MG/ML IV SOLN
1.0000 mg | Freq: Once | INTRAVENOUS | Status: AC
  Administered 2023-08-15: 1 mg via INTRAVENOUS
  Filled 2023-08-15: qty 1

## 2023-08-15 MED ORDER — ACETYLCYSTEINE 20 % IN SOLN
3.0000 mL | Freq: Two times a day (BID) | RESPIRATORY_TRACT | Status: DC
  Administered 2023-08-15 (×2): 3 mL via RESPIRATORY_TRACT
  Filled 2023-08-15 (×3): qty 4

## 2023-08-15 MED ORDER — ARFORMOTEROL TARTRATE 15 MCG/2ML IN NEBU
15.0000 ug | INHALATION_SOLUTION | Freq: Two times a day (BID) | RESPIRATORY_TRACT | Status: DC
  Administered 2023-08-15 – 2023-08-16 (×2): 15 ug via RESPIRATORY_TRACT
  Filled 2023-08-15 (×3): qty 2

## 2023-08-15 NOTE — Consult Note (Signed)
 NAME:  Jacqueline [name], MRN:  [id number], DOB:  [birth year], LOS: 3 ADMISSION DATE:  08/12/2023, CONSULTATION DATE: 01/Mar/2025  REFERRING MD: Jamelle Rushing, MD  CHIEF COMPLAINT: Inability to wean down O2, acute on chronic respiratory failure with hypoxia  History of Present Illness:  Patient is an 89 year old very remote former smoker recently evaluated at [facility] (Dr. [place] Assaker) for issues with debilitating dyspnea and worsening oxygen requirement in the setting of chronic respiratory failure with hypoxia.  She has a very complex medical history as noted below.  She has been hospitalized 4 times since September 2024.  At that time she had COVID-19.  Per her daughter, her oxygen requirements have increased significantly since that COVID-19 episode.  She used to have a requirement of 2 L/min nasal cannula now that requirement at home had been 5 L/min.  She has had issues with recurrent bouts of combined systolic and diastolic heart failure, and per daughter for episodes of poorly controlled atrial fib with RVR in the weeks prior to admission.  She presented on 12 August 2023 with increasing shortness of breath and with oxygen requirement increased to 10 L/min via bubble high flow in the ED.  She was admitted with a working diagnosis of COPD with acute exacerbation.  Of note she had had recent change on her nebulizer therapy at home and DuoNeb was added replacing and Brovana.  The patient also had undergone increased dose of Lasix from her primary care physician.  The patient is on anticoagulation for her chronic atrial fibrillation.  We are being asked to evaluate the patient due to increasing O2 requirements and inability to wean off of high flow oxygen.  The patient cannot provide complete history due to the severity of her current condition.  The patient is exceedingly debilitated.  She cannot state that she is short of breath and that she feels uncomfortable.  Per the  patient's daughter she has not had any fevers or chills.  I conferred with the patient's respiratory therapist assigned to her today, and developed a plan moving forward.  Per RRT the patient is having purulent sputum production that is very tenacious.  There is also evidence that she may be intermittently aspirating.  I also, conferred with the patient's daughter and stressed that the patient has multiple comorbidities to include chronic heart failure, chronic respiratory failure and advanced age, debility and frailty and increasing oxygen requirements prior to admission are suggestive of very poor long-term prognosis.  Agree with palliative care consultation which has been obtained.   Pertinent  Medical Hi  Nonischemic CM LVEF 40-45% Chronic Atrial fib Chronic respiratory failure with hypoxia Chronic combined systolic and diastolic HF RV dysfunction per recent echos "Mild" Pulmonary HTN (suspect underestimated) Emphysema (likely senescent) - noted on CT Hypothyroidism Cerebrovascular Disease, S/P CVA (residual L hemiparesis and tremors) CKD 3b   Significant Hospital Events: Including procedures, antibiotic start and stop dates in addition to other pertinent events   02/26 Admitted with acute on chronic resp failure 03/01 PCCM consulted due to inability to wean off of high flow O2  Interim History / Subjective:  Complains of "feeling bad", shortness of breath, difficulty in bringing up secretions.  Weak cough mechanics noted.  Objective   Blood pressure (!) 93/54, pulse 93, temperature 97.9 F (36.6 C), temperature source Oral, resp. rate 15, height 4\' 9"  (1.448 m), weight 51.3 kg, SpO2 92%.    FiO2 (%):  [60 %-75 %] 75 %   Intake/Output  Summary (Last 24 hours) at 08/15/2023 1417 Last data filed at 08/15/2023 0830 Gross per 24 hour  Intake 1320 ml  Output 1150 ml  Net 170 ml   Filed Weights   08/12/23 1653 08/14/23 0500 08/15/23 0414  Weight: 49.9 kg 51.8 kg 51.3 kg   SpO2:  92 % O2 Flow Rate (L/min): 45 L/min FiO2 (%): 75 %  Examination: GENERAL: Very debilitated [place] woman, chronically ill-appearing,sitting up in cardiac position in bed.  Heated nasal cannula in place.  Looks uncomfortable, mildly tachypneic.  Weak cough, wet sounding voice. HEAD: Normocephalic, atraumatic.  EYES: Pupils equal, round, reactive to light.  No scleral icterus.  MOUTH: Poor dentition, oral mucosa moist. NECK: Supple. No thyromegaly. Trachea midline. No JVD.  No adenopathy. PULMONARY: Good air entry bilaterally.  Diffuse rhonchi throughout. CARDIOVASCULAR: S1 and S2.  Irregularly irregular, rate controlled.  No overt murmurs heard. ABDOMEN: Nontender, nondistended, soft, no tenderness elicited. MUSCULOSKELETAL: Osteoarthritis changes of both hands, no clubbing, 2+ anasarca.  NEUROLOGIC: Left hemiparesis. SKIN: Intact,warm,dry. PSYCH: Cannot assess due to the acuity of the situation.  Reviewed imaging  02/26 CT chest w/o contrast no aucte changes. Emphysema, scattered granulomas.  Assessment & Plan:  Acute on chronic respiratory failure with hypoxia Poor cough mechanics with retained secretions Etiology:? Emphysema, ? PAH Purulent sputum noted but no other signs of infection Continue supplemental oxygen with high flow O2 wean for sats of 88 to 92% Adjust nebulization treatments Avoid antimuscarinic agents due to drying effect on secretions Trial of Mucomyst Vest physiotherapy for secretion management Consider speech pathology evaluation query chronic aspiration (history of prior CVA) Patient does not have bronchospasm, consider taper of steroids quickly  Emphysema likely senescent Reinstitute Brovana Continue Pulmicort In this situation avoid antimuscarinic agents due to tenacious secretions  Acute on chronic combined systolic and diastolic heart failure RV dysfunction noted as well on echocardiogram Query significant pulmonary artery hypertension This may be  underestimated by echo Continue diuresis as tolerated  Chronic atrial fibrillation Continue anticoagulation  Advanced age, frailty and debility Adult failure to thrive Bodes for poor prognosis PT/OT assessment as able Agree with palliative care consultation  Labs   CBC: Recent Labs  Lab 08/12/23 1604 08/13/23 0400  WBC 7.6 4.3  HGB 13.4 13.1  HCT 44.8 43.5  MCV 82.5 82.2  PLT 178 165    Basic Metabolic Panel: Recent Labs  Lab 08/12/23 1604 08/13/23 0400 08/14/23 0458 08/15/23 0533  NA 139 141 137 137  K 4.7 4.8 4.8 4.5  CL 109 110 104 105  CO2 22 23 20* 21*  GLUCOSE 103* 169* 161* 134*  BUN 60* 59* 61* 68*  CREATININE 1.77* 1.60* 1.77* 1.58*  CALCIUM 8.8* 8.7* 8.7* 8.6*   GFR: Estimated Creatinine Clearance: 17 mL/min (A) (by C-G formula based on SCr of 1.58 mg/dL (H)). Recent Labs  Lab 08/12/23 1604 08/13/23 0400  WBC 7.6 4.3    Liver Function Tests: Recent Labs  Lab 08/13/23 0400  AST 31  ALT 16  ALKPHOS 64  BILITOT 1.1  PROT 6.9  ALBUMIN 3.5   Results for orders placed or performed during the hospital encounter of 08/12/23  Resp panel by RT-PCR (RSV, Flu A&B, Covid) Anterior Nasal Swab     Status: None   Collection Time: 08/12/23 10:31 PM   Specimen: Anterior Nasal Swab  Result Value Ref Range Status   SARS Coronavirus 2 by RT PCR NEGATIVE NEGATIVE Final    Comment: (NOTE) SARS-CoV-2 target nucleic acids are NOT  DETECTED.  The SARS-CoV-2 RNA is generally detectable in upper respiratory specimens during the acute phase of infection. The lowest concentration of SARS-CoV-2 viral copies this assay can detect is 138 copies/mL. A negative result does not preclude SARS-Cov-2 infection and should not be used as the sole basis for treatment or other patient management decisions. A negative result may occur with  improper specimen collection/handling, submission of specimen other than nasopharyngeal swab, presence of viral mutation(s) within  the areas targeted by this assay, and inadequate number of viral copies(<138 copies/mL). A negative result must be combined with clinical observations, patient history, and epidemiological information. The expected result is Negative.  Fact Sheet for Patients:  BloggerCourse.com  Fact Sheet for Healthcare Providers:  SeriousBroker.it  This test is no t yet approved or cleared by the [place] FDA and  has been authorized for detection and/or diagnosis of SARS-CoV-2 by FDA under an Emergency Use Authorization (EUA). This EUA will remain  in effect (meaning this test can be used) for the duration of the COVID-19 declaration under Section 564(b)(1) of the Act, 21 U.S.C.section 360bbb-3(b)(1), unless the authorization is terminated  or revoked sooner.       Influenza A by PCR NEGATIVE NEGATIVE Final   Influenza B by PCR NEGATIVE NEGATIVE Final    Comment: (NOTE) The Xpert Xpress SARS-CoV-2/FLU/RSV plus assay is intended as an aid in the diagnosis of influenza from Nasopharyngeal swab specimens and should not be used as a sole basis for treatment. Nasal washings and aspirates are unacceptable for Xpert Xpress SARS-CoV-2/FLU/RSV testing.  Fact Sheet for Patients: BloggerCourse.com  Fact Sheet for Healthcare Providers: SeriousBroker.it  This test is not yet approved or cleared by the [place] FDA and has been authorized for detection and/or diagnosis of SARS-CoV-2 by FDA under an Emergency Use Authorization (EUA). This EUA will remain in effect (meaning this test can be used) for the duration of the COVID-19 declaration under Section 564(b)(1) of the Act, 21 U.S.C. section 360bbb-3(b)(1), unless the authorization is terminated or revoked.     Resp Syncytial Virus by PCR NEGATIVE NEGATIVE Final    Comment: (NOTE) Fact Sheet for  Patients: BloggerCourse.com  Fact Sheet for Healthcare Providers: SeriousBroker.it  This test is not yet approved or cleared by the [place] FDA and has been authorized for detection and/or diagnosis of SARS-CoV-2 by FDA under an Emergency Use Authorization (EUA). This EUA will remain in effect (meaning this test can be used) for the duration of the COVID-19 declaration under Section 564(b)(1) of the Act, 21 U.S.C. section 360bbb-3(b)(1), unless the authorization is terminated or revoked.  Performed at [facility], [address] [place], [place] [address]   Respiratory (~20 pathogens) panel by PCR     Status: None   Collection Time: 08/14/23  8:00 AM   Specimen: Nasopharyngeal Swab; Respiratory  Result Value Ref Range Status   Adenovirus NOT DETECTED NOT DETECTED Final   Coronavirus 229E NOT DETECTED NOT DETECTED Final    Comment: (NOTE) The Coronavirus on the Respiratory Panel, DOES NOT test for the novel  Coronavirus (2019 nCoV)    Coronavirus HKU1 NOT DETECTED NOT DETECTED Final   Coronavirus NL63 NOT DETECTED NOT DETECTED Final   Coronavirus OC43 NOT DETECTED NOT DETECTED Final   Metapneumovirus NOT DETECTED NOT DETECTED Final   Rhinovirus / Enterovirus NOT DETECTED NOT DETECTED Final   Influenza A NOT DETECTED NOT DETECTED Final   Influenza B NOT DETECTED NOT DETECTED Final   Parainfluenza Virus 1  NOT DETECTED NOT DETECTED Final   Parainfluenza Virus 2 NOT DETECTED NOT DETECTED Final   Parainfluenza Virus 3 NOT DETECTED NOT DETECTED Final   Parainfluenza Virus 4 NOT DETECTED NOT DETECTED Final   Respiratory Syncytial Virus NOT DETECTED NOT DETECTED Final   Bordetella pertussis NOT DETECTED NOT DETECTED Final   Bordetella Parapertussis NOT DETECTED NOT DETECTED Final   Chlamydophila pneumoniae NOT DETECTED NOT DETECTED Final   Mycoplasma pneumoniae NOT DETECTED NOT DETECTED Final    Comment: Performed at  [facility], [address], [place], [place] [address]     ABG    Component Value Date/Time   PHART 7.36 07/30/2023 2022   PCO2ART 32 07/30/2023 2022   PO2ART 88 07/30/2023 2022   HCO3 27.8 08/03/2023 1521   ACIDBASEDEF 6.3 (H) 07/30/2023 2022   O2SAT 26.3 08/03/2023 1521     Coagulation Profile: No results for input(s): "INR", "PROTIME" in the last 168 hours.  Cardiac Enzymes: No results for input(s): "CKTOTAL", "CKMB", "CKMBINDEX", "TROPONINI" in the last 168 hours.  HbA1C: Hemoglobin A1C  Date/Time Value Ref Range Status  05/18/2023 12:00 AM 6.4  Final  10/30/2022 12:00 AM 6.5  Final    CBG: No results for input(s): "GLUCAP" in the last 168 hours.  Review of Systems:   A 10 point review of systems was performed and it is as noted above otherwise negative.  Past Medical History:  She,  has a past medical history of Acute exacerbation of CHF (congestive heart failure) (HCC) (11/18/2021), AKI (acute kidney injury) (HCC) (11/26/2021), Anemia, CHF (congestive heart failure) (HCC), Diabetes mellitus without complication (HCC) (03/13/2017), Dilated idiopathic cardiomyopathy (HCC), Dizziness (02/18/2017), GERD (gastroesophageal reflux disease), Hyperkalemia (11/26/2021), Hyperlipidemia, Hypertension, Hyponatremia (06/09/2017), Hypothyroidism, Osteoporosis, and Renal disorder.   Surgical History:   Past Surgical History:  Procedure Laterality Date   BUNIONECTOMY     COLONOSCOPY     ESOPHAGOGASTRODUODENOSCOPY (EGD) WITH PROPOFOL N/A 10/15/2017   Procedure: ESOPHAGOGASTRODUODENOSCOPY (EGD) WITH PROPOFOL;  Surgeon: Christena Deem, MD;  Location: [facility] ENDOSCOPY;  Service: Endoscopy;  Laterality: N/A;   KYPHOPLASTY N/A 06/12/2017   Procedure: [id number];  Surgeon: Kennedy Bucker, MD;  Location: [facility] ORS;  Service: Orthopedics;  Laterality: N/A;   TONSILLECTOMY       Social History:   reports that she quit smoking about 39 years ago. Her smoking use included  cigarettes. She has never used smokeless tobacco. She reports that she does not drink alcohol and does not use drugs.   Family History:  Her family history includes Premature CHD in her brother.   Allergies Allergies  Allergen Reactions   Ciprofloxacin    Digoxin And Related    Entresto [Sacubitril-Valsartan] Other (See Comments)    Hypotensive    Erythromycin    Lopid [Gemfibrozil]    Nitrofurantoin    Spironolactone Other (See Comments)    Hospitalized for dehydration   Cefuroxime Rash   Penicillins Rash    Has patient had a PCN reaction causing immediate rash, facial/tongue/throat swelling, SOB or lightheadedness with hypotension: Unknown Has patient had a PCN reaction causing severe rash involving mucus membranes or skin necrosis: No Has patient had a PCN reaction that required hospitalization: No Has patient had a PCN reaction occurring within the last 10 years: No If all of the above answers are "NO", then may proceed with Cephalosporin use.      Home Medications  Prior to Admission medications   Medication Sig Start Date End Date Taking? Authorizing Provider  acetaminophen (  TYLENOL) 325 MG tablet Take 2 tablets (650 mg total) by mouth every 4 (four) hours as needed for headache or mild pain. 11/29/21  Yes Esaw Grandchild A, DO  alendronate (FOSAMAX) 70 MG tablet Take 70 mg by mouth once a week. Take with a full glass of water on an empty stomach.   Yes [provider]  apixaban (ELIQUIS) 2.5 MG TABS tablet Take 1 tablet (2.5 mg total) by mouth 2 (two) times daily. Start on 11/30/2021 11/29/21  Yes Esaw Grandchild A, DO  ARTIFICIAL TEAR SOLUTION OP Apply 1 drop to eye 2 (two) times daily. Both Eyes.   Yes [provider]  atorvastatin (LIPITOR) 80 MG tablet Take 1 tablet (80 mg total) by mouth daily. 11/30/21  Yes Esaw Grandchild A, DO  budesonide (PULMICORT) 0.25 MG/2ML nebulizer solution Take 2 mLs (0.25 mg total) by nebulization in the morning and at  bedtime. 05/06/23  Yes Assaker, [place], MD  Cholecalciferol (D3-1000) 25 MCG (1000 UT) capsule Take 1,000 Units by mouth daily.   Yes [provider]  Dextromethorphan-guaiFENesin 20-400 MG TABS Take 1 tablet by mouth 2 (two) times daily.   Yes [provider]  diclofenac Sodium (VOLTAREN) 1 % GEL Apply 2 g topically daily as needed (pain). Apply to back topically every day shift for pain.   Yes [provider]  ferrous sulfate 325 (65 FE) MG EC tablet Take 325 mg by mouth. Every Monday, Wednesday and Friday.   Yes [provider]  furosemide (LASIX) 40 MG tablet Take 40 mg by mouth daily as needed (for 3lbs weight gain overnight or 5lbs in/week).   Yes [provider]  hydrocortisone (ANUSOL-HC) 25 MG suppository Place 25 mg rectally 2 (two) times daily as needed for hemorrhoids or anal itching.   Yes [provider]  levalbuterol (XOPENEX) 1.25 MG/0.5ML nebulizer solution Take 1.25 mg by nebulization every 4 (four) hours as needed for wheezing or shortness of breath. 08/10/23 08/09/24 Yes Assaker, [place], MD  levothyroxine (SYNTHROID) 50 MCG tablet Take 50 mcg by mouth at bedtime.   Yes [provider]  Nutritional Supplements (,FEEDING SUPPLEMENT, PROSOURCE PLUS) liquid Take 30 mLs by mouth daily.   Yes [provider]  Nutritional Supplements (ENSURE ENLIVE PO) Take 1 Can by mouth as directed. After meals if she eats less than 50 % due to poor appetite and weight loss s/p covid   Yes [provider]  OXYGEN 4lpm via [place] for SOB continuous.   Yes [provider]  pantoprazole (PROTONIX) 40 MG tablet Take 40 mg by mouth in the morning and at bedtime.   Yes [provider]  polyethylene glycol (MIRALAX / GLYCOLAX) 17 g packet Take 17 g by mouth every other day.   Yes [provider]  potassium chloride (KLOR-CON) 20 MEQ packet Take 20 mEq by mouth daily.   Yes [provider]   senna-docusate (SENOKOT-S) 8.6-50 MG tablet Take 2 tablets by mouth daily.   Yes [provider]  sertraline (ZOLOFT) 50 MG tablet Take 50 mg by mouth daily.   Yes [provider]  torsemide (DEMADEX) 20 MG tablet Take torsemide 20 mg every Monday, Wednesday, Friday and Sunday. Take torsemide 40 mg every Tuesday, Thursday, Saturday 08/03/23  Yes Lurene Shadow, MD  arformoterol (BROVANA) 15 MCG/2ML NEBU Take 2 mLs (15 mcg total) by nebulization in the morning and at bedtime. Patient not taking: Reported on 08/13/2023 05/06/23   Janann Colonel, MD  ipratropium (ATROVENT) 0.02 %  nebulizer solution Take 2.5 mLs (0.5 mg total) by nebulization 4 (four) times daily. Patient not taking: Reported on 08/13/2023 08/12/23   Janann Colonel, MD  revefenacin (YUPELRI) 175 MCG/3ML nebulizer solution Take 3 mLs (175 mcg total) by nebulization daily. Patient not taking: Reported on 08/13/2023 08/10/23   Janann Colonel, MD    Scheduled Meds:  apixaban  2.5 mg Oral BID   atorvastatin  80 mg Oral Daily   budesonide (PULMICORT) nebulizer solution  0.5 mg Nebulization BID   dextromethorphan-guaiFENesin  1 tablet Oral BID   feeding supplement  237 mL Oral BID BM   ferrous sulfate  325 mg Oral Q breakfast   furosemide  40 mg Intravenous BID   ipratropium-albuterol  3 mL Nebulization Q6H   levothyroxine  50 mcg Oral Q0600   methylPREDNISolone (SOLU-MEDROL) injection  40 mg Intravenous Q12H   pantoprazole  40 mg Oral BID   polyethylene glycol  17 g Oral QODAY   sertraline  50 mg Oral Daily   Continuous Infusions: PRN Meds:.acetaminophen **OR** acetaminophen, diclofenac Sodium, HYDROcodone-acetaminophen, levalbuterol, ondansetron **OR** ondansetron (ZOFRAN) IV   Level 4 consult    I discussed the case with respiratory therapy and with Dr. Jamelle Rushing via secure chat.  Please see orders for details.  Plan of care from respiratory standpoint was discussed in detail with RRT  and agreed upon.  Updated patient's daughter at bedside.  Overall long-term prognosis is poor.   Gailen Shelter, MD Advanced Bronchoscopy PCCM [facility]    *This note was generated using voice recognition software/Dragon and/or AI transcription program.  Despite best efforts to proofread, errors can occur which can change the meaning. Any transcriptional errors that result from this process are unintentional and may not be fully corrected at the time of dictation.

## 2023-08-15 NOTE — Progress Notes (Signed)
       CROSS COVER NOTE  NAME: Jacqueline [name] MRN: [id number] DOB : [birth year] ATTENDING PHYSICIAN: Leeroy Bock, MD    Date of Service   08/15/2023   HPI/Events of Note   Rapid was called secondary to desat episode with tachycardia during position change   Interventions   Assessment/Plan:    08/15/2023    1:43 PM 08/15/2023    9:08 AM 08/15/2023    4:14 AM  Vitals with BMI  Weight   113 lbs 2 oz  BMI   24.47  Systolic  93   Diastolic  54   Pulse 93 82    [place] without overt increase work of breathing but says she is short of breath and sats 85 with NRB in place  100 HHHf placed under the NRB and sats above 90 in just a few minutes. Still reports feeling short of breath. Total dose of 1 mg morphine given for air hunger CXR without acute findings no overt pulmonary edema Continue HHFL and NRB therapies, wean as tolerated        Donnie Mesa NP [facility] Cross Cover 7pm-7am - check amion for availability Pager [phone]

## 2023-08-15 NOTE — Progress Notes (Signed)
   08/15/23 2000  Spiritual Encounters  Type of Visit Initial  Care provided to: Family  Referral source Code page  Reason for visit Code  OnCall Visit Yes   Chaplain responded to RR code and provided support to family members present.

## 2023-08-15 NOTE — Progress Notes (Signed)
 RR was called due to pts severe dyspnea & desats shortly after being repositioned in bed.  SVN tx given, VEST tx withheld.  Pt showed improvement after O2 increased on HFC to 100%

## 2023-08-15 NOTE — Plan of Care (Signed)
  Problem: Education: Goal: Knowledge of General Education information will improve Description: Including pain rating scale, medication(s)/side effects and non-pharmacologic comfort measures Outcome: Progressing   Problem: Health Behavior/Discharge Planning: Goal: Ability to manage health-related needs will improve Outcome: Progressing   Problem: Clinical Measurements: Goal: Ability to maintain clinical measurements within normal limits will improve Outcome: Progressing Goal: Diagnostic test results will improve Outcome: Progressing   Problem: Coping: Goal: Level of anxiety will decrease Outcome: Progressing   Problem: Elimination: Goal: Will not experience complications related to bowel motility Outcome: Progressing Goal: Will not experience complications related to urinary retention Outcome: Progressing   Problem: Pain Managment: Goal: General experience of comfort will improve and/or be controlled Outcome: Progressing

## 2023-08-15 NOTE — Progress Notes (Signed)
 PT Cancellation Note  Patient Details Name: Jacqueline [name] MRN: [id number] DOB: [birth year]   Cancelled Treatment:    Reason Eval/Treat Not Completed: Other (comment). Pt with respiratory at bedside for respiratory treatment. PT to re-attempt as able.   Olga Coaster PT, DPT 3:34 PM,08/15/23

## 2023-08-15 NOTE — Progress Notes (Signed)
 [facility] Cardiology  SUBJECTIVE: Patient laying in bed with high flow nasal cannula, appears comfortable   Vitals:   08/15/23 0018 08/15/23 0019 08/15/23 0409 08/15/23 0414  BP: 110/67  (!) 106/57   Pulse: 88  86   Resp: 14  16   Temp:  98.5 F (36.9 C) 97.7 F (36.5 C)   TempSrc:  Oral Oral   SpO2: 94%  92%   Weight:    51.3 kg  Height:         Intake/Output Summary (Last 24 hours) at 08/15/2023 1610 Last data filed at 08/15/2023 0413 Gross per 24 hour  Intake 1080 ml  Output 1150 ml  Net -70 ml      PHYSICAL EXAM  General: Well developed, well nourished, in no acute distress HEENT:  Normocephalic and atramatic Neck:  No JVD.  Lungs: Clear bilaterally to auscultation and percussion. Heart: HRRR . Normal S1 and S2 without gallops or murmurs.  Abdomen: Bowel sounds are positive, abdomen soft and non-tender  Msk:  Back normal, normal gait. Normal strength and tone for age. Extremities: No clubbing, cyanosis or edema.   Neuro: Alert and oriented X 3. Psych:  Good affect, responds appropriately   LABS: Basic Metabolic Panel: Recent Labs    08/14/23 0458 08/15/23 0533  NA 137 137  K 4.8 4.5  CL 104 105  CO2 20* 21*  GLUCOSE 161* 134*  BUN 61* 68*  CREATININE 1.77* 1.58*  CALCIUM 8.7* 8.6*   Liver Function Tests: Recent Labs    08/13/23 0400  AST 31  ALT 16  ALKPHOS 64  BILITOT 1.1  PROT 6.9  ALBUMIN 3.5   No results for input(s): "LIPASE", "AMYLASE" in the last 72 hours. CBC: Recent Labs    08/12/23 1604 08/13/23 0400  WBC 7.6 4.3  HGB 13.4 13.1  HCT 44.8 43.5  MCV 82.5 82.2  PLT 178 165   Cardiac Enzymes: No results for input(s): "CKTOTAL", "CKMB", "CKMBINDEX", "TROPONINI" in the last 72 hours. BNP: Invalid input(s): "POCBNP" D-Dimer: No results for input(s): "DDIMER" in the last 72 hours. Hemoglobin A1C: No results for input(s): "HGBA1C" in the last 72 hours. Fasting Lipid Panel: No results for input(s): "CHOL", "HDL", "LDLCALC", "TRIG",  "CHOLHDL", "LDLDIRECT" in the last 72 hours. Thyroid Function Tests: No results for input(s): "TSH", "T4TOTAL", "T3FREE", "THYROIDAB" in the last 72 hours.  Invalid input(s): "FREET3" Anemia Panel: No results for input(s): "VITAMINB12", "FOLATE", "FERRITIN", "TIBC", "IRON", "RETICCTPCT" in the last 72 hours.  No results found.   Echo EF 40-45% 07/31/2023  TELEMETRY: Atrial fibrillation 93 bpm:  ASSESSMENT AND PLAN:  Principal Problem:   Acute on chronic respiratory failure with hypoxia and hypercapnia (HCC) Active Problems:   Hypothyroidism   Chest pain   Elevated troponin   Acute renal failure superimposed on stage 3a chronic kidney disease (HCC)   Depression   Chronic atrial fibrillation (HCC)   Age-related osteoporosis without current pathological fracture   Dilated idiopathic cardiomyopathy (HCC)   COPD with acute exacerbation (HCC)   History of cerebrovascular accident (CVA) with residual deficit   Acute on chronic HFrEF (heart failure with reduced ejection fraction) (HCC)   Frailty    1.  Acute on chronic HFmrEF, EF 40-45% 07/31/2023, mild peripheral edema, on furosemide 40 mg IV twice daily, BUN and creatinine stable 2.  Chronic atrial fibrillation, controlled heart rate, on Eliquis for stroke prevention 3.  COPD exacerbation, respiratory failure, on IV steroids and multiple inhalers, on high flow nasal cannula  Recommendations  1.  Agree with current therapy 2.  Continue diuresis 3.  Carefully monitor renal status 4.  Continue low-dose Eliquis for stroke prevention   Marcina Millard, MD, PhD, [facility] 08/15/2023 9:05 AM

## 2023-08-15 NOTE — Progress Notes (Signed)
 PT Cancellation Note  Patient Details Name: Jacqueline [name] MRN: [id number] DOB: [birth year]   Cancelled Treatment:    Reason Eval/Treat Not Completed: Other (comment) (Chart reviewed and PT evaluation attempted but was unable to complete. Plan to re-attempt tomorrow as appropriate.)   Luretha Murphy. Ilsa Iha, PT, DPT 08/15/23, 3:28 PM

## 2023-08-15 NOTE — Progress Notes (Signed)
 PROGRESS NOTE  Jacqueline [name]    DOB: 1932/09/23, 89 y.o.  ZOX:[id number]    Code Status: Limited: Do not attempt resuscitation (DNR) -DNR-LIMITED -Do Not Intubate/DNI    DOA: 08/12/2023   LOS: 3   Brief hospital course  Jacqueline [name] is a 89 y.o. female with medical history significant for NSTEMI 11/2021, T2DM, paroxysmal atrial fibrillation on Eliquis, CVA 11/2021, HTN, hypothyroidism, anemia, COPD on home O2 at 4 L, HFrEF with improved EF secondary to NICM, (EF 40-45% 07/31/23, down from 50% 02/2023 but previously 20-25% 2023), and recently hospitalized from 2/13 to 08/03/2023 with respiratory failure attributed to acute on chronic HFrEF, improved with high-dose Lasix. They are presenting with worsening respiratory failure, requiring 10 L high flow nasal cannula in the ED, now being attributed to COPD.  ED course: HR 70s to 80s on arrival, respirations in the teens to low 20s, O2 sat 87% on 4 L subsequently requiring up to 15 L HFNC to maintain sats in the high end 90s subsequently down titrated to 10 L HFNC by admission with sats in the mid 90s.normal CBC Troponin of 37 (was in the 50s last admission) BNP 1847 (up from 1263 a couple weeks prior) Creatinine 1.77 up from 1.55 UA with large leukocytes EKG: a flutter with variable AV block at a rate of 78 with T wave inversion in lateral leads CT chest with contrast showing no acute abnormality and otherwise stable findings from prior exams  Patient treated for COPD with DuoNebs and Solu-Medrol with some improvement.  2/27-2/28/25 -remains on high level of oxygen. Appears comfortable. Lung example remarkable for shallow breaths. Rate controlled on monitor. Cardiology following. Pulmonology consulted  Assessment & Plan  Principal Problem:   Acute on chronic respiratory failure with hypoxia and hypercapnia (HCC) Active Problems:   COPD with acute exacerbation (HCC)   Chest pain   Elevated troponin   Dilated idiopathic cardiomyopathy (HCC)    Acute on chronic HFrEF (heart failure with reduced ejection fraction) (HCC)   Acute renal failure superimposed on stage 3a chronic kidney disease (HCC)   Chronic atrial fibrillation (HCC)   Frailty   History of cerebrovascular accident (CVA) with residual deficit   Hypothyroidism   Depression   Age-related osteoporosis without current pathological fracture   Acute on chronic respiratory failure with hypoxia and hypercapnia  Suspect related to combination of COPD and CHF, superimposed on underlying baseline frailty, physical deconditioning and advanced age CTA chest was nonacute, resp panel negative.  Patient continues on 45L 60% HHFNC today due to continued hypoxia at rest and very low tolerance of exertion - breathing treatments being adjusted by pulmonology  - ICS scheduled - continue lasix with volume status monitoring - wean oxygen to 4L as tolerated - IV steroids - Antitussives  - flutter valve - RT consulted - palliative consulted   Acute on chronic HFrEF Nonischemic cardiomyopathy, EF 40 to 45% 07/31/2023 EF 40-45% 07/31/23, down from 50% 02/2023 but previously 20-25% 2023, BNP remains elevated at 1847, up from 1263 at discharge a couple days prior, however CTA chest not showing pulmonary congestion. Output has not been recorded - cardiology following  - continue lasix   Chest pain  Elevated troponin.  History of NSTEMI 11/2021 Continue atorvastatin Cardiology believes most consistent with demand/mismatch   CKD 3a- Cr appears to be at baseline today   Chronic afib- Was treated with a dose of metoprolol at her facility for heart rate in the 200s - Continue apixaban - no  rate control medications at this time and is rate controlled   Frailty Patient with advanced age, debility frequent admissions multiple chronic medical conditions Mortality in 6 months will not be unexpected Palliative care consult might be appropriate - PT/OT once breathing has improved   History of  cerebrovascular accident (CVA) with residual deficit Has residual left hemiparesis and tremors PT consult Continue apixaban   Hypothyroidism Continue levothyroxine Last TSH a month ago was 5.8   Depression Continue sertraline   Age-related osteoporosis without current pathological fracture No concerning issues this admission  Body mass index is 24.47 kg/m.  VTE ppx: apixaban (ELIQUIS) tablet 2.5 mg Start: 08/12/23 2230 apixaban (ELIQUIS) tablet 2.5 mg   Diet:     Diet   Diet heart healthy/carb modified Room service appropriate? Yes; Fluid consistency: Thin   Consultants: Cardiology   Subjective 08/15/23    When asked how she is doing today, she says, "OK". Denies SOB at rest. Daughter at bedside confirms she would not want intubation if needed.    Objective   Vitals:   08/15/23 0018 08/15/23 0019 08/15/23 0409 08/15/23 0414  BP: 110/67  (!) 106/57   Pulse: 88  86   Resp: 14  16   Temp:  98.5 F (36.9 C) 97.7 F (36.5 C)   TempSrc:  Oral Oral   SpO2: 94%  92%   Weight:    51.3 kg  Height:        Intake/Output Summary (Last 24 hours) at 08/15/2023 0748 Last data filed at 08/15/2023 0413 Gross per 24 hour  Intake 1080 ml  Output 1150 ml  Net -70 ml   Filed Weights   08/12/23 1653 08/14/23 0500 08/15/23 0414  Weight: 49.9 kg 51.8 kg 51.3 kg    Physical Exam:  General: awake, alert, NAD. Tired-appearing  HEENT: hard of hearing. Respiratory: normal respiratory effort. Shallow breaths, no rales or wheezing Cardiovascular: quick capillary refill, normal S1/S2, RRR, no JVD, murmurs Gastrointestinal: soft, NT, ND Nervous: Alert and able to follow commands. Left sided tremor and weakness which daughter states has been her baseline for months Extremities: no edema, normal tone Skin: dry, intact, normal temperature, normal color. No rashes, lesions or ulcers on exposed skin Psychiatry: flat mood, congruent affect  Labs   I have personally reviewed the following  labs and imaging studies CBC    Component Value Date/Time   WBC 4.3 08/13/2023 0400   RBC 5.29 (H) 08/13/2023 0400   HGB 13.1 08/13/2023 0400   HGB 13.4 06/15/2014 0346   HCT 43.5 08/13/2023 0400   HCT 40.1 06/15/2014 0346   PLT 165 08/13/2023 0400   PLT 206 06/15/2014 0346   MCV 82.2 08/13/2023 0400   MCV 94 06/15/2014 0346   MCH 24.8 (L) 08/13/2023 0400   MCHC 30.1 08/13/2023 0400   RDW 21.6 (H) 08/13/2023 0400   RDW 13.3 06/15/2014 0346   LYMPHSABS 1.6 08/03/2023 1506   LYMPHSABS 1.4 06/15/2014 0346   MONOABS 0.5 08/03/2023 1506   MONOABS 0.6 06/15/2014 0346   EOSABS 0.0 08/03/2023 1506   EOSABS 0.2 06/15/2014 0346   BASOSABS 0.0 08/03/2023 1506   BASOSABS 0.0 06/15/2014 0346      Latest Ref Rng & Units 08/15/2023    5:33 AM 08/14/2023    4:58 AM 08/13/2023    4:00 AM  BMP  Glucose 70 - 99 mg/dL 914  782  956   BUN 8 - 23 mg/dL 68  61  59   Creatinine  0.44 - 1.00 mg/dL 1.61  0.96  0.45   Sodium 135 - 145 mmol/L 137  137  141   Potassium 3.5 - 5.1 mmol/L 4.5  4.8  4.8   Chloride 98 - 111 mmol/L 105  104  110   CO2 22 - 32 mmol/L 21  20  23    Calcium 8.9 - 10.3 mg/dL 8.6  8.7  8.7     No results found.   Disposition Plan & Communication  Patient status: Inpatient  Admitted From: SNF Planned disposition location: Skilled nursing facility Anticipated discharge date: TBD pending respiratory improvement   Family Communication: daughter at bedside    Author: Leeroy Bock, DO [facility] 08/15/2023, 7:48 AM   Available by Epic secure chat 7AM-7PM. If 7PM-7AM, please contact night-coverage.  [facility] contact information found on [email or web address].

## 2023-08-15 NOTE — Care Management Important Message (Signed)
 Important Message  Patient Details  Name: Jacqueline [name] MRN: [id number] Date of Birth: 1933/01/15   Important Message Given:  Yes - Medicare IM     Cristela Blue, CMA 08/15/2023, 9:42 AM

## 2023-08-15 DEATH — deceased

## 2023-08-16 DIAGNOSIS — Z515 Encounter for palliative care: Secondary | ICD-10-CM | POA: Diagnosis not present

## 2023-08-16 DIAGNOSIS — I5043 Acute on chronic combined systolic (congestive) and diastolic (congestive) heart failure: Secondary | ICD-10-CM | POA: Diagnosis not present

## 2023-08-16 DIAGNOSIS — J439 Emphysema, unspecified: Secondary | ICD-10-CM | POA: Diagnosis not present

## 2023-08-16 DIAGNOSIS — R627 Adult failure to thrive: Secondary | ICD-10-CM | POA: Diagnosis not present

## 2023-08-16 DIAGNOSIS — J9621 Acute and chronic respiratory failure with hypoxia: Secondary | ICD-10-CM | POA: Diagnosis not present

## 2023-08-16 DIAGNOSIS — N179 Acute kidney failure, unspecified: Secondary | ICD-10-CM

## 2023-08-16 DIAGNOSIS — J9622 Acute and chronic respiratory failure with hypercapnia: Secondary | ICD-10-CM | POA: Diagnosis not present

## 2023-08-16 LAB — BASIC METABOLIC PANEL
Anion gap: 12 (ref 5–15)
BUN: 77 mg/dL — ABNORMAL HIGH (ref 8–23)
CO2: 21 mmol/L — ABNORMAL LOW (ref 22–32)
Calcium: 8.9 mg/dL (ref 8.9–10.3)
Chloride: 104 mmol/L (ref 98–111)
Creatinine, Ser: 1.96 mg/dL — ABNORMAL HIGH (ref 0.44–1.00)
GFR, Estimated: 24 mL/min — ABNORMAL LOW (ref >60)
Glucose, Bld: 166 mg/dL — ABNORMAL HIGH (ref 70–99)
Potassium: 4.3 mmol/L (ref 3.5–5.1)
Sodium: 137 mmol/L (ref 135–145)

## 2023-08-16 MED ORDER — ACETAMINOPHEN 325 MG PO TABS: 650.0000 mg | ORAL_TABLET | Freq: Four times a day (QID) | ORAL | Status: DC | PRN

## 2023-08-16 MED ORDER — PANTOPRAZOLE SODIUM 40 MG PO TBEC: 40.0000 mg | DELAYED_RELEASE_TABLET | Freq: Every day | ORAL | Status: DC | PRN

## 2023-08-16 MED ORDER — POLYVINYL ALCOHOL 1.4 % OP SOLN: 1.0000 [drp] | Freq: Four times a day (QID) | OPHTHALMIC | Status: DC | PRN

## 2023-08-16 MED ORDER — GLYCOPYRROLATE 0.2 MG/ML IJ SOLN: 0.2000 mg | INTRAMUSCULAR | Status: DC | PRN

## 2023-08-16 MED ORDER — ORAL CARE MOUTH RINSE: 15.0000 mL | OROMUCOSAL | Status: DC | PRN

## 2023-08-16 MED ORDER — MORPHINE SULFATE (PF) 2 MG/ML IV SOLN
2.0000 mg | Freq: Once | INTRAVENOUS | Status: AC
  Administered 2023-08-16: 2 mg via INTRAVENOUS
  Filled 2023-08-16: qty 1

## 2023-08-16 MED ORDER — SODIUM CHLORIDE 0.9% FLUSH: 3.0000 mL | INTRAVENOUS | Status: DC | PRN

## 2023-08-16 MED ORDER — METHYLPREDNISOLONE SODIUM SUCC 40 MG IJ SOLR
40.0000 mg | Freq: Every day | INTRAMUSCULAR | Status: DC
  Administered 2023-08-17: 40 mg via INTRAVENOUS
  Filled 2023-08-16: qty 1

## 2023-08-16 MED ORDER — FUROSEMIDE 10 MG/ML IJ SOLN
40.0000 mg | Freq: Every day | INTRAMUSCULAR | Status: DC
  Administered 2023-08-17: 40 mg via INTRAVENOUS
  Filled 2023-08-16: qty 4

## 2023-08-16 MED ORDER — GLYCOPYRROLATE 1 MG PO TABS: 1.0000 mg | ORAL_TABLET | ORAL | Status: DC | PRN

## 2023-08-16 MED ORDER — MORPHINE SULFATE (PF) 2 MG/ML IV SOLN
2.0000 mg | INTRAVENOUS | Status: DC | PRN
  Administered 2023-08-17: 4 mg via INTRAVENOUS
  Administered 2023-08-17 (×2): 2 mg via INTRAVENOUS
  Administered 2023-08-17: 4 mg via INTRAVENOUS
  Filled 2023-08-16: qty 2
  Filled 2023-08-16: qty 1
  Filled 2023-08-16: qty 2
  Filled 2023-08-16: qty 1

## 2023-08-16 MED ORDER — POLYETHYLENE GLYCOL 3350 17 G PO PACK: 17.0000 g | PACK | Freq: Every day | ORAL | Status: DC | PRN

## 2023-08-16 MED ORDER — SODIUM CHLORIDE 0.9% FLUSH
3.0000 mL | Freq: Two times a day (BID) | INTRAVENOUS | Status: DC
  Administered 2023-08-16: 10 mL via INTRAVENOUS
  Administered 2023-08-16: 3 mL via INTRAVENOUS
  Administered 2023-08-17: 10 mL via INTRAVENOUS

## 2023-08-16 MED ORDER — ACETAMINOPHEN 650 MG RE SUPP: 650.0000 mg | Freq: Four times a day (QID) | RECTAL | Status: DC | PRN

## 2023-08-16 NOTE — Progress Notes (Signed)
 PT Cancellation Note  Patient Details Name: LILLYTH SPONG MRN: [id number] DOB: Feb 23, 1933   Cancelled Treatment:    Reason Eval/Treat Not Completed: Other (comment). PT to sign off, chart review noted for transition to comfort care.  Olga Coaster PT, DPT 11:39 AM,08/16/23

## 2023-08-16 NOTE — Plan of Care (Signed)

## 2023-08-16 NOTE — Progress Notes (Signed)
 PROGRESS NOTE  Jacqueline [name]    DOB: [birth year], 89 y.o.  ZOX:[id number]    Code Status: Limited: Do not attempt resuscitation (DNR) -DNR-LIMITED -Do Not Intubate/DNI    DOA: 08/12/2023   LOS: 4   Brief hospital course  Jacqueline [name] is a 89 y.o. female with medical history significant for NSTEMI 11/2021, T2DM, paroxysmal atrial fibrillation on Eliquis, CVA 11/2021, HTN, hypothyroidism, anemia, COPD on home O2 at 4 L, HFrEF with improved EF secondary to NICM, (EF 40-45% 07/31/23, down from 50% 02/2023 but previously 20-25% 2023), and recently hospitalized from 2/13 to 08/03/2023 with respiratory failure attributed to acute on chronic HFrEF, improved with high-dose Lasix. They are presenting with worsening respiratory failure, requiring 10 L high flow nasal cannula in the ED, now being attributed to COPD.  ED course: HR 70s to 80s on arrival, respirations in the teens to low 20s, O2 sat 87% on 4 L subsequently requiring up to 15 L HFNC to maintain sats in the high end 90s subsequently down titrated to 10 L HFNC by admission with sats in the mid 90s.normal CBC Troponin of 37 (was in the 50s last admission) BNP 1847 (up from 1263 a couple weeks prior) Creatinine 1.77 up from 1.55 UA with large leukocytes EKG: a flutter with variable AV block at a rate of 78 with T wave inversion in lateral leads CT chest with contrast showing no acute abnormality and otherwise stable findings from prior exams  2/27-08/15/23 -remains on high level of oxygen. Appears comfortable. Lung example remarkable for shallow breaths. Rate controlled on monitor. Cardiology following. Pulmonology consulted  3/2: patient has desaturations with even minimal movement in bed. Requiring maximum non-invasive oxygen support. Pulmonology consult, Dr. Jayme Cloud believes due to elevated pulmonary pressure and long-standing R and L HF worsening. Had not been able to sleep last night and appears in mild distress this morning. Still awaiting  palliative consult. Patient's daughter (hcpoa) and son in law at bedside discussed her prognosis with [place] and at this time elect to move forward with comfort care.   Assessment & Plan  Principal Problem:   Acute on chronic respiratory failure with hypoxia and hypercapnia (HCC) Active Problems:   COPD with acute exacerbation (HCC)   Chest pain   Elevated troponin   Dilated idiopathic cardiomyopathy (HCC)   Acute on chronic HFrEF (heart failure with reduced ejection fraction) (HCC)   Acute renal failure superimposed on stage 3a chronic kidney disease (HCC)   Chronic atrial fibrillation (HCC)   Frailty   History of cerebrovascular accident (CVA) with residual deficit   Hypothyroidism   Depression   Age-related osteoporosis without current pathological fracture   Acute on chronic respiratory failure with hypoxia and hypercapnia  Suspect related to combination of COPD and L and R HF, superimposed on underlying baseline frailty, physical deconditioning and advanced age CTA chest was nonacute, resp panel negative.  Patient increased to HHFNC 50L 100% oxygen overnight due to desaturation with repositioning in bed and unable to recover.  - pulmonology following, appreciate your input - at this time, transitioning to comfort care by request of family. They would like her to continue with lasix.  - wean from steroids  GOC- transitioning to comfort care - morphine started for apparent discomfort and ppx air hunger. Oxygen will be weaned as tolerated.  - transferred to comfort care floor - awaiting palliative consult which I think will be beneficial in discussing goals of oxygen dependence and withdrawal with family members.  Acute on chronic HFrEF Nonischemic cardiomyopathy, EF 40 to 45% 07/31/2023 EF 40-45% 07/31/23, down from 50% 02/2023 but previously 20-25% 2023, BNP remains elevated at 1847, up from 1263 at discharge a couple days prior, however CTA chest not showing pulmonary congestion.  Does not have overt hypervolemia on exam - continue lasix per family request   Chest pain  Elevated troponin.  History of NSTEMI 11/2021 - discontinue atorvastatin Cardiology believes most consistent with demand/mismatch   CKD 3a- Cr worsening today to 1.96 in setting of end of life - no longer monitoring   Chronic afib-  - discontinue apixaban - no rate control medications at this time and is rate controlled   Frailty  History of cerebrovascular accident (CVA) with residual deficit Has residual left hemiparesis and tremors  Hypothyroidism - discontinue levothyroxine   Depression Continue sertraline   Age-related osteoporosis without current pathological fracture No concerning issues this admission  Body mass index is 24.47 kg/m.  VTE ppx: apixaban (ELIQUIS) tablet 2.5 mg Start: 08/12/23 2230 apixaban (ELIQUIS) tablet 2.5 mg   Diet:     Diet   Diet heart healthy/carb modified Room service appropriate? Yes; Fluid consistency: Thin   Consultants: Cardiology   Subjective 08/16/23    Patient does not wake up to voice and family asks that she be left to sleep since she was awake all night. She is grimacing currently and appears uncomfortable but normal WOB.   Objective   Vitals:   08/15/23 2036 08/16/23 0300 08/16/23 0741 08/16/23 0800  BP:   121/70 104/75  Pulse:  98 87 93  Resp:   11 (!) 22  Temp:  98.3 F (36.8 C) 98.3 F (36.8 C)   TempSrc:  Oral Oral   SpO2: 96% 96% 98% 94%  Weight:      Height:        Intake/Output Summary (Last 24 hours) at 08/16/2023 0804 Last data filed at 08/16/2023 0519 Gross per 24 hour  Intake 840 ml  Output 1100 ml  Net -260 ml   Filed Weights   08/12/23 1653 08/14/23 0500 08/15/23 0414  Weight: 49.9 kg 51.8 kg 51.3 kg    Physical Exam:  General: asleep, does not wake to voice. Grimacing.   Respiratory: normal respiratory effort. Shallow breaths. HHFNC in place Cardiovascular: quick capillary refill Nervous:  Asleep Extremities: no edema, normal tone Skin: dry, intact, normal temperature, normal color. No rashes, lesions or ulcers on exposed skin  Labs   I have personally reviewed the following labs and imaging studies CBC    Component Value Date/Time   WBC 4.3 08/13/2023 0400   RBC 5.29 (H) 08/13/2023 0400   HGB 13.1 08/13/2023 0400   HGB 13.4 06/15/2014 0346   HCT 43.5 08/13/2023 0400   HCT 40.1 06/15/2014 0346   PLT 165 08/13/2023 0400   PLT 206 06/15/2014 0346   MCV 82.2 08/13/2023 0400   MCV 94 06/15/2014 0346   MCH 24.8 (L) 08/13/2023 0400   MCHC 30.1 08/13/2023 0400   RDW 21.6 (H) 08/13/2023 0400   RDW 13.3 06/15/2014 0346   LYMPHSABS 1.6 08/03/2023 1506   LYMPHSABS 1.4 06/15/2014 0346   MONOABS 0.5 08/03/2023 1506   MONOABS 0.6 06/15/2014 0346   EOSABS 0.0 08/03/2023 1506   EOSABS 0.2 06/15/2014 0346   BASOSABS 0.0 08/03/2023 1506   BASOSABS 0.0 06/15/2014 0346      Latest Ref Rng & Units 08/15/2023    5:33 AM 08/14/2023    4:58 AM 08/13/2023  4:00 AM  BMP  Glucose 70 - 99 mg/dL 244  010  272   BUN 8 - 23 mg/dL 68  61  59   Creatinine 0.44 - 1.00 mg/dL 5.36  6.44  0.34   Sodium 135 - 145 mmol/L 137  137  141   Potassium 3.5 - 5.1 mmol/L 4.5  4.8  4.8   Chloride 98 - 111 mmol/L 105  104  110   CO2 22 - 32 mmol/L 21  20  23    Calcium 8.9 - 10.3 mg/dL 8.6  8.7  8.7     DG Chest Port 1 View Result Date: 08/15/2023 CLINICAL DATA:  Hypoxia EXAM: PORTABLE CHEST - 1 VIEW COMPARISON:  08/12/2023 FINDINGS: Cardiomediastinal silhouette and pulmonary vasculature are within normal limits. Mild bibasilar atelectasis is present.  Lung are otherwise clear. IMPRESSION: No acute cardiopulmonary process. Electronically Signed   By: Acquanetta Belling M.D.   On: 08/15/2023 21:20     Disposition Plan & Communication  Patient status: Inpatient  Admitted From: SNF Planned disposition location: expectant  Anticipated discharge date: hours to days  Family Communication: daughter and son  in law at bedside    Author: Leeroy Bock, DO [facility] 08/16/2023, 8:04 AM   Available by Epic secure chat 7AM-7PM. If 7PM-7AM, please contact night-coverage.  [facility] contact information found on [email or web address].

## 2023-08-16 NOTE — Progress Notes (Signed)
 [facility] Cardiology  SUBJECTIVE: Patient laying in bed, sleeping   Vitals:   08/15/23 2036 08/16/23 0300 08/16/23 0741 08/16/23 0800  BP:   121/70 104/75  Pulse:  98 87 93  Resp:   11 (!) 22  Temp:  98.3 F (36.8 C) 98.3 F (36.8 C)   TempSrc:  Oral Oral   SpO2: 96% 96% 98% 94%  Weight:      Height:         Intake/Output Summary (Last 24 hours) at 08/16/2023 1610 Last data filed at 08/16/2023 9604 Gross per 24 hour  Intake 600 ml  Output 1100 ml  Net -500 ml      PHYSICAL EXAM  General: Well developed, well nourished, in no acute distress HEENT:  Normocephalic and atramatic Neck:  No JVD.  Lungs: Clear bilaterally to auscultation and percussion. Heart: HRRR . Normal S1 and S2 without gallops or murmurs.  Abdomen: Bowel sounds are positive, abdomen soft and non-tender  Msk:  Back normal, normal gait. Normal strength and tone for age. Extremities: No clubbing, cyanosis or edema.   Neuro: Alert and oriented X 3. Psych:  Good affect, responds appropriately   LABS: Basic Metabolic Panel: Recent Labs    08/15/23 0533 08/16/23 0839  NA 137 137  K 4.5 4.3  CL 105 104  CO2 21* 21*  GLUCOSE 134* 166*  BUN 68* 77*  CREATININE 1.58* 1.96*  CALCIUM 8.6* 8.9   Liver Function Tests: No results for input(s): "AST", "ALT", "ALKPHOS", "BILITOT", "PROT", "ALBUMIN" in the last 72 hours. No results for input(s): "LIPASE", "AMYLASE" in the last 72 hours. CBC: No results for input(s): "WBC", "NEUTROABS", "HGB", "HCT", "MCV", "PLT" in the last 72 hours. Cardiac Enzymes: No results for input(s): "CKTOTAL", "CKMB", "CKMBINDEX", "TROPONINI" in the last 72 hours. BNP: Invalid input(s): "POCBNP" D-Dimer: No results for input(s): "DDIMER" in the last 72 hours. Hemoglobin A1C: No results for input(s): "HGBA1C" in the last 72 hours. Fasting Lipid Panel: No results for input(s): "CHOL", "HDL", "LDLCALC", "TRIG", "CHOLHDL", "LDLDIRECT" in the last 72 hours. Thyroid Function Tests: No  results for input(s): "TSH", "T4TOTAL", "T3FREE", "THYROIDAB" in the last 72 hours.  Invalid input(s): "FREET3" Anemia Panel: No results for input(s): "VITAMINB12", "FOLATE", "FERRITIN", "TIBC", "IRON", "RETICCTPCT" in the last 72 hours.  DG Chest Port 1 View Result Date: 08/15/2023 CLINICAL DATA:  Hypoxia EXAM: PORTABLE CHEST - 1 VIEW COMPARISON:  08/12/2023 FINDINGS: Cardiomediastinal silhouette and pulmonary vasculature are within normal limits. Mild bibasilar atelectasis is present.  Lung are otherwise clear. IMPRESSION: No acute cardiopulmonary process. Electronically Signed   By: Acquanetta Belling M.D.   On: 08/15/2023 21:20     Echo EF 40-45% 07/31/2023  TELEMETRY: Atrial fibrillation 98 bpm:  ASSESSMENT AND PLAN:  Principal Problem:   Acute on chronic respiratory failure with hypoxia and hypercapnia (HCC) Active Problems:   Hypothyroidism   Chest pain   Elevated troponin   Acute renal failure superimposed on stage 3a chronic kidney disease (HCC)   Depression   Chronic atrial fibrillation (HCC)   Age-related osteoporosis without current pathological fracture   Dilated idiopathic cardiomyopathy (HCC)   COPD with acute exacerbation (HCC)   History of cerebrovascular accident (CVA) with residual deficit   Acute on chronic HFrEF (heart failure with reduced ejection fraction) (HCC)   Frailty    1. Acute on chronic HFmrEF, EF 40-45% 07/31/2023, mild peripheral edema, on furosemide 40 mg IV twice daily, BUN and creatinine stable 2.  Chronic atrial fibrillation, controlled heart rate,  on Eliquis for stroke prevention 3.  COPD exacerbation, respiratory failure, on IV steroids and multiple inhalers, on high flow nasal cannula, episode of respiratory distress last evening, exacerbated by anxiety   Recommendations   1.  Agree with current therapy 2.  Continue diuresis 3.  Carefully monitor renal status 4.  Continue low-dose Eliquis for stroke prevention 5.  Agree with palliative care  consult   Marcina Millard, MD, PhD, [facility] 08/16/2023 9:38 AM

## 2023-08-16 NOTE — Progress Notes (Addendum)
 NAME:  Jacqueline [name], MRN:  [id number], DOB:  1932/09/22, LOS: 4 ADMISSION DATE:  08/12/2023, CONSULTATION DATE: 01/Mar/2025  REFERRING MD: Jamelle Rushing, MD  CHIEF COMPLAINT: Inability to wean down O2, acute on chronic respiratory failure with hypoxia  History of Present Illness:  Patient is an 89 year old very remote former smoker recently evaluated at [facility] [place] (Dr. West Bali Assaker) for issues with debilitating dyspnea and worsening oxygen requirement in the setting of chronic respiratory failure with hypoxia.  She has a very complex medical history as noted below.  She has been hospitalized 4 times since September 2024.  At that time she had COVID-19.  Per her daughter, her oxygen requirements have increased significantly since that COVID-19 episode.  She used to have a requirement of 2 L/min nasal cannula now that requirement at home had been 5 L/min.  She has had issues with recurrent bouts of combined systolic and diastolic heart failure, and per daughter for episodes of poorly controlled atrial fib with RVR in the weeks prior to admission.  She presented on 12 August 2023 with increasing shortness of breath and with oxygen requirement increased to 10 L/min via bubble high flow in the ED.  She was admitted with a working diagnosis of COPD with acute exacerbation.  Of note she had had recent change on her nebulizer therapy at home and DuoNeb was added replacing and Brovana.  The patient also had undergone increased dose of Lasix from her primary care physician.  The patient is on anticoagulation for her chronic atrial fibrillation.  We are being asked to evaluate the patient due to increasing O2 requirements and inability to wean off of high flow oxygen.  The patient cannot provide complete history due to the severity of her current condition.  The patient is exceedingly debilitated.  She cannot state that she is short of breath and that she feels uncomfortable.  Per the  patient's daughter she has not had any fevers or chills.  There is evidence that she may be intermittently aspirating.  I also, conferred with the patient's daughter and stressed that the patient has multiple comorbidities to include chronic heart failure, chronic respiratory failure and advanced age, debility and frailty and increasing oxygen requirements prior to admission are suggestive of very poor long-term prognosis.  Agree with palliative care consultation which has been placed.   Pertinent  Medical Hi  Nonischemic CM LVEF 40-45% Chronic Atrial fib Chronic respiratory failure with hypoxia Chronic combined systolic and diastolic HF RV dysfunction per recent echos "Mild" Pulmonary HTN (suspect underestimated) Emphysema (likely senescent) - noted on CT Hypothyroidism Cerebrovascular Disease, S/P CVA (residual L hemiparesis and tremors) CKD 3b   Significant Hospital Events: Including procedures, antibiotic start and stop dates in addition to other pertinent events   02/26 Admitted with acute on chronic resp failure 03/01 PCCM consulted due to inability to wean off of high flow O2 03/02 "Bad night". Renal function deteriorating  Interim History / Subjective:  Asleep, on 100% FiO2 @ 50 Lpm flow.Family requested patient not be awakened, limited exam.  Objective   Blood pressure 104/75, pulse 93, temperature 98.3 F (36.8 C), temperature source Oral, resp. rate (!) 22, height 4\' 9"  (1.448 m), weight 51.3 kg, SpO2 94%.    FiO2 (%):  [75 %-100 %] 100 %   Intake/Output Summary (Last 24 hours) at 08/16/2023 0947 Last data filed at 08/16/2023 0519 Gross per 24 hour  Intake 600 ml  Output 1100 ml  Net -500 ml  Filed Weights   08/12/23 1653 08/14/23 0500 08/15/23 0414  Weight: 49.9 kg 51.8 kg 51.3 kg   SpO2: 94 % O2 Flow Rate (L/min): 50 L/min FiO2 (%): 100 %  Examination: GENERAL: Very debilitated [place] woman, chronically ill-appearing,resting, supine in bed.  Heated nasal  cannula in place.   HEAD: Normocephalic, atraumatic.  EYES:  MOUTH: Poor dentition, oral mucosa moist. NECK: Supple. No thyromegaly. Trachea midline. No JVD.  PULMONARY: Good air entry bilaterally.  No rhonchi appreciated but exam was limited. CARDIOVASCULAR: S1 and S2.  Irregularly irregular, rate controlled.  No overt murmurs heard. ABDOMEN: Non distended. MUSCULOSKELETAL: Osteoarthritis changes of both hands, no clubbing, 2+ anasarca.  NEUROLOGIC: Left hemiparesis. Pt. sleeping SKIN: Intact,warm,dry.   Reviewed imaging  02/26 CT chest w/o contrast no aucte changes. Emphysema, scattered granulomas.  03/01 CXR, no infiltrate or overt CHF:    Assessment & Plan:  Acute on chronic respiratory failure with hypoxia Poor cough mechanics with retained secretions Etiology:? Emphysema, ? PH Purulent sputum noted once, not  Continue supplemental oxygen with high flow O2 wean for sats of 88 to 92% Adjusted nebulization treatments Avoid antimuscarinic agents due to drying effect on secretions Trial of Mucomyst Vest physiotherapy for secretion management as tolerated Consider speech pathology evaluation query chronic aspiration (history of prior CVA) Patient does not have bronchospasm, consider taper of steroids quickly Not candidate for BiPAP due to aspiration risk, normal PaCO2, poor cough mechanics and debility  Emphysema likely senescent On Brovana Continue Pulmicort In this situation avoid antimuscarinic agents due to tenacious secretions PRN Xopenex  Acute on chronic combined systolic and diastolic heart failure RV dysfunction on echocardiogram Query significant pulmonary artery hypertension This may be underestimated by echo Difficult to manage as RV will be volume dependent Worsening renal function, consider holding off diuretics  Chronic atrial fibrillation Continue anticoagulation  Acute on chronic renal failure Monitor renal function and electrolytes Avoid  nephrotoxic drugs Consider withholding diuretics short-term  Advanced age, frailty and debility Adult failure to thrive Bodes for poor prognosis PT/OT assessment as able Agree with palliative care consultation  End-of-life discussion Discussed with her daughter and son-in-law Patient is declining significantly despite supportive therapy Recommend transitioning to comfort care  Labs   CBC: Recent Labs  Lab 08/12/23 1604 08/13/23 0400  WBC 7.6 4.3  HGB 13.4 13.1  HCT 44.8 43.5  MCV 82.5 82.2  PLT 178 165    Basic Metabolic Panel: Recent Labs  Lab 08/12/23 1604 08/13/23 0400 08/14/23 0458 08/15/23 0533 08/16/23 0839  NA 139 141 137 137 137  K 4.7 4.8 4.8 4.5 4.3  CL 109 110 104 105 104  CO2 22 23 20* 21* 21*  GLUCOSE 103* 169* 161* 134* 166*  BUN 60* 59* 61* 68* 77*  CREATININE 1.77* 1.60* 1.77* 1.58* 1.96*  CALCIUM 8.8* 8.7* 8.7* 8.6* 8.9   GFR: Estimated Creatinine Clearance: 13.7 mL/min (A) (by C-G formula based on SCr of 1.96 mg/dL (H)). Recent Labs  Lab 08/12/23 1604 08/13/23 0400  WBC 7.6 4.3    Liver Function Tests: Recent Labs  Lab 08/13/23 0400  AST 31  ALT 16  ALKPHOS 64  BILITOT 1.1  PROT 6.9  ALBUMIN 3.5   Results for orders placed or performed during the hospital encounter of 08/12/23  Resp panel by RT-PCR (RSV, Flu A&B, Covid) Anterior Nasal Swab     Status: None   Collection Time: 08/12/23 10:31 PM   Specimen: Anterior Nasal Swab  Result Value Ref Range Status  SARS Coronavirus 2 by RT PCR NEGATIVE NEGATIVE Final    Comment: (NOTE) SARS-CoV-2 target nucleic acids are NOT DETECTED.  The SARS-CoV-2 RNA is generally detectable in upper respiratory specimens during the acute phase of infection. The lowest concentration of SARS-CoV-2 viral copies this assay can detect is 138 copies/mL. A negative result does not preclude SARS-Cov-2 infection and should not be used as the sole basis for treatment or other patient management  decisions. A negative result may occur with  improper specimen collection/handling, submission of specimen other than nasopharyngeal swab, presence of viral mutation(s) within the areas targeted by this assay, and inadequate number of viral copies(<138 copies/mL). A negative result must be combined with clinical observations, patient history, and epidemiological information. The expected result is Negative.  Fact Sheet for Patients:  BloggerCourse.com  Fact Sheet for Healthcare Providers:  SeriousBroker.it  This test is no t yet approved or cleared by the [place] FDA and  has been authorized for detection and/or diagnosis of SARS-CoV-2 by FDA under an Emergency Use Authorization (EUA). This EUA will remain  in effect (meaning this test can be used) for the duration of the COVID-19 declaration under Section 564(b)(1) of the Act, 21 U.S.C.section 360bbb-3(b)(1), unless the authorization is terminated  or revoked sooner.       Influenza A by PCR NEGATIVE NEGATIVE Final   Influenza B by PCR NEGATIVE NEGATIVE Final    Comment: (NOTE) The Xpert Xpress SARS-CoV-2/FLU/RSV plus assay is intended as an aid in the diagnosis of influenza from Nasopharyngeal swab specimens and should not be used as a sole basis for treatment. Nasal washings and aspirates are unacceptable for Xpert Xpress SARS-CoV-2/FLU/RSV testing.  Fact Sheet for Patients: BloggerCourse.com  Fact Sheet for Healthcare Providers: SeriousBroker.it  This test is not yet approved or cleared by the [place] FDA and has been authorized for detection and/or diagnosis of SARS-CoV-2 by FDA under an Emergency Use Authorization (EUA). This EUA will remain in effect (meaning this test can be used) for the duration of the COVID-19 declaration under Section 564(b)(1) of the Act, 21 U.S.C. section 360bbb-3(b)(1), unless the  authorization is terminated or revoked.     Resp Syncytial Virus by PCR NEGATIVE NEGATIVE Final    Comment: (NOTE) Fact Sheet for Patients: BloggerCourse.com  Fact Sheet for Healthcare Providers: SeriousBroker.it  This test is not yet approved or cleared by the [place] FDA and has been authorized for detection and/or diagnosis of SARS-CoV-2 by FDA under an Emergency Use Authorization (EUA). This EUA will remain in effect (meaning this test can be used) for the duration of the COVID-19 declaration under Section 564(b)(1) of the Act, 21 U.S.C. section 360bbb-3(b)(1), unless the authorization is terminated or revoked.  Performed at [facility], [address] [place], [place] [address]   Respiratory (~20 pathogens) panel by PCR     Status: None   Collection Time: 08/14/23  8:00 AM   Specimen: Nasopharyngeal Swab; Respiratory  Result Value Ref Range Status   Adenovirus NOT DETECTED NOT DETECTED Final   Coronavirus 229E NOT DETECTED NOT DETECTED Final    Comment: (NOTE) The Coronavirus on the Respiratory Panel, DOES NOT test for the novel  Coronavirus (2019 nCoV)    Coronavirus HKU1 NOT DETECTED NOT DETECTED Final   Coronavirus NL63 NOT DETECTED NOT DETECTED Final   Coronavirus OC43 NOT DETECTED NOT DETECTED Final   Metapneumovirus NOT DETECTED NOT DETECTED Final   Rhinovirus / Enterovirus NOT DETECTED NOT DETECTED Final   Influenza  A NOT DETECTED NOT DETECTED Final   Influenza B NOT DETECTED NOT DETECTED Final   Parainfluenza Virus 1 NOT DETECTED NOT DETECTED Final   Parainfluenza Virus 2 NOT DETECTED NOT DETECTED Final   Parainfluenza Virus 3 NOT DETECTED NOT DETECTED Final   Parainfluenza Virus 4 NOT DETECTED NOT DETECTED Final   Respiratory Syncytial Virus NOT DETECTED NOT DETECTED Final   Bordetella pertussis NOT DETECTED NOT DETECTED Final   Bordetella Parapertussis NOT DETECTED NOT DETECTED Final    Chlamydophila pneumoniae NOT DETECTED NOT DETECTED Final   Mycoplasma pneumoniae NOT DETECTED NOT DETECTED Final    Comment: Performed at [facility], [address], [place], [place] [address]     ABG    Component Value Date/Time   PHART 7.36 07/30/2023 2022   PCO2ART 32 07/30/2023 2022   PO2ART 88 07/30/2023 2022   HCO3 27.8 08/03/2023 1521   ACIDBASEDEF 6.3 (H) 07/30/2023 2022   O2SAT 26.3 08/03/2023 1521     Coagulation Profile: No results for input(s): "INR", "PROTIME" in the last 168 hours.  Cardiac Enzymes: No results for input(s): "CKTOTAL", "CKMB", "CKMBINDEX", "TROPONINI" in the last 168 hours.  HbA1C: Hemoglobin A1C  Date/Time Value Ref Range Status  05/18/2023 12:00 AM 6.4  Final  10/30/2022 12:00 AM 6.5  Final    CBG: Recent Labs  Lab 08/15/23 2058  GLUCAP 152*    Review of Systems:   Unable to obtain due to patient's current condition.  Allergies Allergies  Allergen Reactions   Ciprofloxacin    Digoxin And Related    Entresto [Sacubitril-Valsartan] Other (See Comments)    Hypotensive    Erythromycin    Lopid [Gemfibrozil]    Nitrofurantoin    Spironolactone Other (See Comments)    Hospitalized for dehydration   Cefuroxime Rash   Penicillins Rash    Has patient had a PCN reaction causing immediate rash, facial/tongue/throat swelling, SOB or lightheadedness with hypotension: Unknown Has patient had a PCN reaction causing severe rash involving mucus membranes or skin necrosis: No Has patient had a PCN reaction that required hospitalization: No Has patient had a PCN reaction occurring within the last 10 years: No If all of the above answers are "NO", then may proceed with Cephalosporin use.      Home Medications  Prior to Admission medications   Medication Sig Start Date End Date Taking? Authorizing Provider  acetaminophen (TYLENOL) 325 MG tablet Take 2 tablets (650 mg total) by mouth every 4 (four) hours as needed for headache or  mild pain. 11/29/21  Yes Esaw Grandchild A, DO  alendronate (FOSAMAX) 70 MG tablet Take 70 mg by mouth once a week. Take with a full glass of water on an empty stomach.   Yes [provider]  apixaban (ELIQUIS) 2.5 MG TABS tablet Take 1 tablet (2.5 mg total) by mouth 2 (two) times daily. Start on 11/30/2021 11/29/21  Yes Esaw Grandchild A, DO  ARTIFICIAL TEAR SOLUTION OP Apply 1 drop to eye 2 (two) times daily. Both Eyes.   Yes [provider]  atorvastatin (LIPITOR) 80 MG tablet Take 1 tablet (80 mg total) by mouth daily. 11/30/21  Yes Esaw Grandchild A, DO  budesonide (PULMICORT) 0.25 MG/2ML nebulizer solution Take 2 mLs (0.25 mg total) by nebulization in the morning and at bedtime. 05/06/23  Yes Assaker, West Bali, MD  Cholecalciferol (D3-1000) 25 MCG (1000 UT) capsule Take 1,000 Units by mouth daily.   Yes [provider]  Dextromethorphan-guaiFENesin 20-400 MG TABS Take  1 tablet by mouth 2 (two) times daily.   Yes [provider]  diclofenac Sodium (VOLTAREN) 1 % GEL Apply 2 g topically daily as needed (pain). Apply to back topically every day shift for pain.   Yes [provider]  ferrous sulfate 325 (65 FE) MG EC tablet Take 325 mg by mouth. Every Monday, Wednesday and Friday.   Yes [provider]  furosemide (LASIX) 40 MG tablet Take 40 mg by mouth daily as needed (for 3lbs weight gain overnight or 5lbs in/week).   Yes [provider]  hydrocortisone (ANUSOL-HC) 25 MG suppository Place 25 mg rectally 2 (two) times daily as needed for hemorrhoids or anal itching.   Yes [provider]  levalbuterol (XOPENEX) 1.25 MG/0.5ML nebulizer solution Take 1.25 mg by nebulization every 4 (four) hours as needed for wheezing or shortness of breath. 08/10/23 08/09/24 Yes Assaker, [place] Bali, MD  levothyroxine (SYNTHROID) 50 MCG tablet Take 50 mcg by mouth at bedtime.   Yes [provider]  Nutritional Supplements (,FEEDING  SUPPLEMENT, PROSOURCE PLUS) liquid Take 30 mLs by mouth daily.   Yes [provider]  Nutritional Supplements (ENSURE ENLIVE PO) Take 1 Can by mouth as directed. After meals if she eats less than 50 % due to poor appetite and weight loss s/p covid   Yes [provider]  OXYGEN 4lpm via [place] for SOB continuous.   Yes [provider]  pantoprazole (PROTONIX) 40 MG tablet Take 40 mg by mouth in the morning and at bedtime.   Yes [provider]  polyethylene glycol (MIRALAX / GLYCOLAX) 17 g packet Take 17 g by mouth every other day.   Yes [provider]  potassium chloride (KLOR-CON) 20 MEQ packet Take 20 mEq by mouth daily.   Yes [provider]  senna-docusate (SENOKOT-S) 8.6-50 MG tablet Take 2 tablets by mouth daily.   Yes [provider]  sertraline (ZOLOFT) 50 MG tablet Take 50 mg by mouth daily.   Yes [provider]  torsemide (DEMADEX) 20 MG tablet Take torsemide 20 mg every Monday, Wednesday, Friday and Sunday. Take torsemide 40 mg every Tuesday, Thursday, Saturday 08/03/23  Yes Lurene Shadow, MD  arformoterol (BROVANA) 15 MCG/2ML NEBU Take 2 mLs (15 mcg total) by nebulization in the morning and at bedtime. Patient not taking: Reported on 08/13/2023 05/06/23   Janann Colonel, MD  ipratropium (ATROVENT) 0.02 % nebulizer solution Take 2.5 mLs (0.5 mg total) by nebulization 4 (four) times daily. Patient not taking: Reported on 08/13/2023 08/12/23   Janann Colonel, MD  revefenacin (YUPELRI) 175 MCG/3ML nebulizer solution Take 3 mLs (175 mcg total) by nebulization daily. Patient not taking: Reported on 08/13/2023 08/10/23   Janann Colonel, MD    Scheduled Meds:  acetylcysteine  3 mL Nebulization BID   apixaban  2.5 mg Oral BID   arformoterol  15 mcg Nebulization BID   atorvastatin  80 mg Oral Daily   budesonide (PULMICORT) nebulizer solution  0.5 mg Nebulization BID   dextromethorphan-guaiFENesin  1 tablet  Oral BID   feeding supplement  237 mL Oral BID BM   ferrous sulfate  325 mg Oral Q breakfast   furosemide  40 mg Intravenous BID   levothyroxine  50 mcg Oral Q0600   methylPREDNISolone (SOLU-MEDROL) injection  40 mg Intravenous Q12H   pantoprazole  40 mg Oral BID   polyethylene glycol  17 g Oral QODAY   sertraline  50 mg Oral Daily   Continuous Infusions: PRN Meds:.acetaminophen **  OR** acetaminophen, diclofenac Sodium, HYDROcodone-acetaminophen, levalbuterol, ondansetron **OR** ondansetron (ZOFRAN) IV, mouth rinse   Level 3 F/U    I discussed the case with Dr. Jamelle Rushing. Updated patient's daughter and son-in-law at bedside.  Overall long-term prognosis is poor.  Recommend transitioning to comfort measures.    I spent 50 minutes of dedicated to the care of this patient on the date of this encounter to include pre-visit review of records, face-to-face time with the patient/family discussing conditions above, post visit ordering of testing, clinical documentation with the electronic health record, making appropriate referrals as documented, and communicating necessary findings to members of the patients care team.   C. Danice Goltz, MD Advanced Bronchoscopy PCCM [facility]    *This note was generated using voice recognition software/Dragon and/or AI transcription program.  Despite best efforts to proofread, errors can occur which can change the meaning. Any transcriptional errors that result from this process are unintentional and may not be fully corrected at the time of dictation.

## 2023-08-16 NOTE — Progress Notes (Signed)
 PT Cancellation Note  Patient Details Name: Jacqueline [name] MRN: [id number] DOB: May 19, 1933   Cancelled Treatment:    Reason Eval/Treat Not Completed: Other (comment). Per family at pt door, pt finally fell asleep after a rough night. PT to re-attempt as able to allow pt to rest.   Olga Coaster PT, DPT 9:47 AM,08/16/23

## 2023-08-16 NOTE — Plan of Care (Signed)

## 2023-08-17 DIAGNOSIS — J9622 Acute and chronic respiratory failure with hypercapnia: Secondary | ICD-10-CM | POA: Diagnosis not present

## 2023-08-17 DIAGNOSIS — J9621 Acute and chronic respiratory failure with hypoxia: Secondary | ICD-10-CM | POA: Diagnosis not present

## 2023-08-17 DIAGNOSIS — Z515 Encounter for palliative care: Secondary | ICD-10-CM | POA: Diagnosis not present

## 2023-08-17 MED ORDER — MORPHINE BOLUS VIA INFUSION
1.0000 mg | INTRAVENOUS | Status: DC | PRN
  Administered 2023-08-17: 1 mg via INTRAVENOUS

## 2023-08-17 MED ORDER — LORAZEPAM 2 MG/ML IJ SOLN
1.0000 mg | Freq: Four times a day (QID) | INTRAMUSCULAR | Status: DC | PRN
  Administered 2023-08-17: 1 mg via INTRAVENOUS
  Filled 2023-08-17: qty 1

## 2023-08-17 MED ORDER — MORPHINE 100MG IN NS 100ML (1MG/ML) PREMIX INFUSION
1.0000 mg/h | INTRAVENOUS | Status: DC
Start: 2023-08-17 — End: 2023-08-17
  Administered 2023-08-17: 5 mg/h via INTRAVENOUS
  Filled 2023-08-17: qty 100

## 2023-08-24 ENCOUNTER — Encounter: Payer: TRICARE For Life (TFL) | Admitting: Family

## 2023-09-04 ENCOUNTER — Encounter: Payer: TRICARE For Life (TFL) | Admitting: Family

## 2023-09-15 NOTE — Progress Notes (Signed)
   08/24/2023 1600  Spiritual Encounters  Type of Visit Follow up;Initial  Care provided to: [facility] present during encounter Nurse  Referral source Nurse (RN/NT/LPN)  Reason for visit End-of-life  OnCall Visit No   Nurse stopped chaplain in the hall and asked if I could check on the family of patient who just transitioned. Chaplain met with family and offered them words of comfort. Family thanked chaplain for coming to be with them.

## 2023-09-15 NOTE — Consult Note (Addendum)
 Consultation Note Date: 09/04/2023 at 1245  Patient Name: Jacqueline [name]  DOB: 1933-05-22  MRN: [id number]  Age / Sex: 89 y.o., female  PCP: Earnestine Mealing, MD Referring Physician: Leeroy Bock, MD  HPI/Patient Profile: 89 y.o. female  with past medical history of NSTEMI 11/2021, T2DM, paroxysmal atrial fibrillation on Eliquis, CVA 11/2021, HTN, hypothyroidism, anemia, COPD on home O2 at 4 L, HFrEF with improved EF secondary to NICM, (EF 40-45% 07/31/23, down from 50% 02/2023 but previously 20-25% 2023), and recently hospitalized from 2/13 to 08/03/2023 with respiratory failure attributed to acute on chronic HFrEF, improved with high-dose Lasix  admitted on 08/12/2023 with worsening respiratory failure, requiring 10 L of high flow nasal cannula.  Patient is being treated for acute on chronic respiratory failure with hypoxia and hypercapnia AE COPD.  Cardiology and pulmonary were consulted.    Patient remained on high levels of oxygen during hospitalization.    3/2: Patient had desaturations with minimal movement in bed, requiring maximum noninvasive oxygen support.    As per chart review, attending spoke with family on 3/2 and patient transitioned to full comfort measures at that time.  PMT was consulted and is supporting patient/family for end-of-life care/comfort measures.  Clinical Assessment and Goals of Care: Extensive chart review completed prior to meeting patient including labs, vital signs, imaging, progress notes, orders, and available advanced directive documents from current and previous encounters. I then met with patient, her daughter, and her husband at bedside.  Introduced myself as a member of the palliative medicine team, whose goal is to support patient and family's living with serious illness.  I shared my goal is to be supportive of patient and family.  Patient's son-in-law at bedside  shared "there is not much to be done at this point".  Symptoms assessed. Patient appears to be actively dying. She has drooping of the nasolabial folds, mild hyperextension of the neck, agonal breathing, and no response to verbal or physical stimuli.  Breaths taken appear to be that patient is gasping for air, struggling to breathe, and experiencing air hunger.  Her brow is furrowed and she is making mild moaning sounds.  Counseled with RN and attending Dr. Dareen Piano.  Patient has just received morphine with little relief of respiratory distress.    I recommend morphine gtt to better manage patient's EOL symptoms. Dr. Dareen Piano in agreement.  Order placed for morphine gtt with ability toi bolus from infusion bag. Additionally, I ordered Ativan as needed for anxiety and terminal agitation. Order adjusted to reflect full comfort measures.   Therapeutic silence and emotional support provided to patient and family.  While making adjustments to chart just outside of the room, I was summoned back to patient's room.  RN at bedside believed patient had passed but patient continues to have faint respirations.  I anticipate in-hospital death.  Primary Decision Maker NEXT OF KIN  Physical Exam Vitals reviewed.  Constitutional:      General: She is in acute distress.     Appearance: She is  ill-appearing.  HENT:     Head: Normocephalic and atraumatic.  Cardiovascular:     Rate and Rhythm: Bradycardia present.  Pulmonary:     Comments: Agonal breathing, air hunger Skin:    Coloration: Skin is cyanotic.  Psychiatric:        Behavior: Behavior is not agitated.     Palliative Assessment/Data: 10%     Thank you for this consult. Palliative medicine will continue to follow and assist holistically.   Time Total: 75 minutes  Time spent includes: Detailed review of medical records (labs, imaging, vital signs), medically appropriate exam (mental status, respiratory, cardiac, skin), discussed with  treatment team, counseling and educating patient, family and staff, documenting clinical information, medication management and coordination of care.  Signed by: Georgiann Cocker, DNP, FNP-BC Palliative Medicine   Please contact Palliative Medicine Team providers via [facility] for questions and concerns.

## 2023-09-15 NOTE — Consult Note (Signed)
 [facility] Liaison Note  08/27/2023  Jacqueline [name] [birth year] [id number]  Location: RN Hospital Liaison screened the patient remotely at [facility].  Insurance: Medicare   [name] is a 89 y.o. female who is a Primary Care Patient of Earnestine Mealing, MD-[facility]. The patient was screened for 30 day readmission hospitalization with noted high risk score for unplanned readmission risk with 3 IP/1 ED in 6 months.  The patient was assessed for potential Care Management service needs for post hospital transition for care coordination. Review of patient's electronic medical record reveals patient was admitted for Acute on Chronic Respiratory failure with hypoxia and hypercapnia. Pt is a long-term resident at [facility]. If pt returns to the facility the facility will continue to address her needs. No anticipated needs for VBCI to address at this time.   VBCI Care Management/Population Health does not replace or interfere with any arrangements made by the Inpatient Transition of Care team.   For questions contact:   Elliot Cousin, RN, BSN Hospital Liaison [place]   [facility], Population Health Office Hours MTWF  8:00 am-6:00 pm Direct Dial: [phone] mobile [email or web address]

## 2023-09-15 NOTE — Progress Notes (Signed)
 PROGRESS NOTE  Jacqueline [name]    DOB: Jul 15, 1932, 89 y.o.  ZOX:[id number]    Code Status: Do not attempt resuscitation (DNR) - Comfort care   DOA: 08/12/2023   LOS: 5   Brief hospital course  MALLARY KREGER is a 89 y.o. female with medical history significant for NSTEMI 11/2021, T2DM, paroxysmal atrial fibrillation on Eliquis, CVA 11/2021, HTN, hypothyroidism, anemia, COPD on home O2 at 4 L, HFrEF with improved EF secondary to NICM, (EF 40-45% 07/31/23, down from 50% 02/2023 but previously 20-25% 2023), and recently hospitalized from 2/13 to 08/03/2023 with respiratory failure attributed to acute on chronic HFrEF, improved with high-dose Lasix. They are presenting with worsening respiratory failure, requiring 10 L high flow nasal cannula in the ED, now being attributed to COPD.  ED course: HR 70s to 80s on arrival, respirations in the teens to low 20s, O2 sat 87% on 4 L subsequently requiring up to 15 L HFNC to maintain sats in the high end 90s subsequently down titrated to 10 L HFNC by admission with sats in the mid 90s.normal CBC Troponin of 37 (was in the 50s last admission) BNP 1847 (up from 1263 a couple weeks prior) Creatinine 1.77 up from 1.55 UA with large leukocytes EKG: a flutter with variable AV block at a rate of 78 with T wave inversion in lateral leads CT chest with contrast showing no acute abnormality and otherwise stable findings from prior exams  2/27-08/15/23 -remains on high level of oxygen. Appears comfortable. Lung example remarkable for shallow breaths. Rate controlled on monitor. Cardiology following. Pulmonology consulted  3/2: patient has desaturations with even minimal movement in bed. Requiring maximum non-invasive oxygen support. Pulmonology consult, Dr. Jayme Cloud believes due to elevated pulmonary pressure and long-standing R and L HF worsening.  Still awaiting palliative consult.  Patient's daughter (hcpoa) and son in law at bedside discussed her prognosis and we are  having ongoing discussion on how they wish to go about with withdrawing life-prolonging treatments. Patient continues on comfort care.   Assessment & Plan  Principal Problem:   Acute on chronic respiratory failure with hypoxia and hypercapnia (HCC) Active Problems:   COPD with acute exacerbation (HCC)   Chest pain   Elevated troponin   Dilated idiopathic cardiomyopathy (HCC)   Acute on chronic HFrEF (heart failure with reduced ejection fraction) (HCC)   Acute renal failure superimposed on stage 3a chronic kidney disease (HCC)   Chronic atrial fibrillation (HCC)   Frailty   History of cerebrovascular accident (CVA) with residual deficit   Hypothyroidism   Depression   Age-related osteoporosis without current pathological fracture   Acute on chronic respiratory failure with hypoxia and hypercapnia  Suspect related to combination of COPD and L and R HF, superimposed on underlying baseline frailty, physical deconditioning and advanced age CTA chest was nonacute, resp panel negative.  No improvement at maximum amount of support without cure or treatment. Family has been informed this is terminal and she was transitioned to comfort care 3/2. Ongoing discussions on how family would like to wean from her oxygen support. They would like to complete end of life care in hospital.  - pulmonology signed off  GOC- transitioning to comfort care 3/2 - morphine started for apparent discomfort and ppx air hunger. Oxygen will be weaned as tolerated.  - transferred to comfort care floor - awaiting palliative consult which I think will be beneficial in discussing goals of oxygen dependence and withdrawal with family members.    Acute  on chronic HFrEF Nonischemic cardiomyopathy, EF 40 to 45% 07/31/2023 EF 40-45% 07/31/23, down from 50% 02/2023 but previously 20-25% 2023, BNP remains elevated at 1847, up from 1263 at discharge a couple days prior, however CTA chest not showing pulmonary congestion. Does not  have overt hypervolemia on exam - continue lasix per family request   Chest pain  Elevated troponin.  History of NSTEMI 11/2021 - discontinue atorvastatin Cardiology believes most consistent with demand/mismatch   CKD 3a- Cr worsening to 1.96 in setting of end of life - no longer monitoring   Chronic afib-  - discontinue apixaban - no rate control medications at this time and is rate controlled   Frailty  History of cerebrovascular accident (CVA) with residual deficit Has residual left hemiparesis and tremors  Hypothyroidism - discontinue levothyroxine   Depression Continue sertraline   Age-related osteoporosis without current pathological fracture No concerning issues this admission  Body mass index is 24.47 kg/m.  VTE ppx: none  Diet:     Diet   Diet regular Room service appropriate? Yes; Fluid consistency: Thin   Consultants: Cardiology   Subjective 09/07/2023    Patient opens eyes to touch and falls back asleep. She overall looks comfortable.    Objective   Vitals:   08/16/23 0900 08/16/23 1000 08/16/23 1158 08/16/23 2008  BP: (!) 119/98 130/77 120/73 102/63  Pulse: (!) 101 91 93 89  Resp: 17 (!) 24 18 16   Temp:   98.3 F (36.8 C) 98.4 F (36.9 C)  TempSrc:      SpO2: 92% 97% 94% (!) 89%  Weight:      Height:        Intake/Output Summary (Last 24 hours) at 09/12/2023 0714 Last data filed at 09/03/2023 0540 Gross per 24 hour  Intake 337 ml  Output 1125 ml  Net -788 ml   Filed Weights   08/12/23 1653 08/14/23 0500 08/15/23 0414  Weight: 49.9 kg 51.8 kg 51.3 kg    Physical Exam:  General: asleep, does not wake to voice. Appears comfortable.  Respiratory: normal respiratory effort. Shallow breaths.  Hfnc in place Nervous: Asleep Extremities: no edema, normal tone Skin: dry, intact, normal temperature, normal color. No rashes, lesions or ulcers on exposed skin  Labs   I have personally reviewed the following labs and imaging studies CBC     Component Value Date/Time   WBC 4.3 08/13/2023 0400   RBC 5.29 (H) 08/13/2023 0400   HGB 13.1 08/13/2023 0400   HGB 13.4 06/15/2014 0346   HCT 43.5 08/13/2023 0400   HCT 40.1 06/15/2014 0346   PLT 165 08/13/2023 0400   PLT 206 06/15/2014 0346   MCV 82.2 08/13/2023 0400   MCV 94 06/15/2014 0346   MCH 24.8 (L) 08/13/2023 0400   MCHC 30.1 08/13/2023 0400   RDW 21.6 (H) 08/13/2023 0400   RDW 13.3 06/15/2014 0346   LYMPHSABS 1.6 08/03/2023 1506   LYMPHSABS 1.4 06/15/2014 0346   MONOABS 0.5 08/03/2023 1506   MONOABS 0.6 06/15/2014 0346   EOSABS 0.0 08/03/2023 1506   EOSABS 0.2 06/15/2014 0346   BASOSABS 0.0 08/03/2023 1506   BASOSABS 0.0 06/15/2014 0346      Latest Ref Rng & Units 08/16/2023    8:39 AM 08/15/2023    5:33 AM 08/14/2023    4:58 AM  BMP  Glucose 70 - 99 mg/dL 295  621  308   BUN 8 - 23 mg/dL 77  68  61   Creatinine 0.44 -  1.00 mg/dL 1.91  4.78  2.95   Sodium 135 - 145 mmol/L 137  137  137   Potassium 3.5 - 5.1 mmol/L 4.3  4.5  4.8   Chloride 98 - 111 mmol/L 104  105  104   CO2 22 - 32 mmol/L 21  21  20    Calcium 8.9 - 10.3 mg/dL 8.9  8.6  8.7     DG Chest Port 1 View Result Date: 08/15/2023 CLINICAL DATA:  Hypoxia EXAM: PORTABLE CHEST - 1 VIEW COMPARISON:  08/12/2023 FINDINGS: Cardiomediastinal silhouette and pulmonary vasculature are within normal limits. Mild bibasilar atelectasis is present.  Lung are otherwise clear. IMPRESSION: No acute cardiopulmonary process. Electronically Signed   By: Acquanetta Belling M.D.   On: 08/15/2023 21:20     Disposition Plan & Communication  Patient status: Inpatient  Admitted From: SNF Planned disposition location: expectant  Anticipated discharge date: hours to days  Family Communication: daughter and son in law at bedside    Author: Leeroy Bock, DO [facility] 08/24/2023, 7:14 AM   Available by Epic secure chat 7AM-7PM. If 7PM-7AM, please contact night-coverage.  [facility] contact information found on  [email or web address].

## 2023-09-15 NOTE — Plan of Care (Signed)
 Patient has been transition to comfort care.  Discussed goals of care with RT and patient's bedside RN.  PCCM will sign off.  Please reconsult as needed.  Gailen Shelter, MD Advanced Bronchoscopy PCCM [facility]-

## 2023-09-15 NOTE — Death Summary Note (Signed)
 DEATH SUMMARY   Patient Details  Name: Jacqueline [name] MRN: [id number] DOB: Jul 30, 1932 WUJ:[id number], Jacqueline Spar, MD Admission/Discharge Information   Admit Date:  04-Sep-2023  Date of Death: Date of Death: September 09, 2023  Time of Death: Time of Death: September 16, 1599  Length of Stay: 5   Principle Cause of death: respiratory failure  Hospital Diagnoses: Principal Problem:   Acute on chronic respiratory failure with hypoxia and hypercapnia (HCC) Active Problems:   COPD with acute exacerbation (HCC)   Chest pain   Elevated troponin   Dilated idiopathic cardiomyopathy (HCC)   Acute on chronic HFrEF (heart failure with reduced ejection fraction) (HCC)   Acute renal failure superimposed on stage 3a chronic kidney disease (HCC)   Chronic atrial fibrillation (HCC)   Frailty   History of cerebrovascular accident (CVA) with residual deficit   Hypothyroidism   Depression   Age-related osteoporosis without current pathological fracture   Hospital Course: Jacqueline [name] is a 89 y.o. female with medical history significant for NSTEMI 11/2021, T2DM, paroxysmal atrial fibrillation on Eliquis, CVA 11/2021, HTN, hypothyroidism, anemia, COPD on home O2 at 4 L, HFrEF with improved EF secondary to NICM, (EF 40-45% 07/31/23, down from 50% 02/2023 but previously 20-25% 09-15-2021), and recently hospitalized from 2/13 to 08/03/2023 with respiratory failure attributed to acute on chronic HFrEF, improved with high-dose Lasix. They are presenting with worsening respiratory failure, requiring 10 L high flow nasal cannula in the ED.   ED course: HR 70s to 80s on arrival, respirations in the teens to low 20s, O2 sat 87% on 4 L subsequently requiring up to 15 L HFNC to maintain sats in the high end 90s subsequently down titrated to 10 L HFNC by admission with sats in the mid 90s.normal CBC Troponin of 37 (was in the 50s last admission) BNP 1847 (up from 1263 a couple weeks prior) Creatinine 1.77 up from 1.55 UA with large  leukocytes EKG: a flutter with variable AV block at a rate of 78 with T wave inversion in lateral leads CT chest with contrast showing no acute abnormality and otherwise stable findings from prior exams   2/27-08/15/23 -remains on high level of oxygen. Appears comfortable. Lung example remarkable for shallow breaths. Rate controlled on monitor. Cardiology following. Pulmonology consulted. Treated for progressive heart failure and COPD exacerbation.    3/2: patient has desaturations with even minimal movement in bed. Requiring maximum non-invasive oxygen support. Pulmonology consult, Dr. Jayme Cloud believes due to elevated pulmonary pressure and long-standing R and L HF worsening.  She was transitioned to comfort care.   3/3: oxygen was weaned as she was treated with morphine for air hunger. Palliative consulted and met with family.  Patient passed away with family at bedside.    Procedures: none   Consultations: pulmonology, palliative, cardiology   The results of significant diagnostics from this hospitalization (including imaging, microbiology, ancillary and laboratory) are listed below for reference.   Significant Diagnostic Studies: DG Chest Port 1 View Result Date: 08/15/2023 CLINICAL DATA:  Hypoxia EXAM: PORTABLE CHEST - 1 VIEW COMPARISON:  09/04/2023 FINDINGS: Cardiomediastinal silhouette and pulmonary vasculature are within normal limits. Mild bibasilar atelectasis is present.  Lung are otherwise clear. IMPRESSION: No acute cardiopulmonary process. Electronically Signed   By: Acquanetta Belling M.D.   On: 08/15/2023 21:20   CT Chest Wo Contrast Result Date: 2023-09-04 CLINICAL DATA:  Chronic shortness of breath EXAM: CT CHEST WITHOUT CONTRAST TECHNIQUE: Multidetector CT imaging of the chest was performed following the standard protocol without IV  contrast. RADIATION DOSE REDUCTION: This exam was performed according to the departmental dose-optimization program which includes automated exposure  control, adjustment of the mA and/or kV according to patient size and/or use of iterative reconstruction technique. COMPARISON:  Chest x-ray from earlier in the same day, CT from 11/25/2021 FINDINGS: Cardiovascular: Somewhat limited due to lack of IV contrast. Atherosclerotic calcifications are identified without aneurysmal dilatation of the aorta. Coronary calcifications are seen. The heart is not significantly enlarged in size. No pericardial effusion is noted. Mediastinum/Nodes: Thoracic inlet is within normal limits. No hilar or mediastinal adenopathy is noted. The esophagus as visualized is within normal limits. Lungs/Pleura: Diffuse emphysematous changes are noted. Scattered calcified granulomas are noted bilaterally. Stable 5 mm nodule is noted in the left upper lobe best seen on image number 33 of series 3 unchanged from 2020 and felt to be benign in etiology. Stable noncalcified right lower lobe best seen on image number 54 of series 3 dating back to 2020. No new focal nodules are seen. No focal infiltrate or sizable effusion is seen. Upper Abdomen: Visualized upper abdomen shows no acute abnormality. Musculoskeletal: Degenerative changes of the thoracic spine are noted. Prior T6 vertebral augmentation is noted. IMPRESSION: Scattered calcified and noncalcified nodules stable from multiple previous exams. No further follow-up is recommended. Previously seen ground-glass opacity in the right middle lobe has resolved. No acute abnormality is noted. Aortic Atherosclerosis (ICD10-I70.0) and Emphysema (ICD10-J43.9). Electronically Signed   By: Alcide Clever M.D.   On: 08/12/2023 20:35   DG Chest Portable 1 View Result Date: 08/12/2023 CLINICAL DATA:  Shortness of breath EXAM: PORTABLE CHEST 1 VIEW COMPARISON:  08/03/2023, 06/22/2011 FINDINGS: The heart size and mediastinal contours are within normal limits. Aortic atherosclerosis. No focal airspace consolidation, pleural effusion, or pneumothorax. Stable  chondroid lesion in the proximal left humerus, benign. IMPRESSION: No active disease. Electronically Signed   By: Duanne Guess D.O.   On: 08/12/2023 17:36   DG Chest Port 1 View Result Date: 08/03/2023 CLINICAL DATA:  Shortness of breath.  History of pulmonary edema. EXAM: PORTABLE CHEST 1 VIEW COMPARISON:  Chest radiograph dated 07/30/2023. FINDINGS: No focal consolidation, pleural effusion, or pneumothorax. The cardiac silhouette is within limits. Atherosclerotic calcification of the aorta. Osteopenia with degenerative changes of the spine. No acute osseous pathology. Similar sclerotic lesion of the proximal left humerus. IMPRESSION: No active disease. Electronically Signed   By: Elgie Collard M.D.   On: 08/03/2023 17:55   ECHOCARDIOGRAM COMPLETE Result Date: 07/31/2023    ECHOCARDIOGRAM REPORT   Patient Name:   DORALEE KOCAK Date of Exam: 07/31/2023 Medical Rec #:  [id number]     Height:       57.0 in Accession #:    [id number]    Weight:       114.0 lb Date of Birth:  04-Oct-1932     BSA:          1.416 m Patient Age:    89 years      BP:           103/65 mmHg Patient Gender: F             HR:           86 bpm. Exam Location:  [facility] Procedure: 2D Echo, Cardiac Doppler and Color Doppler (Both Spectral and Color            Flow Doppler were utilized during procedure). Indications:     Acute Respiratory Distress  History:  Patient has prior history of Echocardiogram examinations, most                  recent 11/27/2021. CHF and Cardiomyopathy, Previous Myocardial                  Infarction, COPD and Stroke, Arrythmias:Atrial Fibrillation;                  Risk Factors:Hypertension, Diabetes and Dyslipidemia. CKD.  Sonographer:     Mikki Harbor Referring Phys:  [id number] Andris Baumann Diagnosing Phys: Alwyn Pea MD  Sonographer Comments: Image acquisition challenging due to respiratory motion. IMPRESSIONS  1. Left ventricular ejection fraction, by estimation, is 40 to 45%. The left  ventricle has mildly decreased function. The left ventricle demonstrates global hypokinesis. There is mild asymmetric left ventricular hypertrophy of the inferior and basal segments. Left ventricular diastolic parameters are consistent with Grade I diastolic dysfunction (impaired relaxation).  2. Right ventricular systolic function is moderately reduced. The right ventricular size is moderately enlarged. There is mildly elevated pulmonary artery systolic pressure.  3. The mitral valve is grossly normal. Mild mitral valve regurgitation.  4. The aortic valve is normal in structure. Aortic valve regurgitation is mild. FINDINGS  Left Ventricle: Left ventricular ejection fraction, by estimation, is 40 to 45%. The left ventricle has mildly decreased function. The left ventricle demonstrates global hypokinesis. Strain imaging was not performed. The left ventricular internal cavity  size was normal in size. There is mild asymmetric left ventricular hypertrophy of the inferior and basal segments. Left ventricular diastolic parameters are consistent with Grade I diastolic dysfunction (impaired relaxation). Right Ventricle: The right ventricular size is moderately enlarged. No increase in right ventricular wall thickness. Right ventricular systolic function is moderately reduced. There is mildly elevated pulmonary artery systolic pressure. The tricuspid regurgitant velocity is 2.71 m/s, and with an assumed right atrial pressure of 15 mmHg, the estimated right ventricular systolic pressure is 44.4 mmHg. Left Atrium: Left atrial size was normal in size. Right Atrium: Right atrial size was normal in size. Pericardium: There is no evidence of pericardial effusion. Mitral Valve: The mitral valve is grossly normal. Mild to moderate mitral annular calcification. Mild mitral valve regurgitation. MV peak gradient, 4.9 mmHg. The mean mitral valve gradient is 2.0 mmHg. Tricuspid Valve: The tricuspid valve is normal in structure.  Tricuspid valve regurgitation is mild. Aortic Valve: The aortic valve is normal in structure. Aortic valve regurgitation is mild. Aortic valve mean gradient measures 3.0 mmHg. Aortic valve peak gradient measures 5.8 mmHg. Aortic valve area, by VTI measures 1.85 cm. Pulmonic Valve: The pulmonic valve was normal in structure. Pulmonic valve regurgitation is mild to moderate. Aorta: The ascending aorta was not well visualized. IAS/Shunts: No atrial level shunt detected by color flow Doppler. Additional Comments: 3D imaging was not performed.  LEFT VENTRICLE PLAX 2D LVIDd:         4.30 cm   Diastology LVIDs:         3.30 cm   LV e' lateral:   9.36 cm/s LV PW:         1.30 cm   LV E/e' lateral: 11.3 LV IVS:        1.00 cm LVOT diam:     2.00 cm LV SV:         39 LV SV Index:   28 LVOT Area:     3.14 cm  RIGHT VENTRICLE RV Basal diam:  3.95 cm RV Mid diam:  5.20 cm LEFT ATRIUM             Index        RIGHT ATRIUM           Index LA diam:        4.10 cm 2.89 cm/m   RA Area:     20.10 cm LA Vol (A2C):   62.8 ml 44.34 ml/m  RA Volume:   61.90 ml  43.70 ml/m LA Vol (A4C):   49.0 ml 34.59 ml/m LA Biplane Vol: 56.1 ml 39.61 ml/m  AORTIC VALVE                    PULMONIC VALVE AV Area (Vmax):    1.95 cm     PV Vmax:       0.76 m/s AV Area (Vmean):   1.80 cm     PV Peak grad:  2.3 mmHg AV Area (VTI):     1.85 cm AV Vmax:           120.67 cm/s AV Vmean:          78.367 cm/s AV VTI:            0.212 m AV Peak Grad:      5.8 mmHg AV Mean Grad:      3.0 mmHg LVOT Vmax:         74.83 cm/s LVOT Vmean:        44.833 cm/s LVOT VTI:          0.125 m LVOT/AV VTI ratio: 0.59  AORTA Ao Root diam: 3.20 cm Ao Asc diam:  3.30 cm MITRAL VALVE                TRICUSPID VALVE MV Area (PHT): 3.72 cm     TR Peak grad:   29.4 mmHg MV Area VTI:   1.68 cm     TR Vmax:        271.00 cm/s MV Peak grad:  4.9 mmHg MV Mean grad:  2.0 mmHg     SHUNTS MV Vmax:       1.11 m/s     Systemic VTI:  0.12 m MV Vmean:      65.7 cm/s    Systemic Diam:  2.00 cm MV Decel Time: 204 msec MV E velocity: 106.00 cm/s Alwyn Pea MD Electronically signed by Alwyn Pea MD Signature Date/Time: 07/31/2023/10:02:24 PM    Final    DG Chest 2 View Result Date: 07/30/2023 CLINICAL DATA:  Shortness of breath EXAM: CHEST - 2 VIEW COMPARISON:  X-ray 02/22/2023 and older. FINDINGS: Normal cardiopericardial silhouette with calcified aorta. There is fullness of the hila bilaterally which could be enlarged pulmonary arteries. No pneumothorax, effusion or edema. Film is under penetrated. There is compression deformity of midthoracic spine vertebral level with some augmentation cement, unchanged. Sclerotic lesion along the left proximal humerus is again noted and could be a chondroid lesion. IMPRESSION: Enlarged central pulmonary arteries as on prior. No consolidation or effusion. Stable compression deformity of a midthoracic spine vertebral level with augmentation cement Electronically Signed   By: Karen Kays M.D.   On: 07/30/2023 17:17    Microbiology: Recent Results (from the past 240 hours)  Resp panel by RT-PCR (RSV, Flu A&B, Covid) Anterior Nasal Swab     Status: None   Collection Time: 08/12/23 10:31 PM   Specimen: Anterior Nasal Swab  Result Value Ref Range Status   SARS Coronavirus 2 by RT  PCR NEGATIVE NEGATIVE Final    Comment: (NOTE) SARS-CoV-2 target nucleic acids are NOT DETECTED.  The SARS-CoV-2 RNA is generally detectable in upper respiratory specimens during the acute phase of infection. The lowest concentration of SARS-CoV-2 viral copies this assay can detect is 138 copies/mL. A negative result does not preclude SARS-Cov-2 infection and should not be used as the sole basis for treatment or other patient management decisions. A negative result may occur with  improper specimen collection/handling, submission of specimen other than nasopharyngeal swab, presence of viral mutation(s) within the areas targeted by this assay, and  inadequate number of viral copies(<138 copies/mL). A negative result must be combined with clinical observations, patient history, and epidemiological information. The expected result is Negative.  Fact Sheet for Patients:  BloggerCourse.com  Fact Sheet for Healthcare Providers:  SeriousBroker.it  This test is no t yet approved or cleared by the [place] FDA and  has been authorized for detection and/or diagnosis of SARS-CoV-2 by FDA under an Emergency Use Authorization (EUA). This EUA will remain  in effect (meaning this test can be used) for the duration of the COVID-19 declaration under Section 564(b)(1) of the Act, 21 U.S.C.section 360bbb-3(b)(1), unless the authorization is terminated  or revoked sooner.       Influenza A by PCR NEGATIVE NEGATIVE Final   Influenza B by PCR NEGATIVE NEGATIVE Final    Comment: (NOTE) The Xpert Xpress SARS-CoV-2/FLU/RSV plus assay is intended as an aid in the diagnosis of influenza from Nasopharyngeal swab specimens and should not be used as a sole basis for treatment. Nasal washings and aspirates are unacceptable for Xpert Xpress SARS-CoV-2/FLU/RSV testing.  Fact Sheet for Patients: BloggerCourse.com  Fact Sheet for Healthcare Providers: SeriousBroker.it  This test is not yet approved or cleared by the [place] FDA and has been authorized for detection and/or diagnosis of SARS-CoV-2 by FDA under an Emergency Use Authorization (EUA). This EUA will remain in effect (meaning this test can be used) for the duration of the COVID-19 declaration under Section 564(b)(1) of the Act, 21 U.S.C. section 360bbb-3(b)(1), unless the authorization is terminated or revoked.     Resp Syncytial Virus by PCR NEGATIVE NEGATIVE Final    Comment: (NOTE) Fact Sheet for Patients: BloggerCourse.com  Fact Sheet for Healthcare  Providers: SeriousBroker.it  This test is not yet approved or cleared by the [place] FDA and has been authorized for detection and/or diagnosis of SARS-CoV-2 by FDA under an Emergency Use Authorization (EUA). This EUA will remain in effect (meaning this test can be used) for the duration of the COVID-19 declaration under Section 564(b)(1) of the Act, 21 U.S.C. section 360bbb-3(b)(1), unless the authorization is terminated or revoked.  Performed at [facility], [address] [place], [place] [address]   Respiratory (~20 pathogens) panel by PCR     Status: None   Collection Time: 08/14/23  8:00 AM   Specimen: Nasopharyngeal Swab; Respiratory  Result Value Ref Range Status   Adenovirus NOT DETECTED NOT DETECTED Final   Coronavirus 229E NOT DETECTED NOT DETECTED Final    Comment: (NOTE) The Coronavirus on the Respiratory Panel, DOES NOT test for the novel  Coronavirus (2019 nCoV)    Coronavirus HKU1 NOT DETECTED NOT DETECTED Final   Coronavirus NL63 NOT DETECTED NOT DETECTED Final   Coronavirus OC43 NOT DETECTED NOT DETECTED Final   Metapneumovirus NOT DETECTED NOT DETECTED Final   Rhinovirus / Enterovirus NOT DETECTED NOT DETECTED Final   Influenza A NOT DETECTED NOT DETECTED  Final   Influenza B NOT DETECTED NOT DETECTED Final   Parainfluenza Virus 1 NOT DETECTED NOT DETECTED Final   Parainfluenza Virus 2 NOT DETECTED NOT DETECTED Final   Parainfluenza Virus 3 NOT DETECTED NOT DETECTED Final   Parainfluenza Virus 4 NOT DETECTED NOT DETECTED Final   Respiratory Syncytial Virus NOT DETECTED NOT DETECTED Final   Bordetella pertussis NOT DETECTED NOT DETECTED Final   Bordetella Parapertussis NOT DETECTED NOT DETECTED Final   Chlamydophila pneumoniae NOT DETECTED NOT DETECTED Final   Mycoplasma pneumoniae NOT DETECTED NOT DETECTED Final    Comment: Performed at [facility], [address], [place], [place] [address]    Time spent:  75 minutes  Signed: Leeroy Bock, MD 08/30/2023

## 2023-09-15 DEATH — deceased

## 2023-11-26 ENCOUNTER — Ambulatory Visit: Payer: TRICARE For Life (TFL) | Admitting: Pulmonary Disease
